# Patient Record
Sex: Male | Born: 1945 | Race: Black or African American | Hispanic: No | Marital: Married | Smoking: Former smoker
Health system: Southern US, Community
[De-identification: ages and names within clinical notes are randomized; demographics above are authoritative.]

## PROBLEM LIST (undated history)

## (undated) DIAGNOSIS — C61 Malignant neoplasm of prostate: Secondary | ICD-10-CM

## (undated) DIAGNOSIS — I499 Cardiac arrhythmia, unspecified: Secondary | ICD-10-CM

## (undated) DIAGNOSIS — I456 Pre-excitation syndrome: Secondary | ICD-10-CM

## (undated) DIAGNOSIS — E119 Type 2 diabetes mellitus without complications: Secondary | ICD-10-CM

## (undated) DIAGNOSIS — Z7901 Long term (current) use of anticoagulants: Secondary | ICD-10-CM

## (undated) DIAGNOSIS — D171 Benign lipomatous neoplasm of skin and subcutaneous tissue of trunk: Secondary | ICD-10-CM

## (undated) DIAGNOSIS — I209 Angina pectoris, unspecified: Secondary | ICD-10-CM

## (undated) DIAGNOSIS — I10 Essential (primary) hypertension: Secondary | ICD-10-CM

## (undated) DIAGNOSIS — I4891 Unspecified atrial fibrillation: Secondary | ICD-10-CM

## (undated) DIAGNOSIS — Z978 Presence of other specified devices: Secondary | ICD-10-CM

## (undated) HISTORY — DX: Benign lipomatous neoplasm of skin and subcutaneous tissue of trunk: D17.1

## (undated) HISTORY — DX: Long term (current) use of anticoagulants: Z79.01

## (undated) HISTORY — DX: Essential (primary) hypertension: I10

## (undated) HISTORY — DX: Pre-excitation syndrome: I45.6

---
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
## 2018-07-02 DIAGNOSIS — C61 Malignant neoplasm of prostate: Secondary | ICD-10-CM

## 2018-07-02 HISTORY — PX: CATARACT EXTRACTION: SUR2

## 2018-07-02 HISTORY — DX: Malignant neoplasm of prostate: C61

## 2018-07-02 HISTORY — PX: RADIOACTIVE SEED IMPLANT: SHX5150

## 2019-04-01 LAB — PROTIME-INR

## 2019-04-17 ENCOUNTER — Ambulatory Visit: Payer: Self-pay | Admitting: General Surgery

## 2019-05-04 ENCOUNTER — Telehealth: Payer: Self-pay

## 2019-05-04 NOTE — Telephone Encounter (Signed)
NOTES ON FILE  ° SENT REFERRAL TO SCHEDULING °

## 2019-05-04 NOTE — Telephone Encounter (Signed)
ERROR

## 2019-05-13 ENCOUNTER — Other Ambulatory Visit: Payer: Self-pay | Admitting: Urology

## 2019-05-13 DIAGNOSIS — C61 Malignant neoplasm of prostate: Secondary | ICD-10-CM

## 2019-05-27 ENCOUNTER — Encounter (HOSPITAL_COMMUNITY)
Admission: RE | Admit: 2019-05-27 | Discharge: 2019-05-27 | Disposition: A | Discharge status: Home / Self Care | Payer: Medicare Other | Source: Ambulatory Visit | Attending: Urology | Admitting: Urology

## 2019-05-27 ENCOUNTER — Other Ambulatory Visit: Payer: Self-pay

## 2019-05-27 DIAGNOSIS — C61 Malignant neoplasm of prostate: Secondary | ICD-10-CM | POA: Diagnosis present

## 2019-05-27 MED ORDER — TECHNETIUM TC 99M MEDRONATE IV KIT
22.0000 | PACK | Freq: Once | INTRAVENOUS | Status: AC | PRN
  Administered 2019-05-27: 22 via INTRAVENOUS

## 2019-06-02 ENCOUNTER — Other Ambulatory Visit: Payer: Self-pay | Admitting: Urology

## 2019-06-05 ENCOUNTER — Encounter: Payer: Self-pay | Admitting: Internal Medicine

## 2019-06-05 ENCOUNTER — Ambulatory Visit (INDEPENDENT_AMBULATORY_CARE_PROVIDER_SITE_OTHER): Payer: Medicare Other | Admitting: Internal Medicine

## 2019-06-05 ENCOUNTER — Ambulatory Visit (INDEPENDENT_AMBULATORY_CARE_PROVIDER_SITE_OTHER): Payer: Medicare Other | Admitting: Pharmacist

## 2019-06-05 ENCOUNTER — Other Ambulatory Visit: Payer: Self-pay

## 2019-06-05 VITALS — BP 98/60 | HR 101 | Ht 73.0 in | Wt 164.4 lb

## 2019-06-05 DIAGNOSIS — Z79899 Other long term (current) drug therapy: Secondary | ICD-10-CM | POA: Diagnosis not present

## 2019-06-05 DIAGNOSIS — Z01810 Encounter for preprocedural cardiovascular examination: Secondary | ICD-10-CM

## 2019-06-05 DIAGNOSIS — I4891 Unspecified atrial fibrillation: Secondary | ICD-10-CM

## 2019-06-05 DIAGNOSIS — I456 Pre-excitation syndrome: Secondary | ICD-10-CM | POA: Diagnosis not present

## 2019-06-05 DIAGNOSIS — Z7901 Long term (current) use of anticoagulants: Secondary | ICD-10-CM

## 2019-06-05 DIAGNOSIS — I480 Paroxysmal atrial fibrillation: Secondary | ICD-10-CM | POA: Insufficient documentation

## 2019-06-05 DIAGNOSIS — I10 Essential (primary) hypertension: Secondary | ICD-10-CM

## 2019-06-05 LAB — POCT INR: INR: 1.1 — AB (ref 2.0–3.0)

## 2019-06-05 MED ORDER — APIXABAN 5 MG PO TABS: 5.0000 mg | ORAL_TABLET | Freq: Two times a day (BID) | ORAL | 11 refills | Status: DC

## 2019-06-05 MED ORDER — METOPROLOL SUCCINATE ER 25 MG PO TB24: 25.0000 mg | ORAL_TABLET | Freq: Every day | ORAL | 3 refills | Status: DC

## 2019-06-05 NOTE — Patient Instructions (Addendum)
Medication Instructions:  STOP- NIFEdipine START- Metoprolol Succinate 25 mg by mouth daily  STOP-Warfarin START-Eliquis 5mg  twice daily (every 12 hours)  *If you need a refill on your cardiac medications before your next appointment, please call your pharmacy*  Lab Work: BMET next week  Testing/Procedures: None Ordered  Follow-Up: At [facility], you and your health needs are our priority.  As part of our continuing mission to provide you with exceptional heart care, we have created designated Provider Care Teams.  These Care Teams include your primary Cardiologist (physician) and Advanced Practice Providers (APPs -  Physician Assistants and Nurse Practitioners) who all work together to provide you with the care you need, when you need it.  Your next appointment:   1 month(s) on January 8 @ 3pm  The format for your next appointment:   In Person  Provider:   Raliegh Ip Mali Hilty, MD

## 2019-06-07 ENCOUNTER — Encounter: Payer: Self-pay | Admitting: Internal Medicine

## 2019-06-07 NOTE — Progress Notes (Signed)
OFFICE NOTE  Chief Complaint:  Establish cardiologist   Primary Care Physician: Nathan Rasmussen, MD  HPI:  Nathan [name] is a 73 y.o. 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  He was not followed by cardiologist rather was seen by internal medicine in [place].  He recently moved to the area to be with his daughter.  He also has a history of alcohol abuse in the past which sounded fairly heavy.  Recently was diagnosed with prostate cancer and is considering treatment options.  There is also diabetes and hypertension.  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  His medical providers name was Dr. Sandie [name] at [facility] in [place] - phone # [phone].  He had been maintained on warfarin and reported compliance with the medication however his dose was quite low at only 1 mg daily.  His daughter reports that since she has been taking care of him here in [place] which is for several months he has been alcohol free.  Overall Nathan [name] is without complaints.  He denies any chest pain or worsening shortness of breath.  Recently he was having issues with hypotension and his primary care provider discontinued a number of medications including his beta-blocker.  He remains on lisinopril and nifedipine.  He was previously on Toprol-XL 25 mg daily.  PMHx:  Past Medical History:  Diagnosis Date  . Atrial fibrillation ([place])   . Cancer of prostate ([place])   . Chronic anticoagulation   . Diabetes ([place])   . HTN (hypertension)   . Lipoma of chest wall   . WPW (Wolff-Parkinson-White syndrome)    No prior ablation    Past Surgical History:  Procedure Laterality Date  . CATARACT EXTRACTION      FAMHx:  Family History  Problem Relation Age of Onset  . Cancer - Lung Mother     SOCHx:   reports that he quit smoking about 4 months  ago. His smoking use included cigarettes. He has quit using smokeless tobacco. He reports previous alcohol use. No history on file for drug.  ALLERGIES:  No Known Allergies  ROS: Pertinent items noted in HPI and remainder of comprehensive ROS otherwise negative.  HOME MEDS: Current Outpatient Medications on File Prior to Visit  Medication Sig Dispense Refill  . folic acid (FOLVITE) 1 MG tablet Take 1 tablet by mouth daily.    Marland Kitchen lisinopril (ZESTRIL) 20 MG tablet Take 20 mg by mouth daily.    . magnesium oxide (MAG-OX) 400 MG tablet Take 1 tablet by mouth daily.    . Prenatal Vit-Fe Fumarate-FA (PREPLUS) 27-1 MG TABS Take 1 tablet by mouth daily.    . tamsulosin (FLOMAX) 0.4 MG CAPS capsule Take 0.4 mg by mouth at bedtime.    . vitamin B-12 (CYANOCOBALAMIN) 1000 MCG tablet Take 1,000 mcg by mouth daily.     No current facility-administered medications on file prior to visit.     LABS/IMAGING: No results found for this or any previous visit (from the past 48 hour(s)). No results found.  LIPID PANEL: No results found for: CHOL, TRIG, HDL, CHOLHDL, VLDL, LDLCALC, LDLDIRECT   WEIGHTS: Wt Readings from Last 3 Encounters:  06/05/19 164 lb 6.4 oz (74.6 kg)    VITALS: BP 98/60   Pulse (!) 101   Ht 6\' 1"  (1.854 m)  Wt 164 lb 6.4 oz (74.6 kg)   BMI 21.69 kg/m   EXAM: General appearance: alert, no distress and Thin Neck: no carotid bruit, no JVD and thyroid not enlarged, symmetric, no tenderness/mass/nodules Lungs: clear to auscultation bilaterally Heart: Regular tachycardia, no murmur Abdomen: soft, non-tender; bowel sounds normal; no masses,  no organomegaly Extremities: extremities normal, atraumatic, no cyanosis or edema Pulses: 2+ and symmetric Skin: Skin color, texture, turgor normal. No rashes or lesions Neurologic: Grossly normal Psych: Pleasant  EKG: Sinus tachycardia at 101- personally reviewed  ASSESSMENT: 1. Reported history of A. fib with WPW 2.  Anticoagulated on warfarin 3. History of chronic alcohol abuse-now abstinent 4. Chest wall lipoma 5. Prostate cancer 6. Hypertension 7. Type 2 diabetes-diet controlled, A1c 6.0  PLAN: 1.   Mr. Nathan [name] is establishing cardiac care in [place].  He has a reported history of A. fib with WPW however the EKG is not classic for WPW.  I wonder if he had an A. fib with aberrancy or wide-complex in the past.  He did have a wallet card warning of this.  He has been anticoagulated on warfarin however low dose.  INR today was just over 1 therefore he was getting no benefit of the medication.  This may have been related to dose changes that took into account his alcohol use in the past which was discontinued.  He also likely is eating better and has more likely higher vitamin K levels.  Nonetheless, I think it would be safer for him to be on a direct oral anticoagulant and would recommend starting Eliquis today 5 mg twice daily.  We will reassess his kidney function and adjust the medical dose accordingly.  He is being contemplated for chest wall lipoma resection.  I would consider this elective as he has no impairment with it although it is rather large.  From my standpoint it would be okay for him to discontinue the Eliquis 3 days prior to the procedure and restarted afterwards.  Finally he has a diagnosis of prostate cancer.  They are considering possible radical prostatectomy.  The same recommendations apply for that.  We will try to get records from his primary care provider to see if there is any other additional cardiac history.  Blood pressure is low today.  I advised him to discontinue the Procardia to allow a little higher blood pressure and we will restart low-dose Toprol-XL 25 mg daily to help with his sinus tachycardia and in the event that he has any breakthrough or recurrent A. fib or tachyarrhythmias related to WPW.  Thanks again for the kind referral.  Follow-up with me in 2 months.  Pixie Casino, MD, [facility], [place] Director of the Advanced Lipid Disorders &  Cardiovascular Risk Reduction Clinic Diplomate of the American Board of Clinical Lipidology Attending Cardiologist  Direct Dial: [phone]  Fax: [phone]  Website:  [email or web address] 06/07/2019, 3:05 PM

## 2019-06-10 LAB — BASIC METABOLIC PANEL
BUN/Creatinine Ratio: 16 (ref 10–24)
BUN: 16 mg/dL (ref 8–27)
CO2: 27 mmol/L (ref 20–29)
Calcium: 10.2 mg/dL (ref 8.6–10.2)
Chloride: 105 mmol/L (ref 96–106)
Creatinine, Ser: 1.01 mg/dL (ref 0.76–1.27)
GFR calc Af Amer: 84 mL/min/{1.73_m2} (ref >59)
GFR calc non Af Amer: 72 mL/min/{1.73_m2} (ref >59)
Glucose: 85 mg/dL (ref 65–99)
Potassium: 5 mmol/L (ref 3.5–5.2)
Sodium: 147 mmol/L — ABNORMAL HIGH (ref 134–144)

## 2019-06-12 ENCOUNTER — Telehealth: Payer: Self-pay | Admitting: Internal Medicine

## 2019-06-12 NOTE — Telephone Encounter (Signed)
New message  'Pt c/o medication issue:  1. Name of Medication: Eliquis  2. How are you currently taking this medication (dosage and times per day)? As written  3. Are you having a reaction (difficulty breathing--STAT)? no  4. What is your medication issue? Pharmacy calling to verify if patient is  taking Eliquis and Warfarin

## 2019-06-12 NOTE — Telephone Encounter (Signed)
Pt was switched to Eliquis 5 mg bid at MD appt earlier this month.  LMOM at [facility] to discontinue warfarin rx.

## 2019-06-15 ENCOUNTER — Encounter (HOSPITAL_BASED_OUTPATIENT_CLINIC_OR_DEPARTMENT_OTHER): Payer: Self-pay

## 2019-06-15 ENCOUNTER — Ambulatory Visit (HOSPITAL_BASED_OUTPATIENT_CLINIC_OR_DEPARTMENT_OTHER): Admit: 2019-06-15 | Payer: Medicare Other | Admitting: Urology

## 2019-06-15 SURGERY — CYSTOSCOPY
Anesthesia: General

## 2019-07-07 ENCOUNTER — Other Ambulatory Visit: Payer: Self-pay

## 2019-07-07 ENCOUNTER — Ambulatory Visit
Admission: RE | Admit: 2019-07-07 | Discharge: 2019-07-07 | Disposition: A | Discharge status: Home / Self Care | Payer: Medicare Other | Source: Ambulatory Visit | Attending: Radiation Oncology | Admitting: Radiation Oncology

## 2019-07-07 ENCOUNTER — Encounter: Payer: Self-pay | Admitting: Radiation Oncology

## 2019-07-07 VITALS — Ht 73.0 in | Wt 164.0 lb

## 2019-07-07 DIAGNOSIS — C61 Malignant neoplasm of prostate: Secondary | ICD-10-CM

## 2019-07-07 NOTE — Progress Notes (Signed)
Radiation Oncology         [phone] ________________________________  Initial outpatient Consultation - Conducted via Telephone due to current COVID-19 concerns for limiting patient exposure  Name: Nathan [name] MRN: [id number]  Date: 07/07/2019  DOB: 1946-03-18  [id number], Nathan Munroe, MD  Raynelle Bring, MD   REFERRING PHYSICIAN: Raynelle Bring, MD  DIAGNOSIS: 74 y.o. 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    ICD-10-CM   1. Malignant neoplasm of prostate ([place])  C61     HISTORY OF PRESENT ILLNESS: Nathan [name] is a 74 y.o. male with a diagnosis of prostate cancer. He initially developed acute urinary retention and renal failure while living in [place]. This was treated with foley catheter placement, which remains in place now and he subsequently moved to [place] with his daughter. He presented to Dr. Alinda Money in 03/2019 to establish local urologic care and digital rectal examination performed at that time was abnormal with left-sided firmness/nodularity. A PSA performed that same day was elevated at 12.2.  He had urodynamc studies (UDS) in 03/2019 to further evaluate bladder function and was found to have neurogenic bladder, likely secondary to chronic BPH with BOO.  He and Dr. Alinda Money have discussed the potential for SP tube placement but to date, he has elected to continue with the indwelling foley catheter.  The patient proceeded to transrectal ultrasound with 12 biopsies of the prostate on 04/28/2019.  The prostate volume measured 196.8 cc.  Out of 12 core biopsies, 8 were positive, including all 6 left cores.  The maximum Gleason score was 3+4, and this was seen in all 8 positive cores-- in the left base lateral (PNI), left mid lateral (PNI), left apex lateral, left base (PNI), left mid, left apex (small focus, PNI), right base (small focus, PNI), and right base lateral (small focus, PNI).  A CT A/P was performed on 05/27/2019 for  disease staging which showed extreme prostatomegaly, measuring 8.6 cm, with a dominant hyper-enhancing lesion of the left aspect of the median lobe measuring 2.3 cm but no evidence of visceral metastatic disease. A bone scan performed the same day was negative for osseous metastases.  In early December, he developed painless, intermittent gross hematuria which was evaluated with cystoscopy on 06/16/19 with no findings suspicious for bladder tumor.  The hematuria was felt most likely related to the severe BPH with chronic BOO and indwelling foley catheter irritation.  The patient reviewed the biopsy results with his urologist and he has kindly been referred today for discussion of potential radiation treatment options.  PREVIOUS RADIATION THERAPY: No  PAST MEDICAL HISTORY:  Past Medical History:  Diagnosis Date   Atrial fibrillation (H[facility])    Cancer of prostate ([place])    Chronic anticoagulation    Diabetes ([facility])    HTN (hypertension)    Lipoma of chest wall    WPW (Wolff-Parkinson-White syndrome)    No prior ablation      PAST SURGICAL HISTORY: Past Surgical History:  Procedure Laterality Date   CATARACT EXTRACTION      FAMILY HISTORY:  Family History  Problem Relation Age of Onset   Lung cancer Mother    Cervical cancer Sister    Breast cancer Neg Hx    Pancreatic cancer Neg Hx    Colon cancer Neg Hx     SOCIAL HISTORY:  Social History   Socioeconomic History   Marital status: Married    Spouse name: Not on  file   Number of children: 9   Years of education: Not on file   Highest education level: Not on file  Occupational History   Not on file  Tobacco Use   Smoking status: Former Smoker    Packs/day: 0.50    Years: 60.00    Pack years: 30.00    Types: Cigarettes    Quit date: 01/31/2019    Years since quitting: 0.4   Smokeless tobacco: Former Network engineer and Sexual Activity   Alcohol use: Not Currently   Drug use: Never   Sexual  activity: Not Currently  Other Topics Concern   Not on file  Social History Narrative   Not on file   Social Determinants of Health   Financial Resource Strain:    Difficulty of Paying Living Expenses: Not on file  Food Insecurity:    Worried About Charity fundraiser in the Last Year: Not on file   [employer] of Food in the Last Year: Not on file  Transportation Needs:    Lack of Transportation (Medical): Not on file   Lack of Transportation (Non-Medical): Not on file  Physical Activity:    Days of Exercise per Week: Not on file   Minutes of Exercise per Session: Not on file  Stress:    Feeling of Stress : Not on file  Social Connections:    Frequency of Communication with Friends and Family: Not on file   Frequency of Social Gatherings with Friends and Family: Not on file   Attends Religious Services: Not on file   Active Member of Clubs or Organizations: Not on file   Attends Archivist Meetings: Not on file   Marital Status: Not on file  Intimate Partner Violence:    Fear of Current or Ex-Partner: Not on file   Emotionally Abused: Not on file   Physically Abused: Not on file   Sexually Abused: Not on file    ALLERGIES: Patient has no known allergies.  MEDICATIONS:  Current Outpatient Medications  Medication Sig Dispense Refill   apixaban (ELIQUIS) 5 MG TABS tablet Take 1 tablet (5 mg total) by mouth 2 (two) times daily. 60 tablet 11   folic acid (FOLVITE) 1 MG tablet Take 1 tablet by mouth daily.     ketorolac (ACULAR) 0.4 % SOLN Place 1 drop into the right eye 4 (four) times daily.     lisinopril (ZESTRIL) 20 MG tablet Take 20 mg by mouth daily.     magnesium oxide (MAG-OX) 400 MG tablet Take 1 tablet by mouth daily.     metoprolol succinate (TOPROL XL) 25 MG 24 hr tablet Take 1 tablet (25 mg total) by mouth daily. 90 tablet 3   Multiple Vitamins-Minerals (MULTIVITAMIN ADULT PO) Take by mouth.     ofloxacin (OCUFLOX) 0.3 %  ophthalmic solution Place 1 drop into the right eye 4 (four) times daily.     prednisoLONE acetate (PRED FORTE) 1 % ophthalmic suspension Place 1 drop into the right eye 4 (four) times daily.     vitamin B-12 (CYANOCOBALAMIN) 1000 MCG tablet Take 1,000 mcg by mouth daily.     VITAMIN D, CHOLECALCIFEROL, PO Take by mouth.     No current facility-administered medications for this encounter.    REVIEW OF SYSTEMS:  On review of systems, the patient reports that he is doing well overall. He denies any chest pain, shortness of breath, cough, fevers, chills, night sweats, unintended weight changes. He denies any bowel  disturbances, and denies abdominal pain, nausea or vomiting. He denies any new musculoskeletal or joint aches or pains. His IPSS was 0 with foley catheter in place. He reports his urine is clear yellow without sediment or mucous, but he adds that there is occasional blood in the collection bag after he showers. His SHIM was 1, indicating he has severe erectile dysfunction. A complete review of systems is obtained and is otherwise negative.  PHYSICAL EXAM:  Wt Readings from Last 3 Encounters:  07/07/19 164 lb (74.4 kg)  06/05/19 164 lb 6.4 oz (74.6 kg)   Temp Readings from Last 3 Encounters:  No data found for Temp   BP Readings from Last 3 Encounters:  06/05/19 98/60   Pulse Readings from Last 3 Encounters:  06/05/19 (!) 101   Pain Assessment Pain Score: 0-No pain/10  Physical exam not performed in light of telephone encounter.   KPS = 90  100 - Normal; no complaints; no evidence of disease. 90   - Able to carry on normal activity; minor signs or symptoms of disease. 80   - Normal activity with effort; some signs or symptoms of disease. 69   - Cares for self; unable to carry on normal activity or to do active work. 60   - Requires occasional assistance, but is able to care for most of his personal needs. 50   - Requires considerable assistance and frequent medical  care. 55   - Disabled; requires special care and assistance. 11   - Severely disabled; hospital admission is indicated although death not imminent. 61   - Very sick; hospital admission necessary; active supportive treatment necessary. 10   - Moribund; fatal processes progressing rapidly. 0     - Dead  Karnofsky DA, Abelmann Erda, Craver LS and Burchenal [facility] [phone]) The use of the nitrogen mustards in the palliative treatment of carcinoma: with particular reference to bronchogenic carcinoma Cancer 1 634-56  LABORATORY DATA:  No results found for: WBC, HGB, HCT, MCV, PLT Lab Results  Component Value Date   NA 147 (H) 06/09/2019   K 5.0 06/09/2019   CL 105 06/09/2019   CO2 27 06/09/2019   No results found for: ALT, AST, GGT, ALKPHOS, BILITOT   RADIOGRAPHY: No results found.    IMPRESSION/PLAN: This visit was conducted via Telephone to spare the patient unnecessary potential exposure in the healthcare setting during the current COVID-19 pandemic. 1. 74 y.o. gentleman with Stage cT2 adenocarcinoma of the prostate with Gleason Score of 3+4, and PSA of 12.2. We discussed the patient's workup and outlined the nature of prostate cancer in this setting. The patient's T stage, Gleason's score, and PSA put him into the intermediate risk group. Accordingly, he is eligible for a variety of potential treatment options including prostatectomy, brachytherapy or 5.5-8 weeks of external radiation. We discussed the available radiation techniques, and focused on the details and logistics of delivery. The patient is not a candidate for brachytherapy with a prostate volume of 196.8 gm.  Therefore we focused our discussion on the risks, benefits, short and long-term effects associated with daily external beam radiotherapy and compared and contrasted these with prostatectomy. We discussed the role of SpaceOAR gel in reducing the rectal toxicity associated with radiotherapy. He reports that they discussed the role of ADT  in the treatment of intermediate risk prostate cancer with Dr. Alinda Money and have decided to proceed with radiation alone at this point and reserve ADT for use only if the PSA continues to rise  despite treatment.  He and his daughter were encouraged to ask questions that were answered to their stated satisfaction.  At the end of the conversation, the patient is interested in moving forward with 5.5 weeks of external beam therapy without ADT. We will share our discussion with Dr. Alinda Money and move forward with coordinating a follow up for fiducial markers and SpaceOAR gel placement, prior to simulation, to reduce rectal toxicity from radiotherapy. He appears to have a good understanding of his disease and our treatment recommendations which are of curative intent and is comfortable and in agreement with the stated plan.  We will proceed with  treatment planning accordingly, in anticipation of beginning IMRT in the near future.  The patient is currently living with his daughter who is working full time and he does not drive himself.  Therefore, they have requested assistance with transportation to and from daily radiation visits so I will connect them with our transportation coordinator to make the appropriate arrangements.  Given current concerns for patient exposure during the COVID-19 pandemic, this encounter was conducted via telephone. The patient was notified in advance and was offered a MyChart meeting to allow for face to face communication but unfortunately reported that he did not have the appropriate resources/technology to support such a visit and instead preferred to proceed with telephone consult. The patient has given verbal consent for this type of encounter. The time spent during this encounter was 60 minutes. The attendants for this meeting include Tyler Pita MD, Ashlyn Bruning PA-C, [place], patient, Wyn Flam, his wife, and his daughter. During the encounter, Tyler Pita MD, Ashlyn Bruning PA-C, and scribe, Wilburn Mylar were located at [place].  Patient, Benz Chance, his wife, and his daughter were located at home.    Nicholos Johns, PA-C    Tyler Pita, MD  [facility] Direct Dial: [phone]   Fax: [phone] [email or web address]   Skype   [email or web address]  This document serves as a record of services personally performed by Tyler Pita, MD and Freeman Caldron, PA-C. It was created on their behalf by Wilburn Mylar, a trained medical scribe. The creation of this record is based on the scribe's personal observations and the provider's statements to them. This document has been checked and approved by the attending provider.

## 2019-07-07 NOTE — Progress Notes (Signed)
GU Location of Tumor / Histology: prostatic adenocarcinoma  If Prostate Cancer, Gleason Score is (3 + 4) and PSA is (12.20). Prostate volume: 196.8 cc  Lakendric Papalia was residing in [place]. Patient's daughter became concerned about his health and travel to [place] on December 2. She found patient unable to void with a distended abdomen. Ambulance called and patient found to be in kidney failure.  Biopsies of prostate (if applicable) revealed:   Past/Anticipated interventions by urology, if any: prostate biopsy, foley management, referral to Dr. [name]  Past/Anticipated interventions by medical oncology, if any: no  Weight changes, if any: down 40 lb in the past year since wife left  Bowel/Bladder complaints, if any: IPSS 0. SHIM 1. Foley catheter in place. Urine clear yellow without sediment or mucous. Occasional blood in collection bag after showers. Denies pain or bladder spasm. Denies any bowel complaints.   Nausea/Vomiting, if any: no  Pain issues, if any:  no  SAFETY ISSUES:  Prior radiation? no  Pacemaker/ICD? no  Possible current pregnancy? no, male patient  Is the patient on methotrexate? no  Current Complaints / other details:

## 2019-07-07 NOTE — Progress Notes (Signed)
See progress note under physician encounter. 

## 2019-07-10 ENCOUNTER — Ambulatory Visit (INDEPENDENT_AMBULATORY_CARE_PROVIDER_SITE_OTHER): Payer: Medicare Other | Admitting: Internal Medicine

## 2019-07-10 ENCOUNTER — Other Ambulatory Visit: Payer: Self-pay

## 2019-07-10 ENCOUNTER — Encounter: Payer: Self-pay | Admitting: Internal Medicine

## 2019-07-10 VITALS — BP 133/73 | HR 88 | Temp 97.1°F | Ht 73.0 in | Wt 169.0 lb

## 2019-07-10 DIAGNOSIS — I456 Pre-excitation syndrome: Secondary | ICD-10-CM

## 2019-07-10 DIAGNOSIS — Z7901 Long term (current) use of anticoagulants: Secondary | ICD-10-CM

## 2019-07-10 DIAGNOSIS — I10 Essential (primary) hypertension: Secondary | ICD-10-CM

## 2019-07-10 DIAGNOSIS — I4891 Unspecified atrial fibrillation: Secondary | ICD-10-CM | POA: Diagnosis not present

## 2019-07-10 NOTE — Patient Instructions (Signed)
Medication Instructions:  NO CHANGES *If you need a refill on your cardiac medications before your next appointment, please call your pharmacy*  Lab Work: If you have labs (blood work) drawn today and your tests are completely normal, you will receive your results only by: Marland Kitchen MyChart Message (if you have MyChart) OR . A paper copy in the mail If you have any lab test that is abnormal or we need to change your treatment, we will call you to review the results.  Follow-Up: At [facility], you and your health needs are our priority.  As part of our continuing mission to provide you with exceptional heart care, we have created designated Provider Care Teams.  These Care Teams include your primary Cardiologist (physician) and Advanced Practice Providers (APPs -  Physician Assistants and Nurse Practitioners) who all work together to provide you with the care you need, when you need it.  Your next appointment:   6 month(s)  The format for your next appointment:   Either In Person or Virtual  Provider:   You may see No primary care provider on file. or one of the following Advanced Practice Providers on your designated Care Team:    Almyra Deforest, PA-C  Fabian Sharp, PA-C or   Roby Lofts, [place]

## 2019-07-10 NOTE — Progress Notes (Signed)
OFFICE NOTE  Chief Complaint:  Follow-up A. Fib  Primary Care Physician: [name], MD  HPI:  Nathan [name] is a 74 y.o. 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  He was not followed by cardiologist rather was seen by internal medicine in [place].  He recently moved to the area to be with his daughter.  He also has a history of alcohol abuse in the past which sounded fairly heavy.  Recently was diagnosed with prostate cancer and is considering treatment options.  There is also diabetes and hypertension.  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  His medical providers name was Dr. Sandie Ano at [facility] in [place] - phone # [phone].  He had been maintained on warfarin and reported compliance with the medication however his dose was quite low at only 1 mg daily.  His daughter reports that since she has been taking care of him here in [place] which is for several months he has been alcohol free.  Overall Mr. Helmich is without complaints.  He denies any chest pain or worsening shortness of breath.  Recently he was having issues with hypotension and his primary care provider discontinued a number of medications including his beta-blocker.  He remains on lisinopril and nifedipine.  He was previously on Toprol-XL 25 mg daily.  07/10/2019  Mr. Laduke returns today for follow-up of his A. fib.  Overall he is doing well now on Eliquis.  He says he does not feel any different on it.  He had no bleeding issues with it.  I switched him to Toprol-XL which he previously taken to help with any possible breakthrough of WPW or A. fib.  I have also taken off of the Procardia.  Blood pressure is well controlled today 133/73.  He is working with both urology and oncology as far as treatment for diagnosed prostate cancer.  He is likely  getting get radiation seed implants.  He is also going to be deferring his lipoma resection.  If necessary at this point he could stop his Eliquis 2 to 3 days prior to that and restarted afterwards when bleeding risk is low.  PMHx:  Past Medical History:  Diagnosis Date  . Atrial fibrillation ([place])   . Cancer of prostate ([place])   . Chronic anticoagulation   . Diabetes ([place])   . HTN (hypertension)   . Lipoma of chest wall   . WPW (Wolff-Parkinson-White syndrome)    No prior ablation    Past Surgical History:  Procedure Laterality Date  . CATARACT EXTRACTION      FAMHx:  Family History  Problem Relation Age of Onset  . Lung cancer Mother   . Cervical cancer Sister   . Breast cancer Neg Hx   . Pancreatic cancer Neg Hx   . Colon cancer Neg Hx     SOCHx:   reports that he quit smoking about 5 months ago. His smoking use included cigarettes. He has a 30.00 pack-year smoking history. He has quit using smokeless tobacco. He reports previous alcohol use. He reports that he does not use drugs.  ALLERGIES:  No Known Allergies  ROS: Pertinent items noted in HPI and remainder of comprehensive ROS otherwise negative.  HOME MEDS: Current Outpatient Medications on File Prior to Visit  Medication Sig Dispense Refill  .  apixaban (ELIQUIS) 5 MG TABS tablet Take 1 tablet (5 mg total) by mouth 2 (two) times daily. 60 tablet 11  . folic acid (FOLVITE) 1 MG tablet Take 1 tablet by mouth daily.    Marland Kitchen ketorolac (ACULAR) 0.4 % SOLN Place 1 drop into the right eye 4 (four) times daily.    Marland Kitchen lisinopril (ZESTRIL) 20 MG tablet Take 20 mg by mouth daily.    . magnesium oxide (MAG-OX) 400 MG tablet Take 1 tablet by mouth daily.    . metoprolol succinate (TOPROL XL) 25 MG 24 hr tablet Take 1 tablet (25 mg total) by mouth daily. 90 tablet 3  . Multiple Vitamins-Minerals (MULTIVITAMIN ADULT PO) Take by mouth.    Marland Kitchen ofloxacin (OCUFLOX) 0.3 % ophthalmic solution Place 1 drop into the right eye 4 (four)  times daily.    . prednisoLONE acetate (PRED FORTE) 1 % ophthalmic suspension Place 1 drop into the right eye 4 (four) times daily.    . vitamin B-12 (CYANOCOBALAMIN) 1000 MCG tablet Take 1,000 mcg by mouth daily.    Marland Kitchen VITAMIN D, CHOLECALCIFEROL, PO Take by mouth.     No current facility-administered medications on file prior to visit.    LABS/IMAGING: No results found for this or any previous visit (from the past 48 hour(s)). No results found.  LIPID PANEL: No results found for: CHOL, TRIG, HDL, CHOLHDL, VLDL, LDLCALC, LDLDIRECT   WEIGHTS: Wt Readings from Last 3 Encounters:  07/10/19 169 lb (76.7 kg)  07/07/19 164 lb (74.4 kg)  06/05/19 164 lb 6.4 oz (74.6 kg)    VITALS: BP 133/73   Pulse 88   Temp (!) 97.1 F (36.2 C)   Ht 6\' 1"  (1.854 m)   Wt 169 lb (76.7 kg)   SpO2 98%   BMI 22.30 kg/m   EXAM: General appearance: alert, no distress and Thin Neck: no carotid bruit, no JVD and thyroid not enlarged, symmetric, no tenderness/mass/nodules Lungs: clear to auscultation bilaterally Heart: Regular tachycardia, no murmur Abdomen: soft, non-tender; bowel sounds normal; no masses,  no organomegaly Extremities: extremities normal, atraumatic, no cyanosis or edema Pulses: 2+ and symmetric Skin: Skin color, texture, turgor normal. No rashes or lesions Neurologic: Grossly normal Psych: Pleasant  EKG: Deferred  ASSESSMENT: 1. Reported history of A. fib with WPW 2. Anticoagulated on Eliquis 3. History of chronic alcohol abuse-now abstinent 4. Chest wall lipoma 5. Prostate cancer 6. Hypertension 7. Type 2 diabetes-diet controlled, A1c 6.0  PLAN: 1.   Mr. Kaczynski seems to be doing well now on Eliquis.  He has rate control at this point.  Blood pressure is well controlled.  He plans to undergo radiation seed implants for prostate cancer may have a chest wall lipoma removed.  Other than that he seems to be doing well.  He can follow-up with me in 6 months or sooner as  necessary.  Pixie Casino, MD, [facility], [place] Director of the Advanced Lipid Disorders &  Cardiovascular Risk Reduction Clinic Diplomate of the American Board of Clinical Lipidology Attending Cardiologist  Direct Dial: [phone]  Fax: [phone]  Website:  [email or web address] 07/10/2019, 3:03 PM

## 2019-07-13 ENCOUNTER — Encounter: Payer: Self-pay | Admitting: Medical Oncology

## 2019-07-21 ENCOUNTER — Telehealth: Payer: Self-pay | Admitting: *Deleted

## 2019-07-21 ENCOUNTER — Telehealth: Payer: Self-pay | Admitting: Medical Oncology

## 2019-07-21 NOTE — Telephone Encounter (Signed)
Returned patient's wife's phone call, spoke with patient's wife- Theda Belfast

## 2019-07-21 NOTE — Telephone Encounter (Signed)
Spoke with [name], daughter to follow up post consult with Dr. [name] 07/07/19.  He has chosen 5 1/2 weeks of radiation. I asked if he has been scheduled for gold markers/SpaceOar with Dr. Alinda Money and she states, no. She is concerned. I will follow up with Enid Derry and call he back with an update. We discussed CT simulation, appointments and what takes place. She was very appreciative of the call and help with getting appointment. I gave her my contact information and asked her to call me with questions or concerns. She voiced understanding.

## 2019-07-23 ENCOUNTER — Telehealth: Payer: Self-pay | Admitting: *Deleted

## 2019-07-23 NOTE — Telephone Encounter (Signed)
Called patient to inform of fid. markers and space oar placement on 08-20-19 @ [facility] and his sim on 08-25-19 @ 11 am @ Dr. Johny She[facility], lvm for a return call

## 2019-07-23 NOTE — Telephone Encounter (Signed)
xxxxx 

## 2019-07-24 ENCOUNTER — Other Ambulatory Visit: Payer: Self-pay | Admitting: Urology

## 2019-07-24 DIAGNOSIS — C61 Malignant neoplasm of prostate: Secondary | ICD-10-CM

## 2019-08-12 ENCOUNTER — Telehealth: Payer: Self-pay | Admitting: *Deleted

## 2019-08-12 NOTE — Telephone Encounter (Signed)
CALLED PATIENT'S DAUGHTER TOYA NATHANIEL TO INFORM THAT MRI FOR HER DAD HAS BEEN MOVED TO 08-26-19- ARRIVAL TIME- 4:30 PM @ [facility], SPOKE WITH PATIENT'S DAUGHTER  AND SHE IS AWARE OF THE CHANGE AND IS GOOD WITH IT.

## 2019-08-21 ENCOUNTER — Ambulatory Visit (HOSPITAL_COMMUNITY): Payer: PRIVATE HEALTH INSURANCE

## 2019-08-24 ENCOUNTER — Telehealth: Payer: Self-pay | Admitting: *Deleted

## 2019-08-24 NOTE — Telephone Encounter (Signed)
CALLED PATIENT'S DAUGHTER - TOYA Lingo TO INFORM OF SIM AND MRI BEING MOVED TO 09-04-19, SPOKE WITH TOYA [place] AND SHE IS AWARE OF THESE APPTS.

## 2019-08-25 ENCOUNTER — Ambulatory Visit: Payer: Medicare Other | Admitting: Radiation Oncology

## 2019-08-26 ENCOUNTER — Ambulatory Visit (HOSPITAL_COMMUNITY): Payer: PRIVATE HEALTH INSURANCE

## 2019-08-31 ENCOUNTER — Encounter: Payer: Self-pay | Admitting: *Deleted

## 2019-09-03 ENCOUNTER — Telehealth: Payer: Self-pay | Admitting: *Deleted

## 2019-09-03 NOTE — Telephone Encounter (Signed)
CALLED PATIENT TO REMIND OF SIM AND MRI FOR 09-04-19, SPOKE WITH PATIENT'S DAUGHTER- TOYA AND SHE IS AWARE OF THESE APPTS.

## 2019-09-04 ENCOUNTER — Ambulatory Visit
Admission: RE | Admit: 2019-09-04 | Discharge: 2019-09-04 | Disposition: A | Discharge status: Home / Self Care | Payer: Medicare Other | Source: Ambulatory Visit | Attending: Radiation Oncology | Admitting: Radiation Oncology

## 2019-09-04 ENCOUNTER — Other Ambulatory Visit: Payer: Self-pay

## 2019-09-04 ENCOUNTER — Ambulatory Visit (HOSPITAL_COMMUNITY)
Admission: RE | Admit: 2019-09-04 | Discharge: 2019-09-04 | Disposition: A | Discharge status: Home / Self Care | Payer: Medicare Other | Source: Ambulatory Visit | Attending: Urology | Admitting: Urology

## 2019-09-04 ENCOUNTER — Encounter: Payer: Self-pay | Admitting: Medical Oncology

## 2019-09-04 DIAGNOSIS — C61 Malignant neoplasm of prostate: Secondary | ICD-10-CM | POA: Diagnosis present

## 2019-09-04 DIAGNOSIS — Z510 Encounter for antineoplastic radiation therapy: Secondary | ICD-10-CM | POA: Insufficient documentation

## 2019-09-04 NOTE — Progress Notes (Signed)
  Radiation Oncology         [phone]) [phone] ________________________________  Name: Nathan [name] MRN: [id number]  Date: 09/04/2019  DOB: [birth year]  SIMULATION AND TREATMENT PLANNING NOTE    ICD-10-CM   1. Malignant neoplasm of prostate ([place])  C61     DIAGNOSIS:  74 y.o. 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  Identity was confirmed.  All relevant records and images related to the planned course of therapy were reviewed.  The patient freely provided informed written consent to proceed with treatment after reviewing the details related to the planned course of therapy. The consent form was witnessed and verified by the simulation staff.  Then, the patient was set-up in a stable reproducible supine position for radiation therapy.  A vacuum lock pillow device was custom fabricated to position his legs in a reproducible immobilized position.  Then, I performed a urethrogram under sterile conditions to identify the prostatic apex.  CT images were obtained.  Surface markings were placed.  The CT images were loaded into the planning software.  Then the prostate target and avoidance structures including the rectum, bladder, bowel and hips were contoured.  Treatment planning then occurred.  The radiation prescription was entered and confirmed.  A total of one complex treatment devices was fabricated. I have requested : Intensity Modulated Radiotherapy (IMRT) is medically necessary for this case for the following reason:  Rectal sparing.Marland Kitchen  PLAN:  The patient will receive 70 Gy in 28 fractions.  ________________________________  Sheral Apley [name], M.D.

## 2019-09-07 DIAGNOSIS — Z510 Encounter for antineoplastic radiation therapy: Secondary | ICD-10-CM | POA: Diagnosis not present

## 2019-09-16 ENCOUNTER — Encounter: Payer: Self-pay | Admitting: Medical Oncology

## 2019-09-16 ENCOUNTER — Other Ambulatory Visit: Payer: Self-pay

## 2019-09-16 ENCOUNTER — Ambulatory Visit
Admission: RE | Admit: 2019-09-16 | Discharge: 2019-09-16 | Disposition: A | Discharge status: Home / Self Care | Payer: Medicare Other | Source: Ambulatory Visit | Attending: Radiation Oncology | Admitting: Radiation Oncology

## 2019-09-16 DIAGNOSIS — Z510 Encounter for antineoplastic radiation therapy: Secondary | ICD-10-CM | POA: Diagnosis not present

## 2019-09-17 ENCOUNTER — Other Ambulatory Visit: Payer: Self-pay

## 2019-09-17 ENCOUNTER — Ambulatory Visit
Admission: RE | Admit: 2019-09-17 | Discharge: 2019-09-17 | Disposition: A | Discharge status: Home / Self Care | Payer: Medicare Other | Source: Ambulatory Visit | Attending: Radiation Oncology | Admitting: Radiation Oncology

## 2019-09-17 DIAGNOSIS — Z510 Encounter for antineoplastic radiation therapy: Secondary | ICD-10-CM | POA: Diagnosis not present

## 2019-09-18 ENCOUNTER — Other Ambulatory Visit: Payer: Self-pay

## 2019-09-18 ENCOUNTER — Ambulatory Visit
Admission: RE | Admit: 2019-09-18 | Discharge: 2019-09-18 | Disposition: A | Discharge status: Home / Self Care | Payer: Medicare Other | Source: Ambulatory Visit | Attending: Radiation Oncology | Admitting: Radiation Oncology

## 2019-09-18 DIAGNOSIS — Z510 Encounter for antineoplastic radiation therapy: Secondary | ICD-10-CM | POA: Diagnosis not present

## 2019-09-21 ENCOUNTER — Ambulatory Visit
Admission: RE | Admit: 2019-09-21 | Discharge: 2019-09-21 | Disposition: A | Discharge status: Home / Self Care | Payer: Medicare Other | Source: Ambulatory Visit | Attending: Radiation Oncology | Admitting: Radiation Oncology

## 2019-09-21 ENCOUNTER — Other Ambulatory Visit: Payer: Self-pay

## 2019-09-21 DIAGNOSIS — Z510 Encounter for antineoplastic radiation therapy: Secondary | ICD-10-CM | POA: Diagnosis not present

## 2019-09-22 ENCOUNTER — Other Ambulatory Visit: Payer: Self-pay

## 2019-09-22 ENCOUNTER — Ambulatory Visit
Admission: RE | Admit: 2019-09-22 | Discharge: 2019-09-22 | Disposition: A | Discharge status: Home / Self Care | Payer: Medicare Other | Source: Ambulatory Visit | Attending: Radiation Oncology | Admitting: Radiation Oncology

## 2019-09-22 DIAGNOSIS — Z510 Encounter for antineoplastic radiation therapy: Secondary | ICD-10-CM | POA: Diagnosis not present

## 2019-09-23 ENCOUNTER — Other Ambulatory Visit: Payer: Self-pay

## 2019-09-23 ENCOUNTER — Ambulatory Visit
Admission: RE | Admit: 2019-09-23 | Discharge: 2019-09-23 | Disposition: A | Discharge status: Home / Self Care | Payer: Medicare Other | Source: Ambulatory Visit | Attending: Radiation Oncology | Admitting: Radiation Oncology

## 2019-09-23 DIAGNOSIS — Z510 Encounter for antineoplastic radiation therapy: Secondary | ICD-10-CM | POA: Diagnosis not present

## 2019-09-24 ENCOUNTER — Ambulatory Visit
Admission: RE | Admit: 2019-09-24 | Discharge: 2019-09-24 | Disposition: A | Discharge status: Home / Self Care | Payer: Medicare Other | Source: Ambulatory Visit | Attending: Radiation Oncology | Admitting: Radiation Oncology

## 2019-09-24 ENCOUNTER — Other Ambulatory Visit: Payer: Self-pay

## 2019-09-24 DIAGNOSIS — Z510 Encounter for antineoplastic radiation therapy: Secondary | ICD-10-CM | POA: Diagnosis not present

## 2019-09-25 ENCOUNTER — Other Ambulatory Visit: Payer: Self-pay

## 2019-09-25 ENCOUNTER — Ambulatory Visit
Admission: RE | Admit: 2019-09-25 | Discharge: 2019-09-25 | Disposition: A | Discharge status: Home / Self Care | Payer: Medicare Other | Source: Ambulatory Visit | Attending: Radiation Oncology | Admitting: Radiation Oncology

## 2019-09-25 DIAGNOSIS — Z510 Encounter for antineoplastic radiation therapy: Secondary | ICD-10-CM | POA: Diagnosis not present

## 2019-09-28 ENCOUNTER — Ambulatory Visit
Admission: RE | Admit: 2019-09-28 | Discharge: 2019-09-28 | Disposition: A | Discharge status: Home / Self Care | Payer: Medicare Other | Source: Ambulatory Visit | Attending: Radiation Oncology | Admitting: Radiation Oncology

## 2019-09-28 ENCOUNTER — Other Ambulatory Visit: Payer: Self-pay

## 2019-09-28 DIAGNOSIS — Z510 Encounter for antineoplastic radiation therapy: Secondary | ICD-10-CM | POA: Diagnosis not present

## 2019-09-29 ENCOUNTER — Other Ambulatory Visit: Payer: Self-pay

## 2019-09-29 ENCOUNTER — Ambulatory Visit
Admission: RE | Admit: 2019-09-29 | Discharge: 2019-09-29 | Disposition: A | Discharge status: Home / Self Care | Payer: Medicare Other | Source: Ambulatory Visit | Attending: Radiation Oncology | Admitting: Radiation Oncology

## 2019-09-29 DIAGNOSIS — Z510 Encounter for antineoplastic radiation therapy: Secondary | ICD-10-CM | POA: Diagnosis not present

## 2019-09-30 ENCOUNTER — Other Ambulatory Visit: Payer: Self-pay

## 2019-09-30 ENCOUNTER — Ambulatory Visit
Admission: RE | Admit: 2019-09-30 | Discharge: 2019-09-30 | Disposition: A | Discharge status: Home / Self Care | Payer: Medicare Other | Source: Ambulatory Visit | Attending: Radiation Oncology | Admitting: Radiation Oncology

## 2019-09-30 DIAGNOSIS — Z510 Encounter for antineoplastic radiation therapy: Secondary | ICD-10-CM | POA: Diagnosis not present

## 2019-10-01 ENCOUNTER — Ambulatory Visit
Admission: RE | Admit: 2019-10-01 | Discharge: 2019-10-01 | Disposition: A | Discharge status: Home / Self Care | Payer: Medicare Other | Source: Ambulatory Visit | Attending: Radiation Oncology | Admitting: Radiation Oncology

## 2019-10-01 ENCOUNTER — Other Ambulatory Visit: Payer: Self-pay

## 2019-10-01 DIAGNOSIS — Z510 Encounter for antineoplastic radiation therapy: Secondary | ICD-10-CM | POA: Diagnosis present

## 2019-10-01 DIAGNOSIS — C61 Malignant neoplasm of prostate: Secondary | ICD-10-CM | POA: Diagnosis present

## 2019-10-02 ENCOUNTER — Ambulatory Visit
Admission: RE | Admit: 2019-10-02 | Discharge: 2019-10-02 | Disposition: A | Discharge status: Home / Self Care | Payer: Medicare Other | Source: Ambulatory Visit | Attending: Radiation Oncology | Admitting: Radiation Oncology

## 2019-10-02 ENCOUNTER — Other Ambulatory Visit: Payer: Self-pay

## 2019-10-02 DIAGNOSIS — Z510 Encounter for antineoplastic radiation therapy: Secondary | ICD-10-CM | POA: Diagnosis not present

## 2019-10-05 ENCOUNTER — Ambulatory Visit
Admission: RE | Admit: 2019-10-05 | Discharge: 2019-10-05 | Disposition: A | Discharge status: Home / Self Care | Payer: Medicare Other | Source: Ambulatory Visit | Attending: Radiation Oncology | Admitting: Radiation Oncology

## 2019-10-05 ENCOUNTER — Other Ambulatory Visit: Payer: Self-pay

## 2019-10-05 DIAGNOSIS — Z510 Encounter for antineoplastic radiation therapy: Secondary | ICD-10-CM | POA: Diagnosis not present

## 2019-10-06 ENCOUNTER — Ambulatory Visit
Admission: RE | Admit: 2019-10-06 | Discharge: 2019-10-06 | Disposition: A | Discharge status: Home / Self Care | Payer: Medicare Other | Source: Ambulatory Visit | Attending: Radiation Oncology | Admitting: Radiation Oncology

## 2019-10-06 ENCOUNTER — Other Ambulatory Visit: Payer: Self-pay

## 2019-10-06 DIAGNOSIS — Z510 Encounter for antineoplastic radiation therapy: Secondary | ICD-10-CM | POA: Diagnosis not present

## 2019-10-07 ENCOUNTER — Ambulatory Visit
Admission: RE | Admit: 2019-10-07 | Discharge: 2019-10-07 | Disposition: A | Discharge status: Home / Self Care | Payer: Medicare Other | Source: Ambulatory Visit | Attending: Radiation Oncology | Admitting: Radiation Oncology

## 2019-10-07 ENCOUNTER — Telehealth: Payer: Self-pay | Admitting: Radiation Oncology

## 2019-10-07 ENCOUNTER — Other Ambulatory Visit: Payer: Self-pay

## 2019-10-07 DIAGNOSIS — Z510 Encounter for antineoplastic radiation therapy: Secondary | ICD-10-CM | POA: Diagnosis not present

## 2019-10-07 NOTE — Telephone Encounter (Signed)
Phoned patient's daughter, [name]. No answer. Left voicemail message detailing that her father will have a PUT encounter on Friday with Dr. [name] immediately following his radiation. Encouraged her to come with him because the treatment team had expressed concern about his confusion. Requested she phone me back to confirm his baseline. Provided my directed number. Awaiting return call.

## 2019-10-08 ENCOUNTER — Ambulatory Visit
Admission: RE | Admit: 2019-10-08 | Discharge: 2019-10-08 | Disposition: A | Discharge status: Home / Self Care | Payer: Medicare Other | Source: Ambulatory Visit | Attending: Radiation Oncology | Admitting: Radiation Oncology

## 2019-10-08 ENCOUNTER — Other Ambulatory Visit: Payer: Self-pay

## 2019-10-08 DIAGNOSIS — Z510 Encounter for antineoplastic radiation therapy: Secondary | ICD-10-CM | POA: Diagnosis not present

## 2019-10-09 ENCOUNTER — Other Ambulatory Visit: Payer: Self-pay

## 2019-10-09 ENCOUNTER — Ambulatory Visit
Admission: RE | Admit: 2019-10-09 | Discharge: 2019-10-09 | Disposition: A | Discharge status: Home / Self Care | Payer: Medicare Other | Source: Ambulatory Visit | Attending: Radiation Oncology | Admitting: Radiation Oncology

## 2019-10-09 DIAGNOSIS — Z510 Encounter for antineoplastic radiation therapy: Secondary | ICD-10-CM | POA: Diagnosis not present

## 2019-10-12 ENCOUNTER — Other Ambulatory Visit: Payer: Self-pay

## 2019-10-12 ENCOUNTER — Ambulatory Visit
Admission: RE | Admit: 2019-10-12 | Discharge: 2019-10-12 | Disposition: A | Discharge status: Home / Self Care | Payer: Medicare Other | Source: Ambulatory Visit | Attending: Radiation Oncology | Admitting: Radiation Oncology

## 2019-10-12 DIAGNOSIS — Z510 Encounter for antineoplastic radiation therapy: Secondary | ICD-10-CM | POA: Diagnosis not present

## 2019-10-13 ENCOUNTER — Other Ambulatory Visit: Payer: Self-pay

## 2019-10-13 ENCOUNTER — Ambulatory Visit
Admission: RE | Admit: 2019-10-13 | Discharge: 2019-10-13 | Disposition: A | Discharge status: Home / Self Care | Payer: Medicare Other | Source: Ambulatory Visit | Attending: Radiation Oncology | Admitting: Radiation Oncology

## 2019-10-13 DIAGNOSIS — Z510 Encounter for antineoplastic radiation therapy: Secondary | ICD-10-CM | POA: Diagnosis not present

## 2019-10-13 NOTE — Progress Notes (Signed)
While patient was receiving his radiation therapy for the day his daughter, [name], presented to the rad onc nursing clinic requesting to speak with Dr. [name]. Explained that Dr. [name] is in surgery and offered to answer or relay any questions she may have. [name] reports that she is simply looking for an update on her father and questions "if the cancer is shrinking." Explained that by all accounts her father is responding well to radiation therapy. Explained the radiation will continue to work in his system for a few weeks after completion thus a PSA will not be collected until 3 months status post treatment. Explained the PSA at the three month mark is most reflective of how well her father has responded to the radiation therapy. She questions when his foley will come out because he "has had it in for seven months." Explained foley removal will be up to the urologist but for now I would suspect he will have it for the duration of radiation therapy and at least a month thereafter. She reports her father has turned the corner and fighting for his life now. She reports he showers daily, goes to the gym and has abstains from cigarettes and alcohol since August. Provided her my business card and encouraged she call me with needs. Explained Imodium is safe for her father to use to manage diarrhea. Encouraged she phone this RN should her father decide he needs a script for the bladder spasms he is having. [name] confirms all her questions have been answered by this RN and she no longer desire to receive a call from Dr. [name]. She request to be placed on speaker phone for future PUT encounters.

## 2019-10-14 ENCOUNTER — Ambulatory Visit
Admission: RE | Admit: 2019-10-14 | Discharge: 2019-10-14 | Disposition: A | Discharge status: Home / Self Care | Payer: Medicare Other | Source: Ambulatory Visit | Attending: Radiation Oncology | Admitting: Radiation Oncology

## 2019-10-14 ENCOUNTER — Other Ambulatory Visit: Payer: Self-pay

## 2019-10-14 DIAGNOSIS — Z510 Encounter for antineoplastic radiation therapy: Secondary | ICD-10-CM | POA: Diagnosis not present

## 2019-10-15 ENCOUNTER — Other Ambulatory Visit: Payer: Self-pay

## 2019-10-15 ENCOUNTER — Ambulatory Visit
Admission: RE | Admit: 2019-10-15 | Discharge: 2019-10-15 | Disposition: A | Discharge status: Home / Self Care | Payer: Medicare Other | Source: Ambulatory Visit | Attending: Radiation Oncology | Admitting: Radiation Oncology

## 2019-10-15 DIAGNOSIS — Z510 Encounter for antineoplastic radiation therapy: Secondary | ICD-10-CM | POA: Diagnosis not present

## 2019-10-16 ENCOUNTER — Other Ambulatory Visit: Payer: Self-pay

## 2019-10-16 ENCOUNTER — Ambulatory Visit
Admission: RE | Admit: 2019-10-16 | Discharge: 2019-10-16 | Disposition: A | Discharge status: Home / Self Care | Payer: Medicare Other | Source: Ambulatory Visit | Attending: Radiation Oncology | Admitting: Radiation Oncology

## 2019-10-16 DIAGNOSIS — Z510 Encounter for antineoplastic radiation therapy: Secondary | ICD-10-CM | POA: Diagnosis not present

## 2019-10-19 ENCOUNTER — Ambulatory Visit
Admission: RE | Admit: 2019-10-19 | Discharge: 2019-10-19 | Disposition: A | Discharge status: Home / Self Care | Payer: Medicare Other | Source: Ambulatory Visit | Attending: Radiation Oncology | Admitting: Radiation Oncology

## 2019-10-19 ENCOUNTER — Telehealth: Payer: Self-pay | Admitting: Radiation Oncology

## 2019-10-19 ENCOUNTER — Other Ambulatory Visit: Payer: Self-pay

## 2019-10-19 DIAGNOSIS — Z510 Encounter for antineoplastic radiation therapy: Secondary | ICD-10-CM | POA: Diagnosis not present

## 2019-10-19 NOTE — Telephone Encounter (Signed)
Late entry from 10/16/2019 at 1030. Patient reports during PUT encounter today he noticed an increasing amount of blood in his foley collection bag. Patient denies foley trauma. Patient denies feeling light headed or dizzy presently. Vitals are stable. Phoned patient's daughter, [name], with patient present in the room. Requested she monitor her father's foley collection bag for blood. Explained that if she continues to see blood in his foley collection bag she should contact Dr. Lyman Bishop about Eliquis bid. [name] verbalized understanding of all reviewed.

## 2019-10-20 ENCOUNTER — Other Ambulatory Visit: Payer: Self-pay

## 2019-10-20 ENCOUNTER — Ambulatory Visit
Admission: RE | Admit: 2019-10-20 | Discharge: 2019-10-20 | Disposition: A | Discharge status: Home / Self Care | Payer: Medicare Other | Source: Ambulatory Visit | Attending: Radiation Oncology | Admitting: Radiation Oncology

## 2019-10-20 DIAGNOSIS — Z510 Encounter for antineoplastic radiation therapy: Secondary | ICD-10-CM | POA: Diagnosis not present

## 2019-10-21 ENCOUNTER — Ambulatory Visit
Admission: RE | Admit: 2019-10-21 | Discharge: 2019-10-21 | Disposition: A | Discharge status: Home / Self Care | Payer: Medicare Other | Source: Ambulatory Visit | Attending: Radiation Oncology | Admitting: Radiation Oncology

## 2019-10-21 ENCOUNTER — Other Ambulatory Visit: Payer: Self-pay

## 2019-10-21 DIAGNOSIS — Z510 Encounter for antineoplastic radiation therapy: Secondary | ICD-10-CM | POA: Diagnosis not present

## 2019-10-22 ENCOUNTER — Ambulatory Visit
Admission: RE | Admit: 2019-10-22 | Discharge: 2019-10-22 | Disposition: A | Discharge status: Home / Self Care | Payer: Medicare Other | Source: Ambulatory Visit | Attending: Radiation Oncology | Admitting: Radiation Oncology

## 2019-10-22 ENCOUNTER — Other Ambulatory Visit: Payer: Self-pay

## 2019-10-22 DIAGNOSIS — Z510 Encounter for antineoplastic radiation therapy: Secondary | ICD-10-CM | POA: Diagnosis not present

## 2019-10-23 ENCOUNTER — Other Ambulatory Visit: Payer: Self-pay

## 2019-10-23 ENCOUNTER — Encounter: Payer: Self-pay | Admitting: Radiation Oncology

## 2019-10-23 ENCOUNTER — Encounter: Payer: Self-pay | Admitting: Urology

## 2019-10-23 ENCOUNTER — Encounter: Payer: Self-pay | Admitting: Medical Oncology

## 2019-10-23 ENCOUNTER — Ambulatory Visit
Admission: RE | Admit: 2019-10-23 | Discharge: 2019-10-23 | Disposition: A | Discharge status: Home / Self Care | Payer: Medicare Other | Source: Ambulatory Visit | Attending: Radiation Oncology | Admitting: Radiation Oncology

## 2019-10-23 DIAGNOSIS — Z510 Encounter for antineoplastic radiation therapy: Secondary | ICD-10-CM | POA: Diagnosis not present

## 2019-10-23 NOTE — Progress Notes (Signed)
  Radiation Oncology         [phone] ________________________________  Name: Nathan [name] MRN: [id number]  Date: 10/23/2019  DOB: [birth year]  Chart Note:  This patient completes radiation therapy today for his prostate cancer.  He has been experiencing gross hematuria for the past 2 weeks.  He describes it as red urine which turns the toilet bowl water red, rather than just a pink tinge.  I am optimistic that the hematuria is related to acute radiation effects to the prostate and bladder, which will resolve over the next 1-2 weeks.  However, other etiologies may be contributing to this.  I advised the patient to contact me if his symptoms persist or worsen.  ________________________________  Sheral Apley [name], M.D.

## 2019-11-25 ENCOUNTER — Telehealth: Payer: Self-pay

## 2019-11-25 NOTE — Telephone Encounter (Signed)
Appointment reminder for 5/27/21n unable to get patient on the phone to confirm encounter

## 2019-11-26 ENCOUNTER — Encounter: Payer: Self-pay | Admitting: Urology

## 2019-11-26 ENCOUNTER — Telehealth: Payer: Self-pay

## 2019-11-26 ENCOUNTER — Other Ambulatory Visit: Payer: Self-pay

## 2019-11-26 ENCOUNTER — Ambulatory Visit
Admission: RE | Admit: 2019-11-26 | Discharge: 2019-11-26 | Disposition: A | Discharge status: Home / Self Care | Payer: Medicare Other | Source: Ambulatory Visit | Attending: Urology | Admitting: Urology

## 2019-11-26 DIAGNOSIS — C61 Malignant neoplasm of prostate: Secondary | ICD-10-CM

## 2019-11-26 NOTE — Telephone Encounter (Signed)
Unable to get an answer at the home phone number provided. Spoke with daughter yesterday she stated she would have them to answer the phone. Called multiple times without an answer

## 2019-11-26 NOTE — Telephone Encounter (Signed)
Spoke with daughter she will participate in telephone encounter

## 2019-11-26 NOTE — Progress Notes (Signed)
Follow up appointment for prostate patient. Reports some blood in his urine not a whole lot. Denies any issues with bowels. Patient has a foley catheter which is patient and urine is flowing.

## 2019-11-26 NOTE — Progress Notes (Signed)
Radiation Oncology         [phone] ________________________________  Name: Arvid Degroot MRN: [id number]  Date: 11/26/2019  DOB: [birth year]  Post Treatment Note  CC: [name], MD  Raynelle Bring, MD  Diagnosis:   74 y.o. 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  Interval Since Last Radiation:  5 weeks  09/16/19 - 10/23/19:  The prostate was treated to 70 Gy in 28 fractions of 2.5 Gy each.   Narrative:  I spoke with the patient to conduct his routine scheduled 1 month follow up visit via telephone to spare the patient unnecessary potential exposure in the healthcare setting during the current COVID-19 pandemic.  The patient was notified in advance and gave permission to proceed with this visit format.  He continued with indwelling foley catheter throughout his course of treatment with occasional gross hematuria and bladder spasms but tolerated treatment well in general.                              On review of systems, the patient states that he is doing well overall.  His Foley catheter was recently changed at [facility] on 11/10/2019.  A urine culture was performed at that time and did reveal bacteria so he completed a course of doxycycline.  He continues with intermittent gross hematuria but reports that the bladder spasms are much improved and the blood in the urine is only intermittent at this point.  He denies any recent fevers, chills, night sweats or flank pain.  He reports normal bowel movements and denies abdominal pain or suprapubic discomfort.  He reports a healthy appetite and is maintaining his weight.  Overall, he is pleased with his progress to date.  ALLERGIES:  has No Known Allergies.  Meds: Current Outpatient Medications  Medication Sig Dispense Refill  . apixaban (ELIQUIS) 5 MG TABS tablet Take 1 tablet (5 mg total) by mouth 2 (two) times daily. 60 tablet 11  . folic acid (FOLVITE) 1 MG tablet  Take 1 tablet by mouth daily.    Marland Kitchen ketorolac (ACULAR) 0.4 % SOLN Place 1 drop into the right eye 4 (four) times daily.    Marland Kitchen lisinopril (ZESTRIL) 20 MG tablet Take 20 mg by mouth daily.    . magnesium oxide (MAG-OX) 400 MG tablet Take 1 tablet by mouth daily.    . metoprolol succinate (TOPROL XL) 25 MG 24 hr tablet Take 1 tablet (25 mg total) by mouth daily. 90 tablet 3  . Multiple Vitamins-Minerals (MULTIVITAMIN ADULT PO) Take by mouth.    Marland Kitchen ofloxacin (OCUFLOX) 0.3 % ophthalmic solution Place 1 drop into the right eye 4 (four) times daily.    . prednisoLONE acetate (PRED FORTE) 1 % ophthalmic suspension Place 1 drop into the right eye 4 (four) times daily.    . vitamin B-12 (CYANOCOBALAMIN) 1000 MCG tablet Take 1,000 mcg by mouth daily.    Marland Kitchen VITAMIN D, CHOLECALCIFEROL, PO Take by mouth.     No current facility-administered medications for this encounter.    Physical Findings:  vitals were not taken for this visit.   /Unable to assess due to telephone follow-up visit format.  Lab Findings: No results found for: WBC, HGB, HCT, MCV, PLT   Radiographic Findings: No results found.  Impression/Plan: 1. 74 y.o. 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 He will continue to follow up with urology for ongoing PSA determinations and has an appointment scheduled with Dr. Alinda Money on 12/18/2019 for repeat labs and will see Dr. Alinda Money thereafter. He understands what to expect with regards to PSA monitoring going forward. I will look forward to following his response to treatment via correspondence with urology, and would be happy to continue to participate in his care if clinically indicated. I talked to the patient about what to expect in the future, including his risk for erectile dysfunction and rectal bleeding. I encouraged him to call or return to the office if he has any questions regarding his previous radiation or possible radiation side  effects. He was comfortable with this plan and will follow up as needed.   Today, a comprehensive survivorship care plan and treatment summary was reviewed with the patient today detailing his prostate cancer diagnosis, treatment course, potential late/long-term effects of treatment, appropriate follow-up care with recommendations for the future, and patient education resources.  A copy of this summary, along with a letter will be sent to the patient's primary care provider via fax after today's visit.  2. Cancer screening:  Due to Mr. Caranci history and his age, he should receive screening for skin cancers, colon cancer, and lung cancer.  The information and recommendations are listed on the patient's comprehensive care plan/treatment summary and were reviewed in detail with the patient.     3. Health maintenance and wellness promotion: Mr. Dubs was encouraged to consume 5-7 servings of fruits and vegetables per day. He was provided a copy of the "Nutrition Rainbow" handout, as well as the handout "Take Control of Your Health and [place]" from the [place].  He was also encouraged to engage in moderate to vigorous exercise for 30 minutes per day most days of the week. Information was provided regarding the [facility] fitness program, which is designed for cancer survivors to help them become more physically fit after cancer treatments. We discussed that a healthy BMI is 18.5-24.9 and that maintaining a healthy weight reduces risk of cancer recurrences.  He was instructed to limit his alcohol consumption and continue to abstain from tobacco use.  Lastly, he was encouraged to use sunscreen and wear protective clothing when in the sun.     4. Support services/counseling: It is not uncommon for this period of the patient's cancer care trajectory to be one of many emotions and stressors.  Mr. Alamillo was encouraged to take advantage of our many support services programs,  support groups, and/or counseling in coping with his new life as a cancer survivor after completing anti-cancer treatment.  He was offered support today through active listening and expressive supportive counseling.  He was given information regarding our available services and encouraged to contact me with any questions or for help enrolling in any of our support group/programs.       Nicholos Johns, PA-C

## 2020-01-18 ENCOUNTER — Other Ambulatory Visit: Payer: Self-pay

## 2020-01-18 ENCOUNTER — Encounter: Payer: Self-pay | Admitting: Internal Medicine

## 2020-01-18 ENCOUNTER — Ambulatory Visit (INDEPENDENT_AMBULATORY_CARE_PROVIDER_SITE_OTHER): Payer: Medicare Other | Admitting: Internal Medicine

## 2020-01-18 VITALS — BP 140/78 | HR 70 | Temp 97.0°F | Ht 73.0 in | Wt 177.2 lb

## 2020-01-18 DIAGNOSIS — Z01810 Encounter for preprocedural cardiovascular examination: Secondary | ICD-10-CM

## 2020-01-18 DIAGNOSIS — Z7901 Long term (current) use of anticoagulants: Secondary | ICD-10-CM | POA: Diagnosis not present

## 2020-01-18 DIAGNOSIS — I456 Pre-excitation syndrome: Secondary | ICD-10-CM

## 2020-01-18 DIAGNOSIS — I10 Essential (primary) hypertension: Secondary | ICD-10-CM

## 2020-01-18 DIAGNOSIS — I4891 Unspecified atrial fibrillation: Secondary | ICD-10-CM

## 2020-01-18 NOTE — Progress Notes (Signed)
OFFICE NOTE  Chief Complaint:  Follow-up A. Fib  Primary Care Physician: [name], MD  HPI:  Nathan [name] is a 74 y.o. 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  He was not followed by cardiologist rather was seen by internal medicine in [place].  He recently moved to the area to be with his daughter.  He also has a history of alcohol abuse in the past which sounded fairly heavy.  Recently was diagnosed with prostate cancer and is considering treatment options.  There is also diabetes and hypertension.  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  His medical providers name was Dr. Sandie Ano at [facility] in [place] - phone # [phone].  He had been maintained on warfarin and reported compliance with the medication however his dose was quite low at only 1 mg daily.  His daughter reports that since she has been taking care of him here in [place] which is for several months he has been alcohol free.  Overall Nathan [name] is without complaints.  He denies any chest pain or worsening shortness of breath.  Recently he was having issues with hypotension and his primary care provider discontinued a number of medications including his beta-blocker.  He remains on lisinopril and nifedipine.  He was previously on Toprol-XL 25 mg daily.  07/10/2019  [name] returns today for follow-up of his A. fib.  Overall he is doing well now on Eliquis.  He says he does not feel any different on it.  He had no bleeding issues with it.  I switched him to Toprol-XL which he previously taken to help with any possible breakthrough of WPW or A. fib.  I have also taken off of the Procardia.  Blood pressure is well controlled today 133/73.  He is working with both urology and oncology as far as treatment for diagnosed prostate cancer.  He is likely  getting get radiation seed implants.  He is also going to be deferring his lipoma resection.  If necessary at this point he could stop his Eliquis 2 to 3 days prior to that and restarted afterwards when bleeding risk is low.  01/18/2020  [name] is seen today in follow-up.  He successfully underwent radiation seed implants and is noted per his daughter to have marked decline in his PSA from over 12 down to 3.  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  She says that recently he has been having notable hematuria.  This seems to be somewhat intermittent however said that the urologist wanted to talk with me about managing his Eliquis.  In addition he will likely have to stop the Eliquis for short period of time for the chest wall lipoma.  G shows that he is in sinus rhythm today.  PMHx:  Past Medical History:  Diagnosis Date   Atrial fibrillation ([place])    Cancer of prostate ([place])    Chronic anticoagulation    Diabetes ([place])    HTN (hypertension)    Lipoma of chest wall    WPW (Wolff-Parkinson-White syndrome)    No prior ablation    Past Surgical History:  Procedure Laterality Date   CATARACT EXTRACTION      FAMHx:  Family History  Problem Relation Age of Onset   Lung cancer Mother  Cervical cancer Sister    Breast cancer Neg Hx    Pancreatic cancer Neg Hx    Colon cancer Neg Hx     SOCHx:   reports that he quit smoking about a year ago. His smoking use included cigarettes. He has a 30.00 pack-year smoking history. He has quit using smokeless tobacco. He reports previous alcohol use. He reports that he does not use drugs.  ALLERGIES:  No Known Allergies  ROS: Pertinent items noted in HPI and remainder of comprehensive ROS otherwise negative.  HOME MEDS: Current Outpatient Medications on File Prior to Visit  Medication Sig Dispense Refill   apixaban (ELIQUIS) 5 MG TABS tablet Take 1 tablet (5 mg total) by mouth 2 (two) times daily. 60 tablet 11     folic acid (FOLVITE) 1 MG tablet Take 1 tablet by mouth daily.     lisinopril (ZESTRIL) 20 MG tablet Take 20 mg by mouth daily.     magnesium oxide (MAG-OX) 400 MG tablet Take 1 tablet by mouth daily.     metoprolol succinate (TOPROL XL) 25 MG 24 hr tablet Take 1 tablet (25 mg total) by mouth daily. 90 tablet 3   Multiple Vitamins-Minerals (MULTIVITAMIN ADULT PO) Take by mouth.     ofloxacin (OCUFLOX) 0.3 % ophthalmic solution Place 1 drop into the right eye 4 (four) times daily.     prednisoLONE acetate (PRED FORTE) 1 % ophthalmic suspension Place 1 drop into the right eye 4 (four) times daily.     Prenatal Vit-Fe Fumarate-FA (PRENATAL MULTIVITAMIN) TABS tablet Take 1 tablet by mouth daily at 12 noon.     vitamin B-12 (CYANOCOBALAMIN) 1000 MCG tablet Take 1,000 mcg by mouth daily.     VITAMIN D, CHOLECALCIFEROL, PO Take by mouth.     No current facility-administered medications on file prior to visit.    LABS/IMAGING: No results found for this or any previous visit (from the past 48 hour(s)). No results found.  LIPID PANEL: No results found for: CHOL, TRIG, HDL, CHOLHDL, VLDL, LDLCALC, LDLDIRECT   WEIGHTS: Wt Readings from Last 3 Encounters:  01/18/20 177 lb 3.2 oz (80.4 kg)  07/10/19 169 lb (76.7 kg)  07/07/19 164 lb (74.4 kg)    VITALS: BP 140/78    Pulse 70    Temp (!) 97 F (36.1 C)    Ht 6\' 1"  (1.854 m)    Wt 177 lb 3.2 oz (80.4 kg)    SpO2 99%    BMI 23.38 kg/m   EXAM: General appearance: alert, no distress and Thin Neck: no carotid bruit, no JVD and thyroid not enlarged, symmetric, no tenderness/mass/nodules Lungs: clear to auscultation bilaterally Heart: Regular tachycardia, no murmur Abdomen: soft, non-tender; bowel sounds normal; no masses,  no organomegaly Extremities: extremities normal, atraumatic, no cyanosis or edema Pulses: 2+ and symmetric Skin: Skin color, texture, turgor normal. No rashes or lesions Neurologic: Grossly normal Psych:  Pleasant  EKG: Normal sinus rhythm at 70-personally reviewed  ASSESSMENT: 1. Reported history of A. fib with WPW 2. Anticoagulated on Eliquis 3. History of chronic alcohol abuse-now abstinent 4. Chest wall lipoma 5. Prostate cancer-with recent hematuria 6. Hypertension 7. Type 2 diabetes-diet controlled, A1c 6.0  PLAN: 1.   Mr. Raczka has had recent radiation seed therapy for prostate cancer and has had some hematuria.  This could be an ongoing issue and although he is not in A. fib now does carry increased risk for stroke.  We could consider possibly holding his Eliquis for  a week or so to see if the bleeding improves.  This might make sense to do around the time of surgery for his chest wall lipoma.  I will try to coordinate this both with the urologist and the general surgeon, but ultimately I would like to reestablish him back on the Eliquis for risk reduction.  Pixie Casino, MD, [facility], [place] Director of the Advanced Lipid Disorders &  Cardiovascular Risk Reduction Clinic Diplomate of the American Board of Clinical Lipidology Attending Cardiologist  Direct Dial: [phone]   Fax: [phone]  Website:  [email or web address] 01/18/2020, 11:14 AM

## 2020-01-18 NOTE — Patient Instructions (Signed)
Medication Instructions:  Your physician recommends that you continue on your current medications as directed. Please refer to the Current Medication list given to you today.  *If you need a refill on your cardiac medications before your next appointment, please call your pharmacy*   Follow-Up: At [facility], you and your health needs are our priority.  As part of our continuing mission to provide you with exceptional heart care, we have created designated Provider Care Teams.  These Care Teams include your primary Cardiologist (physician) and Advanced Practice Providers (APPs -  Physician Assistants and Nurse Practitioners) who all work together to provide you with the care you need, when you need it.  We recommend signing up for the patient portal called "MyChart".  Sign up information is provided on this After Visit Summary.  MyChart is used to connect with patients for Virtual Visits (Telemedicine).  Patients are able to view lab/test results, encounter notes, upcoming appointments, etc.  Non-urgent messages can be sent to your provider as well.   To learn more about what you can do with MyChart, go to [email or web address].    Your next appointment:   6 month(s)  The format for your next appointment:   In Person  Provider:   You may see Dr. Hilty or one of the following Advanced Practice Providers on your designated Care Team:    Hao Meng, PA-C  Angela Duke, PA-C or   Krista Kroeger, PA-C    Other Instructions   

## 2020-01-20 ENCOUNTER — Telehealth: Payer: Self-pay

## 2020-01-20 NOTE — Telephone Encounter (Signed)
Patient with diagnosis of atrial fibrillation on Eliquis for anticoagulation.    Procedure: chest lipoma removal Date of procedure: TBD  CHADS2-VASc score of  4 (HTN, AGE x 2, DM2)  CrCl 70.8 Platelet count 365  Per office protocol, patient can hold Eliquis for 2 days prior to procedure.    Patient will not need bridging with Lovenox (enoxaparin) around procedure.

## 2020-01-20 NOTE — Telephone Encounter (Signed)
   [facility] Pre-operative Risk Assessment    Request for surgical clearance:  1. What type of surgery is being performed? CHEST LIPOMA REMOVAL   2. When is this surgery scheduled? TBD   3. What type of clearance is required (medical clearance vs. Pharmacy clearance to hold med vs. Both)? BOTH  4. Are there any medications that need to be held prior to surgery and how long?WPYKDXIP-3 DAYS PRIOR LOVENOX BRIDGE??  5. Practice name and name of physician performing surgery? [facility] Assunta Curtis  ATTN:APRIL STATON, CMA  6. What is the office phone number? [phone]   7.   What is the office fax number? [phone]  8.   Anesthesia type (None, local, MAC, general) ? GENERAL

## 2020-01-21 NOTE — Telephone Encounter (Signed)
   Primary Cardiologist: Pixie Casino, MD  Chart reviewed as part of pre-operative protocol coverage. Given past medical history and time since last visit, based on ACC/AHA guidelines, Nathan [name] would be at acceptable risk for the planned procedure without further cardiovascular testing.   OK to hold Eliquis 2 days pre op.  Resume when safe post op.  I will route this recommendation to the requesting party via Epic fax function and remove from pre-op pool.  Please call with questions.  Kerin Ransom, PA-C 01/21/2020, 8:42 AM

## 2020-01-31 DIAGNOSIS — J189 Pneumonia, unspecified organism: Secondary | ICD-10-CM

## 2020-01-31 DIAGNOSIS — I4891 Unspecified atrial fibrillation: Secondary | ICD-10-CM

## 2020-01-31 HISTORY — DX: Pneumonia, unspecified organism: J18.9

## 2020-01-31 HISTORY — DX: Unspecified atrial fibrillation: I48.91

## 2020-02-03 ENCOUNTER — Ambulatory Visit: Payer: Self-pay | Admitting: General Surgery

## 2020-02-05 NOTE — Progress Notes (Signed)
  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     Indication for treatment:  Curative, Definitive Radiotherapy       Radiation treatment dates:   09/16/19 - 10/23/19  Site/dose:   The prostate was treated to 70 Gy in 28 fractions of 2.5 Gy  Beams/energy:   The patient was treated with IMRT using volumetric arc therapy delivering 6 MV X-rays to clockwise and counterclockwise circumferential arcs with a 90 degree collimator offset to avoid dose scalloping.  Image guidance was performed with daily cone beam CT prior to each fraction to align to gold markers in the prostate and assure proper bladder and rectal fill volumes.  Immobilization was achieved with BodyFix custom mold.  Narrative: The patient tolerated radiation treatment relatively well.   He continued with indwelling foley catheter throughout his course of treatment with occasional gross hematuria and bladder spasms but tolerated treatment well in general despite some modest fatigue.  Plan: The patient has completed radiation treatment. He will return to radiation oncology clinic for routine followup in one month. I advised him to call or return sooner if he has any questions or concerns related to his recovery or treatment. ________________________________  Sheral Apley. [name], M.D.

## 2020-02-22 ENCOUNTER — Encounter (HOSPITAL_COMMUNITY): Payer: Self-pay | Admitting: *Deleted

## 2020-02-22 ENCOUNTER — Inpatient Hospital Stay (HOSPITAL_COMMUNITY)
Admission: EM | Admit: 2020-02-22 | Discharge: 2020-02-28 | DRG: 698 | Disposition: A | Discharge status: Home / Self Care | Payer: Medicare Other | Attending: Internal Medicine | Admitting: Internal Medicine

## 2020-02-22 ENCOUNTER — Emergency Department (HOSPITAL_COMMUNITY): Payer: Medicare Other

## 2020-02-22 DIAGNOSIS — E119 Type 2 diabetes mellitus without complications: Secondary | ICD-10-CM | POA: Diagnosis present

## 2020-02-22 DIAGNOSIS — Z79899 Other long term (current) drug therapy: Secondary | ICD-10-CM | POA: Diagnosis not present

## 2020-02-22 DIAGNOSIS — J189 Pneumonia, unspecified organism: Secondary | ICD-10-CM | POA: Diagnosis not present

## 2020-02-22 DIAGNOSIS — I10 Essential (primary) hypertension: Secondary | ICD-10-CM | POA: Diagnosis present

## 2020-02-22 DIAGNOSIS — Z87891 Personal history of nicotine dependence: Secondary | ICD-10-CM

## 2020-02-22 DIAGNOSIS — T83511A Infection and inflammatory reaction due to indwelling urethral catheter, initial encounter: Secondary | ICD-10-CM | POA: Diagnosis not present

## 2020-02-22 DIAGNOSIS — Z8546 Personal history of malignant neoplasm of prostate: Secondary | ICD-10-CM | POA: Diagnosis not present

## 2020-02-22 DIAGNOSIS — E872 Acidosis: Secondary | ICD-10-CM | POA: Diagnosis not present

## 2020-02-22 DIAGNOSIS — I480 Paroxysmal atrial fibrillation: Secondary | ICD-10-CM | POA: Diagnosis present

## 2020-02-22 DIAGNOSIS — Z7901 Long term (current) use of anticoagulants: Secondary | ICD-10-CM

## 2020-02-22 DIAGNOSIS — A419 Sepsis, unspecified organism: Secondary | ICD-10-CM | POA: Diagnosis present

## 2020-02-22 DIAGNOSIS — N179 Acute kidney failure, unspecified: Secondary | ICD-10-CM | POA: Diagnosis present

## 2020-02-22 DIAGNOSIS — Z20822 Contact with and (suspected) exposure to covid-19: Secondary | ICD-10-CM | POA: Diagnosis present

## 2020-02-22 DIAGNOSIS — R069 Unspecified abnormalities of breathing: Secondary | ICD-10-CM | POA: Diagnosis not present

## 2020-02-22 DIAGNOSIS — J690 Pneumonitis due to inhalation of food and vomit: Secondary | ICD-10-CM

## 2020-02-22 DIAGNOSIS — N390 Urinary tract infection, site not specified: Secondary | ICD-10-CM | POA: Diagnosis not present

## 2020-02-22 DIAGNOSIS — R6521 Severe sepsis with septic shock: Secondary | ICD-10-CM | POA: Diagnosis present

## 2020-02-22 DIAGNOSIS — J9601 Acute respiratory failure with hypoxia: Secondary | ICD-10-CM | POA: Diagnosis not present

## 2020-02-22 DIAGNOSIS — N136 Pyonephrosis: Secondary | ICD-10-CM | POA: Diagnosis present

## 2020-02-22 DIAGNOSIS — I482 Chronic atrial fibrillation, unspecified: Secondary | ICD-10-CM | POA: Diagnosis present

## 2020-02-22 DIAGNOSIS — G9341 Metabolic encephalopathy: Secondary | ICD-10-CM

## 2020-02-22 DIAGNOSIS — E876 Hypokalemia: Secondary | ICD-10-CM | POA: Diagnosis present

## 2020-02-22 DIAGNOSIS — K5730 Diverticulosis of large intestine without perforation or abscess without bleeding: Secondary | ICD-10-CM | POA: Diagnosis present

## 2020-02-22 DIAGNOSIS — A4159 Other Gram-negative sepsis: Secondary | ICD-10-CM | POA: Diagnosis present

## 2020-02-22 DIAGNOSIS — Z801 Family history of malignant neoplasm of trachea, bronchus and lung: Secondary | ICD-10-CM | POA: Diagnosis not present

## 2020-02-22 HISTORY — DX: Presence of other specified devices: Z97.8

## 2020-02-22 LAB — PHOSPHORUS: Phosphorus: 2.2 mg/dL — ABNORMAL LOW (ref 2.5–4.6)

## 2020-02-22 LAB — COMPREHENSIVE METABOLIC PANEL
ALT: 14 U/L (ref 0–44)
AST: 44 U/L — ABNORMAL HIGH (ref 15–41)
Albumin: 2.8 g/dL — ABNORMAL LOW (ref 3.5–5.0)
Alkaline Phosphatase: 75 U/L (ref 38–126)
Anion gap: 23 — ABNORMAL HIGH (ref 5–15)
BUN: 61 mg/dL — ABNORMAL HIGH (ref 8–23)
CO2: 16 mmol/L — ABNORMAL LOW (ref 22–32)
Calcium: 9.1 mg/dL (ref 8.9–10.3)
Chloride: 102 mmol/L (ref 98–111)
Creatinine, Ser: 5.67 mg/dL — ABNORMAL HIGH (ref 0.61–1.24)
GFR calc Af Amer: 10 mL/min — ABNORMAL LOW (ref >60)
GFR calc non Af Amer: 9 mL/min — ABNORMAL LOW (ref >60)
Glucose, Bld: 127 mg/dL — ABNORMAL HIGH (ref 70–99)
Potassium: 3.9 mmol/L (ref 3.5–5.1)
Sodium: 141 mmol/L (ref 135–145)
Total Bilirubin: 0.9 mg/dL (ref 0.3–1.2)
Total Protein: 6.2 g/dL — ABNORMAL LOW (ref 6.5–8.1)

## 2020-02-22 LAB — CBC WITH DIFFERENTIAL/PLATELET
Abs Immature Granulocytes: 0.04 10*3/uL (ref 0.00–0.07)
Basophils Absolute: 0 10*3/uL (ref 0.0–0.1)
Basophils Relative: 0 %
Eosinophils Absolute: 0 10*3/uL (ref 0.0–0.5)
Eosinophils Relative: 0 %
HCT: 39.4 % (ref 39.0–52.0)
Hemoglobin: 11.9 g/dL — ABNORMAL LOW (ref 13.0–17.0)
Immature Granulocytes: 0 %
Lymphocytes Relative: 4 %
Lymphs Abs: 0.4 10*3/uL — ABNORMAL LOW (ref 0.7–4.0)
MCH: 27.2 pg (ref 26.0–34.0)
MCHC: 30.2 g/dL (ref 30.0–36.0)
MCV: 90 fL (ref 80.0–100.0)
Monocytes Absolute: 0.4 10*3/uL (ref 0.1–1.0)
Monocytes Relative: 5 %
Neutro Abs: 8.5 10*3/uL — ABNORMAL HIGH (ref 1.7–7.7)
Neutrophils Relative %: 91 %
Platelets: 168 10*3/uL (ref 150–400)
RBC: 4.38 MIL/uL (ref 4.22–5.81)
RDW: 13.5 % (ref 11.5–15.5)
WBC: 9.3 10*3/uL (ref 4.0–10.5)
nRBC: 0 % (ref 0.0–0.2)

## 2020-02-22 LAB — CBC
HCT: 29.7 % — ABNORMAL LOW (ref 39.0–52.0)
Hemoglobin: 9.2 g/dL — ABNORMAL LOW (ref 13.0–17.0)
MCH: 27.5 pg (ref 26.0–34.0)
MCHC: 31 g/dL (ref 30.0–36.0)
MCV: 88.7 fL (ref 80.0–100.0)
Platelets: UNDETERMINED 10*3/uL (ref 150–400)
RBC: 3.35 MIL/uL — ABNORMAL LOW (ref 4.22–5.81)
RDW: 13.7 % (ref 11.5–15.5)
WBC: 12.5 10*3/uL — ABNORMAL HIGH (ref 4.0–10.5)
nRBC: 0 % (ref 0.0–0.2)

## 2020-02-22 LAB — PROTIME-INR
INR: 1.8 — ABNORMAL HIGH (ref 0.8–1.2)
Prothrombin Time: 20.1 seconds — ABNORMAL HIGH (ref 11.4–15.2)

## 2020-02-22 LAB — URINALYSIS, ROUTINE W REFLEX MICROSCOPIC
Bilirubin Urine: NEGATIVE
Glucose, UA: NEGATIVE mg/dL
Ketones, ur: NEGATIVE mg/dL
Nitrite: NEGATIVE
Protein, ur: 100 mg/dL — AB
RBC / HPF: >50 RBC/hpf — ABNORMAL HIGH (ref 0–5)
Specific Gravity, Urine: 1.009 (ref 1.005–1.030)
pH: 8 (ref 5.0–8.0)

## 2020-02-22 LAB — CK: Total CK: 336 U/L (ref 49–397)

## 2020-02-22 LAB — SARS CORONAVIRUS 2 BY RT PCR (HOSPITAL ORDER, PERFORMED IN ~~LOC~~ HOSPITAL LAB): SARS Coronavirus 2: NEGATIVE

## 2020-02-22 LAB — CREATININE, SERUM
Creatinine, Ser: 4.17 mg/dL — ABNORMAL HIGH (ref 0.61–1.24)
GFR calc Af Amer: 15 mL/min — ABNORMAL LOW (ref >60)
GFR calc non Af Amer: 13 mL/min — ABNORMAL LOW (ref >60)

## 2020-02-22 LAB — POC OCCULT BLOOD, ED: Fecal Occult Bld: NEGATIVE

## 2020-02-22 LAB — APTT: aPTT: 38 seconds — ABNORMAL HIGH (ref 24–36)

## 2020-02-22 LAB — TYPE AND SCREEN
ABO/RH(D): B POS
Antibody Screen: NEGATIVE

## 2020-02-22 LAB — ABO/RH: ABO/RH(D): B POS

## 2020-02-22 LAB — LACTIC ACID, PLASMA
Lactic Acid, Venous: >11 mmol/L (ref 0.5–1.9)
Lactic Acid, Venous: 9.9 mmol/L (ref 0.5–1.9)

## 2020-02-22 LAB — MAGNESIUM: Magnesium: 1.2 mg/dL — ABNORMAL LOW (ref 1.7–2.4)

## 2020-02-22 MED ORDER — LACTATED RINGERS IV BOLUS (SEPSIS)
1000.0000 mL | Freq: Once | INTRAVENOUS | Status: AC
  Administered 2020-02-22: 1000 mL via INTRAVENOUS

## 2020-02-22 MED ORDER — SODIUM CHLORIDE 0.9 % IV SOLN
2.0000 g | INTRAVENOUS | Status: DC
  Administered 2020-02-23: 2 g via INTRAVENOUS
  Filled 2020-02-22: qty 2

## 2020-02-22 MED ORDER — LACTATED RINGERS IV BOLUS
1000.0000 mL | Freq: Once | INTRAVENOUS | Status: AC
  Administered 2020-02-22: 1000 mL via INTRAVENOUS

## 2020-02-22 MED ORDER — LACTATED RINGERS IV SOLN: INTRAVENOUS | Status: AC

## 2020-02-22 MED ORDER — VANCOMYCIN HCL 1500 MG/300ML IV SOLN
1500.0000 mg | Freq: Once | INTRAVENOUS | Status: AC
  Administered 2020-02-22: 1500 mg via INTRAVENOUS
  Filled 2020-02-22: qty 300

## 2020-02-22 MED ORDER — ACETAMINOPHEN 650 MG RE SUPP
975.0000 mg | Freq: Once | RECTAL | Status: AC
  Administered 2020-02-22: 975 mg via RECTAL
  Filled 2020-02-22: qty 1

## 2020-02-22 MED ORDER — INSULIN ASPART 100 UNIT/ML ~~LOC~~ SOLN: 0.0000 [IU] | Freq: Every day | SUBCUTANEOUS | Status: DC

## 2020-02-22 MED ORDER — SODIUM CHLORIDE 0.9 % IV BOLUS: 1000.0000 mL | Freq: Once | INTRAVENOUS | Status: DC

## 2020-02-22 MED ORDER — VANCOMYCIN VARIABLE DOSE PER UNSTABLE RENAL FUNCTION (PHARMACIST DOSING): Status: DC

## 2020-02-22 MED ORDER — NOREPINEPHRINE 4 MG/250ML-% IV SOLN
0.0000 ug/min | INTRAVENOUS | Status: DC
  Administered 2020-02-22: 6 ug/min via INTRAVENOUS
  Administered 2020-02-23: 10 ug/min via INTRAVENOUS
  Filled 2020-02-22 (×3): qty 250

## 2020-02-22 MED ORDER — DOCUSATE SODIUM 100 MG PO CAPS
100.0000 mg | ORAL_CAPSULE | Freq: Two times a day (BID) | ORAL | Status: DC
  Administered 2020-02-24 – 2020-02-27 (×7): 100 mg via ORAL
  Filled 2020-02-22 (×8): qty 1

## 2020-02-22 MED ORDER — SODIUM CHLORIDE 0.9 % IV SOLN
2.0000 g | Freq: Once | INTRAVENOUS | Status: AC
  Administered 2020-02-22: 2 g via INTRAVENOUS
  Filled 2020-02-22: qty 2

## 2020-02-22 MED ORDER — VANCOMYCIN HCL IN DEXTROSE 1-5 GM/200ML-% IV SOLN: 1000.0000 mg | Freq: Once | INTRAVENOUS | Status: DC

## 2020-02-22 MED ORDER — HEPARIN SODIUM (PORCINE) 5000 UNIT/ML IJ SOLN
5000.0000 [IU] | Freq: Three times a day (TID) | INTRAMUSCULAR | Status: DC
  Administered 2020-02-23 – 2020-02-24 (×5): 5000 [IU] via SUBCUTANEOUS
  Filled 2020-02-22 (×5): qty 1

## 2020-02-22 MED ORDER — SODIUM CHLORIDE 0.9 % IV SOLN: 2.0000 g | Freq: Once | INTRAVENOUS | Status: DC

## 2020-02-22 MED ORDER — DEXTROSE IN LACTATED RINGERS 5 % IV SOLN: INTRAVENOUS | Status: DC

## 2020-02-22 MED ORDER — SODIUM CHLORIDE 0.9 % IV SOLN
250.0000 mL | INTRAVENOUS | Status: DC | PRN
  Administered 2020-02-27: 250 mL via INTRAVENOUS

## 2020-02-22 MED ORDER — INSULIN ASPART 100 UNIT/ML ~~LOC~~ SOLN
0.0000 [IU] | Freq: Three times a day (TID) | SUBCUTANEOUS | Status: DC
  Administered 2020-02-23 – 2020-02-26 (×3): 2 [IU] via SUBCUTANEOUS

## 2020-02-22 MED ORDER — LACTATED RINGERS IV BOLUS (SEPSIS)
500.0000 mL | Freq: Once | INTRAVENOUS | Status: AC
  Administered 2020-02-22: 500 mL via INTRAVENOUS

## 2020-02-22 MED ORDER — METRONIDAZOLE IN NACL 5-0.79 MG/ML-% IV SOLN
500.0000 mg | Freq: Once | INTRAVENOUS | Status: AC
  Administered 2020-02-22: 500 mg via INTRAVENOUS
  Filled 2020-02-22: qty 100

## 2020-02-22 MED ORDER — SODIUM CHLORIDE 0.9% FLUSH: 3.0000 mL | INTRAVENOUS | Status: DC | PRN

## 2020-02-22 MED ORDER — POLYETHYLENE GLYCOL 3350 17 G PO PACK
17.0000 g | PACK | Freq: Every day | ORAL | Status: DC
  Administered 2020-02-25 – 2020-02-27 (×3): 17 g via ORAL
  Filled 2020-02-22 (×5): qty 1

## 2020-02-22 MED ORDER — SODIUM CHLORIDE 0.9% FLUSH
3.0000 mL | Freq: Two times a day (BID) | INTRAVENOUS | Status: DC
  Administered 2020-02-24 – 2020-02-27 (×5): 3 mL via INTRAVENOUS

## 2020-02-22 NOTE — Progress Notes (Signed)
Pharmacy Antibiotic Note  Nathan [name] is a 74 y.o. male admitted on 02/22/2020 with sepsis.  Pharmacy has been consulted for vancomycin and cefepime dosing.  Plan: Vancomycin 1500 mg IV x 1, then variable dosing due to unstable renal function Cefepime 2g IV every 24h Monitor renal function, Cx and clinical progression to narrow Vancomycin level as needed     No data recorded.  No results for input(s): WBC, CREATININE, LATICACIDVEN, VANCOTROUGH, VANCOPEAK, VANCORANDOM, GENTTROUGH, GENTPEAK, GENTRANDOM, TOBRATROUGH, TOBRAPEAK, TOBRARND, AMIKACINPEAK, AMIKACINTROU, AMIKACIN in the last 168 hours.  CrCl cannot be calculated (Patient's most recent lab result is older than the maximum 21 days allowed.).    No Known Allergies  Antimicrobials this admission: Vanc 8/23>> Cefepime 8/23>>  Dose adjustments this admission: n/a  Microbiology results: 8/23 BCx: sent 8/23 UCx: sent   Bertis Ruddy, PharmD Clinical Pharmacist ED Pharmacist Phone # [phone] 02/22/2020 1:12 PM

## 2020-02-22 NOTE — H&P (Signed)
NAME:  Nathan [name], MRN:  [id number], DOB:  1945/07/14, LOS: 0 ADMISSION DATE:  02/22/2020, CONSULTATION DATE: 02/22/2020 REFERRING MD:  Fredia Sorrow, MD, CHIEF COMPLAINT:  Altered mental status  Brief History   74 year old male with  hypertension, diabetes, A. fib on anticoagulation which is on hold for elective lipoma resection and recent diagnosis of prostate cancer on radiation presented with altered mental status, noted to be in septic shock due to UTI and pneumonia.  History of present illness   74 year old male who was brought in the emergency department with altered mental status.  As per patient's daughter he was diagnosed with prostate cancer recently and has been on radiation therapy, he went to see urologist last week for regular checkup and was noted to have foul-smelling urine, urinary analysis was done, it came back negative, until yesterday he was doing well, in the evening patient started getting weaker, with poor appetite and this morning he started vomiting and diarrhea and was found to be completely confused so daughter called EMS and he was brought in the emergency department.  Of note patient was seen at the urology clinic today again for change of chronic indwelling Foley catheter.  Past Medical History  Diabetes Hypertension A. fib on Eliquis Left anterior chest lipoma Prostate cancer on radiation therapy  Significant Hospital Events   Admitted to PCCM  Consults:  N/A  Procedures:  N/A  Significant Diagnostic Tests:  CT chest/abdomen noncontrast: Left upper lobe and left lower lobe airspace opacities concerning for pneumonia. Moderate bilateral hydronephrosis and hydroureter. Foley catheter is in place with bladder decompressed, but bladder wall appears thickened, possibly related to prostate enlargement and bladder outlet obstruction.  Masslike soft tissue density projects into the bladder, possibly related to the enlarged prostate, but difficult to  exclude bladder wall mass.  Probable 5 mm bladder stone. 3 mm calcification projects near the right UVJ which could be at the UVJ or just into the bladder.  Left colonic diverticulosis. Concern for inflammatory strandingadjacent to the mid descending colon concerning for active diverticulitis.  Bilateral renal low-density lesions, likely cysts.  Micro Data:  8/23 Covid negative 8/23 urine culture >> 8/23 blood culture >>  Antimicrobials:  Vancomycin 8/23 >> Cefepime 8/23 >> Flagyl 8/23  Interim history/subjective:  Initially patient's blood pressure was stable, his lactate came back more than 11, he received 4 L of IV fluid, now his blood pressure dropped to systolic 70W and lactate is still 9.5.  Objective   Blood pressure (!) 84/60, pulse (!) 122, temperature (!) 100.8 F (38.2 C), resp. rate (!) 30, height 6\' 1"  (1.854 m), weight 78 kg, SpO2 98 %.        Intake/Output Summary (Last 24 hours) at 02/22/2020 1826 Last data filed at 02/22/2020 1639 Gross per 24 hour  Intake 2973.33 ml  Output --  Net 2973.33 ml   Filed Weights   02/22/20 1300 02/22/20 1717  Weight: 78 kg 78 kg    Examination:   Physical exam: General: Chronically ill-appearing male, lying on the bed HEAENT: [place]/AT, eyes anicteric. Neuro: Alert, awake and oriented x3.  Strength and sensation is equal bilaterally  Chest: Coarse crackles heard on left side, clear to auscultation on right, no wheezes or rhonchi.  There is soft tissue swelling on left anterior chest Heart: Regular rate and rhythm, no murmurs or gallops Abdomen: Soft, nontender, nondistended, bowel sounds present Skin: No rash, chronic changes GU: Indwelling Foley catheter in place  Resolved Hospital Problem list  N/A  Assessment & Plan:  Septic shock due to complicated UTI (indwelling Foley catheter) and left upper and lower lobe pneumonia possible aspiration Patient has received 4 L of IV fluid but his blood pressure continued to  remain in low 80s His lactate was more than 11 upon arrival, still it is elevated 9.5 Trend lactate UA is consistent with UTI Patient received vancomycin, cefepime and Flagyl in the emergency department Continue vancomycin and cefepime Follow-up culture and adjust antibiotics accordingly Patient may need low-dose vasopressors, will start with peripheral line but if he continued require high-dose pressors will place central line  Acute hypoxic respiratory failure due to possible aspiration pneumonia Patient has been on 4 L oxygen, able to maintain O2 sat around 92% CT chest confirmed left upper and lower lobe pneumonia Continue antibiotics Monitor O2 sat  Possible acute diverticulitis Patient presented with diarrhea and lower abdominal pain CT scan is consistent with possible acute diverticulitis Continue clear liquid diet Continue antibiotics  Acute kidney injury, likely combination of prerenal and obstructive CT scan is suggestive of bilateral hydronephrosis but Foley is in the bladder with compressed urinary bladder Continue IV fluid resuscitation Monitor serum creatinine, intake and output  Chronic A. fib with controlled rate Patient has chronic A. fib, he is on Eliquis at home for stroke prophylaxis, it was kept on hold for possible lipoma resection on anterior chest wall We will hold off anticoagulation considering acute diverticulitis We will hold metoprolol because of low blood pressure  Hypertension Patient is hypotensive at this time, will hold antihypertensive  Diabetes type 2 Continue lispro sliding scale  Best practice:  Diet: Clear liquid Pain/Anxiety/Delirium protocol (if indicated): None VAP protocol (if indicated): N/A DVT prophylaxis: Subcu heparin GI prophylaxis: Famotidine Glucose control: Sliding scale insulin Mobility: Bedrest now Code Status: Full Family Communication: Patient daughter was updated at bedside Disposition: ICU  Labs   CBC: Recent  Labs  Lab 02/22/20 1315  WBC 9.3  NEUTROABS 8.5*  HGB 11.9*  HCT 39.4  MCV 90.0  PLT 517    Basic Metabolic Panel: Recent Labs  Lab 02/22/20 1233 02/22/20 1715  NA 141  --   K 3.9  --   CL 102  --   CO2 16*  --   GLUCOSE 127*  --   BUN 61*  --   CREATININE 5.67*  --   CALCIUM 9.1  --   MG  --  1.2*  PHOS  --  2.2*   GFR: Estimated Creatinine Clearance: 12.2 mL/min (A) (by C-G formula based on SCr of 5.67 mg/dL (H)). Recent Labs  Lab 02/22/20 1233 02/22/20 1315 02/22/20 1555  WBC  --  9.3  --   LATICACIDVEN >11.0*  --  9.9*    Liver Function Tests: Recent Labs  Lab 02/22/20 1233  AST 44*  ALT 14  ALKPHOS 75  BILITOT 0.9  PROT 6.2*  ALBUMIN 2.8*   No results for input(s): LIPASE, AMYLASE in the last 168 hours. No results for input(s): AMMONIA in the last 168 hours.  ABG No results found for: PHART, PCO2ART, PO2ART, HCO3, TCO2, ACIDBASEDEF, O2SAT   Coagulation Profile: Recent Labs  Lab 02/22/20 1233  INR 1.8*    Cardiac Enzymes: Recent Labs  Lab 02/22/20 1715  CKTOTAL 336    HbA1C: No results found for: HGBA1C  CBG: No results for input(s): GLUCAP in the last 168 hours.  Review of Systems:   Review of system is positive for nausea, vomiting, diarrhea, headache and  shortness of breath Rest of the 12 point review of system is negative except above.  Past Medical History  He,  has a past medical history of Atrial fibrillation ([place]), Cancer of prostate ([place]), Chronic anticoagulation, Foley catheter in place, HTN (hypertension), Lipoma of chest wall, and WPW (Wolff-Parkinson-White syndrome).   Surgical History    Past Surgical History:  Procedure Laterality Date  . CATARACT EXTRACTION       Social History   reports that he quit smoking about 12 months ago. His smoking use included cigarettes. He has a 30.00 pack-year smoking history. He has quit using smokeless tobacco. He reports previous alcohol use. He reports that he does not use  drugs.   Family History   His family history includes Cervical cancer in his sister; Lung cancer in his mother. There is no history of Breast cancer, Pancreatic cancer, or Colon cancer.   Allergies No Known Allergies   Home Medications  Prior to Admission medications   Medication Sig Start Date End Date Taking? Authorizing Provider  folic acid (FOLVITE) 1 MG tablet Take 1 tablet by mouth daily. 03/24/19  Yes [provider]  lisinopril (ZESTRIL) 20 MG tablet Take 20 mg by mouth daily.   Yes [provider]  magnesium oxide (MAG-OX) 400 MG tablet Take 400 mg by mouth daily.  03/24/19  Yes [provider]  metoprolol succinate (TOPROL XL) 25 MG 24 hr tablet Take 1 tablet (25 mg total) by mouth daily. 06/05/19  Yes Hilty, Nadean Corwin, MD  Prenatal Vit-Fe Fumarate-FA (PRENATAL MULTIVITAMIN) TABS tablet Take 1 tablet by mouth daily at 12 noon.   Yes [provider]  vitamin B-12 (CYANOCOBALAMIN) 1000 MCG tablet Take 1,000 mcg by mouth daily.   Yes [provider]  Vitamin D, Ergocalciferol, (DRISDOL) 1.25 MG (50000 UNIT) CAPS capsule Take 50,000 Units by mouth every Sunday.    Yes [provider]  apixaban (ELIQUIS) 5 MG TABS tablet Take 1 tablet (5 mg total) by mouth 2 (two) times daily. Patient not taking: Reported on 02/16/2020 06/05/19   Pixie Casino, MD    Total critical care time: 55 minutes  Performed by: [place] care time was exclusive of separately billable procedures and treating other patients.   Critical care was necessary to treat or prevent imminent or life-threatening deterioration.   Critical care was time spent personally by me on the following activities: development of treatment plan with patient and/or surrogate as well as nursing, discussions with consultants, evaluation of patient's response to treatment, examination of patient, obtaining history from patient or surrogate, ordering and performing  treatments and interventions, ordering and review of laboratory studies, ordering and review of radiographic studies, pulse oximetry and re-evaluation of patient's condition.   Jacky Kindle MD Critical care physician [facility]  Pager: [phone] Mobile: [phone]

## 2020-02-22 NOTE — ED Notes (Signed)
Mentation improving. Pt following commands. Daughter at bedside.

## 2020-02-22 NOTE — ED Provider Notes (Addendum)
[facility] EMERGENCY DEPARTMENT Provider Note   CSN: [id number] Arrival date & time: 02/22/20  1216     History Chief Complaint  Patient presents with  . Hypotension  . Fever    Nathan [name] is a 74 y.o. male.  Patient brought in from home by EMS.  Patient was not himself today.  Stated he was not eating very well yesterday.  But none today really went get out of bed.  Patient is followed by cardiology for atrial fibrillation.  Supposed to be on Eliquis.  That has been held recently because he got a large lipoma on his left anterior chest that in early September they were going to removed.  Also has a history of diabetes and hypertension.  And past medical history mentions W PW syndrome.  Also known to have malignant neoplasm of the prostate.  Patient arrived hypotensive tachycardic tachypneic on 5 L of oxygen oxygen sats were 100%.  Patient not diaphoretic.  Patient denied any complaints of pain.  Initial blood pressure systolic was 80.  Heart rate was around 140.  On monitor appeared to be consistent with atrial fib.  Some history of some blood that was may be vomiting or dark in color.  Did not appear to be stool.  Patient was scheduled to have his indwelling Foley catheter changed out today by urology.        Past Medical History:  Diagnosis Date  . Atrial fibrillation ([place])   . Cancer of prostate ([place])   . Chronic anticoagulation   . Foley catheter in place   . HTN (hypertension)   . Lipoma of chest wall   . WPW (Wolff-Parkinson-White syndrome)    No prior ablation    Patient Active Problem List   Diagnosis Date Noted  . Septic shock ([place]) 02/22/2020  . Malignant neoplasm of prostate ([place]) 07/07/2019  . Atrial fibrillation ([place]) 06/05/2019  . Long term (current) use of anticoagulants 06/05/2019    Past Surgical History:  Procedure Laterality Date  . CATARACT EXTRACTION         Family History  Problem Relation Age of Onset  . Lung cancer  Mother   . Cervical cancer Sister   . Breast cancer Neg Hx   . Pancreatic cancer Neg Hx   . Colon cancer Neg Hx     Social History   Tobacco Use  . Smoking status: Former Smoker    Packs/day: 0.50    Years: 60.00    Pack years: 30.00    Types: Cigarettes    Quit date: 01/31/2019    Years since quitting: 1.0  . Smokeless tobacco: Former Network engineer  . Vaping Use: Never used  Substance Use Topics  . Alcohol use: Not Currently  . Drug use: Never    Home Medications Prior to Admission medications   Medication Sig Start Date End Date Taking? Authorizing Provider  folic acid (FOLVITE) 1 MG tablet Take 1 tablet by mouth daily. 03/24/19  Yes [provider]  lisinopril (ZESTRIL) 20 MG tablet Take 20 mg by mouth daily.   Yes [provider]  magnesium oxide (MAG-OX) 400 MG tablet Take 400 mg by mouth daily.  03/24/19  Yes [provider]  metoprolol succinate (TOPROL XL) 25 MG 24 hr tablet Take 1 tablet (25 mg total) by mouth daily. 06/05/19  Yes Hilty, Nadean Corwin, MD  Prenatal Vit-Fe Fumarate-FA (PRENATAL MULTIVITAMIN) TABS tablet Take 1 tablet by mouth daily at 12 noon.  Yes [provider]  vitamin B-12 (CYANOCOBALAMIN) 1000 MCG tablet Take 1,000 mcg by mouth daily.   Yes [provider]  Vitamin D, Ergocalciferol, (DRISDOL) 1.25 MG (50000 UNIT) CAPS capsule Take 50,000 Units by mouth every Sunday.    Yes [provider]  apixaban (ELIQUIS) 5 MG TABS tablet Take 1 tablet (5 mg total) by mouth 2 (two) times daily. Patient not taking: Reported on 02/16/2020 06/05/19   Pixie Casino, MD    Allergies    Patient has no known allergies.  Review of Systems   Review of Systems  Unable to perform ROS: Mental status change    Physical Exam Updated Vital Signs BP 94/66   Pulse 99   Temp 99.3 F (37.4 C)   Resp (!) 22   Ht 1.854 m ('6\' 1"' )   Wt 78 kg   SpO2 100%   BMI 22.69 kg/m   Physical Exam Vitals and nursing note  reviewed.  Constitutional:      Appearance: Normal appearance. He is well-developed.  HENT:     Head: Normocephalic and atraumatic.     Mouth/Throat:     Mouth: Mucous membranes are moist.     Comments: Oropharynx without any blood in it. Eyes:     Extraocular Movements: Extraocular movements intact.     Conjunctiva/sclera: Conjunctivae normal.     Pupils: Pupils are equal, round, and reactive to light.  Cardiovascular:     Rate and Rhythm: Tachycardia present. Rhythm irregular.     Heart sounds: No murmur heard.   Pulmonary:     Effort: Respiratory distress present.     Breath sounds: Normal breath sounds.  Abdominal:     Palpations: Abdomen is soft.     Tenderness: There is no abdominal tenderness.  Genitourinary:    Rectum: Guaiac result negative.     Comments: Foley catheter in place.  Stool dark but heme negative. Musculoskeletal:        General: No swelling.     Cervical back: Normal range of motion and neck supple.  Skin:    General: Skin is warm and dry.  Neurological:     Mental Status: He is alert.     Comments: Patient with some confusion.  But will move all extremities.  Will follow commands.     ED Results / Procedures / Treatments   Labs (all labs ordered are listed, but only abnormal results are displayed) Labs Reviewed  URINE CULTURE - Abnormal; Notable for the following components:      Result Value   Culture >=100,000 COLONIES/mL GRAM NEGATIVE RODS (*)    All other components within normal limits  BLOOD CULTURE ID PANEL (REFLEXED) - BCID2 - Abnormal; Notable for the following components:   Enterobacterales DETECTED (*)    Klebsiella aerogenes DETECTED (*)    Klebsiella pneumoniae DETECTED (*)    All other components within normal limits  LACTIC ACID, PLASMA - Abnormal; Notable for the following components:   Lactic Acid, Venous >11.0 (*)    All other components within normal limits  LACTIC ACID, PLASMA - Abnormal; Notable for the following  components:   Lactic Acid, Venous 9.9 (*)    All other components within normal limits  COMPREHENSIVE METABOLIC PANEL - Abnormal; Notable for the following components:   CO2 16 (*)    Glucose, Bld 127 (*)    BUN 61 (*)    Creatinine, Ser 5.67 (*)    Total Protein 6.2 (*)  Albumin 2.8 (*)    AST 44 (*)    GFR calc non Af Amer 9 (*)    GFR calc Af Amer 10 (*)    Anion gap 23 (*)    All other components within normal limits  PROTIME-INR - Abnormal; Notable for the following components:   Prothrombin Time 20.1 (*)    INR 1.8 (*)    All other components within normal limits  APTT - Abnormal; Notable for the following components:   aPTT 38 (*)    All other components within normal limits  URINALYSIS, ROUTINE W REFLEX MICROSCOPIC - Abnormal; Notable for the following components:   Color, Urine AMBER (*)    APPearance CLOUDY (*)    Hgb urine dipstick LARGE (*)    Protein, ur 100 (*)    Leukocytes,Ua LARGE (*)    RBC / HPF >50 (*)    Bacteria, UA MANY (*)    All other components within normal limits  CBC WITH DIFFERENTIAL/PLATELET - Abnormal; Notable for the following components:   Hemoglobin 11.9 (*)    Neutro Abs 8.5 (*)    Lymphs Abs 0.4 (*)    All other components within normal limits  MAGNESIUM - Abnormal; Notable for the following components:   Magnesium 1.2 (*)    All other components within normal limits  PHOSPHORUS - Abnormal; Notable for the following components:   Phosphorus 2.2 (*)    All other components within normal limits  PROTIME-INR - Abnormal; Notable for the following components:   Prothrombin Time 21.4 (*)    INR 1.9 (*)    All other components within normal limits  CORTISOL-AM, BLOOD - Abnormal; Notable for the following components:   Cortisol - AM 37.4 (*)    All other components within normal limits  CBC - Abnormal; Notable for the following components:   WBC 12.5 (*)    RBC 3.35 (*)    Hemoglobin 9.2 (*)    HCT 29.7 (*)    All other components  within normal limits  CREATININE, SERUM - Abnormal; Notable for the following components:   Creatinine, Ser 4.17 (*)    GFR calc non Af Amer 13 (*)    GFR calc Af Amer 15 (*)    All other components within normal limits  BASIC METABOLIC PANEL - Abnormal; Notable for the following components:   Glucose, Bld 124 (*)    BUN 65 (*)    Creatinine, Ser 3.20 (*)    Calcium 7.8 (*)    GFR calc non Af Amer 18 (*)    GFR calc Af Amer 21 (*)    All other components within normal limits  CBC - Abnormal; Notable for the following components:   WBC 14.2 (*)    RBC 3.75 (*)    Hemoglobin 10.4 (*)    HCT 32.9 (*)    Platelets 101 (*)    All other components within normal limits  LACTIC ACID, PLASMA - Abnormal; Notable for the following components:   Lactic Acid, Venous 2.7 (*)    All other components within normal limits  CBG MONITORING, ED - Abnormal; Notable for the following components:   Glucose-Capillary 129 (*)    All other components within normal limits  CBG MONITORING, ED - Abnormal; Notable for the following components:   Glucose-Capillary 113 (*)    All other components within normal limits  CULTURE, BLOOD (ROUTINE X 2)  CULTURE, BLOOD (ROUTINE X 2)  SARS CORONAVIRUS 2 BY RT  PCR (HOSPITAL ORDER, [facility])  MRSA PCR SCREENING  CK  PROCALCITONIN  GLUCOSE, CAPILLARY  CBC WITH DIFFERENTIAL/PLATELET  HEMOGLOBIN A1C  POC OCCULT BLOOD, ED  TYPE AND SCREEN  ABO/RH    EKG EKG Interpretation  Date/Time:  Monday February 22 2020 12:28:23 EDT Ventricular Rate:  145 PR Interval:    QRS Duration: 57 QT Interval:  290 QTC Calculation: 412 R Axis:   -20 Text Interpretation: Atrial fibrillation with RVR Multiform ventricular premature complexes Borderline left axis deviation Borderline low voltage, extremity leads Abnormal R-wave progression, early transition Borderline abnrm T, anterolateral leads No previous ECGs available Confirmed by Fredia Sorrow  [phone]) on 02/22/2020 12:34:21 PM   Radiology CT ABDOMEN PELVIS WO CONTRAST  Result Date: 02/22/2020 CLINICAL DATA:  Fever, abnormal x-ray.  Possible GI bleed. EXAM: CT CHEST, ABDOMEN AND PELVIS WITHOUT CONTRAST TECHNIQUE: Multidetector CT imaging of the chest, abdomen and pelvis was performed following the standard protocol without IV contrast. COMPARISON:  05/27/2019 FINDINGS: CT CHEST FINDINGS Cardiovascular: Heart is normal size. Calcifications throughout the coronary arteries and aorta. No evidence of aortic aneurysm. Mediastinum/Nodes: No mediastinal, hilar, or axillary adenopathy. Trachea and esophagus are unremarkable. Thyroid unremarkable. Lungs/Pleura: Patchy ground-glass airspace disease in the left upper lobe. Nodule in the left upper lobe on image 68 measures 6 mm. Airspace disease more inferiorly in the lingula and in the left lower lobe concerning for pneumonia. Right base posterior dependent atelectasis. No effusions. Musculoskeletal: Left-sided gynecomastia. No acute bony abnormality. CT ABDOMEN PELVIS FINDINGS Hepatobiliary: No focal hepatic abnormality. Gallbladder unremarkable. Pancreas: No focal abnormality or ductal dilatation. Spleen: No focal abnormality.  Normal size. Adrenals/Urinary Tract: Bilateral hydronephrosis. Urinary bladder wall is thickened with Foley catheter in place. Soft tissue masslike density seen within the inferior aspect of the bladder measuring 5.2 x 3.8 cm, possibly markedly enlarged prostate. 5 mm calcification within the bladder. 3 mm calcification noted along the right posterior bladder wall which could be within the bladder lumen or at the right UVJ. Bilateral low-density lesions within the kidneys, likely cysts although these are difficult to characterize on this noncontrast study. Adrenal glands unremarkable. Stomach/Bowel: Large stool burden in the rectosigmoid colon. Left colonic diverticulosis. Possible inflammation/stranding around the mid descending colon  suggests possibility of diverticulitis. Small bowel is fluid-filled, nondilated. Vascular/Lymphatic: Aortoiliac atherosclerosis. No aneurysm or adenopathy. Reproductive: Markedly enlarged prostate. Other: No free fluid or free air. Musculoskeletal: No acute bony abnormality. IMPRESSION: Left upper lobe and left lower lobe airspace opacities concerning for pneumonia. Coronary artery disease, aortic atherosclerosis. Right base atelectasis. Asymmetric left gynecomastia. Recommend clinical correlation and possible correlation with mammography to exclude breast cancer. Moderate bilateral hydronephrosis and hydroureter. Foley catheter is in place with bladder decompressed, but bladder wall appears thickened, possibly related to prostate enlargement and bladder outlet obstruction. Masslike soft tissue density projects into the bladder, possibly related to the enlarged prostate, but difficult to exclude bladder wall mass. Probable 5 mm bladder stone. 3 mm calcification projects near the right UVJ which could be at the UVJ or just into the bladder. Left colonic diverticulosis. Concern for inflammatory stranding adjacent to the mid descending colon concerning for active diverticulitis. Bilateral renal low-density lesions, likely cysts. Electronically Signed   By: Rolm Baptise M.D.   On: 02/22/2020 18:19   CT Chest Wo Contrast  Result Date: 02/22/2020 CLINICAL DATA:  Fever, abnormal x-ray.  Possible GI bleed. EXAM: CT CHEST, ABDOMEN AND PELVIS WITHOUT CONTRAST TECHNIQUE: Multidetector CT imaging of the chest, abdomen  and pelvis was performed following the standard protocol without IV contrast. COMPARISON:  05/27/2019 FINDINGS: CT CHEST FINDINGS Cardiovascular: Heart is normal size. Calcifications throughout the coronary arteries and aorta. No evidence of aortic aneurysm. Mediastinum/Nodes: No mediastinal, hilar, or axillary adenopathy. Trachea and esophagus are unremarkable. Thyroid unremarkable. Lungs/Pleura: Patchy  ground-glass airspace disease in the left upper lobe. Nodule in the left upper lobe on image 68 measures 6 mm. Airspace disease more inferiorly in the lingula and in the left lower lobe concerning for pneumonia. Right base posterior dependent atelectasis. No effusions. Musculoskeletal: Left-sided gynecomastia. No acute bony abnormality. CT ABDOMEN PELVIS FINDINGS Hepatobiliary: No focal hepatic abnormality. Gallbladder unremarkable. Pancreas: No focal abnormality or ductal dilatation. Spleen: No focal abnormality.  Normal size. Adrenals/Urinary Tract: Bilateral hydronephrosis. Urinary bladder wall is thickened with Foley catheter in place. Soft tissue masslike density seen within the inferior aspect of the bladder measuring 5.2 x 3.8 cm, possibly markedly enlarged prostate. 5 mm calcification within the bladder. 3 mm calcification noted along the right posterior bladder wall which could be within the bladder lumen or at the right UVJ. Bilateral low-density lesions within the kidneys, likely cysts although these are difficult to characterize on this noncontrast study. Adrenal glands unremarkable. Stomach/Bowel: Large stool burden in the rectosigmoid colon. Left colonic diverticulosis. Possible inflammation/stranding around the mid descending colon suggests possibility of diverticulitis. Small bowel is fluid-filled, nondilated. Vascular/Lymphatic: Aortoiliac atherosclerosis. No aneurysm or adenopathy. Reproductive: Markedly enlarged prostate. Other: No free fluid or free air. Musculoskeletal: No acute bony abnormality. IMPRESSION: Left upper lobe and left lower lobe airspace opacities concerning for pneumonia. Coronary artery disease, aortic atherosclerosis. Right base atelectasis. Asymmetric left gynecomastia. Recommend clinical correlation and possible correlation with mammography to exclude breast cancer. Moderate bilateral hydronephrosis and hydroureter. Foley catheter is in place with bladder decompressed, but  bladder wall appears thickened, possibly related to prostate enlargement and bladder outlet obstruction. Masslike soft tissue density projects into the bladder, possibly related to the enlarged prostate, but difficult to exclude bladder wall mass. Probable 5 mm bladder stone. 3 mm calcification projects near the right UVJ which could be at the UVJ or just into the bladder. Left colonic diverticulosis. Concern for inflammatory stranding adjacent to the mid descending colon concerning for active diverticulitis. Bilateral renal low-density lesions, likely cysts. Electronically Signed   By: Rolm Baptise M.D.   On: 02/22/2020 18:19   DG Chest Portable 1 View  Result Date: 02/22/2020 CLINICAL DATA:  Shortness of breath, altered mental status EXAM: PORTABLE CHEST 1 VIEW COMPARISON:  None. FINDINGS: Patchy left lower lobe airspace disease. No pleural effusion or pneumothorax. Stable cardiomediastinal silhouette. No aggressive osseous lesion. IMPRESSION: Left lower lobe pneumonia. Electronically Signed   By: Kathreen Devoid   On: 02/22/2020 13:08    Procedures Procedures (including critical care time). CRITICAL CARE Performed by: Fredia Sorrow Total critical care time: 45 minutes Critical care time was exclusive of separately billable procedures and treating other patients. Critical care was necessary to treat or prevent imminent or life-threatening deterioration. Critical care was time spent personally by me on the following activities: development of treatment plan with patient and/or surrogate as well as nursing, discussions with consultants, evaluation of patient's response to treatment, examination of patient, obtaining history from patient or surrogate, ordering and performing treatments and interventions, ordering and review of laboratory studies, ordering and review of radiographic studies, pulse oximetry and re-evaluation of patient's condition.   Medications Ordered in ED Medications  lactated  ringers infusion ( Intravenous Stopped  02/23/20 0858)  ceFEPIme (MAXIPIME) 2 g in sodium chloride 0.9 % 100 mL IVPB (has no administration in time range)  heparin injection 5,000 Units (5,000 Units Subcutaneous Given 02/23/20 0136)  sodium chloride flush (NS) 0.9 % injection 3 mL (has no administration in time range)  sodium chloride flush (NS) 0.9 % injection 3 mL (has no administration in time range)  0.9 %  sodium chloride infusion (has no administration in time range)  dextrose 5 % in lactated ringers infusion ( Intravenous New Bag/Given 02/22/20 2144)  norepinephrine (LEVOPHED) 34m in 2558mpremix infusion (10 mcg/min Intravenous New Bag/Given 02/23/20 0213)  docusate sodium (COLACE) capsule 100 mg (has no administration in time range)  polyethylene glycol (MIRALAX / GLYCOLAX) packet 17 g (has no administration in time range)  insulin aspart (novoLOG) injection 0-15 Units (0 Units Subcutaneous Not Given 02/23/20 1235)  insulin aspart (novoLOG) injection 0-5 Units (0 Units Subcutaneous Not Given 02/23/20 0138)  lactated ringers bolus 1,000 mL (0 mLs Intravenous Stopped 02/22/20 1315)    And  lactated ringers bolus 1,000 mL (0 mLs Intravenous Stopped 02/22/20 1415)    And  lactated ringers bolus 500 mL (0 mLs Intravenous Stopped 02/22/20 1511)  ceFEPIme (MAXIPIME) 2 g in sodium chloride 0.9 % 100 mL IVPB (0 g Intravenous Stopped 02/22/20 1405)  metroNIDAZOLE (FLAGYL) IVPB 500 mg (0 mg Intravenous Stopped 02/22/20 1444)  vancomycin (VANCOREADY) IVPB 1500 mg/300 mL (0 mg Intravenous Stopped 02/22/20 1639)  acetaminophen (TYLENOL) suppository 975 mg (975 mg Rectal Given 02/22/20 1448)  lactated ringers bolus 1,000 mL (0 mLs Intravenous Stopped 02/22/20 1827)  sodium chloride 0.9 % bolus 500 mL (0 mLs Intravenous Stopped 02/23/20 1113)    ED Course  I have reviewed the triage vital signs and the nursing notes.  Pertinent labs & imaging results that were available during my care of the patient were  reviewed by me and considered in my medical decision making (see chart for details).    MDM Rules/Calculators/A&P                          Discussed with critical care.  They initially stated they were going to come see the patient.  But then after talking to their attending decided they want to wait for repeat lactic acid.  Patient's initial lactic acid was greater than 11.  Which usually the hospitalist will not admit.  Patient's blood pressures with the 30 cc/kg fluid challenge are improving.  His lowest was 80.  Currently is around 93 systolic.  Patient's had a longstanding indwelling Foley catheter.  Labs are consistent with acute kidney injury.  But this may very well be prerenal.  Because he is making a lot of urine.  With the fluids.  Known to have a history of prostate cancer.  Also has a large lipoma to the left anterior chest that was scheduled to be removed in September.  Patient is on Eliquis.  That has been stopped in preparation for that surgery.  No leukocytosis.  Covid testing is negative.  Patient met sepsis criteria.  Received 30 cc/kg fluid challenge.  Still tachycardic but has a history of atrial fib.  Blood pressures are improving with systolics around 93.  Chest x-ray consistent with left lower lobe pneumonia.  Urine also consistent probably with urinary tract infection culture sent.  Patient story of some vomiting of blood or blood at home that was dark.  Rectal is heme-negative.  Evening emergency physician  Dr. Vallery Ridge will contact either hospitalist or critical care team wants the second lactic acid is back.  And also reassess where patient is with his blood pressures.  Final Clinical Impression(s) / ED Diagnoses Final diagnoses:  Sepsis, due to unspecified organism, unspecified whether acute organ dysfunction present [facility])  Community acquired pneumonia of left lower lobe of lung  Respiration abnormal  AKI (acute kidney injury) [facility])    Rx / DC Orders ED Discharge  Orders    None       Fredia Sorrow, MD 02/22/20 1527    Fredia Sorrow, MD 02/23/20 1243

## 2020-02-22 NOTE — ED Triage Notes (Signed)
Per [facility], patient has had altered mental status for the past two days but became nonverbal this morning. Large amount of blood noted at patients bedside. No one could say if it was vomit or rectal bleeding. He also has chronic indwelling catheter.

## 2020-02-22 NOTE — ED Notes (Addendum)
PT to CT.  Mentation greatly improved.  Pt requesting gown, etc.

## 2020-02-22 NOTE — ED Notes (Signed)
Got patient undressed on the monitor did ekg shown to Dr Farris Has patient is resting with nurse at bedside

## 2020-02-22 NOTE — ED Provider Notes (Signed)
Plan is for admission to intensivist service.  Intensivist has been consulted by Dr. Rogene Houston.  They request that patient get repeat lactic acid to determine if he is appropriate for intensivist service.  This is pending. Physical Exam  BP (!) 84/66   Pulse (!) 124   Temp (!) 101.6 F (38.7 C)   Resp (!) 32   Wt 78 kg   SpO2 100%   BMI 22.69 kg/m   Physical Exam Patient mild somnolent and slightly confused but responding to questions without respiratory distress.  Heart regularly irregular, tachycardia.  Lungs grossly clear.  Abdomen soft without guarding.  No significant peripheral edema.  No focal neurologic deficits but generally mild confusion. ED Course/Procedures     Procedures CRITICAL CARE Performed by: Charlesetta Shanks   Total critical care time: 30 minutes  Critical care time was exclusive of separately billable procedures and treating other patients.  Critical care was necessary to treat or prevent imminent or life-threatening deterioration.  Critical care was time spent personally by me on the following activities: development of treatment plan with patient and/or surrogate as well as nursing, discussions with consultants, evaluation of patient's response to treatment, examination of patient, obtaining history from patient or surrogate, ordering and performing treatments and interventions, ordering and review of laboratory studies, ordering and review of radiographic studies, pulse oximetry and re-evaluation of patient's condition. MDM  Lactic acid is still pending.  Patient's blood pressures appear to be softening in the systolic 09N.  Will reevaluate for clinical condition, need for additional fluid bolus and/or pressor support and review with intensivist service for anticipated admission.  Repeat fluid bolus administered.  After discussion with the patient's daughter some indication he has been experiencing some right sided abdominal or lower chest pain.  Without evident  source for sepsis, will proceed with CT scans as well.  Patient will be admitted to intensivist service for persistent hypotension.  With fluid resuscitation, however patient does appear to be improving and mental status is improving.       Charlesetta Shanks, MD 03/03/20 [phone]

## 2020-02-23 DIAGNOSIS — J9601 Acute respiratory failure with hypoxia: Secondary | ICD-10-CM

## 2020-02-23 DIAGNOSIS — E872 Acidosis: Secondary | ICD-10-CM

## 2020-02-23 LAB — CBC
HCT: 32.9 % — ABNORMAL LOW (ref 39.0–52.0)
Hemoglobin: 10.4 g/dL — ABNORMAL LOW (ref 13.0–17.0)
MCH: 27.7 pg (ref 26.0–34.0)
MCHC: 31.6 g/dL (ref 30.0–36.0)
MCV: 87.7 fL (ref 80.0–100.0)
Platelets: 101 10*3/uL — ABNORMAL LOW (ref 150–400)
RBC: 3.75 MIL/uL — ABNORMAL LOW (ref 4.22–5.81)
RDW: 13.8 % (ref 11.5–15.5)
WBC: 14.2 10*3/uL — ABNORMAL HIGH (ref 4.0–10.5)
nRBC: 0 % (ref 0.0–0.2)

## 2020-02-23 LAB — BLOOD CULTURE ID PANEL (REFLEXED) - BCID2
A.calcoaceticus-baumannii: NOT DETECTED
Bacteroides fragilis: NOT DETECTED
CTX-M ESBL: NOT DETECTED
Candida albicans: NOT DETECTED
Candida auris: NOT DETECTED
Candida glabrata: NOT DETECTED
Candida krusei: NOT DETECTED
Candida parapsilosis: NOT DETECTED
Candida tropicalis: NOT DETECTED
Carbapenem resist OXA 48 LIKE: NOT DETECTED
Carbapenem resistance IMP: NOT DETECTED
Carbapenem resistance KPC: NOT DETECTED
Carbapenem resistance NDM: NOT DETECTED
Carbapenem resistance VIM: NOT DETECTED
Cryptococcus neoformans/gattii: NOT DETECTED
Enterobacter cloacae complex: NOT DETECTED
Enterobacterales: DETECTED — AB
Enterococcus Faecium: NOT DETECTED
Enterococcus faecalis: NOT DETECTED
Escherichia coli: NOT DETECTED
Haemophilus influenzae: NOT DETECTED
Klebsiella aerogenes: DETECTED — AB
Klebsiella oxytoca: NOT DETECTED
Klebsiella pneumoniae: DETECTED — AB
Listeria monocytogenes: NOT DETECTED
Neisseria meningitidis: NOT DETECTED
Proteus species: NOT DETECTED
Pseudomonas aeruginosa: NOT DETECTED
Salmonella species: NOT DETECTED
Serratia marcescens: NOT DETECTED
Staphylococcus aureus (BCID): NOT DETECTED
Staphylococcus epidermidis: NOT DETECTED
Staphylococcus lugdunensis: NOT DETECTED
Staphylococcus species: NOT DETECTED
Stenotrophomonas maltophilia: NOT DETECTED
Streptococcus agalactiae: NOT DETECTED
Streptococcus pneumoniae: NOT DETECTED
Streptococcus pyogenes: NOT DETECTED
Streptococcus species: NOT DETECTED

## 2020-02-23 LAB — BASIC METABOLIC PANEL
Anion gap: 11 (ref 5–15)
BUN: 65 mg/dL — ABNORMAL HIGH (ref 8–23)
CO2: 24 mmol/L (ref 22–32)
Calcium: 7.8 mg/dL — ABNORMAL LOW (ref 8.9–10.3)
Chloride: 106 mmol/L (ref 98–111)
Creatinine, Ser: 3.2 mg/dL — ABNORMAL HIGH (ref 0.61–1.24)
GFR calc Af Amer: 21 mL/min — ABNORMAL LOW (ref >60)
GFR calc non Af Amer: 18 mL/min — ABNORMAL LOW (ref >60)
Glucose, Bld: 124 mg/dL — ABNORMAL HIGH (ref 70–99)
Potassium: 4.3 mmol/L (ref 3.5–5.1)
Sodium: 141 mmol/L (ref 135–145)

## 2020-02-23 LAB — GLUCOSE, CAPILLARY
Glucose-Capillary: 99 mg/dL (ref 70–99)
Glucose-Capillary: 137 mg/dL — ABNORMAL HIGH (ref 70–99)
Glucose-Capillary: 123 mg/dL — ABNORMAL HIGH (ref 70–99)

## 2020-02-23 LAB — PROTIME-INR
INR: 1.9 — ABNORMAL HIGH (ref 0.8–1.2)
Prothrombin Time: 21.4 seconds — ABNORMAL HIGH (ref 11.4–15.2)

## 2020-02-23 LAB — LACTIC ACID, PLASMA: Lactic Acid, Venous: 2.7 mmol/L (ref 0.5–1.9)

## 2020-02-23 LAB — CBG MONITORING, ED
Glucose-Capillary: 129 mg/dL — ABNORMAL HIGH (ref 70–99)
Glucose-Capillary: 113 mg/dL — ABNORMAL HIGH (ref 70–99)

## 2020-02-23 LAB — HEMOGLOBIN A1C
Hgb A1c MFr Bld: 5.7 % — ABNORMAL HIGH (ref 4.8–5.6)
Mean Plasma Glucose: 117 mg/dL

## 2020-02-23 LAB — MRSA PCR SCREENING: MRSA by PCR: NEGATIVE

## 2020-02-23 LAB — PROCALCITONIN: Procalcitonin: >150 ng/mL

## 2020-02-23 LAB — CORTISOL-AM, BLOOD: Cortisol - AM: 37.4 ug/dL — ABNORMAL HIGH (ref 6.7–22.6)

## 2020-02-23 MED ORDER — ACETAMINOPHEN 325 MG PO TABS
650.0000 mg | ORAL_TABLET | Freq: Four times a day (QID) | ORAL | Status: DC | PRN
  Administered 2020-02-23 – 2020-02-26 (×5): 650 mg via ORAL
  Filled 2020-02-23 (×5): qty 2

## 2020-02-23 MED ORDER — CHLORHEXIDINE GLUCONATE CLOTH 2 % EX PADS
6.0000 | MEDICATED_PAD | Freq: Every day | CUTANEOUS | Status: DC
  Administered 2020-02-23 – 2020-02-26 (×4): 6 via TOPICAL

## 2020-02-23 MED ORDER — LACTATED RINGERS IV BOLUS: 1000.0000 mL | Freq: Once | INTRAVENOUS | Status: DC

## 2020-02-23 MED ORDER — SODIUM CHLORIDE 0.9 % IV BOLUS
500.0000 mL | Freq: Once | INTRAVENOUS | Status: AC
  Administered 2020-02-23: 500 mL via INTRAVENOUS

## 2020-02-23 NOTE — Plan of Care (Signed)
  Problem: Elimination: Goal: Will not experience complications related to bowel motility Outcome: Progressing Note: Pt had bowel movement today

## 2020-02-23 NOTE — Progress Notes (Signed)
NAME:  Nathan [name], MRN:  [id number], DOB:  1945-10-16, LOS: 1 ADMISSION DATE:  02/22/2020, CONSULTATION DATE: 02/22/2020 REFERRING MD:  Fredia Sorrow, MD, CHIEF COMPLAINT:  Altered mental status  Brief History   74 year old male with  hypertension, diabetes, A. fib on anticoagulation which is on hold for elective lipoma resection and recent diagnosis of prostate cancer on radiation presented with altered mental status, noted to be in septic shock due to UTI and pneumonia.  History of present illness   74 year old male who was brought in the emergency department with altered mental status.  As per patient's daughter he was diagnosed with prostate cancer recently and has been on radiation therapy, he went to see urologist last week for regular checkup and was noted to have foul-smelling urine, urinary analysis was done, it came back negative, until yesterday he was doing well, in the evening patient started getting weaker, with poor appetite and this morning he started vomiting and diarrhea and was found to be completely confused so daughter called EMS and he was brought in the emergency department.  Of note patient was seen at the urology clinic today again for change of chronic indwelling Foley catheter.  Past Medical History  Diabetes Hypertension A. fib on Eliquis Left anterior chest lipoma Prostate cancer on radiation therapy  Significant Hospital Events   Admitted to PCCM  Consults:  N/A  Procedures:  N/A  Significant Diagnostic Tests:  CT chest/abdomen noncontrast: Left upper lobe and left lower lobe airspace opacities concerning for pneumonia. Moderate bilateral hydronephrosis and hydroureter. Foley catheter is in place with bladder decompressed, but bladder wall appears thickened, possibly related to prostate enlargement and bladder outlet obstruction.  Masslike soft tissue density projects into the bladder, possibly related to the enlarged prostate, but difficult to  exclude bladder wall mass.  Probable 5 mm bladder stone. 3 mm calcification projects near the right UVJ which could be at the UVJ or just into the bladder.  Left colonic diverticulosis. Concern for inflammatory strandingadjacent to the mid descending colon concerning for active diverticulitis.  Bilateral renal low-density lesions, likely cysts.  Micro Data:  8/23 Covid negative 8/23 urine culture >> 8/23 blood culture >>  Antimicrobials:  Vancomycin 8/23 >> Cefepime 8/23 >> Flagyl 8/23  Interim history/subjective:  Noted to have Klebsiella in 1 of 2 blood cultures. On broad-spectrum antibiotics. Does not appear in acute distress. Currently on Levophed at 10 mics.  Objective   Blood pressure 94/66, pulse 99, temperature 99.3 F (37.4 C), resp. rate (!) 22, height 6\' 1"  (1.854 m), weight 78 kg, SpO2 100 %.        Intake/Output Summary (Last 24 hours) at 02/23/2020 0818 Last data filed at 02/22/2020 2153 Gross per 24 hour  Intake 3973.33 ml  Output 1200 ml  Net 2773.33 ml   Filed Weights   02/22/20 1300 02/22/20 1717  Weight: 78 kg 78 kg    Examination:   General: Elderly male on nasal cannula no acute distress awake and alert. HEENT: No JVD is appreciated Neuro: Grossly intact no focal defect CV: Heart sounds are regular regular rhythm currently on 10 mcg of Levophed PULM: Diminished in the bases mild rhonchi Currently on 4 L with sats of 96%. Left chest wall with soft masslike over the anterior chest GI: soft, bsx4 active  GU: Foley catheter in place with amber urine Extremities: warm/dry,  edema  Skin: no rashes or lesions    Resolved Hospital Problem list   N/A  Assessment &  Plan:  Septic shock due to complicated UTI (indwelling Foley catheter) and left upper and lower lobe pneumonia possible aspiration  Last lactic acid 11.0 we will recheck Noted to have blood culture 1 of 2+ for Klebsiella therefore continue antimicrobial therapy Continue fluid  resuscitation Wean Levophed as able Place central line if high dose pressors are needed Transfer to intensive care unit as soon as possible.    Acute hypoxic respiratory failure due to possible aspiration pneumonia Patient has been on 4 L oxygen, able to maintain O2 sat around 92% CT chest confirmed left upper and lower lobe pneumonia  Currently on antimicrobial therapy Wean O2 as tolerated  Possible acute diverticulitis Patient presented with diarrhea and lower abdominal pain CT scan is consistent with possible acute diverticulitis  Liquid diet Broad-spectrum antibiotics   Acute kidney injury, likely combination of prerenal and obstructive CT scan is suggestive of bilateral hydronephrosis but Foley is in the bladder with compressed urinary bladder Lab Results  Component Value Date   CREATININE 4.17 (H) 02/22/2020   CREATININE 5.67 (H) 02/22/2020   CREATININE 1.01 06/09/2019   Recent Labs  Lab 02/22/20 1233  K 3.9  CT reveals urinary wall is thickened. Soft tissue masslike density inferior aspect of the bladder. Continued hydronephrosis bilaterally.  Consider urology consult he is followed by Dr. Raynelle Bring. Questionable oncology follow-up he follows [facility]. Continue to follow creatinine note improvement Continue fluid resuscitation  Chronic A. fib with controlled rate Patient has chronic A. fib, he is on Eliquis at home for stroke prophylaxis, it was kept on hold for possible lipoma resection on anterior chest wall  Anticoagulation holding Too ill for any invasive procedures  Hypertension.hypotensive Currently hypotensive on Levophed drip therefore holding antihypertensives  Diabetes type 2 CBG (last 3)  Recent Labs    02/23/20 0117  GLUCAP 129*    Sliding-scale insulin protocol  Best practice:  Diet: Clear liquid Pain/Anxiety/Delirium protocol (if indicated): None VAP protocol (if indicated): N/A DVT prophylaxis: Subcu heparin GI  prophylaxis: Famotidine Glucose control: Sliding scale insulin Mobility: Bedrest now Code Status: Full Family Communication:  8/23 2021 patient updated at bedside Disposition: ICU  Labs   CBC: Recent Labs  Lab 02/22/20 1315 02/22/20 1906  WBC 9.3 12.5*  NEUTROABS 8.5*  --   HGB 11.9* 9.2*  HCT 39.4 29.7*  MCV 90.0 88.7  PLT 168 PLATELET CLUMPS NOTED ON SMEAR, UNABLE TO ESTIMATE    Basic Metabolic Panel: Recent Labs  Lab 02/22/20 1233 02/22/20 1715 02/22/20 1906  NA 141  --   --   K 3.9  --   --   CL 102  --   --   CO2 16*  --   --   GLUCOSE 127*  --   --   BUN 61*  --   --   CREATININE 5.67*  --  4.17*  CALCIUM 9.1  --   --   MG  --  1.2*  --   PHOS  --  2.2*  --    GFR: Estimated Creatinine Clearance: 16.6 mL/min (A) (by C-G formula based on SCr of 4.17 mg/dL (H)). Recent Labs  Lab 02/22/20 1233 02/22/20 1315 02/22/20 1555 02/22/20 1906  WBC  --  9.3  --  12.5*  LATICACIDVEN >11.0*  --  9.9*  --     Liver Function Tests: Recent Labs  Lab 02/22/20 1233  AST 44*  ALT 14  ALKPHOS 75  BILITOT 0.9  PROT 6.2*  ALBUMIN 2.8*  No results for input(s): LIPASE, AMYLASE in the last 168 hours. No results for input(s): AMMONIA in the last 168 hours.  ABG No results found for: PHART, PCO2ART, PO2ART, HCO3, TCO2, ACIDBASEDEF, O2SAT   Coagulation Profile: Recent Labs  Lab 02/22/20 1233  INR 1.8*    Cardiac Enzymes: Recent Labs  Lab 02/22/20 1715  CKTOTAL 336    HbA1C: No results found for: HGBA1C  CBG: Recent Labs  Lab 02/23/20 0117  GLUCAP 129*     App cct 34 min  Richardson Landry Tuvia Woodrick ACNP Acute Care Nurse Practitioner [place] Please consult [place] 02/23/2020, 8:19 AM

## 2020-02-23 NOTE — Progress Notes (Deleted)
a 

## 2020-02-23 NOTE — Progress Notes (Signed)
PHARMACY - PHYSICIAN COMMUNICATION CRITICAL VALUE ALERT - BLOOD CULTURE IDENTIFICATION (BCID)  Nathan [name] is an 74 y.o. male who presented to [facility] on [facility]/22/2020 with a chief complaint of sepsis  Assessment:  Pt with Kleb aerogenes and Kleb pneumo in 4/4 blood culture bottles   Name of physician (or Provider) Contacted: Kamat  Current antibiotics: Vancomycin and Cefepime  Changes to prescribed antibiotics recommended:  Continue Cefepime and Vancomycin. Recommendation to d/c Vanc and [name].  Results for orders placed or performed during the hospital encounter of 02/22/20  Blood Culture ID Panel (Reflexed) (Collected: 02/22/2020 12:33 PM)  Result Value Ref Range   Enterococcus faecalis NOT DETECTED NOT DETECTED   Enterococcus Faecium NOT DETECTED NOT DETECTED   Listeria monocytogenes NOT DETECTED NOT DETECTED   Staphylococcus species NOT DETECTED NOT DETECTED   Staphylococcus aureus (BCID) NOT DETECTED NOT DETECTED   Staphylococcus epidermidis NOT DETECTED NOT DETECTED   Staphylococcus lugdunensis NOT DETECTED NOT DETECTED   Streptococcus species NOT DETECTED NOT DETECTED   Streptococcus agalactiae NOT DETECTED NOT DETECTED   Streptococcus pneumoniae NOT DETECTED NOT DETECTED   Streptococcus pyogenes NOT DETECTED NOT DETECTED   A.calcoaceticus-baumannii NOT DETECTED NOT DETECTED   Bacteroides fragilis NOT DETECTED NOT DETECTED   Enterobacterales DETECTED (A) NOT DETECTED   Enterobacter cloacae complex NOT DETECTED NOT DETECTED   Escherichia coli NOT DETECTED NOT DETECTED   Klebsiella aerogenes DETECTED (A) NOT DETECTED   Klebsiella oxytoca NOT DETECTED NOT DETECTED   Klebsiella pneumoniae DETECTED (A) NOT DETECTED   Proteus species NOT DETECTED NOT DETECTED   Salmonella species NOT DETECTED NOT DETECTED   Serratia marcescens NOT DETECTED NOT DETECTED   Haemophilus influenzae NOT DETECTED NOT DETECTED   Neisseria  meningitidis NOT DETECTED NOT DETECTED   Pseudomonas aeruginosa NOT DETECTED NOT DETECTED   Stenotrophomonas maltophilia NOT DETECTED NOT DETECTED   Candida albicans NOT DETECTED NOT DETECTED   Candida auris NOT DETECTED NOT DETECTED   Candida glabrata NOT DETECTED NOT DETECTED   Candida krusei NOT DETECTED NOT DETECTED   Candida parapsilosis NOT DETECTED NOT DETECTED   Candida tropicalis NOT DETECTED NOT DETECTED   Cryptococcus neoformans/gattii NOT DETECTED NOT DETECTED   CTX-M ESBL NOT DETECTED NOT DETECTED   Carbapenem resistance IMP NOT DETECTED NOT DETECTED   Carbapenem resistance KPC NOT DETECTED NOT DETECTED   Carbapenem resistance NDM NOT DETECTED NOT DETECTED   Carbapenem resist OXA 48 LIKE NOT DETECTED NOT DETECTED   Carbapenem resistance VIM NOT DETECTED NOT DETECTED    Sherlon Handing, PharmD, BCPS Please see amion for complete clinical pharmacist phone list 02/23/2020  6:46 AM

## 2020-02-23 NOTE — ED Notes (Signed)
Pt resting quietly at this time with no complaints voiced 

## 2020-02-24 ENCOUNTER — Encounter (HOSPITAL_COMMUNITY): Admission: RE | Admit: 2020-02-24 | Payer: Medicare Other | Source: Ambulatory Visit

## 2020-02-24 ENCOUNTER — Inpatient Hospital Stay (HOSPITAL_COMMUNITY): Payer: Medicare Other

## 2020-02-24 DIAGNOSIS — J189 Pneumonia, unspecified organism: Secondary | ICD-10-CM

## 2020-02-24 LAB — BASIC METABOLIC PANEL
Anion gap: 12 (ref 5–15)
BUN: 56 mg/dL — ABNORMAL HIGH (ref 8–23)
CO2: 23 mmol/L (ref 22–32)
Calcium: 8.1 mg/dL — ABNORMAL LOW (ref 8.9–10.3)
Chloride: 107 mmol/L (ref 98–111)
Creatinine, Ser: 1.84 mg/dL — ABNORMAL HIGH (ref 0.61–1.24)
GFR calc Af Amer: 40 mL/min — ABNORMAL LOW (ref >60)
GFR calc non Af Amer: 35 mL/min — ABNORMAL LOW (ref >60)
Glucose, Bld: 102 mg/dL — ABNORMAL HIGH (ref 70–99)
Potassium: 4 mmol/L (ref 3.5–5.1)
Sodium: 142 mmol/L (ref 135–145)

## 2020-02-24 LAB — CBC
HCT: 30.2 % — ABNORMAL LOW (ref 39.0–52.0)
Hemoglobin: 9.8 g/dL — ABNORMAL LOW (ref 13.0–17.0)
MCH: 27.8 pg (ref 26.0–34.0)
MCHC: 32.5 g/dL (ref 30.0–36.0)
MCV: 85.6 fL (ref 80.0–100.0)
Platelets: 99 10*3/uL — ABNORMAL LOW (ref 150–400)
RBC: 3.53 MIL/uL — ABNORMAL LOW (ref 4.22–5.81)
RDW: 13.7 % (ref 11.5–15.5)
WBC: 13.4 10*3/uL — ABNORMAL HIGH (ref 4.0–10.5)
nRBC: 0 % (ref 0.0–0.2)

## 2020-02-24 LAB — PHOSPHORUS: Phosphorus: 2.4 mg/dL — ABNORMAL LOW (ref 2.5–4.6)

## 2020-02-24 LAB — GLUCOSE, CAPILLARY
Glucose-Capillary: 99 mg/dL (ref 70–99)
Glucose-Capillary: 99 mg/dL (ref 70–99)
Glucose-Capillary: 115 mg/dL — ABNORMAL HIGH (ref 70–99)
Glucose-Capillary: 121 mg/dL — ABNORMAL HIGH (ref 70–99)
Glucose-Capillary: 136 mg/dL — ABNORMAL HIGH (ref 70–99)

## 2020-02-24 LAB — MAGNESIUM: Magnesium: 2 mg/dL (ref 1.7–2.4)

## 2020-02-24 MED ORDER — SODIUM CHLORIDE 0.9 % IV SOLN
2.0000 g | INTRAVENOUS | Status: DC
  Administered 2020-02-24: 2 g via INTRAVENOUS
  Filled 2020-02-24 (×2): qty 2

## 2020-02-24 MED ORDER — METOPROLOL TARTRATE 12.5 MG HALF TABLET
12.5000 mg | ORAL_TABLET | Freq: Two times a day (BID) | ORAL | Status: DC
  Administered 2020-02-24 – 2020-02-28 (×9): 12.5 mg via ORAL
  Filled 2020-02-24 (×9): qty 1

## 2020-02-24 MED ORDER — APIXABAN 5 MG PO TABS
5.0000 mg | ORAL_TABLET | Freq: Two times a day (BID) | ORAL | Status: DC
  Administered 2020-02-24 – 2020-02-28 (×9): 5 mg via ORAL
  Filled 2020-02-24 (×9): qty 1

## 2020-02-24 MED ORDER — SODIUM CHLORIDE 0.9 % IV SOLN
2.0000 g | INTRAVENOUS | Status: DC
  Administered 2020-02-24: 2 g via INTRAVENOUS
  Filled 2020-02-24: qty 20

## 2020-02-24 NOTE — Progress Notes (Deleted)
Pharmacy Antibiotic Note  Nathan [name] is a 74 y.o. male admitted on 02/22/2020 with pneumonia, bacteremia and UTI.  Pharmacy has been consulted for ceftriaxone dosing in light of culture data. This day 1 of ceftriaxone therapy. This is day 3 of adequate antimicrobial therapy, as patient was previously on cefepime. Pt has elevated WBC of 14.2 and is currently afebrile.    Plan: ceftriaxone 2g IV every 24 hours  Continue to monitor clinical course and CBC  Height: 6\' 1"  (185.4 cm) Weight: 78 kg (172 lb) IBW/kg (Calculated) : 79.9  Temp (24hrs), Avg:99.1 F (37.3 C), Min:93.2 F (34 C), Max:101.5 F (38.6 C)  Recent Labs  Lab 02/22/20 1233 02/22/20 1315 02/22/20 1555 02/22/20 1906 02/23/20 0911 02/24/20 0559  WBC  --  9.3  --  12.5* 14.2*  --   CREATININE 5.67*  --   --  4.17* 3.20* 1.84*  LATICACIDVEN >11.0*  --  9.9*  --  2.7*  --     Estimated Creatinine Clearance: 37.7 mL/min (A) (by C-G formula based on SCr of 1.84 mg/dL (H)).    No Known Allergies  Antimicrobials this admission: Ceftriaxone 8/25>> Vanc 8/23>>8/24 Cefepime 8/23>>8/25 Flagyl 8/23>>8/24  8/23 BCx: kleb pneumo (reincubated) 8/23 Bcx: >100K GNR, kleb pneumo, kleb aero  8/23 UCx: >100K GNR, kleb pneumo, kleb aero 8/24 MRSA: neg  8/23 COVID: negative   Thank you for allowing pharmacy to be a part of this patient's care.  Carolin Guernsey  PGY1 Pharmacy Resident 02/24/2020 9:43 AM

## 2020-02-24 NOTE — Progress Notes (Signed)
Noted pt has been rescheduled with change of location for his surgery 03-03-2020.  Case has been moved from main [facility] to [facility].  Currently pt is inpatient at [facility] ICU dx'd with sepsis with positive blood culture and pneumonia.  Reviewed chart with anesthesia, Konrad Felix PA, stated pt due to his admission dx he will need to be rescheduled for this procedure and have pcp follow up after discharge to determine when he can reschedule.  Called and spoke w/ Abigail Butts, triage nurse at Dr Rich Number office, and informed her of this and stated she will let Dr Kieth Brightly aware.

## 2020-02-24 NOTE — Progress Notes (Signed)
PULMONARY / CRITICAL CARE MEDICINE   NAME:  Nathan [name], MRN:  [id number], DOB:  [birth year], LOS: 2 ADMISSION DATE:  02/22/2020, CONSULTATION DATE:  02/22/20 REFERRING MD:    Fredia Sorrow, MD, CHIEF COMPLAINT:  Altered mental status  BRIEF HISTORY:    74 year old male with  hypertension, diabetes, A. fib on anticoagulation which is on hold for elective lipoma resection and recent diagnosis of prostate cancer on radiation presented with altered mental status, noted to be in septic shock due to UTI and pneumonia. HISTORY OF PRESENT ILLNESS   74 year old male who was brought in the emergency department with altered mental status.  As per patient's daughter he was diagnosed with prostate cancer recently and has been on radiation therapy, he went to see urologist last week for regular checkup and was noted to have foul-smelling urine, urinary analysis was done, it came back negative, until yesterday he was doing well, in the evening patient started getting weaker, with poor appetite and this morning he started vomiting and diarrhea and was found to be completely confused so daughter called EMS and he was brought in the emergency department.  Of note patient was seen at the urology clinic today again for change of chronic indwelling Foley catheter. PAST MEDICAL HISTORY   Diabetes Hypertension A. fib on Eliquis Left anterior chest lipoma Prostate cancer on radiation therapy  SIGNIFICANT EVENTS:  Admitted to PCCM A-fib w/ RVR STUDIES:   CT chest/abdomen noncontrast: Left upper lobe and left lower lobe airspace opacities concerning for pneumonia. Moderate bilateral hydronephrosis and hydroureter. Foley catheter is in place with bladder decompressed, but bladder wall appears thickened, possibly related to prostate enlargement and bladder outlet obstruction.  Masslike soft tissue density projects into the bladder, possibly related to the enlarged prostate, but difficult to exclude bladder wall  mass.  Probable 5 mm bladder stone. 3 mm calcification projects near the right UVJ which could be at the UVJ or just into the bladder.  Left colonic diverticulosis. Concern for inflammatory strandingadjacent to the mid descending colon concerning for active diverticulitis.  Bilateral renal low-density lesions, likely cysts. CULTURES:  8/23 Covid negative 8/23 urine culture >> GNRs >100,00 8/23 blood culture >> Klebsiella pneumoniae 8/25  ANTIBIOTICS:  Vancomycin 8/23 >> 8/24 Cefepime 8/23 >> Flagyl 8/23  LINES/TUBES:  2 PIVs Indwelling Foley CONSULTANTS:  n/a SUBJECTIVE:  No acute events overnight Patients daughter notes good improvement in the patient's mentation  CONSTITUTIONAL: BP 125/86    Pulse 98    Temp 98.2 F (36.8 C) (Oral)    Resp (!) 23    Ht 6\' 1"  (1.854 m)    Wt 78 kg    SpO2 94%    BMI 22.69 kg/m   I/O last 3 completed shifts: In: 4305.1 [P.O.:360; I.V.:3845; IV Piggyback:100.1] Out: 2545 [Urine:2545]      PHYSICAL EXAM: General: NAD, chronically-ill appearing Neuro: A&Ox3 HEENT: no scleral icterus, EOM intact Cardiovascular: Irregular rate, no murmurs appreciated Lungs: CTAB Abdomen: soft, mild suprapubic tenderness GU: Indwelling catheter intact, no erythema or discharge Skin: No rashes, large soft tissue mass at the L anterior chest  RESOLVED PROBLEM LIST  -AKI, post-renal obstructive -Acute hypoxic respiratory failure  ASSESSMENT AND PLAN   Septic shock due to complicated UTI (indwelling Foley catheter) and left upper and lower lobe pneumonia possible aspiration Clinically greatly improved with resolution of AMS. Lactate trended down, vitals are stable, now on RA, and he is off pressors. Blood cultures grew Klebsiella pneumoniae. MRSA screen negative, vanc discontinued. We  will repeat blood cultures and continue cefepime until susceptibilities are available. - Transfer to Med-Surg, no longer needs ICU level care - Continue cefepime  A-fib.  with RVR Patient has chronic a-fib and is on Eliquis at home. This is being held at the time due to an upcoming surgery. We will restart his metoprolol now that he is normotensive but will continue to hold lisinopril. - Restart metoprolol - Defer to surgery for Eliquis restart date - Replete electrolytes as needed  Possible Diverticulitis - Continue cefepime  HTN - Continuing to hold lisinopril as noted above  T2DM - SSI  SUMMARY OF TODAY'S PLAN:  - Transfer to [place] metoprolol Best Practice / Goals of Care / Disposition.   DVT prophylaxis: Subcu heparin GI prophylaxis: Famotidine Diet: Carb modified Glucose control: Sliding scale insulin Mobility: Bedrest now Code Status: Full Family Communication: Patient daughter was updated at bedside Disposition: ICU  LABS  Glucose Recent Labs  Lab 02/23/20 1147 02/23/20 1659 02/23/20 2157 02/24/20 0123 02/24/20 0738 02/24/20 1114  GLUCAP 99 137* 123* 99 99 115*    BMET Recent Labs  Lab 02/22/20 1233 02/22/20 1233 02/22/20 1906 02/23/20 0911 02/24/20 0559  NA 141  --   --  141 142  K 3.9  --   --  4.3 4.0  CL 102  --   --  106 107  CO2 16*  --   --  24 23  BUN 61*  --   --  65* 56*  CREATININE 5.67*   < > 4.17* 3.20* 1.84*  GLUCOSE 127*  --   --  124* 102*   < > = values in this interval not displayed.    Liver Enzymes Recent Labs  Lab 02/22/20 1233  AST 44*  ALT 14  ALKPHOS 75  BILITOT 0.9  ALBUMIN 2.8*    Electrolytes Recent Labs  Lab 02/22/20 1233 02/22/20 1715 02/23/20 0911 02/24/20 0559  CALCIUM 9.1  --  7.8* 8.1*  MG  --  1.2*  --  2.0  PHOS  --  2.2*  --  2.4*    CBC Recent Labs  Lab 02/22/20 1906 02/23/20 0911 02/24/20 0559  WBC 12.5* 14.2* 13.4*  HGB 9.2* 10.4* 9.8*  HCT 29.7* 32.9* 30.2*  PLT PLATELET CLUMPS NOTED ON SMEAR, UNABLE TO ESTIMATE 101* 99*    ABG No results for input(s): PHART, PCO2ART, PO2ART in the last 168 hours.  Coag's Recent Labs  Lab  02/22/20 1233 02/23/20 0911  APTT 38*  --   INR 1.8* 1.9*    Sepsis Markers Recent Labs  Lab 02/22/20 1233 02/22/20 1555 02/23/20 0911  LATICACIDVEN >11.0* 9.9* 2.7*  PROCALCITON  --   --  >150.00    Cardiac Enzymes No results for input(s): TROPONINI, PROBNP in the last 168 hours.  PAST MEDICAL HISTORY :   He  has a past medical history of Atrial fibrillation ([place]), Cancer of prostate ([place]), Chronic anticoagulation, Foley catheter in place, HTN (hypertension), Lipoma of chest wall, and WPW (Wolff-Parkinson-White syndrome).  PAST SURGICAL HISTORY:  He  has a past surgical history that includes Cataract extraction.  No Known Allergies  No current facility-administered medications on file prior to encounter.   Current Outpatient Medications on File Prior to Encounter  Medication Sig   folic acid (FOLVITE) 1 MG tablet Take 1 tablet by mouth daily.   lisinopril (ZESTRIL) 20 MG tablet Take 20 mg by mouth daily.   magnesium oxide (MAG-OX) 400 MG tablet Take  400 mg by mouth daily.    metoprolol succinate (TOPROL XL) 25 MG 24 hr tablet Take 1 tablet (25 mg total) by mouth daily.   Prenatal Vit-Fe Fumarate-FA (PRENATAL MULTIVITAMIN) TABS tablet Take 1 tablet by mouth daily at 12 noon.   vitamin B-12 (CYANOCOBALAMIN) 1000 MCG tablet Take 1,000 mcg by mouth daily.   Vitamin D, Ergocalciferol, (DRISDOL) 1.25 MG (50000 UNIT) CAPS capsule Take 50,000 Units by mouth every Sunday.    apixaban (ELIQUIS) 5 MG TABS tablet Take 1 tablet (5 mg total) by mouth 2 (two) times daily. (Patient not taking: Reported on 02/16/2020)    FAMILY HISTORY:   His family history includes Cervical cancer in his sister; Lung cancer in his mother. There is no history of Breast cancer, Pancreatic cancer, or Colon cancer.  SOCIAL HISTORY:  He  reports that he quit smoking about 12 months ago. His smoking use included cigarettes. He has a 30.00 pack-year smoking history. He has quit using smokeless tobacco.  He reports previous alcohol use. He reports that he does not use drugs.  REVIEW OF SYSTEMS:    Per HPI, ROS otherwise negative  Dortha Kern, MS4

## 2020-02-25 LAB — CBC
HCT: 28.2 % — ABNORMAL LOW (ref 39.0–52.0)
Hemoglobin: 9.3 g/dL — ABNORMAL LOW (ref 13.0–17.0)
MCH: 28 pg (ref 26.0–34.0)
MCHC: 33 g/dL (ref 30.0–36.0)
MCV: 84.9 fL (ref 80.0–100.0)
Platelets: 103 10*3/uL — ABNORMAL LOW (ref 150–400)
RBC: 3.32 MIL/uL — ABNORMAL LOW (ref 4.22–5.81)
RDW: 13.7 % (ref 11.5–15.5)
WBC: 12 10*3/uL — ABNORMAL HIGH (ref 4.0–10.5)
nRBC: 0.2 % (ref 0.0–0.2)

## 2020-02-25 LAB — GLUCOSE, CAPILLARY
Glucose-Capillary: 86 mg/dL (ref 70–99)
Glucose-Capillary: 84 mg/dL (ref 70–99)
Glucose-Capillary: 100 mg/dL — ABNORMAL HIGH (ref 70–99)
Glucose-Capillary: 108 mg/dL — ABNORMAL HIGH (ref 70–99)

## 2020-02-25 LAB — BASIC METABOLIC PANEL
Anion gap: 7 (ref 5–15)
BUN: 33 mg/dL — ABNORMAL HIGH (ref 8–23)
CO2: 26 mmol/L (ref 22–32)
Calcium: 8.1 mg/dL — ABNORMAL LOW (ref 8.9–10.3)
Chloride: 108 mmol/L (ref 98–111)
Creatinine, Ser: 1.28 mg/dL — ABNORMAL HIGH (ref 0.61–1.24)
GFR calc Af Amer: >60 mL/min (ref >60)
GFR calc non Af Amer: 54 mL/min — ABNORMAL LOW (ref >60)
Glucose, Bld: 103 mg/dL — ABNORMAL HIGH (ref 70–99)
Potassium: 3.6 mmol/L (ref 3.5–5.1)
Sodium: 141 mmol/L (ref 135–145)

## 2020-02-25 LAB — URINE CULTURE: Culture: >=100000 — AB

## 2020-02-25 MED ORDER — CEFTRIAXONE SODIUM IN DEXTROSE 40 MG/ML IV SOLN
2.0000 g | INTRAVENOUS | Status: DC
  Filled 2020-02-25: qty 50

## 2020-02-25 MED ORDER — SODIUM CHLORIDE 0.9 % IV SOLN
2.0000 g | Freq: Two times a day (BID) | INTRAVENOUS | Status: DC
  Administered 2020-02-25 – 2020-02-27 (×5): 2 g via INTRAVENOUS
  Filled 2020-02-25 (×7): qty 2

## 2020-02-25 MED ORDER — SODIUM CHLORIDE 0.9 % IV SOLN
2.0000 g | Freq: Two times a day (BID) | INTRAVENOUS | Status: DC
  Filled 2020-02-25 (×2): qty 2

## 2020-02-25 MED ORDER — SULFAMETHOXAZOLE-TRIMETHOPRIM 800-160 MG PO TABS
1.0000 | ORAL_TABLET | Freq: Two times a day (BID) | ORAL | Status: DC
  Administered 2020-02-25: 1 via ORAL
  Filled 2020-02-25: qty 1

## 2020-02-25 NOTE — Progress Notes (Addendum)
PROGRESS NOTE    Nathan [name]  OEU:[id number] DOB: 09/09/1945 DOA: 02/22/2020 PCP: [name], MD     Brief Narrative:  Nathan [name] is a 74 year old male with past medical history significant for A. fib on Eliquis, prostate cancer on radiation therapy, hypertension, diabetes, who was brought to the emergency department with chief complaint of altered mental status.  Per family member, patient had been recently diagnosed with prostate cancer and started on radiation therapy.  He went to see his urologist for regular checkup, noted to have foul-smelling urine, and urinalysis turned out to be negative.  He was doing well, but started to get weaker with poor appetite.  Morning of admission, he started vomiting with diarrhea and was found to be confused.  He was brought to the emergency department via EMS.  In the emergency department, he was found to be in A. fib RVR as well as septic shock.  Blood cultures and urine culture returned with Klebsiella.  He was weaned off pressor and transferred to hospitalist service on 8/26.  New events last 24 hours / Subjective: Feeling well, no new complaints today.  Has not been out of bed at all.  Has remained afebrile  Assessment & Plan:   Active Problems:   Septic shock (HCC)  Septic shock secondary to Klebsiella bacteremia as well as complicated UTI, related to chronic indwelling Foley catheter, present on admission -Off pressors -Awaiting blood culture sensitivity.  De-escalate antibiotics from cefepime to bactrim  -Leukocytosis improving -Repeat blood cultures pending  A. fib with RVR -Eliquis -Continue Lopressor -Rate well controlled  AKI  -Baseline creatinine 1 -Improving, continue to monitor BMP  Essential hypertension -Continue Lopressor  Type 2 diabetes -Sliding scale insulin   DVT prophylaxis:  SCDs Start: 02/22/20 1819 apixaban (ELIQUIS) tablet 5 mg  Code Status: Full code Family Communication: No family at  bedside Disposition Plan:   Status is: Inpatient  Remains inpatient appropriate because:IV treatments appropriate due to intensity of illness or inability to take PO   Dispo: The patient is from: Home              Anticipated d/c is to: To be determined, PT OT consulted              Anticipated d/c date is: 1 day              Patient currently is not medically stable to d/c.  Awaiting blood culture sensitivity for oral antibiotic recommendation prior to discharge home.  PT OT consulted    Consultants:   PCCM admission   Antimicrobials:  Anti-infectives (From admission, onward)   Start     Dose/Rate Route Frequency Ordered Stop   02/25/20 1315  cefTRIAXone (ROCEPHIN) 1 g in sodium chloride 0.9 % 100 mL IVPB        1 g 200 mL/hr over 30 Minutes Intravenous Every 24 hours 02/25/20 1307     02/25/20 1215  ceFEPIme (MAXIPIME) 2 g in sodium chloride 0.9 % 100 mL IVPB  Status:  Discontinued        2 g 200 mL/hr over 30 Minutes Intravenous Every 12 hours 02/25/20 1200 02/25/20 1307   02/24/20 1400  ceFEPIme (MAXIPIME) 2 g in sodium chloride 0.9 % 100 mL IVPB  Status:  Discontinued        2 g 200 mL/hr over 30 Minutes Intravenous Every 24 hours 02/24/20 1006 02/25/20 1200   02/24/20 1030  cefTRIAXone (ROCEPHIN) 2 g in sodium chloride 0.9 %  100 mL IVPB  Status:  Discontinued        2 g 200 mL/hr over 30 Minutes Intravenous Every 24 hours 02/24/20 0943 02/24/20 1006   02/23/20 1400  ceFEPIme (MAXIPIME) 2 g in sodium chloride 0.9 % 100 mL IVPB  Status:  Discontinued        2 g 200 mL/hr over 30 Minutes Intravenous Every 24 hours 02/22/20 1406 02/24/20 0943   02/22/20 1830  ceFEPIme (MAXIPIME) 2 g in sodium chloride 0.9 % 100 mL IVPB  Status:  Discontinued        2 g 200 mL/hr over 30 Minutes Intravenous  Once 02/22/20 1822 02/22/20 1833   02/22/20 1405  vancomycin variable dose per unstable renal function (pharmacist dosing)  Status:  Discontinued         Does not apply See admin  instructions 02/22/20 1405 02/23/20 0745   02/22/20 1315  vancomycin (VANCOREADY) IVPB 1500 mg/300 mL        1,500 mg 150 mL/hr over 120 Minutes Intravenous  Once 02/22/20 1306 02/22/20 1639   02/22/20 1245  ceFEPIme (MAXIPIME) 2 g in sodium chloride 0.9 % 100 mL IVPB        2 g 200 mL/hr over 30 Minutes Intravenous  Once 02/22/20 1230 02/22/20 1405   02/22/20 1245  metroNIDAZOLE (FLAGYL) IVPB 500 mg        500 mg 100 mL/hr over 60 Minutes Intravenous  Once 02/22/20 1230 02/22/20 1444   02/22/20 1245  vancomycin (VANCOCIN) IVPB 1000 mg/200 mL premix  Status:  Discontinued        1,000 mg 200 mL/hr over 60 Minutes Intravenous  Once 02/22/20 1230 02/22/20 1306        Objective: Vitals:   02/24/20 2239 02/25/20 0237 02/25/20 0452 02/25/20 0812  BP: (!) 128/93 105/72 107/67 114/71  Pulse: 91 86 87 91  Resp: 18 18 16 18   Temp: 99.3 F (37.4 C) 98 F (36.7 C) 98.7 F (37.1 C) 99.5 F (37.5 C)  TempSrc: Oral Oral Oral Oral  SpO2: 96% 95% 96% 93%  Weight:      Height:        Intake/Output Summary (Last 24 hours) at 02/25/2020 1307 Last data filed at 02/24/2020 2300 Gross per 24 hour  Intake 419.87 ml  Output 1050 ml  Net -630.13 ml   Filed Weights   02/22/20 1300 02/22/20 1717  Weight: 78 kg 78 kg    Examination:  General exam: Appears calm and comfortable  Respiratory system: Clear to auscultation. Respiratory effort normal. No respiratory distress. No conversational dyspnea.  Cardiovascular system: S1 & S2 heard. No murmurs. No pedal edema. Gastrointestinal system: Abdomen is nondistended, soft and nontender. Normal bowel sounds heard. Central nervous system: Alert and oriented. No focal neurological deficits. Speech clear.  Extremities: Symmetric in appearance  Skin: No rashes, lesions or ulcers on exposed skin  Psychiatry: Judgement and insight appear normal. Mood & affect appropriate.   Data Reviewed: I have personally reviewed following labs and imaging  studies  CBC: Recent Labs  Lab 02/22/20 1315 02/22/20 1906 02/23/20 0911 02/24/20 0559 02/25/20 0757  WBC 9.3 12.5* 14.2* 13.4* 12.0*  NEUTROABS 8.5*  --   --   --   --   HGB 11.9* 9.2* 10.4* 9.8* 9.3*  HCT 39.4 29.7* 32.9* 30.2* 28.2*  MCV 90.0 88.7 87.7 85.6 84.9  PLT 168 PLATELET CLUMPS NOTED ON SMEAR, UNABLE TO ESTIMATE 101* 99* 962*   Basic Metabolic Panel: Recent Labs  Lab 02/22/20 1233 02/22/20 1715 02/22/20 1906 02/23/20 0911 02/24/20 0559 02/25/20 0757  NA 141  --   --  141 142 141  K 3.9  --   --  4.3 4.0 3.6  CL 102  --   --  106 107 108  CO2 16*  --   --  24 23 26   GLUCOSE 127*  --   --  124* 102* 103*  BUN 61*  --   --  65* 56* 33*  CREATININE 5.67*  --  4.17* 3.20* 1.84* 1.28*  CALCIUM 9.1  --   --  7.8* 8.1* 8.1*  MG  --  1.2*  --   --  2.0  --   PHOS  --  2.2*  --   --  2.4*  --    GFR: Estimated Creatinine Clearance: 54.2 mL/min (A) (by C-G formula based on SCr of 1.28 mg/dL (H)). Liver Function Tests: Recent Labs  Lab 02/22/20 1233  AST 44*  ALT 14  ALKPHOS 75  BILITOT 0.9  PROT 6.2*  ALBUMIN 2.8*   No results for input(s): LIPASE, AMYLASE in the last 168 hours. No results for input(s): AMMONIA in the last 168 hours. Coagulation Profile: Recent Labs  Lab 02/22/20 1233 02/23/20 0911  INR 1.8* 1.9*   Cardiac Enzymes: Recent Labs  Lab 02/22/20 1715  CKTOTAL 336   BNP (last 3 results) No results for input(s): PROBNP in the last 8760 hours. HbA1C: Recent Labs    02/22/20 1906  HGBA1C 5.7*   CBG: Recent Labs  Lab 02/24/20 1114 02/24/20 1653 02/24/20 1931 02/25/20 0828 02/25/20 1124  GLUCAP 115* 121* 136* 86 84   Lipid Profile: No results for input(s): CHOL, HDL, LDLCALC, TRIG, CHOLHDL, LDLDIRECT in the last 72 hours. Thyroid Function Tests: No results for input(s): TSH, T4TOTAL, FREET4, T3FREE, THYROIDAB in the last 72 hours. Anemia Panel: No results for input(s): VITAMINB12, FOLATE, FERRITIN, TIBC, IRON, RETICCTPCT  in the last 72 hours. Sepsis Labs: Recent Labs  Lab 02/22/20 1233 02/22/20 1555 02/23/20 0911  PROCALCITON  --   --  >150.00  LATICACIDVEN >11.0* 9.9* 2.7*    Recent Results (from the past 240 hour(s))  Blood Culture (routine x 2)     Status: Abnormal (Preliminary result)   Collection Time: 02/22/20 12:33 PM   Specimen: BLOOD RIGHT FOREARM  Result Value Ref Range Status   Specimen Description BLOOD RIGHT FOREARM  Final   Special Requests   Final    BOTTLES DRAWN AEROBIC AND ANAEROBIC Blood Culture results may not be optimal due to an inadequate volume of blood received in culture bottles   Culture  Setup Time   Final    GRAM NEGATIVE RODS IN BOTH AEROBIC AND ANAEROBIC BOTTLES Organism ID to follow CRITICAL RESULT CALLED TO, READ BACK BY AND VERIFIED WITH: PHRMD C AMEND @0620  02/23/20 BY S GEZAHEGN    Culture (A)  Final    KLEBSIELLA PNEUMONIAE CULTURE REINCUBATED FOR BETTER GROWTH Performed at [facility], [place] [address], [place], [place] [address]    Report Status PENDING  Incomplete  Urine culture     Status: Abnormal   Collection Time: 02/22/20 12:33 PM   Specimen: In/Out Cath Urine  Result Value Ref Range Status   Specimen Description IN/OUT CATH URINE  Final   Special Requests   Final    NONE Performed at [facility], [place] [address] [place], [place] [address]    Culture (A)  Final    >=  100,000 COLONIES/mL KLEBSIELLA PNEUMONIAE 70,000 COLONIES/mL ENTEROBACTER AEROGENES    Report Status 02/25/2020 FINAL  Final   Organism ID, Bacteria KLEBSIELLA PNEUMONIAE (A)  Final   Organism ID, Bacteria ENTEROBACTER AEROGENES (A)  Final      Susceptibility   Enterobacter aerogenes - MIC*    CEFAZOLIN >=64 RESISTANT Resistant     CEFTRIAXONE <=0.25 SENSITIVE Sensitive     CIPROFLOXACIN <=0.25 SENSITIVE Sensitive     GENTAMICIN <=1 SENSITIVE Sensitive     IMIPENEM 2 SENSITIVE Sensitive     NITROFURANTOIN 64 INTERMEDIATE Intermediate     TRIMETH/SULFA <=20  SENSITIVE Sensitive     PIP/TAZO <=4 SENSITIVE Sensitive     * 70,000 COLONIES/mL ENTEROBACTER AEROGENES   Klebsiella pneumoniae - MIC*    AMPICILLIN RESISTANT Resistant     CEFAZOLIN <=4 SENSITIVE Sensitive     CEFTRIAXONE <=0.25 SENSITIVE Sensitive     CIPROFLOXACIN <=0.25 SENSITIVE Sensitive     GENTAMICIN <=1 SENSITIVE Sensitive     IMIPENEM <=0.25 SENSITIVE Sensitive     NITROFURANTOIN <=16 SENSITIVE Sensitive     TRIMETH/SULFA <=20 SENSITIVE Sensitive     AMPICILLIN/SULBACTAM 4 SENSITIVE Sensitive     PIP/TAZO <=4 SENSITIVE Sensitive     * >=100,000 COLONIES/mL KLEBSIELLA PNEUMONIAE  Blood Culture ID Panel (Reflexed)     Status: Abnormal   Collection Time: 02/22/20 12:33 PM  Result Value Ref Range Status   Enterococcus faecalis NOT DETECTED NOT DETECTED Final   Enterococcus Faecium NOT DETECTED NOT DETECTED Final   Listeria monocytogenes NOT DETECTED NOT DETECTED Final   Staphylococcus species NOT DETECTED NOT DETECTED Final   Staphylococcus aureus (BCID) NOT DETECTED NOT DETECTED Final   Staphylococcus epidermidis NOT DETECTED NOT DETECTED Final   Staphylococcus lugdunensis NOT DETECTED NOT DETECTED Final   Streptococcus species NOT DETECTED NOT DETECTED Final   Streptococcus agalactiae NOT DETECTED NOT DETECTED Final   Streptococcus pneumoniae NOT DETECTED NOT DETECTED Final   Streptococcus pyogenes NOT DETECTED NOT DETECTED Final   A.calcoaceticus-baumannii NOT DETECTED NOT DETECTED Final   Bacteroides fragilis NOT DETECTED NOT DETECTED Final   Enterobacterales DETECTED (A) NOT DETECTED Final    Comment: CRITICAL RESULT CALLED TO, READ BACK BY AND VERIFIED WITH: PHRMD C AMEND @0620  02/23/20 BY S GEZAHEGN    Enterobacter cloacae complex NOT DETECTED NOT DETECTED Final   Escherichia coli NOT DETECTED NOT DETECTED Final   Klebsiella aerogenes DETECTED (A) NOT DETECTED Final    Comment: CRITICAL RESULT CALLED TO, READ BACK BY AND VERIFIED WITH: PHRMD C AMEND @0620  02/23/20  BY S GEZAHEGN    Klebsiella oxytoca NOT DETECTED NOT DETECTED Final   Klebsiella pneumoniae DETECTED (A) NOT DETECTED Final    Comment: CRITICAL RESULT CALLED TO, READ BACK BY AND VERIFIED WITH: PHRMD C AMEND @0620  02/23/20 BY S GEZAHEGN    Proteus species NOT DETECTED NOT DETECTED Final   Salmonella species NOT DETECTED NOT DETECTED Final   Serratia marcescens NOT DETECTED NOT DETECTED Final   Haemophilus influenzae NOT DETECTED NOT DETECTED Final   Neisseria meningitidis NOT DETECTED NOT DETECTED Final   Pseudomonas aeruginosa NOT DETECTED NOT DETECTED Final   Stenotrophomonas maltophilia NOT DETECTED NOT DETECTED Final   Candida albicans NOT DETECTED NOT DETECTED Final   Candida auris NOT DETECTED NOT DETECTED Final   Candida glabrata NOT DETECTED NOT DETECTED Final   Candida krusei NOT DETECTED NOT DETECTED Final   Candida parapsilosis NOT DETECTED NOT DETECTED Final   Candida tropicalis NOT DETECTED NOT DETECTED Final  Cryptococcus neoformans/gattii NOT DETECTED NOT DETECTED Final   CTX-M ESBL NOT DETECTED NOT DETECTED Final   Carbapenem resistance IMP NOT DETECTED NOT DETECTED Final   Carbapenem resistance KPC NOT DETECTED NOT DETECTED Final   Carbapenem resistance NDM NOT DETECTED NOT DETECTED Final   Carbapenem resist OXA 48 LIKE NOT DETECTED NOT DETECTED Final   Carbapenem resistance VIM NOT DETECTED NOT DETECTED Final    Comment: Performed at [facility], [place] [address], [place], [place] [address]  SARS Coronavirus 2 by RT PCR (hospital order, performed in [facility] hospital lab) Nasopharyngeal Nasopharyngeal Swab     Status: None   Collection Time: 02/22/20 12:35 PM   Specimen: Nasopharyngeal Swab  Result Value Ref Range Status   SARS Coronavirus 2 NEGATIVE NEGATIVE Final    Comment: (NOTE) SARS-CoV-2 target nucleic acids are NOT DETECTED.  The SARS-CoV-2 RNA is generally detectable in upper and lower respiratory specimens during the acute phase of infection.  The lowest concentration of SARS-CoV-2 viral copies this assay can detect is 250 copies / mL. A negative result does not preclude SARS-CoV-2 infection and should not be used as the sole basis for treatment or other patient management decisions.  A negative result may occur with improper specimen collection / handling, submission of specimen other than nasopharyngeal swab, presence of viral mutation(s) within the areas targeted by this assay, and inadequate number of viral copies (<250 copies / mL). A negative result must be combined with clinical observations, patient history, and epidemiological information.  Fact Sheet for Patients:   StrictlyIdeas.no  Fact Sheet for Healthcare Providers: BankingDealers.co.za  This test is not yet approved or  cleared by the [place] FDA and has been authorized for detection and/or diagnosis of SARS-CoV-2 by FDA under an Emergency Use Authorization (EUA).  This EUA will remain in effect (meaning this test can be used) for the duration of the COVID-19 declaration under Section 564(b)(1) of the Act, 21 U.S.C. section 360bbb-3(b)(1), unless the authorization is terminated or revoked sooner.  Performed at [facility], [place] [address] [place], [place] [address]   Blood Culture (routine x 2)     Status: None (Preliminary result)   Collection Time: 02/22/20  1:16 PM   Specimen: BLOOD  Result Value Ref Range Status   Specimen Description BLOOD SITE NOT SPECIFIED  Final   Special Requests   Final    BOTTLES DRAWN AEROBIC AND ANAEROBIC Blood Culture adequate volume   Culture  Setup Time   Final    GRAM NEGATIVE RODS IN BOTH AEROBIC AND ANAEROBIC BOTTLES CRITICAL VALUE NOTED.  VALUE IS CONSISTENT WITH PREVIOUSLY REPORTED AND CALLED VALUE.    Culture   Final    CULTURE REINCUBATED FOR BETTER GROWTH KLEBSIELLA PNEUMONIAE SUSCEPTIBILITIES TO FOLLOW Performed at [facility], [place]  [address], [place], [place] [address]    Report Status PENDING  Incomplete  MRSA PCR Screening     Status: None   Collection Time: 02/23/20 11:40 AM   Specimen: Nasal Mucosa; Nasopharyngeal  Result Value Ref Range Status   MRSA by PCR NEGATIVE NEGATIVE Final    Comment:        The GeneXpert MRSA Assay (FDA approved for NASAL specimens only), is one component of a comprehensive MRSA colonization surveillance program. It is not intended to diagnose MRSA infection nor to guide or monitor treatment for MRSA infections. Performed at [facility], [place] [address] [place], [place] [address]   Culture,  blood (Routine X 2) w Reflex to ID Panel     Status: None (Preliminary result)   Collection Time: 02/24/20 11:27 AM   Specimen: BLOOD LEFT ARM  Result Value Ref Range Status   Specimen Description BLOOD LEFT ARM  Final   Special Requests   Final    BOTTLES DRAWN AEROBIC AND ANAEROBIC Blood Culture adequate volume   Culture   Final    NO GROWTH < 24 HOURS Performed at [facility], [address] [place], [place] [address]    Report Status PENDING  Incomplete  Culture, blood (Routine X 2) w Reflex to ID Panel     Status: None (Preliminary result)   Collection Time: 02/24/20 11:29 AM   Specimen: BLOOD RIGHT HAND  Result Value Ref Range Status   Specimen Description BLOOD RIGHT HAND  Final   Special Requests   Final    BOTTLES DRAWN AEROBIC ONLY Blood Culture results may not be optimal due to an inadequate volume of blood received in culture bottles   Culture   Final    NO GROWTH < 24 HOURS Performed at [facility], [place] [address], [place],  [address]    Report Status PENDING  Incomplete      Radiology Studies: DG Chest Port 1 View  Result Date: 02/24/2020 CLINICAL DATA:  Respiratory difficulty EXAM: PORTABLE CHEST 1 VIEW COMPARISON:  February 22, 2020 chest radiograph and chest CT FINDINGS: There is patchy airspace opacity at several sites in the left lung, most  notably in the left lower lobe, similar to recent study. Right lung is clear by radiography. Heart size and pulmonary vascularity are normal. No adenopathy. No bone lesions. IMPRESSION: Persistent patchy airspace opacity in the left, is noted within the left lower lobe region. No new opacity evident. Right lung clear. Stable cardiac silhouette. Electronically Signed   By: Lowella Grip III M.D.   On: 02/24/2020 08:03      Scheduled Meds: . apixaban  5 mg Oral BID  . Chlorhexidine Gluconate Cloth  6 each Topical Daily  . docusate sodium  100 mg Oral BID  . insulin aspart  0-15 Units Subcutaneous TID WC  . insulin aspart  0-5 Units Subcutaneous QHS  . metoprolol tartrate  12.5 mg Oral BID  . polyethylene glycol  17 g Oral Daily  . sodium chloride flush  3 mL Intravenous Q12H   Continuous Infusions: . sodium chloride    . cefTRIAXone (ROCEPHIN)  IV       LOS: 3 days      Time spent: 35 minutes   Dessa Phi, DO [facility] 02/25/2020, 1:07 PM   Available via Epic secure chat 7am-7pm After these hours, please refer to coverage provider listed on [email or web address]

## 2020-02-25 NOTE — Progress Notes (Signed)
Pharmacy Antibiotic Note  Nathan [name] is a 74 y.o. male admitted on 02/22/2020 with pneumonia, bacteremia and UTI.  Pharmacy has been consulted for cefepime dosing. This is day 4 of adequate antimicrobial therapy. Pt had elevated WBC of 14.2, down to 12 and is currently afebrile. CCM would like to keep broad coverage -8/25. SCr is trending down to 1.28 today (BL ~1)   Plan: Adjust cefepime to 2 g IV every 12 hours Monitor clinical progress, cultures/sensitivities, renal function, abx plan   Height: 6\' 1"  (185.4 cm) Weight: 78 kg (172 lb) IBW/kg (Calculated) : 79.9  Temp (24hrs), Avg:98.8 F (37.1 C), Min:98 F (36.7 C), Max:99.5 F (37.5 C)  Recent Labs  Lab 02/22/20 1233 02/22/20 1315 02/22/20 1555 02/22/20 1906 02/23/20 0911 02/24/20 0559 02/25/20 0757  WBC  --  9.3  --  12.5* 14.2* 13.4* 12.0*  CREATININE 5.67*  --   --  4.17* 3.20* 1.84* 1.28*  LATICACIDVEN >11.0*  --  9.9*  --  2.7*  --   --     Estimated Creatinine Clearance: 54.2 mL/min (A) (by C-G formula based on SCr of 1.28 mg/dL (H)).    No Known Allergies  Antimicrobials this admission: Ceftriaxone 8/25 x 1 Vanc 8/23>>8/24 Cefepime 8/23>>8/25, 8/26>> Flagyl 8/23>>8/24  8/23 BCx: kleb pneumo (reincubated) 8/23 Bcx: >100K GNR, kleb pneumo, kleb aero  8/23 UCx: >100K GNR, kleb pneumo, kleb aero 8/24 MRSA: neg  8/23 COVID: negative    Thank you for allowing [place] to participate in this patients care.   Jens Som, PharmD Please see amion for complete clinical pharmacist phone list. 02/25/2020 12:02 PM

## 2020-02-25 NOTE — Evaluation (Addendum)
I agree with the following treatment note after review of the documentation. This session was performed under the supervision of a licensed clinician.   Lou Miner, DPT  Acute Rehabilitation Services  Pager: [phone]  Physical Therapy Evaluation Patient Details Name: Nathan [name] MRN: [id number] DOB: [birth year] Today's Date: 02/25/2020   History of Present Illness  pt is a 74 yo male who presents to the ED with vomitting, diarrhea, poor appetite, AMS, and general weakness. Found to have sepsis likely from UTI and L upper and lower lobe PNA. Pt has a PMH of HTN, T2DM, a-fib, and prostate cancer. Pt has an indwelling foley catheter   Clinical Impression  Pt was evaluated and assessed for above diagnosis and impairments below. Pt required supervision for bed mobility and transfers. Pt refusing any further mobility. Pt was educated on importance of PT and the need to be seen to ensure safety with functional tasks. Pt lives at home with wife and daughter who are able to assist 24/7. Pt would continue to benefit from acute therapy in order to ensure safety with ambulation and stair navigation. Will continue to follow acutely.     Follow Up Recommendations No PT follow up (pt refusing HHPT )    Equipment Recommendations  None recommended by PT    Recommendations for Other Services       Precautions / Restrictions Precautions Precautions: Fall Restrictions Weight Bearing Restrictions: No      Mobility  Bed Mobility Overal bed mobility: Needs Assistance Bed Mobility: Supine to Sit;Sit to Supine     Supine to sit: Supervision Sit to supine: Supervision   General bed mobility comments: pt required supervision for safety with bed mobility  Transfers Overall transfer level: Needs assistance Equipment used: None Transfers: Sit to/from Stand Sit to Stand: Supervision         General transfer comment: pt required supervision for safety with sit<>stand transfer. Pt needed  increased time with power up to standing. Pt refusing further mobility.   Ambulation/Gait             General Gait Details: pt refusing to walk  Stairs            Wheelchair Mobility    Modified Rankin (Stroke Patients Only)       Balance Overall balance assessment: Needs assistance Sitting-balance support: Feet supported;No upper extremity supported Sitting balance-Leahy Scale: Good     Standing balance support: No upper extremity supported Standing balance-Leahy Scale: Fair Standing balance comment: Pt able to stand up without any external support. Pt was noted to have a very forward flexed posture                Pertinent Vitals/Pain Pain Assessment: No/denies pain    Home Living Family/patient expects to be discharged to:: Private residence Living Arrangements: Children;Spouse/significant other Available Help at Discharge: Family;Available 24 hours/day Type of Home: House Home Access: Stairs to enter Entrance Stairs-Rails: Right Entrance Stairs-Number of Steps: 3 Home Layout: Two level Home Equipment: Cane - single point;Shower seat;Grab bars - tub/shower Additional Comments: pt lives with his wife at his daughter's home. wife is Psychologist, prison and probation services     Prior Function Level of Independence: Independent         Comments: pt states he is retired and doesn't do anything he doesn't want to do. 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 pt was notably agitated upon entry. Pt stated that he wants to do what he wants and was unwilling to do any walking      General Comments General comments (skin integrity, edema, etc.): pt was educated on the need to be seen by PT in order to ensure safety with functional tasks. Also refusing to do general HEP while in bed    Exercises     Assessment/Plan    PT Assessment Patient needs continued PT services  PT Problem List Decreased strength;Decreased range of motion;Decreased balance;Decreased mobility;Decreased coordination;Decreased knowledge of use of DME       PT Treatment Interventions Gait training;Stair training;Functional mobility training;Therapeutic activities;Therapeutic exercise;Balance training    PT Goals (Current goals can be found in the Care Plan section)  Acute Rehab PT Goals Patient Stated Goal: get me home already PT Goal Formulation: With patient Time For Goal Achievement: 03/10/20 Potential to Achieve Goals: Fair    Frequency Min 3X/week   Barriers to discharge        Co-evaluation               AM-PAC PT "6 Clicks" Mobility  Outcome Measure Help needed turning from your back to your side while in a flat bed without using bedrails?: None Help needed moving from lying on your back to sitting on the side of a flat bed without using bedrails?: None Help needed moving to and from a bed to a chair (including a wheelchair)?: None Help needed standing up from a chair using your arms (e.g., wheelchair or bedside chair)?: None Help needed to walk in hospital room?: A Little Help needed climbing 3-5 steps with a railing? : A Little 6 Click Score: 22    End of Session Equipment Utilized During Treatment: Gait belt Activity Tolerance: Treatment limited secondary to agitation Patient left: in bed;with call bell/phone within reach;with bed alarm set Nurse Communication: Mobility  status PT Visit Diagnosis: Muscle weakness (generalized) (M62.81)    Time: 2458-0998 PT Time Calculation (min) (ACUTE ONLY): 18 min   Charges:   PT Evaluation $PT Eval Moderate Complexity: 1 Mod         Gloriann Loan, SPT  [place]  Office: [phone]  02/25/2020, 3:18 PM

## 2020-02-25 NOTE — Progress Notes (Signed)
RN called and gave report to RN on 6N. Pt's belongings were gathered, foley emptied and pt was tx in wheelchair. RN waited until [facility] RN was in room and pt was comfortable in the bed, in low position with call bell in reach before leaving the room. Daughter [name] was called and notified about pt's transfer.

## 2020-02-26 LAB — CULTURE, BLOOD (ROUTINE X 2): Special Requests: ADEQUATE

## 2020-02-26 LAB — CBC
HCT: 27.5 % — ABNORMAL LOW (ref 39.0–52.0)
Hemoglobin: 8.7 g/dL — ABNORMAL LOW (ref 13.0–17.0)
MCH: 26.6 pg (ref 26.0–34.0)
MCHC: 31.6 g/dL (ref 30.0–36.0)
MCV: 84.1 fL (ref 80.0–100.0)
Platelets: 117 10*3/uL — ABNORMAL LOW (ref 150–400)
RBC: 3.27 MIL/uL — ABNORMAL LOW (ref 4.22–5.81)
RDW: 13.8 % (ref 11.5–15.5)
WBC: 10.7 10*3/uL — ABNORMAL HIGH (ref 4.0–10.5)
nRBC: 0 % (ref 0.0–0.2)

## 2020-02-26 LAB — BASIC METABOLIC PANEL
Anion gap: 10 (ref 5–15)
BUN: 27 mg/dL — ABNORMAL HIGH (ref 8–23)
CO2: 25 mmol/L (ref 22–32)
Calcium: 8.1 mg/dL — ABNORMAL LOW (ref 8.9–10.3)
Chloride: 107 mmol/L (ref 98–111)
Creatinine, Ser: 1.27 mg/dL — ABNORMAL HIGH (ref 0.61–1.24)
GFR calc Af Amer: >60 mL/min (ref >60)
GFR calc non Af Amer: 55 mL/min — ABNORMAL LOW (ref >60)
Glucose, Bld: 102 mg/dL — ABNORMAL HIGH (ref 70–99)
Potassium: 3.4 mmol/L — ABNORMAL LOW (ref 3.5–5.1)
Sodium: 142 mmol/L (ref 135–145)

## 2020-02-26 LAB — GLUCOSE, CAPILLARY
Glucose-Capillary: 102 mg/dL — ABNORMAL HIGH (ref 70–99)
Glucose-Capillary: 132 mg/dL — ABNORMAL HIGH (ref 70–99)
Glucose-Capillary: 100 mg/dL — ABNORMAL HIGH (ref 70–99)
Glucose-Capillary: 99 mg/dL (ref 70–99)

## 2020-02-26 MED ORDER — POTASSIUM CHLORIDE CRYS ER 20 MEQ PO TBCR
40.0000 meq | EXTENDED_RELEASE_TABLET | Freq: Once | ORAL | Status: AC
  Administered 2020-02-26: 40 meq via ORAL
  Filled 2020-02-26: qty 2

## 2020-02-26 NOTE — Progress Notes (Signed)
PROGRESS NOTE    Nathan [name]  QVZ:[id number] DOB: 02-Sep-1945 DOA: 02/22/2020 PCP: [name], MD     Brief Narrative:  Nathan [name] is a 74 year old male with past medical history significant for A. fib on Eliquis, prostate cancer on radiation therapy, hypertension, diabetes, who was brought to the emergency department with chief complaint of altered mental status.  Per family member, patient had been recently diagnosed with prostate cancer and started on radiation therapy.  He went to see his urologist for regular checkup, noted to have foul-smelling urine, and urinalysis turned out to be negative.  He was doing well, but started to get weaker with poor appetite.  Morning of admission, he started vomiting with diarrhea and was found to be confused.  He was brought to the emergency department via EMS.  In the emergency department, he was found to be in A. fib RVR as well as septic shock.  Blood cultures and urine culture returned with Klebsiella.  He was weaned off pressor and transferred to hospitalist service on 8/26.  New events last 24 hours / Subjective: No complaints, unaware of fevers yesterday.  Wants to go home  Assessment & Plan:   Active Problems:   Septic shock (HCC)  Septic shock secondary to Klebsiella bacteremia as well as complicated UTI, related to chronic indwelling Foley catheter, present on admission -Off pressors -Leukocytosis improving  -Repeat blood cultures 8/25 negative to date  -Transition from cefepime to Bactrim yesterday, and patient subsequently spiked fever T-max 100.6.  He is now back on cefepime.  A. fib with RVR -Eliquis -Continue Lopressor -Rate well controlled  AKI  -Baseline creatinine 1 -Improving, continue to monitor BMP  Essential hypertension -Continue Lopressor  Type 2 diabetes -Sliding scale insulin  Hypokalemia -Replaced, trend   DVT prophylaxis:  SCDs Start: 02/22/20 1819 apixaban (ELIQUIS) tablet 5 mg  Code Status:  Full code Family Communication: No family at bedside Disposition Plan:   Status is: Inpatient  Remains inpatient appropriate because:IV treatments appropriate due to intensity of illness or inability to take PO   Dispo: The patient is from: Home              Anticipated d/c is to: Home              Anticipated d/c date is: 1 day              Patient currently is not medically stable to d/c.  Plan for discharge home 8/28 as long as he remains afebrile 24 hours.   Consultants:   PCCM admission   Antimicrobials:  Anti-infectives (From admission, onward)   Start     Dose/Rate Route Frequency Ordered Stop   02/25/20 1600  ceFEPIme (MAXIPIME) 2 g in sodium chloride 0.9 % 100 mL IVPB        2 g 200 mL/hr over 30 Minutes Intravenous Every 12 hours 02/25/20 1539     02/25/20 1400  cefTRIAXone (ROCEPHIN) 2 g in dextrose 5 % 50 mL IVPB - Premix  Status:  Discontinued        2 g 100 mL/hr over 30 Minutes Intravenous Every 24 hours 02/25/20 1307 02/25/20 1330   02/25/20 1345  sulfamethoxazole-trimethoprim (BACTRIM DS) 800-160 MG per tablet 1 tablet  Status:  Discontinued        1 tablet Oral Every 12 hours 02/25/20 1330 02/25/20 1513   02/25/20 1215  ceFEPIme (MAXIPIME) 2 g in sodium chloride 0.9 % 100 mL IVPB  Status:  Discontinued  2 g 200 mL/hr over 30 Minutes Intravenous Every 12 hours 02/25/20 1200 02/25/20 1307   02/24/20 1400  ceFEPIme (MAXIPIME) 2 g in sodium chloride 0.9 % 100 mL IVPB  Status:  Discontinued        2 g 200 mL/hr over 30 Minutes Intravenous Every 24 hours 02/24/20 1006 02/25/20 1200   02/24/20 1030  cefTRIAXone (ROCEPHIN) 2 g in sodium chloride 0.9 % 100 mL IVPB  Status:  Discontinued        2 g 200 mL/hr over 30 Minutes Intravenous Every 24 hours 02/24/20 0943 02/24/20 1006   02/23/20 1400  ceFEPIme (MAXIPIME) 2 g in sodium chloride 0.9 % 100 mL IVPB  Status:  Discontinued        2 g 200 mL/hr over 30 Minutes Intravenous Every 24 hours 02/22/20 1406  02/24/20 0943   02/22/20 1830  ceFEPIme (MAXIPIME) 2 g in sodium chloride 0.9 % 100 mL IVPB  Status:  Discontinued        2 g 200 mL/hr over 30 Minutes Intravenous  Once 02/22/20 1822 02/22/20 1833   02/22/20 1405  vancomycin variable dose per unstable renal function (pharmacist dosing)  Status:  Discontinued         Does not apply See admin instructions 02/22/20 1405 02/23/20 0745   02/22/20 1315  vancomycin (VANCOREADY) IVPB 1500 mg/300 mL        1,500 mg 150 mL/hr over 120 Minutes Intravenous  Once 02/22/20 1306 02/22/20 1639   02/22/20 1245  ceFEPIme (MAXIPIME) 2 g in sodium chloride 0.9 % 100 mL IVPB        2 g 200 mL/hr over 30 Minutes Intravenous  Once 02/22/20 1230 02/22/20 1405   02/22/20 1245  metroNIDAZOLE (FLAGYL) IVPB 500 mg        500 mg 100 mL/hr over 60 Minutes Intravenous  Once 02/22/20 1230 02/22/20 1444   02/22/20 1245  vancomycin (VANCOCIN) IVPB 1000 mg/200 mL premix  Status:  Discontinued        1,000 mg 200 mL/hr over 60 Minutes Intravenous  Once 02/22/20 1230 02/22/20 1306       Objective: Vitals:   02/25/20 1357 02/25/20 1509 02/25/20 2105 02/26/20 0545  BP: 120/80  112/79 131/76  Pulse: 88  94 90  Resp: 18  17 18   Temp: (!) 100.4 F (38 C) (!) 100.6 F (38.1 C) 99.9 F (37.7 C) 100.1 F (37.8 C)  TempSrc: Oral Oral Oral Oral  SpO2: 93%  95% 95%  Weight:      Height:        Intake/Output Summary (Last 24 hours) at 02/26/2020 1247 Last data filed at 02/26/2020 0400 Gross per 24 hour  Intake 0 ml  Output 650 ml  Net -650 ml   Filed Weights   02/22/20 1300 02/22/20 1717  Weight: 78 kg 78 kg    Examination: General exam: Appears calm and comfortable  Respiratory system: Clear to auscultation. Respiratory effort normal. Cardiovascular system: S1 & S2 heard, RRR. No pedal edema. Gastrointestinal system: Abdomen is nondistended, soft and nontender. Normal bowel sounds heard. Central nervous system: Alert and oriented. Non focal exam. Speech clear   Extremities: Symmetric in appearance bilaterally  Skin: No rashes, lesions or ulcers on exposed skin  Psychiatry: Judgement and insight appear stable. Mood & affect appropriate.   Data Reviewed: I have personally reviewed following labs and imaging studies  CBC: Recent Labs  Lab 02/22/20 1315 02/22/20 1315 02/22/20 1906 02/23/20 0911 02/24/20 0559 02/25/20  0757 02/26/20 0044  WBC 9.3   < > 12.5* 14.2* 13.4* 12.0* 10.7*  NEUTROABS 8.5*  --   --   --   --   --   --   HGB 11.9*   < > 9.2* 10.4* 9.8* 9.3* 8.7*  HCT 39.4   < > 29.7* 32.9* 30.2* 28.2* 27.5*  MCV 90.0   < > 88.7 87.7 85.6 84.9 84.1  PLT 168   < > PLATELET CLUMPS NOTED ON SMEAR, UNABLE TO ESTIMATE 101* 99* 103* 117*   < > = values in this interval not displayed.   Basic Metabolic Panel: Recent Labs  Lab 02/22/20 1233 02/22/20 1233 02/22/20 1715 02/22/20 1906 02/23/20 0911 02/24/20 0559 02/25/20 0757 02/26/20 0044  NA 141  --   --   --  141 142 141 142  K 3.9  --   --   --  4.3 4.0 3.6 3.4*  CL 102  --   --   --  106 107 108 107  CO2 16*  --   --   --  24 23 26 25   GLUCOSE 127*  --   --   --  124* 102* 103* 102*  BUN 61*  --   --   --  65* 56* 33* 27*  CREATININE 5.67*   < >  --  4.17* 3.20* 1.84* 1.28* 1.27*  CALCIUM 9.1  --   --   --  7.8* 8.1* 8.1* 8.1*  MG  --   --  1.2*  --   --  2.0  --   --   PHOS  --   --  2.2*  --   --  2.4*  --   --    < > = values in this interval not displayed.   GFR: Estimated Creatinine Clearance: 54.6 mL/min (A) (by C-G formula based on SCr of 1.27 mg/dL (H)). Liver Function Tests: Recent Labs  Lab 02/22/20 1233  AST 44*  ALT 14  ALKPHOS 75  BILITOT 0.9  PROT 6.2*  ALBUMIN 2.8*   No results for input(s): LIPASE, AMYLASE in the last 168 hours. No results for input(s): AMMONIA in the last 168 hours. Coagulation Profile: Recent Labs  Lab 02/22/20 1233 02/23/20 0911  INR 1.8* 1.9*   Cardiac Enzymes: Recent Labs  Lab 02/22/20 1715  CKTOTAL 336   BNP (last  3 results) No results for input(s): PROBNP in the last 8760 hours. HbA1C: No results for input(s): HGBA1C in the last 72 hours. CBG: Recent Labs  Lab 02/25/20 1124 02/25/20 1646 02/25/20 2105 02/26/20 0807 02/26/20 1211  GLUCAP 84 100* 108* 102* 132*   Lipid Profile: No results for input(s): CHOL, HDL, LDLCALC, TRIG, CHOLHDL, LDLDIRECT in the last 72 hours. Thyroid Function Tests: No results for input(s): TSH, T4TOTAL, FREET4, T3FREE, THYROIDAB in the last 72 hours. Anemia Panel: No results for input(s): VITAMINB12, FOLATE, FERRITIN, TIBC, IRON, RETICCTPCT in the last 72 hours. Sepsis Labs: Recent Labs  Lab 02/22/20 1233 02/22/20 1555 02/23/20 0911  PROCALCITON  --   --  >150.00  LATICACIDVEN >11.0* 9.9* 2.7*    Recent Results (from the past 240 hour(s))  Blood Culture (routine x 2)     Status: Abnormal   Collection Time: 02/22/20 12:33 PM   Specimen: BLOOD RIGHT FOREARM  Result Value Ref Range Status   Specimen Description BLOOD RIGHT FOREARM  Final   Special Requests   Final    BOTTLES DRAWN AEROBIC AND ANAEROBIC Blood  Culture results may not be optimal due to an inadequate volume of blood received in culture bottles   Culture  Setup Time   Final    GRAM NEGATIVE RODS IN BOTH AEROBIC AND ANAEROBIC BOTTLES Organism ID to follow CRITICAL RESULT CALLED TO, READ BACK BY AND VERIFIED WITH: PHRMD C AMEND @0620  02/23/20 BY S GEZAHEGN Performed at [facility], [address] [place], [place] [address]    Culture KLEBSIELLA PNEUMONIAE ENTEROBACTER AEROGENES  (A)  Final   Report Status 02/26/2020 FINAL  Final   Organism ID, Bacteria KLEBSIELLA PNEUMONIAE  Final   Organism ID, Bacteria ENTEROBACTER AEROGENES  Final      Susceptibility   Enterobacter aerogenes - MIC*    CEFAZOLIN >=64 RESISTANT Resistant     CEFEPIME <=0.12 SENSITIVE Sensitive     CEFTAZIDIME <=1 SENSITIVE Sensitive     CEFTRIAXONE <=0.25 SENSITIVE Sensitive     CIPROFLOXACIN <=0.25 SENSITIVE  Sensitive     GENTAMICIN <=1 SENSITIVE Sensitive     IMIPENEM 1 SENSITIVE Sensitive     TRIMETH/SULFA <=20 SENSITIVE Sensitive     PIP/TAZO <=4 SENSITIVE Sensitive     * ENTEROBACTER AEROGENES   Klebsiella pneumoniae - MIC*    AMPICILLIN RESISTANT Resistant     CEFAZOLIN <=4 SENSITIVE Sensitive     CEFEPIME <=0.12 SENSITIVE Sensitive     CEFTAZIDIME <=1 SENSITIVE Sensitive     CEFTRIAXONE <=0.25 SENSITIVE Sensitive     CIPROFLOXACIN <=0.25 SENSITIVE Sensitive     GENTAMICIN <=1 SENSITIVE Sensitive     IMIPENEM <=0.25 SENSITIVE Sensitive     TRIMETH/SULFA <=20 SENSITIVE Sensitive     AMPICILLIN/SULBACTAM 4 SENSITIVE Sensitive     PIP/TAZO <=4 SENSITIVE Sensitive     * KLEBSIELLA PNEUMONIAE  Urine culture     Status: Abnormal   Collection Time: 02/22/20 12:33 PM   Specimen: In/Out Cath Urine  Result Value Ref Range Status   Specimen Description IN/OUT CATH URINE  Final   Special Requests   Final    NONE Performed at [facility], [place] [address] [place], [place] [address]    Culture (A)  Final    >=100,000 COLONIES/mL KLEBSIELLA PNEUMONIAE 70,000 COLONIES/mL ENTEROBACTER AEROGENES    Report Status 02/25/2020 FINAL  Final   Organism ID, Bacteria KLEBSIELLA PNEUMONIAE (A)  Final   Organism ID, Bacteria ENTEROBACTER AEROGENES (A)  Final      Susceptibility   Enterobacter aerogenes - MIC*    CEFAZOLIN >=64 RESISTANT Resistant     CEFEPIME <=0.12 SENSITIVE Sensitive     CEFTRIAXONE <=0.25 SENSITIVE Sensitive     CIPROFLOXACIN <=0.25 SENSITIVE Sensitive     GENTAMICIN <=1 SENSITIVE Sensitive     IMIPENEM 2 SENSITIVE Sensitive     NITROFURANTOIN 64 INTERMEDIATE Intermediate     TRIMETH/SULFA <=20 SENSITIVE Sensitive     PIP/TAZO <=4 SENSITIVE Sensitive     * 70,000 COLONIES/mL ENTEROBACTER AEROGENES   Klebsiella pneumoniae - MIC*    AMPICILLIN RESISTANT Resistant     CEFAZOLIN <=4 SENSITIVE Sensitive     CEFTRIAXONE <=0.25 SENSITIVE Sensitive     CIPROFLOXACIN <=0.25  SENSITIVE Sensitive     GENTAMICIN <=1 SENSITIVE Sensitive     IMIPENEM <=0.25 SENSITIVE Sensitive     NITROFURANTOIN <=16 SENSITIVE Sensitive     TRIMETH/SULFA <=20 SENSITIVE Sensitive     AMPICILLIN/SULBACTAM 4 SENSITIVE Sensitive     PIP/TAZO <=4 SENSITIVE Sensitive     * >=100,000 COLONIES/mL KLEBSIELLA PNEUMONIAE  Blood Culture ID Panel (  Reflexed)     Status: Abnormal   Collection Time: 02/22/20 12:33 PM  Result Value Ref Range Status   Enterococcus faecalis NOT DETECTED NOT DETECTED Final   Enterococcus Faecium NOT DETECTED NOT DETECTED Final   Listeria monocytogenes NOT DETECTED NOT DETECTED Final   Staphylococcus species NOT DETECTED NOT DETECTED Final   Staphylococcus aureus (BCID) NOT DETECTED NOT DETECTED Final   Staphylococcus epidermidis NOT DETECTED NOT DETECTED Final   Staphylococcus lugdunensis NOT DETECTED NOT DETECTED Final   Streptococcus species NOT DETECTED NOT DETECTED Final   Streptococcus agalactiae NOT DETECTED NOT DETECTED Final   Streptococcus pneumoniae NOT DETECTED NOT DETECTED Final   Streptococcus pyogenes NOT DETECTED NOT DETECTED Final   A.calcoaceticus-baumannii NOT DETECTED NOT DETECTED Final   Bacteroides fragilis NOT DETECTED NOT DETECTED Final   Enterobacterales DETECTED (A) NOT DETECTED Final    Comment: CRITICAL RESULT CALLED TO, READ BACK BY AND VERIFIED WITH: PHRMD C AMEND @0620  02/23/20 BY S GEZAHEGN    Enterobacter cloacae complex NOT DETECTED NOT DETECTED Final   Escherichia coli NOT DETECTED NOT DETECTED Final   Klebsiella aerogenes DETECTED (A) NOT DETECTED Final    Comment: CRITICAL RESULT CALLED TO, READ BACK BY AND VERIFIED WITH: PHRMD C AMEND @0620  02/23/20 BY S GEZAHEGN    Klebsiella oxytoca NOT DETECTED NOT DETECTED Final   Klebsiella pneumoniae DETECTED (A) NOT DETECTED Final    Comment: CRITICAL RESULT CALLED TO, READ BACK BY AND VERIFIED WITH: PHRMD C AMEND @0620  02/23/20 BY S GEZAHEGN    Proteus species NOT DETECTED NOT  DETECTED Final   Salmonella species NOT DETECTED NOT DETECTED Final   Serratia marcescens NOT DETECTED NOT DETECTED Final   Haemophilus influenzae NOT DETECTED NOT DETECTED Final   Neisseria meningitidis NOT DETECTED NOT DETECTED Final   Pseudomonas aeruginosa NOT DETECTED NOT DETECTED Final   Stenotrophomonas maltophilia NOT DETECTED NOT DETECTED Final   Candida albicans NOT DETECTED NOT DETECTED Final   Candida auris NOT DETECTED NOT DETECTED Final   Candida glabrata NOT DETECTED NOT DETECTED Final   Candida krusei NOT DETECTED NOT DETECTED Final   Candida parapsilosis NOT DETECTED NOT DETECTED Final   Candida tropicalis NOT DETECTED NOT DETECTED Final   Cryptococcus neoformans/gattii NOT DETECTED NOT DETECTED Final   CTX-M ESBL NOT DETECTED NOT DETECTED Final   Carbapenem resistance IMP NOT DETECTED NOT DETECTED Final   Carbapenem resistance KPC NOT DETECTED NOT DETECTED Final   Carbapenem resistance NDM NOT DETECTED NOT DETECTED Final   Carbapenem resist OXA 48 LIKE NOT DETECTED NOT DETECTED Final   Carbapenem resistance VIM NOT DETECTED NOT DETECTED Final    Comment: Performed at [facility], [address] [place],  [address]  SARS Coronavirus 2 by RT PCR (hospital order, performed in [facility] hospital lab) Nasopharyngeal Nasopharyngeal Swab     Status: None   Collection Time: 02/22/20 12:35 PM   Specimen: Nasopharyngeal Swab  Result Value Ref Range Status   SARS Coronavirus 2 NEGATIVE NEGATIVE Final    Comment: (NOTE) SARS-CoV-2 target nucleic acids are NOT DETECTED.  The SARS-CoV-2 RNA is generally detectable in upper and lower respiratory specimens during the acute phase of infection. The lowest concentration of SARS-CoV-2 viral copies this assay can detect is 250 copies / mL. A negative result does not preclude SARS-CoV-2 infection and should not be used as the sole basis for treatment or other patient management decisions.  A negative result may occur  with improper specimen collection / handling, submission  of specimen other than nasopharyngeal swab, presence of viral mutation(s) within the areas targeted by this assay, and inadequate number of viral copies (<250 copies / mL). A negative result must be combined with clinical observations, patient history, and epidemiological information.  Fact Sheet for Patients:   StrictlyIdeas.no  Fact Sheet for Healthcare Providers: BankingDealers.co.za  This test is not yet approved or  cleared by the [place] FDA and has been authorized for detection and/or diagnosis of SARS-CoV-2 by FDA under an Emergency Use Authorization (EUA).  This EUA will remain in effect (meaning this test can be used) for the duration of the COVID-19 declaration under Section 564(b)(1) of the Act, 21 U.S.C. section 360bbb-3(b)(1), unless the authorization is terminated or revoked sooner.  Performed at [facility], [place] [address], [place], [place] [address]   Blood Culture (routine x 2)     Status: Abnormal   Collection Time: 02/22/20  1:16 PM   Specimen: BLOOD  Result Value Ref Range Status   Specimen Description BLOOD SITE NOT SPECIFIED  Final   Special Requests   Final    BOTTLES DRAWN AEROBIC AND ANAEROBIC Blood Culture adequate volume   Culture  Setup Time   Final    GRAM NEGATIVE RODS IN BOTH AEROBIC AND ANAEROBIC BOTTLES CRITICAL VALUE NOTED.  VALUE IS CONSISTENT WITH PREVIOUSLY REPORTED AND CALLED VALUE.    Culture (A)  Final    KLEBSIELLA PNEUMONIAE SUSCEPTIBILITIES PERFORMED ON PREVIOUS CULTURE WITHIN THE LAST 5 DAYS. Performed at [facility], [place] [address], [place], [place] [address]    Report Status 02/26/2020 FINAL  Final  MRSA PCR Screening     Status: None   Collection Time: 02/23/20 11:40 AM   Specimen: Nasal Mucosa; Nasopharyngeal  Result Value Ref Range Status   MRSA by PCR NEGATIVE NEGATIVE Final    Comment:        The  GeneXpert MRSA Assay (FDA approved for NASAL specimens only), is one component of a comprehensive MRSA colonization surveillance program. It is not intended to diagnose MRSA infection nor to guide or monitor treatment for MRSA infections. Performed at [facility], [place] [address] [place], [place] [address]   Culture, blood (Routine X 2) w Reflex to ID Panel     Status: None (Preliminary result)   Collection Time: 02/24/20 11:27 AM   Specimen: BLOOD LEFT ARM  Result Value Ref Range Status   Specimen Description BLOOD LEFT ARM  Final   Special Requests   Final    BOTTLES DRAWN AEROBIC AND ANAEROBIC Blood Culture adequate volume   Culture   Final    NO GROWTH 2 DAYS Performed at [facility], [place] [address], [place], [place] [address]    Report Status PENDING  Incomplete  Culture, blood (Routine X 2) w Reflex to ID Panel     Status: None (Preliminary result)   Collection Time: 02/24/20 11:29 AM   Specimen: BLOOD RIGHT HAND  Result Value Ref Range Status   Specimen Description BLOOD RIGHT HAND  Final   Special Requests   Final    BOTTLES DRAWN AEROBIC ONLY Blood Culture results may not be optimal due to an inadequate volume of blood received in culture bottles   Culture   Final    NO GROWTH 2 DAYS Performed at [facility], [place] [address], [place], [place] [address]    Report Status PENDING  Incomplete      Radiology Studies: No  results found.    Scheduled Meds:  apixaban  5 mg Oral BID   Chlorhexidine Gluconate Cloth  6 each Topical Daily   docusate sodium  100 mg Oral BID   insulin aspart  0-15 Units Subcutaneous TID WC   insulin aspart  0-5 Units Subcutaneous QHS   metoprolol tartrate  12.5 mg Oral BID   polyethylene glycol  17 g Oral Daily   sodium chloride flush  3 mL Intravenous Q12H   Continuous Infusions:  sodium chloride     ceFEPime (MAXIPIME) IV 2 g (02/26/20 1036)     LOS: 4 days      Time spent: 25 minutes    Dessa Phi, DO [facility] 02/26/2020, 12:47 PM   Available via Epic secure chat 7am-7pm After these hours, please refer to coverage provider listed on [email or web address]

## 2020-02-26 NOTE — Discharge Instructions (Signed)

## 2020-02-26 NOTE — Evaluation (Signed)
Occupational Therapy Evaluation Patient Details Name: Emmanual [name] MRN: [id number] DOB: 10/10/1945 Today's Date: 02/26/2020    History of Present Illness pt is a 74 yo male who presents to the ED with vomitting, diarrhea, poor appetite, AMS, and general weakness. Found to have sepsis likely from UTI and L upper and lower lobe PNA. Pt has a PMH of HTN, T2DM, a-fib, and prostate cancer. Pt has an indwelling foley catheter    Clinical Impression   Pt admitted with the above. Pt currently with functional limitations due to the deficits listed below (see OT Problem List).  Pt will benefit from skilled OT to increase their safety and independence with ADL and functional mobility for ADL to facilitate discharge to venue listed below.      Follow Up Recommendations  Home health OT;Supervision - Intermittent    Equipment Recommendations  None recommended by OT    Recommendations for Other Services       Precautions / Restrictions Precautions Precautions: Fall      Mobility Bed Mobility Overal bed mobility: Needs Assistance Bed Mobility: Supine to Sit;Sit to Supine     Supine to sit: Supervision Sit to supine: Supervision   General bed mobility comments: pt required supervision for safety with bed mobility  Transfers Overall transfer level: Needs assistance Equipment used: None Transfers: Sit to/from Stand;Stand Pivot Transfers Sit to Stand: Min assist Stand pivot transfers: Min assist       General transfer comment: bed to chair and back to bed    Balance Overall balance assessment: Needs assistance Sitting-balance support: Feet supported;No upper extremity supported Sitting balance-Leahy Scale: Good     Standing balance support: No upper extremity supported Standing balance-Leahy Scale: Fair                             ADL either performed or assessed with clinical judgement   ADL Overall ADL's : Needs assistance/impaired Eating/Feeding: Set  up;Sitting   Grooming: Set up;Sitting   Upper Body Bathing: Set up;Sitting   Lower Body Bathing: Moderate assistance;Sit to/from stand;Cueing for safety;Cueing for sequencing   Upper Body Dressing : Set up;Sitting   Lower Body Dressing: Moderate assistance;Sit to/from stand;Cueing for safety;Cueing for sequencing   Toilet Transfer: Minimal assistance;Cueing for safety;Cueing for sequencing;RW   Toileting- Clothing Manipulation and Hygiene: Minimal assistance;Sit to/from stand;Cueing for safety;Cueing for sequencing         General ADL Comments: pt stated OT was pushy in a joking manner.  Pt pleasant and participative- on his own terms.     Vision Patient Visual Report: No change from baseline              Pertinent Vitals/Pain Pain Assessment: No/denies pain     Hand Dominance     Extremity/Trunk Assessment Upper Extremity Assessment Upper Extremity Assessment: Generalized weakness       Cervical / Trunk Assessment Cervical / Trunk Assessment: Normal   Communication Communication Communication: No difficulties   Cognition Arousal/Alertness: Awake/alert Behavior During Therapy: Agitated Overall Cognitive Status: No family/caregiver present to determine baseline cognitive functioning                                 General Comments: Pt was A&O x 4. pt was notably agitated upon entry. Pt stated that he wants to do what he wants and was unwilling to do any walking   General Comments  Home Living Family/patient expects to be discharged to:: Private residence Living Arrangements: Children;Spouse/significant other Available Help at Discharge: Family;Available 24 hours/day Type of Home: House Home Access: Stairs to enter [employer] of Steps: 3 Entrance Stairs-Rails: Right Home Layout: Two level Alternate Level Stairs-Number of Steps: 14 Alternate Level Stairs-Rails: Right Bathroom Shower/Tub: Animal nutritionist: Standard Bathroom Accessibility: Yes   Home Equipment: Cane - single point;Shower seat;Grab bars - tub/shower   Additional Comments: pt lives with his wife at his daughter's home. wife is Psychologist, prison and probation services       Prior Functioning/Environment Level of Independence: Independent        Comments: pt states he is retired and doesn't do anything he doesn't want to do. states he walks 1 mile and works out at [employer] to strengthen his muscles        OT Problem List: Decreased strength;Impaired balance (sitting and/or standing);Decreased activity tolerance      OT Treatment/Interventions: Self-care/ADL training;Patient/family education;DME and/or AE instruction    OT Goals(Current goals can be found in the care plan section) Acute Rehab OT Goals Patient Stated Goal: get me home already OT Goal Formulation: With patient Time For Goal Achievement: 03/11/20 Potential to Achieve Goals: Good ADL Goals Pt Will Perform Lower Body Dressing: with supervision;sit to/from stand Pt Will Transfer to Toilet: with supervision;ambulating Pt Will Perform Toileting - Clothing Manipulation and hygiene: with supervision;sit to/from stand  OT Frequency: Min 2X/week   Barriers to D/C:               AM-PAC OT "6 Clicks" Daily Activity     Outcome Measure Help from another person eating meals?: None Help from another person taking care of personal grooming?: A Little Help from another person toileting, which includes using toliet, bedpan, or urinal?: A Little Help from another person bathing (including washing, rinsing, drying)?: A Little Help from another person to put on and taking off regular upper body clothing?: A Little Help from another person to put on and taking off regular lower body clothing?: A Little 6 Click Score: 19   End of Session Equipment Utilized During Treatment: Rolling walker Nurse Communication: Mobility status  Activity Tolerance: Patient tolerated  treatment well Patient left: in bed  OT Visit Diagnosis: Unsteadiness on feet (R26.81);Other abnormalities of gait and mobility (R26.89);Muscle weakness (generalized) (M62.81)                Time: 1350-1410 OT Time Calculation (min): 20 min Charges:  OT General Charges $OT Visit: 1 Visit OT Evaluation $OT Eval Moderate Complexity: 1 Mod  Kari Baars, [place] [phone] Office- [phone], Edwena Felty D 02/26/2020, 4:46 PM

## 2020-02-27 LAB — BASIC METABOLIC PANEL
Anion gap: 9 (ref 5–15)
BUN: 17 mg/dL (ref 8–23)
CO2: 25 mmol/L (ref 22–32)
Calcium: 8.3 mg/dL — ABNORMAL LOW (ref 8.9–10.3)
Chloride: 107 mmol/L (ref 98–111)
Creatinine, Ser: 1.16 mg/dL (ref 0.61–1.24)
GFR calc Af Amer: >60 mL/min (ref >60)
GFR calc non Af Amer: >60 mL/min (ref >60)
Glucose, Bld: 106 mg/dL — ABNORMAL HIGH (ref 70–99)
Potassium: 3.8 mmol/L (ref 3.5–5.1)
Sodium: 141 mmol/L (ref 135–145)

## 2020-02-27 LAB — CBC
HCT: 28 % — ABNORMAL LOW (ref 39.0–52.0)
Hemoglobin: 8.8 g/dL — ABNORMAL LOW (ref 13.0–17.0)
MCH: 26.7 pg (ref 26.0–34.0)
MCHC: 31.4 g/dL (ref 30.0–36.0)
MCV: 85.1 fL (ref 80.0–100.0)
Platelets: 146 10*3/uL — ABNORMAL LOW (ref 150–400)
RBC: 3.29 MIL/uL — ABNORMAL LOW (ref 4.22–5.81)
RDW: 13.8 % (ref 11.5–15.5)
WBC: 11.3 10*3/uL — ABNORMAL HIGH (ref 4.0–10.5)
nRBC: 0 % (ref 0.0–0.2)

## 2020-02-27 LAB — GLUCOSE, CAPILLARY
Glucose-Capillary: 91 mg/dL (ref 70–99)
Glucose-Capillary: 110 mg/dL — ABNORMAL HIGH (ref 70–99)
Glucose-Capillary: 88 mg/dL (ref 70–99)
Glucose-Capillary: 138 mg/dL — ABNORMAL HIGH (ref 70–99)

## 2020-02-27 LAB — MAGNESIUM: Magnesium: 1.5 mg/dL — ABNORMAL LOW (ref 1.7–2.4)

## 2020-02-27 MED ORDER — MAGNESIUM SULFATE 2 GM/50ML IV SOLN
2.0000 g | Freq: Once | INTRAVENOUS | Status: AC
  Administered 2020-02-27: 2 g via INTRAVENOUS
  Filled 2020-02-27: qty 50

## 2020-02-27 MED ORDER — CHLORHEXIDINE GLUCONATE CLOTH 2 % EX PADS
6.0000 | MEDICATED_PAD | Freq: Every day | CUTANEOUS | Status: DC
  Administered 2020-02-27: 6 via TOPICAL

## 2020-02-27 MED ORDER — PANTOPRAZOLE SODIUM 40 MG PO TBEC
40.0000 mg | DELAYED_RELEASE_TABLET | Freq: Every day | ORAL | Status: DC
  Administered 2020-02-27 – 2020-02-28 (×2): 40 mg via ORAL
  Filled 2020-02-27 (×2): qty 1

## 2020-02-27 NOTE — Progress Notes (Signed)
PROGRESS NOTE    Nathan [name]  EGB:[id number] DOB: Dec 16, 1945 DOA: 02/22/2020 PCP: [name], MD     Brief Narrative:  Nathan [name] is a 74 year old male with past medical history significant for A. fib on Eliquis, prostate cancer on radiation therapy, hypertension, diabetes, who was brought to the emergency department with chief complaint of altered mental status.  Per family member, patient had been recently diagnosed with prostate cancer and started on radiation therapy.  He went to see his urologist for regular checkup, noted to have foul-smelling urine, and urinalysis turned out to be negative.  He was doing well, but started to get weaker with poor appetite.  Morning of admission, he started vomiting with diarrhea and was found to be confused.  He was brought to the emergency department via EMS.  In the emergency department, he was found to be in A. fib RVR as well as septic shock.  Blood cultures and urine culture returned with Klebsiella.  He was weaned off pressor and transferred to hospitalist service on 8/26.  New events last 24 hours / Subjective: Has no new complaints, has not felt febrile T-max 100.7 around 3:00 yesterday afternoon  Assessment & Plan:   Active Problems:   Septic shock (HCC)  Septic shock secondary to Klebsiella bacteremia as well as complicated UTI, related to chronic indwelling Foley catheter, present on admission -Off pressors -Leukocytosis has remained relatively stable -Repeat blood cultures 8/25 negative to date  -Continue IV cefepime until afebrile 24 hours  A. fib with RVR -Eliquis -Continue Lopressor -Rate well controlled  AKI  -Baseline creatinine 1 -Resolved  Essential hypertension -Continue Lopressor  Type 2 diabetes -Sliding scale insulin  Hypomagnesemia -Replaced, trend   DVT prophylaxis:  SCDs Start: 02/22/20 1819 apixaban (ELIQUIS) tablet 5 mg  Code Status: Full code Family Communication: No family at bedside,  discussed with daughter over the phone 8/28  Disposition Plan:   Status is: Inpatient  Remains inpatient appropriate because:IV treatments appropriate due to intensity of illness or inability to take PO   Dispo: The patient is from: Home              Anticipated d/c is to: Home              Anticipated d/c date is: 1 day              Patient currently is not medically stable to d/c.  Plan for discharge home 8/29 as long as he remains afebrile 24 hours.   Consultants:   PCCM admission   Antimicrobials:  Anti-infectives (From admission, onward)   Start     Dose/Rate Route Frequency Ordered Stop   02/25/20 1600  ceFEPIme (MAXIPIME) 2 g in sodium chloride 0.9 % 100 mL IVPB        2 g 200 mL/hr over 30 Minutes Intravenous Every 12 hours 02/25/20 1539     02/25/20 1400  cefTRIAXone (ROCEPHIN) 2 g in dextrose 5 % 50 mL IVPB - Premix  Status:  Discontinued        2 g 100 mL/hr over 30 Minutes Intravenous Every 24 hours 02/25/20 1307 02/25/20 1330   02/25/20 1345  sulfamethoxazole-trimethoprim (BACTRIM DS) 800-160 MG per tablet 1 tablet  Status:  Discontinued        1 tablet Oral Every 12 hours 02/25/20 1330 02/25/20 1513   02/25/20 1215  ceFEPIme (MAXIPIME) 2 g in sodium chloride 0.9 % 100 mL IVPB  Status:  Discontinued  2 g 200 mL/hr over 30 Minutes Intravenous Every 12 hours 02/25/20 1200 02/25/20 1307   02/24/20 1400  ceFEPIme (MAXIPIME) 2 g in sodium chloride 0.9 % 100 mL IVPB  Status:  Discontinued        2 g 200 mL/hr over 30 Minutes Intravenous Every 24 hours 02/24/20 1006 02/25/20 1200   02/24/20 1030  cefTRIAXone (ROCEPHIN) 2 g in sodium chloride 0.9 % 100 mL IVPB  Status:  Discontinued        2 g 200 mL/hr over 30 Minutes Intravenous Every 24 hours 02/24/20 0943 02/24/20 1006   02/23/20 1400  ceFEPIme (MAXIPIME) 2 g in sodium chloride 0.9 % 100 mL IVPB  Status:  Discontinued        2 g 200 mL/hr over 30 Minutes Intravenous Every 24 hours 02/22/20 1406 02/24/20 0943    02/22/20 1830  ceFEPIme (MAXIPIME) 2 g in sodium chloride 0.9 % 100 mL IVPB  Status:  Discontinued        2 g 200 mL/hr over 30 Minutes Intravenous  Once 02/22/20 1822 02/22/20 1833   02/22/20 1405  vancomycin variable dose per unstable renal function (pharmacist dosing)  Status:  Discontinued         Does not apply See admin instructions 02/22/20 1405 02/23/20 0745   02/22/20 1315  vancomycin (VANCOREADY) IVPB 1500 mg/300 mL        1,500 mg 150 mL/hr over 120 Minutes Intravenous  Once 02/22/20 1306 02/22/20 1639   02/22/20 1245  ceFEPIme (MAXIPIME) 2 g in sodium chloride 0.9 % 100 mL IVPB        2 g 200 mL/hr over 30 Minutes Intravenous  Once 02/22/20 1230 02/22/20 1405   02/22/20 1245  metroNIDAZOLE (FLAGYL) IVPB 500 mg        500 mg 100 mL/hr over 60 Minutes Intravenous  Once 02/22/20 1230 02/22/20 1444   02/22/20 1245  vancomycin (VANCOCIN) IVPB 1000 mg/200 mL premix  Status:  Discontinued        1,000 mg 200 mL/hr over 60 Minutes Intravenous  Once 02/22/20 1230 02/22/20 1306       Objective: Vitals:   02/26/20 0545 02/26/20 1507 02/26/20 2035 02/27/20 0413  BP: 131/76 135/82 132/80 131/81  Pulse: 90 87 88 89  Resp: 18 18 18 18   Temp: 100.1 F (37.8 C) (!) 100.7 F (38.2 C) 100.2 F (37.9 C) 100.1 F (37.8 C)  TempSrc: Oral Oral Oral Oral  SpO2: 95% 97% 97% 98%  Weight:      Height:        Intake/Output Summary (Last 24 hours) at 02/27/2020 1001 Last data filed at 02/27/2020 0646 Gross per 24 hour  Intake --  Output 1200 ml  Net -1200 ml   Filed Weights   02/22/20 1300 02/22/20 1717  Weight: 78 kg 78 kg    Examination: General exam: Appears calm and comfortable  Respiratory system: Clear to auscultation. Respiratory effort normal. Cardiovascular system: S1 & S2 heard, RRR. No pedal edema. Gastrointestinal system: Abdomen is nondistended, soft and nontender. Normal bowel sounds heard. Central nervous system: Alert and oriented. Non focal exam. Speech clear   Extremities: Symmetric in appearance bilaterally  Skin: No rashes, lesions or ulcers on exposed skin.  Large chest wall lipoma noted on the left Psychiatry: Judgement and insight appear stable. Mood & affect appropriate.   Data Reviewed: I have personally reviewed following labs and imaging studies  CBC: Recent Labs  Lab 02/22/20 1315 02/22/20 1906 02/23/20  5631 02/24/20 0559 02/25/20 0757 02/26/20 0044 02/27/20 0305  WBC 9.3   < > 14.2* 13.4* 12.0* 10.7* 11.3*  NEUTROABS 8.5*  --   --   --   --   --   --   HGB 11.9*   < > 10.4* 9.8* 9.3* 8.7* 8.8*  HCT 39.4   < > 32.9* 30.2* 28.2* 27.5* 28.0*  MCV 90.0   < > 87.7 85.6 84.9 84.1 85.1  PLT 168   < > 101* 99* 103* 117* 146*   < > = values in this interval not displayed.   Basic Metabolic Panel: Recent Labs  Lab 02/22/20 1715 02/22/20 1906 02/23/20 0911 02/24/20 0559 02/25/20 0757 02/26/20 0044 02/27/20 0305  NA  --   --  141 142 141 142 141  K  --   --  4.3 4.0 3.6 3.4* 3.8  CL  --   --  106 107 108 107 107  CO2  --   --  24 23 26 25 25   GLUCOSE  --   --  124* 102* 103* 102* 106*  BUN  --   --  65* 56* 33* 27* 17  CREATININE  --    < > 3.20* 1.84* 1.28* 1.27* 1.16  CALCIUM  --   --  7.8* 8.1* 8.1* 8.1* 8.3*  MG 1.2*  --   --  2.0  --   --  1.5*  PHOS 2.2*  --   --  2.4*  --   --   --    < > = values in this interval not displayed.   GFR: Estimated Creatinine Clearance: 59.8 mL/min (by C-G formula based on SCr of 1.16 mg/dL). Liver Function Tests: Recent Labs  Lab 02/22/20 1233  AST 44*  ALT 14  ALKPHOS 75  BILITOT 0.9  PROT 6.2*  ALBUMIN 2.8*   No results for input(s): LIPASE, AMYLASE in the last 168 hours. No results for input(s): AMMONIA in the last 168 hours. Coagulation Profile: Recent Labs  Lab 02/22/20 1233 02/23/20 0911  INR 1.8* 1.9*   Cardiac Enzymes: Recent Labs  Lab 02/22/20 1715  CKTOTAL 336   BNP (last 3 results) No results for input(s): PROBNP in the last 8760 hours. HbA1C: No  results for input(s): HGBA1C in the last 72 hours. CBG: Recent Labs  Lab 02/26/20 0807 02/26/20 1211 02/26/20 1724 02/26/20 2024 02/27/20 0751  GLUCAP 102* 132* 100* 99 91   Lipid Profile: No results for input(s): CHOL, HDL, LDLCALC, TRIG, CHOLHDL, LDLDIRECT in the last 72 hours. Thyroid Function Tests: No results for input(s): TSH, T4TOTAL, FREET4, T3FREE, THYROIDAB in the last 72 hours. Anemia Panel: No results for input(s): VITAMINB12, FOLATE, FERRITIN, TIBC, IRON, RETICCTPCT in the last 72 hours. Sepsis Labs: Recent Labs  Lab 02/22/20 1233 02/22/20 1555 02/23/20 0911  PROCALCITON  --   --  >150.00  LATICACIDVEN >11.0* 9.9* 2.7*    Recent Results (from the past 240 hour(s))  Blood Culture (routine x 2)     Status: Abnormal   Collection Time: 02/22/20 12:33 PM   Specimen: BLOOD RIGHT FOREARM  Result Value Ref Range Status   Specimen Description BLOOD RIGHT FOREARM  Final   Special Requests   Final    BOTTLES DRAWN AEROBIC AND ANAEROBIC Blood Culture results may not be optimal due to an inadequate volume of blood received in culture bottles   Culture  Setup Time   Final    GRAM NEGATIVE RODS IN BOTH AEROBIC  AND ANAEROBIC BOTTLES Organism ID to follow CRITICAL RESULT CALLED TO, READ BACK BY AND VERIFIED WITH: PHRMD C AMEND @0620  02/23/20 BY S GEZAHEGN Performed at [facility], [address], [place], [place] [address]    Culture KLEBSIELLA PNEUMONIAE ENTEROBACTER AEROGENES  (A)  Final   Report Status 02/26/2020 FINAL  Final   Organism ID, Bacteria KLEBSIELLA PNEUMONIAE  Final   Organism ID, Bacteria ENTEROBACTER AEROGENES  Final      Susceptibility   Enterobacter aerogenes - MIC*    CEFAZOLIN >=64 RESISTANT Resistant     CEFEPIME <=0.12 SENSITIVE Sensitive     CEFTAZIDIME <=1 SENSITIVE Sensitive     CEFTRIAXONE <=0.25 SENSITIVE Sensitive     CIPROFLOXACIN <=0.25 SENSITIVE Sensitive     GENTAMICIN <=1 SENSITIVE Sensitive     IMIPENEM 1 SENSITIVE Sensitive      TRIMETH/SULFA <=20 SENSITIVE Sensitive     PIP/TAZO <=4 SENSITIVE Sensitive     * ENTEROBACTER AEROGENES   Klebsiella pneumoniae - MIC*    AMPICILLIN RESISTANT Resistant     CEFAZOLIN <=4 SENSITIVE Sensitive     CEFEPIME <=0.12 SENSITIVE Sensitive     CEFTAZIDIME <=1 SENSITIVE Sensitive     CEFTRIAXONE <=0.25 SENSITIVE Sensitive     CIPROFLOXACIN <=0.25 SENSITIVE Sensitive     GENTAMICIN <=1 SENSITIVE Sensitive     IMIPENEM <=0.25 SENSITIVE Sensitive     TRIMETH/SULFA <=20 SENSITIVE Sensitive     AMPICILLIN/SULBACTAM 4 SENSITIVE Sensitive     PIP/TAZO <=4 SENSITIVE Sensitive     * KLEBSIELLA PNEUMONIAE  Urine culture     Status: Abnormal   Collection Time: 02/22/20 12:33 PM   Specimen: In/Out Cath Urine  Result Value Ref Range Status   Specimen Description IN/OUT CATH URINE  Final   Special Requests   Final    NONE Performed at [facility], [place] [address] [place], [place] [address]    Culture (A)  Final    >=100,000 COLONIES/mL KLEBSIELLA PNEUMONIAE 70,000 COLONIES/mL ENTEROBACTER AEROGENES    Report Status 02/25/2020 FINAL  Final   Organism ID, Bacteria KLEBSIELLA PNEUMONIAE (A)  Final   Organism ID, Bacteria ENTEROBACTER AEROGENES (A)  Final      Susceptibility   Enterobacter aerogenes - MIC*    CEFAZOLIN >=64 RESISTANT Resistant     CEFEPIME <=0.12 SENSITIVE Sensitive     CEFTRIAXONE <=0.25 SENSITIVE Sensitive     CIPROFLOXACIN <=0.25 SENSITIVE Sensitive     GENTAMICIN <=1 SENSITIVE Sensitive     IMIPENEM 2 SENSITIVE Sensitive     NITROFURANTOIN 64 INTERMEDIATE Intermediate     TRIMETH/SULFA <=20 SENSITIVE Sensitive     PIP/TAZO <=4 SENSITIVE Sensitive     * 70,000 COLONIES/mL ENTEROBACTER AEROGENES   Klebsiella pneumoniae - MIC*    AMPICILLIN RESISTANT Resistant     CEFAZOLIN <=4 SENSITIVE Sensitive     CEFTRIAXONE <=0.25 SENSITIVE Sensitive     CIPROFLOXACIN <=0.25 SENSITIVE Sensitive     GENTAMICIN <=1 SENSITIVE Sensitive     IMIPENEM <=0.25  SENSITIVE Sensitive     NITROFURANTOIN <=16 SENSITIVE Sensitive     TRIMETH/SULFA <=20 SENSITIVE Sensitive     AMPICILLIN/SULBACTAM 4 SENSITIVE Sensitive     PIP/TAZO <=4 SENSITIVE Sensitive     * >=100,000 COLONIES/mL KLEBSIELLA PNEUMONIAE  Blood Culture ID Panel (Reflexed)     Status: Abnormal   Collection Time: 02/22/20 12:33 PM  Result Value Ref Range Status   Enterococcus faecalis NOT DETECTED NOT DETECTED Final   Enterococcus Faecium NOT DETECTED  NOT DETECTED Final   Listeria monocytogenes NOT DETECTED NOT DETECTED Final   Staphylococcus species NOT DETECTED NOT DETECTED Final   Staphylococcus aureus (BCID) NOT DETECTED NOT DETECTED Final   Staphylococcus epidermidis NOT DETECTED NOT DETECTED Final   Staphylococcus lugdunensis NOT DETECTED NOT DETECTED Final   Streptococcus species NOT DETECTED NOT DETECTED Final   Streptococcus agalactiae NOT DETECTED NOT DETECTED Final   Streptococcus pneumoniae NOT DETECTED NOT DETECTED Final   Streptococcus pyogenes NOT DETECTED NOT DETECTED Final   A.calcoaceticus-baumannii NOT DETECTED NOT DETECTED Final   Bacteroides fragilis NOT DETECTED NOT DETECTED Final   Enterobacterales DETECTED (A) NOT DETECTED Final    Comment: CRITICAL RESULT CALLED TO, READ BACK BY AND VERIFIED WITH: PHRMD C AMEND @0620  02/23/20 BY S GEZAHEGN    Enterobacter cloacae complex NOT DETECTED NOT DETECTED Final   Escherichia coli NOT DETECTED NOT DETECTED Final   Klebsiella aerogenes DETECTED (A) NOT DETECTED Final    Comment: CRITICAL RESULT CALLED TO, READ BACK BY AND VERIFIED WITH: PHRMD C AMEND @0620  02/23/20 BY S GEZAHEGN    Klebsiella oxytoca NOT DETECTED NOT DETECTED Final   Klebsiella pneumoniae DETECTED (A) NOT DETECTED Final    Comment: CRITICAL RESULT CALLED TO, READ BACK BY AND VERIFIED WITH: PHRMD C AMEND @0620  02/23/20 BY S GEZAHEGN    Proteus species NOT DETECTED NOT DETECTED Final   Salmonella species NOT DETECTED NOT DETECTED Final   Serratia  marcescens NOT DETECTED NOT DETECTED Final   Haemophilus influenzae NOT DETECTED NOT DETECTED Final   Neisseria meningitidis NOT DETECTED NOT DETECTED Final   Pseudomonas aeruginosa NOT DETECTED NOT DETECTED Final   Stenotrophomonas maltophilia NOT DETECTED NOT DETECTED Final   Candida albicans NOT DETECTED NOT DETECTED Final   Candida auris NOT DETECTED NOT DETECTED Final   Candida glabrata NOT DETECTED NOT DETECTED Final   Candida krusei NOT DETECTED NOT DETECTED Final   Candida parapsilosis NOT DETECTED NOT DETECTED Final   Candida tropicalis NOT DETECTED NOT DETECTED Final   Cryptococcus neoformans/gattii NOT DETECTED NOT DETECTED Final   CTX-M ESBL NOT DETECTED NOT DETECTED Final   Carbapenem resistance IMP NOT DETECTED NOT DETECTED Final   Carbapenem resistance KPC NOT DETECTED NOT DETECTED Final   Carbapenem resistance NDM NOT DETECTED NOT DETECTED Final   Carbapenem resist OXA 48 LIKE NOT DETECTED NOT DETECTED Final   Carbapenem resistance VIM NOT DETECTED NOT DETECTED Final    Comment: Performed at [facility], [address], [place], [place] [address]  SARS Coronavirus 2 by RT PCR (hospital order, performed in [facility] hospital lab) Nasopharyngeal Nasopharyngeal Swab     Status: None   Collection Time: 02/22/20 12:35 PM   Specimen: Nasopharyngeal Swab  Result Value Ref Range Status   SARS Coronavirus 2 NEGATIVE NEGATIVE Final    Comment: (NOTE) SARS-CoV-2 target nucleic acids are NOT DETECTED.  The SARS-CoV-2 RNA is generally detectable in upper and lower respiratory specimens during the acute phase of infection. The lowest concentration of SARS-CoV-2 viral copies this assay can detect is 250 copies / mL. A negative result does not preclude SARS-CoV-2 infection and should not be used as the sole basis for treatment or other patient management decisions.  A negative result may occur with improper specimen collection / handling, submission of specimen other than  nasopharyngeal swab, presence of viral mutation(s) within the areas targeted by this assay, and inadequate number of viral copies (<250 copies / mL). A negative result must be combined with clinical  observations, patient history, and epidemiological information.  Fact Sheet for Patients:   StrictlyIdeas.no  Fact Sheet for Healthcare Providers: BankingDealers.co.za  This test is not yet approved or  cleared by the [place] FDA and has been authorized for detection and/or diagnosis of SARS-CoV-2 by FDA under an Emergency Use Authorization (EUA).  This EUA will remain in effect (meaning this test can be used) for the duration of the COVID-19 declaration under Section 564(b)(1) of the Act, 21 U.S.C. section 360bbb-3(b)(1), unless the authorization is terminated or revoked sooner.  Performed at [facility], [place] [address], [place], [place] [address]   Blood Culture (routine x 2)     Status: Abnormal   Collection Time: 02/22/20  1:16 PM   Specimen: BLOOD  Result Value Ref Range Status   Specimen Description BLOOD SITE NOT SPECIFIED  Final   Special Requests   Final    BOTTLES DRAWN AEROBIC AND ANAEROBIC Blood Culture adequate volume   Culture  Setup Time   Final    GRAM NEGATIVE RODS IN BOTH AEROBIC AND ANAEROBIC BOTTLES CRITICAL VALUE NOTED.  VALUE IS CONSISTENT WITH PREVIOUSLY REPORTED AND CALLED VALUE.    Culture (A)  Final    KLEBSIELLA PNEUMONIAE SUSCEPTIBILITIES PERFORMED ON PREVIOUS CULTURE WITHIN THE LAST 5 DAYS. Performed at [facility], [place] [address] [place], [place] [address]    Report Status 02/26/2020 FINAL  Final  MRSA PCR Screening     Status: None   Collection Time: 02/23/20 11:40 AM   Specimen: Nasal Mucosa; Nasopharyngeal  Result Value Ref Range Status   MRSA by PCR NEGATIVE NEGATIVE Final    Comment:        The GeneXpert MRSA Assay (FDA approved for NASAL specimens only), is one component  of a comprehensive MRSA colonization surveillance program. It is not intended to diagnose MRSA infection nor to guide or monitor treatment for MRSA infections. Performed at [facility], [place] [address], [place], [place] [address]   Culture, blood (Routine X 2) w Reflex to ID Panel     Status: None (Preliminary result)   Collection Time: 02/24/20 11:27 AM   Specimen: BLOOD LEFT ARM  Result Value Ref Range Status   Specimen Description BLOOD LEFT ARM  Final   Special Requests   Final    BOTTLES DRAWN AEROBIC AND ANAEROBIC Blood Culture adequate volume   Culture   Final    NO GROWTH 2 DAYS Performed at [facility], [place] [address], [place], [place] [address]    Report Status PENDING  Incomplete  Culture, blood (Routine X 2) w Reflex to ID Panel     Status: None (Preliminary result)   Collection Time: 02/24/20 11:29 AM   Specimen: BLOOD RIGHT HAND  Result Value Ref Range Status   Specimen Description BLOOD RIGHT HAND  Final   Special Requests   Final    BOTTLES DRAWN AEROBIC ONLY Blood Culture results may not be optimal due to an inadequate volume of blood received in culture bottles   Culture   Final    NO GROWTH 2 DAYS Performed at [facility], [place] [address] [place], [place] [address]    Report Status PENDING  Incomplete      Radiology Studies: No results found.    Scheduled Meds: . apixaban  5 mg Oral BID  . docusate sodium  100 mg Oral BID  . insulin aspart  0-15 Units Subcutaneous TID WC  .  insulin aspart  0-5 Units Subcutaneous QHS  . metoprolol tartrate  12.5 mg Oral BID  . polyethylene glycol  17 g Oral Daily  . sodium chloride flush  3 mL Intravenous Q12H   Continuous Infusions: . sodium chloride 250 mL (02/27/20 0857)  . ceFEPime (MAXIPIME) IV 2 g (02/27/20 0859)     LOS: 5 days      Time spent: 25 minutes   Dessa Phi, DO [facility] 02/27/2020, 10:01 AM   Available via Epic secure chat 7am-7pm After these hours,  please refer to coverage provider listed on [email or web address]

## 2020-02-28 LAB — BASIC METABOLIC PANEL
Anion gap: 6 (ref 5–15)
BUN: 16 mg/dL (ref 8–23)
CO2: 25 mmol/L (ref 22–32)
Calcium: 8.1 mg/dL — ABNORMAL LOW (ref 8.9–10.3)
Chloride: 106 mmol/L (ref 98–111)
Creatinine, Ser: 1.05 mg/dL (ref 0.61–1.24)
GFR calc Af Amer: >60 mL/min (ref >60)
GFR calc non Af Amer: >60 mL/min (ref >60)
Glucose, Bld: 127 mg/dL — ABNORMAL HIGH (ref 70–99)
Potassium: 3.7 mmol/L (ref 3.5–5.1)
Sodium: 137 mmol/L (ref 135–145)

## 2020-02-28 LAB — CBC
HCT: 27.3 % — ABNORMAL LOW (ref 39.0–52.0)
Hemoglobin: 8.6 g/dL — ABNORMAL LOW (ref 13.0–17.0)
MCH: 26.8 pg (ref 26.0–34.0)
MCHC: 31.5 g/dL (ref 30.0–36.0)
MCV: 85 fL (ref 80.0–100.0)
Platelets: 205 10*3/uL (ref 150–400)
RBC: 3.21 MIL/uL — ABNORMAL LOW (ref 4.22–5.81)
RDW: 14.1 % (ref 11.5–15.5)
WBC: 11.8 10*3/uL — ABNORMAL HIGH (ref 4.0–10.5)
nRBC: 0.2 % (ref 0.0–0.2)

## 2020-02-28 LAB — GLUCOSE, CAPILLARY: Glucose-Capillary: 120 mg/dL — ABNORMAL HIGH (ref 70–99)

## 2020-02-28 LAB — MAGNESIUM: Magnesium: 1.7 mg/dL (ref 1.7–2.4)

## 2020-02-28 MED ORDER — SULFAMETHOXAZOLE-TRIMETHOPRIM 800-160 MG PO TABS: 1.0000 | ORAL_TABLET | Freq: Two times a day (BID) | ORAL | 0 refills | Status: AC

## 2020-02-28 NOTE — Progress Notes (Signed)
Nathan [name] to be D/C'd  per MD order. Discussed with the patient and all questions fully answered.  VSS, Skin clean, dry and intact without evidence of skin break down, no evidence of skin tears noted.  IV catheter discontinued intact. Site without signs and symptoms of complications. Dressing and pressure applied.  An After Visit Summary was printed and given to the patient. Patient received prescription.  D/c education completed with patient/family including follow up instructions, medication list, d/c activities limitations if indicated, with other d/c instructions as indicated by MD - patient able to verbalize understanding, all questions fully answered.   Patient instructed to return to ED, call 911, or call MD for any changes in condition.   Patient to be escorted via [place], and D/C home via private auto.

## 2020-02-28 NOTE — Discharge Summary (Signed)
Physician Discharge Summary  Nathan [name] KZS:[id number] DOB: 04-13-1946 DOA: 02/22/2020  PCP: [name], MD  Admit date: 02/22/2020 Discharge date: 02/28/2020  Admitted From: Home Disposition:  Home   Recommendations for Outpatient Follow-up:  1. Follow up with PCP in 1 week  Discharge Condition: Stable CODE STATUS: Full  Diet recommendation:  Diet Orders (From admission, onward)    Start     Ordered   02/23/20 1510  Diet Carb Modified Fluid consistency: Thin; Room service appropriate? Yes  Diet effective now       Question Answer Comment  Diet-HS Snack? Nothing   Calorie Level Medium 1600-2000   Fluid consistency: Thin   Room service appropriate? Yes      02/23/20 1509         Brief/Interim Summary: Nathan [name] is a 74 year old male with past medical history significant for A. fib on Eliquis, prostate cancer on radiation therapy, hypertension, diabetes, who was brought to the emergency department with chief complaint of altered mental status.  Per family member, patient had been recently diagnosed with prostate cancer and started on radiation therapy.  He went to see his urologist for regular checkup, noted to have foul-smelling urine, and urinalysis turned out to be negative.  He was doing well, but started to get weaker with poor appetite.  Morning of admission, he started vomiting with diarrhea and was found to be confused.  He was brought to the emergency department via EMS.  In the emergency department, he was found to be in A. fib RVR as well as septic shock.  Blood cultures and urine culture returned with Klebsiella.  He was weaned off pressor and transferred to hospitalist service on 8/26. He continued to IV cefepime due to continued fevers. On day of discharge, he had been afebrile for 24 hours and had no new complaints. He felt ready to discharge home.   Discharge Diagnoses:  Active Problems:   Septic shock (HCC)   Septic shock secondary to Klebsiella  bacteremia as well as complicated UTI, related to chronic indwelling Foley catheter, present on admission -Off pressors -Leukocytosis has remained relatively stable -Repeat blood cultures 8/25 negative to date  -Treated with cefepime IV starting 8/23 --> Bactrim on discharge to cover total 14 day course ending 9/5   A. fib with RVR -Eliquis -Continue toprol  -Rate well controlled now   AKI  -Baseline creatinine 1 -Resolved -Resume lisinopril   Essential hypertension -Continue toprol   Type 2 diabetes -Sliding scale insulin   Discharge Instructions  Discharge Instructions    Call MD for:  difficulty breathing, headache or visual disturbances   Complete by: As directed    Call MD for:  extreme fatigue   Complete by: As directed    Call MD for:  persistant dizziness or light-headedness   Complete by: As directed    Call MD for:  persistant nausea and vomiting   Complete by: As directed    Call MD for:  severe uncontrolled pain   Complete by: As directed    Call MD for:  temperature >100.4   Complete by: As directed    Discharge instructions   Complete by: As directed    You were cared for by a hospitalist during your hospital stay. If you have any questions about your discharge medications or the care you received while you were in the hospital after you are discharged, you can call the unit and ask to speak with the hospitalist on call if the  hospitalist that took care of you is not available. Once you are discharged, your primary care physician will handle any further medical issues. Please note that NO REFILLS for any discharge medications will be authorized once you are discharged, as it is imperative that you return to your primary care physician (or establish a relationship with a primary care physician if you do not have one) for your aftercare needs so that they can reassess your need for medications and monitor your lab values.   Increase activity slowly   Complete  by: As directed      Allergies as of 02/28/2020   No Known Allergies     Medication List    TAKE these medications   apixaban 5 MG Tabs tablet Commonly known as: Eliquis Take 1 tablet (5 mg total) by mouth 2 (two) times daily.   folic acid 1 MG tablet Commonly known as: FOLVITE Take 1 tablet by mouth daily.   lisinopril 20 MG tablet Commonly known as: ZESTRIL Take 20 mg by mouth daily.   magnesium oxide 400 MG tablet Commonly known as: MAG-OX Take 400 mg by mouth daily.   metoprolol succinate 25 MG 24 hr tablet Commonly known as: Toprol XL Take 1 tablet (25 mg total) by mouth daily.   prenatal multivitamin Tabs tablet Take 1 tablet by mouth daily at 12 noon.   sulfamethoxazole-trimethoprim 800-160 MG tablet Commonly known as: BACTRIM DS Take 1 tablet by mouth 2 (two) times daily for 7 days.   vitamin B-12 1000 MCG tablet Commonly known as: CYANOCOBALAMIN Take 1,000 mcg by mouth daily.   Vitamin D (Ergocalciferol) 1.25 MG (50000 UNIT) Caps capsule Commonly known as: DRISDOL Take 50,000 Units by mouth every Sunday.       Follow-up Information    [name], MD. Schedule an appointment as soon as possible for a visit in 1 week(s).   Specialty: Family Medicine Contact information: [place] [address] [phone]              No Known Allergies  Consultations:  PCCM admission    Procedures/Studies: CT ABDOMEN PELVIS WO CONTRAST  Result Date: 02/22/2020 CLINICAL DATA:  Fever, abnormal x-ray.  Possible GI bleed. EXAM: CT CHEST, ABDOMEN AND PELVIS WITHOUT CONTRAST TECHNIQUE: Multidetector CT imaging of the chest, abdomen and pelvis was performed following the standard protocol without IV contrast. COMPARISON:  05/27/2019 FINDINGS: CT CHEST FINDINGS Cardiovascular: Heart is normal size. Calcifications throughout the coronary arteries and aorta. No evidence of aortic aneurysm. Mediastinum/Nodes: No mediastinal, hilar,  or axillary adenopathy. Trachea and esophagus are unremarkable. Thyroid unremarkable. Lungs/Pleura: Patchy ground-glass airspace disease in the left upper lobe. Nodule in the left upper lobe on image 68 measures 6 mm. Airspace disease more inferiorly in the lingula and in the left lower lobe concerning for pneumonia. Right base posterior dependent atelectasis. No effusions. Musculoskeletal: Left-sided gynecomastia. No acute bony abnormality. CT ABDOMEN PELVIS FINDINGS Hepatobiliary: No focal hepatic abnormality. Gallbladder unremarkable. Pancreas: No focal abnormality or ductal dilatation. Spleen: No focal abnormality.  Normal size. Adrenals/Urinary Tract: Bilateral hydronephrosis. Urinary bladder wall is thickened with Foley catheter in place. Soft tissue masslike density seen within the inferior aspect of the bladder measuring 5.2 x 3.8 cm, possibly markedly enlarged prostate. 5 mm calcification within the bladder. 3 mm calcification noted along the right posterior bladder wall which could be within the bladder lumen or at the right UVJ. Bilateral low-density lesions within the kidneys, likely cysts although these  are difficult to characterize on this noncontrast study. Adrenal glands unremarkable. Stomach/Bowel: Large stool burden in the rectosigmoid colon. Left colonic diverticulosis. Possible inflammation/stranding around the mid descending colon suggests possibility of diverticulitis. Small bowel is fluid-filled, nondilated. Vascular/Lymphatic: Aortoiliac atherosclerosis. No aneurysm or adenopathy. Reproductive: Markedly enlarged prostate. Other: No free fluid or free air. Musculoskeletal: No acute bony abnormality. IMPRESSION: Left upper lobe and left lower lobe airspace opacities concerning for pneumonia. Coronary artery disease, aortic atherosclerosis. Right base atelectasis. Asymmetric left gynecomastia. Recommend clinical correlation and possible correlation with mammography to exclude breast cancer.  Moderate bilateral hydronephrosis and hydroureter. Foley catheter is in place with bladder decompressed, but bladder wall appears thickened, possibly related to prostate enlargement and bladder outlet obstruction. Masslike soft tissue density projects into the bladder, possibly related to the enlarged prostate, but difficult to exclude bladder wall mass. Probable 5 mm bladder stone. 3 mm calcification projects near the right UVJ which could be at the UVJ or just into the bladder. Left colonic diverticulosis. Concern for inflammatory stranding adjacent to the mid descending colon concerning for active diverticulitis. Bilateral renal low-density lesions, likely cysts. Electronically Signed   By: Rolm Baptise M.D.   On: 02/22/2020 18:19   CT Chest Wo Contrast  Result Date: 02/22/2020 CLINICAL DATA:  Fever, abnormal x-ray.  Possible GI bleed. EXAM: CT CHEST, ABDOMEN AND PELVIS WITHOUT CONTRAST TECHNIQUE: Multidetector CT imaging of the chest, abdomen and pelvis was performed following the standard protocol without IV contrast. COMPARISON:  05/27/2019 FINDINGS: CT CHEST FINDINGS Cardiovascular: Heart is normal size. Calcifications throughout the coronary arteries and aorta. No evidence of aortic aneurysm. Mediastinum/Nodes: No mediastinal, hilar, or axillary adenopathy. Trachea and esophagus are unremarkable. Thyroid unremarkable. Lungs/Pleura: Patchy ground-glass airspace disease in the left upper lobe. Nodule in the left upper lobe on image 68 measures 6 mm. Airspace disease more inferiorly in the lingula and in the left lower lobe concerning for pneumonia. Right base posterior dependent atelectasis. No effusions. Musculoskeletal: Left-sided gynecomastia. No acute bony abnormality. CT ABDOMEN PELVIS FINDINGS Hepatobiliary: No focal hepatic abnormality. Gallbladder unremarkable. Pancreas: No focal abnormality or ductal dilatation. Spleen: No focal abnormality.  Normal size. Adrenals/Urinary Tract: Bilateral  hydronephrosis. Urinary bladder wall is thickened with Foley catheter in place. Soft tissue masslike density seen within the inferior aspect of the bladder measuring 5.2 x 3.8 cm, possibly markedly enlarged prostate. 5 mm calcification within the bladder. 3 mm calcification noted along the right posterior bladder wall which could be within the bladder lumen or at the right UVJ. Bilateral low-density lesions within the kidneys, likely cysts although these are difficult to characterize on this noncontrast study. Adrenal glands unremarkable. Stomach/Bowel: Large stool burden in the rectosigmoid colon. Left colonic diverticulosis. Possible inflammation/stranding around the mid descending colon suggests possibility of diverticulitis. Small bowel is fluid-filled, nondilated. Vascular/Lymphatic: Aortoiliac atherosclerosis. No aneurysm or adenopathy. Reproductive: Markedly enlarged prostate. Other: No free fluid or free air. Musculoskeletal: No acute bony abnormality. IMPRESSION: Left upper lobe and left lower lobe airspace opacities concerning for pneumonia. Coronary artery disease, aortic atherosclerosis. Right base atelectasis. Asymmetric left gynecomastia. Recommend clinical correlation and possible correlation with mammography to exclude breast cancer. Moderate bilateral hydronephrosis and hydroureter. Foley catheter is in place with bladder decompressed, but bladder wall appears thickened, possibly related to prostate enlargement and bladder outlet obstruction. Masslike soft tissue density projects into the bladder, possibly related to the enlarged prostate, but difficult to exclude bladder wall mass. Probable 5 mm bladder stone. 3 mm calcification projects near the right  UVJ which could be at the UVJ or just into the bladder. Left colonic diverticulosis. Concern for inflammatory stranding adjacent to the mid descending colon concerning for active diverticulitis. Bilateral renal low-density lesions, likely cysts.  Electronically Signed   By: Rolm Baptise M.D.   On: 02/22/2020 18:19   DG Chest Port 1 View  Result Date: 02/24/2020 CLINICAL DATA:  Respiratory difficulty EXAM: PORTABLE CHEST 1 VIEW COMPARISON:  February 22, 2020 chest radiograph and chest CT FINDINGS: There is patchy airspace opacity at several sites in the left lung, most notably in the left lower lobe, similar to recent study. Right lung is clear by radiography. Heart size and pulmonary vascularity are normal. No adenopathy. No bone lesions. IMPRESSION: Persistent patchy airspace opacity in the left, is noted within the left lower lobe region. No new opacity evident. Right lung clear. Stable cardiac silhouette. Electronically Signed   By: Lowella Grip III M.D.   On: 02/24/2020 08:03   DG Chest Portable 1 View  Result Date: 02/22/2020 CLINICAL DATA:  Shortness of breath, altered mental status EXAM: PORTABLE CHEST 1 VIEW COMPARISON:  None. FINDINGS: Patchy left lower lobe airspace disease. No pleural effusion or pneumothorax. Stable cardiomediastinal silhouette. No aggressive osseous lesion. IMPRESSION: Left lower lobe pneumonia. Electronically Signed   By: Kathreen Devoid   On: 02/22/2020 13:08       Discharge Exam: Vitals:   02/27/20 2058 02/28/20 0514  BP: 120/71 127/73  Pulse: 95 84  Resp:    Temp: 99.9 F (37.7 C) 98.9 F (37.2 C)  SpO2: 97% 96%    General: Pt is alert, awake, not in acute distress Cardiovascular: S1/S2 +, no edema Respiratory: CTA bilaterally, no wheezing, no rhonchi, no respiratory distress, no conversational dyspnea  Abdominal: Soft, NT, ND, bowel sounds + Extremities: no edema, no cyanosis Psych: Normal mood and affect, stable judgement and insight     The results of significant diagnostics from this hospitalization (including imaging, microbiology, ancillary and laboratory) are listed below for reference.     Microbiology: Recent Results (from the past 240 hour(s))  Blood Culture (routine x 2)      Status: Abnormal   Collection Time: 02/22/20 12:33 PM   Specimen: BLOOD RIGHT FOREARM  Result Value Ref Range Status   Specimen Description BLOOD RIGHT FOREARM  Final   Special Requests   Final    BOTTLES DRAWN AEROBIC AND ANAEROBIC Blood Culture results may not be optimal due to an inadequate volume of blood received in culture bottles   Culture  Setup Time   Final    GRAM NEGATIVE RODS IN BOTH AEROBIC AND ANAEROBIC BOTTLES Organism ID to follow CRITICAL RESULT CALLED TO, READ BACK BY AND VERIFIED WITH: PHRMD C AMEND @0620  02/23/20 BY S GEZAHEGN Performed at [facility], [place] [address], [place], [place] [address]    Culture KLEBSIELLA PNEUMONIAE ENTEROBACTER AEROGENES  (A)  Final   Report Status 02/26/2020 FINAL  Final   Organism ID, Bacteria KLEBSIELLA PNEUMONIAE  Final   Organism ID, Bacteria ENTEROBACTER AEROGENES  Final      Susceptibility   Enterobacter aerogenes - MIC*    CEFAZOLIN >=64 RESISTANT Resistant     CEFEPIME <=0.12 SENSITIVE Sensitive     CEFTAZIDIME <=1 SENSITIVE Sensitive     CEFTRIAXONE <=0.25 SENSITIVE Sensitive     CIPROFLOXACIN <=0.25 SENSITIVE Sensitive     GENTAMICIN <=1 SENSITIVE Sensitive     IMIPENEM 1 SENSITIVE Sensitive     TRIMETH/SULFA <=20 SENSITIVE Sensitive  PIP/TAZO <=4 SENSITIVE Sensitive     * ENTEROBACTER AEROGENES   Klebsiella pneumoniae - MIC*    AMPICILLIN RESISTANT Resistant     CEFAZOLIN <=4 SENSITIVE Sensitive     CEFEPIME <=0.12 SENSITIVE Sensitive     CEFTAZIDIME <=1 SENSITIVE Sensitive     CEFTRIAXONE <=0.25 SENSITIVE Sensitive     CIPROFLOXACIN <=0.25 SENSITIVE Sensitive     GENTAMICIN <=1 SENSITIVE Sensitive     IMIPENEM <=0.25 SENSITIVE Sensitive     TRIMETH/SULFA <=20 SENSITIVE Sensitive     AMPICILLIN/SULBACTAM 4 SENSITIVE Sensitive     PIP/TAZO <=4 SENSITIVE Sensitive     * KLEBSIELLA PNEUMONIAE  Urine culture     Status: Abnormal   Collection Time: 02/22/20 12:33 PM   Specimen: In/Out Cath Urine  Result  Value Ref Range Status   Specimen Description IN/OUT CATH URINE  Final   Special Requests   Final    NONE Performed at [facility], [place] [address], [place], [place] [address]    Culture (A)  Final    >=100,000 COLONIES/mL KLEBSIELLA PNEUMONIAE 70,000 COLONIES/mL ENTEROBACTER AEROGENES    Report Status 02/25/2020 FINAL  Final   Organism ID, Bacteria KLEBSIELLA PNEUMONIAE (A)  Final   Organism ID, Bacteria ENTEROBACTER AEROGENES (A)  Final      Susceptibility   Enterobacter aerogenes - MIC*    CEFAZOLIN >=64 RESISTANT Resistant     CEFEPIME <=0.12 SENSITIVE Sensitive     CEFTRIAXONE <=0.25 SENSITIVE Sensitive     CIPROFLOXACIN <=0.25 SENSITIVE Sensitive     GENTAMICIN <=1 SENSITIVE Sensitive     IMIPENEM 2 SENSITIVE Sensitive     NITROFURANTOIN 64 INTERMEDIATE Intermediate     TRIMETH/SULFA <=20 SENSITIVE Sensitive     PIP/TAZO <=4 SENSITIVE Sensitive     * 70,000 COLONIES/mL ENTEROBACTER AEROGENES   Klebsiella pneumoniae - MIC*    AMPICILLIN RESISTANT Resistant     CEFAZOLIN <=4 SENSITIVE Sensitive     CEFTRIAXONE <=0.25 SENSITIVE Sensitive     CIPROFLOXACIN <=0.25 SENSITIVE Sensitive     GENTAMICIN <=1 SENSITIVE Sensitive     IMIPENEM <=0.25 SENSITIVE Sensitive     NITROFURANTOIN <=16 SENSITIVE Sensitive     TRIMETH/SULFA <=20 SENSITIVE Sensitive     AMPICILLIN/SULBACTAM 4 SENSITIVE Sensitive     PIP/TAZO <=4 SENSITIVE Sensitive     * >=100,000 COLONIES/mL KLEBSIELLA PNEUMONIAE  Blood Culture ID Panel (Reflexed)     Status: Abnormal   Collection Time: 02/22/20 12:33 PM  Result Value Ref Range Status   Enterococcus faecalis NOT DETECTED NOT DETECTED Final   Enterococcus Faecium NOT DETECTED NOT DETECTED Final   Listeria monocytogenes NOT DETECTED NOT DETECTED Final   Staphylococcus species NOT DETECTED NOT DETECTED Final   Staphylococcus aureus (BCID) NOT DETECTED NOT DETECTED Final   Staphylococcus epidermidis NOT DETECTED NOT DETECTED Final   Staphylococcus  lugdunensis NOT DETECTED NOT DETECTED Final   Streptococcus species NOT DETECTED NOT DETECTED Final   Streptococcus agalactiae NOT DETECTED NOT DETECTED Final   Streptococcus pneumoniae NOT DETECTED NOT DETECTED Final   Streptococcus pyogenes NOT DETECTED NOT DETECTED Final   A.calcoaceticus-baumannii NOT DETECTED NOT DETECTED Final   Bacteroides fragilis NOT DETECTED NOT DETECTED Final   Enterobacterales DETECTED (A) NOT DETECTED Final    Comment: CRITICAL RESULT CALLED TO, READ BACK BY AND VERIFIED WITH: PHRMD C AMEND @0620  02/23/20 BY S GEZAHEGN    Enterobacter cloacae complex NOT DETECTED NOT DETECTED Final   Escherichia coli NOT DETECTED NOT DETECTED Final   Klebsiella  aerogenes DETECTED (A) NOT DETECTED Final    Comment: CRITICAL RESULT CALLED TO, READ BACK BY AND VERIFIED WITH: PHRMD C AMEND @0620  02/23/20 BY S GEZAHEGN    Klebsiella oxytoca NOT DETECTED NOT DETECTED Final   Klebsiella pneumoniae DETECTED (A) NOT DETECTED Final    Comment: CRITICAL RESULT CALLED TO, READ BACK BY AND VERIFIED WITH: PHRMD C AMEND @0620  02/23/20 BY S GEZAHEGN    Proteus species NOT DETECTED NOT DETECTED Final   Salmonella species NOT DETECTED NOT DETECTED Final   Serratia marcescens NOT DETECTED NOT DETECTED Final   Haemophilus influenzae NOT DETECTED NOT DETECTED Final   Neisseria meningitidis NOT DETECTED NOT DETECTED Final   Pseudomonas aeruginosa NOT DETECTED NOT DETECTED Final   Stenotrophomonas maltophilia NOT DETECTED NOT DETECTED Final   Candida albicans NOT DETECTED NOT DETECTED Final   Candida auris NOT DETECTED NOT DETECTED Final   Candida glabrata NOT DETECTED NOT DETECTED Final   Candida krusei NOT DETECTED NOT DETECTED Final   Candida parapsilosis NOT DETECTED NOT DETECTED Final   Candida tropicalis NOT DETECTED NOT DETECTED Final   Cryptococcus neoformans/gattii NOT DETECTED NOT DETECTED Final   CTX-M ESBL NOT DETECTED NOT DETECTED Final   Carbapenem resistance IMP NOT DETECTED  NOT DETECTED Final   Carbapenem resistance KPC NOT DETECTED NOT DETECTED Final   Carbapenem resistance NDM NOT DETECTED NOT DETECTED Final   Carbapenem resist OXA 48 LIKE NOT DETECTED NOT DETECTED Final   Carbapenem resistance VIM NOT DETECTED NOT DETECTED Final    Comment: Performed at [facility], [address], [place], [place] [address]  SARS Coronavirus 2 by RT PCR (hospital order, performed in [facility] hospital lab) Nasopharyngeal Nasopharyngeal Swab     Status: None   Collection Time: 02/22/20 12:35 PM   Specimen: Nasopharyngeal Swab  Result Value Ref Range Status   SARS Coronavirus 2 NEGATIVE NEGATIVE Final    Comment: (NOTE) SARS-CoV-2 target nucleic acids are NOT DETECTED.  The SARS-CoV-2 RNA is generally detectable in upper and lower respiratory specimens during the acute phase of infection. The lowest concentration of SARS-CoV-2 viral copies this assay can detect is 250 copies / mL. A negative result does not preclude SARS-CoV-2 infection and should not be used as the sole basis for treatment or other patient management decisions.  A negative result may occur with improper specimen collection / handling, submission of specimen other than nasopharyngeal swab, presence of viral mutation(s) within the areas targeted by this assay, and inadequate number of viral copies (<250 copies / mL). A negative result must be combined with clinical observations, patient history, and epidemiological information.  Fact Sheet for Patients:   StrictlyIdeas.no  Fact Sheet for Healthcare Providers: BankingDealers.co.za  This test is not yet approved or  cleared by the [place] FDA and has been authorized for detection and/or diagnosis of SARS-CoV-2 by FDA under an Emergency Use Authorization (EUA).  This EUA will remain in effect (meaning this test can be used) for the duration of the COVID-19 declaration under Section 564(b)(1)  of the Act, 21 U.S.C. section 360bbb-3(b)(1), unless the authorization is terminated or revoked sooner.  Performed at [facility], [place] [address] [place], [place] [address]   Blood Culture (routine x 2)     Status: Abnormal   Collection Time: 02/22/20  1:16 PM   Specimen: BLOOD  Result Value Ref Range Status   Specimen Description BLOOD SITE NOT SPECIFIED  Final   Special Requests   Final  BOTTLES DRAWN AEROBIC AND ANAEROBIC Blood Culture adequate volume   Culture  Setup Time   Final    GRAM NEGATIVE RODS IN BOTH AEROBIC AND ANAEROBIC BOTTLES CRITICAL VALUE NOTED.  VALUE IS CONSISTENT WITH PREVIOUSLY REPORTED AND CALLED VALUE.    Culture (A)  Final    KLEBSIELLA PNEUMONIAE SUSCEPTIBILITIES PERFORMED ON PREVIOUS CULTURE WITHIN THE LAST 5 DAYS. Performed at [facility], [place] [address] [place], [place] [address]    Report Status 02/26/2020 FINAL  Final  MRSA PCR Screening     Status: None   Collection Time: 02/23/20 11:40 AM   Specimen: Nasal Mucosa; Nasopharyngeal  Result Value Ref Range Status   MRSA by PCR NEGATIVE NEGATIVE Final    Comment:        The GeneXpert MRSA Assay (FDA approved for NASAL specimens only), is one component of a comprehensive MRSA colonization surveillance program. It is not intended to diagnose MRSA infection nor to guide or monitor treatment for MRSA infections. Performed at [facility], [place] [address], [place], [place] [address]   Culture, blood (Routine X 2) w Reflex to ID Panel     Status: None (Preliminary result)   Collection Time: 02/24/20 11:27 AM   Specimen: BLOOD LEFT ARM  Result Value Ref Range Status   Specimen Description BLOOD LEFT ARM  Final   Special Requests   Final    BOTTLES DRAWN AEROBIC AND ANAEROBIC Blood Culture adequate volume   Culture   Final    NO GROWTH 3 DAYS Performed at [facility], [place] [address] [place], [place] [address]    Report Status PENDING  Incomplete  Culture, blood  (Routine X 2) w Reflex to ID Panel     Status: None (Preliminary result)   Collection Time: 02/24/20 11:29 AM   Specimen: BLOOD RIGHT HAND  Result Value Ref Range Status   Specimen Description BLOOD RIGHT HAND  Final   Special Requests   Final    BOTTLES DRAWN AEROBIC ONLY Blood Culture results may not be optimal due to an inadequate volume of blood received in culture bottles   Culture   Final    NO GROWTH 3 DAYS Performed at [facility], [place] [address], [place], [place] [address]    Report Status PENDING  Incomplete     Labs: BNP (last 3 results) No results for input(s): BNP in the last 8760 hours. Basic Metabolic Panel: Recent Labs  Lab 02/22/20 1715 02/22/20 1906 02/24/20 0559 02/25/20 0757 02/26/20 0044 02/27/20 0305 02/28/20 0238  NA  --    < > 142 141 142 141 137  K  --    < > 4.0 3.6 3.4* 3.8 3.7  CL  --    < > 107 108 107 107 106  CO2  --    < > 23 26 25 25 25   GLUCOSE  --    < > 102* 103* 102* 106* 127*  BUN  --    < > 56* 33* 27* 17 16  CREATININE  --    < > 1.84* 1.28* 1.27* 1.16 1.05  CALCIUM  --    < > 8.1* 8.1* 8.1* 8.3* 8.1*  MG 1.2*  --  2.0  --   --  1.5* 1.7  PHOS 2.2*  --  2.4*  --   --   --   --    < > = values in this interval not displayed.   Liver  Function Tests: Recent Labs  Lab 02/22/20 1233  AST 44*  ALT 14  ALKPHOS 75  BILITOT 0.9  PROT 6.2*  ALBUMIN 2.8*   No results for input(s): LIPASE, AMYLASE in the last 168 hours. No results for input(s): AMMONIA in the last 168 hours. CBC: Recent Labs  Lab 02/22/20 1315 02/22/20 1906 02/24/20 0559 02/25/20 0757 02/26/20 0044 02/27/20 0305 02/28/20 0238  WBC 9.3   < > 13.4* 12.0* 10.7* 11.3* 11.8*  NEUTROABS 8.5*  --   --   --   --   --   --   HGB 11.9*   < > 9.8* 9.3* 8.7* 8.8* 8.6*  HCT 39.4   < > 30.2* 28.2* 27.5* 28.0* 27.3*  MCV 90.0   < > 85.6 84.9 84.1 85.1 85.0  PLT 168   < > 99* 103* 117* 146* 205   < > = values in this interval not displayed.   Cardiac  Enzymes: Recent Labs  Lab 02/22/20 1715  CKTOTAL 336   BNP: Invalid input(s): POCBNP CBG: Recent Labs  Lab 02/27/20 0751 02/27/20 1149 02/27/20 1731 02/27/20 2059 02/28/20 0750  GLUCAP 91 110* 88 138* 120*   D-Dimer No results for input(s): DDIMER in the last 72 hours. Hgb A1c No results for input(s): HGBA1C in the last 72 hours. Lipid Profile No results for input(s): CHOL, HDL, LDLCALC, TRIG, CHOLHDL, LDLDIRECT in the last 72 hours. Thyroid function studies No results for input(s): TSH, T4TOTAL, T3FREE, THYROIDAB in the last 72 hours.  Invalid input(s): FREET3 Anemia work up No results for input(s): VITAMINB12, FOLATE, FERRITIN, TIBC, IRON, RETICCTPCT in the last 72 hours. Urinalysis    Component Value Date/Time   COLORURINE AMBER (A) 02/22/2020 1233   APPEARANCEUR CLOUDY (A) 02/22/2020 1233   LABSPEC 1.009 02/22/2020 1233   PHURINE 8.0 02/22/2020 1233   GLUCOSEU NEGATIVE 02/22/2020 1233   HGBUR LARGE (A) 02/22/2020 1233   BILIRUBINUR NEGATIVE 02/22/2020 1233   KETONESUR NEGATIVE 02/22/2020 1233   PROTEINUR 100 (A) 02/22/2020 1233   NITRITE NEGATIVE 02/22/2020 1233   LEUKOCYTESUR LARGE (A) 02/22/2020 1233   Sepsis Labs Invalid input(s): PROCALCITONIN,  WBC,  LACTICIDVEN Microbiology Recent Results (from the past 240 hour(s))  Blood Culture (routine x 2)     Status: Abnormal   Collection Time: 02/22/20 12:33 PM   Specimen: BLOOD RIGHT FOREARM  Result Value Ref Range Status   Specimen Description BLOOD RIGHT FOREARM  Final   Special Requests   Final    BOTTLES DRAWN AEROBIC AND ANAEROBIC Blood Culture results may not be optimal due to an inadequate volume of blood received in culture bottles   Culture  Setup Time   Final    GRAM NEGATIVE RODS IN BOTH AEROBIC AND ANAEROBIC BOTTLES Organism ID to follow CRITICAL RESULT CALLED TO, READ BACK BY AND VERIFIED WITH: PHRMD C AMEND @0620  02/23/20 BY S GEZAHEGN Performed at [facility], [place] [address]  [place], [place] [address]    Culture KLEBSIELLA PNEUMONIAE ENTEROBACTER AEROGENES  (A)  Final   Report Status 02/26/2020 FINAL  Final   Organism ID, Bacteria KLEBSIELLA PNEUMONIAE  Final   Organism ID, Bacteria ENTEROBACTER AEROGENES  Final      Susceptibility   Enterobacter aerogenes - MIC*    CEFAZOLIN >=64 RESISTANT Resistant     CEFEPIME <=0.12 SENSITIVE Sensitive     CEFTAZIDIME <=1 SENSITIVE Sensitive     CEFTRIAXONE <=0.25 SENSITIVE Sensitive     CIPROFLOXACIN <=0.25 SENSITIVE Sensitive  GENTAMICIN <=1 SENSITIVE Sensitive     IMIPENEM 1 SENSITIVE Sensitive     TRIMETH/SULFA <=20 SENSITIVE Sensitive     PIP/TAZO <=4 SENSITIVE Sensitive     * ENTEROBACTER AEROGENES   Klebsiella pneumoniae - MIC*    AMPICILLIN RESISTANT Resistant     CEFAZOLIN <=4 SENSITIVE Sensitive     CEFEPIME <=0.12 SENSITIVE Sensitive     CEFTAZIDIME <=1 SENSITIVE Sensitive     CEFTRIAXONE <=0.25 SENSITIVE Sensitive     CIPROFLOXACIN <=0.25 SENSITIVE Sensitive     GENTAMICIN <=1 SENSITIVE Sensitive     IMIPENEM <=0.25 SENSITIVE Sensitive     TRIMETH/SULFA <=20 SENSITIVE Sensitive     AMPICILLIN/SULBACTAM 4 SENSITIVE Sensitive     PIP/TAZO <=4 SENSITIVE Sensitive     * KLEBSIELLA PNEUMONIAE  Urine culture     Status: Abnormal   Collection Time: 02/22/20 12:33 PM   Specimen: In/Out Cath Urine  Result Value Ref Range Status   Specimen Description IN/OUT CATH URINE  Final   Special Requests   Final    NONE Performed at [facility], [place] [address] [place], [place] [address]    Culture (A)  Final    >=100,000 COLONIES/mL KLEBSIELLA PNEUMONIAE 70,000 COLONIES/mL ENTEROBACTER AEROGENES    Report Status 02/25/2020 FINAL  Final   Organism ID, Bacteria KLEBSIELLA PNEUMONIAE (A)  Final   Organism ID, Bacteria ENTEROBACTER AEROGENES (A)  Final      Susceptibility   Enterobacter aerogenes - MIC*    CEFAZOLIN >=64 RESISTANT Resistant     CEFEPIME <=0.12 SENSITIVE Sensitive     CEFTRIAXONE <=0.25  SENSITIVE Sensitive     CIPROFLOXACIN <=0.25 SENSITIVE Sensitive     GENTAMICIN <=1 SENSITIVE Sensitive     IMIPENEM 2 SENSITIVE Sensitive     NITROFURANTOIN 64 INTERMEDIATE Intermediate     TRIMETH/SULFA <=20 SENSITIVE Sensitive     PIP/TAZO <=4 SENSITIVE Sensitive     * 70,000 COLONIES/mL ENTEROBACTER AEROGENES   Klebsiella pneumoniae - MIC*    AMPICILLIN RESISTANT Resistant     CEFAZOLIN <=4 SENSITIVE Sensitive     CEFTRIAXONE <=0.25 SENSITIVE Sensitive     CIPROFLOXACIN <=0.25 SENSITIVE Sensitive     GENTAMICIN <=1 SENSITIVE Sensitive     IMIPENEM <=0.25 SENSITIVE Sensitive     NITROFURANTOIN <=16 SENSITIVE Sensitive     TRIMETH/SULFA <=20 SENSITIVE Sensitive     AMPICILLIN/SULBACTAM 4 SENSITIVE Sensitive     PIP/TAZO <=4 SENSITIVE Sensitive     * >=100,000 COLONIES/mL KLEBSIELLA PNEUMONIAE  Blood Culture ID Panel (Reflexed)     Status: Abnormal   Collection Time: 02/22/20 12:33 PM  Result Value Ref Range Status   Enterococcus faecalis NOT DETECTED NOT DETECTED Final   Enterococcus Faecium NOT DETECTED NOT DETECTED Final   Listeria monocytogenes NOT DETECTED NOT DETECTED Final   Staphylococcus species NOT DETECTED NOT DETECTED Final   Staphylococcus aureus (BCID) NOT DETECTED NOT DETECTED Final   Staphylococcus epidermidis NOT DETECTED NOT DETECTED Final   Staphylococcus lugdunensis NOT DETECTED NOT DETECTED Final   Streptococcus species NOT DETECTED NOT DETECTED Final   Streptococcus agalactiae NOT DETECTED NOT DETECTED Final   Streptococcus pneumoniae NOT DETECTED NOT DETECTED Final   Streptococcus pyogenes NOT DETECTED NOT DETECTED Final   A.calcoaceticus-baumannii NOT DETECTED NOT DETECTED Final   Bacteroides fragilis NOT DETECTED NOT DETECTED Final   Enterobacterales DETECTED (A) NOT DETECTED Final    Comment: CRITICAL RESULT CALLED TO, READ BACK BY AND VERIFIED WITH: PHRMD C AMEND @0620  02/23/20 BY S GEZAHEGN  Enterobacter cloacae complex NOT DETECTED NOT DETECTED  Final   Escherichia coli NOT DETECTED NOT DETECTED Final   Klebsiella aerogenes DETECTED (A) NOT DETECTED Final    Comment: CRITICAL RESULT CALLED TO, READ BACK BY AND VERIFIED WITH: PHRMD C AMEND @0620  02/23/20 BY S GEZAHEGN    Klebsiella oxytoca NOT DETECTED NOT DETECTED Final   Klebsiella pneumoniae DETECTED (A) NOT DETECTED Final    Comment: CRITICAL RESULT CALLED TO, READ BACK BY AND VERIFIED WITH: PHRMD C AMEND @0620  02/23/20 BY S GEZAHEGN    Proteus species NOT DETECTED NOT DETECTED Final   Salmonella species NOT DETECTED NOT DETECTED Final   Serratia marcescens NOT DETECTED NOT DETECTED Final   Haemophilus influenzae NOT DETECTED NOT DETECTED Final   Neisseria meningitidis NOT DETECTED NOT DETECTED Final   Pseudomonas aeruginosa NOT DETECTED NOT DETECTED Final   Stenotrophomonas maltophilia NOT DETECTED NOT DETECTED Final   Candida albicans NOT DETECTED NOT DETECTED Final   Candida auris NOT DETECTED NOT DETECTED Final   Candida glabrata NOT DETECTED NOT DETECTED Final   Candida krusei NOT DETECTED NOT DETECTED Final   Candida parapsilosis NOT DETECTED NOT DETECTED Final   Candida tropicalis NOT DETECTED NOT DETECTED Final   Cryptococcus neoformans/gattii NOT DETECTED NOT DETECTED Final   CTX-M ESBL NOT DETECTED NOT DETECTED Final   Carbapenem resistance IMP NOT DETECTED NOT DETECTED Final   Carbapenem resistance KPC NOT DETECTED NOT DETECTED Final   Carbapenem resistance NDM NOT DETECTED NOT DETECTED Final   Carbapenem resist OXA 48 LIKE NOT DETECTED NOT DETECTED Final   Carbapenem resistance VIM NOT DETECTED NOT DETECTED Final    Comment: Performed at [facility], [address], [place], [place] [address]  SARS Coronavirus 2 by RT PCR (hospital order, performed in [facility] hospital lab) Nasopharyngeal Nasopharyngeal Swab     Status: None   Collection Time: 02/22/20 12:35 PM   Specimen: Nasopharyngeal Swab  Result Value Ref Range Status   SARS Coronavirus 2  NEGATIVE NEGATIVE Final    Comment: (NOTE) SARS-CoV-2 target nucleic acids are NOT DETECTED.  The SARS-CoV-2 RNA is generally detectable in upper and lower respiratory specimens during the acute phase of infection. The lowest concentration of SARS-CoV-2 viral copies this assay can detect is 250 copies / mL. A negative result does not preclude SARS-CoV-2 infection and should not be used as the sole basis for treatment or other patient management decisions.  A negative result may occur with improper specimen collection / handling, submission of specimen other than nasopharyngeal swab, presence of viral mutation(s) within the areas targeted by this assay, and inadequate number of viral copies (<250 copies / mL). A negative result must be combined with clinical observations, patient history, and epidemiological information.  Fact Sheet for Patients:   StrictlyIdeas.no  Fact Sheet for Healthcare Providers: BankingDealers.co.za  This test is not yet approved or  cleared by the [place] FDA and has been authorized for detection and/or diagnosis of SARS-CoV-2 by FDA under an Emergency Use Authorization (EUA).  This EUA will remain in effect (meaning this test can be used) for the duration of the COVID-19 declaration under Section 564(b)(1) of the Act, 21 U.S.C. section 360bbb-3(b)(1), unless the authorization is terminated or revoked sooner.  Performed at [facility], [place] [address], [place], [place] [address]   Blood Culture (routine x 2)     Status: Abnormal   Collection Time: 02/22/20  1:16 PM   Specimen: BLOOD  Result Value Ref  Range Status   Specimen Description BLOOD SITE NOT SPECIFIED  Final   Special Requests   Final    BOTTLES DRAWN AEROBIC AND ANAEROBIC Blood Culture adequate volume   Culture  Setup Time   Final    GRAM NEGATIVE RODS IN BOTH AEROBIC AND ANAEROBIC BOTTLES CRITICAL VALUE NOTED.  VALUE IS CONSISTENT  WITH PREVIOUSLY REPORTED AND CALLED VALUE.    Culture (A)  Final    KLEBSIELLA PNEUMONIAE SUSCEPTIBILITIES PERFORMED ON PREVIOUS CULTURE WITHIN THE LAST 5 DAYS. Performed at [facility], [place] [address] [place], [place] [address]    Report Status 02/26/2020 FINAL  Final  MRSA PCR Screening     Status: None   Collection Time: 02/23/20 11:40 AM   Specimen: Nasal Mucosa; Nasopharyngeal  Result Value Ref Range Status   MRSA by PCR NEGATIVE NEGATIVE Final    Comment:        The GeneXpert MRSA Assay (FDA approved for NASAL specimens only), is one component of a comprehensive MRSA colonization surveillance program. It is not intended to diagnose MRSA infection nor to guide or monitor treatment for MRSA infections. Performed at [facility], [place] [address] [place], [place] [address]   Culture, blood (Routine X 2) w Reflex to ID Panel     Status: None (Preliminary result)   Collection Time: 02/24/20 11:27 AM   Specimen: BLOOD LEFT ARM  Result Value Ref Range Status   Specimen Description BLOOD LEFT ARM  Final   Special Requests   Final    BOTTLES DRAWN AEROBIC AND ANAEROBIC Blood Culture adequate volume   Culture   Final    NO GROWTH 3 DAYS Performed at [facility], [place] [address], [place], [place] [address]    Report Status PENDING  Incomplete  Culture, blood (Routine X 2) w Reflex to ID Panel     Status: None (Preliminary result)   Collection Time: 02/24/20 11:29 AM   Specimen: BLOOD RIGHT HAND  Result Value Ref Range Status   Specimen Description BLOOD RIGHT HAND  Final   Special Requests   Final    BOTTLES DRAWN AEROBIC ONLY Blood Culture results may not be optimal due to an inadequate volume of blood received in culture bottles   Culture   Final    NO GROWTH 3 DAYS Performed at [facility], [place] [address], [place], [place] [address]    Report Status PENDING  Incomplete     Patient was seen and examined on the day of discharge and was found  to be in stable condition. Time coordinating discharge: 25 minutes including assessment and coordination of care, as well as examination of the patient.   SIGNED:  Dessa Phi, DO [facility] 02/28/2020, 8:33 AM

## 2020-02-29 ENCOUNTER — Other Ambulatory Visit (HOSPITAL_COMMUNITY): Payer: Medicare Other

## 2020-02-29 LAB — CULTURE, BLOOD (ROUTINE X 2)
Culture: NO GROWTH
Culture: NO GROWTH
Special Requests: ADEQUATE

## 2020-03-03 ENCOUNTER — Ambulatory Visit (HOSPITAL_BASED_OUTPATIENT_CLINIC_OR_DEPARTMENT_OTHER): Admit: 2020-03-03 | Payer: Medicare Other | Admitting: General Surgery

## 2020-03-03 ENCOUNTER — Encounter (HOSPITAL_BASED_OUTPATIENT_CLINIC_OR_DEPARTMENT_OTHER): Payer: Self-pay

## 2020-03-03 SURGERY — EXCISION LIPOMA
Anesthesia: General | Laterality: Left

## 2020-04-13 ENCOUNTER — Encounter: Payer: Self-pay | Admitting: Internal Medicine

## 2020-04-13 ENCOUNTER — Ambulatory Visit (INDEPENDENT_AMBULATORY_CARE_PROVIDER_SITE_OTHER): Payer: Medicare Other | Admitting: Internal Medicine

## 2020-04-13 ENCOUNTER — Other Ambulatory Visit: Payer: Self-pay

## 2020-04-13 VITALS — BP 120/74 | HR 82 | Temp 96.6°F | Ht 73.0 in | Wt 170.0 lb

## 2020-04-13 DIAGNOSIS — I480 Paroxysmal atrial fibrillation: Secondary | ICD-10-CM | POA: Diagnosis not present

## 2020-04-13 DIAGNOSIS — R310 Gross hematuria: Secondary | ICD-10-CM

## 2020-04-13 DIAGNOSIS — I10 Essential (primary) hypertension: Secondary | ICD-10-CM | POA: Diagnosis not present

## 2020-04-13 NOTE — Progress Notes (Signed)
OFFICE NOTE  Chief Complaint:  Hospital follow-up  Primary Care Physician: [name], MD  HPI:  Nathan [name] is a 74 y.o. 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  He was not followed by cardiologist rather was seen by internal medicine in [place].  He recently moved to the area to be with his daughter.  He also has a history of alcohol abuse in the past which sounded fairly heavy.  Recently was diagnosed with prostate cancer and is considering treatment options.  There is also diabetes and hypertension.  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  His medical providers name was Dr. Sandie Ano at [facility] in [place] - phone # [phone].  He had been maintained on warfarin and reported compliance with the medication however his dose was quite low at only 1 mg daily.  His daughter reports that since she has been taking care of him here in [place] which is for several months he has been alcohol free.  Overall Nathan [name] is without complaints.  He denies any chest pain or worsening shortness of breath.  Recently he was having issues with hypotension and his primary care provider discontinued a number of medications including his beta-blocker.  He remains on lisinopril and nifedipine.  He was previously on Toprol-XL 25 mg daily.  07/10/2019  Nathan [name] returns today for follow-up of his A. fib.  Overall he is doing well now on Eliquis.  He says he does not feel any different on it.  He had no bleeding issues with it.  I switched him to Toprol-XL which he previously taken to help with any possible breakthrough of WPW or A. fib.  I have also taken off of the Procardia.  Blood pressure is well controlled today 133/73.  He is working with both urology and oncology as far as treatment for diagnosed prostate cancer.  He is likely  getting get radiation seed implants.  He is also going to be deferring his lipoma resection.  If necessary at this point he could stop his Eliquis 2 to 3 days prior to that and restarted afterwards when bleeding risk is low.  01/18/2020  Nathan [name] is seen today in follow-up.  He successfully underwent radiation seed implants and is noted per his daughter to have marked decline in his PSA from over 12 down to 3.  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  She says that recently he has been having notable hematuria.  This seems to be somewhat intermittent however said that the urologist wanted to talk with me about managing his Eliquis.  In addition he will likely have to stop the Eliquis for short period of time for the chest wall lipoma.  G shows that he is in sinus rhythm today.  04/13/2020  Nathan [name] is seen today in follow-up.  He was recently hospitalized for a urosepsis picture.  He underwent therapy with radiation seed implants.  During this hospitalization he was in atrial fibrillation with rate control.  Repeat EKG today shows he is back in sinus rhythm.  His daughter noted recently that he had had some recurrent hematuria and he has a leg bag in place today which shows of bloody urine.  Based on this I would advise holding his Eliquis and following up with Dr.  Borden, his urologist.  PMHx:  Past Medical History:  Diagnosis Date  . Atrial fibrillation ([place])   . Cancer of prostate ([place])   . Chronic anticoagulation   . Foley catheter in place   . HTN (hypertension)   . Lipoma of chest wall   . WPW (Wolff-Parkinson-White syndrome)    No prior ablation    Past Surgical History:  Procedure Laterality Date  . CATARACT EXTRACTION      FAMHx:  Family History  Problem Relation Age of Onset  . Lung cancer Mother   . Cervical cancer Sister   . Breast cancer Neg Hx   . Pancreatic cancer Neg Hx   . Colon cancer Neg Hx     SOCHx:   reports that he quit smoking about 14  months ago. His smoking use included cigarettes. He has a 30.00 pack-year smoking history. He has quit using smokeless tobacco. He reports previous alcohol use. He reports that he does not use drugs.  ALLERGIES:  No Known Allergies  ROS: Pertinent items noted in HPI and remainder of comprehensive ROS otherwise negative.  HOME MEDS: Current Outpatient Medications on File Prior to Visit  Medication Sig Dispense Refill  . apixaban (ELIQUIS) 5 MG TABS tablet Take 1 tablet (5 mg total) by mouth 2 (two) times daily. 60 tablet 11  . folic acid (FOLVITE) 1 MG tablet Take 1 tablet by mouth daily.    Marland Kitchen lisinopril (ZESTRIL) 20 MG tablet Take 20 mg by mouth daily.    . magnesium oxide (MAG-OX) 400 MG tablet Take 400 mg by mouth daily.     . metoprolol succinate (TOPROL XL) 25 MG 24 hr tablet Take 1 tablet (25 mg total) by mouth daily. 90 tablet 3  . Prenatal Vit-Fe Fumarate-FA (PRENATAL MULTIVITAMIN) TABS tablet Take 1 tablet by mouth daily at 12 noon.    . vitamin B-12 (CYANOCOBALAMIN) 1000 MCG tablet Take 1,000 mcg by mouth daily.    . Vitamin D, Ergocalciferol, (DRISDOL) 1.25 MG (50000 UNIT) CAPS capsule Take 50,000 Units by mouth every Sunday.      No current facility-administered medications on file prior to visit.    LABS/IMAGING: No results found for this or any previous visit (from the past 48 hour(s)). No results found.  LIPID PANEL: No results found for: CHOL, TRIG, HDL, CHOLHDL, VLDL, LDLCALC, LDLDIRECT   WEIGHTS: Wt Readings from Last 3 Encounters:  04/13/20 170 lb (77.1 kg)  02/22/20 172 lb (78 kg)  01/18/20 177 lb 3.2 oz (80.4 kg)    VITALS: BP 120/74 (BP Location: Left Arm, Patient Position: Sitting, Cuff Size: Normal)   Pulse 87   Temp (!) 96.6 F (35.9 C)   Ht 6\' 1"  (1.854 m)   Wt 170 lb (77.1 kg)   BMI 22.43 kg/m   EXAM: General appearance: alert, no distress and Thin Neck: no carotid bruit, no JVD and thyroid not enlarged, symmetric, no  tenderness/mass/nodules Lungs: clear to auscultation bilaterally Heart: regular rate and rhythm Abdomen: soft, non-tender; bowel sounds normal; no masses,  no organomegaly Extremities: extremities normal, atraumatic, no cyanosis or edema and Chest wall lipoma, leg Foley bag in place with notable gross hematuria Pulses: 2+ and symmetric Skin: Skin color, texture, turgor normal. No rashes or lesions Neurologic: Grossly normal Psych: Pleasant  EKG: Normal sinus rhythm at 82-personally reviewed  ASSESSMENT: 1. Reported history of A. fib with WPW - recent PAF associated with urosepsis 2. Hematuria 3. Anticoagulated on Eliquis 4. History of chronic alcohol abuse-now  abstinent 5. Chest wall lipoma 6. Prostate cancer-with recent hematuria 7. Hypertension 8. Type 2 diabetes-diet controlled, A1c 6.0  PLAN: 1.   Nathan [name] was noted to have gross hematuria in his leg bag today.  He recently had a urosepsis picture and I suspect the Eliquis may be contributing to this.  I advised him to hold it for a week and contact Dr. Alinda Money his urologist to see if he needs to possibly have cystoscopy.  Hopefully he will be able to restart that.  He was noted to be in A. fib during his hospitalization however is back in sinus today which is good.  Ultimately he should be back on anticoagulation if tolerated for stroke risk reduction.  Follow-up with me in 6 months or sooner as necessary.  Pixie Casino, MD, [facility], [place] Director of the Advanced Lipid Disorders &  Cardiovascular Risk Reduction Clinic Diplomate of the American Board of Clinical Lipidology Attending Cardiologist  Direct Dial: [phone]  Fax: [phone]  Website:  [email or web address] Markella Dao 04/13/2020, 3:59 PM

## 2020-04-13 NOTE — Patient Instructions (Signed)
Medication Instructions:  HOLD Eliquis for 1 week and then resume at previous dose  *If you need a refill on your cardiac medications before your next appointment, please call your pharmacy*   Follow-Up: At [facility], you and your health needs are our priority.  As part of our continuing mission to provide you with exceptional heart care, we have created designated Provider Care Teams.  These Care Teams include your primary Cardiologist (physician) and Advanced Practice Providers (APPs -  Physician Assistants and Nurse Practitioners) who all work together to provide you with the care you need, when you need it.  We recommend signing up for the patient portal called "MyChart".  Sign up information is provided on this After Visit Summary.  MyChart is used to connect with patients for Virtual Visits (Telemedicine).  Patients are able to view lab/test results, encounter notes, upcoming appointments, etc.  Non-urgent messages can be sent to your provider as well.   To learn more about what you can do with MyChart, go to [email or web address].    Your next appointment:   6 month(s)  The format for your next appointment:   In Person  Provider:   You may see Pixie Casino, MD or one of the following Advanced Practice Providers on your designated Care Team:    Almyra Deforest, PA-C  Fabian Sharp, PA-C or   Roby Lofts, [place]    Other Instructions  Please contact your UROLOGIST

## 2020-05-04 LAB — COLOGUARD

## 2020-05-10 ENCOUNTER — Telehealth: Payer: Self-pay

## 2020-05-10 NOTE — Progress Notes (Signed)
Spoke with kelly at [facility] and made aware patient needs updated cardaic clearance due to sepsisi since last cardiac clearance note of 01-20-2020.

## 2020-05-10 NOTE — Telephone Encounter (Signed)
   [facility] Pre-operative Risk Assessment    Request for surgical clearance:  1. What type of surgery is being performed? EXCISION OF CHEST WALL LIPOMA    2. When is this surgery scheduled? 05-16-2020  3. What type of clearance is required (medical clearance vs. Pharmacy clearance to hold med vs. Both)? BOTH  4. Are there any medications that need to be held prior to surgery and how long?[id number] DAYS PRIOR LOVENOX BRIDGE??  5. Practice name and name of physician performing surgery? [facility] Assunta Curtis  ATTN:KELLY  6. What is the office phone number? [phone]   7.   What is the office fax number? [phone]  8.   Anesthesia type (None, local, MAC, general) ? GENERAL

## 2020-05-11 ENCOUNTER — Ambulatory Visit: Payer: Self-pay | Admitting: General Surgery

## 2020-05-11 ENCOUNTER — Encounter (HOSPITAL_BASED_OUTPATIENT_CLINIC_OR_DEPARTMENT_OTHER): Payer: Self-pay | Admitting: General Surgery

## 2020-05-11 ENCOUNTER — Other Ambulatory Visit: Payer: Self-pay

## 2020-05-11 NOTE — Telephone Encounter (Signed)
   Primary Cardiologist: Pixie Casino, MD  Chart reviewed as part of pre-operative protocol coverage.   Left a voicemail for patient to call back for ongoing preop assessment.  Abigail Butts, PA-C 05/11/2020, 12:22 PM

## 2020-05-11 NOTE — Progress Notes (Signed)
Spoke w/ via phone for pre-op interview---pt daughter toya Lab needs dos----  I stat 8              Lab results------lov dr Debara Pickett 04-13-2020 epic, ekg 04-13-2020 epic, chest xray 02-24-2020 epic COVID test ------05-12-2020 1000 am Arrive at -------1100 am 05-16-2020 NPO after MN NO Solid Food.  Clear liquids from MN until---100 am then npo Medications to take morning of surgery -----metoprolol succinate, finasteride Diabetic medication -----n/a Patient Special Instructions -----pt getting foley changed 05-16-2020 at [facility] urology per daughter Pre-Op special Istructions -----none Patient verbalized understanding of instructions that were given at this phone interview. Patient denies shortness of breath, chest pain, fever, cough at this phone interview.  Pt daughter toya aware to stop eliquis x 2 days before surgery per kristin alvastad rph note epic 05-11-2020

## 2020-05-11 NOTE — Telephone Encounter (Signed)
Patient with diagnosis of atrial fibrillation on Eliquis for anticoagulation.    Procedure: chest wall lipoma excision Date of procedure:05/16/20    [name]2DS2-VASc Score = 3  This indicates a 3.2% annual risk of stroke. The patient's score is based upon: CHF History: 0 HTN History: 1 Diabetes History: 0 Stroke History: 0 Vascular Disease History: 0 Age Score: 2 Gender Score: 0   CrCl 65 Platelet count 205  Per office protocol, patient can hold Eliquis for 2 days prior to procedure.

## 2020-05-12 ENCOUNTER — Other Ambulatory Visit (HOSPITAL_COMMUNITY)
Admission: RE | Admit: 2020-05-12 | Discharge: 2020-05-12 | Disposition: A | Discharge status: Home / Self Care | Payer: Medicare Other | Source: Ambulatory Visit | Attending: General Surgery | Admitting: General Surgery

## 2020-05-12 DIAGNOSIS — Z01812 Encounter for preprocedural laboratory examination: Secondary | ICD-10-CM | POA: Insufficient documentation

## 2020-05-12 DIAGNOSIS — Z20822 Contact with and (suspected) exposure to covid-19: Secondary | ICD-10-CM | POA: Insufficient documentation

## 2020-05-12 LAB — SARS CORONAVIRUS 2 (TAT 6-24 HRS): SARS Coronavirus 2: NEGATIVE

## 2020-05-13 NOTE — Telephone Encounter (Signed)
   Primary Cardiologist: Pixie Casino, MD  Chart reviewed as part of pre-operative protocol coverage. Given past medical history and time since last visit, based on ACC/AHA guidelines, Nathan [name] would be at acceptable risk for the planned procedure without further cardiovascular testing.   Patient was advised that if he develops new symptoms prior to surgery to contact our office to arrange a follow-up appointment.  He verbalized understanding.  Patient with diagnosis of atrial fibrillation on Eliquis for anticoagulation.    Procedure: chest wall lipoma excision Date of procedure:05/16/20    [name]2DS2-VASc Score = 3  This indicates a 3.2% annual risk of stroke. The patient's score is based upon: CHF History: 0 HTN History: 1 Diabetes History: 0 Stroke History: 0 Vascular Disease History: 0 Age Score: 2 Gender Score: 0   CrCl 65 Platelet count 205  Per office protocol, patient can hold Eliquis for 2 days prior to procedure.     I will route this recommendation to the requesting party via Epic fax function and remove from pre-op pool.  Please call with questions.  Nathan [name]. Nathan Ellsworth NP-C    05/13/2020, 9:19 AM [facility] [address] Office [phone] Fax [phone]

## 2020-05-13 NOTE — Telephone Encounter (Signed)
Patient returning Nathan [name] phone call, advised patient that someone would be calling him shortly. Patient's best contact number is [phone].

## 2020-05-13 NOTE — Progress Notes (Signed)
Pt called cardiology office back today so that could speak him for clearance.  Coletta Memos NP , telephone clearance note 05-14-2011 .  Pt was given clearance to surgery and to stop eliquis 2 days prior to surgery.  Called and spoke w/ pt daughter, [name], via phone and she verbalized understanding of  Instructions to stop eliquis 2 days prior to surgery on 05-16-2020.

## 2020-05-16 ENCOUNTER — Ambulatory Visit (HOSPITAL_BASED_OUTPATIENT_CLINIC_OR_DEPARTMENT_OTHER): Payer: Medicare Other | Admitting: Anesthesiology

## 2020-05-16 ENCOUNTER — Encounter (HOSPITAL_BASED_OUTPATIENT_CLINIC_OR_DEPARTMENT_OTHER): Payer: Self-pay | Admitting: General Surgery

## 2020-05-16 ENCOUNTER — Ambulatory Visit (HOSPITAL_BASED_OUTPATIENT_CLINIC_OR_DEPARTMENT_OTHER)
Admission: RE | Admit: 2020-05-16 | Discharge: 2020-05-16 | Disposition: A | Discharge status: Home / Self Care | Payer: Medicare Other | Attending: General Surgery | Admitting: General Surgery

## 2020-05-16 ENCOUNTER — Encounter (HOSPITAL_BASED_OUTPATIENT_CLINIC_OR_DEPARTMENT_OTHER)
Admission: RE | Disposition: A | Discharge status: Home / Self Care | Payer: Self-pay | Source: Home / Self Care | Attending: General Surgery

## 2020-05-16 ENCOUNTER — Other Ambulatory Visit: Payer: Self-pay

## 2020-05-16 DIAGNOSIS — Z801 Family history of malignant neoplasm of trachea, bronchus and lung: Secondary | ICD-10-CM | POA: Diagnosis not present

## 2020-05-16 DIAGNOSIS — Z87891 Personal history of nicotine dependence: Secondary | ICD-10-CM | POA: Diagnosis not present

## 2020-05-16 DIAGNOSIS — D171 Benign lipomatous neoplasm of skin and subcutaneous tissue of trunk: Secondary | ICD-10-CM | POA: Insufficient documentation

## 2020-05-16 DIAGNOSIS — Z8049 Family history of malignant neoplasm of other genital organs: Secondary | ICD-10-CM | POA: Diagnosis not present

## 2020-05-16 DIAGNOSIS — Z7901 Long term (current) use of anticoagulants: Secondary | ICD-10-CM | POA: Diagnosis not present

## 2020-05-16 DIAGNOSIS — I10 Essential (primary) hypertension: Secondary | ICD-10-CM | POA: Diagnosis not present

## 2020-05-16 DIAGNOSIS — Z79899 Other long term (current) drug therapy: Secondary | ICD-10-CM | POA: Diagnosis not present

## 2020-05-16 DIAGNOSIS — Z8546 Personal history of malignant neoplasm of prostate: Secondary | ICD-10-CM | POA: Insufficient documentation

## 2020-05-16 DIAGNOSIS — I456 Pre-excitation syndrome: Secondary | ICD-10-CM | POA: Insufficient documentation

## 2020-05-16 HISTORY — PX: LIPOMA EXCISION: SHX5283

## 2020-05-16 LAB — POCT I-STAT, CHEM 8
BUN: 10 mg/dL (ref 8–23)
Calcium, Ion: 1.28 mmol/L (ref 1.15–1.40)
Chloride: 104 mmol/L (ref 98–111)
Creatinine, Ser: 1.1 mg/dL (ref 0.61–1.24)
Glucose, Bld: 86 mg/dL (ref 70–99)
HCT: 37 % — ABNORMAL LOW (ref 39.0–52.0)
Hemoglobin: 12.6 g/dL — ABNORMAL LOW (ref 13.0–17.0)
Potassium: 3.8 mmol/L (ref 3.5–5.1)
Sodium: 145 mmol/L (ref 135–145)
TCO2: 26 mmol/L (ref 22–32)

## 2020-05-16 SURGERY — EXCISION LIPOMA
Anesthesia: General | Site: Chest | Laterality: Left

## 2020-05-16 MED ORDER — FENTANYL CITRATE (PF) 100 MCG/2ML IJ SOLN
INTRAMUSCULAR | Status: DC | PRN
  Administered 2020-05-16: 25 ug via INTRAVENOUS
  Administered 2020-05-16: 50 ug via INTRAVENOUS
  Administered 2020-05-16: 25 ug via INTRAVENOUS

## 2020-05-16 MED ORDER — HYDROCODONE-ACETAMINOPHEN 5-325 MG PO TABS: 1.0000 | ORAL_TABLET | Freq: Four times a day (QID) | ORAL | 0 refills | Status: DC | PRN

## 2020-05-16 MED ORDER — CEFAZOLIN SODIUM-DEXTROSE 2-4 GM/100ML-% IV SOLN
INTRAVENOUS | Status: AC
  Filled 2020-05-16: qty 100

## 2020-05-16 MED ORDER — LACTATED RINGERS IV SOLN: INTRAVENOUS | Status: DC

## 2020-05-16 MED ORDER — ONDANSETRON HCL 4 MG/2ML IJ SOLN
INTRAMUSCULAR | Status: AC
  Filled 2020-05-16: qty 2

## 2020-05-16 MED ORDER — FENTANYL CITRATE (PF) 100 MCG/2ML IJ SOLN: 25.0000 ug | INTRAMUSCULAR | Status: DC | PRN

## 2020-05-16 MED ORDER — PROPOFOL 10 MG/ML IV BOLUS
INTRAVENOUS | Status: DC | PRN
  Administered 2020-05-16: 30 mg via INTRAVENOUS
  Administered 2020-05-16: 170 mg via INTRAVENOUS

## 2020-05-16 MED ORDER — ACETAMINOPHEN 500 MG PO TABS
1000.0000 mg | ORAL_TABLET | ORAL | Status: AC
  Administered 2020-05-16: 1000 mg via ORAL

## 2020-05-16 MED ORDER — FENTANYL CITRATE (PF) 100 MCG/2ML IJ SOLN
INTRAMUSCULAR | Status: AC
  Filled 2020-05-16: qty 2

## 2020-05-16 MED ORDER — CHLORHEXIDINE GLUCONATE CLOTH 2 % EX PADS: 6.0000 | MEDICATED_PAD | Freq: Once | CUTANEOUS | Status: DC

## 2020-05-16 MED ORDER — ACETAMINOPHEN 500 MG PO TABS
ORAL_TABLET | ORAL | Status: AC
  Filled 2020-05-16: qty 2

## 2020-05-16 MED ORDER — CEFAZOLIN SODIUM-DEXTROSE 2-4 GM/100ML-% IV SOLN
2.0000 g | INTRAVENOUS | Status: AC
  Administered 2020-05-16: 2 g via INTRAVENOUS

## 2020-05-16 MED ORDER — BUPIVACAINE-EPINEPHRINE 0.5% -1:200000 IJ SOLN
INTRAMUSCULAR | Status: DC | PRN
  Administered 2020-05-16: 26 mL

## 2020-05-16 MED ORDER — ONDANSETRON HCL 4 MG/2ML IJ SOLN
INTRAMUSCULAR | Status: DC | PRN
  Administered 2020-05-16: 4 mg via INTRAVENOUS

## 2020-05-16 MED ORDER — ACETAMINOPHEN 325 MG PO TABS: 325.0000 mg | ORAL_TABLET | ORAL | Status: DC | PRN

## 2020-05-16 MED ORDER — ACETAMINOPHEN 160 MG/5ML PO SOLN: 325.0000 mg | ORAL | Status: DC | PRN

## 2020-05-16 MED ORDER — OXYCODONE HCL 5 MG PO TABS: 5.0000 mg | ORAL_TABLET | Freq: Once | ORAL | Status: DC | PRN

## 2020-05-16 MED ORDER — DEXAMETHASONE SODIUM PHOSPHATE 10 MG/ML IJ SOLN
INTRAMUSCULAR | Status: AC
  Filled 2020-05-16: qty 1

## 2020-05-16 MED ORDER — DEXAMETHASONE SODIUM PHOSPHATE 10 MG/ML IJ SOLN
INTRAMUSCULAR | Status: DC | PRN
  Administered 2020-05-16: 5 mg via INTRAVENOUS

## 2020-05-16 MED ORDER — ONDANSETRON HCL 4 MG/2ML IJ SOLN: 4.0000 mg | Freq: Once | INTRAMUSCULAR | Status: DC | PRN

## 2020-05-16 MED ORDER — LIDOCAINE 2% (20 MG/ML) 5 ML SYRINGE
INTRAMUSCULAR | Status: AC
  Filled 2020-05-16: qty 5

## 2020-05-16 MED ORDER — MEPERIDINE HCL 25 MG/ML IJ SOLN: 6.2500 mg | INTRAMUSCULAR | Status: DC | PRN

## 2020-05-16 MED ORDER — OXYCODONE HCL 5 MG/5ML PO SOLN: 5.0000 mg | Freq: Once | ORAL | Status: DC | PRN

## 2020-05-16 MED ORDER — LIDOCAINE 2% (20 MG/ML) 5 ML SYRINGE
INTRAMUSCULAR | Status: DC | PRN
  Administered 2020-05-16: 60 mg via INTRAVENOUS

## 2020-05-16 SURGICAL SUPPLY — 53 items
APL PRP STRL LF DISP 70% ISPRP (MISCELLANEOUS) ×1
APL SKNCLS STERI-STRIP NONHPOA (GAUZE/BANDAGES/DRESSINGS) ×1
BENZOIN TINCTURE PRP APPL 2/3 (GAUZE/BANDAGES/DRESSINGS) ×2 IMPLANT
BLADE CLIPPER SENSICLIP SURGIC (BLADE) IMPLANT
BLADE HEX COATED 2.75 (ELECTRODE) ×2 IMPLANT
BLADE SURG 15 STRL LF DISP TIS (BLADE) ×1 IMPLANT
BLADE SURG 15 STRL SS (BLADE) ×2
BNDG GAUZE ELAST 4 BULKY (GAUZE/BANDAGES/DRESSINGS) IMPLANT
CHLORAPREP W/TINT 26 (MISCELLANEOUS) ×2 IMPLANT
COVER BACK TABLE 60X90IN (DRAPES) ×2 IMPLANT
COVER MAYO STAND STRL (DRAPES) ×2 IMPLANT
COVER WAND RF STERILE (DRAPES) ×2 IMPLANT
DECANTER SPIKE VIAL GLASS SM (MISCELLANEOUS) IMPLANT
DRAIN PENROSE 0.25X18 (DRAIN) IMPLANT
DRAIN PENROSE 0.5X18 (DRAIN) IMPLANT
DRAPE LAPAROTOMY 100X72 PEDS (DRAPES) ×2 IMPLANT
DRAPE UTILITY XL STRL (DRAPES) ×2 IMPLANT
DRSG PAD ABDOMINAL 8X10 ST (GAUZE/BANDAGES/DRESSINGS) ×2 IMPLANT
DRSG TEGADERM 4X4.75 (GAUZE/BANDAGES/DRESSINGS) ×10 IMPLANT
ELECT COATED BLADE 2.86 ST (ELECTRODE) IMPLANT
ELECT REM PT RETURN 9FT ADLT (ELECTROSURGICAL) ×2
ELECTRODE REM PT RTRN 9FT ADLT (ELECTROSURGICAL) ×1 IMPLANT
GAUZE SPONGE 4X4 12PLY STRL (GAUZE/BANDAGES/DRESSINGS) IMPLANT
GLOVE BIOGEL PI IND STRL 7.0 (GLOVE) ×1 IMPLANT
GLOVE BIOGEL PI INDICATOR 7.0 (GLOVE) ×1
GLOVE SURG SS PI 7.0 STRL IVOR (GLOVE) ×2 IMPLANT
GOWN STRL REUS W/TWL LRG LVL3 (GOWN DISPOSABLE) ×2 IMPLANT
KIT TURNOVER CYSTO (KITS) ×2 IMPLANT
NEEDLE HYPO 25X1 1.5 SAFETY (NEEDLE) ×2 IMPLANT
NS IRRIG 500ML POUR BTL (IV SOLUTION) IMPLANT
PACK BASIN DAY SURGERY FS (CUSTOM PROCEDURE TRAY) ×2 IMPLANT
PENCIL SMOKE EVACUATOR (MISCELLANEOUS) ×2 IMPLANT
SPONGE LAP 4X18 RFD (DISPOSABLE) ×2 IMPLANT
STRIP CLOSURE SKIN 1/2X4 (GAUZE/BANDAGES/DRESSINGS) ×2 IMPLANT
SUCTION FRAZIER HANDLE 10FR (MISCELLANEOUS)
SUCTION TUBE FRAZIER 10FR DISP (MISCELLANEOUS) IMPLANT
SUT ETHILON 3 0 FSL (SUTURE) ×2 IMPLANT
SUT MNCRL AB 4-0 PS2 18 (SUTURE) ×2 IMPLANT
SUT SILK 3 0 TIES 17X18 (SUTURE)
SUT SILK 3-0 18XBRD TIE BLK (SUTURE) IMPLANT
SUT VIC AB 2-0 SH 27 (SUTURE)
SUT VIC AB 2-0 SH 27XBRD (SUTURE) IMPLANT
SUT VIC AB 3-0 SH 27 (SUTURE)
SUT VIC AB 3-0 SH 27X BRD (SUTURE) IMPLANT
SUT VIC AB 3-0 SH 8-18 (SUTURE) IMPLANT
SUT VICRYL 3-0 CR8 SH (SUTURE) ×4 IMPLANT
SWAB COLLECTION DEVICE MRSA (MISCELLANEOUS) IMPLANT
SWAB CULTURE ESWAB REG 1ML (MISCELLANEOUS) IMPLANT
SYR BULB IRRIG 60ML STRL (SYRINGE) IMPLANT
SYR CONTROL 10ML LL (SYRINGE) ×2 IMPLANT
TOWEL OR 17X26 10 PK STRL BLUE (TOWEL DISPOSABLE) ×2 IMPLANT
TUBE CONNECTING 12X1/4 (SUCTIONS) ×2 IMPLANT
YANKAUER SUCT BULB TIP NO VENT (SUCTIONS) IMPLANT

## 2020-05-16 NOTE — Anesthesia Procedure Notes (Signed)
Procedure Name: LMA Insertion Date/Time: 05/16/2020 10:54 AM Performed by: Lollie Sails, CRNA Pre-anesthesia Checklist: Patient identified, Emergency Drugs available, Suction available, Patient being monitored and Timeout performed Patient Re-evaluated:Patient Re-evaluated prior to induction Oxygen Delivery Method: Circle system utilized Preoxygenation: Pre-oxygenation with 100% oxygen Induction Type: IV induction Ventilation: Mask ventilation without difficulty LMA: LMA inserted LMA Size: 5.0 Number of attempts: 1 Placement Confirmation: positive ETCO2 and breath sounds checked- equal and bilateral Tube secured with: Tape Dental Injury: Teeth and Oropharynx as per pre-operative assessment

## 2020-05-16 NOTE — Discharge Instructions (Signed)
Call your surgeon if you experience:  ° °1.  Fever over 101.0. °2.  Inability to urinate. °3.  Nausea and/or vomiting. °4.  Extreme swelling or bruising at the surgical site. °5.  Continued bleeding from the incision. °6.  Increased pain, redness or drainage from the incision. °7.  Problems related to your pain medication. °8.  Any problems and/or concerns °Post Anesthesia Home Care Instructions ° °Activity: °Get plenty of rest for the remainder of the day. A responsible individual must stay with you for 24 hours following the procedure.  °For the next 24 hours, DO NOT: °-Drive a car °-Operate machinery °-Drink alcoholic beverages °-Take any medication unless instructed by your physician °-Make any legal decisions or sign important papers. ° °Meals: °Start with liquid foods such as gelatin or soup. Progress to regular foods as tolerated. Avoid greasy, spicy, heavy foods. If nausea and/or vomiting occur, drink only clear liquids until the nausea and/or vomiting subsides. Call your physician if vomiting continues. ° °Special Instructions/Symptoms: °Your throat may feel dry or sore from the anesthesia or the breathing tube placed in your throat during surgery. If this causes discomfort, gargle with warm salt water. The discomfort should disappear within 24 hours. ° °If you had a scopolamine patch placed behind your ear for the management of post- operative nausea and/or vomiting: ° °1. The medication in the patch is effective for 72 hours, after which it should be removed.  Wrap patch in a tissue and discard in the trash. Wash hands thoroughly with soap and water. °2. You may remove the patch earlier than 72 hours if you experience unpleasant side effects which may include dry mouth, dizziness or visual disturbances. °3. Avoid touching the patch. Wash your hands with soap and water after contact with the patch. °  ° °

## 2020-05-16 NOTE — Op Note (Signed)
Preoperative diagnosis: chest wall mass  Postoperative diagnosis: same   Procedure: excision of left chest wall mass that was 14 x 19 cm and involved fascia of pectoralis muscle  Surgeon: Gurney Maxin, M.D.  Asst: noen  Anesthesia: general  Indications for procedure: Nathan [name] is a 74 y.o. year old male with symptoms of enlarging mass of left chest wall.  Description of procedure: The patient was brought into the operative suite. Anesthesia was administered with General LMA anesthesia. WHO checklist was applied. The patient was then placed in supine position. The area was prepped and draped in the usual sterile fashion.  Next, marcaine was infused over the area. A transverse incision was made through the skin. Cautery was used to dissect through the subcutaneous tissue. Blunt dissection and cautery was used to dissect the mass free of surrounding attachments. The mass was well encapsulated and the capsule was entered in one area laterally during dissection. The mass did not involve the areolar complex or any area underlying that area. The superior medial aspect capsule involved the fascia of the pectoralis and the pectoralis muscle fibers were bluntly dissected off the capsule. The mass was removed in its entirety. The wound was irrigated. Hemostasis was applied with cautery. Additional local was applied to the muscle bed and subcutaneous tissue. Multiple interrupted 3-0 vicryls were used to abut the skin to the pectoralis fascia to avoid seroma/hematoma formation. The incision was closed with 3-0 vicryl in interrupted fashion and the skin was closed with 4-0 monocryl in running subcuticular stitch. Steristrips and dressing were put in place. The patient awoke from anesthesia and brought to pacu in stable condition. All counts were correct. The patient tolerated the procedure well.   Findings: 19 x 14 cm fatty mass  Specimen: lipoma of chest wall, short marks anterior, long marks  superior, double stitch marks lateral  Implant: none   Blood loss: 10 ml  Local anesthesia: 30 ml marcaine   Complications: none      Gurney Maxin, M.D. General, Bariatric, & Minimally Invasive Surgery [facility]

## 2020-05-16 NOTE — Transfer of Care (Signed)
Immediate Anesthesia Transfer of Care Note  Patient: Nathan [name]  Procedure(s) Performed: EXCISION LIPOMA LEFT CHEST WALL (Left Chest)  Patient Location: PACU  Anesthesia Type:General  Level of Consciousness: awake, drowsy and responds to stimulation  Airway & Oxygen Therapy: Patient Spontanous Breathing and Patient connected to nasal cannula oxygen  Post-op Assessment: Report given to RN and Post -op Vital signs reviewed and stable  Post vital signs: Reviewed and stable  Last Vitals:  Vitals Value Taken Time  BP    Temp    Pulse 81 05/16/20 1202  Resp 19 05/16/20 1202  SpO2 97 % 05/16/20 1202  Vitals shown include unvalidated device data.  Last Pain:  Vitals:   05/16/20 1025  TempSrc: Oral  PainSc: 0-No pain      Patients Stated Pain Goal: 5 (37/50/51 0712)  Complications: No complications documented.

## 2020-05-16 NOTE — H&P (Signed)
Nathan [name] is an 74 y.o. male.   Chief Complaint: left chest mass HPI: 74 yo male with enlarging mass over his left upper chest wall. He denies pain but it can be uncomfortable and he does not like the appearance.  Past Medical History:  Diagnosis Date  . Atrial fibrillation ([place]) 01/2020   resolved now   . Cancer of prostate ([place]) 2020  . Chronic anticoagulation   . Foley catheter in place    will be changed 05-16-20  . HTN (hypertension)   . Lipoma of chest wall   . Pneumonia 01/2020   and uti in hospital for 1 week  . WPW (Wolff-Parkinson-White syndrome)    No prior ablation    Past Surgical History:  Procedure Laterality Date  . CATARACT EXTRACTION Bilateral 2020  . RADIOACTIVE SEED IMPLANT  2020    Family History  Problem Relation Age of Onset  . Lung cancer Mother   . Cervical cancer Sister   . Breast cancer Neg Hx   . Pancreatic cancer Neg Hx   . Colon cancer Neg Hx    Social History:  reports that he quit smoking about 15 months ago. His smoking use included cigarettes. He has a 30.00 pack-year smoking history. He has quit using smokeless tobacco. He reports previous alcohol use. He reports that he does not use drugs.  Allergies: No Known Allergies  Medications Prior to Admission  Medication Sig Dispense Refill  . apixaban (ELIQUIS) 5 MG TABS tablet Take 1 tablet (5 mg total) by mouth 2 (two) times daily. 60 tablet 11  . finasteride (PROSCAR) 5 MG tablet Take 5 mg by mouth daily.    . folic acid (FOLVITE) 1 MG tablet Take 1 tablet by mouth daily.    Marland Kitchen lisinopril (ZESTRIL) 20 MG tablet Take 20 mg by mouth daily.    . magnesium oxide (MAG-OX) 400 MG tablet Take 400 mg by mouth daily.     . metoprolol succinate (TOPROL XL) 25 MG 24 hr tablet Take 1 tablet (25 mg total) by mouth daily. 90 tablet 3  . Prenatal Vit-Fe Fumarate-FA (PRENATAL MULTIVITAMIN) TABS tablet Take 1 tablet by mouth daily at 12 noon.    . vitamin B-12 (CYANOCOBALAMIN) 1000 MCG tablet Take  1,000 mcg by mouth daily.    . Vitamin D, Ergocalciferol, (DRISDOL) 1.25 MG (50000 UNIT) CAPS capsule Take 50,000 Units by mouth every Sunday.       Results for orders placed or performed during the hospital encounter of 05/16/20 (from the past 48 hour(s))  I-STAT, chem 8     Status: Abnormal   Collection Time: 05/16/20 10:23 AM  Result Value Ref Range   Sodium 145 135 - 145 mmol/L   Potassium 3.8 3.5 - 5.1 mmol/L   Chloride 104 98 - 111 mmol/L   BUN 10 8 - 23 mg/dL   Creatinine, Ser 1.10 0.61 - 1.24 mg/dL   Glucose, Bld 86 70 - 99 mg/dL    Comment: Glucose reference range applies only to samples taken after fasting for at least 8 hours.   Calcium, Ion 1.28 1.15 - 1.40 mmol/L   TCO2 26 22 - 32 mmol/L   Hemoglobin 12.6 (L) 13.0 - 17.0 g/dL   HCT 37.0 (L) 39 - 52 %   No results found.  Review of Systems  Constitutional: Negative for chills and fever.  HENT: Negative for hearing loss.   Respiratory: Negative for cough.   Cardiovascular: Negative for chest pain and palpitations.  Gastrointestinal: Negative for abdominal pain, nausea and vomiting.  Genitourinary: Negative for dysuria and urgency.  Musculoskeletal: Negative for myalgias and neck pain.  Skin: Negative for rash.  Neurological: Negative for dizziness and headaches.  Hematological: Does not bruise/bleed easily.  Psychiatric/Behavioral: Negative for suicidal ideas.    Blood pressure (!) 161/80, pulse 69, temperature 97.9 F (36.6 C), temperature source Oral, resp. rate 15, height 6\' 1"  (1.854 m), weight 80.2 kg, SpO2 100 %. Physical Exam Vitals reviewed.  Constitutional:      Appearance: He is well-developed.  HENT:     Head: Normocephalic and atraumatic.  Eyes:     Conjunctiva/sclera: Conjunctivae normal.     Pupils: Pupils are equal, round, and reactive to light.  Cardiovascular:     Rate and Rhythm: Normal rate and regular rhythm.  Pulmonary:     Effort: Pulmonary effort is normal.     Breath sounds: Normal  breath sounds.  Abdominal:     General: Bowel sounds are normal. There is no distension.     Palpations: Abdomen is soft.     Tenderness: There is no abdominal tenderness.  Musculoskeletal:        General: Normal range of motion.     Cervical back: Normal range of motion and neck supple.  Skin:    General: Skin is warm and dry.     Comments: Large left chest wall mobile mass, no punctate opening, 15 x 20 cm in size  Neurological:     Mental Status: He is alert and oriented to person, place, and time.  Psychiatric:        Behavior: Behavior normal.      Assessment/Plan 74 yo male with large chest lipoma -excision of large chest wall mass -planned outpatient procedure  Mickeal Skinner, MD 05/16/2020, 10:35 AM

## 2020-05-16 NOTE — Anesthesia Preprocedure Evaluation (Addendum)
Anesthesia Evaluation  Patient identified by MRN, date of birth, ID band Patient awake    Reviewed: Allergy & Precautions, H&P , NPO status , Patient's Chart, lab work & pertinent test results, reviewed documented beta blocker date and time   Airway Mallampati: I  TM Distance: >3 FB Neck ROM: full    Dental no notable dental hx. (+) Edentulous Upper, Poor Dentition, Chipped, Missing,    Pulmonary neg pulmonary ROS, former smoker,    Pulmonary exam normal breath sounds clear to auscultation       Cardiovascular Exercise Tolerance: Good hypertension, Pt. on home beta blockers and Pt. on medications negative cardio ROS  + dysrhythmias Atrial Fibrillation  Rhythm:regular Rate:Normal     Neuro/Psych negative neurological ROS  negative psych ROS   GI/Hepatic negative GI ROS, Neg liver ROS,   Endo/Other  negative endocrine ROS  Renal/GU negative Renal ROS  negative genitourinary   Musculoskeletal   Abdominal   Peds  Hematology negative hematology ROS (+)   Anesthesia Other Findings   Reproductive/Obstetrics negative OB ROS                            Anesthesia Physical Anesthesia Plan  ASA: III  Anesthesia Plan: General   Post-op Pain Management:    Induction: Intravenous  PONV Risk Score and Plan: 2 and Ondansetron, Dexamethasone and Treatment may vary due to age or medical condition  Airway Management Planned: LMA  Additional Equipment:   Intra-op Plan:   Post-operative Plan:   Informed Consent: I have reviewed the patients History and Physical, chart, labs and discussed the procedure including the risks, benefits and alternatives for the proposed anesthesia with the patient or authorized representative who has indicated his/her understanding and acceptance.     Dental Advisory Given  Plan Discussed with: CRNA and Anesthesiologist  Anesthesia Plan Comments: ( )         Anesthesia Quick Evaluation

## 2020-05-17 ENCOUNTER — Encounter (HOSPITAL_BASED_OUTPATIENT_CLINIC_OR_DEPARTMENT_OTHER): Payer: Self-pay | Admitting: General Surgery

## 2020-05-17 NOTE — Anesthesia Postprocedure Evaluation (Signed)
Anesthesia Post Note  Patient: Nathan [name]  Procedure(s) Performed: EXCISION LIPOMA LEFT CHEST WALL (Left Chest)     Patient location during evaluation: PACU Anesthesia Type: General Level of consciousness: awake and alert Pain management: pain level controlled Vital Signs Assessment: post-procedure vital signs reviewed and stable Respiratory status: spontaneous breathing, nonlabored ventilation, respiratory function stable and patient connected to nasal cannula oxygen Cardiovascular status: blood pressure returned to baseline and stable Postop Assessment: no apparent nausea or vomiting Anesthetic complications: no   No complications documented.  Last Vitals:  Vitals:   05/16/20 1230 05/16/20 1324  BP: (!) 159/90 (!) 169/66  Pulse: 65 (!) 55  Resp: 12 16  Temp:  36.5 C  SpO2: 98% 99%    Last Pain:  Vitals:   05/17/20 0954  TempSrc:   PainSc: 0-No pain                 Gurnie Duris

## 2020-05-20 LAB — SURGICAL PATHOLOGY

## 2020-05-22 LAB — COLOGUARD: COLOGUARD: NEGATIVE

## 2020-06-14 ENCOUNTER — Other Ambulatory Visit: Payer: Self-pay | Admitting: Internal Medicine

## 2020-06-14 NOTE — Telephone Encounter (Signed)
Prescription refill request for Eliquis received. Indication: atrial fibrillation Last office visit: 04/2020  hilty Scr: 1.10  05/2020 Age: 74 Weight:80.2 kg  Prescription refilled

## 2020-07-07 ENCOUNTER — Encounter (HOSPITAL_COMMUNITY): Payer: Self-pay

## 2020-07-07 ENCOUNTER — Other Ambulatory Visit: Payer: Self-pay

## 2020-07-07 ENCOUNTER — Emergency Department (HOSPITAL_COMMUNITY)
Admission: EM | Admit: 2020-07-07 | Discharge: 2020-07-07 | Disposition: A | Discharge status: Home / Self Care | Payer: Medicare Other | Attending: Emergency Medicine | Admitting: Emergency Medicine

## 2020-07-07 DIAGNOSIS — Z87891 Personal history of nicotine dependence: Secondary | ICD-10-CM | POA: Insufficient documentation

## 2020-07-07 DIAGNOSIS — N390 Urinary tract infection, site not specified: Secondary | ICD-10-CM | POA: Insufficient documentation

## 2020-07-07 DIAGNOSIS — T839XXA Unspecified complication of genitourinary prosthetic device, implant and graft, initial encounter: Secondary | ICD-10-CM

## 2020-07-07 DIAGNOSIS — Z79899 Other long term (current) drug therapy: Secondary | ICD-10-CM | POA: Insufficient documentation

## 2020-07-07 DIAGNOSIS — Z8546 Personal history of malignant neoplasm of prostate: Secondary | ICD-10-CM | POA: Insufficient documentation

## 2020-07-07 DIAGNOSIS — I10 Essential (primary) hypertension: Secondary | ICD-10-CM | POA: Diagnosis not present

## 2020-07-07 DIAGNOSIS — Z7901 Long term (current) use of anticoagulants: Secondary | ICD-10-CM | POA: Insufficient documentation

## 2020-07-07 DIAGNOSIS — T83098A Other mechanical complication of other indwelling urethral catheter, initial encounter: Secondary | ICD-10-CM | POA: Diagnosis present

## 2020-07-07 LAB — BASIC METABOLIC PANEL
Anion gap: 12 (ref 5–15)
BUN: 15 mg/dL (ref 8–23)
CO2: 28 mmol/L (ref 22–32)
Calcium: 9.7 mg/dL (ref 8.9–10.3)
Chloride: 102 mmol/L (ref 98–111)
Creatinine, Ser: 1.07 mg/dL (ref 0.61–1.24)
GFR, Estimated: >60 mL/min (ref >60)
Glucose, Bld: 130 mg/dL — ABNORMAL HIGH (ref 70–99)
Potassium: 3.8 mmol/L (ref 3.5–5.1)
Sodium: 142 mmol/L (ref 135–145)

## 2020-07-07 LAB — URINALYSIS, ROUTINE W REFLEX MICROSCOPIC
Bilirubin Urine: NEGATIVE
Glucose, UA: NEGATIVE mg/dL
Ketones, ur: NEGATIVE mg/dL
Nitrite: NEGATIVE
Protein, ur: 100 mg/dL — AB
RBC / HPF: >50 RBC/hpf — ABNORMAL HIGH (ref 0–5)
Specific Gravity, Urine: 1.01 (ref 1.005–1.030)
WBC, UA: >50 WBC/hpf — ABNORMAL HIGH (ref 0–5)
pH: 7 (ref 5.0–8.0)

## 2020-07-07 LAB — CBC WITH DIFFERENTIAL/PLATELET
Abs Immature Granulocytes: 0.03 10*3/uL (ref 0.00–0.07)
Basophils Absolute: 0 10*3/uL (ref 0.0–0.1)
Basophils Relative: 0 %
Eosinophils Absolute: 0 10*3/uL (ref 0.0–0.5)
Eosinophils Relative: 0 %
HCT: 42.3 % (ref 39.0–52.0)
Hemoglobin: 13.5 g/dL (ref 13.0–17.0)
Immature Granulocytes: 0 %
Lymphocytes Relative: 8 %
Lymphs Abs: 0.8 10*3/uL (ref 0.7–4.0)
MCH: 27.6 pg (ref 26.0–34.0)
MCHC: 31.9 g/dL (ref 30.0–36.0)
MCV: 86.3 fL (ref 80.0–100.0)
Monocytes Absolute: 0.7 10*3/uL (ref 0.1–1.0)
Monocytes Relative: 7 %
Neutro Abs: 8.4 10*3/uL — ABNORMAL HIGH (ref 1.7–7.7)
Neutrophils Relative %: 85 %
Platelets: 312 10*3/uL (ref 150–400)
RBC: 4.9 MIL/uL (ref 4.22–5.81)
RDW: 13.2 % (ref 11.5–15.5)
WBC: 9.9 10*3/uL (ref 4.0–10.5)
nRBC: 0 % (ref 0.0–0.2)

## 2020-07-07 MED ORDER — CEPHALEXIN 500 MG PO CAPS: 500.0000 mg | ORAL_CAPSULE | Freq: Four times a day (QID) | ORAL | 0 refills | Status: AC

## 2020-07-07 MED ORDER — TRANEXAMIC ACID FOR EPISTAXIS
500.0000 mg | Freq: Once | TOPICAL | Status: AC
  Administered 2020-07-07: 500 mg via TOPICAL
  Filled 2020-07-07: qty 10

## 2020-07-07 MED ORDER — SODIUM CHLORIDE 0.9 % IV SOLN
1.0000 g | Freq: Once | INTRAVENOUS | Status: AC
  Administered 2020-07-07: 1 g via INTRAVENOUS
  Filled 2020-07-07: qty 10

## 2020-07-07 NOTE — ED Triage Notes (Signed)
Pt reports attempting to change foley catheter at home. Re-insertion was unsuccessful. Suprapubic pain. Pt urinated prior to triage but sts he still feels a lot of pressure.

## 2020-07-07 NOTE — ED Provider Notes (Signed)
[facility]-EMERGENCY DEPT Provider Note   CSN: [id number] Arrival date & time: 07/07/20  1930     History Chief Complaint  Patient presents with  . Urinary Retention    Nathan [name] is a 75 y.o. male.   Patient reports he ED after waking this morning with abdominal cramps in the suprapubic region.  He reports that his disorder was concerned that he had an obstruction try to exchange his urinary catheter and was not able to replace it successfully.  He reports that he has been having cramping in the suprapubic region all day.  Upon my entrance into the room, patient had one episode of emesis.  He denies any fever, chills, difficulty with breathing, hematuria or hematochezia/melena.  Patient stable, and she began to have bleeding from his urethra. Patient is undergoing treatment for prostate cancer. Patient is unsure of his urinary catheter sizing says that his daughter does any chills.  He states that his prior catheter was in about 10 days prior to today.        Past Medical History:  Diagnosis Date  . Atrial fibrillation (HCC) 01/2020   resolved now   . Cancer of prostate (HCC) 2020  . Chronic anticoagulation   . Foley catheter in place    will be changed 05-16-20  . HTN (hypertension)   . Lipoma of chest wall   . Pneumonia 01/2020   and uti in hospital for 1 week  . WPW (Wolff-Parkinson-White syndrome)    No prior ablation    Patient Active Problem List   Diagnosis Date Noted  . Septic shock (HCC) 02/22/2020  . Malignant neoplasm of prostate (HCC) 07/07/2019  . Atrial fibrillation (HCC) 06/05/2019  . Long term (current) use of anticoagulants 06/05/2019    Past Surgical History:  Procedure Laterality Date  . CATARACT EXTRACTION Bilateral 2020  . LIPOMA EXCISION Left 05/16/2020   Procedure: EXCISION LIPOMA LEFT CHEST WALL;  Surgeon: Kinsinger, De Blanch, MD;  Location: [facility];  Service: General;  Laterality: Left;  .  RADIOACTIVE SEED IMPLANT  2020       Family History  Problem Relation Age of Onset  . Lung cancer Mother   . Cervical cancer Sister   . Breast cancer Neg Hx   . Pancreatic cancer Neg Hx   . Colon cancer Neg Hx     Social History   Tobacco Use  . Smoking status: Former Smoker    Packs/day: 0.50    Years: 60.00    Pack years: 30.00    Types: Cigarettes    Quit date: 01/31/2019    Years since quitting: 1.4  . Smokeless tobacco: Former Clinical biochemist  . Vaping Use: Never used  Substance Use Topics  . Alcohol use: Not Currently  . Drug use: Never    Home Medications Prior to Admission medications   Medication Sig Start Date End Date Taking? Authorizing Provider  cephALEXin (KEFLEX) 500 MG capsule Take 1 capsule (500 mg total) by mouth 4 (four) times daily for 6 days. 07/07/20 07/13/20 Yes Simmons-Robinson, Jaquia Benedicto, MD  ELIQUIS 5 MG TABS tablet Take 1 tablet by mouth twice daily Patient taking differently: Take 5 mg by mouth 2 (two) times daily. 06/14/20  Yes Hilty, Lisette Abu, MD  finasteride (PROSCAR) 5 MG tablet Take 5 mg by mouth daily.   Yes [provider]  folic acid (FOLVITE) 1 MG tablet Take 1 tablet by mouth daily. 03/24/19  Yes [provider]  HYDROcodone-acetaminophen (NORCO/VICODIN) 5-325 MG tablet Take 1 tablet by mouth every 6 (six) hours as needed for moderate pain. 05/16/20  Yes Kinsinger, Arta Bruce, MD  lisinopril (ZESTRIL) 20 MG tablet Take 20 mg by mouth daily.   Yes [provider]  magnesium oxide (MAG-OX) 400 MG tablet Take 400 mg by mouth daily.  03/24/19  Yes [provider]  metoprolol succinate (TOPROL-XL) 25 MG 24 hr tablet Take 1 tablet by mouth once daily Patient taking differently: Take 25 mg by mouth daily. 06/14/20  Yes Hilty, Nadean Corwin, MD  oxybutynin (DITROPAN-XL) 10 MG 24 hr tablet Take 10 mg by mouth daily. 07/07/20  Yes [provider]  Prenatal Vit-Fe Fumarate-FA (PRENATAL MULTIVITAMIN) TABS tablet  Take 1 tablet by mouth daily at 12 noon.   Yes [provider]  vitamin B-12 (CYANOCOBALAMIN) 1000 MCG tablet Take 1,000 mcg by mouth daily.   Yes [provider]  Vitamin D, Ergocalciferol, (DRISDOL) 1.25 MG (50000 UNIT) CAPS capsule Take 50,000 Units by mouth every Sunday.    Yes [provider]    Allergies    Patient has no known allergies.  Review of Systems   Review of Systems  Physical Exam Updated Vital Signs BP (!) 162/93   Pulse 84   Temp 97.9 F (36.6 C) (Oral)   Resp 18   SpO2 95%   Physical Exam Constitutional:      General: He is not in acute distress.    Appearance: Normal appearance. He is not toxic-appearing.  Cardiovascular:     Rate and Rhythm: Normal rate and regular rhythm.     Pulses: Normal pulses.  Pulmonary:     Effort: Pulmonary effort is normal.     Breath sounds: No wheezing.  Abdominal:     General: Bowel sounds are normal.     Tenderness: There is abdominal tenderness.     Comments: Suprapubic tenderness to palpation   Genitourinary:    Comments: Frank bleeding from urethra  Neurological:     Mental Status: He is alert.     ED Results / Procedures / Treatments   Labs (all labs ordered are listed, but only abnormal results are displayed) Labs Reviewed  CBC WITH DIFFERENTIAL/PLATELET - Abnormal; Notable for the following components:      Result Value   Neutro Abs 8.4 (*)    All other components within normal limits  BASIC METABOLIC PANEL - Abnormal; Notable for the following components:   Glucose, Bld 130 (*)    All other components within normal limits  URINALYSIS, ROUTINE W REFLEX MICROSCOPIC - Abnormal; Notable for the following components:   APPearance CLOUDY (*)    Hgb urine dipstick LARGE (*)    Protein, ur 100 (*)    Leukocytes,Ua LARGE (*)    RBC / HPF >50 (*)    WBC, UA >50 (*)    Bacteria, UA MANY (*)    All other components within normal limits  URINE CULTURE    EKG None  Radiology No  results found.  Procedures BLADDER CATHETERIZATION  Date/Time: 07/07/2020 9:01 PM Performed by: Drenda Freeze, MD Authorized by: Drenda Freeze, MD   Consent:    Consent obtained:  Verbal   Consent given by:  Patient   Risks, benefits, and alternatives were discussed: yes     Risks discussed:  False passage, pain, infection and urethral injury   Alternatives discussed:  No treatment Universal protocol:    Procedure explained and questions answered to  patient or proxy's satisfaction: yes     Required blood products, implants, devices, and special equipment available: yes     Patient identity confirmed:  Verbally with patient Pre-procedure details:    Procedure purpose:  Therapeutic   Preparation: Patient was prepped and draped in usual sterile fashion   Anesthesia:    Anesthesia method:  None Procedure details:    Provider performed due to:  Complicated insertion   Catheter insertion:  Temporary indwelling   Catheter type:  Foley   Catheter size:  16 Fr   Bladder irrigation: no     Number of attempts:  2   Urine characteristics:  Blood-tinged Post-procedure details:    Procedure completion:  Tolerated well, no immediate complications   (including critical care time)  Medications Ordered in ED Medications  cefTRIAXone (ROCEPHIN) 1 g in sodium chloride 0.9 % 100 mL IVPB (has no administration in time range)  tranexamic acid (CYKLOKAPRON) 1000 MG/10ML topical solution 500 mg (500 mg Topical Given 07/07/20 2153)    ED Course  I have reviewed the triage vital signs and the nursing notes.  Pertinent labs & imaging results that were available during my care of the patient were reviewed by me and considered in my medical decision making (see chart for details).  Clinical Course as of 07/07/20 2216  Thu Jul 07, 2020  2111 Patient's foley was replaced with 16 fr catheter. Patient tolerated procedure well and had initial blood tinged urine that transitioned to pale yellow.  Urine sample was collected for analysis. BMP and CBC collected as well.  [MS]    Clinical Course User Index [MS] Eulis Foster, MD   MDM Rules/Calculators/A&P                          Patient presented after unsuccessful insertion of foley catheter at home. Patient began to pass frank urethral blood thought to be due to prostatic/urethral trauma. Patient was agreeable to replacement catheter here in ED. Patient tolerated procedure well as outlined in detail above. U/A, BMP (WNL, glucose mildly elevated 130) and CBC (WNL) collected and was notable for UTI. Urine culture collected. Patient was treated with one dose of Ceftriaxone followed by 6 additional days of Keflex. Patient reported improved suprapubic discomfort following placement of catheter with residual oozing so topical TXA was applied around the urinary catheter. Patient and his daughter were instructed to hold Eliquis for 2-3 days until bleeding completely stopped around catheter. Return precautions were discussed and patient was determined to be safe for discharge home.  Final Clinical Impression(s) / ED Diagnoses Final diagnoses:  Problem with Foley catheter, initial encounter [facility])  Lower urinary tract infectious disease    Rx / DC Orders ED Discharge Orders         Ordered    cephALEXin (KEFLEX) 500 MG capsule  4 times daily        07/07/20 2212           Eulis Foster, MD 07/07/20 2216    Drenda Freeze, MD 07/07/20 2259

## 2020-07-07 NOTE — Discharge Instructions (Addendum)
Your foley catheter was replaced today.   Your urine showed that you have a urinary tract infection so you were treated with IV antibiotics. You will continue oral antibiotics at home for a total of 7 days.   Please be sure to follow up with your primary care and urologist regarding your changed catheter and UTI.   Please return to care if you begin to have a fever of 100.4 or greater, have chills, worsening abdominal pain, increased bleeding in or around your catheter or no urinary output for 6 hours.   Remember to hold your Eliquis for 2-3 days or until the oozing around the catheter stops.

## 2020-07-07 NOTE — ED Notes (Signed)
Bladder scan performed on pt at this time.  Pt retaining 360 cc of urine at this time.  Pt does report urgency.  Pt given urinal to attempt to void himself.  Blood noted to pt's pull up which he says is a result of his daughter removing his foley today.

## 2020-07-10 LAB — URINE CULTURE: Culture: >=100000 — AB

## 2020-07-11 ENCOUNTER — Telehealth: Payer: Self-pay | Admitting: Emergency Medicine

## 2020-07-11 NOTE — Telephone Encounter (Signed)
Post ED Visit - Positive Culture Follow-up  Culture report reviewed by antimicrobial stewardship pharmacist: [place] Team []  Elenor Quinones, Pharm.D. []  Heide [name], Pharm.D., BCPS AQ-ID []  Parks Neptune, Pharm.D., BCPS []  Alycia Rossetti, Pharm.D., BCPS []  [place], [place].D., BCPS, AAHIVP []  Legrand Como, Pharm.D., BCPS, AAHIVP []  Salome Arnt, PharmD, BCPS []  Johnnette Gourd, PharmD, BCPS []  Hughes Better, PharmD, BCPS []  Leeroy [name], PharmD []  Laqueta Linden, PharmD, BCPS []  Albertina Parr, PharmD  [place] Team []  Leodis Sias, PharmD []  Lindell Spar, PharmD []  Royetta Asal, PharmD []  Graylin Shiver, Rph []  Rema Fendt) Glennon Mac, PharmD []  Arlyn Dunning, PharmD []  Netta Cedars, PharmD []  Dia Sitter, PharmD []  Leone Haven, PharmD []  Gretta Arab, PharmD []  Theodis Shove, PharmD []  Peggyann Juba, PharmD []  Reuel Boom, PharmD Jimmy Footman PharmD  Positive urine culture Treated with cephalexin, organism sensitive to the same and no further patient follow-up is required at this time.  Hazle Nordmann 07/11/2020, 2:19 PM

## 2020-10-01 ENCOUNTER — Telehealth: Payer: Self-pay | Admitting: Urology

## 2020-10-01 NOTE — Telephone Encounter (Signed)
Returned call from patient's wife. No answer. Left voicemail with callback instructions.

## 2020-11-03 ENCOUNTER — Other Ambulatory Visit: Payer: Self-pay

## 2020-11-03 ENCOUNTER — Encounter (HOSPITAL_COMMUNITY): Payer: Self-pay

## 2020-11-03 ENCOUNTER — Emergency Department (HOSPITAL_COMMUNITY)
Admission: EM | Admit: 2020-11-03 | Discharge: 2020-11-03 | Disposition: A | Discharge status: Home / Self Care | Payer: Medicare Other | Attending: Emergency Medicine | Admitting: Emergency Medicine

## 2020-11-03 DIAGNOSIS — Z7901 Long term (current) use of anticoagulants: Secondary | ICD-10-CM | POA: Insufficient documentation

## 2020-11-03 DIAGNOSIS — I4891 Unspecified atrial fibrillation: Secondary | ICD-10-CM | POA: Insufficient documentation

## 2020-11-03 DIAGNOSIS — Z87891 Personal history of nicotine dependence: Secondary | ICD-10-CM | POA: Insufficient documentation

## 2020-11-03 DIAGNOSIS — R339 Retention of urine, unspecified: Secondary | ICD-10-CM | POA: Diagnosis present

## 2020-11-03 DIAGNOSIS — I10 Essential (primary) hypertension: Secondary | ICD-10-CM | POA: Diagnosis not present

## 2020-11-03 DIAGNOSIS — N390 Urinary tract infection, site not specified: Secondary | ICD-10-CM

## 2020-11-03 DIAGNOSIS — Z79899 Other long term (current) drug therapy: Secondary | ICD-10-CM | POA: Insufficient documentation

## 2020-11-03 DIAGNOSIS — Z8546 Personal history of malignant neoplasm of prostate: Secondary | ICD-10-CM | POA: Insufficient documentation

## 2020-11-03 DIAGNOSIS — R319 Hematuria, unspecified: Secondary | ICD-10-CM

## 2020-11-03 LAB — URINALYSIS, ROUTINE W REFLEX MICROSCOPIC
Bacteria, UA: NONE SEEN
RBC / HPF: >50 RBC/hpf — ABNORMAL HIGH (ref 0–5)
WBC, UA: >50 WBC/hpf — ABNORMAL HIGH (ref 0–5)

## 2020-11-03 LAB — CBC
HCT: 43.5 % (ref 39.0–52.0)
Hemoglobin: 14.2 g/dL (ref 13.0–17.0)
MCH: 28.5 pg (ref 26.0–34.0)
MCHC: 32.6 g/dL (ref 30.0–36.0)
MCV: 87.2 fL (ref 80.0–100.0)
Platelets: 209 10*3/uL (ref 150–400)
RBC: 4.99 MIL/uL (ref 4.22–5.81)
RDW: 13.9 % (ref 11.5–15.5)
WBC: 8.8 10*3/uL (ref 4.0–10.5)
nRBC: 0 % (ref 0.0–0.2)

## 2020-11-03 LAB — COMPREHENSIVE METABOLIC PANEL
ALT: 15 U/L (ref 0–44)
AST: 24 U/L (ref 15–41)
Albumin: 4.1 g/dL (ref 3.5–5.0)
Alkaline Phosphatase: 81 U/L (ref 38–126)
Anion gap: 11 (ref 5–15)
BUN: 23 mg/dL (ref 8–23)
CO2: 24 mmol/L (ref 22–32)
Calcium: 9.8 mg/dL (ref 8.9–10.3)
Chloride: 105 mmol/L (ref 98–111)
Creatinine, Ser: 1.7 mg/dL — ABNORMAL HIGH (ref 0.61–1.24)
GFR, Estimated: 41 mL/min — ABNORMAL LOW (ref >60)
Glucose, Bld: 150 mg/dL — ABNORMAL HIGH (ref 70–99)
Potassium: 4.4 mmol/L (ref 3.5–5.1)
Sodium: 140 mmol/L (ref 135–145)
Total Bilirubin: 0.6 mg/dL (ref 0.3–1.2)
Total Protein: 7.7 g/dL (ref 6.5–8.1)

## 2020-11-03 LAB — LIPASE, BLOOD: Lipase: 29 U/L (ref 11–51)

## 2020-11-03 MED ORDER — CEPHALEXIN 500 MG PO CAPS: 500.0000 mg | ORAL_CAPSULE | Freq: Four times a day (QID) | ORAL | 0 refills | Status: DC

## 2020-11-03 MED ORDER — CEPHALEXIN 500 MG PO CAPS
500.0000 mg | ORAL_CAPSULE | Freq: Once | ORAL | Status: AC
  Administered 2020-11-03: 500 mg via ORAL
  Filled 2020-11-03: qty 1

## 2020-11-03 NOTE — ED Notes (Signed)
Bladder scanner performed x2. 473ml and 567ml.

## 2020-11-03 NOTE — ED Notes (Addendum)
862ml drained form new foley.

## 2020-11-03 NOTE — ED Provider Notes (Signed)
Emergency Medicine Provider Triage Evaluation Note  Nathan [name] , a 75 y.o. male  was evaluated in triage.  Pt complains of blocked catheter.  Review of Systems  Positive: Decreased urine output in Foley bag Negative: No vomiting, fever, chills or weakness  Physical Exam  BP (!) 158/108 (BP Location: Left Arm)   Pulse 81   Temp 98.4 F (36.9 C) (Oral)   Resp 16   Ht 6\' 1"  (1.854 m)   Wt 80.7 kg   SpO2 94%   BMI 23.48 kg/m  Gen:   Awake, no distress sitting fairly comfortably Resp:  Normal effort, no respiratory distress MSK:   Moves extremities without difficulty, no rash or deformity Other:  Abdomen distended, tender suprapubic region  Medical Decision Making  Medically screening exam initiated at 8:11 AM.  Appropriate orders placed.  Torien Ramroop was informed that the remainder of the evaluation will be completed by another provider, this initial triage assessment does not replace that evaluation, and the importance of remaining in the ED until their evaluation is complete.  Bladder scan, replace Foley if needed   Daleen Bo, MD 11/03/20 [phone]

## 2020-11-03 NOTE — ED Provider Notes (Signed)
[facility] DEPT Provider Note   CSN: [id number] Arrival date & time: 11/03/20  4132     History Chief Complaint  Patient presents with  . Emesis  . Abdominal Pain    Nathan [name] is a 75 y.o. male.  HPI Presents for evaluation of Foley catheter not draining since last night.  He has previously had this happen.  He has a chronic indwelling Foley for the last 2 years because of complications post prostate cancer.  He has not had fever, vomiting, dizziness or weakness.  Most of the history is gained by his daughter by telephone.  There are no other known modifying factors.    Past Medical History:  Diagnosis Date  . Atrial fibrillation ([place]) 01/2020   resolved now   . Cancer of prostate ([place]) 2020  . Chronic anticoagulation   . Foley catheter in place    will be changed 05-16-20  . HTN (hypertension)   . Lipoma of chest wall   . Pneumonia 01/2020   and uti in hospital for 1 week  . WPW (Wolff-Parkinson-White syndrome)    No prior ablation    Patient Active Problem List   Diagnosis Date Noted  . Septic shock ([place]) 02/22/2020  . Malignant neoplasm of prostate ([place]) 07/07/2019  . Atrial fibrillation ([place]) 06/05/2019  . Long term (current) use of anticoagulants 06/05/2019    Past Surgical History:  Procedure Laterality Date  . CATARACT EXTRACTION Bilateral 2020  . LIPOMA EXCISION Left 05/16/2020   Procedure: EXCISION LIPOMA LEFT CHEST WALL;  Surgeon: Kinsinger, Arta Bruce, MD;  Location: [facility];  Service: General;  Laterality: Left;  . RADIOACTIVE SEED IMPLANT  2020       Family History  Problem Relation Age of Onset  . Lung cancer Mother   . Cervical cancer Sister   . Breast cancer Neg Hx   . Pancreatic cancer Neg Hx   . Colon cancer Neg Hx     Social History   Tobacco Use  . Smoking status: Former Smoker    Packs/day: 0.50    Years: 60.00    Pack years: 30.00    Types: Cigarettes    Quit date:  01/31/2019    Years since quitting: 1.7  . Smokeless tobacco: Former Network engineer  . Vaping Use: Never used  Substance Use Topics  . Alcohol use: Not Currently  . Drug use: Never    Home Medications Prior to Admission medications   Medication Sig Start Date End Date Taking? Authorizing Provider  cephALEXin (KEFLEX) 500 MG capsule Take 1 capsule (500 mg total) by mouth 4 (four) times daily. 11/03/20  Yes Daleen Bo, MD  ELIQUIS 5 MG TABS tablet Take 1 tablet by mouth twice daily Patient taking differently: Take 5 mg by mouth 2 (two) times daily. 06/14/20  Yes Hilty, Nadean Corwin, MD  finasteride (PROSCAR) 5 MG tablet Take 5 mg by mouth daily.   Yes [provider]  folic acid (FOLVITE) 1 MG tablet Take 1 mg by mouth daily. 03/24/19  Yes [provider]  lisinopril (ZESTRIL) 40 MG tablet Take 40 mg by mouth daily. 09/24/20  Yes [provider]  magnesium oxide (MAG-OX) 400 MG tablet Take 400 mg by mouth daily.  03/24/19  Yes [provider]  metoprolol succinate (TOPROL-XL) 50 MG 24 hr tablet Take 50 mg by mouth daily. 08/16/20  Yes [provider]  oxybutynin (DITROPAN-XL) 10 MG 24 hr tablet Take 10  mg by mouth daily. 07/07/20  Yes [provider]  Prenatal Vit-Fe Fumarate-FA (PRENATAL MULTIVITAMIN) TABS tablet Take 1 tablet by mouth daily at 12 noon.   Yes [provider]  vitamin B-12 (CYANOCOBALAMIN) 1000 MCG tablet Take 1,000 mcg by mouth daily.   Yes [provider]  Vitamin D, Ergocalciferol, (DRISDOL) 1.25 MG (50000 UNIT) CAPS capsule Take 50,000 Units by mouth every Sunday.    Yes [provider]  metoprolol succinate (TOPROL-XL) 25 MG 24 hr tablet Take 1 tablet by mouth once daily Patient not taking: Reported on 11/03/2020 06/14/20   Pixie Casino, MD    Allergies    Patient has no known allergies.  Review of Systems   Review of Systems  All other systems reviewed and are negative.   Physical  Exam Updated Vital Signs BP 140/86 (BP Location: Right Arm)   Pulse 77   Temp 98.4 F (36.9 C) (Oral)   Resp 18   Ht 6\' 1"  (1.854 m)   Wt 80.7 kg   SpO2 94%   BMI 23.48 kg/m   Physical Exam Vitals and nursing note reviewed.  Constitutional:      General: He is not in acute distress.    Appearance: He is well-developed. He is not ill-appearing, toxic-appearing or diaphoretic.  HENT:     Head: Normocephalic and atraumatic.     Right Ear: External ear normal.     Left Ear: External ear normal.  Eyes:     Conjunctiva/sclera: Conjunctivae normal.     Pupils: Pupils are equal, round, and reactive to light.  Neck:     Trachea: Phonation normal.  Cardiovascular:     Rate and Rhythm: Normal rate and regular rhythm.     Heart sounds: Normal heart sounds.  Pulmonary:     Effort: Pulmonary effort is normal.     Breath sounds: Normal breath sounds.  Abdominal:     General: There is no distension.     Palpations: Abdomen is soft. There is mass (suprapubic).     Tenderness: There is abdominal tenderness (Suprapubic, mild).  Musculoskeletal:        General: Normal range of motion.     Cervical back: Normal range of motion and neck supple.  Skin:    General: Skin is warm and dry.  Neurological:     Mental Status: He is alert and oriented to person, place, and time.     Cranial Nerves: No cranial nerve deficit.     Sensory: No sensory deficit.     Motor: No abnormal muscle tone.     Coordination: Coordination normal.  Psychiatric:        Mood and Affect: Mood normal.        Behavior: Behavior normal.        Thought Content: Thought content normal.        Judgment: Judgment normal.     ED Results / Procedures / Treatments   Labs (all labs ordered are listed, but only abnormal results are displayed) Labs Reviewed  COMPREHENSIVE METABOLIC PANEL - Abnormal; Notable for the following components:      Result Value   Glucose, Bld 150 (*)    Creatinine, Ser 1.70 (*)    GFR,  Estimated 41 (*)    All other components within normal limits  URINALYSIS, ROUTINE W REFLEX MICROSCOPIC - Abnormal; Notable for the following components:   Color, Urine RED (*)    APPearance CLOUDY (*)    Glucose, UA   (*)  Value: TEST NOT REPORTED DUE TO COLOR INTERFERENCE OF URINE PIGMENT   Hgb urine dipstick   (*)    Value: TEST NOT REPORTED DUE TO COLOR INTERFERENCE OF URINE PIGMENT   Bilirubin Urine   (*)    Value: TEST NOT REPORTED DUE TO COLOR INTERFERENCE OF URINE PIGMENT   Ketones, ur   (*)    Value: TEST NOT REPORTED DUE TO COLOR INTERFERENCE OF URINE PIGMENT   Protein, ur   (*)    Value: TEST NOT REPORTED DUE TO COLOR INTERFERENCE OF URINE PIGMENT   Nitrite   (*)    Value: TEST NOT REPORTED DUE TO COLOR INTERFERENCE OF URINE PIGMENT   Leukocytes,Ua   (*)    Value: TEST NOT REPORTED DUE TO COLOR INTERFERENCE OF URINE PIGMENT   RBC / HPF >50 (*)    WBC, UA >50 (*)    All other components within normal limits  URINE CULTURE  LIPASE, BLOOD  CBC    EKG None  Radiology No results found.  Procedures Procedures   Medications Ordered in ED Medications  cephALEXin (KEFLEX) capsule 500 mg (has no administration in time range)    ED Course  I have reviewed the triage vital signs and the nursing notes.  Pertinent labs & imaging results that were available during my care of the patient were reviewed by me and considered in my medical decision making (see chart for details).    MDM Rules/Calculators/A&P                           Patient Vitals for the past 24 hrs:  BP Temp Temp src Pulse Resp SpO2 Height Weight  11/03/20 1500 140/86 -- Oral 77 18 94 % -- --  11/03/20 1430 (!) 143/85 -- -- 77 18 93 % -- --  11/03/20 1415 (!) 149/90 -- -- 80 -- 94 % -- --  11/03/20 1400 (!) 143/93 -- -- 84 18 94 % -- --  11/03/20 1345 (!) 151/90 -- -- 81 18 94 % -- --  11/03/20 1330 (!) 140/92 -- -- 78 -- 93 % -- --  11/03/20 1315 (!) 143/95 -- -- 77 18 94 % -- --  11/03/20  1300 (!) 144/90 -- -- 79 -- 93 % -- --  11/03/20 1245 (!) 156/91 -- -- 90 18 98 % -- --  11/03/20 1230 (!) 152/90 -- -- 80 -- 95 % -- --  11/03/20 1215 (!) 155/91 -- -- 84 -- 95 % -- --  11/03/20 1200 (!) 151/98 -- -- 77 18 93 % -- --  11/03/20 1145 (!) 193/123 -- -- 83 -- 97 % -- --  11/03/20 1130 (!) 157/90 -- -- 81 18 93 % -- --  11/03/20 1115 (!) 152/92 -- -- 80 -- 92 % -- --  11/03/20 1100 (!) 152/93 -- -- 77 16 94 % -- --  11/03/20 1045 (!) 159/95 -- -- 80 -- 93 % -- --  11/03/20 1030 (!) 168/91 -- -- 83 -- 97 % -- --  11/03/20 1015 (!) 194/110 -- -- 79 -- 95 % -- --  11/03/20 1000 (!) 176/99 -- -- 75 -- 95 % -- --  11/03/20 0945 (!) 177/105 -- -- 75 16 93 % -- --  11/03/20 0930 (!) 164/91 -- -- 77 -- 94 % -- --  11/03/20 0759 -- -- -- -- -- -- 6\' 1"  (1.854 m) 80.7 kg  11/03/20 0758 (!) 158/108  98.4 F (36.9 C) Oral 81 16 94 % -- --    1:51 PM Reevaluation with update and discussion. After initial assessment and treatment, an updated evaluation reveals he is more comfortable at this time.  Foley catheter is draining, bloody urine with clots.  Urinalysis, is obscured by blood.  Will send urine culture Will irrigate Foley, and then try to recollect urinalysis. Daleen Bo   3:30 PM-Case discussed with patient's daughter, Nathan [name], she will come here to get him.  Medical Decision Making:  This patient is presenting for evaluation of decreased urinary output, which does require a range of treatment options, and is a complaint that involves a moderate risk of morbidity and mortality. The differential diagnoses include UTI, catheter malfunction, acute renal failure. I decided to review old records, and in summary [place] patient with chronic Foley catheter presenting with decreased urine output.  I did not require additional historical information from anyone.  Clinical Laboratory Tests Ordered, included CBC, Metabolic panel, Urinalysis and Lipase. Review indicates normal except glucose  high, creatinine high, GFR low, marked blood, when catheter was changed, with red and white cells, urine culture sent.  I ordered and Reviewed the bladder scan medicine Test.  Greater than 400 cc urine in the urinary bladder  Critical Interventions-clinical evaluation, laboratory testing, evaluation for bladder outlet obstruction, Place Foley catheter with irrigation  After These Interventions, the Patient was reevaluated and was found with abnormal urinalysis, following changing Foley catheter, which appeared to be blocked by blood.  Suspect urinary tract infection, culture sent.  Antibiotics started.  Patient comfortable after Foley catheter change  CRITICAL CARE-no Performed by: Daleen Bo   Nursing Notes Reviewed/ Care Coordinated Applicable Imaging Reviewed Interpretation of Laboratory Data incorporated into ED treatment  The patient appears reasonably screened and/or stabilized for discharge and I doubt any other medical condition or other [facility] requiring further screening, evaluation, or treatment in the ED at this time prior to discharge.  Plan: Home Medications-continue usual; Home Treatments-gradually advance diet and activity; return here if the recommended treatment, does not improve the symptoms; Recommended follow up-urology follow-up 1 week and as needed     Final Clinical Impression(s) / ED Diagnoses Final diagnoses:  Urinary retention  Urinary tract infection with hematuria, site unspecified    Rx / DC Orders ED Discharge Orders         Ordered    cephALEXin (KEFLEX) 500 MG capsule  4 times daily        11/03/20 1530           Daleen Bo, MD 11/03/20 1531

## 2020-11-03 NOTE — ED Triage Notes (Signed)
Patient c/o lower abdominal pain and states he vomited "dark brown" emesis x 2 this AM  Patient also reports no urine in his leg bag this AM.

## 2020-11-03 NOTE — Discharge Instructions (Signed)
Encouraged him to eat a regular diet and drink plenty of fluids.  Start the antibiotic for UTI, this evening.  Call his urologist for follow-up appointment next week.

## 2020-11-05 LAB — URINE CULTURE: Culture: >=100000 — AB

## 2020-11-06 ENCOUNTER — Telehealth: Payer: Self-pay | Admitting: Emergency Medicine

## 2020-11-06 NOTE — Telephone Encounter (Signed)
Post ED Visit - Positive Culture Follow-up: Unsuccessful Patient Follow-up  Culture assessed and recommendations reviewed by:  []  Elenor Quinones, Pharm.D. []  Heide [name], Pharm.D., BCPS AQ-ID []  Parks Neptune, Pharm.D., BCPS []  Alycia Rossetti, Pharm.D., BCPS []  [place], [place].D., BCPS, AAHIVP []  Legrand Como, Pharm.D., BCPS, AAHIVP [x]  Graylin Shiver, PharmD []  Vincenza Hews, PharmD, BCPS  Positive urine culture  []  Patient discharged without antimicrobial prescription and treatment is now indicated [x]  Organism is resistant to prescribed ED discharge antimicrobial []  Patient with positive blood cultures   Unable to contact patient @ phone numbers on file, letter will be sent to address on file.  Plan:  D/c Keflex Start: Ampicillin 500 mg every six hours for seven days - Wyn Quaker PA  Nathan [name] 11/06/2020, 2:03 PM

## 2020-11-06 NOTE — Progress Notes (Signed)
ED Antimicrobial Stewardship Positive Culture Follow Up   Nathan [name] is an 75 y.o. male who presented to [facility] on 11/03/2020 with a chief complaint of  Chief Complaint  Patient presents with  . Emesis  . Abdominal Pain    Recent Results (from the past 720 hour(s))  Urine culture     Status: Abnormal   Collection Time: 11/03/20 10:38 AM   Specimen: Urine, Catheterized  Result Value Ref Range Status   Specimen Description   Final    URINE, CATHETERIZED Performed at [facility] [address], [place], [place] [address]    Special Requests   Final    NONE Performed at [facility], [place] [address] [place], [place] [address]    Culture >=100,000 COLONIES/mL ENTEROCOCCUS FAECALIS (A)  Final   Report Status 11/05/2020 FINAL  Final   Organism ID, Bacteria ENTEROCOCCUS FAECALIS (A)  Final      Susceptibility   Enterococcus faecalis - MIC*    AMPICILLIN <=2 SENSITIVE Sensitive     NITROFURANTOIN <=16 SENSITIVE Sensitive     VANCOMYCIN 1 SENSITIVE Sensitive     * >=100,000 COLONIES/mL ENTEROCOCCUS FAECALIS    Foley not draining, exchanged foley, clots noted, appeared infectious, given Keflex 500mg  x1 in ED  [x]  Treated with Keflex, organism resistant to prescribed antimicrobial []  Patient discharged originally without antimicrobial agent and treatment is now indicated  New antibiotic prescription: Ampicillin 500mg  po q6 hr x 7 days, #28 capsules  ED Provider: Wyn Quaker, PA-C   Minda Ditto PharmD 11/06/2020, 9:26 AM Clinical Pharmacist [phone]

## 2020-11-11 ENCOUNTER — Telehealth: Payer: Self-pay | Admitting: Emergency Medicine

## 2020-11-11 NOTE — Telephone Encounter (Signed)
Post ED Visit - Positive Culture Follow-up: Successful Patient Follow-Up  Culture assessed and recommendations reviewed by:  []  Elenor Quinones, Pharm.D. []  Heide [name], Pharm.D., BCPS AQ-ID []  Parks Neptune, Pharm.D., BCPS []  Alycia Rossetti, Pharm.D., BCPS []  [place], Pharm.D., BCPS, AAHIVP []  Legrand Como, Pharm.D., BCPS, AAHIVP []  Salome Arnt, PharmD, BCPS []  Johnnette Gourd, PharmD, BCPS []  Hughes Better, PharmD, BCPS [x]  Graylin Shiver, PharmD  Positive urine culture  []  Patient discharged without antimicrobial prescription and treatment is now indicated [x]  Organism is resistant to prescribed ED discharge antimicrobial []  Patient with positive blood cultures  Changes discussed with ED provider: Wyn Quaker, PA* New antibiotic prescription: Ampicillin 500 mg every six hours for seven days Called to [place] ([place])  Contacted patient, date 11/11/20, time [place] 11/11/2020, 2:31 PM

## 2021-01-07 ENCOUNTER — Other Ambulatory Visit: Payer: Self-pay | Admitting: Internal Medicine

## 2021-01-09 NOTE — Telephone Encounter (Signed)
59m, 77.1kg, scr 1.7 11/03/20, lovw/hilty 04/13/20

## 2021-04-18 ENCOUNTER — Other Ambulatory Visit: Payer: Self-pay

## 2021-04-18 ENCOUNTER — Emergency Department (HOSPITAL_COMMUNITY)
Admission: EM | Admit: 2021-04-18 | Discharge: 2021-04-18 | Disposition: A | Discharge status: Home / Self Care | Payer: Medicare Other | Attending: Emergency Medicine | Admitting: Emergency Medicine

## 2021-04-18 ENCOUNTER — Encounter (HOSPITAL_COMMUNITY): Payer: Self-pay

## 2021-04-18 DIAGNOSIS — Z87891 Personal history of nicotine dependence: Secondary | ICD-10-CM | POA: Diagnosis not present

## 2021-04-18 DIAGNOSIS — T83091A Other mechanical complication of indwelling urethral catheter, initial encounter: Secondary | ICD-10-CM | POA: Diagnosis present

## 2021-04-18 DIAGNOSIS — I10 Essential (primary) hypertension: Secondary | ICD-10-CM | POA: Insufficient documentation

## 2021-04-18 DIAGNOSIS — Y846 Urinary catheterization as the cause of abnormal reaction of the patient, or of later complication, without mention of misadventure at the time of the procedure: Secondary | ICD-10-CM | POA: Diagnosis not present

## 2021-04-18 DIAGNOSIS — Z79899 Other long term (current) drug therapy: Secondary | ICD-10-CM | POA: Diagnosis not present

## 2021-04-18 DIAGNOSIS — I4891 Unspecified atrial fibrillation: Secondary | ICD-10-CM | POA: Insufficient documentation

## 2021-04-18 DIAGNOSIS — Z7901 Long term (current) use of anticoagulants: Secondary | ICD-10-CM | POA: Diagnosis not present

## 2021-04-18 DIAGNOSIS — Z8546 Personal history of malignant neoplasm of prostate: Secondary | ICD-10-CM | POA: Diagnosis not present

## 2021-04-18 DIAGNOSIS — R310 Gross hematuria: Secondary | ICD-10-CM

## 2021-04-18 NOTE — ED Triage Notes (Signed)
Patients urinary catheter is clogged. Patient last drained it around 8pm. Patient has had his catheter for a month now.

## 2021-04-18 NOTE — ED Provider Notes (Signed)
[facility] DEPT Provider Note: Georgena Spurling, MD, FACEP  CSN: [id number] MRN: [id number] ARRIVAL: 04/18/21 at 0137 ROOM: WTR1/WLPT1   CHIEF COMPLAINT  Foley Catheter Clogged   HISTORY OF PRESENT ILLNESS  04/18/21 1:58 AM Nathan [name] is a 75 y.o. male who has had an indwelling Foley catheter for about 2 years due to complications of prostate cancer.  He states he was most recently changed about a month ago.  It was last draining urine about 8 PM yesterday evening.  He now believes it is clogged and he cannot empty his bladder.  He is having pain in his bladder which she rates as a 10 out of 10, aching in nature.  It is worse with palpation of his suprapubic region.     Past Medical History:  Diagnosis Date   Atrial fibrillation ([place]) 01/2020   resolved now    Cancer of prostate ([place]) 2020   Chronic anticoagulation    Foley catheter in place    will be changed 05-16-20   HTN (hypertension)    Lipoma of chest wall    Pneumonia 01/2020   and uti in hospital for 1 week   WPW (Wolff-Parkinson-White syndrome)    No prior ablation    Past Surgical History:  Procedure Laterality Date   CATARACT EXTRACTION Bilateral 2020   LIPOMA EXCISION Left 05/16/2020   Procedure: EXCISION LIPOMA LEFT CHEST WALL;  Surgeon: Kinsinger, Arta Bruce, MD;  Location: [facility];  Service: General;  Laterality: Left;   RADIOACTIVE SEED IMPLANT  2020    Family History  Problem Relation Age of Onset   Lung cancer Mother    Cervical cancer Sister    Breast cancer Neg Hx    Pancreatic cancer Neg Hx    Colon cancer Neg Hx     Social History   Tobacco Use   Smoking status: Former    Packs/day: 0.50    Years: 60.00    Pack years: 30.00    Types: Cigarettes    Quit date: 01/31/2019    Years since quitting: 2.2   Smokeless tobacco: Former  Scientific laboratory technician Use: Never used  Substance Use Topics   Alcohol use: Not Currently   Drug use: Never    Prior to Admission  medications   Medication Sig Start Date End Date Taking? Authorizing Provider  apixaban (ELIQUIS) 5 MG TABS tablet Take 1 tablet by mouth twice daily 01/09/21   Hilty, Nadean Corwin, MD  cephALEXin (KEFLEX) 500 MG capsule Take 1 capsule (500 mg total) by mouth 4 (four) times daily. 11/03/20   Daleen Bo, MD  finasteride (PROSCAR) 5 MG tablet Take 5 mg by mouth daily.    [provider]  folic acid (FOLVITE) 1 MG tablet Take 1 mg by mouth daily. 03/24/19   [provider]  lisinopril (ZESTRIL) 40 MG tablet Take 40 mg by mouth daily. 09/24/20   [provider]  magnesium oxide (MAG-OX) 400 MG tablet Take 400 mg by mouth daily.  03/24/19   [provider]  metoprolol succinate (TOPROL-XL) 25 MG 24 hr tablet Take 1 tablet by mouth once daily Patient not taking: Reported on 11/03/2020 06/14/20   Pixie Casino, MD  metoprolol succinate (TOPROL-XL) 50 MG 24 hr tablet Take 50 mg by mouth daily. 08/16/20   [provider]  oxybutynin (DITROPAN-XL) 10 MG 24 hr tablet Take 10 mg by mouth daily. 07/07/20   [provider]  Prenatal Vit-Fe Fumarate-FA (  PRENATAL MULTIVITAMIN) TABS tablet Take 1 tablet by mouth daily at 12 noon.    [provider]  vitamin B-12 (CYANOCOBALAMIN) 1000 MCG tablet Take 1,000 mcg by mouth daily.    [provider]  Vitamin D, Ergocalciferol, (DRISDOL) 1.25 MG (50000 UNIT) CAPS capsule Take 50,000 Units by mouth every Sunday.     [provider]    Allergies Patient has no known allergies.   REVIEW OF SYSTEMS  Negative except as noted here or in the History of Present Illness.   PHYSICAL EXAMINATION  Initial Vital Signs Blood pressure (!) 190/117, pulse 69, temperature 97.9 F (36.6 C), temperature source Oral, resp. rate 20, height 6\' 1"  (1.854 m), weight 84.8 kg, SpO2 97 %.  Examination General: Well-developed, well-nourished male in no acute distress; appearance consistent with age of record HENT:  normocephalic; atraumatic Eyes: Arcus senilis bilaterally Neck: supple Heart: regular rate and rhythm Lungs: clear to auscultation bilaterally Abdomen: soft; tender, distended bladder; bowel sounds present GU: Tanner V male, uncircumcised; Foley catheter without drainage Extremities: No deformity; full range of motion; pulses normal Neurologic: Awake, alert and oriented; motor function intact in all extremities and symmetric; no facial droop Skin: Warm and dry Psychiatric: Flat affect   RESULTS  Summary of this visit's results, reviewed and interpreted by myself:   EKG Interpretation  Date/Time:    Ventricular Rate:    PR Interval:    QRS Duration:   QT Interval:    QTC Calculation:   R Axis:     Text Interpretation:         Laboratory Studies: No results found for this or any previous visit (from the past 24 hour(s)). Imaging Studies: No results found.  ED COURSE and MDM  Nursing notes, initial and subsequent vitals signs, including pulse oximetry, reviewed and interpreted by myself.  Vitals:   04/18/21 0147 04/18/21 0150 04/18/21 0153  BP:   (!) 190/117  Pulse:  69   Resp:  20   Temp:  97.9 F (36.6 C)   TempSrc:  Oral   SpO2:  97%   Weight: 84.8 kg    Height: 6\' 1"  (1.854 m)     Medications - No data to display  2:29 AM Nursing staff unable to irrigate Foley catheter.  Foley catheter removed and replaced with a new 75 French catheter.  Grossly bloody urine obtained.  Once bladder completely empties will irrigate bladder to remove any remaining clots.  Nursing staff reports the 30 mL balloon on the original Foley was only inflated to about 10 mL.   3:14 AM Bladder irrigated and urine is somewhat more clear but still grossly bloody.  Patient is voiding without difficulty and is without discomfort at this time.  He has history of his Foley catheter clogging due to gross hematuria in the past, seen in the ED for the same in May of this year.  PROCEDURES   Procedures   ED DIAGNOSES     ICD-10-CM   1. Obstructed Foley catheter, initial encounter ([place])  T83.091A     2. Gross hematuria  R31.0          Nazaiah Navarrete, Jenny Reichmann, MD 04/18/21 [phone]

## 2021-04-20 LAB — URINE CULTURE: Culture: >=100000 — AB

## 2021-04-21 ENCOUNTER — Telehealth: Payer: Self-pay

## 2021-04-21 NOTE — Telephone Encounter (Signed)
Post ED Visit - Positive Culture Follow-up  Culture report reviewed by antimicrobial stewardship pharmacist: [facility] Team []  Elenor Quinones, Pharm.D. []  Heide [name], Pharm.D., BCPS AQ-ID []  Parks Neptune, Pharm.D., BCPS []  Alycia Rossetti, Pharm.D., BCPS []  [place], Pharm.D., BCPS, AAHIVP []  Legrand Como, Pharm.D., BCPS, AAHIVP []  Salome Arnt, PharmD, BCPS []  Johnnette Gourd, PharmD, BCPS []  Hughes Better, PharmD, BCPS []  Leeroy [name], PharmD []  Laqueta Linden, PharmD, BCPS []  Albertina Parr, PharmD  [facility] Team []  Leodis Sias, PharmD []  Lindell Spar, PharmD []  Royetta Asal, PharmD []  Graylin Shiver, Rph []  Rema Fendt) Glennon Mac, PharmD []  Arlyn Dunning, PharmD []  Netta Cedars, PharmD []  Dia Sitter, PharmD []  Leone Haven, PharmD [x]  Gretta Arab, PharmD []  Theodis Shove, PharmD []  Peggyann Juba, PharmD []  Reuel Boom, PharmD   Positive urine culture No treatment needed, no further patient follow-up is required at this time.  Glennon Hamilton 04/21/2021, 1:05 PM

## 2021-04-21 NOTE — Progress Notes (Signed)
ED Antimicrobial Stewardship Positive Culture Follow Up   Nathan [name] is an 75 y.o. male who presented to [facility] on 04/18/2021 with a chief complaint of  Chief Complaint  Patient presents with   Foley Catheter Clogged    Recent Results (from the past 720 hour(s))  Urine Culture     Status: Abnormal   Collection Time: 04/18/21  3:51 AM   Specimen: Urine, Catheterized  Result Value Ref Range Status   Specimen Description   Final    URINE, CATHETERIZED Performed at [facility], [place] [address] [place], [place] [address]    Special Requests   Final    NONE Performed at [facility], [place] [address], [place], [place] [address]    Culture >=100,000 COLONIES/mL ENTEROCOCCUS FAECALIS (A)  Final   Report Status 04/20/2021 FINAL  Final   Organism ID, Bacteria ENTEROCOCCUS FAECALIS (A)  Final      Susceptibility   Enterococcus faecalis - MIC*    AMPICILLIN <=2 SENSITIVE Sensitive     NITROFURANTOIN <=16 SENSITIVE Sensitive     VANCOMYCIN 2 SENSITIVE Sensitive     * >=100,000 COLONIES/mL ENTEROCOCCUS FAECALIS   37 YOM w/ chronic foley x2 yrs d/t prostate CA presenting with clogged foley. Last changed ~1 mo ago. CC of 10/10 bladder pain upon arrival but afebrile and no other systemic sx. RN unable to irrigate; foley removed and replaced. Grossly bloody urine w/ hx of hematuria and PTA apixaban. Bladder irrigated and still bloody, but no discomfort and voiding w/o difficulty. No UA or blood work. F/U w/ Dr. Alinda Money, Urology.  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  Previous Cxt:  11/03/20 - E.faecalis  07/07/20 - K.pneumoniae  02/22/20 - K.pneumoniae, E.aerogenes   Plan: No tx necessary  ED Provider: Milton Ferguson, MD  Kristopher Oppenheim, Student-PharmD

## 2021-05-01 ENCOUNTER — Emergency Department (HOSPITAL_COMMUNITY)
Admission: EM | Admit: 2021-05-01 | Discharge: 2021-05-02 | Disposition: A | Discharge status: Home / Self Care | Payer: Medicare Other | Attending: Emergency Medicine | Admitting: Emergency Medicine

## 2021-05-01 ENCOUNTER — Other Ambulatory Visit: Payer: Self-pay

## 2021-05-01 ENCOUNTER — Encounter (HOSPITAL_COMMUNITY): Payer: Self-pay

## 2021-05-01 DIAGNOSIS — Z79899 Other long term (current) drug therapy: Secondary | ICD-10-CM | POA: Insufficient documentation

## 2021-05-01 DIAGNOSIS — C61 Malignant neoplasm of prostate: Secondary | ICD-10-CM | POA: Diagnosis not present

## 2021-05-01 DIAGNOSIS — Y846 Urinary catheterization as the cause of abnormal reaction of the patient, or of later complication, without mention of misadventure at the time of the procedure: Secondary | ICD-10-CM | POA: Insufficient documentation

## 2021-05-01 DIAGNOSIS — I10 Essential (primary) hypertension: Secondary | ICD-10-CM | POA: Insufficient documentation

## 2021-05-01 DIAGNOSIS — Z87891 Personal history of nicotine dependence: Secondary | ICD-10-CM | POA: Insufficient documentation

## 2021-05-01 DIAGNOSIS — T83091A Other mechanical complication of indwelling urethral catheter, initial encounter: Secondary | ICD-10-CM | POA: Insufficient documentation

## 2021-05-01 DIAGNOSIS — Z7901 Long term (current) use of anticoagulants: Secondary | ICD-10-CM | POA: Diagnosis not present

## 2021-05-01 NOTE — ED Triage Notes (Signed)
Pt states that his catheter is clogged and he is having urinary retention since today.

## 2021-05-02 DIAGNOSIS — T83091A Other mechanical complication of indwelling urethral catheter, initial encounter: Secondary | ICD-10-CM | POA: Diagnosis not present

## 2021-05-02 LAB — URINALYSIS, ROUTINE W REFLEX MICROSCOPIC
Bacteria, UA: NONE SEEN
Bilirubin Urine: NEGATIVE
Glucose, UA: NEGATIVE mg/dL
Ketones, ur: NEGATIVE mg/dL
Nitrite: NEGATIVE
Protein, ur: 100 mg/dL — AB
RBC / HPF: >50 RBC/hpf — ABNORMAL HIGH (ref 0–5)
Specific Gravity, Urine: 1.012 (ref 1.005–1.030)
pH: 9 — ABNORMAL HIGH (ref 5.0–8.0)

## 2021-05-02 LAB — URINE CULTURE

## 2021-05-02 NOTE — ED Notes (Signed)
Pt had 21F foley which was not draining (last drained around 8p).  Pt has pink, cloudy drainage in his leg bag.  Attempt to flush foley without success.  Foley removed and placed new 21F foley with NT at bedside per protocol, immediate return of clear cool aid red urine.  Immediate return of 1000cc urine.

## 2021-05-02 NOTE — ED Notes (Signed)
Please note that pt had cloudy pinkish urine in leg bag on arrival, could not flush existing foley, removed existing foley and placed new 63F with 30cc balloon, urine returned which was red.  Pt has continued red urine and has blood clots in tubing and bag when I emptied the urine before he left. Leg bag applied and advised him to see urology in am.

## 2021-05-02 NOTE — ED Provider Notes (Signed)
[facility] DEPT Provider Note   CSN: [id number] Arrival date & time: 05/01/21  2237     History Chief Complaint  Patient presents with   Urinary Retention    Nathan [name] is a 75 y.o. male.  The history is provided by the patient and medical records.   75 y.o. F with hx of AFIB, prostate cancer w/chronic indwelling foley, HTN, presenting to the ED with obstructed foley catheter.  States it has been clogged for several hours today, no urine draining.  Starting to have pressure/pain in lower abdomen.  No fever/chills.  States otherwise feeling at baseline.  Past Medical History:  Diagnosis Date   Atrial fibrillation ([place] [facility] [place]) 01/2020   resolved now    Cancer of prostate ([facility]) 2020   Chronic anticoagulation    Foley catheter in place    will be changed 05-16-20   HTN (hypertension)    Lipoma of chest wall    Pneumonia 01/2020   and uti in hospital for 1 week   WPW (Wolff-Parkinson-White syndrome)    No prior ablation    Patient Active Problem List   Diagnosis Date Noted   Septic shock ([place]) 02/22/2020   Malignant neoplasm of prostate ([place]) 07/07/2019   Atrial fibrillation ([place]) 06/05/2019   Long term (current) use of anticoagulants 06/05/2019    Past Surgical History:  Procedure Laterality Date   CATARACT EXTRACTION Bilateral 2020   LIPOMA EXCISION Left 05/16/2020   Procedure: EXCISION LIPOMA LEFT CHEST WALL;  Surgeon: Kinsinger, Arta Bruce, MD;  Location: [facility];  Service: General;  Laterality: Left;   RADIOACTIVE SEED IMPLANT  2020       Family History  Problem Relation Age of Onset   Lung cancer Mother    Cervical cancer Sister    Breast cancer Neg Hx    Pancreatic cancer Neg Hx    Colon cancer Neg Hx     Social History   Tobacco Use   Smoking status: Former    Packs/day: 0.50    Years: 60.00    Pack years: 30.00    Types: Cigarettes    Quit date: 01/31/2019    Years since quitting: 2.2    Smokeless tobacco: Former  Scientific laboratory technician Use: Never used  Substance Use Topics   Alcohol use: Not Currently   Drug use: Never    Home Medications Prior to Admission medications   Medication Sig Start Date End Date Taking? Authorizing Provider  apixaban (ELIQUIS) 5 MG TABS tablet Take 1 tablet by mouth twice daily 01/09/21   Hilty, Nadean Corwin, MD  finasteride (PROSCAR) 5 MG tablet Take 5 mg by mouth daily.    [provider]  folic acid (FOLVITE) 1 MG tablet Take 1 mg by mouth daily. 03/24/19   [provider]  lisinopril (ZESTRIL) 40 MG tablet Take 40 mg by mouth daily. 09/24/20   [provider]  magnesium oxide (MAG-OX) 400 MG tablet Take 400 mg by mouth daily.  03/24/19   [provider]  metoprolol succinate (TOPROL-XL) 50 MG 24 hr tablet Take 50 mg by mouth daily. 08/16/20   [provider]  oxybutynin (DITROPAN-XL) 10 MG 24 hr tablet Take 10 mg by mouth daily. 07/07/20   [provider]  Prenatal Vit-Fe Fumarate-FA (PRENATAL MULTIVITAMIN) TABS tablet Take 1 tablet by mouth daily at 12 noon.    [provider]  vitamin B-12 (CYANOCOBALAMIN) 1000 MCG tablet Take 1,000 mcg by mouth daily.  [provider]  Vitamin D, Ergocalciferol, (DRISDOL) 1.25 MG (50000 UNIT) CAPS capsule Take 50,000 Units by mouth every Sunday.     [provider]    Allergies    Patient has no known allergies.  Review of Systems   Review of Systems  Genitourinary:        Clogged foley  All other systems reviewed and are negative.  Physical Exam Updated Vital Signs BP (!) 148/86   Pulse 66   Temp 99.3 F (37.4 C) (Oral)   Resp 16   SpO2 95%   Physical Exam Vitals and nursing note reviewed.  Constitutional:      Appearance: He is well-developed.  HENT:     Head: Normocephalic and atraumatic.  Eyes:     Conjunctiva/sclera: Conjunctivae normal.     Pupils: Pupils are equal, round, and reactive to light.   Cardiovascular:     Rate and Rhythm: Normal rate and regular rhythm.     Heart sounds: Normal heart sounds.  Pulmonary:     Effort: Pulmonary effort is normal. No respiratory distress.     Breath sounds: Normal breath sounds.  Abdominal:     General: Bowel sounds are normal.     Palpations: Abdomen is soft.  Genitourinary:    Comments: Foley exchanged-- freely draining clear/somewhat bloody urine Musculoskeletal:        General: Normal range of motion.     Cervical back: Normal range of motion.  Skin:    General: Skin is warm and dry.  Neurological:     Mental Status: He is alert and oriented to person, place, and time.    ED Results / Procedures / Treatments   Labs (all labs ordered are listed, but only abnormal results are displayed) Labs Reviewed  URINALYSIS, ROUTINE W REFLEX MICROSCOPIC - Abnormal; Notable for the following components:      Result Value   Color, Urine AMBER (*)    APPearance TURBID (*)    pH 9.0 (*)    Hgb urine dipstick MODERATE (*)    Protein, ur 100 (*)    Leukocytes,Ua MODERATE (*)    RBC / HPF >50 (*)    All other components within normal limits  URINE CULTURE    EKG None  Radiology No results found.  Procedures Procedures   Medications Ordered in ED Medications - No data to display  ED Course  I have reviewed the triage vital signs and the nursing notes.  Pertinent labs & imaging results that were available during my care of the patient were reviewed by me and considered in my medical decision making (see chart for details).    MDM Rules/Calculators/A&P                           75  year old male here with obstructed Foley catheter.  Chronically in place secondary to complications related to prostate cancer.  RN attempted to irrigate foley without success so this was exchanged and now freely draining.  Some hematuria noted, he is on eliquis.  So far about 750cc output, states he is feeling better.  UA with moderate leuks but no  nitrites, no bacteria seen.  Will send for culture.  Stable to discharge to follow-up with urology.  Can return here for new concerns.  Final Clinical Impression(s) / ED Diagnoses Final diagnoses:  Obstruction of Foley catheter, initial encounter [facility])    Rx / DC Orders ED Discharge Orders  None        Larene Pickett, PA-C 05/02/21 0211    Ezequiel Essex, MD 05/02/21 1031

## 2021-05-02 NOTE — ED Notes (Signed)
Pt is resting more comfortably, foley is still draining and urine is now red but clear. Pt states that he is feeling better

## 2021-05-02 NOTE — Discharge Instructions (Signed)
Your urine was sent for culture, you will be notified if this needs treatment. Follow-up with your urologist. Return here for new concerns-- foley not draining again, fever, etc.

## 2021-05-15 ENCOUNTER — Other Ambulatory Visit: Payer: Self-pay

## 2021-05-15 ENCOUNTER — Encounter (HOSPITAL_COMMUNITY): Payer: Self-pay

## 2021-05-15 ENCOUNTER — Emergency Department (HOSPITAL_COMMUNITY)
Admission: EM | Admit: 2021-05-15 | Discharge: 2021-05-15 | Disposition: A | Discharge status: Home / Self Care | Payer: Medicare Other | Attending: Emergency Medicine | Admitting: Emergency Medicine

## 2021-05-15 DIAGNOSIS — R1030 Lower abdominal pain, unspecified: Secondary | ICD-10-CM | POA: Diagnosis not present

## 2021-05-15 DIAGNOSIS — T83098A Other mechanical complication of other indwelling urethral catheter, initial encounter: Secondary | ICD-10-CM | POA: Insufficient documentation

## 2021-05-15 DIAGNOSIS — Z87891 Personal history of nicotine dependence: Secondary | ICD-10-CM | POA: Insufficient documentation

## 2021-05-15 DIAGNOSIS — Z7901 Long term (current) use of anticoagulants: Secondary | ICD-10-CM | POA: Insufficient documentation

## 2021-05-15 DIAGNOSIS — Z79899 Other long term (current) drug therapy: Secondary | ICD-10-CM | POA: Insufficient documentation

## 2021-05-15 DIAGNOSIS — I4891 Unspecified atrial fibrillation: Secondary | ICD-10-CM | POA: Diagnosis not present

## 2021-05-15 DIAGNOSIS — Z8546 Personal history of malignant neoplasm of prostate: Secondary | ICD-10-CM | POA: Insufficient documentation

## 2021-05-15 DIAGNOSIS — T83091A Other mechanical complication of indwelling urethral catheter, initial encounter: Secondary | ICD-10-CM

## 2021-05-15 DIAGNOSIS — R339 Retention of urine, unspecified: Secondary | ICD-10-CM | POA: Insufficient documentation

## 2021-05-15 DIAGNOSIS — I10 Essential (primary) hypertension: Secondary | ICD-10-CM | POA: Insufficient documentation

## 2021-05-15 LAB — URINALYSIS, MICROSCOPIC (REFLEX)
RBC / HPF: >50 RBC/hpf (ref 0–5)
Squamous Epithelial / HPF: NONE SEEN (ref 0–5)

## 2021-05-15 LAB — URINALYSIS, ROUTINE W REFLEX MICROSCOPIC

## 2021-05-15 NOTE — ED Provider Notes (Signed)
[facility] DEPT Provider Note   CSN: [id number] Arrival date & time: 05/15/21  1648     History Chief Complaint  Patient presents with   Urinary Retention    Cath blockage    Nathan [name] is a 75 y.o. male.  HPI     75yo male with history of atrial fibrillation, prostate cancern, htn, WPW, indwelling foley in place with 2 ED visits in the last month for obsrtuction presents with concern for obstruction of the foley.   Has suprapubic abdominal apin5/10.  Had BM and pain started around 2PM.  Stopped draining urine from the catheter.  Daughter attempted to irrigated catheter but was unable to.  No n,v, diarrhea, constipation, fevers, flank pain.  Past Medical History:  Diagnosis Date   Atrial fibrillation ([place]) 01/2020   resolved now    Cancer of prostate ([place]) 2020   Chronic anticoagulation    Foley catheter in place    will be changed 05-16-20   HTN (hypertension)    Lipoma of chest wall    Pneumonia 01/2020   and uti in hospital for 1 week   WPW (Wolff-Parkinson-White syndrome)    No prior ablation    Patient Active Problem List   Diagnosis Date Noted   Septic shock ([place]) 02/22/2020   Malignant neoplasm of prostate ([place]) 07/07/2019   Atrial fibrillation ([place]) 06/05/2019   Long term (current) use of anticoagulants 06/05/2019    Past Surgical History:  Procedure Laterality Date   CATARACT EXTRACTION Bilateral 2020   LIPOMA EXCISION Left 05/16/2020   Procedure: EXCISION LIPOMA LEFT CHEST WALL;  Surgeon: Kinsinger, Arta Bruce, MD;  Location: [facility];  Service: General;  Laterality: Left;   RADIOACTIVE SEED IMPLANT  2020       Family History  Problem Relation Age of Onset   Lung cancer Mother    Cervical cancer Sister    Breast cancer Neg Hx    Pancreatic cancer Neg Hx    Colon cancer Neg Hx     Social History   Tobacco Use   Smoking status: Former    Packs/day: 0.50    Years: 60.00    Pack  years: 30.00    Types: Cigarettes    Quit date: 01/31/2019    Years since quitting: 2.2   Smokeless tobacco: Former  Scientific laboratory technician Use: Never used  Substance Use Topics   Alcohol use: Not Currently   Drug use: Never    Home Medications Prior to Admission medications   Medication Sig Start Date End Date Taking? Authorizing Provider  apixaban (ELIQUIS) 5 MG TABS tablet Take 1 tablet by mouth twice daily 01/09/21   Hilty, Nadean Corwin, MD  finasteride (PROSCAR) 5 MG tablet Take 5 mg by mouth daily.    [provider]  folic acid (FOLVITE) 1 MG tablet Take 1 mg by mouth daily. 03/24/19   [provider]  lisinopril (ZESTRIL) 40 MG tablet Take 40 mg by mouth daily. 09/24/20   [provider]  magnesium oxide (MAG-OX) 400 MG tablet Take 400 mg by mouth daily.  03/24/19   [provider]  metoprolol succinate (TOPROL-XL) 50 MG 24 hr tablet Take 50 mg by mouth daily. 08/16/20   [provider]  oxybutynin (DITROPAN-XL) 10 MG 24 hr tablet Take 10 mg by mouth daily. 07/07/20   [provider]  Prenatal Vit-Fe Fumarate-FA (PRENATAL MULTIVITAMIN) TABS tablet Take 1 tablet by mouth daily at 12 noon.  [provider]  vitamin B-12 (CYANOCOBALAMIN) 1000 MCG tablet Take 1,000 mcg by mouth daily.    [provider]  Vitamin D, Ergocalciferol, (DRISDOL) 1.25 MG (50000 UNIT) CAPS capsule Take 50,000 Units by mouth every Sunday.     [provider]    Allergies    Patient has no known allergies.  Review of Systems   Review of Systems  Constitutional:  Negative for fever.  Respiratory:  Negative for shortness of breath.   Cardiovascular:  Negative for chest pain.  Gastrointestinal:  Positive for abdominal pain. Negative for constipation, diarrhea, nausea and vomiting.  Genitourinary:  Positive for difficulty urinating. Negative for flank pain.  Musculoskeletal:  Negative for back pain.  Skin:  Negative for rash.    Physical Exam Updated Vital Signs BP (!) 179/85 (BP Location: Right Arm)   Pulse 80   Temp 97.9 F (36.6 C) (Oral)   Resp 16   SpO2 96%   Physical Exam Vitals and nursing note reviewed.  Constitutional:      General: He is not in acute distress.    Appearance: Normal appearance. He is not ill-appearing, toxic-appearing or diaphoretic.  HENT:     Head: Normocephalic.  Eyes:     Conjunctiva/sclera: Conjunctivae normal.  Cardiovascular:     Rate and Rhythm: Normal rate and regular rhythm.     Pulses: Normal pulses.  Pulmonary:     Effort: Pulmonary effort is normal. No respiratory distress.  Abdominal:     Tenderness: There is abdominal tenderness (improved after catheter placement).  Musculoskeletal:        General: No deformity or signs of injury.     Cervical back: No rigidity.  Skin:    General: Skin is warm and dry.     Coloration: Skin is not jaundiced or pale.  Neurological:     General: No focal deficit present.     Mental Status: He is alert and oriented to person, place, and time.    ED Results / Procedures / Treatments   Labs (all labs ordered are listed, but only abnormal results are displayed) Labs Reviewed  URINALYSIS, ROUTINE W REFLEX MICROSCOPIC - Abnormal; Notable for the following components:      Result Value   Color, Urine RED (*)    APPearance TURBID (*)    Glucose, UA   (*)    Value: TEST NOT REPORTED DUE TO COLOR INTERFERENCE OF URINE PIGMENT   Hgb urine dipstick   (*)    Value: TEST NOT REPORTED DUE TO COLOR INTERFERENCE OF URINE PIGMENT   Bilirubin Urine   (*)    Value: TEST NOT REPORTED DUE TO COLOR INTERFERENCE OF URINE PIGMENT   Ketones, ur   (*)    Value: TEST NOT REPORTED DUE TO COLOR INTERFERENCE OF URINE PIGMENT   Protein, ur   (*)    Value: TEST NOT REPORTED DUE TO COLOR INTERFERENCE OF URINE PIGMENT   Nitrite   (*)    Value: TEST NOT REPORTED DUE TO COLOR INTERFERENCE OF URINE PIGMENT   Leukocytes,Ua   (*)    Value: TEST NOT  REPORTED DUE TO COLOR INTERFERENCE OF URINE PIGMENT   All other components within normal limits  URINALYSIS, MICROSCOPIC (REFLEX) - Abnormal; Notable for the following components:   Bacteria, UA FEW (*)    All other components within normal limits  URINE CULTURE    EKG None  Radiology No results found.  Procedures Procedures   Medications Ordered in ED  Medications - No data to display  ED Course  I have reviewed the triage vital signs and the nursing notes.  Pertinent labs & imaging results that were available during my care of the patient were reviewed by me and considered in my medical decision making (see chart for details).    MDM Rules/Calculators/A&P                            75yo male with history of atrial fibrillation, prostate cancern, htn, WPW, indwelling foley in place with 2 ED visits in the last month for obsrtuction presents with concern for obstruction of the foley. 3rd visit since middle of October for same. Irrigation had been attempted each time, attempted with daughter.   Foley catheter replaced.  Feels resolution of symptoms following placement.  Do not feel labs will acutely change management at this time.  UA with blood, not clearly infected, will send for culture.  Discussed with Urology, will follow up with Dr. Alinda Money. Patient discharged in stable condition with understanding of reasons to return.  Final Clinical Impression(s) / ED Diagnoses Final diagnoses:  Urinary retention  Obstruction of Foley catheter, initial encounter [facility])    Rx / DC Orders ED Discharge Orders     None        Gareth Morgan, MD 05/16/21 1224

## 2021-05-15 NOTE — Discharge Instructions (Signed)
We did send your urine for culture. At this time it appears to have blood but does not classically appear to be infected, will send for culture and call if it is positive.

## 2021-05-15 NOTE — ED Provider Notes (Signed)
Emergency Medicine Provider Triage Evaluation Note  Starr Engel , a 75 y.o. male  was evaluated in triage.  Pt complains of urinary retention. Foley cath not draining. Last empty 12pm.   Review of Systems  Positive: Urinary retention Negative: Abdominal pain, fever, chills  Physical Exam  BP (!) 153/103 (BP Location: Left Arm)   Pulse 76   Temp 97.9 F (36.6 C) (Oral)   Resp 18   SpO2 98%  Gen:   Awake, no distress   Resp:  Normal effort  MSK:   Moves extremities without difficulty  Other:    Medical Decision Making  Medically screening exam initiated at 5:16 PM.  Appropriate orders placed.  Daryn Hicks was informed that the remainder of the evaluation will be completed by another provider, this initial triage assessment does not replace that evaluation, and the importance of remaining in the ED until their evaluation is complete.     Evlyn Courier, PA-C 05/15/21 1717    Milton Ferguson, MD 05/19/21 [phone]

## 2021-05-15 NOTE — ED Triage Notes (Signed)
Pt brought in by daughter for catheter blockage and pain that started about 2pm after having a bowel movement. Daughter reports she irrigated the cath with no success. Daughter reports frequent catheter blockages in the past few weeks.   5/10 pain   A/ox4 Ambulatory in triage

## 2021-05-18 LAB — URINE CULTURE: Culture: >=100000 — AB

## 2021-05-19 ENCOUNTER — Telehealth: Payer: Self-pay | Admitting: Emergency Medicine

## 2021-05-19 NOTE — Telephone Encounter (Signed)
Post ED Visit - Positive Culture Follow-up  Culture report reviewed by antimicrobial stewardship pharmacist: [facility] Team [x]  Elenor Quinones, Pharm.D. []  Heide [name], Pharm.D., BCPS AQ-ID []  Parks Neptune, Pharm.D., BCPS []  Alycia Rossetti, Pharm.D., BCPS []  [place], Pharm.D., BCPS, AAHIVP []  Legrand Como, Pharm.D., BCPS, AAHIVP []  Salome Arnt, PharmD, BCPS []  Johnnette Gourd, PharmD, BCPS []  Hughes Better, PharmD, BCPS []  Leeroy [name], PharmD []  Laqueta Linden, PharmD, BCPS []  Albertina Parr, PharmD  [facility] Team []  Leodis Sias, PharmD []  Lindell Spar, PharmD []  Royetta Asal, PharmD []  Graylin Shiver, Rph []  Rema Fendt) Glennon Mac, PharmD []  Arlyn Dunning, PharmD []  Netta Cedars, PharmD []  Dia Sitter, PharmD []  Leone Haven, PharmD []  Gretta Arab, PharmD []  Theodis Shove, PharmD []  Peggyann Juba, PharmD []  Reuel Boom, PharmD   Positive urine culture No further patient follow-up is required at this time.  Sandi Raveling Laniece Hornbaker 05/19/2021, 9:58 AM

## 2021-05-19 NOTE — Progress Notes (Signed)
ED Antimicrobial Stewardship Positive Culture Follow Up   Nathan [name] is an 75 y.o. male who presented to [facility] on 05/15/2021 with a chief complaint of  Chief Complaint  Patient presents with   Urinary Retention    Cath blockage    Recent Results (from the past 720 hour(s))  Urine Culture     Status: Abnormal   Collection Time: 05/02/21 12:44 AM   Specimen: Urine, Clean Catch  Result Value Ref Range Status   Specimen Description   Final    URINE, CLEAN CATCH Performed at [facility], [place] [address] [place], [place] [address]    Special Requests   Final    NONE Performed at [facility], [place] [address] [place], [place] [address]    Culture MULTIPLE SPECIES PRESENT, SUGGEST RECOLLECTION (A)  Final   Report Status 05/02/2021 FINAL  Final  Urine Culture     Status: Abnormal   Collection Time: 05/15/21  7:20 PM   Specimen: Urine, Catheterized  Result Value Ref Range Status   Specimen Description   Final    URINE, CATHETERIZED Performed at [facility] [address], [place], [place] [address]    Special Requests   Final    NONE Performed at [facility], [place] [address] [place], [place] [address]    Culture >=100,000 COLONIES/mL ENTEROCOCCUS FAECALIS (A)  Final   Report Status 05/18/2021 FINAL  Final   Organism ID, Bacteria ENTEROCOCCUS FAECALIS (A)  Final      Susceptibility   Enterococcus faecalis - MIC*    AMPICILLIN <=2 SENSITIVE Sensitive     NITROFURANTOIN <=16 SENSITIVE Sensitive     VANCOMYCIN 2 SENSITIVE Sensitive     * >=100,000 COLONIES/mL ENTEROCOCCUS FAECALIS   Presented with catheter blockage. Symptoms of suprapubic pain and tenderness were relieved after new catheter placement. No other issues at this time.  Plan: No treatment necessary. Plan to follow up with Dr. Alinda Money.  ED Provider: Quentin Mulling PA-C  Elenor Quinones, PharmD, BCPS, BCIDP Clinical  Pharmacist 05/19/2021 8:22 AM

## 2021-05-27 ENCOUNTER — Emergency Department (HOSPITAL_COMMUNITY)
Admission: EM | Admit: 2021-05-27 | Discharge: 2021-05-27 | Disposition: A | Discharge status: Home / Self Care | Payer: Medicare Other | Attending: Emergency Medicine | Admitting: Emergency Medicine

## 2021-05-27 ENCOUNTER — Other Ambulatory Visit: Payer: Self-pay

## 2021-05-27 DIAGNOSIS — Z466 Encounter for fitting and adjustment of urinary device: Secondary | ICD-10-CM | POA: Diagnosis present

## 2021-05-27 DIAGNOSIS — I4891 Unspecified atrial fibrillation: Secondary | ICD-10-CM | POA: Insufficient documentation

## 2021-05-27 DIAGNOSIS — Z7901 Long term (current) use of anticoagulants: Secondary | ICD-10-CM | POA: Insufficient documentation

## 2021-05-27 DIAGNOSIS — I10 Essential (primary) hypertension: Secondary | ICD-10-CM | POA: Insufficient documentation

## 2021-05-27 DIAGNOSIS — Z87891 Personal history of nicotine dependence: Secondary | ICD-10-CM | POA: Diagnosis not present

## 2021-05-27 DIAGNOSIS — Z79899 Other long term (current) drug therapy: Secondary | ICD-10-CM | POA: Insufficient documentation

## 2021-05-27 DIAGNOSIS — Z8546 Personal history of malignant neoplasm of prostate: Secondary | ICD-10-CM | POA: Insufficient documentation

## 2021-05-27 LAB — URINALYSIS, ROUTINE W REFLEX MICROSCOPIC
Bilirubin Urine: NEGATIVE
Glucose, UA: NEGATIVE mg/dL
Ketones, ur: 20 mg/dL — AB
Nitrite: NEGATIVE
Protein, ur: 100 mg/dL — AB
RBC / HPF: >50 RBC/hpf — ABNORMAL HIGH (ref 0–5)
Specific Gravity, Urine: 1.009 (ref 1.005–1.030)
WBC, UA: >50 WBC/hpf — ABNORMAL HIGH (ref 0–5)
pH: 7 (ref 5.0–8.0)

## 2021-05-27 MED ORDER — STERILE WATER FOR INJECTION IJ SOLN
INTRAMUSCULAR | Status: AC
  Administered 2021-05-27: 20 mL
  Filled 2021-05-27: qty 20

## 2021-05-27 MED ORDER — CEPHALEXIN 500 MG PO CAPS: 500.0000 mg | ORAL_CAPSULE | Freq: Four times a day (QID) | ORAL | 0 refills | Status: AC

## 2021-05-27 MED ORDER — CEPHALEXIN 500 MG PO CAPS: 500.0000 mg | ORAL_CAPSULE | Freq: Four times a day (QID) | ORAL | 0 refills | Status: DC

## 2021-05-27 NOTE — Discharge Instructions (Addendum)
Nathan [name] was seen in the ER today for his catheter issue.  His Foley was placed in the ER with significant relief of his retained urine.  Please follow-up with urology as scheduled for Monday 11/28.  Additionally given the discharge from his urine, which is new for him, he has been started on an antibiotic to treat a potential infection.  Please discuss a visit with urology at his appointment.  Return to the ER with any fevers, chills, nausea or vomiting that does not stop, or any other new severe symptom.

## 2021-05-27 NOTE — ED Triage Notes (Signed)
Patient reports catheter is blocked, has had catheter two years, pain 9/10

## 2021-05-27 NOTE — ED Provider Notes (Signed)
[facility] DEPT Provider Note   CSN: [id number] Arrival date & time: 05/27/21  1232     History Chief Complaint  Patient presents with   urinary catheter blockage    Nathan [name] is a 75 y.o. male who presents with family at the bedside with concern for blockage of his Foley catheter since 10 AM today.  States that he has a chronic Foley for prostate cancer. At baseline has a significant blood in his urine  History of blockages in the past from blood clots. Following with urology on Monday.  Feels that his abdomen is distended, painful, he is very uncomfortable.  No fevers or chills at home.  I have personally reviewed this patient's medical records. He has history of prostate cancer following with urology. Hx of afib, WPW, anticoagulated on eliquis.   HPI     Past Medical History:  Diagnosis Date   Atrial fibrillation ([place]) 01/2020   resolved now    Cancer of prostate ([place]) 2020   Chronic anticoagulation    Foley catheter in place    will be changed 05-16-20   HTN (hypertension)    Lipoma of chest wall    Pneumonia 01/2020   and uti in hospital for 1 week   WPW (Wolff-Parkinson-White syndrome)    No prior ablation    Patient Active Problem List   Diagnosis Date Noted   Septic shock ([place]) 02/22/2020   Malignant neoplasm of prostate ([place]) 07/07/2019   Atrial fibrillation ([place]) 06/05/2019   Long term (current) use of anticoagulants 06/05/2019    Past Surgical History:  Procedure Laterality Date   CATARACT EXTRACTION Bilateral 2020   LIPOMA EXCISION Left 05/16/2020   Procedure: EXCISION LIPOMA LEFT CHEST WALL;  Surgeon: Kinsinger, Arta Bruce, MD;  Location: [facility];  Service: General;  Laterality: Left;   RADIOACTIVE SEED IMPLANT  2020       Family History  Problem Relation Age of Onset   Lung cancer Mother    Cervical cancer Sister    Breast cancer Neg Hx    Pancreatic cancer Neg Hx    Colon cancer Neg Hx      Social History   Tobacco Use   Smoking status: Former    Packs/day: 0.50    Years: 60.00    Pack years: 30.00    Types: Cigarettes    Quit date: 01/31/2019    Years since quitting: 2.3   Smokeless tobacco: Former  Scientific laboratory technician Use: Never used  Substance Use Topics   Alcohol use: Not Currently   Drug use: Never    Home Medications Prior to Admission medications   Medication Sig Start Date End Date Taking? Authorizing Provider  cephALEXin (KEFLEX) 500 MG capsule Take 1 capsule (500 mg total) by mouth 4 (four) times daily for 7 days. 05/27/21 06/03/21 Yes Kadia Abaya, Gypsy Balsam, PA-C  apixaban (ELIQUIS) 5 MG TABS tablet Take 1 tablet by mouth twice daily 01/09/21   Hilty, Nadean Corwin, MD  finasteride (PROSCAR) 5 MG tablet Take 5 mg by mouth daily.    [provider]  folic acid (FOLVITE) 1 MG tablet Take 1 mg by mouth daily. 03/24/19   [provider]  lisinopril (ZESTRIL) 40 MG tablet Take 40 mg by mouth daily. 09/24/20   [provider]  magnesium oxide (MAG-OX) 400 MG tablet Take 400 mg by mouth daily.  03/24/19   [provider]  metoprolol succinate (TOPROL-XL) 50 MG 24 hr  tablet Take 50 mg by mouth daily. 08/16/20   [provider]  oxybutynin (DITROPAN-XL) 10 MG 24 hr tablet Take 10 mg by mouth daily. 07/07/20   [provider]  Prenatal Vit-Fe Fumarate-FA (PRENATAL MULTIVITAMIN) TABS tablet Take 1 tablet by mouth daily at 12 noon.    [provider]  vitamin B-12 (CYANOCOBALAMIN) 1000 MCG tablet Take 1,000 mcg by mouth daily.    [provider]  Vitamin D, Ergocalciferol, (DRISDOL) 1.25 MG (50000 UNIT) CAPS capsule Take 50,000 Units by mouth every Sunday.     [provider]    Allergies    Patient has no known allergies.  Review of Systems   Review of Systems  Constitutional: Negative.   HENT: Negative.    Respiratory: Negative.  Negative for shortness of breath.   Genitourinary:   Positive for decreased urine volume.       Foley obstruction  Neurological: Negative.  Negative for weakness.   Physical Exam Updated Vital Signs BP (!) 174/117 (BP Location: Left Arm)   Pulse 90   Temp 97.8 F (36.6 C) (Oral)   Ht 6\' 1"  (1.854 m)   Wt 79.8 kg   SpO2 95%   BMI 23.22 kg/m   Physical Exam Vitals and nursing note reviewed. Exam conducted with a chaperone present.  Constitutional:      Appearance: He is not ill-appearing or toxic-appearing.  HENT:     Head: Normocephalic and atraumatic.     Nose: Nose normal. No congestion.     Mouth/Throat:     Mouth: Mucous membranes are moist.     Pharynx: No oropharyngeal exudate or posterior oropharyngeal erythema.  Eyes:     General:        Right eye: No discharge.        Left eye: No discharge.     Extraocular Movements: Extraocular movements intact.     Conjunctiva/sclera: Conjunctivae normal.     Pupils: Pupils are equal, round, and reactive to light.  Cardiovascular:     Rate and Rhythm: Normal rate and regular rhythm.     Pulses: Normal pulses.     Heart sounds: Normal heart sounds. No murmur heard. Pulmonary:     Effort: Pulmonary effort is normal. No respiratory distress.     Breath sounds: Normal breath sounds. No wheezing or rales.  Abdominal:     General: Bowel sounds are normal. There is no distension.     Palpations: Abdomen is soft.     Tenderness: There is no abdominal tenderness. There is no right CVA tenderness, left CVA tenderness, guarding or rebound.  Genitourinary:    Penis: Circumcised. Discharge present.      Testes: Normal.     Comments: Foley catheter present in the uterine.  Yellowish discharge coming from around the Foley from according to patient this is new for him.  Once Foley was replaced, significant output of urine in the Foley catheter bag greater than 500 cc, additionally patient with blood in the Foley catheter bag, per daughter at the bedside this is his  baseline. Musculoskeletal:        General: No deformity.     Cervical back: Neck supple.     Right lower leg: No edema.     Left lower leg: No edema.  Lymphadenopathy:     Cervical: No cervical adenopathy.  Skin:    General: Skin is warm and dry.     Capillary Refill: Capillary refill takes less than 2 seconds.  Neurological:     General: No focal deficit present.     Mental Status: He is alert and oriented to person, place, and time. Mental status is at baseline.  Psychiatric:        Mood and Affect: Mood normal.    ED Results / Procedures / Treatments   Labs (all labs ordered are listed, but only abnormal results are displayed) Labs Reviewed  URINALYSIS, ROUTINE W REFLEX MICROSCOPIC - Abnormal; Notable for the following components:      Result Value   APPearance CLOUDY (*)    Hgb urine dipstick MODERATE (*)    Ketones, ur 20 (*)    Protein, ur 100 (*)    Leukocytes,Ua LARGE (*)    RBC / HPF >50 (*)    WBC, UA >50 (*)    Bacteria, UA RARE (*)    All other components within normal limits  URINE CULTURE    EKG None  Radiology No results found.  Procedures Procedures   Medications Ordered in ED Medications  sterile water (preservative free) injection (20 mLs  Given 05/27/21 1414)    ED Course  I have reviewed the triage vital signs and the nursing notes.  Pertinent labs & imaging results that were available during my care of the patient were reviewed by me and considered in my medical decision making (see chart for details).    MDM Rules/Calculators/A&P                         75 year old male with prostate cancer who presents with obstruction of foley catheter.   Hypertensive on intake, VS otherwise normal. Cardiopulmonary exam is normal, abdominal exam is significant for suprapubic TTP, distension. Afebrile.  Milky yellow to white discharge coming from the urethra, according to patient this is new.  Catheter was replaced with significant improvement in  patient's abdominal pain, no longer tender to palpation on exam.  New catheter continues to drain urine, hematuria per daughter at baseline.  UA with ketones, protein, rare bacteria, however given new discharge will prescribe cephalexin 4 times daily for 1 week in the outpatient setting.  Recommend urologic follow-up as previously scheduled for 05/29/2021.  No further work-up or to the ER at this time as patient remains well-appearing, improved hypertension after release of urine, afebrile, and no other systemic symptoms.  Venkat voiced understanding of his medical evaluation and treatment plan.  Issues or questions was answered to his expresses fashion.  Return precautions were given.  Patient is well-appearing, stable, and appropriate for discharge at this time.  This chart was dictated using voice recognition software, Dragon. Despite the best efforts of this provider to proofread and correct errors, errors may still occur which can change documentation meaning.   Final Clinical Impression(s) / ED Diagnoses Final diagnoses:  Encounter for Foley catheter replacement    Rx / DC Orders ED Discharge Orders          Ordered    cephALEXin (KEFLEX) 500 MG capsule  4 times daily,   Status:  Discontinued        05/27/21 1413    cephALEXin (KEFLEX) 500 MG capsule  4 times daily        05/27/21 1427             Eisha Chatterjee, Gypsy Balsam, PA-C 05/27/21 1710    Jeanell Sparrow, DO 05/28/21 1614

## 2021-05-29 ENCOUNTER — Other Ambulatory Visit: Payer: Self-pay | Admitting: Urology

## 2021-05-29 LAB — URINE CULTURE: Culture: >=100000 — AB

## 2021-05-30 ENCOUNTER — Encounter (HOSPITAL_COMMUNITY): Payer: Self-pay

## 2021-05-30 ENCOUNTER — Telehealth: Payer: Self-pay | Admitting: Emergency Medicine

## 2021-05-30 ENCOUNTER — Encounter (HOSPITAL_COMMUNITY)
Admission: RE | Admit: 2021-05-30 | Discharge: 2021-05-30 | Disposition: A | Discharge status: Home / Self Care | Payer: Medicare Other | Source: Ambulatory Visit | Attending: Urology | Admitting: Urology

## 2021-05-30 ENCOUNTER — Other Ambulatory Visit: Payer: Self-pay

## 2021-05-30 VITALS — BP 151/85 | HR 72 | Temp 99.2°F | Resp 16 | Ht 73.0 in | Wt 183.0 lb

## 2021-05-30 DIAGNOSIS — I10 Essential (primary) hypertension: Secondary | ICD-10-CM | POA: Diagnosis not present

## 2021-05-30 DIAGNOSIS — Z01818 Encounter for other preprocedural examination: Secondary | ICD-10-CM | POA: Diagnosis present

## 2021-05-30 DIAGNOSIS — E139 Other specified diabetes mellitus without complications: Secondary | ICD-10-CM | POA: Diagnosis not present

## 2021-05-30 HISTORY — DX: Type 2 diabetes mellitus without complications: E11.9

## 2021-05-30 HISTORY — DX: Cardiac arrhythmia, unspecified: I49.9

## 2021-05-30 HISTORY — DX: Angina pectoris, unspecified: I20.9

## 2021-05-30 LAB — CBC
HCT: 40.9 % (ref 39.0–52.0)
Hemoglobin: 13 g/dL (ref 13.0–17.0)
MCH: 29 pg (ref 26.0–34.0)
MCHC: 31.8 g/dL (ref 30.0–36.0)
MCV: 91.3 fL (ref 80.0–100.0)
Platelets: 194 10*3/uL (ref 150–400)
RBC: 4.48 MIL/uL (ref 4.22–5.81)
RDW: 13 % (ref 11.5–15.5)
WBC: 3.6 10*3/uL — ABNORMAL LOW (ref 4.0–10.5)
nRBC: 0 % (ref 0.0–0.2)

## 2021-05-30 LAB — BASIC METABOLIC PANEL
Anion gap: 6 (ref 5–15)
BUN: 11 mg/dL (ref 8–23)
CO2: 29 mmol/L (ref 22–32)
Calcium: 9.1 mg/dL (ref 8.9–10.3)
Chloride: 105 mmol/L (ref 98–111)
Creatinine, Ser: 1.07 mg/dL (ref 0.61–1.24)
GFR, Estimated: >60 mL/min (ref >60)
Glucose, Bld: 92 mg/dL (ref 70–99)
Potassium: 3.9 mmol/L (ref 3.5–5.1)
Sodium: 140 mmol/L (ref 135–145)

## 2021-05-30 LAB — GLUCOSE, CAPILLARY: Glucose-Capillary: 100 mg/dL — ABNORMAL HIGH (ref 70–99)

## 2021-05-30 NOTE — Progress Notes (Addendum)
PCP - Dr. Horald Pollen   lov 05-09-21 requested [place]  04-13-20 LOV epic  PPM/ICD -  Device Orders -  Rep Notified -   Chest x-ray -  EKG -  Stress Test -  ECHO -  Cardiac Cath -   Sleep Study -  CPAP -   Fasting Blood Sugar -  Checks Blood Sugar _____ times a day  Blood Thinner Instructions:Elequis last dose 05-03-21 Aspirin Instructions:  ERAS Protcol - PRE-SURGERY Ensure or G2-   COVID TEST- N/A COVID vaccine -no  Activity-- Able to walk around home without SOB Anesthesia review: Afib , WPW  Patient denies shortness of breath, fever, cough and chest pain at PAT appointment   All instructions explained to the patient, with a verbal understanding of the material. Patient agrees to go over the instructions while at home for a better understanding. Patient also instructed to self quarantine after being tested for COVID-19. The opportunity to ask questions was provided.

## 2021-05-30 NOTE — Telephone Encounter (Signed)
Post ED Visit - Positive Culture Follow-up: Successful Patient Follow-Up  Culture assessed and recommendations reviewed by:  []  Elenor Quinones, Pharm.D. [x]  Heide [name], Pharm.D., BCPS AQ-ID []  Parks Neptune, Pharm.D., BCPS []  Alycia Rossetti, Pharm.D., BCPS []  [place], Pharm.D., BCPS, AAHIVP []  Legrand Como, Pharm.D., BCPS, AAHIVP []  Salome Arnt, PharmD, BCPS []  Johnnette Gourd, PharmD, BCPS []  Hughes Better, PharmD, BCPS []  Leeroy [name], PharmD  Positive urine culture  []  Patient discharged without antimicrobial prescription and treatment is now indicated [x]  Organism is resistant to prescribed ED discharge antimicrobial []  Patient with positive blood cultures  Changes discussed with ED provider: Dr Marye Round New antibiotic prescription stop cephalexin d/t resistant, symptom check, if + symptoms start amoxicillin 500mg  po bid x 5 days Patient already saw urologist who stopped cephalexin and started amoxicillin, no further treatment needed  Contacted daughter date 05/30/2021, time 1112   Hazle Nordmann 05/30/2021, 11:10 AM

## 2021-05-30 NOTE — Patient Instructions (Addendum)
DUE TO COVID-19 ONLY ONE VISITOR IS ALLOWED TO COME WITH YOU AND STAY IN THE WAITING ROOM ONLY DURING PRE OP AND PROCEDURE DAY OF SURGERY.   Up to two visitors ages 16+ are allowed at one time in a patient's room.  The visitors may rotate out with other people throughout the day.  Additionally, up to two children between the ages of 81 and 35 are allowed and do not count toward the number of allowed visitors.  Children within this age range must be accompanied by an adult visitor.  One adult visitor may remain with the patient overnight and must be in the room by 8 PM.           Your procedure is scheduled on: 06-05-21   Report to [facility] Main  Entrance   Report to admitting at       1100  AM     Call this number if you have problems the morning of surgery [phone]   Remember: Do not eat food :After Midnight.  You may have clear liquids until 1000 am then nothing by mouth     CLEAR LIQUID DIET                                                                    water Black Coffee and tea, regular and decaf No Creamer                            Plain Jell-O any favor except red or purple                                  Fruit ices (not with fruit pulp)                                      Iced Popsicles                                     Carbonated beverages, regular and diet                                    Cranberry, grape and apple juices Sports drinks like Gatorade Lightly seasoned clear broth or consume(fat free) Sugar, honey syrup   _____________________________________________________________________     BRUSH YOUR TEETH MORNING OF SURGERY AND RINSE YOUR MOUTH OUT, NO CHEWING GUM CANDY OR MINTS.     Take these medicines the morning of surgery with A SIP OF WATER: metoprolol, finasteride, amoxicillin, oxybutin                               You may not have any metal on your body including hair pins and              piercings  Do not wear jewelry,  lotions, powders,perfumes,  deodorant                         Men may shave face and neck.   Do not bring valuables to the hospital. [place].  Contacts, dentures or bridgework may not be worn into surgery.       Patients discharged the day of surgery will not be allowed to drive home. IF YOU ARE HAVING SURGERY AND GOING HOME THE SAME DAY, YOU MUST HAVE AN ADULT TO DRIVE YOU HOME AND BE WITH YOU FOR 24 HOURS. YOU MAY GO HOME BY TAXI OR UBER OR ORTHERWISE, BUT AN ADULT MUST ACCOMPANY YOU HOME AND STAY WITH YOU FOR 24 HOURS.  Name and phone number of your driver:  Special Instructions: N/A              Please read over the following fact sheets you were given: _____________________________________________________________________             [facility] - Preparing for Surgery Before surgery, you can play an important role.  Because skin is not sterile, your skin needs to be as free of germs as possible.  You can reduce the number of germs on your skin by washing with CHG (chlorahexidine gluconate) soap before surgery.  CHG is an antiseptic cleaner which kills germs and bonds with the skin to continue killing germs even after washing. Please DO NOT use if you have an allergy to CHG or antibacterial soaps.  If your skin becomes reddened/irritated stop using the CHG and inform your nurse when you arrive at Short Stay. Do not shave (including legs and underarms) for at least 48 hours prior to the first CHG shower.  You may shave your face/neck. Please follow these instructions carefully:  1.  Shower with CHG Soap the night before surgery and the  morning of Surgery.  2.  If you choose to wash your hair, wash your hair first as usual with your  normal  shampoo.  3.  After you shampoo, rinse your hair and body thoroughly to remove the  shampoo.                           4.  Use CHG as you would any other liquid soap.  You can apply chg  directly  to the skin and wash                       Gently with a scrungie or clean washcloth.  5.  Apply the CHG Soap to your body ONLY FROM THE NECK DOWN.   Do not use on face/ open                           Wound or open sores. Avoid contact with eyes, ears mouth and genitals (private parts).                       Wash face,  Genitals (private parts) with your normal soap.             6.  Wash thoroughly, paying special attention to the area where your surgery  will be performed.  7.  Thoroughly rinse your body with warm water from the neck down.  8.  DO NOT shower/wash with your normal soap after using and rinsing off  the CHG Soap.                9.  Pat yourself dry with a clean towel.            10.  Wear clean pajamas.            11.  Place clean sheets on your bed the night of your first shower and do not  sleep with pets. Day of Surgery : Do not apply any lotions/deodorants the morning of surgery.  Please wear clean clothes to the hospital/surgery center.  FAILURE TO FOLLOW THESE INSTRUCTIONS MAY RESULT IN THE CANCELLATION OF YOUR SURGERY PATIENT SIGNATURE_________________________________  NURSE SIGNATURE__________________________________  ________________________________________________________________________

## 2021-06-01 LAB — HEMOGLOBIN A1C
Hgb A1c MFr Bld: 5.9 % — ABNORMAL HIGH (ref 4.8–5.6)
Mean Plasma Glucose: 123 mg/dL

## 2021-06-02 NOTE — H&P (Signed)
Office Visit Report     05/29/2021   --------------------------------------------------------------------------------   Nathan [name]  MRN: [id number]  DOB: Jun 27, 1946, 75 year old Male  SSN:    PRIMARY CARE:  Horald Pollen, MD  REFERRING:  Raynelle Bring, Eduardo Osier  PROVIDER:  Raynelle Bring, M.D.  LOCATION:  [facility] [phone]     --------------------------------------------------------------------------------   CC/HPI: 1. Prostate cancer  2. Urinary retention  3. Hematuria   Mr. Nathan [name] returns today after multiple trips to the emergency room over the last couple weeks due to intermittent hematuria and clot retention. He has previously undergone an evaluation and it is felt that this has been related to his very large prostate, history of prostate radiation, and anticoagulation with Eliquis. At this time, he is interested in changing management from a chronic urethral catheter to a chronic suprapubic tube. He denies any recent chest pain or shortness of breath. His culture from the emergency department was positive for Enterococcus. He has been on Keflex for the last couple of days. He denies any fever.     ALLERGIES: NKDA    MEDICATIONS: Diazepam 10 mg tablet 1 tablet PO 30-60 minutes prior to procedure  Finasteride 5 mg tablet 1 tablet PO Daily  Lisinopril 20 mg tablet  Oxybutynin Chloride Er 10 mg tablet, extended release 24 hr 1 tablet PO Daily  Tamsulosin Hcl 0.4 mg capsule  Eliquis  Nifedipine Er 60 mg tablet, extended release  Vitamin B12  Vitamin D3     GU PSH: Complex cystometrogram, w/ void pressure and urethral pressure profile studies, any technique - 2020 Complex Uroflow - 2020 Cystoscopy - 06/16/2019 Emg surf Electrd - 2020 Inject For cystogram - 2020 Intrabd voidng Press - 2020 Locm 300-399Mg /Ml Iodine,1Ml - 2020 PLACE RT DEVICE/MARKER, PROS - 2021 Prostate Needle Biopsy - 2020 Transperineal Plmt Biodegradable Matrl 1/Mlt Njx -  2021     NON-GU PSH: Surgical Pathology, Gross And Microscopic Examination For Prostate Needle - 2020     GU PMH: Gross hematuria (Stable) - 05/02/2021, - 02/08/2021, - 07/22/2020, - 04/22/2020, - 04/14/2020, - 2020 Urinary Retention - 05/02/2021, - 03/07/2021, - 02/08/2021, - 01/17/2021, - 10/18/2020, - 09/12/2020, - 08/16/2020, - 08/04/2020, - 07/22/2020, - 06/22/2020, - 04/22/2020, - 04/14/2020, - 03/24/2020, - 2020 BPH w/LUTS - 04/04/2021, - 12/13/2020, - 11/15/2020, - 05/16/2020, - 04/22/2020, - 2020 Prostate Cancer - 03/07/2021, - 02/08/2021, - 07/22/2020, - 04/22/2020, - 2020 BPH w/o LUTS - 02/08/2021, - 07/22/2020 Elevated PSA - 2020 Prostate nodule w/o LUTS - 2020      PMH Notes:   1) Urinary retention/BPH: He developed acute renal failure due to urinary retention with over 2 L in his bladder in [place] in 2020. His daughter moved him to [facility] to live with her and get him medical care as he had not been receiving routine preventive care. His Cr stabilized at 1.5 after catheter placement. We discussed options for management including suprapubic tube placement but he elected to continue with urethral catheter management.   Oct 2020: Urodynamics - Lack of sensation, no detrusor contraction, Volume of > 1 L   2) Prostate cancer: He was noted to have a firm left lobe of the prostate and his PSA was elevated at 12.2 prompting a TRUS biopsy on 04/28/19. This confirmed Gleason 3+4=7 adenocarcinoma with 8 out of 12 biopsy cores positive for cancer including all 6 cores on the left side. He is s/p treatment with IMRT completed in May 2021.  3) Hematuria: He is status post a complete urologic evaluation in January 2021 including CT imaging and cystoscopy. At that time, he had some nonobstructing renal calculi. He had no evidence of bladder tumors. His bleeding appeared to be related to his very large prostate.   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 He also has hypertension, diabetes, depression, and a history of alcohol abuse in the past.    NON-GU PMH: Other mechanical complication of other urinary catheter, subsequent encounter - 08/04/2020 Atrial Fibrillation Depression Diabetes Type 2 Hypertension    FAMILY HISTORY: 3 daughters - Daughter 6 sons - Son   SOCIAL HISTORY: Marital Status: Married Preferred Language: English; Ethnicity: Not Hispanic Or Latino; Race: Black or African American Current Smoking Status: Patient does not smoke anymore.   Tobacco Use Assessment Completed: Used Tobacco in last 30 days? Does not drink anymore.  Does not drink caffeine.    REVIEW OF SYSTEMS:    GU Review Male:   Patient denies frequent urination, hard to postpone urination, burning/ pain with urination, get up at night to urinate, leakage of urine, stream starts and stops, trouble starting your streams, and have to strain to urinate .  Gastrointestinal (Lower):   Patient denies diarrhea and constipation.  Gastrointestinal (Upper):   Patient denies nausea and vomiting.  Constitutional:   Patient denies fever, night sweats, weight loss, and fatigue.  Skin:   Patient denies skin rash/ lesion and itching.  Eyes:   Patient denies blurred vision and double vision.  Ears/ Nose/ Throat:   Patient denies sore throat and sinus problems.  Hematologic/Lymphatic:   Patient denies easy bruising and swollen glands.  Cardiovascular:   Patient denies leg swelling and chest pains.  Respiratory:   Patient denies cough and shortness of breath.  Endocrine:   Patient denies excessive thirst.  Musculoskeletal:   Patient denies back pain and joint pain.  Neurological:   Patient denies headaches and dizziness.  Psychologic:   Patient denies depression and anxiety.   VITAL SIGNS:      05/29/2021 03:22 PM  Weight 175 lb / 79.38 kg  Height 73 in / 185.42 cm  BP 150/92 mmHg  Pulse 79 /min  Temperature 97.3 F / 36.2 C  BMI 23.1 kg/m   GU  PHYSICAL EXAMINATION:      Notes: He has an indwelling urethral catheter with grossly clear urine.   MULTI-SYSTEM PHYSICAL EXAMINATION:    Constitutional: Well-nourished. No physical deformities. Normally developed. Good grooming.  Respiratory: No labored breathing, no use of accessory muscles. Clear bilaterally.  Cardiovascular: Normal temperature, normal extremity pulses, no swelling, no varicosities. Regular rate and rhythm.  Gastrointestinal: No mass, no tenderness, no rigidity, non obese abdomen.      Complexity of Data:  Records Review:   Previous Hospital Records, Previous Patient Records   01/31/21 07/15/20 12/18/19 04/15/19  PSA  Total PSA 0.24 ng/mL 0.70 ng/mL 3.36 ng/mL 12.20 ng/mL    PROCEDURES: None   ASSESSMENT:      ICD-10 Details  1 GU:   Urinary Retention - R33.8   2   Prostate Cancer - C61   3   Gross hematuria - R31.0    PLAN:            Medications New Meds: Amoxicillin 500 mg tablet 1 tablet PO TID   #21  0 Refill(s)  Orders Labs BMP          Schedule Return Visit/Planned Activity: Keep Scheduled Appointment  Return Visit/Planned Activity: Other See Visit Notes             Note: Okay to cancel nurse visit for tomorrow. Will call to schedule surgery.          Document Letter(s):  Created for Patient: Clinical Summary         Notes:   1. Prostate cancer: He will keep his scheduled appointment in March for ongoing PSA surveillance.   2. Urinary retention/recurrent hematuria: Considering his recurrent episodes of clot retention and difficulty with urethral catheter management, we have discussed the option of proceeding with suprapubic tube placement under cystoscopic guidance which may be less irritating to his prostate and may reduce the risk of recurrent clot obstruction potentially. In addition, if we did see any obvious bleeding sites, we would consider fulguration at that time. He will be treated with amoxicillin over the next week  considering his recent urine culture that was positive for Enterococcus. He would like to proceed expediently and I will see if I can get him on next week. He will stop his Eliquis at least 48 hours preoperatively. We have reviewed the potential risks, complications, and expected recovery process. He and his daughter give informed consent to proceed.   CC: Dr. Horald Pollen        Next Appointment:      Next Appointment: 09/06/2021 09:45 AM    Appointment Type: Laboratory Appointment    Location: [facility] [phone]    Provider: Lab LAB    Reason for Visit: 8mo psa      * Signed by Raynelle Bring, M.D. on 05/29/21 at 6:51 PM (ES*

## 2021-06-05 ENCOUNTER — Ambulatory Visit (HOSPITAL_COMMUNITY)
Admission: RE | Admit: 2021-06-05 | Discharge: 2021-06-05 | Disposition: A | Discharge status: Home / Self Care | Payer: Medicare Other | Source: Ambulatory Visit | Attending: Urology | Admitting: Urology

## 2021-06-05 ENCOUNTER — Ambulatory Visit (HOSPITAL_COMMUNITY): Payer: Medicare Other | Admitting: Physician Assistant

## 2021-06-05 ENCOUNTER — Encounter (HOSPITAL_COMMUNITY)
Admission: RE | Disposition: A | Discharge status: Home / Self Care | Payer: Self-pay | Source: Ambulatory Visit | Attending: Urology

## 2021-06-05 ENCOUNTER — Encounter (HOSPITAL_COMMUNITY): Payer: Self-pay | Admitting: Urology

## 2021-06-05 ENCOUNTER — Other Ambulatory Visit: Payer: Self-pay

## 2021-06-05 ENCOUNTER — Ambulatory Visit (HOSPITAL_COMMUNITY): Payer: Medicare Other | Admitting: Certified Registered Nurse Anesthetist

## 2021-06-05 DIAGNOSIS — E119 Type 2 diabetes mellitus without complications: Secondary | ICD-10-CM | POA: Insufficient documentation

## 2021-06-05 DIAGNOSIS — Z79899 Other long term (current) drug therapy: Secondary | ICD-10-CM | POA: Diagnosis not present

## 2021-06-05 DIAGNOSIS — I4891 Unspecified atrial fibrillation: Secondary | ICD-10-CM | POA: Diagnosis not present

## 2021-06-05 DIAGNOSIS — R310 Gross hematuria: Secondary | ICD-10-CM | POA: Diagnosis not present

## 2021-06-05 DIAGNOSIS — Z8546 Personal history of malignant neoplasm of prostate: Secondary | ICD-10-CM | POA: Diagnosis not present

## 2021-06-05 DIAGNOSIS — Z7901 Long term (current) use of anticoagulants: Secondary | ICD-10-CM | POA: Diagnosis not present

## 2021-06-05 DIAGNOSIS — Z87891 Personal history of nicotine dependence: Secondary | ICD-10-CM | POA: Insufficient documentation

## 2021-06-05 DIAGNOSIS — I10 Essential (primary) hypertension: Secondary | ICD-10-CM | POA: Diagnosis not present

## 2021-06-05 DIAGNOSIS — R338 Other retention of urine: Secondary | ICD-10-CM | POA: Diagnosis present

## 2021-06-05 DIAGNOSIS — Z923 Personal history of irradiation: Secondary | ICD-10-CM | POA: Diagnosis not present

## 2021-06-05 DIAGNOSIS — E139 Other specified diabetes mellitus without complications: Secondary | ICD-10-CM

## 2021-06-05 HISTORY — PX: CYSTOSCOPY: SHX5120

## 2021-06-05 LAB — GLUCOSE, CAPILLARY
Glucose-Capillary: 101 mg/dL — ABNORMAL HIGH (ref 70–99)
Glucose-Capillary: 123 mg/dL — ABNORMAL HIGH (ref 70–99)

## 2021-06-05 SURGERY — CYSTOSCOPY
Anesthesia: General | Site: Abdomen

## 2021-06-05 MED ORDER — VANCOMYCIN HCL IN DEXTROSE 1-5 GM/200ML-% IV SOLN
1000.0000 mg | Freq: Once | INTRAVENOUS | Status: AC
  Administered 2021-06-05: 1000 mg via INTRAVENOUS
  Filled 2021-06-05: qty 200

## 2021-06-05 MED ORDER — ORAL CARE MOUTH RINSE: 15.0000 mL | Freq: Once | OROMUCOSAL | Status: AC

## 2021-06-05 MED ORDER — ACETAMINOPHEN 10 MG/ML IV SOLN: 1000.0000 mg | Freq: Once | INTRAVENOUS | Status: DC | PRN

## 2021-06-05 MED ORDER — PROPOFOL 10 MG/ML IV BOLUS
INTRAVENOUS | Status: DC | PRN
  Administered 2021-06-05: 200 mg via INTRAVENOUS
  Administered 2021-06-05: 30 mg via INTRAVENOUS

## 2021-06-05 MED ORDER — PROPOFOL 10 MG/ML IV BOLUS
INTRAVENOUS | Status: AC
  Filled 2021-06-05: qty 20

## 2021-06-05 MED ORDER — CHLORHEXIDINE GLUCONATE 0.12 % MT SOLN
15.0000 mL | Freq: Once | OROMUCOSAL | Status: AC
  Administered 2021-06-05: 15 mL via OROMUCOSAL

## 2021-06-05 MED ORDER — DEXAMETHASONE SODIUM PHOSPHATE 4 MG/ML IJ SOLN
INTRAMUSCULAR | Status: DC | PRN
  Administered 2021-06-05: 8 mg via INTRAVENOUS

## 2021-06-05 MED ORDER — FENTANYL CITRATE (PF) 100 MCG/2ML IJ SOLN
INTRAMUSCULAR | Status: DC | PRN
  Administered 2021-06-05 (×3): 25 ug via INTRAVENOUS

## 2021-06-05 MED ORDER — HYDRALAZINE HCL 20 MG/ML IJ SOLN
INTRAMUSCULAR | Status: AC
  Filled 2021-06-05: qty 1

## 2021-06-05 MED ORDER — FENTANYL CITRATE PF 50 MCG/ML IJ SOSY: 25.0000 ug | PREFILLED_SYRINGE | INTRAMUSCULAR | Status: DC | PRN

## 2021-06-05 MED ORDER — DEXAMETHASONE SODIUM PHOSPHATE 10 MG/ML IJ SOLN
INTRAMUSCULAR | Status: AC
  Filled 2021-06-05: qty 1

## 2021-06-05 MED ORDER — BUPIVACAINE-EPINEPHRINE (PF) 0.25% -1:200000 IJ SOLN
INTRAMUSCULAR | Status: AC
  Filled 2021-06-05: qty 30

## 2021-06-05 MED ORDER — ONDANSETRON HCL 4 MG/2ML IJ SOLN
INTRAMUSCULAR | Status: DC | PRN
  Administered 2021-06-05: 4 mg via INTRAVENOUS

## 2021-06-05 MED ORDER — HYDRALAZINE HCL 20 MG/ML IJ SOLN
5.0000 mg | Freq: Once | INTRAMUSCULAR | Status: AC
  Administered 2021-06-05: 5 mg via INTRAVENOUS

## 2021-06-05 MED ORDER — LIDOCAINE HCL (PF) 2 % IJ SOLN
INTRAMUSCULAR | Status: AC
  Filled 2021-06-05: qty 5

## 2021-06-05 MED ORDER — FENTANYL CITRATE (PF) 100 MCG/2ML IJ SOLN
INTRAMUSCULAR | Status: AC
  Filled 2021-06-05: qty 2

## 2021-06-05 MED ORDER — LACTATED RINGERS IV SOLN: INTRAVENOUS | Status: DC

## 2021-06-05 MED ORDER — ONDANSETRON HCL 4 MG/2ML IJ SOLN
INTRAMUSCULAR | Status: AC
  Filled 2021-06-05: qty 2

## 2021-06-05 SURGICAL SUPPLY — 17 items
BAG URINE DRAIN 2000ML AR STRL (UROLOGICAL SUPPLIES) ×2 IMPLANT
BAG URO CATCHER STRL LF (MISCELLANEOUS) ×2 IMPLANT
CATH FOLEY 2WAY SLVR  5CC 22FR (CATHETERS) ×1
CATH FOLEY 2WAY SLVR 5CC 22FR (CATHETERS) ×1 IMPLANT
CATH URETL OPEN END 6FR 70 (CATHETERS) IMPLANT
CLOTH BEACON ORANGE TIMEOUT ST (SAFETY) ×2 IMPLANT
GLOVE SURG ENC TEXT LTX SZ7.5 (GLOVE) ×2 IMPLANT
GOWN STRL REUS W/TWL LRG LVL3 (GOWN DISPOSABLE) ×4 IMPLANT
GUIDEWIRE STR DUAL SENSOR (WIRE) ×2 IMPLANT
GUIDEWIRE ZIPWRE .038 STRAIGHT (WIRE) IMPLANT
KIT TURNOVER KIT A (KITS) IMPLANT
MANIFOLD NEPTUNE II (INSTRUMENTS) ×2 IMPLANT
PACK CYSTO (CUSTOM PROCEDURE TRAY) ×2 IMPLANT
SPONGE DRAIN TRACH 4X4 STRL 2S (GAUZE/BANDAGES/DRESSINGS) ×2 IMPLANT
TAPE CLOTH SURG 4X10 WHT LF (GAUZE/BANDAGES/DRESSINGS) ×2 IMPLANT
TUBING CONNECTING 10 (TUBING) ×2 IMPLANT
TUBING UROLOGY SET (TUBING) IMPLANT

## 2021-06-05 NOTE — Op Note (Signed)
Preoperative diagnosis: Urinary retention  Postoperative diagnosis: Urinary retention  Procedures: 1.  Cystoscopy 2.  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  This has been managed with an indwelling Foley catheter.  Due to recurrent hematuria and discomfort, he has elected to proceed with a suprapubic tube for ongoing chronic management.  The potential risks, complications, and expected recovery process associated with this procedure were discussed in detail.  Informed consent was obtained.  Description of procedure: Patient was taken the operating room and a general anesthetic was administered.  He was given preoperative antibiotics, placed in the dorsolithotomy position, and prepped and draped in the usual sterile fashion.  Next, a preoperative timeout was performed.  Cystourethroscopy was then performed after his indwelling Foley catheter had been removed prior to prepping.  This revealed evidence of prostatic hypertrophy as previously noted due to combination of BPH and history of prostate cancer.  There was very mild erythema within the bladder consistent with edema related to his long-term indwelling catheter.  A Lowsley retractor was then placed via the urethra and was used to tent the dome of the bladder up to the lower abdominal wall.  A small 1 cm horizontal incision was then made in the skin down through the fascia allowing the Lowsley to be brought up out of the incision.  A 0 silk suture was placed through the eyelet of a 22 French catheter.  The silk suture was then grasped by the Lowsley and retracted down into the urethra.  Under cystoscopic guidance, the catheter was appropriately positioned and 30 cc of sterile water was used to inflate the balloon.  2-0 silk sutures were used to secure  the suprapubic tube in place and a sterile dressing was applied.  The patient's bladder was emptied.  He tolerated the procedure well without complications.  He was able to be awakened and transferred to the recovery unit in satisfactory condition.

## 2021-06-05 NOTE — Discharge Instructions (Addendum)
You may see some blood in the urine and may have some burning with urination for 48-72 hours. You also may notice that you have to urinate more frequently or urgently after your procedure which is normal.  You should call should you develop an inability urinate, fever > 101, persistent nausea and vomiting that prevents you from eating or drinking to stay hydrated.    

## 2021-06-05 NOTE — Anesthesia Preprocedure Evaluation (Signed)
Anesthesia Evaluation  Patient identified by MRN, date of birth, ID band Patient awake    Reviewed: Allergy & Precautions, NPO status , Patient's Chart, lab work & pertinent test results  Airway Mallampati: II  TM Distance: >3 FB Neck ROM: Full    Dental  (+) Edentulous Upper, Poor Dentition, Missing   Pulmonary neg pulmonary ROS, former smoker,    Pulmonary exam normal        Cardiovascular hypertension, Pt. on medications and Pt. on home beta blockers + dysrhythmias Atrial Fibrillation  Rhythm:Irregular Rate:Normal     Neuro/Psych negative neurological ROS  negative psych ROS   GI/Hepatic negative GI ROS, Neg liver ROS,   Endo/Other  diabetes  Renal/GU negative Renal ROS  negative genitourinary   Musculoskeletal negative musculoskeletal ROS (+)   Abdominal Normal abdominal exam  (+)   Peds  Hematology negative hematology ROS (+)   Anesthesia Other Findings   Reproductive/Obstetrics                             Anesthesia Physical Anesthesia Plan  ASA: 3  Anesthesia Plan: General   Post-op Pain Management:    Induction: Intravenous  PONV Risk Score and Plan: 2 and Ondansetron, Dexamethasone and Treatment may vary due to age or medical condition  Airway Management Planned: Mask and LMA  Additional Equipment: None  Intra-op Plan:   Post-operative Plan: Extubation in OR  Informed Consent: I have reviewed the patients History and Physical, chart, labs and discussed the procedure including the risks, benefits and alternatives for the proposed anesthesia with the patient or authorized representative who has indicated his/her understanding and acceptance.     Dental advisory given  Plan Discussed with: CRNA  Anesthesia Plan Comments: (Lab Results      Component                Value               Date                      WBC                      3.6 (L)             05/30/2021                 HGB                      13.0                05/30/2021                HCT                      40.9                05/30/2021                MCV                      91.3                05/30/2021                PLT  194                 05/30/2021          )        Anesthesia Quick Evaluation

## 2021-06-05 NOTE — Anesthesia Procedure Notes (Signed)
Procedure Name: LMA Insertion Date/Time: 06/05/2021 1:50 PM Performed by: Deliah Boston, CRNA Pre-anesthesia Checklist: Patient identified, Emergency Drugs available, Suction available and Patient being monitored Patient Re-evaluated:Patient Re-evaluated prior to induction Oxygen Delivery Method: Circle system utilized Preoxygenation: Pre-oxygenation with 100% oxygen Induction Type: IV induction Ventilation: Mask ventilation without difficulty LMA: LMA with gastric port inserted LMA Size: 5.0 Number of attempts: 1 Placement Confirmation: positive ETCO2 and breath sounds checked- equal and bilateral Tube secured with: Tape Dental Injury: Teeth and Oropharynx as per pre-operative assessment

## 2021-06-05 NOTE — Anesthesia Postprocedure Evaluation (Signed)
Anesthesia Post Note  Patient: Duong Haydel  Procedure(s) Performed: CYSTOSCOPY WITH SUPRA PUBIC TUBE PLACEMENT (Abdomen)     Patient location during evaluation: PACU Anesthesia Type: General Level of consciousness: awake and alert Pain management: pain level controlled Vital Signs Assessment: post-procedure vital signs reviewed and stable Respiratory status: spontaneous breathing, nonlabored ventilation, respiratory function stable and patient connected to nasal cannula oxygen Cardiovascular status: blood pressure returned to baseline and stable Postop Assessment: no apparent nausea or vomiting Anesthetic complications: no   No notable events documented.  Last Vitals:  Vitals:   06/05/21 1523 06/05/21 1530  BP: (!) 167/89 (!) 160/86  Pulse: 67 66  Resp: 17   Temp: 36.5 C   SpO2: 100% 100%    Last Pain:  Vitals:   06/05/21 1523  TempSrc:   PainSc: 0-No pain                 Belenda Cruise P Wynter Isaacs

## 2021-06-05 NOTE — Interval H&P Note (Signed)
History and Physical Interval Note:  06/05/2021 11:01 AM  Nathan [name]  has presented today for surgery, with the diagnosis of URINARY RETENTION, HEMATURIA.  The various methods of treatment have been discussed with the patient and family. After consideration of risks, benefits and other options for treatment, the patient has consented to  Procedure(s): [place] (N/A) as a surgical intervention.  The patient's history has been reviewed, patient examined, no change in status, stable for surgery.  I have reviewed the patient's chart and labs.  Questions were answered to the patient's satisfaction.     [employer]

## 2021-06-05 NOTE — Transfer of Care (Signed)
Immediate Anesthesia Transfer of Care Note  Patient: Maxey Ransom  Procedure(s) Performed: Procedure(s): CYSTOSCOPY WITH SUPRA PUBIC TUBE PLACEMENT (N/A)  Patient Location: PACU  Anesthesia Type:General  Level of Consciousness: Patient easily awoken, sedated, comfortable, cooperative, following commands, responds to stimulation.   Airway & Oxygen Therapy: Patient spontaneously breathing, ventilating well, oxygen via simple oxygen mask.  Post-op Assessment: Report given to PACU RN, vital signs reviewed and stable, moving all extremities.   Post vital signs: Reviewed and stable.  Complications: No apparent anesthesia complications Last Vitals:  Vitals Value Taken Time  BP 178/97 06/05/21 1420  Temp    Pulse 62 06/05/21 1422  Resp 15 06/05/21 1422  SpO2 100 % 06/05/21 1422  Vitals shown include unvalidated device data.  Last Pain:  Vitals:   06/05/21 1132  TempSrc:   PainSc: 0-No pain         Complications: No notable events documented.

## 2021-06-06 ENCOUNTER — Encounter (HOSPITAL_COMMUNITY): Payer: Self-pay | Admitting: Urology

## 2021-06-23 ENCOUNTER — Telehealth: Payer: Self-pay | Admitting: Internal Medicine

## 2021-06-23 NOTE — Telephone Encounter (Signed)
Spoke with pt wife, aware okay to hold eliquis for 2 days. Aware he is at higher risk of stroke if he stops the eliquis, patient wife voiced understanding.

## 2021-06-23 NOTE — Telephone Encounter (Signed)
blood in his foley bag for the past 3 weeks.. pt would like to know if pt should come off of eliquis to see if this will clear it up... please advise

## 2021-08-07 ENCOUNTER — Telehealth: Payer: Self-pay | Admitting: Internal Medicine

## 2021-08-07 NOTE — Telephone Encounter (Signed)
Spoke with daughter who reports the suprapubic catheter has "maroon blood" in it. Since the procedure in December, there has been bloody urine. The first 2 weeks, pt followed up with urologist. Daughter took pt off eliquis for 3 days, which made the blood "lighter". When she started the eleiquis again, the bloody urine returned. There has been only "one long clot' in the urine.. Pt had lab work recently and the PCP told daughter the hgb was under 12 and wanted to star pt on iron. Please advise.

## 2021-08-07 NOTE — Telephone Encounter (Signed)
Pt c/o medication issue:  1. Name of Medication:  apixaban (ELIQUIS) 5 MG TABS  2. How are you currently taking this medication (dosage and times per day)? 1 tablet by mouth twice daily   3. Are you having a reaction (difficulty breathing--STAT)? No   4. What is your medication issue? Patient's daughter is calling stating that [name] has been having bleeding ever since his procedure on 12/05. She states he had a super pubic tube done and has had blood in his cath bag ever since, his urine has not been yellow once. She was advised to call and discuss stopping his Eliquis due to this. Please advise.

## 2021-08-07 NOTE — Telephone Encounter (Signed)
LMTCB

## 2021-08-08 NOTE — Telephone Encounter (Signed)
Anticoagulation concern also routed to pharmacy team for advice

## 2021-08-10 NOTE — Telephone Encounter (Signed)
Patient's daughter calling back. She states she was supposed to get a call back Tuesday, but has not heard anything about stopping the eliquis.

## 2021-08-10 NOTE — Telephone Encounter (Signed)
Verbal from [facility] that it is OK to hold eliquis for 1-2 days   Advised daughter of this but that MD will also need to review

## 2021-08-11 NOTE — Telephone Encounter (Signed)
Spoke with daughter to inform her not to give patient any more eliquis. Daughter stated she stopped giving it last night. Recommended she continue to monitor the urine and let us know when it clears of blood. She voiced understanding of this conversation.

## 2021-09-08 ENCOUNTER — Other Ambulatory Visit: Payer: Self-pay | Admitting: Internal Medicine

## 2021-09-25 NOTE — Telephone Encounter (Addendum)
Daughter reports the suprapubic catheter puts out pink-tinged urine. According to daughter, the catheter is not taped. The site is free from swelling, redness, pus. Recommended that she secure the tubing. She has used Tegaderm in the past and will use it again to secure the tubing. Recommended that she contact urologist about the pin-tinged urine. She wants to know if patient should be started on eliquis again. ?

## 2021-09-25 NOTE — Telephone Encounter (Signed)
Daughter of the patient called to follow up from previous call. She is not sure when to start the patient back on his eliquis. The Daughter reports that the patient's urine is occasionally light pink but it is not the dark burgundy color it was before  he stopped the medication. She said he goes a couple days without having any blood then one day it will have just traces of blood. She is not sure what to do. ? ? ?

## 2021-09-27 NOTE — Telephone Encounter (Signed)
According to the records, he has had recurrent urinary tract bleeding on Eliquis over the past several years (2021, 2022) - now has a suprapubic catheter. I don't think he is a good candidate for long-term anticoagulation on Eliquis. I would not restart Eliquis at this time. We may want to consider a referral for Watchman if the are interested. ? ?Dr. Debara Pickett ?

## 2021-09-28 NOTE — Telephone Encounter (Signed)
Spoke with Daughter [name] and informed her not to give her father eliquis. She would like to discuss anticoagulation therapy/Watchman with Dr. Debara Pickett. ?

## 2021-10-01 NOTE — Telephone Encounter (Signed)
Will need office or virtual visit to discuss Watchman if interested. ? ?Dr. Lemmie Evens ?

## 2021-10-02 NOTE — Telephone Encounter (Signed)
Daughter [name] scheduled appointment for 5/15 to discuss Watchman for patient. ?

## 2021-11-13 ENCOUNTER — Encounter: Payer: Self-pay | Admitting: Internal Medicine

## 2021-11-13 ENCOUNTER — Ambulatory Visit (INDEPENDENT_AMBULATORY_CARE_PROVIDER_SITE_OTHER): Payer: Medicare Other | Admitting: Internal Medicine

## 2021-11-13 VITALS — BP 188/90 | HR 70 | Ht 73.0 in | Wt 195.2 lb

## 2021-11-13 DIAGNOSIS — R310 Gross hematuria: Secondary | ICD-10-CM | POA: Diagnosis not present

## 2021-11-13 DIAGNOSIS — I10 Essential (primary) hypertension: Secondary | ICD-10-CM

## 2021-11-13 DIAGNOSIS — Z7901 Long term (current) use of anticoagulants: Secondary | ICD-10-CM | POA: Diagnosis not present

## 2021-11-13 DIAGNOSIS — I480 Paroxysmal atrial fibrillation: Secondary | ICD-10-CM | POA: Diagnosis not present

## 2021-11-13 NOTE — Patient Instructions (Signed)
Medication Instructions:  ?Your physician recommends that you continue on your current medications as directed. Please refer to the Current Medication list given to you today. ? ?*If you need a refill on your cardiac medications before your next appointment, please call your pharmacy* ? ? ?Follow-Up: ?At [facility], you and your health needs are our priority.  As part of our continuing mission to provide you with exceptional heart care, we have created designated Provider Care Teams.  These Care Teams include your primary Cardiologist (physician) and Advanced Practice Providers (APPs -  Physician Assistants and Nurse Practitioners) who all work together to provide you with the care you need, when you need it. ? ?We recommend signing up for the patient portal called "MyChart".  Sign up information is provided on this After Visit Summary.  MyChart is used to connect with patients for Virtual Visits (Telemedicine).  Patients are able to view lab/test results, encounter notes, upcoming appointments, etc.  Non-urgent messages can be sent to your provider as well.   ?To learn more about what you can do with MyChart, go to [email or web address].   ? ?Your next appointment:   ?6 month(s) ? ?The format for your next appointment:   ?In Person ? ?Provider:   ?Pixie Casino, MD { ? ? ?Important Information About Sugar ? ? ? ? ? ? ?

## 2021-11-13 NOTE — Progress Notes (Signed)
? ? ?OFFICE NOTE ? ?Chief Complaint:  ?Follow-up A. Fib, urinary tract bleeding ? ?Primary Care Physician: ?[name], MD ? ?HPI:  ?Nathan [name] is a 76 y.o. 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  He was not followed by cardiologist rather was seen by internal medicine in [place].  He recently moved to the area to be with his daughter.  He also has a history of alcohol abuse in the past which sounded fairly heavy.  Recently was diagnosed with prostate cancer and is considering treatment options.  There is also diabetes and hypertension.  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  His medical providers name was Dr. Sandie Ano at [facility] in [place] - phone # [phone].  He had been maintained on warfarin and reported compliance with the medication however his dose was quite low at only 1 mg daily.  His daughter reports that since she has been taking care of him here in [place] which is for several months he has been alcohol free.  Overall Nathan [name] is without complaints.  He denies any chest pain or worsening shortness of breath.  Recently he was having issues with hypotension and his primary care provider discontinued a number of medications including his beta-blocker.  He remains on lisinopril and nifedipine.  He was previously on Toprol-XL 25 mg daily. ? ?07/10/2019 ? ?Nathan [name] returns today for follow-up of his A. fib.  Overall he is doing well now on Eliquis.  He says he does not feel any different on it.  He had no bleeding issues with it.  I switched him to Toprol-XL which he previously taken to help with any possible breakthrough of WPW or A. fib.  I have also taken off of the Procardia.  Blood pressure is well controlled today 133/73.  He is working with both urology and oncology as far as treatment for diagnosed prostate  cancer.  He is likely getting get radiation seed implants.  He is also going to be deferring his lipoma resection.  If necessary at this point he could stop his Eliquis 2 to 3 days prior to that and restarted afterwards when bleeding risk is low. ? ?01/18/2020 ? ?Nathan [name] is seen today in follow-up.  He successfully underwent radiation seed implants and is noted per his daughter to have marked decline in his PSA from over 12 down to 3.  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  She says that recently he has been having notable hematuria.  This seems to be somewhat intermittent however said that the urologist wanted to talk with me about managing his Eliquis.  In addition he will likely have to stop the Eliquis for short period of time for the chest wall lipoma.  G shows that he is in sinus rhythm today. ? ?11/13/2021 ? ?Nathan [name] returns today for follow-up of atrial fibrillation.  Fortunately I do not see any recent recurrence of his atrial fibrillation, however he does have a history of this in the past.  It may have been associated with heavier alcohol use but has been abstinent for a while.  He does have prostate cancer and underwent a suprapubic catheter placement.  Since then he has had issues with ongoing urinary tract bleeding which was worse on Eliquis.  He has now been  off the Eliquis for several months however he is at increased risk of stroke with an estimated [name]2DS2-VASc score of 4.  Today he is here to discuss possible Watchman procedure. ? ?PMHx:  ?Past Medical History:  ?Diagnosis Date  ? Anginal pain ([place])   ? Atrial fibrillation ([place]) 01/2020  ? resolved now   ? Cancer of prostate ([place]) 2020  ? Chronic anticoagulation   ? Diabetes mellitus without complication ([place])   ? type 2 diet controlled  ? Dysrhythmia   ? a fib  ? Foley catheter in place   ? will be changed 05-16-20  ? HTN (hypertension)   ? Lipoma of chest wall   ? Pneumonia 01/2020  ? and uti in hospital for 1 week  ? WPW  (Wolff-Parkinson-White syndrome)   ? No prior ablation  ? ? ?Past Surgical History:  ?Procedure Laterality Date  ? CATARACT EXTRACTION Bilateral 2020  ? CYSTOSCOPY N/A 06/05/2021  ? Procedure: CYSTOSCOPY WITH SUPRA PUBIC TUBE PLACEMENT;  Surgeon: Raynelle Bring, MD;  Location: [facility];  Service: Urology;  Laterality: N/A;  ? LIPOMA EXCISION Left 05/16/2020  ? Procedure: EXCISION LIPOMA LEFT CHEST WALL;  Surgeon: Kinsinger, Arta Bruce, MD;  Location: [facility];  Service: General;  Laterality: Left;  ? RADIOACTIVE SEED IMPLANT  2020  ? ? ?FAMHx:  ?Family History  ?Problem Relation Age of Onset  ? Lung cancer Mother   ? Cervical cancer Sister   ? Breast cancer Neg Hx   ? Pancreatic cancer Neg Hx   ? Colon cancer Neg Hx   ? ? ?SOCHx:  ? reports that he quit smoking about 2 years ago. His smoking use included cigarettes. He has a 30.00 pack-year smoking history. He has never used smokeless tobacco. He reports that he does not currently use alcohol. He reports that he does not use drugs. ? ?ALLERGIES:  ?No Known Allergies ? ?ROS: ?Pertinent items noted in HPI and remainder of comprehensive ROS otherwise negative. ? ?HOME MEDS: ?Current Outpatient Medications on File Prior to Visit  ?Medication Sig Dispense Refill  ? folic acid (FOLVITE) 1 MG tablet Take 1 mg by mouth daily.    ? gabapentin (NEURONTIN) 300 MG capsule Take 300 mg by mouth 3 (three) times daily.    ? lisinopril (ZESTRIL) 40 MG tablet Take 40 mg by mouth daily.    ? magnesium oxide (MAG-OX) 400 MG tablet Take 400 mg by mouth daily.     ? metoprolol succinate (TOPROL-XL) 50 MG 24 hr tablet Take 50 mg by mouth daily.    ? oxybutynin (DITROPAN-XL) 10 MG 24 hr tablet Take 10 mg by mouth daily.    ? Prenatal Vit-Fe Fumarate-FA (PRENATAL MULTIVITAMIN) TABS tablet Take 1 tablet by mouth daily at 12 noon.    ? finasteride (PROSCAR) 5 MG tablet Take 5 mg by mouth daily. (Patient not taking: Reported on 11/13/2021)    ? ?No current facility-administered  medications on file prior to visit.  ? ? ?LABS/IMAGING: ?No results found for this or any previous visit (from the past 48 hour(s)). ?No results found. ? ?LIPID PANEL: ?No results found for: CHOL, TRIG, HDL, CHOLHDL, VLDL, LDLCALC, LDLDIRECT ?  ?WEIGHTS: ?Wt Readings from Last 3 Encounters:  ?11/13/21 195 lb 3.2 oz (88.5 kg)  ?06/05/21 183 lb (83 kg)  ?05/30/21 183 lb (83 kg)  ? ? ?VITALS: ?BP (!) 188/90   Pulse 70   Ht '6\' 1"'$  (1.854 m)   Wt 195 lb 3.2 oz (  88.5 kg)   SpO2 98%   BMI 25.75 kg/m?  ? ?EXAM: ?General appearance: alert, no distress and Thin ?Neck: no carotid bruit, no JVD and thyroid not enlarged, symmetric, no tenderness/mass/nodules ?Lungs: clear to auscultation bilaterally ?Heart: Regular tachycardia, no murmur ?Abdomen: soft, non-tender; bowel sounds normal; no masses,  no organomegaly ?Extremities: extremities normal, atraumatic, no cyanosis or edema ?Pulses: 2+ and symmetric ?Skin: Skin color, texture, turgor normal. No rashes or lesions ?Neurologic: Grossly normal ?Psych: Pleasant ? ?EKG: ?Normal sinus rhythm at 70-personally reviewed ? ?ASSESSMENT: ?Reported history of A. fib with WPW ?Anticoagulated on Eliquis -currently on hold due to hematuria ?History of chronic alcohol abuse-now abstinent ?Chest wall lipoma ?Prostate cancer-with recent hematuria ?Hypertension ?Type 2 diabetes-diet controlled, A1c 6.0 ? ?PLAN: ?1.   Nathan [name] has been having issues with recurrent hematuria which is present even off Eliquis but certainly worsened with that.  He has been off of the medicine now for several months.  His [name]2DS2-VASc score is 4 and therefore increased risk of stroke.  We discussed the possibility of Watchman procedure. ? ?I have seen Nathan [name] is a 76 y.o. male in the office today who is being considered for a Watchman left atrial appendage closure device.  He has a history of paroxysmal atrial fibrillation.  This patients [name]2DS2-VASc Score and unadjusted Ischemic Stroke Rate (% per  year) is equal to 4.8 % stroke rate/year from a score of 4 which necessitates long term oral anticoagulation to prevent stroke.  Unfortunately, He is not felt to be a long term anticoagulation candidate secondary to h

## 2021-12-09 ENCOUNTER — Emergency Department (HOSPITAL_COMMUNITY): Payer: Medicare Other

## 2021-12-09 ENCOUNTER — Encounter (HOSPITAL_COMMUNITY): Payer: Self-pay

## 2021-12-09 ENCOUNTER — Other Ambulatory Visit: Payer: Self-pay

## 2021-12-09 ENCOUNTER — Inpatient Hospital Stay (HOSPITAL_COMMUNITY)
Admission: EM | Admit: 2021-12-09 | Discharge: 2021-12-11 | DRG: 300 | Disposition: A | Discharge status: Home Health | Payer: Medicare Other | Attending: Family Medicine | Admitting: Family Medicine

## 2021-12-09 DIAGNOSIS — D62 Acute posthemorrhagic anemia: Secondary | ICD-10-CM | POA: Diagnosis present

## 2021-12-09 DIAGNOSIS — R319 Hematuria, unspecified: Secondary | ICD-10-CM | POA: Diagnosis present

## 2021-12-09 DIAGNOSIS — I82442 Acute embolism and thrombosis of left tibial vein: Secondary | ICD-10-CM | POA: Diagnosis present

## 2021-12-09 DIAGNOSIS — D649 Anemia, unspecified: Secondary | ICD-10-CM

## 2021-12-09 DIAGNOSIS — Z8546 Personal history of malignant neoplasm of prostate: Secondary | ICD-10-CM

## 2021-12-09 DIAGNOSIS — M7989 Other specified soft tissue disorders: Secondary | ICD-10-CM | POA: Diagnosis not present

## 2021-12-09 DIAGNOSIS — I4891 Unspecified atrial fibrillation: Secondary | ICD-10-CM

## 2021-12-09 DIAGNOSIS — Z9359 Other cystostomy status: Secondary | ICD-10-CM

## 2021-12-09 DIAGNOSIS — I82412 Acute embolism and thrombosis of left femoral vein: Principal | ICD-10-CM | POA: Diagnosis present

## 2021-12-09 DIAGNOSIS — D68318 Other hemorrhagic disorder due to intrinsic circulating anticoagulants, antibodies, or inhibitors: Secondary | ICD-10-CM | POA: Diagnosis not present

## 2021-12-09 DIAGNOSIS — I10 Essential (primary) hypertension: Secondary | ICD-10-CM

## 2021-12-09 DIAGNOSIS — Z801 Family history of malignant neoplasm of trachea, bronchus and lung: Secondary | ICD-10-CM | POA: Diagnosis not present

## 2021-12-09 DIAGNOSIS — I82409 Acute embolism and thrombosis of unspecified deep veins of unspecified lower extremity: Secondary | ICD-10-CM | POA: Diagnosis present

## 2021-12-09 DIAGNOSIS — Z79899 Other long term (current) drug therapy: Secondary | ICD-10-CM | POA: Diagnosis not present

## 2021-12-09 DIAGNOSIS — Z8049 Family history of malignant neoplasm of other genital organs: Secondary | ICD-10-CM

## 2021-12-09 DIAGNOSIS — N390 Urinary tract infection, site not specified: Secondary | ICD-10-CM

## 2021-12-09 DIAGNOSIS — I82402 Acute embolism and thrombosis of unspecified deep veins of left lower extremity: Secondary | ICD-10-CM

## 2021-12-09 DIAGNOSIS — I82432 Acute embolism and thrombosis of left popliteal vein: Secondary | ICD-10-CM | POA: Diagnosis present

## 2021-12-09 DIAGNOSIS — R338 Other retention of urine: Secondary | ICD-10-CM | POA: Diagnosis present

## 2021-12-09 DIAGNOSIS — Z87891 Personal history of nicotine dependence: Secondary | ICD-10-CM | POA: Diagnosis not present

## 2021-12-09 DIAGNOSIS — I82452 Acute embolism and thrombosis of left peroneal vein: Secondary | ICD-10-CM | POA: Diagnosis present

## 2021-12-09 DIAGNOSIS — C61 Malignant neoplasm of prostate: Secondary | ICD-10-CM | POA: Diagnosis present

## 2021-12-09 DIAGNOSIS — R339 Retention of urine, unspecified: Secondary | ICD-10-CM

## 2021-12-09 DIAGNOSIS — R310 Gross hematuria: Secondary | ICD-10-CM | POA: Diagnosis not present

## 2021-12-09 DIAGNOSIS — E119 Type 2 diabetes mellitus without complications: Secondary | ICD-10-CM

## 2021-12-09 DIAGNOSIS — I824Z2 Acute embolism and thrombosis of unspecified deep veins of left distal lower extremity: Secondary | ICD-10-CM | POA: Diagnosis present

## 2021-12-09 DIAGNOSIS — I480 Paroxysmal atrial fibrillation: Secondary | ICD-10-CM | POA: Diagnosis present

## 2021-12-09 LAB — COMPREHENSIVE METABOLIC PANEL
ALT: 9 U/L (ref 0–44)
AST: 18 U/L (ref 15–41)
Albumin: 3.5 g/dL (ref 3.5–5.0)
Alkaline Phosphatase: 67 U/L (ref 38–126)
Anion gap: 11 (ref 5–15)
BUN: 13 mg/dL (ref 8–23)
CO2: 24 mmol/L (ref 22–32)
Calcium: 9 mg/dL (ref 8.9–10.3)
Chloride: 104 mmol/L (ref 98–111)
Creatinine, Ser: 1.16 mg/dL (ref 0.61–1.24)
GFR, Estimated: >60 mL/min (ref >60)
Glucose, Bld: 109 mg/dL — ABNORMAL HIGH (ref 70–99)
Potassium: 3.9 mmol/L (ref 3.5–5.1)
Sodium: 139 mmol/L (ref 135–145)
Total Bilirubin: 0.7 mg/dL (ref 0.3–1.2)
Total Protein: 7.1 g/dL (ref 6.5–8.1)

## 2021-12-09 LAB — CBC WITH DIFFERENTIAL/PLATELET
Abs Immature Granulocytes: 0.02 10*3/uL (ref 0.00–0.07)
Basophils Absolute: 0 10*3/uL (ref 0.0–0.1)
Basophils Relative: 0 %
Eosinophils Absolute: 0 10*3/uL (ref 0.0–0.5)
Eosinophils Relative: 0 %
HCT: 31.1 % — ABNORMAL LOW (ref 39.0–52.0)
Hemoglobin: 9.6 g/dL — ABNORMAL LOW (ref 13.0–17.0)
Immature Granulocytes: 0 %
Lymphocytes Relative: 9 %
Lymphs Abs: 0.8 10*3/uL (ref 0.7–4.0)
MCH: 27.3 pg (ref 26.0–34.0)
MCHC: 30.9 g/dL (ref 30.0–36.0)
MCV: 88.4 fL (ref 80.0–100.0)
Monocytes Absolute: 1.5 10*3/uL — ABNORMAL HIGH (ref 0.1–1.0)
Monocytes Relative: 17 %
Neutro Abs: 6.3 10*3/uL (ref 1.7–7.7)
Neutrophils Relative %: 74 %
Platelets: 223 10*3/uL (ref 150–400)
RBC: 3.52 MIL/uL — ABNORMAL LOW (ref 4.22–5.81)
RDW: 14.6 % (ref 11.5–15.5)
WBC: 8.7 10*3/uL (ref 4.0–10.5)
nRBC: 0 % (ref 0.0–0.2)

## 2021-12-09 LAB — TSH: TSH: 0.682 u[IU]/mL (ref 0.350–4.500)

## 2021-12-09 LAB — URINALYSIS, ROUTINE W REFLEX MICROSCOPIC
Bilirubin Urine: NEGATIVE
Glucose, UA: NEGATIVE mg/dL
Ketones, ur: 5 mg/dL — AB
Nitrite: NEGATIVE
Protein, ur: >=300 mg/dL — AB
RBC / HPF: >50 RBC/hpf — ABNORMAL HIGH (ref 0–5)
Specific Gravity, Urine: 1.019 (ref 1.005–1.030)
WBC, UA: >50 WBC/hpf — ABNORMAL HIGH (ref 0–5)
pH: 5 (ref 5.0–8.0)

## 2021-12-09 LAB — IRON AND TIBC
Iron: 19 ug/dL — ABNORMAL LOW (ref 45–182)
Saturation Ratios: 6 % — ABNORMAL LOW (ref 17.9–39.5)
TIBC: 304 ug/dL (ref 250–450)
UIBC: 285 ug/dL

## 2021-12-09 LAB — BRAIN NATRIURETIC PEPTIDE: B Natriuretic Peptide: 92.8 pg/mL (ref 0.0–100.0)

## 2021-12-09 LAB — HEMOGLOBIN AND HEMATOCRIT, BLOOD
HCT: 32 % — ABNORMAL LOW (ref 39.0–52.0)
Hemoglobin: 10.2 g/dL — ABNORMAL LOW (ref 13.0–17.0)

## 2021-12-09 LAB — HEMOGLOBIN A1C
Hgb A1c MFr Bld: 5.1 % (ref 4.8–5.6)
Mean Plasma Glucose: 99.67 mg/dL

## 2021-12-09 LAB — LACTIC ACID, PLASMA
Lactic Acid, Venous: 1.3 mmol/L (ref 0.5–1.9)
Lactic Acid, Venous: 1.5 mmol/L (ref 0.5–1.9)

## 2021-12-09 MED ORDER — ACETAMINOPHEN 650 MG RE SUPP: 650.0000 mg | Freq: Four times a day (QID) | RECTAL | Status: DC | PRN

## 2021-12-09 MED ORDER — ONDANSETRON HCL 4 MG/2ML IJ SOLN: 4.0000 mg | Freq: Four times a day (QID) | INTRAMUSCULAR | Status: DC | PRN

## 2021-12-09 MED ORDER — SODIUM CHLORIDE 0.9 % IV SOLN
2.0000 g | INTRAVENOUS | Status: DC
  Administered 2021-12-10 – 2021-12-11 (×2): 2 g via INTRAVENOUS
  Filled 2021-12-09 (×2): qty 20

## 2021-12-09 MED ORDER — SODIUM CHLORIDE 0.9 % IV SOLN
1.0000 g | Freq: Once | INTRAVENOUS | Status: AC
  Administered 2021-12-09: 1 g via INTRAVENOUS
  Filled 2021-12-09: qty 10

## 2021-12-09 MED ORDER — ONDANSETRON HCL 4 MG PO TABS: 4.0000 mg | ORAL_TABLET | Freq: Four times a day (QID) | ORAL | Status: DC | PRN

## 2021-12-09 MED ORDER — FINASTERIDE 5 MG PO TABS
5.0000 mg | ORAL_TABLET | Freq: Every day | ORAL | Status: DC
  Administered 2021-12-09 – 2021-12-11 (×3): 5 mg via ORAL
  Filled 2021-12-09 (×3): qty 1

## 2021-12-09 MED ORDER — OXYCODONE HCL 5 MG PO TABS
5.0000 mg | ORAL_TABLET | ORAL | Status: DC | PRN
  Administered 2021-12-09 – 2021-12-11 (×4): 5 mg via ORAL
  Filled 2021-12-09 (×4): qty 1

## 2021-12-09 MED ORDER — SODIUM CHLORIDE 0.9 % IV SOLN: Freq: Once | INTRAVENOUS | Status: AC

## 2021-12-09 MED ORDER — METOPROLOL SUCCINATE ER 100 MG PO TB24
100.0000 mg | ORAL_TABLET | Freq: Every day | ORAL | Status: DC
  Administered 2021-12-09 – 2021-12-11 (×3): 100 mg via ORAL
  Filled 2021-12-09 (×3): qty 1

## 2021-12-09 MED ORDER — VITAMIN B-12 1000 MCG PO TABS
1000.0000 ug | ORAL_TABLET | Freq: Every day | ORAL | Status: DC
Start: 2021-12-09 — End: 2021-12-11
  Administered 2021-12-09 – 2021-12-11 (×3): 1000 ug via ORAL
  Filled 2021-12-09 (×3): qty 1

## 2021-12-09 MED ORDER — GABAPENTIN 300 MG PO CAPS
300.0000 mg | ORAL_CAPSULE | Freq: Three times a day (TID) | ORAL | Status: DC
  Administered 2021-12-09 – 2021-12-11 (×7): 300 mg via ORAL
  Filled 2021-12-09 (×7): qty 1

## 2021-12-09 MED ORDER — FOLIC ACID 1 MG PO TABS
1.0000 mg | ORAL_TABLET | Freq: Every day | ORAL | Status: DC
  Administered 2021-12-09 – 2021-12-11 (×3): 1 mg via ORAL
  Filled 2021-12-09 (×3): qty 1

## 2021-12-09 MED ORDER — LISINOPRIL 20 MG PO TABS
40.0000 mg | ORAL_TABLET | Freq: Every day | ORAL | Status: DC
  Administered 2021-12-09 – 2021-12-11 (×3): 40 mg via ORAL
  Filled 2021-12-09 (×3): qty 2

## 2021-12-09 MED ORDER — MAGNESIUM OXIDE 400 MG PO TABS
400.0000 mg | ORAL_TABLET | Freq: Every day | ORAL | Status: DC
Start: 2021-12-09 — End: 2021-12-09
  Filled 2021-12-09: qty 1

## 2021-12-09 MED ORDER — SODIUM CHLORIDE 0.9 % IV SOLN: 2.0000 g | INTRAVENOUS | Status: DC

## 2021-12-09 MED ORDER — MAGNESIUM OXIDE -MG SUPPLEMENT 400 (240 MG) MG PO TABS
400.0000 mg | ORAL_TABLET | Freq: Every day | ORAL | Status: DC
Start: 2021-12-10 — End: 2021-12-11
  Administered 2021-12-10: 400 mg via ORAL
  Filled 2021-12-09: qty 1

## 2021-12-09 MED ORDER — MORPHINE SULFATE (PF) 2 MG/ML IV SOLN
2.0000 mg | INTRAVENOUS | Status: DC | PRN
  Administered 2021-12-09: 2 mg via INTRAVENOUS
  Filled 2021-12-09: qty 1

## 2021-12-09 MED ORDER — ACETAMINOPHEN 325 MG PO TABS
650.0000 mg | ORAL_TABLET | Freq: Four times a day (QID) | ORAL | Status: DC | PRN
  Administered 2021-12-09 – 2021-12-10 (×2): 650 mg via ORAL
  Filled 2021-12-09 (×2): qty 2

## 2021-12-09 MED ORDER — IOHEXOL 300 MG/ML  SOLN
100.0000 mL | Freq: Once | INTRAMUSCULAR | Status: AC | PRN
  Administered 2021-12-09: 100 mL via INTRAVENOUS

## 2021-12-09 NOTE — ED Triage Notes (Addendum)
Pt BIB EMS from home. Pt has a suprapubic catheter in place (came out yesterday, and pts daughter replaced it). Chronic left leg pain, PCP concerned for DVT. Left leg swelling started yesterday. EMS reports cloudy urine in bag.  146/84 100.3 temp 95% RA 110 HR

## 2021-12-09 NOTE — ED Provider Notes (Signed)
[place] DEPT Provider Note   CSN: [id number] Arrival date & time: 12/09/21  1019     History  Chief Complaint  Patient presents with   Fever   Left Leg Swelling    Nathan [name] is a 76 y.o. male.  The history is provided by the patient, medical records and a relative. No language interpreter was used.  Fever Max temp prior to arrival:  100.3 with EMS Temp source:  Oral Severity:  Mild Timing:  Unable to specify Chronicity:  New Relieved by:  Nothing Worsened by:  Nothing Associated symptoms: no chest pain, no chills, no confusion, no congestion, no cough, no diarrhea, no dysuria, no headaches, no nausea, no rash, no rhinorrhea and no vomiting        Home Medications Prior to Admission medications   Medication Sig Start Date End Date Taking? Authorizing Provider  finasteride (PROSCAR) 5 MG tablet Take 5 mg by mouth daily. Patient not taking: Reported on 11/13/2021    [provider]  folic acid (FOLVITE) 1 MG tablet Take 1 mg by mouth daily. 03/24/19   [provider]  gabapentin (NEURONTIN) 300 MG capsule Take 300 mg by mouth 3 (three) times daily. 10/27/21   [provider]  lisinopril (ZESTRIL) 40 MG tablet Take 40 mg by mouth daily. 09/24/20   [provider]  magnesium oxide (MAG-OX) 400 MG tablet Take 400 mg by mouth daily.  03/24/19   [provider]  metoprolol succinate (TOPROL-XL) 50 MG 24 hr tablet Take 50 mg by mouth daily. 08/16/20   [provider]  oxybutynin (DITROPAN-XL) 10 MG 24 hr tablet Take 10 mg by mouth daily. 07/07/20   [provider]  Prenatal Vit-Fe Fumarate-FA (PRENATAL MULTIVITAMIN) TABS tablet Take 1 tablet by mouth daily at 12 noon.    [provider]      Allergies    Patient has no known allergies.    Review of Systems   Review of Systems  Constitutional:  Positive for fever. Negative for chills, diaphoresis and fatigue.  HENT:   Negative for congestion and rhinorrhea.   Respiratory:  Negative for cough, chest tightness, shortness of breath and wheezing.   Cardiovascular:  Positive for leg swelling. Negative for chest pain and palpitations.  Gastrointestinal:  Negative for constipation, diarrhea, nausea and vomiting.  Genitourinary:  Negative for dysuria, flank pain and frequency.       Urine darker  Musculoskeletal:  Positive for back pain (chronic). Negative for neck pain and neck stiffness.  Skin:  Negative for rash and wound.  Neurological:  Negative for light-headedness and headaches.  Psychiatric/Behavioral:  Negative for agitation and confusion.   All other systems reviewed and are negative.   Physical Exam Updated Vital Signs BP (!) 151/93   Pulse 92   Temp 98.3 F (36.8 C) (Oral)   Resp 16   SpO2 100%  Physical Exam Vitals and nursing note reviewed.  Constitutional:      General: He is not in acute distress.    Appearance: He is well-developed. He is not ill-appearing, toxic-appearing or diaphoretic.  HENT:     Head: Normocephalic and atraumatic.     Nose: Nose normal.     Mouth/Throat:     Mouth: Mucous membranes are moist.  Eyes:     Extraocular Movements: Extraocular movements intact.     Conjunctiva/sclera: Conjunctivae normal.     Pupils: Pupils are equal, round, and reactive to light.  Cardiovascular:  Rate and Rhythm: Regular rhythm. Tachycardia present.     Heart sounds: No murmur heard. Pulmonary:     Effort: Pulmonary effort is normal. No respiratory distress.     Breath sounds: Normal breath sounds. No wheezing, rhonchi or rales.  Chest:     Chest wall: No tenderness.  Abdominal:     General: Abdomen is flat.     Palpations: Abdomen is soft.     Tenderness: There is abdominal tenderness (mild suprapubic tenderness). There is no right CVA tenderness, left CVA tenderness, guarding or rebound.  Musculoskeletal:        General: Tenderness (low back tenderness) present. No  swelling.     Cervical back: Neck supple. No tenderness.     Right lower leg: No edema.     Left lower leg: No edema.  Skin:    General: Skin is warm and dry.     Capillary Refill: Capillary refill takes less than 2 seconds.     Findings: No erythema or rash.  Neurological:     Mental Status: He is alert.     Sensory: No sensory deficit.     Motor: No weakness.  Psychiatric:        Mood and Affect: Mood normal.     ED Results / Procedures / Treatments   Labs (all labs ordered are listed, but only abnormal results are displayed) Labs Reviewed  CBC WITH DIFFERENTIAL/PLATELET - Abnormal; Notable for the following components:      Result Value   RBC 3.52 (*)    Hemoglobin 9.6 (*)    HCT 31.1 (*)    Monocytes Absolute 1.5 (*)    All other components within normal limits  COMPREHENSIVE METABOLIC PANEL - Abnormal; Notable for the following components:   Glucose, Bld 109 (*)    All other components within normal limits  URINALYSIS, ROUTINE W REFLEX MICROSCOPIC - Abnormal; Notable for the following components:   Color, Urine BROWN (*)    APPearance CLOUDY (*)    Hgb urine dipstick LARGE (*)    Ketones, ur 5 (*)    Protein, ur >=300 (*)    Leukocytes,Ua MODERATE (*)    RBC / HPF >50 (*)    WBC, UA >50 (*)    Bacteria, UA FEW (*)    All other components within normal limits  URINE CULTURE  CULTURE, BLOOD (ROUTINE X 2)  CULTURE, BLOOD (ROUTINE X 2)  TSH  BRAIN NATRIURETIC PEPTIDE  LACTIC ACID, PLASMA  LACTIC ACID, PLASMA    EKG EKG Interpretation  Date/Time:  Saturday December 09 2021 10:48:21 EDT Ventricular Rate:  94 PR Interval:  104 QRS Duration: 81 QT Interval:  364 QTC Calculation: 456 R Axis:   97 Text Interpretation: Sinus rhythm Sinus pause Short PR interval Right axis deviation Abnormal R-wave progression, early transition similar to prior with more pauses. No STEMI Confirmed by Antony Blackbird [phone]) on 12/09/2021 10:49:40 AM  Radiology VAS [place] LOWER EXTREMITY  VENOUS (DVT) (ONLY [facility] & [facility])  Result Date: 12/09/2021  Lower Venous DVT Study Patient Name:  Nathan [name]  Date of Exam:   12/09/2021 Medical Rec #: [id number]       Accession #:    [id number] Date of Birth: Jul 26, 1945        Patient Gender: M Patient Age:   53 years Exam Location:  [facility] Procedure:      VAS [place] LOWER EXTREMITY VENOUS (DVT) Referring Phys: Marda Stalker --------------------------------------------------------------------------------  Indications: Swelling.  Risk  Factors: Prostate CA Afib - has been off anticoagulants due to hematuria. Comparison Study: No previous exams Performing Technologist: Jody Hill RVT, RDMS  Examination Guidelines: A complete evaluation includes B-mode imaging, spectral Doppler, color Doppler, and power Doppler as needed of all accessible portions of each vessel. Bilateral testing is considered an integral part of a complete examination. Limited examinations for reoccurring indications may be performed as noted. The reflux portion of the exam is performed with the patient in reverse Trendelenburg.  +-----+---------------+---------+-----------+----------+--------------+ RIGHTCompressibilityPhasicitySpontaneityPropertiesThrombus Aging +-----+---------------+---------+-----------+----------+--------------+ CFV  Full           Yes      Yes                                 +-----+---------------+---------+-----------+----------+--------------+   +---------+---------------+---------+-----------+----------+--------------+ LEFT     CompressibilityPhasicitySpontaneityPropertiesThrombus Aging +---------+---------------+---------+-----------+----------+--------------+ CFV      Full           Yes      Yes                                 +---------+---------------+---------+-----------+----------+--------------+ SFJ      Full                                                         +---------+---------------+---------+-----------+----------+--------------+ FV Prox  Full           Yes      Yes                                 +---------+---------------+---------+-----------+----------+--------------+ FV Mid   None           No       No                   Acute          +---------+---------------+---------+-----------+----------+--------------+ FV DistalNone           No       No                   Acute          +---------+---------------+---------+-----------+----------+--------------+ PFV      Full                                                        +---------+---------------+---------+-----------+----------+--------------+ POP      None           No       No                   Acute          +---------+---------------+---------+-----------+----------+--------------+ PTV      None           No       No                   Acute          +---------+---------------+---------+-----------+----------+--------------+ PERO  None           No       No                   Acute          +---------+---------------+---------+-----------+----------+--------------+ Gastroc  None           No       No                   Acute          +---------+---------------+---------+-----------+----------+--------------+ SSV      None           No       No                   Acute          +---------+---------------+---------+-----------+----------+--------------+    Summary: RIGHT: - No evidence of common femoral vein obstruction.  LEFT: - Findings consistent with acute deep vein thrombosis involving the left femoral vein, left popliteal vein, left posterior tibial veins, left peroneal veins, and left gastrocnemius veins. - Findings consistent with acute superficial vein thrombosis involving the left small saphenous vein. - No cystic structure found in the popliteal fossa.  *See table(s) above for measurements and observations.    Preliminary    DG Chest  Portable 1 View  Result Date: 12/09/2021 CLINICAL DATA:  Pt BIB EMS from home. Pt has a suprapubic catheter in place (came out yesterday, and pts daughter replaced it). Chronic left leg pain, PCP concerned for DVT. Left leg swelling started yesterday. EMS reports cloudy urine in bag. Pt hx of diabetes. EXAM: PORTABLE CHEST 1 VIEW COMPARISON:  02/24/2020 and older studies. FINDINGS: Cardiac silhouette is normal in size and configuration. No mediastinal or hilar masses. Lungs hyperexpanded, but clear. No pleural effusion or pneumothorax. Skeletal structures are grossly intact. IMPRESSION: No active disease. Electronically Signed   By: Lajean Manes M.D.   On: 12/09/2021 12:04    Procedures Procedures    CRITICAL CARE Performed by: Gwenyth Allegra Gunnard Dorrance Total critical care time: 35 minutes Critical care time was exclusive of separately billable procedures and treating other patients. Critical care was necessary to treat or prevent imminent or life-threatening deterioration. Critical care was time spent personally by me on the following activities: development of treatment plan with patient and/or surrogate as well as nursing, discussions with consultants, evaluation of patient's response to treatment, examination of patient, obtaining history from patient or surrogate, ordering and performing treatments and interventions, ordering and review of laboratory studies, ordering and review of radiographic studies, pulse oximetry and re-evaluation of patient's condition.   Medications Ordered in ED Medications  cefTRIAXone (ROCEPHIN) 1 g in sodium chloride 0.9 % 100 mL IVPB (has no administration in time range)    ED Course/ Medical Decision Making/ A&P                           Medical Decision Making Amount and/or Complexity of Data Reviewed Labs: ordered. Radiology: ordered.  Risk Decision regarding hospitalization.    Nathan [name] is a 76 y.o. male with a past medical history  significant for atrial fibrillation not on anticoagulation, previous WPW, prostate cancer, suprapubic catheter dependence, hypertension, diabetes, and chronic abdominal pain who presents with new left leg swelling for the last 2 days, darkened urine, and fever.  According to patient, since yesterday he has had swelling and  discomfort of his left leg.  He has never had a history of blood clots and denies any trauma to the leg.  He reports it is sore and the whole left leg is swollen.  He reports she has chronic pain across his abdomen and that does not seem any worse however he does report that his catheter was backed up and fell out yesterday and was replaced by family.  He reports that since replacement the urine has been cloudy and darker.  He denies any nausea, vomiting, congestion, chest pain, shortness of breath, cough, constipation, or diarrhea.  He denies any fevers or chills however he was found to be near febrile with EMS with a temperature of 100.3 orally.  On arrival here his temperature is afebrile but we will get a rectal temperature to confirm.  He reports his abdominal pain is similar to prior and he is denying headache, neck pain, back pain, or flank pain being new.  On exam, abdomen is slightly tender to palpation inferiorly.  She does have a supra catheter in place that is draining urine.  Normal bowel sounds.  Chest nontender and lungs clear.  No murmur.  Flanks nontender.  Some low back tenderness which she reports is unchanged from baseline.  No CVA tenderness specifically.  Patient has intact sensation, strength, and pulses in the legs but his left leg is markedly swollen compared to the right.  This is new.  No rash or erythema seen.  Clinically I am concerned about new DVT given his cancer history and this report that he is been off of blood thinners for the last few months due to urinary bleeding.  We will get a DVT ultrasound to evaluate, however, if ultrasound is negative, I will be  more concerned that there is a large mass or something blocking his venous flow coming from his abdomen instead.  If ultrasound does not show DVT, dissipate CT scan of the abdomen pelvis to look for a venous obstruction more proximally.  We will also get labs including urinalysis given the urine changes, check his temp and rule out infection given the near fever, and will get chest x-ray with his tachycardia.  With his lack of any chest pain or shortness of breath we will hold on any CT PE scan.  Anticipate reassessment after work-up.  12:29 PM Work-up continues to return.  Patient's hemoglobin is lower than prior and he does have a large amount of hemoglobin in his urine.  His urine does show leukocytes and bacteria so given the cloudiness it could be recurrent UTI.  More concerning is his DVT ultrasound is positive for DVT.  With the patient's significant hematuria problems on blood thinners in the past, his decreased hemoglobin, and the blood in the urine currently I am concerned about starting on blood thinners.  We will likely need to discuss with medicine team about this.  His TSH is normal.  Lactic acid nonelevated.  Do not feel he is septic at this time.  No leukocytosis.  Metabolic panel reassuring.  Chest x-ray does not show pneumonia or other acute abnormality.  Anticipate reassessment after labs are completed to discuss disposition.    Patient's hemoglobin is decreased from prior and he does have blood in his urine.  Given the fact that he had to stop his Eliquis due to hematuria I am concerned that restarting at this time.  We will consult vascular surgery discuss if he will be a candidate for an IVC filter and  discuss further management as he does have DVT going into his femoral vein.  1:30 PM Just poke with Dr. Gwenlyn Saran with vascular surgery who thinks that if the patient is unable to get anticoagulation which it sounds, he would be a decent candidate for IVC filter placement.  He request  patient be admitted to [facility] and he would likely be able to get IVC filter tomorrow.  We will discuss this with patient and will likely admit.  1:55 PM Spoke to admitting team who will admit.  They requested a CT on pelvis with contrast to get a better look at his bladder given previous abnormalities that are concerning for bladder mass as this could be the source of hematuria.  They agreed with antibiotics which we will order for possible UTI.  They will admit for further management and likely IVC filter tomorrow with Dr. Donzetta Matters.          Final Clinical Impression(s) / ED Diagnoses Final diagnoses:  None    Rx / DC Orders ED Discharge Orders     None         Travontae Freiberger, Gwenyth Allegra, MD 12/09/21 1356

## 2021-12-09 NOTE — Consult Note (Signed)
ED Consult    Reason for Consult:  dvt with contraindication to anticoagulation Referring Physician:  Dr. Sherry Ruffing MRN #:  [id number]  History of Present Illness: This is a 76 y.o. male with a history of atrial fibrillation we have discussed watchman in the past he is currently not anticoagulated due to hematuria.  He also has a history of prostate cancer.  He presents now with a 1 day history of left lower extremity swelling which is mostly below the knee and has been quite uncomfortable.  He can still feel his foot and move his foot and ankle and leg just with discomfort.  On arrival he was noted to have blood in his catheter bag and CBC confirms decreased H&H from previous evaluation.  I discussed with the patient and his daughter on the phone who provided much of the history.  Along with left lower extremity swelling he is also complained of back abdominal pain as well as fevers prior to arrival.  Past Medical History:  Diagnosis Date   Anginal pain ([place])    Atrial fibrillation ([place]) 01/2020   resolved now    Cancer of prostate ([place]) 2020   Chronic anticoagulation    Diabetes mellitus without complication ([place])    type 2 diet controlled   Dysrhythmia    a fib   Foley catheter in place    will be changed 05-16-20   HTN (hypertension)    Lipoma of chest wall    Pneumonia 01/2020   and uti in hospital for 1 week   WPW (Wolff-Parkinson-White syndrome)    No prior ablation    Past Surgical History:  Procedure Laterality Date   CATARACT EXTRACTION Bilateral 2020   CYSTOSCOPY N/A 06/05/2021   Procedure: CYSTOSCOPY WITH SUPRA PUBIC TUBE PLACEMENT;  Surgeon: Raynelle Bring, MD;  Location: [facility];  Service: Urology;  Laterality: N/A;   LIPOMA EXCISION Left 05/16/2020   Procedure: EXCISION LIPOMA LEFT CHEST WALL;  Surgeon: Kinsinger, Arta Bruce, MD;  Location: [facility];  Service: General;  Laterality: Left;   RADIOACTIVE SEED IMPLANT  2020    No Known  Allergies  Prior to Admission medications   Medication Sig Start Date End Date Taking? Authorizing Provider  Ergocalciferol (VITAMIN D2 PO) Take 50,000 Units by mouth daily.   Yes [provider]  finasteride (PROSCAR) 5 MG tablet Take 5 mg by mouth daily.   Yes [provider]  folic acid (FOLVITE) 1 MG tablet Take 1 mg by mouth daily. 03/24/19  Yes [provider]  gabapentin (NEURONTIN) 300 MG capsule Take 300 mg by mouth 3 (three) times daily. 10/27/21  Yes [provider]  lisinopril (ZESTRIL) 40 MG tablet Take 40 mg by mouth daily. 09/24/20  Yes [provider]  magnesium oxide (MAG-OX) 400 MG tablet Take 400 mg by mouth daily.  03/24/19  Yes [provider]  metoprolol succinate (TOPROL-XL) 100 MG 24 hr tablet Take 100 mg by mouth daily. 11/14/21  Yes [provider]  Prenatal Vit-Fe Fumarate-FA (PRENATAL MULTIVITAMIN) TABS tablet Take 1 tablet by mouth daily at 12 noon.   Yes [provider]  vitamin B-12 (CYANOCOBALAMIN) 1000 MCG tablet Take 1,000 mcg by mouth daily.   Yes [provider]    Social History   Socioeconomic History   Marital status: Married    Spouse name: Not on file   Number of children: 9   Years of education: Not on file   Highest education level: Not  on file  Occupational History   Not on file  Tobacco Use   Smoking status: Former    Packs/day: 0.50    Years: 60.00    Total pack years: 30.00    Types: Cigarettes    Quit date: 01/31/2019    Years since quitting: 2.8   Smokeless tobacco: Never  Vaping Use   Vaping Use: Never used  Substance and Sexual Activity   Alcohol use: Not Currently    Comment: no alochol since 2020   Drug use: Never   Sexual activity: Not Currently  Other Topics Concern   Not on file  Social History Narrative   Not on file   Social Determinants of Health   Financial Resource Strain: Not on file  Food Insecurity: Not on file  Transportation  Needs: Not on file  Physical Activity: Not on file  Stress: Not on file  Social Connections: Not on file  Intimate Partner Violence: Not on file     Family History  Problem Relation Age of Onset   Lung cancer Mother    Cervical cancer Sister    Breast cancer Neg Hx    Pancreatic cancer Neg Hx    Colon cancer Neg Hx     Review of Systems  Constitutional:  Positive for fever and malaise/fatigue.  HENT: Negative.    Eyes: Negative.   Respiratory: Negative.    Cardiovascular:  Positive for leg swelling.  Gastrointestinal:  Positive for abdominal pain.       No appetite  Musculoskeletal:  Positive for back pain.  Skin: Negative.   Neurological: Negative.   Endo/Heme/Allergies: Negative.   Psychiatric/Behavioral: Negative.       Physical Examination  Vitals:   12/09/21 1345 12/09/21 1400  BP:  (!) 158/92  Pulse: 91 90  Resp: (!) 23 18  Temp:    SpO2: 98% 98%   There is no height or weight on file to calculate BMI.  Physical Exam HENT:     Head: Normocephalic.     Nose: Nose normal.  Cardiovascular:     Rate and Rhythm: Normal rate.     Pulses: Normal pulses.  Musculoskeletal:     Right lower leg: No edema.     Left lower leg: Edema present.  Skin:    General: Skin is warm.     Capillary Refill: Capillary refill takes less than 2 seconds.  Neurological:     Mental Status: He is alert.  Psychiatric:        Mood and Affect: Mood normal.        Behavior: Behavior normal.        Thought Content: Thought content normal.        Judgment: Judgment normal.      CBC    Component Value Date/Time   WBC 8.7 12/09/2021 1105   RBC 3.52 (L) 12/09/2021 1105   HGB 9.6 (L) 12/09/2021 1105   HCT 31.1 (L) 12/09/2021 1105   PLT 223 12/09/2021 1105   MCV 88.4 12/09/2021 1105   MCH 27.3 12/09/2021 1105   MCHC 30.9 12/09/2021 1105   RDW 14.6 12/09/2021 1105   LYMPHSABS 0.8 12/09/2021 1105   MONOABS 1.5 (H) 12/09/2021 1105   EOSABS 0.0 12/09/2021 1105   BASOSABS  0.0 12/09/2021 1105    BMET    Component Value Date/Time   NA 139 12/09/2021 1105   NA 147 (H) 06/09/2019 1447   K 3.9 12/09/2021 1105   CL 104 12/09/2021 1105  CO2 24 12/09/2021 1105   GLUCOSE 109 (H) 12/09/2021 1105   BUN 13 12/09/2021 1105   BUN 16 06/09/2019 1447   CREATININE 1.16 12/09/2021 1105   CALCIUM 9.0 12/09/2021 1105   GFRNONAA >60 12/09/2021 1105   GFRAA >60 02/28/2020 0238    COAGS: Lab Results  Component Value Date   INR 1.9 (H) 02/23/2020   INR 1.8 (H) 02/22/2020   INR 1.1 (A) 06/05/2019     Non-Invasive Vascular Imaging:   RIGHTCompressibilityPhasicitySpontaneityPropertiesThrombus Aging  +-----+---------------+---------+-----------+----------+--------------+  CFV  Full           Yes      Yes                                  +-----+---------------+---------+-----------+----------+--------------+           +---------+---------------+---------+-----------+----------+--------------+   LEFT     CompressibilityPhasicitySpontaneityPropertiesThrombus  Aging  +---------+---------------+---------+-----------+----------+--------------+   CFV      Full           Yes      Yes                                    +---------+---------------+---------+-----------+----------+--------------+   SFJ      Full                                                           +---------+---------------+---------+-----------+----------+--------------+   FV Prox  Full           Yes      Yes                                    +---------+---------------+---------+-----------+----------+--------------+   FV Mid   None           No       No                   Acute            +---------+---------------+---------+-----------+----------+--------------+   FV DistalNone           No       No                   Acute            +---------+---------------+---------+-----------+----------+--------------+   PFV      Full                                                            +---------+---------------+---------+-----------+----------+--------------+   POP      None           No       No                   Acute            +---------+---------------+---------+-----------+----------+--------------+   PTV      None  No       No                   Acute            +---------+---------------+---------+-----------+----------+--------------+   PERO     None           No       No                   Acute            +---------+---------------+---------+-----------+----------+--------------+   Gastroc  None           No       No                   Acute            +---------+---------------+---------+-----------+----------+--------------+   SSV      None           No       No                   Acute            +---------+---------------+---------+-----------+----------+--------------+       Summary:  RIGHT:  - No evidence of common femoral vein obstruction.     LEFT:  - Findings consistent with acute deep vein thrombosis involving the left  femoral vein, left popliteal vein, left posterior tibial veins, left  peroneal veins, and left gastrocnemius veins.  - Findings consistent with acute superficial vein thrombosis involving the  left small saphenous vein.  - No cystic structure found in the popliteal fossa.   ASSESSMENT/PLAN: This is a 76 y.o. male with extensive left lower extremity DVT and contraindication anticoagulation given ongoing hematuria.  Per medicine he will get CT abdomen and pelvis to evaluate for recurrent cancer which is certainly reasonable given the large size of this DVT and concern for obstructive nature.  I discussed with the patient and the daughter proceeding with IVC filter to prevent pulmonary embolus and they demonstrate good understanding.  I discussed with him that this would not be treated his leg swelling and in the future could  actually make his swelling worse.  Would likely plan for this filter to remain indefinitely.  Ellissa Ayo C. Donzetta Matters, MD [facility] Office: [phone] Pager: [phone]

## 2021-12-09 NOTE — Anesthesia Preprocedure Evaluation (Addendum)
Anesthesia Evaluation  Patient identified by MRN, date of birth, ID band Patient awake    Reviewed: Allergy & Precautions, NPO status , Patient's Chart, lab work & pertinent test results  History of Anesthesia Complications Negative for: history of anesthetic complications  Airway Mallampati: II  TM Distance: >3 FB Neck ROM: Full    Dental  (+) Edentulous Upper, Poor Dentition, Missing, Dental Advisory Given   Pulmonary former smoker,    breath sounds clear to auscultation       Cardiovascular hypertension, Pt. on medications (-) angina+ DVT  + dysrhythmias (WPW) Atrial Fibrillation  Rhythm:Irregular Rate:Normal     Neuro/Psych negative neurological ROS     GI/Hepatic negative GI ROS, Neg liver ROS,   Endo/Other  diabetes (glu 88)  Renal/GU negative Renal ROS   Prostate cancer    Musculoskeletal   Abdominal   Peds  Hematology  (+) Blood dyscrasia (Hb 8.3), anemia ,   Anesthesia Other Findings   Reproductive/Obstetrics                            Anesthesia Physical Anesthesia Plan  ASA: 3  Anesthesia Plan: General   Post-op Pain Management: Tylenol PO (pre-op)*   Induction: Intravenous  PONV Risk Score and Plan: 2 and Ondansetron and Dexamethasone  Airway Management Planned: Oral ETT  Additional Equipment: None  Intra-op Plan:   Post-operative Plan: Extubation in OR  Informed Consent: I have reviewed the patients History and Physical, chart, labs and discussed the procedure including the risks, benefits and alternatives for the proposed anesthesia with the patient or authorized representative who has indicated his/her understanding and acceptance.     Dental advisory given  Plan Discussed with: CRNA and Surgeon  Anesthesia Plan Comments:        Anesthesia Quick Evaluation

## 2021-12-09 NOTE — H&P (Signed)
History and Physical    Patient: Nathan [name] GDJ:[id number] DOB: 1946/05/08 DOA: 12/09/2021 DOS: the patient was seen and examined on 12/09/2021 PCP: [name], MD  Patient coming from: Home  Chief Complaint:  Chief Complaint  Patient presents with   Fever   Left Leg Swelling   HPI: Nathan [name] is a 76 y.o. male with medical history significant of afib, urinary retention s/p SPC, prostate ca, HTN, DM2. Presenting with LLE swelling and pain. He reports that he's had LLE  pain and swelling since yesterday. He hasn't noticed any drainage. It has been warm to touch. He's not had any N/V/D. He also reports intermittent hematuria over the last several days. His daughter says she visited her father last night and he informed her that his suprapubic catheter had fallen out. He was not aware how. So she replaced the cath. She then noticed that his leg was swollen. She felt his foot and he was hot. She took his temp and it was normal. When she checked on him this morning, he was feeling worse and his temp was up to ~100. She called his PCP who recommended that he go to the ED. He denies any other aggravating or alleviating factors.   Review of Systems: As mentioned in the history of present illness. All other systems reviewed and are negative. Past Medical History:  Diagnosis Date   Anginal pain ([place])    Atrial fibrillation ([place]) 01/2020   resolved now    Cancer of prostate ([place]) 2020   Chronic anticoagulation    Diabetes mellitus without complication ([place])    type 2 diet controlled   Dysrhythmia    a fib   Foley catheter in place    will be changed 05-16-20   HTN (hypertension)    Lipoma of chest wall    Pneumonia 01/2020   and uti in hospital for 1 week   WPW (Wolff-Parkinson-White syndrome)    No prior ablation   Past Surgical History:  Procedure Laterality Date   CATARACT EXTRACTION Bilateral 2020   CYSTOSCOPY N/A 06/05/2021   Procedure: CYSTOSCOPY WITH SUPRA PUBIC TUBE  PLACEMENT;  Surgeon: Raynelle Bring, MD;  Location: [facility];  Service: Urology;  Laterality: N/A;   LIPOMA EXCISION Left 05/16/2020   Procedure: EXCISION LIPOMA LEFT CHEST WALL;  Surgeon: Kinsinger, Arta Bruce, MD;  Location: [facility];  Service: General;  Laterality: Left;   RADIOACTIVE SEED IMPLANT  2020   Social History:  reports that he quit smoking about 2 years ago. His smoking use included cigarettes. He has a 30.00 pack-year smoking history. He has never used smokeless tobacco. He reports that he does not currently use alcohol. He reports that he does not use drugs.  No Known Allergies  Family History  Problem Relation Age of Onset   Lung cancer Mother    Cervical cancer Sister    Breast cancer Neg Hx    Pancreatic cancer Neg Hx    Colon cancer Neg Hx     Prior to Admission medications   Medication Sig Start Date End Date Taking? Authorizing Provider  Ergocalciferol (VITAMIN D2 PO) Take 50,000 Units by mouth daily.   Yes [provider]  finasteride (PROSCAR) 5 MG tablet Take 5 mg by mouth daily.   Yes [provider]  folic acid (FOLVITE) 1 MG tablet Take 1 mg by mouth daily. 03/24/19  Yes [provider]  gabapentin (NEURONTIN) 300 MG capsule Take 300 mg by mouth 3 (  three) times daily. 10/27/21  Yes [provider]  lisinopril (ZESTRIL) 40 MG tablet Take 40 mg by mouth daily. 09/24/20  Yes [provider]  magnesium oxide (MAG-OX) 400 MG tablet Take 400 mg by mouth daily.  03/24/19  Yes [provider]  metoprolol succinate (TOPROL-XL) 100 MG 24 hr tablet Take 100 mg by mouth daily. 11/14/21  Yes [provider]  Prenatal Vit-Fe Fumarate-FA (PRENATAL MULTIVITAMIN) TABS tablet Take 1 tablet by mouth daily at 12 noon.   Yes [provider]  vitamin B-12 (CYANOCOBALAMIN) 1000 MCG tablet Take 1,000 mcg by mouth daily.   Yes [provider]    Physical Exam: Vitals:   12/09/21 1200  12/09/21 1230 12/09/21 1300 12/09/21 1330  BP: (!) 171/92 (!) 159/102 (!) 160/115 (!) 151/93  Pulse: 91 92 97 92  Resp: (!) 22 (!) '23 16 16  '$ Temp:      TempSrc:      SpO2: 97% 99% 98% 100%   General: 76 y.o. male resting in bed in NAD Eyes: PERRL, normal sclera ENMT: Nares patent w/o discharge, orophaynx clear, dentition normal, ears w/o discharge/lesions/ulcers Neck: Supple, trachea midline Cardiovascular: irregular, +S1, S2, no m/g/r, equal pulses throughout Respiratory: CTABL, no w/r/r, normal WOB GI: BS+, NDNT, no masses noted, no organomegaly noted MSK: No c/c; LLE edema Neuro: A&O x 3, no focal deficits Psyc: Appropriate interaction and affect, calm/cooperative   Data Reviewed:  Na+  139 K+  3.9 BUN  13 Scr  1.16 WBC  8.7 Hgb  9.6  Venous doppler RIGHT:  - No evidence of common femoral vein obstruction.  LEFT:  - Findings consistent with acute deep vein thrombosis involving the left  femoral vein, left popliteal vein, left posterior tibial veins, left  peroneal veins, and left gastrocnemius veins.  - Findings consistent with acute superficial vein thrombosis involving the  left small saphenous vein.  - No cystic structure found in the popliteal fossa.   CT ab/pelvis w/ contrast 1. Bladder decompressed with a suprapubic Foley catheter, limiting its assessment. Possible bladder mass versus enlarged prostate bulging into the bladder base. 2. Mild dilation of the left ureter, without a ureteral stone. No hydronephrosis. 3. Low-attenuation renal masses consistent with cysts. 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 4. Aortic atherosclerosis.  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 Hx of prostate CA s/p seeding/radiation     - d/t repeated significant hematuria; his anticoagulation for a fib was d/c'd in Feb; not a candidate to continue     - hematuria noted on UA and he has had intermittent hematuria over the last couple of days     - CT ab/pelvis as above, essentially the same as previous; talked with urology: unchanged from previous scans and he's had cystoscopy since his last scan and there was no bladder CA, that was the median lobe of the prostate; appreciate their assistance     - continue home regimen  UTI     - follow Ucx     - continue fluids, abx  A fib     - continue home regimen  HTN     - continue home regimen  DM2     - diet controlled     - check A1c     - DM diet, daily glucose check  Normocytic anemia     - from GU loss?     - CT ab/pelvis pending     - trend Hgb, transfuse for Hgb <7  Advance Care Planning:   Code Status: FULL  Consults: Vascular Surgery  Family Communication: w/ daughter by phone  Severity of Illness: The appropriate patient status for this patient is INPATIENT. Inpatient status is judged to be reasonable and necessary in order to provide the required intensity of service to ensure the patient's safety. The patient's presenting symptoms, physical exam findings, and initial radiographic and laboratory data in the context of their chronic comorbidities is felt to place them at high risk for further clinical deterioration. Furthermore, it is not anticipated that the patient will be medically stable for discharge from the hospital within 2 midnights of admission.   * I certify that at the point of admission it is my clinical judgment that the patient will require inpatient hospital care spanning beyond 2 midnights from the point of admission due to high intensity of service, high risk for further deterioration and high frequency of  surveillance required.*  Author: Jonnie Finner, DO 12/09/2021 1:53 PM  For on call review [email or web address].

## 2021-12-09 NOTE — Progress Notes (Signed)
LLE venous duplex has been completed.  Preliminary findings given to Dr. Sherry Ruffing.   Results can be found under chart review under CV PROC. 12/09/2021 12:53 PM Dejai Schubach RVT, RDMS

## 2021-12-10 ENCOUNTER — Inpatient Hospital Stay (HOSPITAL_COMMUNITY): Payer: Medicare Other | Admitting: Anesthesiology

## 2021-12-10 ENCOUNTER — Other Ambulatory Visit: Payer: Self-pay

## 2021-12-10 ENCOUNTER — Inpatient Hospital Stay (HOSPITAL_COMMUNITY): Payer: Medicare Other

## 2021-12-10 ENCOUNTER — Encounter (HOSPITAL_COMMUNITY): Payer: Self-pay | Admitting: Internal Medicine

## 2021-12-10 ENCOUNTER — Encounter (HOSPITAL_COMMUNITY)
Admission: EM | Disposition: A | Discharge status: Home Health | Payer: Self-pay | Source: Home / Self Care | Attending: Internal Medicine

## 2021-12-10 DIAGNOSIS — Z87891 Personal history of nicotine dependence: Secondary | ICD-10-CM

## 2021-12-10 DIAGNOSIS — I82402 Acute embolism and thrombosis of unspecified deep veins of left lower extremity: Secondary | ICD-10-CM | POA: Diagnosis not present

## 2021-12-10 DIAGNOSIS — E119 Type 2 diabetes mellitus without complications: Secondary | ICD-10-CM

## 2021-12-10 DIAGNOSIS — I10 Essential (primary) hypertension: Secondary | ICD-10-CM | POA: Diagnosis not present

## 2021-12-10 DIAGNOSIS — I4891 Unspecified atrial fibrillation: Secondary | ICD-10-CM | POA: Diagnosis not present

## 2021-12-10 DIAGNOSIS — D68318 Other hemorrhagic disorder due to intrinsic circulating anticoagulants, antibodies, or inhibitors: Secondary | ICD-10-CM | POA: Diagnosis not present

## 2021-12-10 HISTORY — PX: VENA CAVA FILTER PLACEMENT: SHX1085

## 2021-12-10 LAB — COMPREHENSIVE METABOLIC PANEL
ALT: 10 U/L (ref 0–44)
AST: 16 U/L (ref 15–41)
Albumin: 2.7 g/dL — ABNORMAL LOW (ref 3.5–5.0)
Alkaline Phosphatase: 53 U/L (ref 38–126)
Anion gap: 7 (ref 5–15)
BUN: 10 mg/dL (ref 8–23)
CO2: 24 mmol/L (ref 22–32)
Calcium: 7.9 mg/dL — ABNORMAL LOW (ref 8.9–10.3)
Chloride: 104 mmol/L (ref 98–111)
Creatinine, Ser: 1.19 mg/dL (ref 0.61–1.24)
GFR, Estimated: >60 mL/min (ref >60)
Glucose, Bld: 95 mg/dL (ref 70–99)
Potassium: 3.8 mmol/L (ref 3.5–5.1)
Sodium: 135 mmol/L (ref 135–145)
Total Bilirubin: 0.8 mg/dL (ref 0.3–1.2)
Total Protein: 5.8 g/dL — ABNORMAL LOW (ref 6.5–8.1)

## 2021-12-10 LAB — CBC
HCT: 27.4 % — ABNORMAL LOW (ref 39.0–52.0)
Hemoglobin: 8.3 g/dL — ABNORMAL LOW (ref 13.0–17.0)
MCH: 26.8 pg (ref 26.0–34.0)
MCHC: 30.3 g/dL (ref 30.0–36.0)
MCV: 88.4 fL (ref 80.0–100.0)
Platelets: 217 10*3/uL (ref 150–400)
RBC: 3.1 MIL/uL — ABNORMAL LOW (ref 4.22–5.81)
RDW: 14.5 % (ref 11.5–15.5)
WBC: 7.6 10*3/uL (ref 4.0–10.5)
nRBC: 0 % (ref 0.0–0.2)

## 2021-12-10 LAB — SURGICAL PCR SCREEN
MRSA, PCR: NEGATIVE
Staphylococcus aureus: NEGATIVE

## 2021-12-10 LAB — GLUCOSE, CAPILLARY
Glucose-Capillary: 88 mg/dL (ref 70–99)
Glucose-Capillary: 79 mg/dL (ref 70–99)
Glucose-Capillary: 165 mg/dL — ABNORMAL HIGH (ref 70–99)
Glucose-Capillary: 147 mg/dL — ABNORMAL HIGH (ref 70–99)
Glucose-Capillary: 196 mg/dL — ABNORMAL HIGH (ref 70–99)

## 2021-12-10 LAB — URINE CULTURE

## 2021-12-10 SURGERY — INSERTION, FILTER, VENA CAVA
Anesthesia: General | Site: Groin | Laterality: Right

## 2021-12-10 MED ORDER — PROPOFOL 10 MG/ML IV BOLUS
INTRAVENOUS | Status: AC
  Filled 2021-12-10: qty 20

## 2021-12-10 MED ORDER — FENTANYL CITRATE (PF) 250 MCG/5ML IJ SOLN
INTRAMUSCULAR | Status: DC | PRN
Start: 2021-12-10 — End: 2021-12-10
  Administered 2021-12-10: 50 ug via INTRAVENOUS

## 2021-12-10 MED ORDER — PHENYLEPHRINE 80 MCG/ML (10ML) SYRINGE FOR IV PUSH (FOR BLOOD PRESSURE SUPPORT)
PREFILLED_SYRINGE | INTRAVENOUS | Status: DC | PRN
  Administered 2021-12-10: 80 ug via INTRAVENOUS

## 2021-12-10 MED ORDER — ONDANSETRON HCL 4 MG/2ML IJ SOLN
INTRAMUSCULAR | Status: DC | PRN
  Administered 2021-12-10: 4 mg via INTRAVENOUS

## 2021-12-10 MED ORDER — ROCURONIUM BROMIDE 10 MG/ML (PF) SYRINGE
PREFILLED_SYRINGE | INTRAVENOUS | Status: AC
  Filled 2021-12-10: qty 10

## 2021-12-10 MED ORDER — FENTANYL CITRATE (PF) 100 MCG/2ML IJ SOLN: 25.0000 ug | INTRAMUSCULAR | Status: DC | PRN

## 2021-12-10 MED ORDER — 0.9 % SODIUM CHLORIDE (POUR BTL) OPTIME
TOPICAL | Status: DC | PRN
  Administered 2021-12-10: 1000 mL

## 2021-12-10 MED ORDER — LACTATED RINGERS IV SOLN: INTRAVENOUS | Status: DC

## 2021-12-10 MED ORDER — PROPOFOL 10 MG/ML IV BOLUS
INTRAVENOUS | Status: DC | PRN
  Administered 2021-12-10: 150 mg via INTRAVENOUS

## 2021-12-10 MED ORDER — DEXAMETHASONE SODIUM PHOSPHATE 10 MG/ML IJ SOLN
INTRAMUSCULAR | Status: AC
  Filled 2021-12-10: qty 1

## 2021-12-10 MED ORDER — ROCURONIUM BROMIDE 10 MG/ML (PF) SYRINGE
PREFILLED_SYRINGE | INTRAVENOUS | Status: DC | PRN
  Administered 2021-12-10: 60 mg via INTRAVENOUS

## 2021-12-10 MED ORDER — ORAL CARE MOUTH RINSE: 15.0000 mL | Freq: Once | OROMUCOSAL | Status: AC

## 2021-12-10 MED ORDER — DEXAMETHASONE SODIUM PHOSPHATE 10 MG/ML IJ SOLN
INTRAMUSCULAR | Status: DC | PRN
  Administered 2021-12-10: 5 mg via INTRAVENOUS

## 2021-12-10 MED ORDER — LIDOCAINE 2% (20 MG/ML) 5 ML SYRINGE
INTRAMUSCULAR | Status: DC | PRN
  Administered 2021-12-10: 30 mg via INTRAVENOUS

## 2021-12-10 MED ORDER — FENTANYL CITRATE (PF) 250 MCG/5ML IJ SOLN
INTRAMUSCULAR | Status: AC
  Filled 2021-12-10: qty 5

## 2021-12-10 MED ORDER — CHLORHEXIDINE GLUCONATE 0.12 % MT SOLN: 15.0000 mL | Freq: Once | OROMUCOSAL | Status: AC

## 2021-12-10 MED ORDER — SUGAMMADEX SODIUM 200 MG/2ML IV SOLN
INTRAVENOUS | Status: DC | PRN
  Administered 2021-12-10: 400 mg via INTRAVENOUS

## 2021-12-10 MED ORDER — LIDOCAINE 2% (20 MG/ML) 5 ML SYRINGE
INTRAMUSCULAR | Status: AC
Start: 2021-12-10 — End: ?
  Filled 2021-12-10: qty 5

## 2021-12-10 MED ORDER — HEPARIN 6000 UNIT IRRIGATION SOLUTION
Status: DC | PRN
  Administered 2021-12-10: 1

## 2021-12-10 MED ORDER — SODIUM CHLORIDE (PF) 0.9 % IJ SOLN
INTRAVENOUS | Status: DC | PRN
  Administered 2021-12-10: 15 mL via INTRAMUSCULAR

## 2021-12-10 MED ORDER — CHLORHEXIDINE GLUCONATE 0.12 % MT SOLN
OROMUCOSAL | Status: AC
  Administered 2021-12-10: 15 mL via OROMUCOSAL
  Filled 2021-12-10: qty 15

## 2021-12-10 MED ORDER — ONDANSETRON HCL 4 MG/2ML IJ SOLN
INTRAMUSCULAR | Status: AC
  Filled 2021-12-10: qty 2

## 2021-12-10 MED ORDER — LIDOCAINE HCL (PF) 1 % IJ SOLN
INTRAMUSCULAR | Status: AC
  Filled 2021-12-10: qty 30

## 2021-12-10 MED ORDER — PHENYLEPHRINE 80 MCG/ML (10ML) SYRINGE FOR IV PUSH (FOR BLOOD PRESSURE SUPPORT)
PREFILLED_SYRINGE | INTRAVENOUS | Status: AC
  Filled 2021-12-10: qty 10

## 2021-12-10 MED ORDER — HEPARIN 6000 UNIT IRRIGATION SOLUTION
Status: AC
  Filled 2021-12-10: qty 500

## 2021-12-10 SURGICAL SUPPLY — 38 items
BAG COUNTER SPONGE SURGICOUNT (BAG) ×2 IMPLANT
BAG DECANTER FOR FLEXI CONT (MISCELLANEOUS) ×2 IMPLANT
BLADE SURG 11 STRL SS (BLADE) ×2 IMPLANT
CLIP LIGATING EXTRA MED SLVR (CLIP) ×2 IMPLANT
CLIP LIGATING EXTRA SM BLUE (MISCELLANEOUS) ×2 IMPLANT
COVER BACK TABLE 60X90IN (DRAPES) ×2 IMPLANT
COVER PROBE W GEL 5X96 (DRAPES) ×2 IMPLANT
COVER SURGICAL LIGHT HANDLE (MISCELLANEOUS) ×1 IMPLANT
DERMABOND ADVANCED (GAUZE/BANDAGES/DRESSINGS) ×1
DERMABOND ADVANCED .7 DNX12 (GAUZE/BANDAGES/DRESSINGS) ×1 IMPLANT
DRAPE C-ARM 42X72 X-RAY (DRAPES) ×2 IMPLANT
DRAPE LAPAROTOMY T 102X78X121 (DRAPES) ×2 IMPLANT
FILTER VC CELECT-FEMORAL (Filter) ×1 IMPLANT
GAUZE 4X4 16PLY ~~LOC~~+RFID DBL (SPONGE) ×2 IMPLANT
GAUZE SPONGE 4X4 12PLY STRL (GAUZE/BANDAGES/DRESSINGS) ×2 IMPLANT
GLOVE BIO SURGEON STRL SZ7.5 (GLOVE) ×2 IMPLANT
GLOVE BIOGEL PI IND STRL 8 (GLOVE) ×1 IMPLANT
GLOVE BIOGEL PI INDICATOR 8 (GLOVE) ×1
GOWN STRL REUS W/ TWL LRG LVL3 (GOWN DISPOSABLE) ×2 IMPLANT
GOWN STRL REUS W/TWL LRG LVL3 (GOWN DISPOSABLE) ×2
KIT BASIN OR (CUSTOM PROCEDURE TRAY) ×2 IMPLANT
KIT TURNOVER KIT B (KITS) ×2 IMPLANT
NDL HYPO 25GX1X1/2 BEV (NEEDLE) ×1 IMPLANT
NDL PERC 18GX7CM (NEEDLE) ×1 IMPLANT
NEEDLE HYPO 25GX1X1/2 BEV (NEEDLE) ×2 IMPLANT
NEEDLE PERC 18GX7CM (NEEDLE) ×2 IMPLANT
NS IRRIG 1000ML POUR BTL (IV SOLUTION) ×2 IMPLANT
PACK SURGICAL SETUP 50X90 (CUSTOM PROCEDURE TRAY) ×2 IMPLANT
PAD ARMBOARD 7.5X6 YLW CONV (MISCELLANEOUS) ×4 IMPLANT
SPIKE FLUID TRANSFER (MISCELLANEOUS) ×2 IMPLANT
SUT VICRYL 4-0 PS2 18IN ABS (SUTURE) ×2 IMPLANT
SYR 30ML LL (SYRINGE) ×2 IMPLANT
SYR 5ML LL (SYRINGE) ×2 IMPLANT
SYR CONTROL 10ML LL (SYRINGE) ×2 IMPLANT
TAPE CLOTH SURG 4X10 WHT LF (GAUZE/BANDAGES/DRESSINGS) ×1 IMPLANT
TOWEL GREEN STERILE (TOWEL DISPOSABLE) ×2 IMPLANT
WATER STERILE IRR 1000ML POUR (IV SOLUTION) ×2 IMPLANT
WIRE J 3MM .035X145CM (WIRE) ×2 IMPLANT

## 2021-12-10 NOTE — Anesthesia Postprocedure Evaluation (Signed)
Anesthesia Post Note  Patient: Nathan [name]  Procedure(s) Performed: INSERTION VENA-CAVA FILTER Right Femoral (Right: Groin)     Patient location during evaluation: PACU Anesthesia Type: General Level of consciousness: awake and alert, patient cooperative and oriented Pain management: pain level controlled Vital Signs Assessment: post-procedure vital signs reviewed and stable Respiratory status: spontaneous breathing, nonlabored ventilation and respiratory function stable Cardiovascular status: blood pressure returned to baseline and stable Postop Assessment: no apparent nausea or vomiting Anesthetic complications: no   No notable events documented.  Last Vitals:  Vitals:   12/10/21 0842 12/10/21 0901  BP: 127/84 139/80  Pulse: 77 65  Resp: (!) 21 13  Temp: 36.4 C 36.8 C  SpO2: 98% 95%    Last Pain:  Vitals:   12/10/21 0901  TempSrc: Oral  PainSc:                  Quinlyn Tep,E. Glendon [place]wo[place]

## 2021-12-10 NOTE — Progress Notes (Signed)
  Progress Note    12/10/2021 7:21 AM Day of Surgery  Subjective:  left leg pain overnight  Vitals:   12/09/21 2358 12/10/21 0435  BP: 119/80 (!) 137/101  Pulse:    Resp: 18 16  Temp: 99.3 F (37.4 C) 98.7 F (37.1 C)  SpO2: 100% 100%    Physical Exam: Aaox3 LLE with stable edema  CBC    Component Value Date/Time   WBC 7.6 12/10/2021 0234   RBC 3.10 (L) 12/10/2021 0234   HGB 8.3 (L) 12/10/2021 0234   HCT 27.4 (L) 12/10/2021 0234   PLT 217 12/10/2021 0234   MCV 88.4 12/10/2021 0234   MCH 26.8 12/10/2021 0234   MCHC 30.3 12/10/2021 0234   RDW 14.5 12/10/2021 0234   LYMPHSABS 0.8 12/09/2021 1105   MONOABS 1.5 (H) 12/09/2021 1105   EOSABS 0.0 12/09/2021 1105   BASOSABS 0.0 12/09/2021 1105    BMET    Component Value Date/Time   NA 135 12/10/2021 0234   NA 147 (H) 06/09/2019 1447   K 3.8 12/10/2021 0234   CL 104 12/10/2021 0234   CO2 24 12/10/2021 0234   GLUCOSE 95 12/10/2021 0234   BUN 10 12/10/2021 0234   BUN 16 06/09/2019 1447   CREATININE 1.19 12/10/2021 0234   CALCIUM 7.9 (L) 12/10/2021 0234   GFRNONAA >60 12/10/2021 0234   GFRAA >60 02/28/2020 0238    INR    Component Value Date/Time   INR 1.9 (H) 02/23/2020 0911     Intake/Output Summary (Last 24 hours) at 12/10/2021 0721 Last data filed at 12/10/2021 0441 Gross per 24 hour  Intake 618.02 ml  Output 500 ml  Net 118.02 ml     Assessment:  76 y.o. male is here with extensive LLE DVT.  Plan: OR today for IVC filter placement. This will need to remain indefinitely at least until patient can be anticoagulated.   Mykela Mewborn C. Donzetta Matters, MD [facility] [place] Office: [phone] Pager: [phone]  12/10/2021 7:21 AM

## 2021-12-10 NOTE — Progress Notes (Signed)
PROGRESS NOTE    Nathan [name]  OEV:[id number] DOB: 21-Jul-1945 DOA: 12/09/2021 PCP: [name], MD   Brief Narrative:    Nathan [name] is a 76 y.o. male with medical history significant of afib, urinary retention s/p SPC, prostate ca, HTN, DM2. Presenting with LLE swelling and pain. His daughter says she visited her father last night and he informed her that his suprapubic catheter had fallen out. He was not aware how. So she replaced the cath.  He was admitted with left lower extremity DVT and anticoagulation could not be started given history of significant genitourinary bleeds with ongoing hematuria at that time.  Urine analysis suspicious for UTI and he has been started on Rocephin with cultures pending.  He has undergone IVC filter placement with vascular surgery 6/11 and tolerated the procedure well.  Hemoglobin levels currently downtrending and will need monitoring, however hematuria appears to have resolved.  Assessment & Plan:   Principal Problem:   Acute DVT (deep venous thrombosis) (HCC) Active Problems:   Atrial fibrillation (HCC)   Malignant neoplasm of prostate (HCC)   Hematuria   Urinary retention   UTI (urinary tract infection)   HTN (hypertension)   DM2 (diabetes mellitus, type 2) (HCC)   Normocytic anemia  Assessment and Plan:   LLE DVT     - admit to inpt, tele @ [facility]     - unable to start anticoagulation right now d/t history of significant GU bleeds and current hematuria     -Now status post IVC filter placement 6/11 which will likely need to remain indefinitely   Hematuria-resolved 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 Hx of prostate CA s/p seeding/radiation     - d/t repeated significant hematuria; his anticoagulation for a fib was d/c'd in Feb; not a candidate to continue     - hematuria noted on UA and he has had intermittent hematuria over the last couple of days     - CT ab/pelvis as above, essentially the same as previous;  talked with urology: unchanged from previous scans and he's had cystoscopy since his last scan and there was no bladder CA, that was the median lobe of the prostate; appreciate their assistance, can follow-up outpatient as hematuria has resolved     - continue home regimen   UTI     - follow Ucx     - continue fluids, abx with Rocephin   A fib-controlled     - continue home regimen   HTN-controlled     - continue home regimen   DM2-controlled     - diet controlled     - A1c 5.1%     - DM diet, daily glucose check   Acute blood loss anemia     - from GU loss?     - CT ab/pelvis with no acute findings as noted above     - trend Hgb, transfuse for Hgb <7     -Repeat CBC ordered for a.m.   DVT prophylaxis: SCDs Code Status: Full Family Communication: Discussed with daughter on phone 6/11 Disposition Plan:  Status is: Inpatient Remains inpatient appropriate because: Need for close monitoring and IV medications.   Consultants:  Vascular surgery Case discussed with urology on admission  Procedures:  IVC filter placement 6/11  Antimicrobials:  Anti-infectives (From admission, onward)    Start     Dose/Rate Route Frequency Ordered Stop   12/10/21 1800  cefTRIAXone (ROCEPHIN) 2 g in sodium chloride 0.9 % 100 mL  IVPB  Status:  Discontinued        2 g 200 mL/hr over 30 Minutes Intravenous Every 24 hours 12/09/21 1710 12/09/21 1716   12/10/21 1400  cefTRIAXone (ROCEPHIN) 2 g in sodium chloride 0.9 % 100 mL IVPB        2 g 200 mL/hr over 30 Minutes Intravenous Every 24 hours 12/09/21 1716     12/09/21 1400  cefTRIAXone (ROCEPHIN) 1 g in sodium chloride 0.9 % 100 mL IVPB        1 g 200 mL/hr over 30 Minutes Intravenous  Once 12/09/21 1356 12/09/21 1459       Subjective: Patient seen and evaluated today with no new acute complaints or concerns. No acute concerns or events noted overnight.  No further hematuria noted from suprapubic catheter this morning.  He has just returned  from IVC filter placement with no concerns noted.  Objective: Vitals:   12/10/21 0730 12/10/21 0827 12/10/21 0842 12/10/21 0901  BP: (!) 148/58 (!) 148/84 127/84 139/80  Pulse: 80 82 77 65  Resp:  12 (!) 21 13  Temp: 98.3 F (36.8 C) (!) 97.5 F (36.4 C) 97.6 F (36.4 C) 98.3 F (36.8 C)  TempSrc:    Oral  SpO2: 96% 94% 98% 95%    Intake/Output Summary (Last 24 hours) at 12/10/2021 1116 Last data filed at 12/10/2021 0904 Gross per 24 hour  Intake 1018.02 ml  Output 500 ml  Net 518.02 ml   There were no vitals filed for this visit.  Examination:  General exam: Appears calm and comfortable  Respiratory system: Clear to auscultation. Respiratory effort normal. Cardiovascular system: S1 & S2 heard, RRR.  Gastrointestinal system: Abdomen is soft Central nervous system: Alert and awake Extremities: Left lower extremity edema Skin: No significant lesions noted Psychiatry: Flat affect. Suprapubic catheter with urine output clear, yellow    Data Reviewed: I have personally reviewed following labs and imaging studies  CBC: Recent Labs  Lab 12/09/21 1105 12/09/21 1836 12/10/21 0234  WBC 8.7  --  7.6  NEUTROABS 6.3  --   --   HGB 9.6* 10.2* 8.3*  HCT 31.1* 32.0* 27.4*  MCV 88.4  --  88.4  PLT 223  --  989   Basic Metabolic Panel: Recent Labs  Lab 12/09/21 1105 12/10/21 0234  NA 139 135  K 3.9 3.8  CL 104 104  CO2 24 24  GLUCOSE 109* 95  BUN 13 10  CREATININE 1.16 1.19  CALCIUM 9.0 7.9*   GFR: CrCl cannot be calculated (Unknown ideal weight.). Liver Function Tests: Recent Labs  Lab 12/09/21 1105 12/10/21 0234  AST 18 16  ALT 9 10  ALKPHOS 67 53  BILITOT 0.7 0.8  PROT 7.1 5.8*  ALBUMIN 3.5 2.7*   No results for input(s): "LIPASE", "AMYLASE" in the last 168 hours. No results for input(s): "AMMONIA" in the last 168 hours. Coagulation Profile: No results for input(s): "INR", "PROTIME" in the last 168 hours. Cardiac Enzymes: No results for input(s):  "CKTOTAL", "CKMB", "CKMBINDEX", "TROPONINI" in the last 168 hours. BNP (last 3 results) No results for input(s): "PROBNP" in the last 8760 hours. HbA1C: Recent Labs    12/09/21 1836  HGBA1C 5.1   CBG: Recent Labs  Lab 12/10/21 0626 12/10/21 0829  GLUCAP 88 79   Lipid Profile: No results for input(s): "CHOL", "HDL", "LDLCALC", "TRIG", "CHOLHDL", "LDLDIRECT" in the last 72 hours. Thyroid Function Tests: Recent Labs    12/09/21 1105  TSH 0.682  Anemia Panel: Recent Labs    12/09/21 1836  TIBC 304  IRON 19*   Sepsis Labs: Recent Labs  Lab 12/09/21 1105 12/09/21 1836  LATICACIDVEN 1.3 1.5    No results found for this or any previous visit (from the past 240 hour(s)).       Radiology Studies: DG Abd 1 View  Result Date: 12/10/2021 CLINICAL DATA:  IVC filter insertion. EXAM: ABDOMEN - 1 VIEW COMPARISON:  CT abdomen and pelvis 12/09/2021 FLUOROSCOPY: Fluoroscopy Time: 37 seconds Radiation Exposure Index: 8.3 mGy FINDINGS: Intraoperative fluoroscopic images are submitted during IVC filter insertion. The final image demonstrates the superior aspect of the filter to be at the mid to lower L2 vertebral body level. IMPRESSION: Intraoperative images during IVC filter placement. Electronically Signed   By: Logan Bores M.D.   On: 12/10/2021 11:10   VAS [place] LOWER EXTREMITY VENOUS (DVT) (ONLY [facility] & [facility])  Result Date: 12/10/2021  Lower Venous DVT Study Patient Name:  KENDELL SAGRAVES  Date of Exam:   12/09/2021 Medical Rec #: [id number]       Accession #:    [id number] Date of Birth: 09-30-1945        Patient Gender: M Patient Age:   70 years Exam Location:  [facility] Procedure:      VAS [place] LOWER EXTREMITY VENOUS (DVT) Referring Phys: Marda Stalker --------------------------------------------------------------------------------  Indications: Swelling.  Risk Factors: Prostate CA Afib - has been off anticoagulants due to hematuria. Comparison Study: No previous exams  Performing Technologist: Jody Hill RVT, RDMS  Examination Guidelines: A complete evaluation includes B-mode imaging, spectral Doppler, color Doppler, and power Doppler as needed of all accessible portions of each vessel. Bilateral testing is considered an integral part of a complete examination. Limited examinations for reoccurring indications may be performed as noted. The reflux portion of the exam is performed with the patient in reverse Trendelenburg.  +-----+---------------+---------+-----------+----------+--------------+ RIGHTCompressibilityPhasicitySpontaneityPropertiesThrombus Aging +-----+---------------+---------+-----------+----------+--------------+ CFV  Full           Yes      Yes                                 +-----+---------------+---------+-----------+----------+--------------+   +---------+---------------+---------+-----------+----------+--------------+ LEFT     CompressibilityPhasicitySpontaneityPropertiesThrombus Aging +---------+---------------+---------+-----------+----------+--------------+ CFV      Full           Yes      Yes                                 +---------+---------------+---------+-----------+----------+--------------+ SFJ      Full                                                        +---------+---------------+---------+-----------+----------+--------------+ FV Prox  Full           Yes      Yes                                 +---------+---------------+---------+-----------+----------+--------------+ FV Mid   None           No       No  Acute          +---------+---------------+---------+-----------+----------+--------------+ FV DistalNone           No       No                   Acute          +---------+---------------+---------+-----------+----------+--------------+ PFV      Full                                                         +---------+---------------+---------+-----------+----------+--------------+ POP      None           No       No                   Acute          +---------+---------------+---------+-----------+----------+--------------+ PTV      None           No       No                   Acute          +---------+---------------+---------+-----------+----------+--------------+ PERO     None           No       No                   Acute          +---------+---------------+---------+-----------+----------+--------------+ Gastroc  None           No       No                   Acute          +---------+---------------+---------+-----------+----------+--------------+ SSV      None           No       No                   Acute          +---------+---------------+---------+-----------+----------+--------------+    Summary: RIGHT: - No evidence of common femoral vein obstruction.  LEFT: - Findings consistent with acute deep vein thrombosis involving the left femoral vein, left popliteal vein, left posterior tibial veins, left peroneal veins, and left gastrocnemius veins. - Findings consistent with acute superficial vein thrombosis involving the left small saphenous vein. - No cystic structure found in the popliteal fossa.  *See table(s) above for measurements and observations. Electronically signed by Servando Snare MD on 12/10/2021 at 10:08:37 AM.    Final    CT ABDOMEN PELVIS W CONTRAST  Result Date: 12/09/2021 CLINICAL DATA:  Macroscopic hematuria on urinalysis. Possible bladder mass. EXAM: CT ABDOMEN AND PELVIS WITH CONTRAST TECHNIQUE: Multidetector CT imaging of the abdomen and pelvis was performed using the standard protocol following bolus administration of intravenous contrast. RADIATION DOSE REDUCTION: This exam was performed according to the departmental dose-optimization program which includes automated exposure control, adjustment of the mA and/or kV according to patient size and/or use of  iterative reconstruction technique. CONTRAST:  184m OMNIPAQUE IOHEXOL 300 MG/ML  SOLN COMPARISON:  02/22/2020. FINDINGS: Lower chest: Clear lung bases. Hepatobiliary: No focal liver abnormality is seen. No gallstones, gallbladder wall thickening, or biliary dilatation. Pancreas: Unremarkable. No pancreatic ductal  dilatation or surrounding inflammatory changes. Spleen: Normal in size without focal abnormality. Adrenals/Urinary Tract: Small left adrenal nodule, 1.2 cm, unchanged. Normal right adrenal gland. Kidneys normal in size, orientation and position with symmetric enhancement and excretion. Several bilateral low-attenuation renal masses, largest on the right, posterior midpole, 3 cm, and largest on the left, posteromedial mid to upper pole, 2.6 cm. Masses are consistent with cysts. No intrarenal stones. No hydronephrosis. Mild dilation of the left ureter. No ureteral stone. Right ureter normal in course and in caliber. Suprapubic Foley catheter lies within the bladder. Surgical vascular clips lie along the right aspect of the decompressed bladder. Possible ill-defined mass along the posterior aspect of the bladder, approximately 5.3 cm transversely. Stomach/Bowel: Normal stomach. Small bowel and colon are normal in caliber. No wall thickening. No inflammation. Vascular/Lymphatic: Aortic atherosclerosis. No aneurysm. No enlarged lymph nodes. Reproductive: Enlarged prostate, upper margin poorly defined. Prostate bulging into the bladder base may account for the apparent bladder mass. Prostate measures 5.3 cm transversely, containing fiducial markers, unchanged from the prior CT. Other: No abdominal wall hernia or abnormality. No abdominopelvic ascites. Musculoskeletal: No acute fracture.  No bone lesion. IMPRESSION: 1. Bladder decompressed with a suprapubic Foley catheter, limiting its assessment. Possible bladder mass versus enlarged prostate bulging into the bladder base. 2. Mild dilation of the left ureter,  without a ureteral stone. No hydronephrosis. 3. Low-attenuation renal masses consistent with cysts. 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 4. Aortic atherosclerosis. Electronically Signed   By: Lajean Manes M.D.   On: 12/09/2021 15:23   DG Chest Portable 1 View  Result Date: 12/09/2021 CLINICAL DATA:  Pt BIB EMS from home. Pt has a suprapubic catheter in place (came out yesterday, and pts daughter replaced it). Chronic left leg pain, PCP concerned for DVT. Left leg swelling started yesterday. EMS reports cloudy urine in bag. Pt hx of diabetes. EXAM: PORTABLE CHEST 1 VIEW COMPARISON:  02/24/2020 and older studies. FINDINGS: Cardiac silhouette is normal in size and configuration. No mediastinal or hilar masses. Lungs hyperexpanded, but clear. No pleural effusion or pneumothorax. Skeletal structures are grossly intact. IMPRESSION: No active disease. Electronically Signed   By: Lajean Manes M.D.   On: 12/09/2021 12:04        Scheduled Meds:  finasteride  5 mg Oral Daily   folic acid  1 mg Oral Daily   gabapentin  300 mg Oral TID   lisinopril  40 mg Oral Daily   magnesium oxide  400 mg Oral Daily   metoprolol succinate  100 mg Oral Daily   vitamin B-12  1,000 mcg Oral Daily   Continuous Infusions:  cefTRIAXone (ROCEPHIN)  IV       LOS: 1 day    Time spent: 35 minutes    Honey Zakarian Darleen Crocker, DO [facility]  If 7PM-7AM, please contact night-coverage [email or web address] 12/10/2021, 11:16 AM

## 2021-12-10 NOTE — Transfer of Care (Signed)
Immediate Anesthesia Transfer of Care Note  Patient: [name]  Procedure(s) Performed: INSERTION VENA-CAVA FILTER Right Femoral (Right: Groin)  Patient Location: PACU  Anesthesia Type:General  Level of Consciousness: awake, alert  and oriented  Airway & Oxygen Therapy: Patient Spontanous Breathing  Post-op Assessment: Report given to RN and Post -op Vital signs reviewed and stable  Post vital signs: Reviewed and stable  Last Vitals:  Vitals Value Taken Time  BP 148/84 12/10/21 0827  Temp    Pulse 77 12/10/21 0830  Resp 14 12/10/21 0830  SpO2 96 % 12/10/21 0830  Vitals shown include unvalidated device data.  Last Pain:  Vitals:   12/10/21 0540  TempSrc:   PainSc: 1          Complications: No notable events documented.

## 2021-12-10 NOTE — Anesthesia Procedure Notes (Signed)
Procedure Name: Intubation Date/Time: 12/10/2021 7:50 AM  Performed by: Eligha Bridegroom, CRNAPre-anesthesia Checklist: Patient identified, Emergency Drugs available, Suction available, Patient being monitored and Timeout performed Oxygen Delivery Method: Circle system utilized Preoxygenation: Pre-oxygenation with 100% oxygen Induction Type: IV induction Laryngoscope Size: Mac and 4 Grade View: Grade I Tube type: Oral Tube size: 8.0 mm Number of attempts: 1 Airway Equipment and Method: Stylet Placement Confirmation: ETT inserted through vocal cords under direct vision, positive ETCO2 and breath sounds checked- equal and bilateral Secured at: 22 cm Tube secured with: Tape Dental Injury: Teeth and Oropharynx as per pre-operative assessment

## 2021-12-10 NOTE — Op Note (Signed)
    Patient name: [name] MRN: [id number] DOB: Nov 18, 1945 Sex: male  12/10/2021 Pre-operative Diagnosis: Extensive left lower extremity DVT with contraindication anticoagulation Post-operative diagnosis:  Same Surgeon:  Erlene Quan C. Donzetta Matters, MD Procedure Performed: 1.  Ultrasound-guided cannulation right common femoral vein 2.  IVC venogram 3.  Placement of Cook Celect IVC filter infrarenal position  Indications: 76 year old male with history of hematuria and normocytic anemia with contraindication anticoagulation now with extensive left lower extremity DVT indicated for IVC filter placement.  Findings: Renal veins were at the level of L2 confirmed with IVC venogram.  Filter was placed at the mid to lower third of L2.   Procedure:  The patient was identified in the holding area and taken to the operating room where is placed supine on upper table and general anesthesia was induced.  He was sterilely prepped and draped in the right groin in the usual fashion, antibiotics were minister timeout was called.  Ultrasound was used to identify the right common femoral vein which was patent and compressible and this was cannulated with an 18-gauge needle and a Bentson wire was placed centrally.  An introducer sheath was placed to the level of L2 venogram was performed with fluoroscopic guidance.  We then placed the filter to the low level of L2 just below the renal veins marked on the screen and deployed this.  It deployed well with the hook in the center of the IVC.  The introducer sheath was then removed and a suture bolster was placed in the right groin and this was hemostatic.  He was then awakened from anesthesia having tolerated procedure without any complication.  All counts were correct at completion.  Contrast: 10 cc  EBL: minimal  Johnnye Sandford C. Donzetta Matters, MD [facility] Office: [phone] Pager: [phone]

## 2021-12-10 NOTE — Plan of Care (Signed)

## 2021-12-11 ENCOUNTER — Encounter (HOSPITAL_COMMUNITY): Payer: Self-pay | Admitting: Vascular Surgery

## 2021-12-11 DIAGNOSIS — I82402 Acute embolism and thrombosis of unspecified deep veins of left lower extremity: Secondary | ICD-10-CM | POA: Diagnosis not present

## 2021-12-11 DIAGNOSIS — I10 Essential (primary) hypertension: Secondary | ICD-10-CM

## 2021-12-11 DIAGNOSIS — R310 Gross hematuria: Secondary | ICD-10-CM | POA: Diagnosis not present

## 2021-12-11 LAB — BASIC METABOLIC PANEL
Anion gap: 8 (ref 5–15)
BUN: 24 mg/dL — ABNORMAL HIGH (ref 8–23)
CO2: 24 mmol/L (ref 22–32)
Calcium: 9 mg/dL (ref 8.9–10.3)
Chloride: 106 mmol/L (ref 98–111)
Creatinine, Ser: 1.14 mg/dL (ref 0.61–1.24)
GFR, Estimated: >60 mL/min (ref >60)
Glucose, Bld: 142 mg/dL — ABNORMAL HIGH (ref 70–99)
Potassium: 5.3 mmol/L — ABNORMAL HIGH (ref 3.5–5.1)
Sodium: 138 mmol/L (ref 135–145)

## 2021-12-11 LAB — CBC
HCT: 28.2 % — ABNORMAL LOW (ref 39.0–52.0)
Hemoglobin: 8.8 g/dL — ABNORMAL LOW (ref 13.0–17.0)
MCH: 27.2 pg (ref 26.0–34.0)
MCHC: 31.2 g/dL (ref 30.0–36.0)
MCV: 87 fL (ref 80.0–100.0)
Platelets: 251 10*3/uL (ref 150–400)
RBC: 3.24 MIL/uL — ABNORMAL LOW (ref 4.22–5.81)
RDW: 14 % (ref 11.5–15.5)
WBC: 8.7 10*3/uL (ref 4.0–10.5)
nRBC: 0 % (ref 0.0–0.2)

## 2021-12-11 LAB — GLUCOSE, CAPILLARY: Glucose-Capillary: 133 mg/dL — ABNORMAL HIGH (ref 70–99)

## 2021-12-11 LAB — POTASSIUM: Potassium: 4.3 mmol/L (ref 3.5–5.1)

## 2021-12-11 LAB — MAGNESIUM: Magnesium: 2.5 mg/dL — ABNORMAL HIGH (ref 1.7–2.4)

## 2021-12-11 MED ORDER — OXYCODONE HCL 5 MG PO TABS: 5.0000 mg | ORAL_TABLET | Freq: Four times a day (QID) | ORAL | 0 refills | Status: AC | PRN

## 2021-12-11 NOTE — Evaluation (Addendum)
Physical Therapy Evaluation Patient Details Name: Nathan [name] MRN: [id number] DOB: Aug 09, 1945 Today's Date: 12/11/2021  History of Present Illness  Pt is a 76 y/o male admitted secondary to LLE pain on 6/10. 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 PMH includes prostate cancer, DM, HTN, a fib, and s/p suprapubic catheter.  Clinical Impression  Pt admitted secondary to problem above with deficits below. Pt requiring min guard to supervision assist with mobility tasks this session. Mild unsteadiness noted and required use of RW. Pt reports daughter and wife available to assist at home. Will continue to follow acutely.        Recommendations for follow up therapy are one component of a multi-disciplinary discharge planning process, led by the attending physician.  Recommendations may be updated based on patient status, additional functional criteria and insurance authorization.  Follow Up Recommendations Home health PT    Assistance Recommended at Discharge Intermittent Supervision/Assistance  Patient can return home with the following  Assist for transportation;Assistance with cooking/housework    Equipment Recommendations Rollator  Recommendations for Other Services       Functional Status Assessment Patient has had a recent decline in their functional status and demonstrates the ability to make significant improvements in function in a reasonable and predictable amount of time.     Precautions / Restrictions Precautions Precautions: Fall Precaution Comments: suprapubic catheter Restrictions Weight Bearing Restrictions: No      Mobility  Bed Mobility Overal bed mobility: Modified Independent                  Transfers Overall transfer level: Needs assistance Equipment used: Rolling walker (2 wheels) Transfers: Sit to/from Stand Sit to Stand: Supervision           General transfer comment: Supervision for safety. Cues for hand  placement    Ambulation/Gait Ambulation/Gait assistance: Min guard, Supervision Gait Distance (Feet): 150 Feet Assistive device: Rolling walker (2 wheels) Gait Pattern/deviations: Step-through pattern, Decreased stride length Gait velocity: Decreased     General Gait Details: Mild unsteadiness noted throughout and requiring UE support on RW for balance. Mild LOB noted in hallway, but able to correct with min guard.  Stairs Stairs: Yes Stairs assistance: Min guard Stair Management: One rail Right, Alternating pattern, Step to pattern, Forwards Number of Stairs: 10 General stair comments: Used alternating pattern for ascending and step to pattern for descending. Cues for LE sequencing. Min guard for safety.  Wheelchair Mobility    Modified Rankin (Stroke Patients Only)       Balance Overall balance assessment: Mild deficits observed, not formally tested                                           Pertinent Vitals/Pain Pain Assessment Pain Assessment: Faces Faces Pain Scale: Hurts little more Pain Location: LLE Pain Descriptors / Indicators: Grimacing, Guarding Pain Intervention(s): Monitored during session, Limited activity within patient's tolerance, Repositioned    Home Living Family/patient expects to be discharged to:: Private residence Living Arrangements: Children;Spouse/significant other (daughter) Available Help at Discharge: Family;Available 24 hours/day Type of Home: House Home Access: Stairs to enter Entrance Stairs-Rails: Right Entrance Stairs-Number of Steps: 2 Alternate Level Stairs-Number of Steps: 14 Home Layout: Two level;Bed/bath upstairs Home Equipment: Cane - single point;Grab bars - tub/shower;Grab bars - toilet;Shower seat      Prior Function Prior  Level of Function : Independent/Modified Independent               ADLs Comments: family assists with IADLS     Hand Dominance        Extremity/Trunk Assessment    Upper Extremity Assessment Upper Extremity Assessment: Defer to OT evaluation    Lower Extremity Assessment Lower Extremity Assessment: Generalized weakness;LLE deficits/detail LLE Deficits / Details: LLE swelling, but pt reports it has improved.    Cervical / Trunk Assessment Cervical / Trunk Assessment: Normal  Communication   Communication: No difficulties  Cognition Arousal/Alertness: Awake/alert Behavior During Therapy: WFL for tasks assessed/performed Overall Cognitive Status: Within Functional Limits for tasks assessed                                          General Comments      Exercises     Assessment/Plan    PT Assessment Patient needs continued PT services  PT Problem List Decreased strength;Decreased balance;Decreased mobility;Decreased knowledge of precautions;Pain       PT Treatment Interventions DME instruction;Gait training;Stair training;Functional mobility training;Therapeutic activities;Therapeutic exercise;Balance training;Patient/family education    PT Goals (Current goals can be found in the Care Plan section)  Acute Rehab PT Goals Patient Stated Goal: to go home PT Goal Formulation: With patient Time For Goal Achievement: 12/25/21 Potential to Achieve Goals: Good    Frequency Min 3X/week     Co-evaluation               AM-PAC PT "6 Clicks" Mobility  Outcome Measure Help needed turning from your back to your side while in a flat bed without using bedrails?: None Help needed moving from lying on your back to sitting on the side of a flat bed without using bedrails?: None Help needed moving to and from a bed to a chair (including a wheelchair)?: A Little Help needed standing up from a chair using your arms (e.g., wheelchair or bedside chair)?: A Little Help needed to walk in hospital room?: A Little Help needed climbing 3-5 steps with a railing? : A Little 6 Click Score: 20    End of Session Equipment Utilized  During Treatment: Gait belt Activity Tolerance: Patient tolerated treatment well Patient left: with call bell/phone within reach;in chair;with chair alarm set Nurse Communication: Mobility status PT Visit Diagnosis: Unsteadiness on feet (R26.81)    Time: 2458-0998 PT Time Calculation (min) (ACUTE ONLY): 10 min   Charges:   PT Evaluation $PT Eval Low Complexity: 1 Low          Lou Miner, DPT  Acute Rehabilitation Services  Office: [phone]   Rudean Hitt 12/11/2021, 11:08 AM

## 2021-12-11 NOTE — TOC Transition Note (Signed)
Transition of Care (TOC) - CM/SW Discharge Note [name] RN, BSN Transitions of Care Unit 4E- RN Case Manager See Treatment Team for direct phone #    Patient Details  Name: Nathan [name] MRN: [id number] Date of Birth: 07/21/1945  Transition of Care [facility]) CM/SW Contact:  [name], RN Phone Number: 12/11/2021, 2:37 PM   Clinical Narrative:    Pt stable for transition home today, received msg from bedside nursing that daughter requesting rollator for home.  CM went to bedside to speak with patient, daughter no longer at bedside. Per pt he lives with daughter- moved here from [place]. Pt reports that they spoke about rollator which he is agreeable to. Also discussed recommendations for HHPT- which pt states this writer should speak with his daughter about. Permission given to call daughter [name].   Address, phone # confirmed with pt in epic.   Call made to [facility], with msg left for return call [phone]- return call received and discussed HH and DME- per conversation daughter states pt is already active with [facility] for suprapubic cath needs- and would like to remain with them for [facility] needs. Will ask MD to add RN to HHPT orders for resumption of nursing needs in the home.  Daughter agreeable to use in house provider for DME needs. Daughter will provide transportation home later when she gets off work.   Call made to [facility] for DME need- rollator to be delivered to room prior to discharge.  Call made to [place] with [facility]-  confirmed [facility] active- they can resume services and add HHPT. MD to modify order.    Final next level of care: [place] Barriers to Discharge: No Barriers Identified   Patient Goals and CMS Choice Patient states their goals for this hospitalization and ongoing recovery are:: return home CMS Medicare.gov Compare Post Acute Care list provided to:: Patient Choice offered to / list presented to : Patient, Adult  Children  Discharge Placement               Home w/ [facility]        Discharge Plan and Services   Discharge Planning Services: CM Consult Post Acute Care Choice: Durable Medical Equipment, Home Health          DME Arranged: Walker rolling with seat DME Agency: [facility] Date DME Agency Contacted: 12/11/21 Time DME Agency Contacted: 1300 Representative spoke with at DME Agency: Jodell Cipro HH Arranged: RN, PT [place] Agency: [place] Date [place]: 12/11/21 Time [place]: 1330 Representative spoke with at [place]: [place] ([place]) Interventions     Readmission Risk Interventions    12/11/2021    2:36 PM  Readmission Risk Prevention Plan  Post Dischage Appt Complete  Medication Screening Complete  Transportation Screening Complete

## 2021-12-11 NOTE — Discharge Summary (Signed)
Physician Discharge Summary   Patient: Nathan [name] MRN: [id number] DOB: July 22, 1945  Admit date:     12/09/2021  Discharge date: 12/11/21  Discharge Physician: Nathan Poche, MD   PCP: Nathan Rasmussen, MD   Recommendations at discharge:  Outpatient follow-up with PCP and Urology Initiate anticoagulation as soon as able  Discharge Diagnoses: Principal Problem:   Acute DVT (deep venous thrombosis) (HCC) Active Problems:   Atrial fibrillation (HCC)   Malignant neoplasm of prostate (HCC)   Hematuria   Urinary retention   UTI (urinary tract infection)   HTN (hypertension)   DM2 (diabetes mellitus, type 2) (HCC)   Normocytic anemia  Resolved Problems:   * No resolved hospital problems. [facility] Course: No notes on file  Assessment and Plan:  LLE DVT In setting of history of prostate cancer. Complicated by chronic hematuria. Vascular surgery was consulted and performed successful IVC filter placement on 6/11. PT/OT recommending a 4-wheel walker with seat and home health PT/OT. Patient discharged with recommendations to follow-up with PCP and urologist.  Hematuria Recurrent issue. Patient follows with urology. Discussed case with urology on day of discharge. Recommendation to follow-up with outpatient urologist. Hemoglobin is stable.  Abnormal urinalysis Initial concern for UTI. Multiple species on urine culture. Patient empirically treated with Ceftriaxone which was discontinued on discharge.  Atrial fibrillation, unspecified Not on anticoagulation. Continue metoprolol. Stable.  Primary hypertension Continue lisinopril and metoprolol  Diabetes mellitus, type 2 Hemoglobin A1C of 5.1%. Diet modification only.  Acute blood loss anemia Most recent hemoglobin of 13 from 11/22. Hemoglobin of 9.6 on admission which has remained stable. Secondary to hematuria. No blood transfusion required.   Consultants: Vascular surgery Procedures performed: IVC filter placement   Disposition: Home health Diet recommendation: Cardiac diet. Carb modified  DISCHARGE MEDICATION: Allergies as of 12/11/2021   No Known Allergies      Medication List     TAKE these medications    finasteride 5 MG tablet Commonly known as: PROSCAR Take 5 mg by mouth daily.   folic acid 1 MG tablet Commonly known as: FOLVITE Take 1 mg by mouth daily.   gabapentin 300 MG capsule Commonly known as: NEURONTIN Take 300 mg by mouth 3 (three) times daily.   lisinopril 40 MG tablet Commonly known as: ZESTRIL Take 40 mg by mouth daily.   magnesium oxide 400 MG tablet Commonly known as: MAG-OX Take 400 mg by mouth daily.   metoprolol succinate 100 MG 24 hr tablet Commonly known as: TOPROL-XL Take 100 mg by mouth daily.   oxyCODONE 5 MG immediate release tablet Commonly known as: Oxy IR/ROXICODONE Take 1 tablet (5 mg total) by mouth every 6 (six) hours as needed for up to 5 days for severe pain.   prenatal multivitamin Tabs tablet Take 1 tablet by mouth daily at 12 noon.   vitamin B-12 1000 MCG tablet Commonly known as: CYANOCOBALAMIN Take 1,000 mcg by mouth daily.   VITAMIN D2 PO Take 50,000 Units by mouth daily.               Durable Medical Equipment  (From admission, onward)           Start     Ordered   12/11/21 1254  For home use only DME 4 wheeled rolling walker with seat  Once       Question:  Patient needs a walker to treat with the following condition  Answer:  Acute deep vein thrombosis (DVT) of left lower extremity, unspecified  vein [facility])   12/11/21 1253            Follow-up Information     Nathan Rasmussen, MD. Schedule an appointment as soon as possible for a visit in 4 week(s).   Specialty: Family Medicine Why: For hospital follow-up Contact information: [place] [address] [phone]         Nathan Bring, MD. Schedule an appointment as soon as possible for a visit in 1 month(s).    Specialty: Urology Contact information: [place] [address] [phone]         Nathan Sandy, MD. Schedule an appointment as soon as possible for a visit.   Specialties: Vascular Surgery, Cardiology Why: As needed Contact information: [place] [address] [phone]                Discharge Exam: BP 107/76 (BP Location: Left Arm)   Pulse 76   Temp 98.3 F (36.8 C) (Oral)   Resp 20   SpO2 96%   General exam: Appears calm and comfortable Respiratory system: Clear to auscultation. Respiratory effort normal. Cardiovascular system: S1 & S2 heard Gastrointestinal system: Abdomen is non-distended Central nervous system: Alert and oriented. No focal neurological deficits. Musculoskeletal: LLE 1+ pitting edema. No calf tenderness Skin: No cyanosis. No rashes Psychiatry: Judgement and insight appear normal. Mood & affect appropriate.   Condition at discharge: stable  The results of significant diagnostics from this hospitalization (including imaging, microbiology, ancillary and laboratory) are listed below for reference.   Imaging Studies: DG Abd 1 View  Result Date: 12/10/2021 CLINICAL DATA:  DVT. EXAM: ABDOMEN - 1 VIEW COMPARISON:  Abdomen pelvis CT 12/09/2021 FINDINGS: Nonspecific bowel gas pattern. IVC filter visualized in situ. Suprapubic catheter noted with contrast material in the bladder lumen. IMPRESSION: Nonspecific bowel gas pattern. Electronically Signed   By: Misty Stanley M.D.   On: 12/10/2021 11:33   DG Abd 1 View  Result Date: 12/10/2021 CLINICAL DATA:  IVC filter insertion. EXAM: ABDOMEN - 1 VIEW COMPARISON:  CT abdomen and pelvis 12/09/2021 FLUOROSCOPY: Fluoroscopy Time: 37 seconds Radiation Exposure Index: 8.3 mGy FINDINGS: Intraoperative fluoroscopic images are submitted during IVC filter insertion. The final image demonstrates the superior aspect of the filter to be at the mid to lower L2 vertebral body  level. IMPRESSION: Intraoperative images during IVC filter placement. Electronically Signed   By: Logan Bores M.D.   On: 12/10/2021 11:10   VAS [place] LOWER EXTREMITY VENOUS (DVT) (ONLY [facility] & [facility])  Result Date: 12/10/2021  Lower Venous DVT Study Patient Name:  Nathan [name]  Date of Exam:   12/09/2021 Medical Rec #: [id number]       Accession #:    [id number] Date of Birth: 1946-01-15        Patient Gender: M Patient Age:   69 years Exam Location:  [facility] Procedure:      VAS [place] LOWER EXTREMITY VENOUS (DVT) Referring Phys: Marda Stalker --------------------------------------------------------------------------------  Indications: Swelling.  Risk Factors: Prostate CA Afib - has been off anticoagulants due to hematuria. Comparison Study: No previous exams Performing Technologist: Jody Hill RVT, RDMS  Examination Guidelines: A complete evaluation includes B-mode imaging, spectral Doppler, color Doppler, and power Doppler as needed of all accessible portions of each vessel. Bilateral testing is considered an integral part of a complete examination. Limited examinations for reoccurring indications may be performed as noted. The reflux portion of the exam is performed  with the patient in reverse Trendelenburg.  +-----+---------------+---------+-----------+----------+--------------+ RIGHTCompressibilityPhasicitySpontaneityPropertiesThrombus Aging +-----+---------------+---------+-----------+----------+--------------+ CFV  Full           Yes      Yes                                 +-----+---------------+---------+-----------+----------+--------------+   +---------+---------------+---------+-----------+----------+--------------+ LEFT     CompressibilityPhasicitySpontaneityPropertiesThrombus Aging +---------+---------------+---------+-----------+----------+--------------+ CFV      Full           Yes      Yes                                  +---------+---------------+---------+-----------+----------+--------------+ SFJ      Full                                                        +---------+---------------+---------+-----------+----------+--------------+ FV Prox  Full           Yes      Yes                                 +---------+---------------+---------+-----------+----------+--------------+ FV Mid   None           No       No                   Acute          +---------+---------------+---------+-----------+----------+--------------+ FV DistalNone           No       No                   Acute          +---------+---------------+---------+-----------+----------+--------------+ PFV      Full                                                        +---------+---------------+---------+-----------+----------+--------------+ POP      None           No       No                   Acute          +---------+---------------+---------+-----------+----------+--------------+ PTV      None           No       No                   Acute          +---------+---------------+---------+-----------+----------+--------------+ PERO     None           No       No                   Acute          +---------+---------------+---------+-----------+----------+--------------+ Gastroc  None           No       No  Acute          +---------+---------------+---------+-----------+----------+--------------+ SSV      None           No       No                   Acute          +---------+---------------+---------+-----------+----------+--------------+    Summary: RIGHT: - No evidence of common femoral vein obstruction.  LEFT: - Findings consistent with acute deep vein thrombosis involving the left femoral vein, left popliteal vein, left posterior tibial veins, left peroneal veins, and left gastrocnemius veins. - Findings consistent with acute superficial vein thrombosis involving the left small  saphenous vein. - No cystic structure found in the popliteal fossa.  *See table(s) above for measurements and observations. Electronically signed by Servando Snare MD on 12/10/2021 at 10:08:37 AM.    Final    CT ABDOMEN PELVIS W CONTRAST  Result Date: 12/09/2021 CLINICAL DATA:  Macroscopic hematuria on urinalysis. Possible bladder mass. EXAM: CT ABDOMEN AND PELVIS WITH CONTRAST TECHNIQUE: Multidetector CT imaging of the abdomen and pelvis was performed using the standard protocol following bolus administration of intravenous contrast. RADIATION DOSE REDUCTION: This exam was performed according to the departmental dose-optimization program which includes automated exposure control, adjustment of the mA and/or kV according to patient size and/or use of iterative reconstruction technique. CONTRAST:  124m OMNIPAQUE IOHEXOL 300 MG/ML  SOLN COMPARISON:  02/22/2020. FINDINGS: Lower chest: Clear lung bases. Hepatobiliary: No focal liver abnormality is seen. No gallstones, gallbladder wall thickening, or biliary dilatation. Pancreas: Unremarkable. No pancreatic ductal dilatation or surrounding inflammatory changes. Spleen: Normal in size without focal abnormality. Adrenals/Urinary Tract: Small left adrenal nodule, 1.2 cm, unchanged. Normal right adrenal gland. Kidneys normal in size, orientation and position with symmetric enhancement and excretion. Several bilateral low-attenuation renal masses, largest on the right, posterior midpole, 3 cm, and largest on the left, posteromedial mid to upper pole, 2.6 cm. Masses are consistent with cysts. No intrarenal stones. No hydronephrosis. Mild dilation of the left ureter. No ureteral stone. Right ureter normal in course and in caliber. Suprapubic Foley catheter lies within the bladder. Surgical vascular clips lie along the right aspect of the decompressed bladder. Possible ill-defined mass along the posterior aspect of the bladder, approximately 5.3 cm transversely. Stomach/Bowel:  Normal stomach. Small bowel and colon are normal in caliber. No wall thickening. No inflammation. Vascular/Lymphatic: Aortic atherosclerosis. No aneurysm. No enlarged lymph nodes. Reproductive: Enlarged prostate, upper margin poorly defined. Prostate bulging into the bladder base may account for the apparent bladder mass. Prostate measures 5.3 cm transversely, containing fiducial markers, unchanged from the prior CT. Other: No abdominal wall hernia or abnormality. No abdominopelvic ascites. Musculoskeletal: No acute fracture.  No bone lesion. IMPRESSION: 1. Bladder decompressed with a suprapubic Foley catheter, limiting its assessment. Possible bladder mass versus enlarged prostate bulging into the bladder base. 2. Mild dilation of the left ureter, without a ureteral stone. No hydronephrosis. 3. Low-attenuation renal masses consistent with cysts. 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 4. Aortic atherosclerosis. Electronically Signed   By: DLajean ManesM.D.   On: 12/09/2021 15:23   DG Chest Portable 1 View  Result Date: 12/09/2021 CLINICAL DATA:  Pt BIB EMS from home. Pt has a suprapubic catheter in place (came out yesterday, and pts daughter replaced it). Chronic left leg pain, PCP concerned for DVT. Left leg swelling started yesterday. EMS reports cloudy urine in bag.  Pt hx of diabetes. EXAM: PORTABLE CHEST 1 VIEW COMPARISON:  02/24/2020 and older studies. FINDINGS: Cardiac silhouette is normal in size and configuration. No mediastinal or hilar masses. Lungs hyperexpanded, but clear. No pleural effusion or pneumothorax. Skeletal structures are grossly intact. IMPRESSION: No active disease. Electronically Signed   By: Lajean Manes M.D.   On: 12/09/2021 12:04    Microbiology: Results for orders placed or performed during the hospital encounter of 12/09/21  Urine Culture     Status: Abnormal   Collection Time: 12/09/21 11:00 AM   Specimen: Urine, Clean Catch  Result  Value Ref Range Status   Specimen Description   Final    URINE, CLEAN CATCH Performed at [facility], [place] [address], [place], [place] [address]    Special Requests   Final    NONE Performed at [facility], [place] [address] [place], [place] [address]    Culture MULTIPLE SPECIES PRESENT, SUGGEST RECOLLECTION (A)  Final   Report Status 12/10/2021 FINAL  Final  Blood culture (routine x 2)     Status: None (Preliminary result)   Collection Time: 12/09/21 11:00 AM   Specimen: BLOOD  Result Value Ref Range Status   Specimen Description   Final    BLOOD BLOOD RIGHT WRIST Performed at [place] [address], [place], [place] [address]    Special Requests   Final    BOTTLES DRAWN AEROBIC AND ANAEROBIC Blood Culture results may not be optimal due to an inadequate volume of blood received in culture bottles Performed at [place] [address] [place], [place] [address]    Culture   Final    NO GROWTH 2 DAYS Performed at [facility] [place] [address] [place], [place] [address]    Report Status PENDING  Incomplete  Blood culture (routine x 2)     Status: None (Preliminary result)   Collection Time: 12/09/21 11:02 AM   Specimen: BLOOD  Result Value Ref Range Status   Specimen Description   Final    BLOOD BLOOD LEFT FOREARM Performed at [place] [address], [place], [place] [address]    Special Requests   Final    BOTTLES DRAWN AEROBIC ONLY Blood Culture results may not be optimal due to an inadequate volume of blood received in culture bottles Performed at [facility] [address] [place], [place] [address]    Culture   Final    NO GROWTH 2 DAYS Performed at [facility] [address], [place], [place] [address]    Report Status PENDING  Incomplete  Surgical pcr screen     Status: None   Collection Time: 12/10/21  9:12 AM    Specimen: Nasal Mucosa; Nasal Swab  Result Value Ref Range Status   MRSA, PCR NEGATIVE NEGATIVE Final   Staphylococcus aureus NEGATIVE NEGATIVE Final    Comment: (NOTE) The Xpert SA Assay (FDA approved for NASAL specimens in patients 89 years of age and older), is one component of a comprehensive surveillance program. It is not intended to diagnose infection nor to guide or monitor treatment. Performed at [facility], [place] [address], [place], [place] [address]     Labs: CBC: Recent Labs  Lab 12/09/21 1105 12/09/21 1836 12/10/21 0234 12/11/21 0616  WBC 8.7  --  7.6 8.7  NEUTROABS 6.3  --   --   --   HGB 9.6* 10.2*  8.3* 8.8*  HCT 31.1* 32.0* 27.4* 28.2*  MCV 88.4  --  88.4 87.0  PLT 223  --  217 242   Basic Metabolic Panel: Recent Labs  Lab 12/09/21 1105 12/10/21 0234 12/11/21 0616 12/11/21 0935  NA 139 135 138  --   K 3.9 3.8 5.3* 4.3  CL 104 104 106  --   CO2 '24 24 24  '$ --   GLUCOSE 109* 95 142*  --   BUN 13 10 24*  --   CREATININE 1.16 1.19 1.14  --   CALCIUM 9.0 7.9* 9.0  --   MG  --   --  2.5*  --    Liver Function Tests: Recent Labs  Lab 12/09/21 1105 12/10/21 0234  AST 18 16  ALT 9 10  ALKPHOS 67 53  BILITOT 0.7 0.8  PROT 7.1 5.8*  ALBUMIN 3.5 2.7*   CBG: Recent Labs  Lab 12/10/21 0829 12/10/21 1234 12/10/21 1556 12/10/21 2113 12/11/21 0627  GLUCAP 79 165* 147* 196* 133*    Discharge time spent: 35 minutes.  Signed: Cordelia Poche, MD [facility] 12/11/2021

## 2021-12-11 NOTE — Progress Notes (Signed)
Pt refused compression socks this time. Pt stated that he would put the socks later when he going home.   Lavenia Atlas, RN

## 2021-12-11 NOTE — Progress Notes (Addendum)
  Progress Note    12/11/2021 7:29 AM 1 Day Post-Op  Subjective:  Patient is curious as to everything that is going on with him. He reports he tried to walk yesterday night, however his left calf was still "shooting pain" and kept him from walking. He says the sharp pain in his left lower leg comes and goes.  Vitals:   12/11/21 0025 12/11/21 0417  BP: 125/77 132/78  Pulse: 65 63  Resp: 18 16  Temp: 98.1 F (36.7 C) 98 F (36.7 C)  SpO2: 96%     Physical Exam: Cardiac:  regular Lungs:  nonlabored Incisions:  R groin needle access site covered with gauze, no swelling or hematoma Extremities:  LLE no longer edematous  Suprapubic catheter in place with red urine output  CBC    Component Value Date/Time   WBC 8.7 12/11/2021 0616   RBC 3.24 (L) 12/11/2021 0616   HGB 8.8 (L) 12/11/2021 0616   HCT 28.2 (L) 12/11/2021 0616   PLT 251 12/11/2021 0616   MCV 87.0 12/11/2021 0616   MCH 27.2 12/11/2021 0616   MCHC 31.2 12/11/2021 0616   RDW 14.0 12/11/2021 0616   LYMPHSABS 0.8 12/09/2021 1105   MONOABS 1.5 (H) 12/09/2021 1105   EOSABS 0.0 12/09/2021 1105   BASOSABS 0.0 12/09/2021 1105    BMET    Component Value Date/Time   NA 138 12/11/2021 0616   NA 147 (H) 06/09/2019 1447   K 5.3 (H) 12/11/2021 0616   CL 106 12/11/2021 0616   CO2 24 12/11/2021 0616   GLUCOSE 142 (H) 12/11/2021 0616   BUN 24 (H) 12/11/2021 0616   BUN 16 06/09/2019 1447   CREATININE 1.14 12/11/2021 0616   CALCIUM 9.0 12/11/2021 0616   GFRNONAA >60 12/11/2021 0616   GFRAA >60 02/28/2020 0238    INR    Component Value Date/Time   INR 1.9 (H) 02/23/2020 0911     Intake/Output Summary (Last 24 hours) at 12/11/2021 0729 Last data filed at 12/10/2021 1538 Gross per 24 hour  Intake 730.16 ml  Output 300 ml  Net 430.16 ml     Assessment/Plan:  76 y.o. male is 1 day post-op, s/p: infrarenal IVC filter placement for LLE DVT  -R groin needle access site is c/d/l without swelling or hematoma.  Covered with gauze bandage -Compression stockings have been ordered for DVT prophylaxis -LLE edema resolved, still occasionally painful -Patient still not on anticoagulation d/t hematuria and recurrent significant genitourinary bleeds. Being managed by hospitalist team. -Suprapubic catheter with hematuria output    Vicente Serene, PA-C [facility] [phone] 12/11/2021 7:29 AM  I have independently interviewed and examined patient and agree with PA assessment and plan above.  His left lower extremity thankfully does look somewhat improved.  Would recommend anticoagulation as soon as cleared from GU standpoint.  Filter will remain indefinitely and he can follow-up with me on an as-needed basis.  Kaia Depaolis C. Donzetta Matters, MD [facility] [place] Office: [phone] Pager: [phone]

## 2021-12-11 NOTE — Progress Notes (Signed)
D/c tele and IV. Went over AVS with pt and daughter and all questions were addressed.   Lavenia Atlas, RN

## 2021-12-11 NOTE — Evaluation (Signed)
Occupational Therapy Evaluation Patient Details Name: Nathan [name] MRN: [id number] DOB: Oct 13, 1945 Today's Date: 12/11/2021   History of Present Illness Pt is a 76 y/o male admitted secondary to LLE pain on 6/10. 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 PMH includes prostate cancer, DM, HTN, a fib, and s/p suprapubic catheter.   Clinical Impression   PTA patient independent with ADLs, mobility but having assist from family for IADLs.  Admitted for above and presents with problem list below, including L LE pain and edema, decreased activity tolerance and impaired balance.  Using RW, pt able to complete mobility and transfers with supervision, ADLs with supervision.  He will benefit from further OT services acutely to optimize return to independence with ADLs, but anticipate no further needs after dc home.    Recommendations for follow up therapy are one component of a multi-disciplinary discharge planning process, led by the attending physician.  Recommendations may be updated based on patient status, additional functional criteria and insurance authorization.   Follow Up Recommendations  No OT follow up    Assistance Recommended at Discharge Set up Supervision/Assistance  Patient can return home with the following A little help with walking and/or transfers;A little help with bathing/dressing/bathroom;Assist for transportation;Assistance with cooking/housework    Functional Status Assessment  Patient has had a recent decline in their functional status and demonstrates the ability to make significant improvements in function in a reasonable and predictable amount of time.  Equipment Recommendations  None recommended by OT    Recommendations for Other Services       Precautions / Restrictions Precautions Precautions: Fall Precaution Comments: suprapubic catheter Restrictions Weight Bearing Restrictions: No      Mobility Bed Mobility Overal bed mobility:  Modified Independent                  Transfers Overall transfer level: Needs assistance Equipment used: Rolling walker (2 wheels) Transfers: Sit to/from Stand Sit to Stand: Supervision           General transfer comment: Supervision for safety. Cues for hand placement      Balance Overall balance assessment: Mild deficits observed, not formally tested                                         ADL either performed or assessed with clinical judgement   ADL Overall ADL's : Needs assistance/impaired     Grooming: Supervision/safety;Standing           Upper Body Dressing : Set up;Sitting   Lower Body Dressing: Supervision/safety;Sit to/from stand   Toilet Transfer: Supervision/safety;Ambulation;Rolling walker (2 wheels)   Toileting- Clothing Manipulation and Hygiene: Supervision/safety;Sit to/from stand       Functional mobility during ADLs: Supervision/safety;Rolling walker (2 wheels)       Vision   Vision Assessment?: No apparent visual deficits     Perception     Praxis      Pertinent Vitals/Pain Pain Assessment Pain Assessment: Faces Faces Pain Scale: Hurts little more Pain Location: LLE Pain Descriptors / Indicators: Grimacing, Guarding Pain Intervention(s): Monitored during session, Limited activity within patient's tolerance, Repositioned     Hand Dominance Right   Extremity/Trunk Assessment Upper Extremity Assessment Upper Extremity Assessment: Overall WFL for tasks assessed   Lower Extremity Assessment Lower Extremity Assessment: Defer to PT evaluation LLE Deficits / Details: LLE swelling, but pt  reports it has improved.   Cervical / Trunk Assessment Cervical / Trunk Assessment: Normal   Communication Communication Communication: No difficulties   Cognition Arousal/Alertness: Awake/alert Behavior During Therapy: WFL for tasks assessed/performed Overall Cognitive Status: Within Functional Limits for tasks  assessed                                       General Comments       Exercises     Shoulder Instructions      Home Living Family/patient expects to be discharged to:: Private residence Living Arrangements: Children;Spouse/significant other (daughter) Available Help at Discharge: Family;Available 24 hours/day Type of Home: House Home Access: Stairs to enter [employer] of Steps: 2 Entrance Stairs-Rails: Right Home Layout: Two level;Bed/bath upstairs Alternate Level Stairs-Number of Steps: 14 Alternate Level Stairs-Rails: Right Bathroom Shower/Tub: Teacher, early years/pre: Standard     Home Equipment: Cane - single point;Grab bars - tub/shower;Grab bars - toilet;Shower seat          Prior Functioning/Environment Prior Level of Function : Independent/Modified Independent               ADLs Comments: family assists with IADLS        OT Problem List: Decreased activity tolerance;Impaired balance (sitting and/or standing);Increased edema;Decreased knowledge of use of DME or AE      OT Treatment/Interventions: Self-care/ADL training;Therapeutic exercise;DME and/or AE instruction;Therapeutic activities;Patient/family education;Balance training    OT Goals(Current goals can be found in the care plan section) Acute Rehab OT Goals Patient Stated Goal: home OT Goal Formulation: With patient Time For Goal Achievement: 12/25/21 Potential to Achieve Goals: Good  OT Frequency: Min 2X/week    Co-evaluation              AM-PAC OT "6 Clicks" Daily Activity     Outcome Measure Help from another person eating meals?: None Help from another person taking care of personal grooming?: A Little Help from another person toileting, which includes using toliet, bedpan, or urinal?: A Little Help from another person bathing (including washing, rinsing, drying)?: A Little Help from another person to put on and taking off regular upper body  clothing?: A Little Help from another person to put on and taking off regular lower body clothing?: A Little 6 Click Score: 19   End of Session Equipment Utilized During Treatment: Rolling walker (2 wheels);Gait belt Nurse Communication: Mobility status  Activity Tolerance: Patient tolerated treatment well Patient left: in chair;with call bell/phone within reach;with chair alarm set  OT Visit Diagnosis: Other abnormalities of gait and mobility (R26.89);Muscle weakness (generalized) (M62.81)                Time: 7035-0093 OT Time Calculation (min): 9 min Charges:  OT General Charges $OT Visit: 1 Visit OT Evaluation $OT Eval Moderate Complexity: 1 Mod  Jolaine Artist, OT Acute Rehabilitation Services Office [phone]   Delight Stare 12/11/2021, 11:30 AM

## 2021-12-11 NOTE — Discharge Instructions (Addendum)
Nathan [name],  You were in the hospital with a blood clot in your leg. Usually this is managed with blood thinners, but because of your bloody urine (hematuria), you had an IVC filter placed. Please follow-up with your PCP and urologist. Also, follow-up with the vascular surgeon (as needed).

## 2021-12-14 LAB — CULTURE, BLOOD (ROUTINE X 2)
Culture: NO GROWTH
Culture: NO GROWTH

## 2022-01-03 ENCOUNTER — Encounter: Payer: Medicare Other | Admitting: Vascular Surgery

## 2022-01-11 ENCOUNTER — Inpatient Hospital Stay (HOSPITAL_COMMUNITY)
Admission: EM | Admit: 2022-01-11 | Discharge: 2022-01-28 | DRG: 713 | Disposition: A | Discharge status: Home Health | Payer: Medicare Other | Attending: Internal Medicine | Admitting: Internal Medicine

## 2022-01-11 ENCOUNTER — Other Ambulatory Visit: Payer: Self-pay

## 2022-01-11 ENCOUNTER — Encounter (HOSPITAL_COMMUNITY): Payer: Self-pay | Admitting: Emergency Medicine

## 2022-01-11 DIAGNOSIS — Z9359 Other cystostomy status: Secondary | ICD-10-CM

## 2022-01-11 DIAGNOSIS — Z87891 Personal history of nicotine dependence: Secondary | ICD-10-CM

## 2022-01-11 DIAGNOSIS — I480 Paroxysmal atrial fibrillation: Secondary | ICD-10-CM | POA: Diagnosis present

## 2022-01-11 DIAGNOSIS — Z79899 Other long term (current) drug therapy: Secondary | ICD-10-CM

## 2022-01-11 DIAGNOSIS — Z9841 Cataract extraction status, right eye: Secondary | ICD-10-CM

## 2022-01-11 DIAGNOSIS — D649 Anemia, unspecified: Principal | ICD-10-CM

## 2022-01-11 DIAGNOSIS — E875 Hyperkalemia: Secondary | ICD-10-CM | POA: Diagnosis present

## 2022-01-11 DIAGNOSIS — I82443 Acute embolism and thrombosis of tibial vein, bilateral: Secondary | ICD-10-CM | POA: Diagnosis present

## 2022-01-11 DIAGNOSIS — I82433 Acute embolism and thrombosis of popliteal vein, bilateral: Secondary | ICD-10-CM | POA: Diagnosis present

## 2022-01-11 DIAGNOSIS — I82463 Acute embolism and thrombosis of calf muscular vein, bilateral: Secondary | ICD-10-CM | POA: Diagnosis present

## 2022-01-11 DIAGNOSIS — I456 Pre-excitation syndrome: Secondary | ICD-10-CM | POA: Diagnosis present

## 2022-01-11 DIAGNOSIS — E119 Type 2 diabetes mellitus without complications: Secondary | ICD-10-CM | POA: Diagnosis not present

## 2022-01-11 DIAGNOSIS — R338 Other retention of urine: Secondary | ICD-10-CM | POA: Diagnosis present

## 2022-01-11 DIAGNOSIS — Z87440 Personal history of urinary (tract) infections: Secondary | ICD-10-CM

## 2022-01-11 DIAGNOSIS — I4891 Unspecified atrial fibrillation: Secondary | ICD-10-CM | POA: Diagnosis not present

## 2022-01-11 DIAGNOSIS — Z86718 Personal history of other venous thrombosis and embolism: Secondary | ICD-10-CM

## 2022-01-11 DIAGNOSIS — I10 Essential (primary) hypertension: Secondary | ICD-10-CM

## 2022-01-11 DIAGNOSIS — Z8701 Personal history of pneumonia (recurrent): Secondary | ICD-10-CM

## 2022-01-11 DIAGNOSIS — Y842 Radiological procedure and radiotherapy as the cause of abnormal reaction of the patient, or of later complication, without mention of misadventure at the time of the procedure: Secondary | ICD-10-CM | POA: Diagnosis present

## 2022-01-11 DIAGNOSIS — R000 Tachycardia, unspecified: Secondary | ICD-10-CM | POA: Diagnosis present

## 2022-01-11 DIAGNOSIS — Z9842 Cataract extraction status, left eye: Secondary | ICD-10-CM

## 2022-01-11 DIAGNOSIS — C61 Malignant neoplasm of prostate: Principal | ICD-10-CM

## 2022-01-11 DIAGNOSIS — R310 Gross hematuria: Secondary | ICD-10-CM | POA: Diagnosis not present

## 2022-01-11 DIAGNOSIS — Z95828 Presence of other vascular implants and grafts: Secondary | ICD-10-CM

## 2022-01-11 DIAGNOSIS — N210 Calculus in bladder: Secondary | ICD-10-CM | POA: Diagnosis present

## 2022-01-11 DIAGNOSIS — I482 Chronic atrial fibrillation, unspecified: Secondary | ICD-10-CM | POA: Diagnosis present

## 2022-01-11 DIAGNOSIS — N401 Enlarged prostate with lower urinary tract symptoms: Secondary | ICD-10-CM | POA: Diagnosis present

## 2022-01-11 DIAGNOSIS — D62 Acute posthemorrhagic anemia: Secondary | ICD-10-CM | POA: Diagnosis present

## 2022-01-11 DIAGNOSIS — I82413 Acute embolism and thrombosis of femoral vein, bilateral: Secondary | ICD-10-CM | POA: Diagnosis present

## 2022-01-11 DIAGNOSIS — N179 Acute kidney failure, unspecified: Secondary | ICD-10-CM | POA: Diagnosis present

## 2022-01-11 DIAGNOSIS — R319 Hematuria, unspecified: Secondary | ICD-10-CM | POA: Diagnosis present

## 2022-01-11 DIAGNOSIS — Z7901 Long term (current) use of anticoagulants: Secondary | ICD-10-CM

## 2022-01-11 DIAGNOSIS — R509 Fever, unspecified: Secondary | ICD-10-CM | POA: Diagnosis not present

## 2022-01-11 LAB — CBC WITH DIFFERENTIAL/PLATELET
Abs Immature Granulocytes: 0.02 10*3/uL (ref 0.00–0.07)
Basophils Absolute: 0 10*3/uL (ref 0.0–0.1)
Basophils Relative: 0 %
Eosinophils Absolute: 0.2 10*3/uL (ref 0.0–0.5)
Eosinophils Relative: 3 %
HCT: 17.1 % — ABNORMAL LOW (ref 39.0–52.0)
Hemoglobin: 4.9 g/dL — CL (ref 13.0–17.0)
Immature Granulocytes: 0 %
Lymphocytes Relative: 16 %
Lymphs Abs: 0.8 10*3/uL (ref 0.7–4.0)
MCH: 25.9 pg — ABNORMAL LOW (ref 26.0–34.0)
MCHC: 28.7 g/dL — ABNORMAL LOW (ref 30.0–36.0)
MCV: 90.5 fL (ref 80.0–100.0)
Monocytes Absolute: 0.7 10*3/uL (ref 0.1–1.0)
Monocytes Relative: 14 %
Neutro Abs: 3.5 10*3/uL (ref 1.7–7.7)
Neutrophils Relative %: 67 %
Platelets: 215 10*3/uL (ref 150–400)
RBC: 1.89 MIL/uL — ABNORMAL LOW (ref 4.22–5.81)
RDW: 17.3 % — ABNORMAL HIGH (ref 11.5–15.5)
WBC: 5.3 10*3/uL (ref 4.0–10.5)
nRBC: 0.8 % — ABNORMAL HIGH (ref 0.0–0.2)

## 2022-01-11 LAB — URINALYSIS, ROUTINE W REFLEX MICROSCOPIC: RBC / HPF: >50 RBC/hpf — ABNORMAL HIGH (ref 0–5)

## 2022-01-11 LAB — BASIC METABOLIC PANEL
Anion gap: 4 — ABNORMAL LOW (ref 5–15)
BUN: 12 mg/dL (ref 8–23)
CO2: 27 mmol/L (ref 22–32)
Calcium: 8.6 mg/dL — ABNORMAL LOW (ref 8.9–10.3)
Chloride: 111 mmol/L (ref 98–111)
Creatinine, Ser: 1.04 mg/dL (ref 0.61–1.24)
GFR, Estimated: >60 mL/min (ref >60)
Glucose, Bld: 125 mg/dL — ABNORMAL HIGH (ref 70–99)
Potassium: 4.2 mmol/L (ref 3.5–5.1)
Sodium: 142 mmol/L (ref 135–145)

## 2022-01-11 LAB — ABO/RH: ABO/RH(D): B POS

## 2022-01-11 LAB — PREPARE RBC (CROSSMATCH)

## 2022-01-11 MED ORDER — SODIUM CHLORIDE 0.9 % IV SOLN
10.0000 mL/h | Freq: Once | INTRAVENOUS | Status: AC
  Administered 2022-01-11: 10 mL/h via INTRAVENOUS

## 2022-01-11 MED ORDER — SODIUM CHLORIDE 0.9 % IV BOLUS
1000.0000 mL | Freq: Once | INTRAVENOUS | Status: AC
  Administered 2022-01-11: 1000 mL via INTRAVENOUS

## 2022-01-11 NOTE — ED Provider Notes (Signed)
[facility] DEPT Provider Note   CSN: [id number] Arrival date & time: 01/11/22  1901     History  Chief Complaint  Patient presents with   Abnormal Lab    Nathan [name] is a 76 y.o. male.  Pt is a 76 yo male with a pmhx significant for htn, afib (not on thinners due to hematuria), bph, dm, prostate cancer, DVT s/p IVC filter placement and suprapubic catheter with chronic hematuria.  Pt has had increased weakness and fatigue for the past few days.  Pt has had more bleeding from his catheter for the last month.  Pt's daughter took pt to his pcp who did a cbc.  Hgb 6, so he was told to come to the ED.  Pt denies any pain.  No fevers.       Home Medications Prior to Admission medications   Medication Sig Start Date End Date Taking? Authorizing Provider  Ergocalciferol (VITAMIN D2 PO) Take 50,000 Units by mouth daily.    [provider]  finasteride (PROSCAR) 5 MG tablet Take 5 mg by mouth daily.    [provider]  folic acid (FOLVITE) 1 MG tablet Take 1 mg by mouth daily. 03/24/19   [provider]  gabapentin (NEURONTIN) 300 MG capsule Take 300 mg by mouth 3 (three) times daily. 10/27/21   [provider]  lisinopril (ZESTRIL) 40 MG tablet Take 40 mg by mouth daily. 09/24/20   [provider]  magnesium oxide (MAG-OX) 400 MG tablet Take 400 mg by mouth daily.  03/24/19   [provider]  metoprolol succinate (TOPROL-XL) 100 MG 24 hr tablet Take 100 mg by mouth daily. 11/14/21   [provider]  Prenatal Vit-Fe Fumarate-FA (PRENATAL MULTIVITAMIN) TABS tablet Take 1 tablet by mouth daily at 12 noon.    [provider]  vitamin B-12 (CYANOCOBALAMIN) 1000 MCG tablet Take 1,000 mcg by mouth daily.    [provider]      Allergies    Patient has no known allergies.    Review of Systems   Review of Systems  Genitourinary:  Positive for hematuria.  All other systems reviewed  and are negative.   Physical Exam Updated Vital Signs BP 114/75   Pulse 84   Temp 98.8 F (37.1 C) (Oral)   Resp 13   Ht '6\' 1"'$  (1.854 m)   Wt 88.5 kg   SpO2 100%   BMI 25.73 kg/m  Physical Exam Vitals and nursing note reviewed.  Constitutional:      Appearance: Normal appearance.  HENT:     Head: Normocephalic and atraumatic.     Right Ear: External ear normal.     Left Ear: External ear normal.     Nose: Nose normal.     Mouth/Throat:     Mouth: Mucous membranes are moist.     Pharynx: Oropharynx is clear.  Eyes:     Extraocular Movements: Extraocular movements intact.     Pupils: Pupils are equal, round, and reactive to light.     Comments: Conjunctiva pale  Cardiovascular:     Rate and Rhythm: Regular rhythm. Tachycardia present.     Pulses: Normal pulses.     Heart sounds: Normal heart sounds.  Pulmonary:     Effort: Pulmonary effort is normal.     Breath sounds: Normal breath sounds.  Abdominal:     General: Abdomen is flat. Bowel sounds are normal.     Palpations: Abdomen is soft.  Comments: Suprapubic catheter with gross hematuria  Musculoskeletal:        General: Normal range of motion.     Cervical back: Normal range of motion and neck supple.     Left lower leg: Edema present.  Skin:    General: Skin is warm.     Capillary Refill: Capillary refill takes less than 2 seconds.  Neurological:     General: No focal deficit present.     Mental Status: He is alert and oriented to person, place, and time.  Psychiatric:        Mood and Affect: Mood normal.        Behavior: Behavior normal.     ED Results / Procedures / Treatments   Labs (all labs ordered are listed, but only abnormal results are displayed) Labs Reviewed  CBC WITH DIFFERENTIAL/PLATELET - Abnormal; Notable for the following components:      Result Value   RBC 1.89 (*)    Hemoglobin 4.9 (*)    HCT 17.1 (*)    MCH 25.9 (*)    MCHC 28.7 (*)    RDW 17.3 (*)    nRBC 0.8 (*)    All  other components within normal limits  BASIC METABOLIC PANEL - Abnormal; Notable for the following components:   Glucose, Bld 125 (*)    Calcium 8.6 (*)    Anion gap 4 (*)    All other components within normal limits  URINALYSIS, ROUTINE W REFLEX MICROSCOPIC - Abnormal; Notable for the following components:   Color, Urine RED (*)    APPearance TURBID (*)    Glucose, UA   (*)    Value: TEST NOT REPORTED DUE TO COLOR INTERFERENCE OF URINE PIGMENT   Hgb urine dipstick   (*)    Value: TEST NOT REPORTED DUE TO COLOR INTERFERENCE OF URINE PIGMENT   Bilirubin Urine   (*)    Value: TEST NOT REPORTED DUE TO COLOR INTERFERENCE OF URINE PIGMENT   Ketones, ur   (*)    Value: TEST NOT REPORTED DUE TO COLOR INTERFERENCE OF URINE PIGMENT   Protein, ur   (*)    Value: TEST NOT REPORTED DUE TO COLOR INTERFERENCE OF URINE PIGMENT   Nitrite   (*)    Value: TEST NOT REPORTED DUE TO COLOR INTERFERENCE OF URINE PIGMENT   Leukocytes,Ua   (*)    Value: TEST NOT REPORTED DUE TO COLOR INTERFERENCE OF URINE PIGMENT   RBC / HPF >50 (*)    Bacteria, UA RARE (*)    All other components within normal limits  URINE CULTURE  TYPE AND SCREEN  PREPARE RBC (CROSSMATCH)    EKG None  Radiology No results found.  Procedures Procedures    Medications Ordered in ED Medications  0.9 %  sodium chloride infusion (has no administration in time range)  sodium chloride 0.9 % bolus 1,000 mL (1,000 mLs Intravenous New Bag/Given 01/11/22 1958)    ED Course/ Medical Decision Making/ A&P                           Medical Decision Making Amount and/or Complexity of Data Reviewed Labs: ordered.  Risk Prescription drug management. Decision regarding hospitalization.   This patient presents to the ED for concern of weakness, this involves an extensive number of treatment options, and is a complaint that carries with it a high risk of complications and morbidity.  The differential diagnosis includes anemia,  electrolyte abn, infection   Co morbidities that complicate the patient evaluation  htn, afib (not on thinners due to hematuria), bph, dm, prostate cancer, DVT s/p IVC filter placement and suprapubic catheter with chronic hematuria   Additional history obtained:  Additional history obtained from epic chart review External records from outside source obtained and reviewed including daughter   Lab Tests:  I Ordered, and personally interpreted labs.  The pertinent results include:  bmp nl, ua grossly bloody, hemoglobin 4.9   Cardiac Monitoring:  The patient was maintained on a cardiac monitor.  I personally viewed and interpreted the cardiac monitored which showed an underlying rhythm of: nsr   Medicines ordered and prescription drug management:  I ordered medication including blood  for symptomatic anemia  Reevaluation of the patient after these medicines showed that the patient improved I have reviewed the patients home medicines and have made adjustments as needed    Consultations Obtained:  I requested consultation with the urologist Hinton Rao),  and discussed lab and imaging findings as well as pertinent plan - they recommend: will see pt in consult in the morning.  They recommend treating for UTI now and sending urine for culture. Pt d/w Dr. Flossie Buffy (triad) for admission   Problem List / ED Course:  Gross hematuria with symptomatic anemia:  hgb down to 4.9 today.  2 units prbcs to be given to pt.  Pt given iv rocephin in ED.  Urine sent for cx.   Reevaluation:  After the interventions noted above, I reevaluated the patient and found that they have :improved   Social Determinants of Health:  Lives at home   Dispostion:  After consideration of the diagnostic results and the patients response to treatment, I feel that the patent would benefit from admission.   CRITICAL CARE Performed by: Isla Pence   Total critical care time: 30 minutes  Critical care  time was exclusive of separately billable procedures and treating other patients.  Critical care was necessary to treat or prevent imminent or life-threatening deterioration.  Critical care was time spent personally by me on the following activities: development of treatment plan with patient and/or surrogate as well as nursing, discussions with consultants, evaluation of patient's response to treatment, examination of patient, obtaining history from patient or surrogate, ordering and performing treatments and interventions, ordering and review of laboratory studies, ordering and review of radiographic studies, pulse oximetry and re-evaluation of patient's condition.          Final Clinical Impression(s) / ED Diagnoses Final diagnoses:  Symptomatic anemia  Gross hematuria  Suprapubic catheter [facility])    Rx / DC Orders ED Discharge Orders     None         Isla Pence, MD 01/11/22 2121

## 2022-01-11 NOTE — ED Provider Triage Note (Signed)
Emergency Medicine Provider Triage Evaluation Note  Niyam Bisping , a 76 y.o. male  was evaluated in triage.  Pt complains of abnormal lab. Chronic bleeding from suprapubic cath x 1 month. Increased weakness over last few days. PCP said hgb went from 11>>6. No bloody stool. No thinners. Did have recent IVC filter placed for LE DVT. Hx of prostate CA, not undergoing treatment.  Review of Systems  Positive: Abnormal lab, hematuria Negative:   Physical Exam  There were no vitals taken for this visit. Gen:   Awake, no distress   Resp:  Normal effort  MSK:   Moves extremities without difficulty  Other:    Medical Decision Making  Medically screening exam initiated at 7:06 PM.  Appropriate orders placed.  Ronen Bromwell was informed that the remainder of the evaluation will be completed by another provider, this initial triage assessment does not replace that evaluation, and the importance of remaining in the ED until their evaluation is complete.    Abnormal lab, hematuria   Amanie Mcculley A, PA-C 01/11/22 1909

## 2022-01-11 NOTE — ED Triage Notes (Signed)
Patient brought in by daughter c/o blood coming out of his suprapubic catheter since it being replaced last month. Daughter states patient has continually become weaker. Hemoglobin checked by PCP showing 6. Daughter adds catheter was changed today by home nurse.

## 2022-01-12 DIAGNOSIS — Y842 Radiological procedure and radiotherapy as the cause of abnormal reaction of the patient, or of later complication, without mention of misadventure at the time of the procedure: Secondary | ICD-10-CM | POA: Diagnosis present

## 2022-01-12 DIAGNOSIS — I82433 Acute embolism and thrombosis of popliteal vein, bilateral: Secondary | ICD-10-CM | POA: Diagnosis present

## 2022-01-12 DIAGNOSIS — Z86718 Personal history of other venous thrombosis and embolism: Secondary | ICD-10-CM | POA: Diagnosis not present

## 2022-01-12 DIAGNOSIS — R310 Gross hematuria: Secondary | ICD-10-CM | POA: Diagnosis present

## 2022-01-12 DIAGNOSIS — Z8701 Personal history of pneumonia (recurrent): Secondary | ICD-10-CM | POA: Diagnosis not present

## 2022-01-12 DIAGNOSIS — Z9841 Cataract extraction status, right eye: Secondary | ICD-10-CM | POA: Diagnosis not present

## 2022-01-12 DIAGNOSIS — Z9359 Other cystostomy status: Secondary | ICD-10-CM | POA: Diagnosis present

## 2022-01-12 DIAGNOSIS — D649 Anemia, unspecified: Secondary | ICD-10-CM | POA: Diagnosis not present

## 2022-01-12 DIAGNOSIS — I4891 Unspecified atrial fibrillation: Secondary | ICD-10-CM | POA: Diagnosis not present

## 2022-01-12 DIAGNOSIS — I456 Pre-excitation syndrome: Secondary | ICD-10-CM | POA: Diagnosis present

## 2022-01-12 DIAGNOSIS — Z95828 Presence of other vascular implants and grafts: Secondary | ICD-10-CM | POA: Diagnosis not present

## 2022-01-12 DIAGNOSIS — D62 Acute posthemorrhagic anemia: Secondary | ICD-10-CM | POA: Diagnosis present

## 2022-01-12 DIAGNOSIS — R509 Fever, unspecified: Secondary | ICD-10-CM | POA: Diagnosis not present

## 2022-01-12 DIAGNOSIS — N401 Enlarged prostate with lower urinary tract symptoms: Secondary | ICD-10-CM | POA: Diagnosis present

## 2022-01-12 DIAGNOSIS — E119 Type 2 diabetes mellitus without complications: Secondary | ICD-10-CM | POA: Diagnosis present

## 2022-01-12 DIAGNOSIS — N210 Calculus in bladder: Secondary | ICD-10-CM | POA: Diagnosis present

## 2022-01-12 DIAGNOSIS — I10 Essential (primary) hypertension: Secondary | ICD-10-CM | POA: Diagnosis present

## 2022-01-12 DIAGNOSIS — I482 Chronic atrial fibrillation, unspecified: Secondary | ICD-10-CM | POA: Diagnosis present

## 2022-01-12 DIAGNOSIS — I82443 Acute embolism and thrombosis of tibial vein, bilateral: Secondary | ICD-10-CM | POA: Diagnosis present

## 2022-01-12 DIAGNOSIS — I82409 Acute embolism and thrombosis of unspecified deep veins of unspecified lower extremity: Secondary | ICD-10-CM | POA: Diagnosis not present

## 2022-01-12 DIAGNOSIS — I82463 Acute embolism and thrombosis of calf muscular vein, bilateral: Secondary | ICD-10-CM | POA: Diagnosis present

## 2022-01-12 DIAGNOSIS — Z87891 Personal history of nicotine dependence: Secondary | ICD-10-CM | POA: Diagnosis not present

## 2022-01-12 DIAGNOSIS — I480 Paroxysmal atrial fibrillation: Secondary | ICD-10-CM | POA: Diagnosis present

## 2022-01-12 DIAGNOSIS — N179 Acute kidney failure, unspecified: Secondary | ICD-10-CM | POA: Diagnosis present

## 2022-01-12 DIAGNOSIS — R338 Other retention of urine: Secondary | ICD-10-CM | POA: Diagnosis present

## 2022-01-12 DIAGNOSIS — Z9842 Cataract extraction status, left eye: Secondary | ICD-10-CM | POA: Diagnosis not present

## 2022-01-12 DIAGNOSIS — R319 Hematuria, unspecified: Secondary | ICD-10-CM | POA: Diagnosis not present

## 2022-01-12 DIAGNOSIS — I82413 Acute embolism and thrombosis of femoral vein, bilateral: Secondary | ICD-10-CM | POA: Diagnosis present

## 2022-01-12 DIAGNOSIS — C61 Malignant neoplasm of prostate: Secondary | ICD-10-CM | POA: Diagnosis present

## 2022-01-12 DIAGNOSIS — E875 Hyperkalemia: Secondary | ICD-10-CM | POA: Diagnosis present

## 2022-01-12 DIAGNOSIS — R000 Tachycardia, unspecified: Secondary | ICD-10-CM | POA: Diagnosis present

## 2022-01-12 LAB — HEMOGLOBIN AND HEMATOCRIT, BLOOD
HCT: 24.6 % — ABNORMAL LOW (ref 39.0–52.0)
Hemoglobin: 7.5 g/dL — ABNORMAL LOW (ref 13.0–17.0)

## 2022-01-12 LAB — CBC
HCT: 21.2 % — ABNORMAL LOW (ref 39.0–52.0)
HCT: 25.3 % — ABNORMAL LOW (ref 39.0–52.0)
Hemoglobin: 6.3 g/dL — CL (ref 13.0–17.0)
Hemoglobin: 7.6 g/dL — ABNORMAL LOW (ref 13.0–17.0)
MCH: 24.9 pg — ABNORMAL LOW (ref 26.0–34.0)
MCH: 24.5 pg — ABNORMAL LOW (ref 26.0–34.0)
MCHC: 29.7 g/dL — ABNORMAL LOW (ref 30.0–36.0)
MCHC: 30 g/dL (ref 30.0–36.0)
MCV: 83.8 fL (ref 80.0–100.0)
MCV: 81.6 fL (ref 80.0–100.0)
Platelets: 207 10*3/uL (ref 150–400)
Platelets: 217 10*3/uL (ref 150–400)
RBC: 2.53 MIL/uL — ABNORMAL LOW (ref 4.22–5.81)
RBC: 3.1 MIL/uL — ABNORMAL LOW (ref 4.22–5.81)
RDW: 20.4 % — ABNORMAL HIGH (ref 11.5–15.5)
RDW: 21.7 % — ABNORMAL HIGH (ref 11.5–15.5)
WBC: 6.1 10*3/uL (ref 4.0–10.5)
WBC: 9.4 10*3/uL (ref 4.0–10.5)
nRBC: 0.3 % — ABNORMAL HIGH (ref 0.0–0.2)
nRBC: 0.7 % — ABNORMAL HIGH (ref 0.0–0.2)

## 2022-01-12 LAB — GLUCOSE, CAPILLARY
Glucose-Capillary: 96 mg/dL (ref 70–99)
Glucose-Capillary: 97 mg/dL (ref 70–99)
Glucose-Capillary: 109 mg/dL — ABNORMAL HIGH (ref 70–99)
Glucose-Capillary: 116 mg/dL — ABNORMAL HIGH (ref 70–99)

## 2022-01-12 LAB — PREPARE RBC (CROSSMATCH)

## 2022-01-12 MED ORDER — LISINOPRIL 20 MG PO TABS
40.0000 mg | ORAL_TABLET | Freq: Every day | ORAL | Status: DC
  Administered 2022-01-12 – 2022-01-22 (×10): 40 mg via ORAL
  Filled 2022-01-12 (×4): qty 2
  Filled 2022-01-12 (×2): qty 4
  Filled 2022-01-12 (×2): qty 2
  Filled 2022-01-12 (×2): qty 4

## 2022-01-12 MED ORDER — METOPROLOL SUCCINATE ER 100 MG PO TB24
100.0000 mg | ORAL_TABLET | Freq: Every day | ORAL | Status: DC
  Administered 2022-01-12 – 2022-01-28 (×16): 100 mg via ORAL
  Filled 2022-01-12 (×4): qty 1
  Filled 2022-01-12: qty 4
  Filled 2022-01-12: qty 1
  Filled 2022-01-12: qty 2
  Filled 2022-01-12: qty 4
  Filled 2022-01-12: qty 2
  Filled 2022-01-12: qty 4
  Filled 2022-01-12 (×2): qty 1
  Filled 2022-01-12: qty 4
  Filled 2022-01-12: qty 2
  Filled 2022-01-12 (×3): qty 1

## 2022-01-12 MED ORDER — INSULIN ASPART 100 UNIT/ML IJ SOLN
4.0000 [IU] | Freq: Three times a day (TID) | INTRAMUSCULAR | Status: DC
  Administered 2022-01-18 – 2022-01-28 (×20): 4 [IU] via SUBCUTANEOUS

## 2022-01-12 MED ORDER — OXYCODONE HCL 5 MG PO TABS
5.0000 mg | ORAL_TABLET | Freq: Four times a day (QID) | ORAL | Status: DC | PRN
  Administered 2022-01-12 – 2022-01-21 (×7): 5 mg via ORAL
  Filled 2022-01-12 (×7): qty 1

## 2022-01-12 MED ORDER — SODIUM CHLORIDE 0.9% IV SOLUTION: Freq: Once | INTRAVENOUS | Status: AC

## 2022-01-12 MED ORDER — INSULIN ASPART 100 UNIT/ML IJ SOLN: 0.0000 [IU] | INTRAMUSCULAR | Status: DC

## 2022-01-12 MED ORDER — CHLORHEXIDINE GLUCONATE CLOTH 2 % EX PADS
6.0000 | MEDICATED_PAD | Freq: Every day | CUTANEOUS | Status: DC
Start: 2022-01-12 — End: 2022-01-28
  Administered 2022-01-13 – 2022-01-28 (×15): 6 via TOPICAL

## 2022-01-12 MED ORDER — GABAPENTIN 300 MG PO CAPS
300.0000 mg | ORAL_CAPSULE | Freq: Three times a day (TID) | ORAL | Status: DC
  Administered 2022-01-12 – 2022-01-23 (×34): 300 mg via ORAL
  Filled 2022-01-12 (×34): qty 1

## 2022-01-12 MED ORDER — INSULIN ASPART 100 UNIT/ML IJ SOLN
0.0000 [IU] | Freq: Three times a day (TID) | INTRAMUSCULAR | Status: DC
  Administered 2022-01-16 – 2022-01-23 (×7): 2 [IU] via SUBCUTANEOUS
  Administered 2022-01-25: 3 [IU] via SUBCUTANEOUS
  Administered 2022-01-26 – 2022-01-27 (×2): 2 [IU] via SUBCUTANEOUS
  Administered 2022-01-28: 3 [IU] via SUBCUTANEOUS
  Administered 2022-01-28: 2 [IU] via SUBCUTANEOUS

## 2022-01-12 MED ORDER — SODIUM CHLORIDE 0.9 % IV SOLN
1.0000 g | INTRAVENOUS | Status: DC
  Administered 2022-01-12 (×2): 1 g via INTRAVENOUS
  Filled 2022-01-12 (×2): qty 10

## 2022-01-12 MED ORDER — VITAMIN B-12 1000 MCG PO TABS
1000.0000 ug | ORAL_TABLET | Freq: Every day | ORAL | Status: DC
  Administered 2022-01-12 – 2022-01-28 (×16): 1000 ug via ORAL
  Filled 2022-01-12 (×17): qty 1

## 2022-01-12 MED ORDER — FINASTERIDE 5 MG PO TABS
5.0000 mg | ORAL_TABLET | Freq: Every day | ORAL | Status: DC
  Administered 2022-01-12 – 2022-01-28 (×16): 5 mg via ORAL
  Filled 2022-01-12 (×16): qty 1

## 2022-01-12 MED ORDER — MAGNESIUM OXIDE -MG SUPPLEMENT 400 (240 MG) MG PO TABS
400.0000 mg | ORAL_TABLET | Freq: Every day | ORAL | Status: DC
  Administered 2022-01-12 – 2022-01-28 (×16): 400 mg via ORAL
  Filled 2022-01-12 (×16): qty 1

## 2022-01-12 MED ORDER — FOLIC ACID 1 MG PO TABS
1.0000 mg | ORAL_TABLET | Freq: Every day | ORAL | Status: DC
  Administered 2022-01-12 – 2022-01-28 (×16): 1 mg via ORAL
  Filled 2022-01-12 (×16): qty 1

## 2022-01-12 MED ORDER — INSULIN ASPART 100 UNIT/ML IJ SOLN
0.0000 [IU] | Freq: Every day | INTRAMUSCULAR | Status: DC
  Administered 2022-01-19 – 2022-01-20 (×2): 2 [IU] via SUBCUTANEOUS

## 2022-01-12 NOTE — Progress Notes (Addendum)
Patient ID: Nathan [name], male   DOB: [birth year], 76 y.o.   MRN: [id number] Request received to eval pt for possible prostate artery embolization. Case d/w Dr. Kathlene Cote. He recommends CT angio A/P prior to completing assessment for PAE. Dr. Junious Silk notified. Will follow.

## 2022-01-12 NOTE — Consult Note (Signed)
Urology Consult Note   Requesting Attending Physician:  Charlynne Cousins, MD Service Providing Consult: Urology  Consulting Attending: Dr. Junious Silk   Reason for Consult:  hematuria  HPI: Nathan [name] is seen in consultation for reasons noted above at the request of Aileen Fass, [name], MD for evaluation of hematuria in setting of SP tube.  This is a 76 y.o. male with history of prostate cancer s/p brachytherapy, DVT with IVC filter, afib, T2 diabetes who comes in with fatigue and worsening hematuria from SP tube, anemic to 4.9 now s/p 2 units (awaiting recheck). Catheter was flushed at bedside with small clot burden returned. Draining clear pink urine once completed. Urinalysis with significant blood which obscures interpretation/result.    Past Medical History: Past Medical History:  Diagnosis Date   Anginal pain ([place])    Atrial fibrillation ([place]) 01/2020   resolved now    Cancer of prostate ([place]) 2020   Chronic anticoagulation    Diabetes mellitus without complication ([place])    type 2 diet controlled   Dysrhythmia    a fib   Foley catheter in place    will be changed 05-16-20   HTN (hypertension)    Lipoma of chest wall    Pneumonia 01/2020   and uti in hospital for 1 week   WPW (Wolff-Parkinson-White syndrome)    No prior ablation    Past Surgical History:  Past Surgical History:  Procedure Laterality Date   CATARACT EXTRACTION Bilateral 2020   CYSTOSCOPY N/A 06/05/2021   Procedure: CYSTOSCOPY WITH SUPRA PUBIC TUBE PLACEMENT;  Surgeon: Raynelle Bring, MD;  Location: [facility];  Service: Urology;  Laterality: N/A;   LIPOMA EXCISION Left 05/16/2020   Procedure: EXCISION LIPOMA LEFT CHEST WALL;  Surgeon: Kinsinger, Arta Bruce, MD;  Location: [facility];  Service: General;  Laterality: Left;   RADIOACTIVE SEED IMPLANT  2020   VENA CAVA FILTER PLACEMENT Right 12/10/2021   Procedure: INSERTION VENA-CAVA FILTER Right Femoral;  Surgeon: Waynetta Sandy, MD;  Location: [facility];  Service: Vascular;  Laterality: Right;    Medication: Current Facility-Administered Medications  Medication Dose Route Frequency Provider Last Rate Last Admin   cefTRIAXone (ROCEPHIN) 1 g in sodium chloride 0.9 % 100 mL IVPB  1 g Intravenous Q24H Tu, Ching T, DO   Stopped at 01/12/22 0206   finasteride (PROSCAR) tablet 5 mg  5 mg Oral Daily Tu, Ching T, DO       folic acid (FOLVITE) tablet 1 mg  1 mg Oral Daily Tu, Ching T, DO       gabapentin (NEURONTIN) capsule 300 mg  300 mg Oral TID Tu, Ching T, DO       magnesium oxide (MAG-OX) tablet 400 mg  400 mg Oral Daily Tu, Ching T, DO       metoprolol succinate (TOPROL-XL) 24 hr tablet 100 mg  100 mg Oral Daily Charlynne Cousins, MD       oxyCODONE (Oxy IR/ROXICODONE) immediate release tablet 5 mg  5 mg Oral Q6H PRN Tu, Ching T, DO       vitamin B-12 (CYANOCOBALAMIN) tablet 1,000 mcg  1,000 mcg Oral Daily Tu, Ching T, DO        Allergies: No Known Allergies  Social History: Social History   Tobacco Use   Smoking status: Former    Packs/day: 0.50    Years: 60.00    Total pack years: 30.00    Types: Cigarettes    Quit date:  01/31/2019    Years since quitting: 2.9   Smokeless tobacco: Never  Vaping Use   Vaping Use: Never used  Substance Use Topics   Alcohol use: Not Currently    Comment: no alochol since 2020   Drug use: Never    Family History Family History  Problem Relation Age of Onset   Lung cancer Mother    Cervical cancer Sister    Breast cancer Neg Hx    Pancreatic cancer Neg Hx    Colon cancer Neg Hx     Review of Systems 10 systems were reviewed and are negative except as noted specifically in the HPI.  Objective   Vital signs in last 24 hours: BP (!) 153/77   Pulse 71   Temp 98.4 F (36.9 C) (Oral)   Resp 12   Ht '6\' 1"'$  (1.854 m)   Wt 88.5 kg   SpO2 100%   BMI 25.73 kg/m   Physical Exam General: NAD, A&O, resting, appropriate HEENT: [place]/AT, EOMI,  MMM Pulmonary: Normal work of breathing Cardiovascular: HDS, adequate peripheral perfusion Abdomen: Soft, NTTP, nondistended. GU: SP tube in place draining clear pink urine, CVA tenderness Extremities: warm and well perfused Neuro: Appropriate, no focal neurological deficits  Most Recent Labs: Lab Results  Component Value Date   WBC 5.3 01/11/2022   HGB 4.9 (LL) 01/11/2022   HCT 17.1 (L) 01/11/2022   PLT 215 01/11/2022    Lab Results  Component Value Date   NA 142 01/11/2022   K 4.2 01/11/2022   CL 111 01/11/2022   CO2 27 01/11/2022   BUN 12 01/11/2022   CREATININE 1.04 01/11/2022   CALCIUM 8.6 (L) 01/11/2022   MG 2.5 (H) 12/11/2021   PHOS 2.4 (L) 02/24/2020    Lab Results  Component Value Date   INR 1.9 (H) 02/23/2020   APTT 38 (H) 02/22/2020     Urine Culture: '@[id number]'$ (laburin,org,[id number],[id number])@   IMAGING: No results found.  ------  Assessment:  76 y.o. male with hematuria s/p brachytherapy and urinary retention managed with SP tube. Expect that this is a slow decline rather than acute bleed given the appearance of urine. Flushed at bedside with return of a few small clots which may be contributing. On finasteride to help with prostatic bleeding.   Recommendations: - Would recommend following urine culture and treating per complicated UTI if positive, agree with continuing ceftriaxone for now - trend H&H - will need urology outpatient follow-up for discussion of long term options.  Potentially good candidate for hyperbaric O2   Thank you for this consult. Please contact the urology consult pager with any further questions/concerns.

## 2022-01-12 NOTE — Assessment & Plan Note (Addendum)
Off Eliquis since February.  Deemed not a good candidate due to chronic hematuria.  Had discussion with cardiology regarding Watchman device back in May. Heart rate is within normal limits.

## 2022-01-12 NOTE — Assessment & Plan Note (Signed)
LLE in the Setting of prostate cancer.  Complicated by chronic hematuria.  Underwent IVC filter placement on 12/10/2021.

## 2022-01-12 NOTE — ED Notes (Addendum)
ED TO INPATIENT HANDOFF REPORT  ED Nurse Name and Phone #: Elpidio Eric [phone]  S Name/Age/Gender Nathan [name] 76 y.o. male Room/Bed: WA14/WA14  Code Status   Code Status: Full Code  Home/SNF/Other Home Patient oriented to: self, place, time, and situation Is this baseline? Yes   Triage Complete: Triage complete  Chief Complaint Symptomatic anemia [D64.9]  Triage Note Patient brought in by daughter c/o blood coming out of his suprapubic catheter since it being replaced last month. Daughter states patient has continually become weaker. Hemoglobin checked by PCP showing 6. Daughter adds catheter was changed today by home nurse.    Allergies No Known Allergies  Level of Care/Admitting Diagnosis ED Disposition     ED Disposition  Admit   Condition  --   Comment  Hospital Area: [facility] [[id number]]  Level of Care: Stepdown [14]  Admit to SDU based on following criteria: Severe physiological/psychological symptoms:  Any diagnosis requiring assessment & intervention at least every 4 hours on an ongoing basis to obtain desired patient outcomes including stability and rehabilitation  May place patient in observation at [facility] or [facility] if equivalent level of care is available:: No  Covid Evaluation: Asymptomatic - no recent exposure (last 10 days) testing not required  Diagnosis: Symptomatic anemia [7494496]  Admitting Physician: Orene Desanctis [[id number]]  Attending Physician: Orene Desanctis [[id number]]          B Medical/Surgery History Past Medical History:  Diagnosis Date   Anginal pain ([place])    Atrial fibrillation ([place]) 01/2020   resolved now    Cancer of prostate ([place]) 2020   Chronic anticoagulation    Diabetes mellitus without complication ([place])    type 2 diet controlled   Dysrhythmia    a fib   Foley catheter in place    will be changed 05-16-20   HTN (hypertension)    Lipoma of chest wall    Pneumonia 01/2020   and uti in  hospital for 1 week   WPW (Wolff-Parkinson-White syndrome)    No prior ablation   Past Surgical History:  Procedure Laterality Date   CATARACT EXTRACTION Bilateral 2020   CYSTOSCOPY N/A 06/05/2021   Procedure: CYSTOSCOPY WITH SUPRA PUBIC TUBE PLACEMENT;  Surgeon: Raynelle Bring, MD;  Location[facility];  Service: Urology;  Laterality: N/A;   LIPOMA EXCISION Left 05/16/2020   Procedure: EXCISION LIPOMA LEFT CHEST WALL;  Surgeon: Kinsinger, Arta Bruce, MD;  Location: [facility];  Service: General;  Laterality: Left;   RADIOACTIVE SEED IMPLANT  2020   VENA CAVA FILTER PLACEMENT Right 12/10/2021   Procedure: INSERTION VENA-CAVA FILTER Right Femoral;  Surgeon: Waynetta Sandy, MD;  Location: [facility];  Service: Vascular;  Laterality: Right;     A IV Location/Drains/Wounds Patient Lines/Drains/Airways Status     Active Line/Drains/Airways     Name Placement date Placement time Site Days   Peripheral IV 18 G Left Antecubital --  --  Antecubital  --   Peripheral IV 01/11/22 20 G Anterior;Right Forearm 01/11/22  1955  Forearm  1   Incision (Closed) 12/10/21 Groin Right 12/10/21  0813  -- 33            Intake/Output Last 24 hours  Intake/Output Summary (Last 24 hours) at 01/12/2022 0118 Last data filed at 01/11/2022 2327 Gross per 24 hour  Intake 278 ml  Output --  Net 278 ml    Labs/Imaging Results for orders placed or performed during the  hospital encounter of 01/11/22 (from the past 48 hour(s))  Urinalysis, Routine w reflex microscopic Urine, Catheterized     Status: Abnormal   Collection Time: 01/11/22  7:05 PM  Result Value Ref Range   Color, Urine RED (A) YELLOW   APPearance TURBID (A) CLEAR   Specific Gravity, Urine  1.005 - 1.030    TEST NOT REPORTED DUE TO COLOR INTERFERENCE OF URINE PIGMENT   pH  5.0 - 8.0    TEST NOT REPORTED DUE TO COLOR INTERFERENCE OF URINE PIGMENT   Glucose, UA (A) NEGATIVE mg/dL    TEST NOT REPORTED DUE TO COLOR  INTERFERENCE OF URINE PIGMENT   Hgb urine dipstick (A) NEGATIVE    TEST NOT REPORTED DUE TO COLOR INTERFERENCE OF URINE PIGMENT   Bilirubin Urine (A) NEGATIVE    TEST NOT REPORTED DUE TO COLOR INTERFERENCE OF URINE PIGMENT   Ketones, ur (A) NEGATIVE mg/dL    TEST NOT REPORTED DUE TO COLOR INTERFERENCE OF URINE PIGMENT   Protein, ur (A) NEGATIVE mg/dL    TEST NOT REPORTED DUE TO COLOR INTERFERENCE OF URINE PIGMENT   Nitrite (A) NEGATIVE    TEST NOT REPORTED DUE TO COLOR INTERFERENCE OF URINE PIGMENT   Leukocytes,Ua (A) NEGATIVE    TEST NOT REPORTED DUE TO COLOR INTERFERENCE OF URINE PIGMENT   RBC / HPF >50 (H) 0 - 5 RBC/hpf   WBC, UA 21-50 0 - 5 WBC/hpf   Bacteria, UA RARE (A) NONE SEEN    Comment: Performed at [facility], [place] [address], [place], [place] [address]  Type and screen [place]     Status: None (Preliminary result)   Collection Time: 01/11/22  7:40 PM  Result Value Ref Range   ABO/RH(D) B POS    Antibody Screen NEG    Sample Expiration 01/14/2022,2359    Unit Number [id number]    Blood Component Type RED CELLS,LR    Unit division 00    Status of Unit ALLOCATED    Transfusion Status OK TO TRANSFUSE    Crossmatch Result Compatible    Unit Number [id number]    Blood Component Type RBC LR PHER2    Unit division 00    Status of Unit ISSUED    Transfusion Status OK TO TRANSFUSE    Crossmatch Result      Compatible Performed at [facility], [place] [address], [place], [place] [address]   CBC with Differential     Status: Abnormal   Collection Time: 01/11/22  8:00 PM  Result Value Ref Range   WBC 5.3 4.0 - 10.5 K/uL   RBC 1.89 (L) 4.22 - 5.81 MIL/uL   Hemoglobin 4.9 (LL) 13.0 - 17.0 g/dL    Comment: REPEATED TO VERIFY CRITICAL RESULT CALLED TO, READ BACK BY AND VERIFIED WITH: J.PLASTER, RN AT 2057 ON 07.13.23 BY N.THOMPSON    HCT 17.1 (L) 39.0 - 52.0 %   MCV 90.5 80.0 - 100.0 fL   MCH 25.9 (L) 26.0  - 34.0 pg   MCHC 28.7 (L) 30.0 - 36.0 g/dL   RDW 17.3 (H) 11.5 - 15.5 %   Platelets 215 150 - 400 K/uL   nRBC 0.8 (H) 0.0 - 0.2 %   Neutrophils Relative % 67 %   Neutro Abs 3.5 1.7 - 7.7 K/uL   Lymphocytes Relative 16 %   Lymphs Abs 0.8 0.7 - 4.0 K/uL   Monocytes Relative 14 %   Monocytes Absolute 0.7 0.1 -  1.0 K/uL   Eosinophils Relative 3 %   Eosinophils Absolute 0.2 0.0 - 0.5 K/uL   Basophils Relative 0 %   Basophils Absolute 0.0 0.0 - 0.1 K/uL   Immature Granulocytes 0 %   Abs Immature Granulocytes 0.02 0.00 - 0.07 K/uL    Comment: Performed at [facility], [place] [address], [place], [place] [address]  Basic metabolic panel     Status: Abnormal   Collection Time: 01/11/22  8:00 PM  Result Value Ref Range   Sodium 142 135 - 145 mmol/L   Potassium 4.2 3.5 - 5.1 mmol/L   Chloride 111 98 - 111 mmol/L   CO2 27 22 - 32 mmol/L   Glucose, Bld 125 (H) 70 - 99 mg/dL    Comment: Glucose reference range applies only to samples taken after fasting for at least 8 hours.   BUN 12 8 - 23 mg/dL   Creatinine, Ser 1.04 0.61 - 1.24 mg/dL   Calcium 8.6 (L) 8.9 - 10.3 mg/dL   GFR, Estimated >60 >60 mL/min    Comment: (NOTE) Calculated using the CKD-EPI Creatinine Equation (2021)    Anion gap 4 (L) 5 - 15    Comment: Performed at [facility], [place] [address] [place], [place] [address]  ABO/Rh     Status: None   Collection Time: 01/11/22  8:00 PM  Result Value Ref Range   ABO/RH(D)      B POS Performed at [facility], [place] [address] [place], [place] [address]   Prepare RBC (crossmatch)     Status: None   Collection Time: 01/11/22  8:55 PM  Result Value Ref Range   Order Confirmation      ORDER PROCESSED BY BLOOD BANK Performed at [facility], [place] [address], [place], [place] [address]    No results found.  Pending Labs Unresulted Labs (From admission, onward)     Start     Ordered   01/12/22 0500  CBC   Tomorrow morning,   R        01/12/22 0049   01/11/22 1905  Urine Culture  Once,   URGENT       Question:  Indication  Answer:  Acute gross hematuria   01/11/22 1906            Vitals/Pain Today's Vitals   01/11/22 2130 01/11/22 2145 01/11/22 2327 01/11/22 2345  BP: 126/77  131/71 122/74  Pulse: 83 88 85 82  Resp: '17 17 19 18  '$ Temp:   98.1 F (36.7 C) 98.4 F (36.9 C)  TempSrc:   Oral Oral  SpO2: 100% 100% 99% 100%  Weight:      Height:      PainSc:        Isolation Precautions No active isolations  Medications Medications  cefTRIAXone (ROCEPHIN) 1 g in sodium chloride 0.9 % 100 mL IVPB (has no administration in time range)  magnesium oxide (MAG-OX) tablet 400 mg (has no administration in time range)  finasteride (PROSCAR) tablet 5 mg (has no administration in time range)  folic acid (FOLVITE) tablet 1 mg (has no administration in time range)  vitamin B-12 (CYANOCOBALAMIN) tablet 1,000 mcg (has no administration in time range)  gabapentin (NEURONTIN) capsule 300 mg (has no administration in time range)  oxycodone (OXY-IR) immediate release capsule 5 mg (has no administration in time range)  sodium chloride 0.9 % bolus 1,000 mL (1,000 mLs Intravenous New Bag/Given 01/11/22 1958)  0.9 %  sodium chloride infusion (10 mL/hr Intravenous New Bag/Given 01/11/22 2130)    Mobility wheelchair Moderate fall risk   Focused Assessments    R Recommendations: See Admitting Provider Note  Report given to:   Additional Notes:

## 2022-01-12 NOTE — Assessment & Plan Note (Addendum)
Secondary to worsening of chronic hematuria -In the setting of prostate cancer s/p seeding/radiation  -chronic urinary retention s/p suprapubic catheter -Hemoglobin on presentation of 4.9. Follow post H&H.  -The patient's hemoglobin is stable at 7.6.   Transfusion threshold of hemoglobin of less than 7. -Continue to monitor hemoglobin.

## 2022-01-12 NOTE — Progress Notes (Signed)
Nutrition Brief Note  Patient identified on the Malnutrition Screening Tool (MST) Report; score of 2.0  Wt Readings from Last 15 Encounters:  01/11/22 88.5 kg  11/13/21 88.5 kg  06/05/21 83 kg  05/30/21 83 kg  05/27/21 79.8 kg  04/18/21 84.8 kg  11/03/20 80.7 kg  05/16/20 80.2 kg  04/13/20 77.1 kg  02/22/20 78 kg  01/18/20 80.4 kg  07/10/19 76.7 kg  07/07/19 74.4 kg  06/05/19 74.6 kg    Body mass index is 25.73 kg/m. Patient meets criteria for overweight status based on current BMI.   Current diet order is Carb Modified. Diet advanced from NPO today at 2993.  Patient laying in bed with his daughter at bedside. Patient has dentures but does not have them here in the hospital. He reports that his dentures fit well. He denies any changes in appetite or PO intakes PTA. He reports that he gained weight from the 160 lb range to the 190 lb range and weight is now trending back down.   Patient and daughter deny any nutrition-related needs at this time. Ordered lunch per patient's preferences: spaghetti with meat sauce, a bottle of water, and apple juice.  Labs and medications reviewed.   No nutrition interventions warranted at this time. If nutrition issues arise, please consult RD.      Jarome Matin, MS, RD, LDN, [place] Registered Dietitian II Inpatient Clinical Nutrition RD pager # and on-call/weekend pager # available in [facility]

## 2022-01-12 NOTE — Assessment & Plan Note (Signed)
Diet controlled.  

## 2022-01-12 NOTE — Progress Notes (Signed)
[facility] PROGRESS NOTE    Progress Note  Nathan [name]  TIW:[id number] DOB: 01-03-1946 DOA: 01/11/2022 PCP: [name], MD     Brief Narrative:   Nathan [name] is an 76 y.o. male past medical history significant for prostate cancer status post radioactive seed implant with suprapubic catheter and chronic hematuria, chronic atrial fibrillation, diabetes mellitus type 2 recent history of DVT status post IVC filter who comes in for gross hematuria patient is a limited historian and most of the history was obtained from the daughter, went to his PCP his hemoglobin was 6 was sent to the ED in the ED, it was 5.   Assessment/Plan:   Symptomatic anemia secondary to gross hematuria: Getting 2 units of packed red blood cells CBC posttransfusion. Patient has a suprapubic catheter. Urology was notified and they recommended to start IV antibiotics and keep n.p.o. for possible cystoscopy. Urine cultures were sent.  He was started on IV Rocephin. Continue Proscar.  History of DVT: In setting of prostate cancer underwent IVC filter on 12/10/2021.  Diabetes mellitus type 2: Diet control continue check sliding scale.  Essential hypertension: Resume metoprolol hold ACE inhibitor.  Atrial fibrillation ([place]) Rate control resume his metoprolol.   DVT prophylaxis: scd Family Communication:daughter Status is: Observation The patient remains OBS appropriate and will d/c before 2 midnights.  Gross hematuria.    Code Status:     Code Status Orders  (From admission, onward)           Start     Ordered   01/12/22 0049  Full code  Continuous        01/12/22 0049           Code Status History     Date Active Date Inactive Code Status Order ID Comments User Context   12/09/2021 1710 12/11/2021 2253 Full Code [id number]  Jonnie Finner, DO Inpatient   02/22/2020 1822 02/28/2020 1641 Full Code [id number]  Jacky Kindle, MD ED      Advance Directive Documentation     Flowsheet Row Most Recent Value  Type of Advance Directive Healthcare Power of Attorney  Pre-existing out of facility DNR order (yellow form or pink MOST form) --  "MOST" Form in Place? --         IV Access:   Peripheral IV   Procedures and diagnostic studies:   No results found.   Medical Consultants:   None.   Subjective:    [name] no complaints.  Objective:    Vitals:   01/12/22 0355 01/12/22 0400 01/12/22 0450 01/12/22 0500  BP:  (!) 153/85 (!) 161/84 (!) 153/77  Pulse:  75  71  Resp:  18  12  Temp: 98.1 F (36.7 C)  98.4 F (36.9 C)   TempSrc: Oral  Oral   SpO2:  99%  100%  Weight:      Height:       SpO2: 100 %   Intake/Output Summary (Last 24 hours) at 01/12/2022 0653 Last data filed at 01/12/2022 0500 Gross per 24 hour  Intake 990.17 ml  Output 150 ml  Net 840.17 ml   Filed Weights   01/11/22 1907  Weight: 88.5 kg    Exam: General exam: In no acute distress. Respiratory system: Good air movement and clear to auscultation. Cardiovascular system: S1 & S2 heard, RRR. No JVD. Gastrointestinal system: Abdomen is nondistended, soft and nontender.  Extremities: No pedal edema. Skin: No rashes, lesions or ulcers Psychiatry: Judgement and insight  appear normal. Mood & affect appropriate.    Data Reviewed:    Labs: Basic Metabolic Panel: Recent Labs  Lab 01/11/22 2000  NA 142  K 4.2  CL 111  CO2 27  GLUCOSE 125*  BUN 12  CREATININE 1.04  CALCIUM 8.6*   GFR Estimated Creatinine Clearance: 66.2 mL/min (by C-G formula based on SCr of 1.04 mg/dL). Liver Function Tests: No results for input(s): "AST", "ALT", "ALKPHOS", "BILITOT", "PROT", "ALBUMIN" in the last 168 hours. No results for input(s): "LIPASE", "AMYLASE" in the last 168 hours. No results for input(s): "AMMONIA" in the last 168 hours. Coagulation profile No results for input(s): "INR", "PROTIME" in the last 168 hours. COVID-19 Labs  No results for input(s):  "DDIMER", "FERRITIN", "LDH", "CRP" in the last 72 hours.  Lab Results  Component Value Date   SARSCOV2NAA NEGATIVE 05/12/2020   [place] NEGATIVE 02/22/2020    CBC: Recent Labs  Lab 01/11/22 2000  WBC 5.3  NEUTROABS 3.5  HGB 4.9*  HCT 17.1*  MCV 90.5  PLT 215   Cardiac Enzymes: No results for input(s): "CKTOTAL", "CKMB", "CKMBINDEX", "TROPONINI" in the last 168 hours. BNP (last 3 results) No results for input(s): "PROBNP" in the last 8760 hours. CBG: No results for input(s): "GLUCAP" in the last 168 hours. D-Dimer: No results for input(s): "DDIMER" in the last 72 hours. Hgb A1c: No results for input(s): "HGBA1C" in the last 72 hours. Lipid Profile: No results for input(s): "CHOL", "HDL", "LDLCALC", "TRIG", "CHOLHDL", "LDLDIRECT" in the last 72 hours. Thyroid function studies: No results for input(s): "TSH", "T4TOTAL", "T3FREE", "THYROIDAB" in the last 72 hours.  Invalid input(s): "FREET3" Anemia work up: No results for input(s): "VITAMINB12", "FOLATE", "FERRITIN", "TIBC", "IRON", "RETICCTPCT" in the last 72 hours. Sepsis Labs: Recent Labs  Lab 01/11/22 2000  WBC 5.3   Microbiology No results found for this or any previous visit (from the past 240 hour(s)).   Medications:    finasteride  5 mg Oral Daily   folic acid  1 mg Oral Daily   gabapentin  300 mg Oral TID   magnesium oxide  400 mg Oral Daily   vitamin B-12  1,000 mcg Oral Daily   Continuous Infusions:  cefTRIAXone (ROCEPHIN)  IV Stopped (01/12/22 0206)      LOS: 0 days   Charlynne Cousins  [facility]  01/12/2022, 6:53 AM

## 2022-01-12 NOTE — Assessment & Plan Note (Addendum)
The patient is normotensive on lisionpril and metoprolol. Monitor.

## 2022-01-12 NOTE — H&P (Addendum)
History and Physical    Patient: Nathan [name] IDP:[id number] DOB: January 31, 1946 DOA: 01/11/2022 DOS: the patient was seen and examined on 01/12/2022 PCP: [name], MD  Patient coming from: Home  Chief Complaint:  Chief Complaint  Patient presents with   Abnormal Lab   HPI: Nathan [name] is a 76 y.o. male with medical history significant of prostate cancer s/p radioactive seed implant with suprapubic catheter with chronic hematuria, atrial fibrillation, WPW, T2DM, HTN, recent DVT s/p IVC filter who presents with worsening gross hematuria.  Patient is a limited historian.  Reportedly he has had increased weakness and fatigue for the past few days.  Daughter has also noticed more increased hematuria from his catheter.  She took him to PCP this week with hemoglobin at 6 and was advised to present to the ED.  Repeat hemoglobin in the ED of 4.9 with a prior of 8.8 back in June.  He was ordered 2 unit PRBC and started on IV Rocephin in the ED.  Urine sent for culture.  EDP discussed with on-call urology resident and they recommend keeping patient n.p.o. with possible cystoscopy in the morning. Review of Systems: unable to review all systems due to the inability of the patient to answer questions. Past Medical History:  Diagnosis Date   Anginal pain ([place])    Atrial fibrillation ([place]) 01/2020   resolved now    Cancer of prostate ([place]) 2020   Chronic anticoagulation    Diabetes mellitus without complication ([place])    type 2 diet controlled   Dysrhythmia    a fib   Foley catheter in place    will be changed 05-16-20   HTN (hypertension)    Lipoma of chest wall    Pneumonia 01/2020   and uti in hospital for 1 week   WPW (Wolff-Parkinson-White syndrome)    No prior ablation   Past Surgical History:  Procedure Laterality Date   CATARACT EXTRACTION Bilateral 2020   CYSTOSCOPY N/A 06/05/2021   Procedure: CYSTOSCOPY WITH SUPRA PUBIC TUBE PLACEMENT;  Surgeon: Raynelle Bring, MD;   Location: [facility];  Service: Urology;  Laterality: N/A;   LIPOMA EXCISION Left 05/16/2020   Procedure: EXCISION LIPOMA LEFT CHEST WALL;  Surgeon: Kinsinger, Arta Bruce, MD;  Location: [facility];  Service: General;  Laterality: Left;   RADIOACTIVE SEED IMPLANT  2020   VENA CAVA FILTER PLACEMENT Right 12/10/2021   Procedure: INSERTION VENA-CAVA FILTER Right Femoral;  Surgeon: Waynetta Sandy, MD;  Location: [facility];  Service: Vascular;  Laterality: Right;   Social History:  reports that he quit smoking about 2 years ago. His smoking use included cigarettes. He has a 30.00 pack-year smoking history. He has never used smokeless tobacco. He reports that he does not currently use alcohol. He reports that he does not use drugs.  No Known Allergies  Family History  Problem Relation Age of Onset   Lung cancer Mother    Cervical cancer Sister    Breast cancer Neg Hx    Pancreatic cancer Neg Hx    Colon cancer Neg Hx     Prior to Admission medications   Medication Sig Start Date End Date Taking? Authorizing Provider  Ergocalciferol (VITAMIN D2 PO) Take 50,000 Units by mouth daily.   Yes [provider]  finasteride (PROSCAR) 5 MG tablet Take 5 mg by mouth daily.   Yes [provider]  folic acid (FOLVITE) 1 MG tablet Take 1 mg by mouth daily. 03/24/19  Yes  [provider]  gabapentin (NEURONTIN) 300 MG capsule Take 300 mg by mouth 3 (three) times daily. 10/27/21  Yes [provider]  lisinopril (ZESTRIL) 40 MG tablet Take 40 mg by mouth daily. 09/24/20  Yes [provider]  magnesium oxide (MAG-OX) 400 MG tablet Take 400 mg by mouth daily.  03/24/19  Yes [provider]  metoprolol succinate (TOPROL-XL) 100 MG 24 hr tablet Take 100 mg by mouth daily. 11/14/21  Yes [provider]  oxycodone (OXY-IR) 5 MG capsule Take 5 mg by mouth every 6 (six) hours as needed for pain.   Yes [provider]  Prenatal Vit-Fe  Fumarate-FA (PRENATAL MULTIVITAMIN) TABS tablet Take 1 tablet by mouth daily at 12 noon.   Yes [provider]  vitamin B-12 (CYANOCOBALAMIN) 1000 MCG tablet Take 1,000 mcg by mouth daily.   Yes [provider]    Physical Exam: Vitals:   01/11/22 2130 01/11/22 2145 01/11/22 2327 01/11/22 2345  BP: 126/77  131/71 122/74  Pulse: 83 88 85 82  Resp: '17 17 19 18  '$ Temp:   98.1 F (36.7 C) 98.4 F (36.9 C)  TempSrc:   Oral Oral  SpO2: 100% 100% 99% 100%  Weight:      Height:       Constitutional: NAD, calm, comfortable, elderly gentleman lying flat in bed asleep Eyes: lids and conjunctivae normal ENMT: Mucous membranes are moist. Neck: normal, supple Respiratory: clear to auscultation bilaterally, no wheezing, no crackles. Normal respiratory effort.  Cardiovascular: Regular rate and rhythm, no murmurs / rubs / gallops.  +4 pitting edema of left lower extremity.  2+ pedal pulses. No carotid bruits.  Abdomen: Soft, nondistended and nontender.  Bowel sounds positive.  Suprapubic catheter in place with gross hematuria but no clots. Musculoskeletal: no clubbing / cyanosis. No joint deformity upper and lower extremities.  Skin: no rashes, lesions, ulcers.  Neurologic: CN 2-12 grossly intact.  Psychiatric: Alert and oriented to self, place and time but not current events.. Normal mood. Data Reviewed:  See HPI  Assessment and Plan: * Symptomatic anemia Secondary to worsening of chronic hematuria -In the setting of prostate cancer s/p seeding/radiation  -chronic urinary retention s/p suprapubic catheter -Hemoglobin on presentation of 4.9.  Will transfuse 2 unit PRBC.  Follow post H&H.  Transfusion threshold of hemoglobin of less than 7 -EDP discussed with overnight urology resident and advise starting IV antibiotics for presumed UTI and keeping n.p.o. for possible cystoscopy in the morning.  Urine culture is pending.    History of DVT (deep vein thrombosis) LLE in the  Setting of prostate cancer.  Complicated by chronic hematuria.  Underwent IVC filter placement on 12/10/2021.  DM2 (diabetes mellitus, type 2) ([place]) Diet-controlled  HTN (hypertension) Holding antihypertensives for now with profound anemia  Atrial fibrillation ([place]) Off Eliquis since February.  Deemed not a good candidate due to chronic hematuria.  Had discussion with cardiology regarding Watchman device back in May.      Advance Care Planning:   Code Status: Full Code -presumed but will need to follow-up in the morning with daughter  Consults: Urology  Family Communication: No family at bedside  Severity of Illness: The appropriate patient status for this patient is OBSERVATION. Observation status is judged to be reasonable and necessary in order to provide the required intensity of service to ensure the patient's safety. The patient's presenting symptoms, physical exam findings, and initial radiographic and laboratory data in the context of their medical condition is  felt to place them at decreased risk for further clinical deterioration. Furthermore, it is anticipated that the patient will be medically stable for discharge from the hospital within 2 midnights of admission.   Author: Orene Desanctis, DO 01/12/2022 1:12 AM  For on call review [email or web address].

## 2022-01-13 ENCOUNTER — Inpatient Hospital Stay (HOSPITAL_COMMUNITY): Payer: Medicare Other

## 2022-01-13 DIAGNOSIS — D649 Anemia, unspecified: Secondary | ICD-10-CM | POA: Diagnosis not present

## 2022-01-13 DIAGNOSIS — I4891 Unspecified atrial fibrillation: Secondary | ICD-10-CM | POA: Diagnosis not present

## 2022-01-13 DIAGNOSIS — E119 Type 2 diabetes mellitus without complications: Secondary | ICD-10-CM | POA: Diagnosis not present

## 2022-01-13 DIAGNOSIS — R310 Gross hematuria: Secondary | ICD-10-CM | POA: Diagnosis not present

## 2022-01-13 LAB — BASIC METABOLIC PANEL
Anion gap: 5 (ref 5–15)
BUN: 10 mg/dL (ref 8–23)
CO2: 25 mmol/L (ref 22–32)
Calcium: 8.3 mg/dL — ABNORMAL LOW (ref 8.9–10.3)
Chloride: 110 mmol/L (ref 98–111)
Creatinine, Ser: 0.97 mg/dL (ref 0.61–1.24)
GFR, Estimated: >60 mL/min (ref >60)
Glucose, Bld: 96 mg/dL (ref 70–99)
Potassium: 4.1 mmol/L (ref 3.5–5.1)
Sodium: 140 mmol/L (ref 135–145)

## 2022-01-13 LAB — GLUCOSE, CAPILLARY
Glucose-Capillary: 87 mg/dL (ref 70–99)
Glucose-Capillary: 84 mg/dL (ref 70–99)
Glucose-Capillary: 126 mg/dL — ABNORMAL HIGH (ref 70–99)
Glucose-Capillary: 113 mg/dL — ABNORMAL HIGH (ref 70–99)

## 2022-01-13 LAB — URINE CULTURE: Culture: <10000 — AB

## 2022-01-13 LAB — CBC
HCT: 24.1 % — ABNORMAL LOW (ref 39.0–52.0)
Hemoglobin: 7.4 g/dL — ABNORMAL LOW (ref 13.0–17.0)
MCH: 24.9 pg — ABNORMAL LOW (ref 26.0–34.0)
MCHC: 30.7 g/dL (ref 30.0–36.0)
MCV: 81.1 fL (ref 80.0–100.0)
Platelets: 199 10*3/uL (ref 150–400)
RBC: 2.97 MIL/uL — ABNORMAL LOW (ref 4.22–5.81)
RDW: 21.2 % — ABNORMAL HIGH (ref 11.5–15.5)
WBC: 8.7 10*3/uL (ref 4.0–10.5)
nRBC: 0.7 % — ABNORMAL HIGH (ref 0.0–0.2)

## 2022-01-13 LAB — HEMOGLOBIN AND HEMATOCRIT, BLOOD
HCT: 27.2 % — ABNORMAL LOW (ref 39.0–52.0)
Hemoglobin: 8.3 g/dL — ABNORMAL LOW (ref 13.0–17.0)

## 2022-01-13 LAB — PREPARE RBC (CROSSMATCH)

## 2022-01-13 MED ORDER — IOHEXOL 350 MG/ML SOLN
100.0000 mL | Freq: Once | INTRAVENOUS | Status: AC | PRN
Start: 2022-01-13 — End: 2022-01-13
  Administered 2022-01-13: 100 mL via INTRAVENOUS

## 2022-01-13 MED ORDER — DEGARELIX ACETATE(240 MG DOSE) 120 MG/VIAL ~~LOC~~ SOLR
240.0000 mg | Freq: Once | SUBCUTANEOUS | Status: AC
Start: 2022-01-13 — End: 2022-01-13
  Administered 2022-01-13: 240 mg via SUBCUTANEOUS
  Filled 2022-01-13: qty 6

## 2022-01-13 MED ORDER — SODIUM CHLORIDE 0.9% IV SOLUTION: Freq: Once | INTRAVENOUS | Status: AC

## 2022-01-13 MED ORDER — SODIUM CHLORIDE (PF) 0.9 % IJ SOLN
INTRAMUSCULAR | Status: AC
  Filled 2022-01-13: qty 50

## 2022-01-13 NOTE — Consult Note (Signed)
Chief Complaint: Patient was seen in consultation today for prostate artery embolization  Referring Physician(s): Festus Aloe, MD  Supervising Physician: Michaelle Birks  Patient Status: [facility] - In-pt  History of Present Illness: Nathan [name] is a 76 y.o. male with a past medical history significant for DM, HTN, WPW, a.fib (not on anticoagulation), DVT s/p IVC filter placement, suprapubic catheter dependent since 2020 and prostate cancer s/p brachytherapy who presented to [facility] ED on 01/11/22 with complaints of hematuria from is SP tube. He was found to have hgb of 4.9 and required blood transfusion. 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 He was admitted and urology was consulted, they have recommended possible prostate artery embolization for recurrent hematuria. IR has been consulted for same.   Patient seen in the ICU, he denies any complaints right now - he states that his daughter Nathan [name] is the person who signs consent for him, he does not want to talk about the details of the procedure but states he is agreeable as long as Nathan [name] is. I discussed the procedure indications, risks, benefits with Nathan [name] via phone today - she is agreeable for her father to undergo the procedure and will sign consent form when she visits this weekend. She is hopeful he will able to be released by Friday because they have a family reunion to attend.   Past Medical History:  Diagnosis Date   Anginal pain ([place])    Atrial fibrillation ([place]) 01/2020   resolved now    Cancer of prostate ([place]) 2020   Chronic anticoagulation    Diabetes mellitus without complication ([place])    type 2 diet controlled   Dysrhythmia    a fib   Foley catheter in place    will be changed 05-16-20   HTN (hypertension)    Lipoma of chest wall    Pneumonia 01/2020   and uti in hospital for 1 week   WPW (Wolff-Parkinson-White syndrome)    No prior ablation     Past Surgical History:  Procedure Laterality Date   CATARACT EXTRACTION Bilateral 2020   CYSTOSCOPY N/A 06/05/2021   Procedure: CYSTOSCOPY WITH SUPRA PUBIC TUBE PLACEMENT;  Surgeon: Raynelle Bring, MD;  Location: [facility];  Service: Urology;  Laterality: N/A;   LIPOMA EXCISION Left 05/16/2020   Procedure: EXCISION LIPOMA LEFT CHEST WALL;  Surgeon: Kinsinger, Arta Bruce, MD;  Location: [place];  Service: General;  Laterality: Left;   RADIOACTIVE SEED IMPLANT  2020   VENA CAVA FILTER PLACEMENT Right 12/10/2021   Procedure: INSERTION VENA-CAVA FILTER Right Femoral;  Surgeon: Waynetta Sandy, MD;  Location: [place];  Service: Vascular;  Laterality: Right;    Allergies: Patient has no known allergies.  Medications: Prior to Admission medications   Medication Sig Start Date End Date Taking? Authorizing Provider  Ergocalciferol (VITAMIN D2 PO) Take 50,000 Units by mouth daily.   Yes [provider]  finasteride (PROSCAR) 5 MG tablet Take 5 mg by mouth daily.   Yes [provider]  folic acid (FOLVITE) 1 MG tablet Take 1 mg by mouth daily. 03/24/19  Yes [provider]  gabapentin (NEURONTIN) 300 MG capsule Take 300 mg by mouth 3 (three) times daily. 10/27/21  Yes [provider]  lisinopril (ZESTRIL) 40 MG tablet Take 40 mg by mouth daily. 09/24/20  Yes [provider]  magnesium oxide (MAG-OX) 400 MG tablet Take 400 mg by mouth daily.  03/24/19  Yes  [provider]  metoprolol succinate (TOPROL-XL) 100 MG 24 hr tablet Take 100 mg by mouth daily. 11/14/21  Yes [provider]  oxycodone (OXY-IR) 5 MG capsule Take 5 mg by mouth every 6 (six) hours as needed for pain.   Yes [provider]  Prenatal Vit-Fe Fumarate-FA (PRENATAL MULTIVITAMIN) TABS tablet Take 1 tablet by mouth daily at 12 noon.   Yes [provider]  vitamin B-12 (CYANOCOBALAMIN) 1000 MCG tablet Take 1,000 mcg by mouth daily.    Yes [provider]     Family History  Problem Relation Age of Onset   Lung cancer Mother    Cervical cancer Sister    Breast cancer Neg Hx    Pancreatic cancer Neg Hx    Colon cancer Neg Hx     Social History   Socioeconomic History   Marital status: Married    Spouse name: Not on file   Number of children: 9   Years of education: Not on file   Highest education level: Not on file  Occupational History   Not on file  Tobacco Use   Smoking status: Former    Packs/day: 0.50    Years: 60.00    Total pack years: 30.00    Types: Cigarettes    Quit date: 01/31/2019    Years since quitting: 2.9   Smokeless tobacco: Never  Vaping Use   Vaping Use: Never used  Substance and Sexual Activity   Alcohol use: Not Currently    Comment: no alochol since 2020   Drug use: Never   Sexual activity: Not Currently  Other Topics Concern   Not on file  Social History Narrative   Not on file   Social Determinants of Health   Financial Resource Strain: Not on file  Food Insecurity: Not on file  Transportation Needs: Not on file  Physical Activity: Not on file  Stress: Not on file  Social Connections: Not on file     Review of Systems: A 12 point ROS discussed and pertinent positives are indicated in the HPI above.  All other systems are negative.  Review of Systems  Constitutional:  Negative for chills and fever.  Respiratory:  Negative for cough and shortness of breath.   Cardiovascular:  Negative for chest pain.  Gastrointestinal:  Negative for abdominal pain, nausea and vomiting.  Genitourinary:  Positive for hematuria.  Musculoskeletal:  Negative for back pain.  Neurological:  Negative for dizziness and headaches.    Vital Signs: BP (!) 177/82   Pulse 71   Temp 98.4 F (36.9 C) (Oral)   Resp 17   Ht '6\' 1"'$  (1.854 m)   Wt 195 lb (88.5 kg)   SpO2 100%   BMI 25.73 kg/m   Physical Exam Vitals and nursing note reviewed.  Constitutional:      General: He  is not in acute distress. HENT:     Head: Normocephalic.     Mouth/Throat:     Mouth: Mucous membranes are moist.     Pharynx: Oropharynx is clear. No oropharyngeal exudate or posterior oropharyngeal erythema.  Cardiovascular:     Rate and Rhythm: Normal rate and regular rhythm.  Pulmonary:     Effort: Pulmonary effort is normal.     Breath sounds: Normal breath sounds.  Abdominal:     Palpations: Abdomen is soft.     Comments: (+) SP catheter draining dark red urine  Skin:    General: Skin is warm and dry.  Neurological:     Mental Status: He is alert. Mental status is at baseline.  Psychiatric:        Mood and Affect: Mood normal.        Behavior: Behavior normal.        Thought Content: Thought content normal.        Judgment: Judgment normal.      MD Evaluation Airway: WNL Heart: WNL Abdomen: WNL Chest/ Lungs: WNL Other Pertinent Findings: SP catheter, hematuria ASA  Classification: 3 Mallampati/Airway Score: Two   Imaging: No results found.  Labs:  CBC: Recent Labs    01/11/22 2000 01/12/22 0626 01/12/22 1254 01/12/22 2222 01/13/22 0737  WBC 5.3 6.1  --  9.4 8.7  HGB 4.9* 6.3* 7.5* 7.6* 7.4*  HCT 17.1* 21.2* 24.6* 25.3* 24.1*  PLT 215 207  --  217 199    COAGS: No results for input(s): "INR", "APTT" in the last 8760 hours.  BMP: Recent Labs    12/10/21 0234 12/11/21 0616 12/11/21 0935 01/11/22 2000 01/13/22 0737  NA 135 138  --  142 140  K 3.8 5.3* 4.3 4.2 4.1  CL 104 106  --  111 110  CO2 24 24  --  27 25  GLUCOSE 95 142*  --  125* 96  BUN 10 24*  --  12 10  CALCIUM 7.9* 9.0  --  8.6* 8.3*  CREATININE 1.19 1.14  --  1.04 0.97  GFRNONAA >60 >60  --  >60 >60    LIVER FUNCTION TESTS: Recent Labs    12/09/21 1105 12/10/21 0234  BILITOT 0.7 0.8  AST 18 16  ALT 9 10  ALKPHOS 67 53  PROT 7.1 5.8*  ALBUMIN 3.5 2.7*    TUMOR MARKERS: No results for input(s): "AFPTM", "CEA", "CA199", "CHROMGRNA" in the last 8760  hours.  Assessment and Plan:  76 y/o M with history of prostate cancer s/p brachytherapy with recurrent gross hematuria secondary to radiation admitted with hematuria and significant drop in hgb (4.9 on admission 7/13). IR has been consulted by urology for a prostate artery embolization due to recurrent bleeding. Patient history and imaging reviewed by Dr. Maryelizabeth Kaufmann who approves patient for procedure.  Patient tentatively planned for procedure 01/17/22 pending insurance approval. Patient to be NPO midnight of 7/19, CBC/INR/BMP AM of 7/19, no current anticoagulation. IR will follow up with team regarding procedure timing, please call with questions or concerns.  Risks and benefits of abdominal/pelvic arteriogram with prostate artery embolization were discussed with the patient's daughter Nathan [name] via phone including, but not limited to bleeding, infection, vascular injury, contrast induced renal failure, reperfusion hemorrhage, or even death.  This interventional procedure involves the use of X-rays and because of the nature of the planned procedure, it is possible that we will have prolonged use of X-ray fluoroscopy. Potential radiation risks to you include (but are not limited to) the following: - A slightly elevated risk for cancer  several years later in life. This risk is typically less than 0.5% percent. This risk is low in comparison to the normal incidence of human cancer, which is 33% for women and 50% for men according to the [employer]. - Radiation induced injury can include skin redness, resembling a rash, tissue breakdown / ulcers and hair loss (which can be temporary or permanent).  The likelihood of either of these occurring depends on the difficulty of the procedure and whether you are sensitive to radiation due to previous procedures, disease, or  genetic conditions.  IF your procedure requires a prolonged use of radiation, you will be notified and given written  instructions for further action.  It is your responsibility to monitor the irradiated area for the 2 weeks following the procedure and to notify your physician if you are concerned that you have suffered a radiation induced injury.    All of the patient's daughter's questions were answered, patient and his daughter are agreeable to proceed.  Order for RN to obtain consent from patient's daughter when she comes to visit.  Thank you for this interesting consult.  I greatly enjoyed meeting Nathan [name] and look forward to participating in their care.  A copy of this report was sent to the requesting provider on this date.  Electronically Signed: Joaquim Nam, PA-C 01/13/2022, 12:29 PM   I spent a total of 6 Miinutes in face to face in clinical consultation, greater than 50% of which was counseling/coordinating care for prostate artery embolization.

## 2022-01-13 NOTE — Progress Notes (Addendum)
[facility] PROGRESS NOTE    Progress Note  Nathan [name]  ZOX:[id number] DOB: 11/28/1945 DOA: 01/11/2022 PCP: [name], MD     Brief Narrative:   Nathan [name] is an 76 y.o. male past medical history significant for prostate cancer status post radioactive seed implant with suprapubic catheter and chronic hematuria, chronic atrial fibrillation, diabetes mellitus type 2 recent history of DVT status post IVC filter who comes in for gross hematuria patient is a limited historian and most of the history was obtained from the daughter, went to his PCP his hemoglobin was 6 was sent to the ED in the ED, it was 5.   Assessment/Plan:   Symptomatic anemia secondary to gross hematuria possibly due to complicated UTI in the setting of suprapubic catheter Status post 2 units packed red blood cells his hemoglobin this morning 7.6. Urology reevaluated the patient, commended IR to evaluate for PEA. Cultures have remained negative till date, continue IV Rocephin. Continue Proscar. Continues to have gross hematuria further management per urology. His hemoglobin 7.6 today we will go ahead and give him a unit of packed red blood cells, he relates he feels weak and tired  History of DVT: In setting of prostate cancer underwent IVC filter on 12/10/2021.  Diabetes mellitus type 2: Diet control continue check sliding scale.  Essential hypertension: Continue metoprolol blood pressure is stable. Continue to hold ACE inhibitor.  Atrial fibrillation ([place]) Rate control resume his metoprolol.   DVT prophylaxis: scd Family Communication:daughter Status is: Observation The patient remains OBS appropriate and will d/c before 2 midnights.  Gross hematuria.    Code Status:     Code Status Orders  (From admission, onward)           Start     Ordered   01/12/22 0049  Full code  Continuous        01/12/22 0049           Code Status History     Date Active Date Inactive Code  Status Order ID Comments User Context   12/09/2021 1710 12/11/2021 2253 Full Code [id number]  Jonnie Finner, DO Inpatient   02/22/2020 1822 02/28/2020 1641 Full Code [id number]  Jacky Kindle, MD ED      Advance Directive Documentation    Flowsheet Row Most Recent Value  Type of Advance Directive Healthcare Power of Attorney  Pre-existing out of facility DNR order (yellow form or pink MOST form) --  "MOST" Form in Place? --         IV Access:   Peripheral IV   Procedures and diagnostic studies:   No results found.   Medical Consultants:   None.   Subjective:    Nathan [name] has no new complaints tolerating his diet.  Objective:    Vitals:   01/13/22 0000 01/13/22 0200 01/13/22 0400 01/13/22 0600  BP: (!) 143/86 (!) 152/84 124/69 125/64  Pulse: 77 65 70 60  Resp: (!) 22 (!) '23 16 13  '$ Temp: 98.3 F (36.8 C)  100 F (37.8 C)   TempSrc: Oral  Axillary   SpO2: 98% 100% 98% 97%  Weight:      Height:       SpO2: 97 %   Intake/Output Summary (Last 24 hours) at 01/13/2022 0721 Last data filed at 01/13/2022 0600 Gross per 24 hour  Intake 962.1 ml  Output 2550 ml  Net -1587.9 ml    Filed Weights   01/11/22 1907  Weight: 88.5 kg  Exam: General exam: In no acute distress. Respiratory system: Good air movement and clear to auscultation. Cardiovascular system: S1 & S2 heard, RRR. No JVD. Gastrointestinal system: Abdomen is nondistended, soft and nontender.  Extremities: No pedal edema. Skin: No rashes, lesions or ulcers Psychiatry: Judgement and insight appear normal. Mood & affect appropriate.  Data Reviewed:    Labs: Basic Metabolic Panel: Recent Labs  Lab 01/11/22 2000  NA 142  K 4.2  CL 111  CO2 27  GLUCOSE 125*  BUN 12  CREATININE 1.04  CALCIUM 8.6*    GFR Estimated Creatinine Clearance: 66.2 mL/min (by C-G formula based on SCr of 1.04 mg/dL). Liver Function Tests: No results for input(s): "AST", "ALT", "ALKPHOS", "BILITOT",  "PROT", "ALBUMIN" in the last 168 hours. No results for input(s): "LIPASE", "AMYLASE" in the last 168 hours. No results for input(s): "AMMONIA" in the last 168 hours. Coagulation profile No results for input(s): "INR", "PROTIME" in the last 168 hours. COVID-19 Labs  No results for input(s): "DDIMER", "FERRITIN", "LDH", "CRP" in the last 72 hours.  Lab Results  Component Value Date   SARSCOV2NAA NEGATIVE 05/12/2020   [place] NEGATIVE 02/22/2020    CBC: Recent Labs  Lab 01/11/22 2000 01/12/22 0626 01/12/22 1254 01/12/22 2222  WBC 5.3 6.1  --  9.4  NEUTROABS 3.5  --   --   --   HGB 4.9* 6.3* 7.5* 7.6*  HCT 17.1* 21.2* 24.6* 25.3*  MCV 90.5 83.8  --  81.6  PLT 215 207  --  217    Cardiac Enzymes: No results for input(s): "CKTOTAL", "CKMB", "CKMBINDEX", "TROPONINI" in the last 168 hours. BNP (last 3 results) No results for input(s): "PROBNP" in the last 8760 hours. CBG: Recent Labs  Lab 01/12/22 0822 01/12/22 1217 01/12/22 1556 01/12/22 2146  GLUCAP 96 97 109* 116*   D-Dimer: No results for input(s): "DDIMER" in the last 72 hours. Hgb A1c: No results for input(s): "HGBA1C" in the last 72 hours. Lipid Profile: No results for input(s): "CHOL", "HDL", "LDLCALC", "TRIG", "CHOLHDL", "LDLDIRECT" in the last 72 hours. Thyroid function studies: No results for input(s): "TSH", "T4TOTAL", "T3FREE", "THYROIDAB" in the last 72 hours.  Invalid input(s): "FREET3" Anemia work up: No results for input(s): "VITAMINB12", "FOLATE", "FERRITIN", "TIBC", "IRON", "RETICCTPCT" in the last 72 hours. Sepsis Labs: Recent Labs  Lab 01/11/22 2000 01/12/22 0626 01/12/22 2222  WBC 5.3 6.1 9.4    Microbiology Recent Results (from the past 240 hour(s))  Urine Culture     Status: None (Preliminary result)   Collection Time: 01/11/22  7:05 PM   Specimen: Urine, Suprapubic  Result Value Ref Range Status   Specimen Description   Final    URINE, SUPRAPUBIC Performed at [facility] [address] [place], [place] [address]    Special Requests   Final    NONE Performed at [facility], [place] [address] [place], [place] [address]    Culture   Final    CULTURE REINCUBATED FOR BETTER GROWTH Performed at [facility], [place] [address], [place], [place] [address]    Report Status PENDING  Incomplete     Medications:    Chlorhexidine Gluconate Cloth  6 each Topical Daily   finasteride  5 mg Oral Daily   folic acid  1 mg Oral Daily   gabapentin  300 mg Oral TID   insulin aspart  0-15 Units Subcutaneous TID WC   insulin aspart  0-5 Units Subcutaneous QHS  insulin aspart  4 Units Subcutaneous TID WC   lisinopril  40 mg Oral Daily   magnesium oxide  400 mg Oral Daily   metoprolol succinate  100 mg Oral Daily   vitamin B-12  1,000 mcg Oral Daily   Continuous Infusions:  cefTRIAXone (ROCEPHIN)  IV Stopped (01/12/22 2229)      LOS: 1 day   Charlynne Cousins  [facility]  01/13/2022, 7:21 AM

## 2022-01-13 NOTE — Progress Notes (Signed)
   Subjective/Chief Complaint:   1 - Urinary retention / Massive Prostate / Poorly Contractile Bladder:- SPT dependant since 2020. UDS acontractile. >200gm prostate with large median.   2 - Moderate Risk Prostate Cancer - s/p external beam radiation 2021 for Grade 2 disease. PSA 0.14 2023  3 - Recurrent Gross Hematuria - long h/o prostate / bladder source bleeding after radiation for prostate cancer. On finasteride at baseline. Bled down to Hgb 4's 12/2021. IN midst of eval for prostate artery embolication. Firmagon given 7/15 as temporozing to help decrease prostate vacularity. ON coumadin at baselie for Afib, Does have IVC filter. CT this admission w/o large clots or upper tract lesions.   Today "Nathan [name]" is stable. Hgb stable 7's. Cr 1.04. He is still having active hematuria.    Objective: Vital signs in last 24 hours: Temp:  [98.2 F (36.8 C)-100 F (37.8 C)] 100 F (37.8 C) (07/15 0400) Pulse Rate:  [60-82] 60 (07/15 0600) Resp:  [12-23] 13 (07/15 0600) BP: (124-179)/(64-111) 125/64 (07/15 0600) SpO2:  [97 %-100 %] 97 % (07/15 0600) Last BM Date :  (PTA)  Intake/Output from previous day: 07/14 0701 - 07/15 0700 In: 962.1 [P.O.:240; Blood:332; IV Piggyback:90.1] Out: 2550 [Urine:2550] Intake/Output this shift: No intake/output data recorded.  NAD in ICU, very pleasant, RN hanging pRBC Non-labored breathing on [place] O2 SNTND SPT in place (small bore) with grossly bloody urine. No bladder distension, no large clots Some blood per penis as well No c/c/e   Lab Results:  Recent Labs    01/12/22 0626 01/12/22 1254 01/12/22 2222  WBC 6.1  --  9.4  HGB 6.3* 7.5* 7.6*  HCT 21.2* 24.6* 25.3*  PLT 207  --  217   BMET Recent Labs    01/11/22 2000  NA 142  K 4.2  CL 111  CO2 27  GLUCOSE 125*  BUN 12  CREATININE 1.04  CALCIUM 8.6*   PT/INR No results for input(s): "LABPROT", "INR" in the last 72 hours. ABG No results for input(s): "PHART", "HCO3" in the last 72  hours.  Invalid input(s): "PCO2", "PO2"  Studies/Results: No results found.  Anti-infectives: Anti-infectives (From admission, onward)    Start     Dose/Rate Route Frequency Ordered Stop   01/12/22 0100  cefTRIAXone (ROCEPHIN) 1 g in sodium chloride 0.9 % 100 mL IVPB        1 g 200 mL/hr over 30 Minutes Intravenous Every 24 hours 01/12/22 0050         Assessment/Plan:  1 - Urinary retention / Massive Prostate / Poorly Contractile Bladder:- continue SPT.  2 - Moderate Risk Prostate Cancer - excellent biochemical control  3 - Recurrent Gross Hematuria - unfortunate sequella of radiation in massive prostate. Firmagon 240 ordered today which will help decrase vascularity in medium term. Agree with path to PAE. Will order CTA pelvis for IR planning of prostate embolication. His GFR appears acceptable for contrast load. Will also consdier placemetn of large urethral catheter on traction and two catheter irrigation if sig clots develop or refracotry Hgb drop.   Greatly appreciate hospitalist team comanagmetn.    Alexis Frock 01/13/2022

## 2022-01-14 DIAGNOSIS — R310 Gross hematuria: Secondary | ICD-10-CM | POA: Diagnosis not present

## 2022-01-14 DIAGNOSIS — E119 Type 2 diabetes mellitus without complications: Secondary | ICD-10-CM | POA: Diagnosis not present

## 2022-01-14 DIAGNOSIS — I4891 Unspecified atrial fibrillation: Secondary | ICD-10-CM | POA: Diagnosis not present

## 2022-01-14 DIAGNOSIS — D649 Anemia, unspecified: Secondary | ICD-10-CM | POA: Diagnosis not present

## 2022-01-14 LAB — GLUCOSE, CAPILLARY
Glucose-Capillary: 86 mg/dL (ref 70–99)
Glucose-Capillary: 101 mg/dL — ABNORMAL HIGH (ref 70–99)
Glucose-Capillary: 108 mg/dL — ABNORMAL HIGH (ref 70–99)
Glucose-Capillary: 127 mg/dL — ABNORMAL HIGH (ref 70–99)

## 2022-01-14 LAB — CBC
HCT: 24.2 % — ABNORMAL LOW (ref 39.0–52.0)
HCT: 30.3 % — ABNORMAL LOW (ref 39.0–52.0)
Hemoglobin: 7.5 g/dL — ABNORMAL LOW (ref 13.0–17.0)
Hemoglobin: 9.2 g/dL — ABNORMAL LOW (ref 13.0–17.0)
MCH: 25 pg — ABNORMAL LOW (ref 26.0–34.0)
MCH: 25.2 pg — ABNORMAL LOW (ref 26.0–34.0)
MCHC: 31 g/dL (ref 30.0–36.0)
MCHC: 30.4 g/dL (ref 30.0–36.0)
MCV: 80.7 fL (ref 80.0–100.0)
MCV: 83 fL (ref 80.0–100.0)
Platelets: 185 10*3/uL (ref 150–400)
Platelets: 215 10*3/uL (ref 150–400)
RBC: 3 MIL/uL — ABNORMAL LOW (ref 4.22–5.81)
RBC: 3.65 MIL/uL — ABNORMAL LOW (ref 4.22–5.81)
RDW: 19.2 % — ABNORMAL HIGH (ref 11.5–15.5)
RDW: 18.8 % — ABNORMAL HIGH (ref 11.5–15.5)
WBC: 12.1 10*3/uL — ABNORMAL HIGH (ref 4.0–10.5)
WBC: 12.4 10*3/uL — ABNORMAL HIGH (ref 4.0–10.5)
nRBC: 0.7 % — ABNORMAL HIGH (ref 0.0–0.2)
nRBC: 0.4 % — ABNORMAL HIGH (ref 0.0–0.2)

## 2022-01-14 LAB — PREPARE RBC (CROSSMATCH)

## 2022-01-14 MED ORDER — SODIUM CHLORIDE 0.9% IV SOLUTION: Freq: Once | INTRAVENOUS | Status: AC

## 2022-01-14 NOTE — Progress Notes (Signed)
[facility] PROGRESS NOTE    Progress Note  Nathan [name]  [name]:[id number] DOB: 23-Jan-1946 DOA: 01/11/2022 PCP: [name], MD     Brief Narrative:   Nathan [name] is an 76 y.o. male past medical history significant for prostate cancer status post radioactive seed implant with suprapubic catheter and chronic hematuria, chronic atrial fibrillation, diabetes mellitus type 2 recent history of DVT status post IVC filter who comes in for gross hematuria patient is a limited historian and most of the history was obtained from the daughter, went to his PCP his hemoglobin was 6 was sent to the ED in the ED, it was 5.   Assessment/Plan:   Symptomatic anemia secondary to gross hematuria possibly due to complicated UTI in the setting of suprapubic catheter Urology reevaluated the patient. Patient is tentatively plan for 01/17/2022 pending insurance approval for PEA. Urine culture grew less than 10,000 colonies continue IV Rocephin. Continue Proscar. Continues to have gross hematuria further management per urology. Status post 4 units of packed red blood cells when this morning is 7.5. We will give him an additional unit tonight.  History of DVT: In setting of prostate cancer underwent IVC filter on 12/10/2021. Not on anticoagulation.  Diabetes mellitus type 2: Diet control continue check sliding scale.  Essential hypertension: Continue metoprolol blood pressure is stable. Continue to hold ACE inhibitor.  Atrial fibrillation ([place]) Rate control resume his metoprolol. Not a candidate for anticoagulation due to gross hematuria required multiple units of packed red blood cells will need to be evaluated as an outpatient.   DVT prophylaxis: scd Family Communication:daughter Status is: Observation The patient remains OBS appropriate and will d/c before 2 midnights.  Gross hematuria.    Code Status:     Code Status Orders  (From admission, onward)           Start      Ordered   01/12/22 0049  Full code  Continuous        01/12/22 0049           Code Status History     Date Active Date Inactive Code Status Order ID Comments User Context   12/09/2021 1710 12/11/2021 2253 Full Code [id number]  Jonnie Finner, DO Inpatient   02/22/2020 1822 02/28/2020 1641 Full Code [id number]  Jacky Kindle, MD ED      Advance Directive Documentation    Flowsheet Row Most Recent Value  Type of Advance Directive Healthcare Power of Attorney  Pre-existing out of facility DNR order (yellow form or pink MOST form) --  "MOST" Form in Place? --         IV Access:   Peripheral IV   Procedures and diagnostic studies:   CT Angio Abd/Pel w/ and/or w/o  Result Date: 01/14/2022 CLINICAL DATA:  Chronic kidney disease, prostate embolization mapping evaluation. EXAM: CTA ABDOMEN AND PELVIS WITHOUT AND WITH CONTRAST TECHNIQUE: Multidetector CT imaging of the abdomen and pelvis was performed using the standard protocol during bolus administration of intravenous contrast. Multiplanar reconstructed images and MIPs were obtained and reviewed to evaluate the vascular anatomy. RADIATION DOSE REDUCTION: This exam was performed according to the departmental dose-optimization program which includes automated exposure control, adjustment of the mA and/or kV according to patient size and/or use of iterative reconstruction technique. CONTRAST:  133m OMNIPAQUE IOHEXOL 350 MG/ML SOLN COMPARISON:  Prior CT scan of the abdomen and pelvis 05/27/2019 FINDINGS: VASCULAR Aorta: Normal caliber aorta without aneurysm, dissection, vasculitis or significant stenosis. Scattered atherosclerotic vascular calcifications.  Celiac: Patent without evidence of aneurysm, dissection, vasculitis or significant stenosis. SMA: Patent without evidence of aneurysm, dissection, vasculitis or significant stenosis. Renals: 2 renal arteries are present bilaterally. On the right, the main renal artery demonstrates focal  fibrofatty atherosclerotic plaque resulting in at least mild stenosis at the origin. There is a smaller accessory artery providing supply to the upper pole. On the left, the 2 renal arteries are code dominant. Mild atherosclerotic plaque at the origins without significant appearing stenosis. No dissection, aneurysm or evidence of fibromuscular dysplasia. IMA: Patent without evidence of aneurysm, dissection, vasculitis or significant stenosis. Inflow: Atherosclerotic plaque at the origin of the right internal iliac artery without significant stenosis. However, there is more significant atherosclerotic vascular disease at the bifurcation into the anterior and posterior divisions with long segment irregular stenosis of the superior gluteal artery, and bulky combined calcified and fibrofatty atherosclerotic plaque resulting in significant stenosis at the origin of the anterior division. The right prostatic artery appears to arise proximally from right obturator artery. Common and external iliac arteries are relatively spared from disease. On the left, mild atherosclerotic plaque throughout the common and external iliac artery without significant stenosis. The origin into the internal iliac artery is patent. Similar to the contralateral side, mixed calcified and fibrofatty atherosclerotic plaque results in significant stenosis at the origin of the anterior division. The left prostatic artery appears to arise from the gluteal pudendal trunk. Of note, it is not well opacified for an intermediate length segment. The left obturator artery is replaced to the inferior epigastric artery (corona mortis). Proximal Outflow: Bilateral common femoral and visualized portions of the superficial and profunda femoral arteries are patent without evidence of aneurysm, dissection, vasculitis or significant stenosis. Veins: No focal venous abnormality.  IVC filter in place. Review of the MIP images confirms the above findings. NON-VASCULAR  Lower chest: No acute abnormality. Atherosclerotic plaque present in the coronary arteries. Dependent atelectasis in both lower lobes. No suspicious mass or pulmonary nodule. Hepatobiliary: No focal liver abnormality is seen. No gallstones, gallbladder wall thickening, or biliary dilatation. Pancreas: Unremarkable. No pancreatic ductal dilatation or surrounding inflammatory changes. Spleen: Normal in size without focal abnormality. Adrenals/Urinary Tract: 1.3 cm left adrenal nodule appears grossly unchanged. Multiple circumscribed low-attenuation renal lesions remain unchanged in size and appearance consistent with simple cysts. There is a small exophytic enhancing lesion arising from the anterior aspect of the upper pole of the right kidney which measures 1 x 1 cm. 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 Moderate left hydroureteronephrosis. Mild right hydroureteronephrosis. Prostatomegaly. Additionally, high attenuation material is seen layering along the posterior wall of the bladder and likely partially obstructing the left ureteral orifice. A 1 cm bladder stone is also present dependently. Suprapubic catheter is in place. Brachytherapy seeds visualized within the prostate gland base. Stomach/Bowel: Stomach is within normal limits. Appendix appears normal. No evidence of bowel wall thickening, distention, or inflammatory changes. Colonic diverticular disease without CT evidence of active inflammation. Lymphatic: No suspicious lymphadenopathy. Reproductive: Marked prostatomegaly. Brachytherapy seeds visualized in the base of the prostate gland. Other: No abdominal wall hernia or abnormality. No abdominopelvic ascites. Musculoskeletal: No acute or significant osseous findings. Mild bilateral hip joint osteoarthritis. IMPRESSION: VASCULAR 1. Scattered atherosclerotic plaque affecting the origins and proximal aspects of the  anterior divisions of the iliac arteries bilaterally. 2. On the right, the prostatic artery appears to arise proximally from the obturator artery.  3. On the left, the prostatic artery appears to arise from the gluteal pudendal trunk. However, underlying atherosclerotic disease limits visualization in there may be significant stenosis of the gluteal prostatic trunk and proximal prostatic artery. 4. 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 5. 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 6.  Aortic Atherosclerosis (ICD10-I70.0). NON-VASCULAR 1. Small 1 cm enhancing lesion exophytic from the anterior upper pole of the right kidney demonstrates no significant interval change compared prior imaging from November of 2020. 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 Recommend continued attention on follow-up imaging. 2. Left greater than right bilateral hydroureteronephrosis likely due to obstruction at the ureteral orifices from either hematoma or bladder mass along the posterior bladder wall. 3. Irregular lobular high attenuation soft tissue layering posteriorly along the bladder wall may represent hematoma or a lobulated bladder wall mass. 4. 1 cm bladder stone. 5. 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 6. Colonic diverticular disease without CT evidence of active inflammation. 7. Additional ancillary findings as above. These results will be called to the ordering clinician or representative by the Radiologist Assistant, and communication documented in the PACS or [employer]. Signed, Criselda Peaches, MD, [facility] Radiology Electronically Signed   By: Jacqulynn Cadet M.D.   On: 01/14/2022 07:19     Medical Consultants:   None.   Subjective:    Nathan [name] tolerating his diet no complaints.  Objective:    Vitals:   01/14/22 0400 01/14/22 0500  01/14/22 0600 01/14/22 0700  BP:      Pulse: 79 75 73 76  Resp: 19 (!) 23 (!) 21 (!) 22  Temp: 99.9 F (37.7 C)     TempSrc: Axillary     SpO2: 95% 96% 97% 95%  Weight:      Height:       SpO2: 95 %   Intake/Output Summary (Last 24 hours) at 01/14/2022 0752 Last data filed at 01/14/2022 0700 Gross per 24 hour  Intake 290 ml  Output 1300 ml  Net -1010 ml    Filed Weights   01/11/22 1907  Weight: 88.5 kg    Exam: General exam: In no acute distress. Respiratory system: Good air movement and clear to auscultation. Cardiovascular system: S1 & S2 heard, RRR. No JVD. Gastrointestinal system: Abdomen is nondistended, soft and nontender.  Extremities: No pedal edema. Skin: No rashes, lesions or ulcers Psychiatry: Judgement and insight appear normal. Mood & affect appropriate.  Data Reviewed:    Labs: Basic Metabolic Panel: Recent Labs  Lab 01/11/22 2000 01/13/22 0737  NA 142 140  K 4.2 4.1  CL 111 110  CO2 27 25  GLUCOSE 125* 96  BUN 12 10  CREATININE 1.04 0.97  CALCIUM 8.6* 8.3*    GFR Estimated Creatinine Clearance: 70.9 mL/min (by C-G formula based on SCr of 0.97 mg/dL). Liver Function Tests: No results for input(s): "AST", "ALT", "ALKPHOS", "BILITOT", "PROT", "ALBUMIN" in the last 168 hours. No results for input(s): "LIPASE", "AMYLASE" in the last 168 hours. No results for input(s): "AMMONIA" in the last 168 hours. Coagulation profile No results for input(s): "INR", "PROTIME" in the last 168 hours. COVID-19 Labs  No results for input(s): "DDIMER", "FERRITIN", "LDH", "CRP" in the last 72 hours.  Lab Results  Component Value Date   SARSCOV2NAA NEGATIVE 05/12/2020   [place] NEGATIVE 02/22/2020    CBC: Recent Labs  Lab 01/11/22  2000 01/12/22 0626 01/12/22 1254 01/12/22 2222 01/13/22 0737 01/13/22 1313 01/14/22 0248  WBC 5.3 6.1  --  9.4 8.7  --  12.1*  NEUTROABS 3.5  --   --   --   --   --   --   HGB 4.9* 6.3* 7.5* 7.6* 7.4* 8.3* 7.5*   HCT 17.1* 21.2* 24.6* 25.3* 24.1* 27.2* 24.2*  MCV 90.5 83.8  --  81.6 81.1  --  80.7  PLT 215 207  --  217 199  --  185    Cardiac Enzymes: No results for input(s): "CKTOTAL", "CKMB", "CKMBINDEX", "TROPONINI" in the last 168 hours. BNP (last 3 results) No results for input(s): "PROBNP" in the last 8760 hours. CBG: Recent Labs  Lab 01/13/22 0831 01/13/22 1211 01/13/22 1528 01/13/22 2129 01/14/22 0745  GLUCAP 87 84 126* 113* 86    D-Dimer: No results for input(s): "DDIMER" in the last 72 hours. Hgb A1c: No results for input(s): "HGBA1C" in the last 72 hours. Lipid Profile: No results for input(s): "CHOL", "HDL", "LDLCALC", "TRIG", "CHOLHDL", "LDLDIRECT" in the last 72 hours. Thyroid function studies: No results for input(s): "TSH", "T4TOTAL", "T3FREE", "THYROIDAB" in the last 72 hours.  Invalid input(s): "FREET3" Anemia work up: No results for input(s): "VITAMINB12", "FOLATE", "FERRITIN", "TIBC", "IRON", "RETICCTPCT" in the last 72 hours. Sepsis Labs: Recent Labs  Lab 01/12/22 0626 01/12/22 2222 01/13/22 0737 01/14/22 0248  WBC 6.1 9.4 8.7 12.1*    Microbiology Recent Results (from the past 240 hour(s))  Urine Culture     Status: Abnormal   Collection Time: 01/11/22  7:05 PM   Specimen: Urine, Suprapubic  Result Value Ref Range Status   Specimen Description   Final    URINE, SUPRAPUBIC Performed at [facility] [address] [place], [place] [address]    Special Requests   Final    NONE Performed at [facility] [address], [place], [place] [address]    Culture (A)  Final    <10,000 COLONIES/mL INSIGNIFICANT GROWTH Performed at [facility] [address] [place], [place] [address]    Report Status 01/13/2022 FINAL  Final     Medications:    Chlorhexidine Gluconate Cloth  6 each Topical Daily   finasteride  5 mg Oral Daily   folic acid  1 mg Oral Daily   gabapentin  300 mg Oral TID   insulin  aspart  0-15 Units Subcutaneous TID WC   insulin aspart  0-5 Units Subcutaneous QHS   insulin aspart  4 Units Subcutaneous TID WC   lisinopril  40 mg Oral Daily   magnesium oxide  400 mg Oral Daily   metoprolol succinate  100 mg Oral Daily   vitamin B-12  1,000 mcg Oral Daily   Continuous Infusions:      LOS: 2 days   Charlynne Cousins  [facility]  01/14/2022, 7:52 AM

## 2022-01-14 NOTE — Plan of Care (Signed)
  Problem: Coping: Goal: Level of anxiety will decrease Outcome: Progressing   Problem: Pain Managment: Goal: General experience of comfort will improve Outcome: Progressing   Problem: Nutritional: Goal: Maintenance of adequate nutrition will improve Outcome: Progressing   Problem: Skin Integrity: Goal: Risk for impaired skin integrity will decrease Outcome: Progressing

## 2022-01-14 NOTE — Progress Notes (Signed)
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 UDS acontractile. >200gm prostate with large median.   2 - Moderate Risk Prostate Cancer - s/p external beam radiation 2021 for Grade 2 disease. PSA 0.14 2023  3 - Recurrent Gross Hematuria - long h/o prostate / bladder source bleeding after radiation for prostate cancer. On finasteride at baseline. Bled down to Hgb 4's 12/2021. IN midst of eval for prostate artery embolication. Firmagon given 7/15 as temporozing to help decrease prostate vacularity. ON coumadin at baselie for Afib, Does have IVC filter. CT this admission w/o large clots or upper tract lesions includin 7/15.   Today "Nathan [name]" is stable. Transfused yesterday but hgb stable c/w some ongoing blood loss. CTA yesterday w/o large bladder clots (some small clot noted), just very large trilobar hypertrophy.   Objective: Vital signs in last 24 hours: Temp:  [98 F (36.7 C)-99.9 F (37.7 C)] 98.8 F (37.1 C) (07/16 0800) Pulse Rate:  [71-85] 76 (07/16 0700) Resp:  [16-25] 22 (07/16 0700) BP: (118-177)/(70-82) 118/70 (07/16 0856) SpO2:  [95 %-100 %] 95 % (07/16 0700) Last BM Date :  (PTA)  Intake/Output from previous day: 07/15 0701 - 07/16 0700 In: 290 [Blood:290] Out: 1300 [Urine:1300] Intake/Output this shift: No intake/output data recorded.   NAD in ICU, very pleasant. Non-labored breathing on [place] O2 SNTND SPT in place (small bore) with grossly bloody urine. No bladder distension, no large clots Some blood per penis as well No c/c/e  BEDSIDE BLADDER IRRIGATION: Using aseptic technique bladder irrigated in 60cc aliquots x 516m with sterile water. Approx 100cc old clot removed, urine then light pink after. Catheter draining well before and after irrigation.   Lab Results:  Recent Labs    01/13/22 0737 01/13/22 1313 01/14/22 0248  WBC 8.7  --  12.1*  HGB 7.4* 8.3* 7.5*  HCT 24.1*  27.2* 24.2*  PLT 199  --  185   BMET Recent Labs    01/11/22 2000 01/13/22 0737  NA 142 140  K 4.2 4.1  CL 111 110  CO2 27 25  GLUCOSE 125* 96  BUN 12 10  CREATININE 1.04 0.97  CALCIUM 8.6* 8.3*   PT/INR No results for input(s): "LABPROT", "INR" in the last 72 hours. ABG No results for input(s): "PHART", "HCO3" in the last 72 hours.  Invalid input(s): "PCO2", "PO2"  Studies/Results: CT Angio Abd/Pel w/ and/or w/o  Result Date: 01/14/2022 CLINICAL DATA:  Chronic kidney disease, prostate embolization mapping evaluation. EXAM: CTA ABDOMEN AND PELVIS WITHOUT AND WITH CONTRAST TECHNIQUE: Multidetector CT imaging of the abdomen and pelvis was performed using the standard protocol during bolus administration of intravenous contrast. Multiplanar reconstructed images and MIPs were obtained and reviewed to evaluate the vascular anatomy. RADIATION DOSE REDUCTION: This exam was performed according to the departmental dose-optimization program which includes automated exposure control, adjustment of the mA and/or kV according to patient size and/or use of iterative reconstruction technique. CONTRAST:  1032mOMNIPAQUE IOHEXOL 350 MG/ML SOLN COMPARISON:  Prior CT scan of the abdomen and pelvis 05/27/2019 FINDINGS: VASCULAR Aorta: Normal caliber aorta without aneurysm, dissection, vasculitis or significant stenosis. Scattered atherosclerotic vascular calcifications. Celiac: Patent without evidence of aneurysm, dissection, vasculitis or significant stenosis. SMA: Patent without evidence of aneurysm, dissection, vasculitis or significant stenosis. Renals: 2 renal arteries are present bilaterally. On the right, the main renal artery demonstrates focal fibrofatty atherosclerotic plaque resulting in at least mild stenosis at the origin. There  is a smaller accessory artery providing supply to the upper pole. On the left, the 2 renal arteries are code dominant. Mild atherosclerotic plaque at the origins without  significant appearing stenosis. No dissection, aneurysm or evidence of fibromuscular dysplasia. IMA: Patent without evidence of aneurysm, dissection, vasculitis or significant stenosis. Inflow: Atherosclerotic plaque at the origin of the right internal iliac artery without significant stenosis. However, there is more significant atherosclerotic vascular disease at the bifurcation into the anterior and posterior divisions with long segment irregular stenosis of the superior gluteal artery, and bulky combined calcified and fibrofatty atherosclerotic plaque resulting in significant stenosis at the origin of the anterior division. The right prostatic artery appears to arise proximally from right obturator artery. Common and external iliac arteries are relatively spared from disease. On the left, mild atherosclerotic plaque throughout the common and external iliac artery without significant stenosis. The origin into the internal iliac artery is patent. Similar to the contralateral side, mixed calcified and fibrofatty atherosclerotic plaque results in significant stenosis at the origin of the anterior division. The left prostatic artery appears to arise from the gluteal pudendal trunk. Of note, it is not well opacified for an intermediate length segment. The left obturator artery is replaced to the inferior epigastric artery (corona mortis). Proximal Outflow: Bilateral common femoral and visualized portions of the superficial and profunda femoral arteries are patent without evidence of aneurysm, dissection, vasculitis or significant stenosis. Veins: No focal venous abnormality.  IVC filter in place. Review of the MIP images confirms the above findings. NON-VASCULAR Lower chest: No acute abnormality. Atherosclerotic plaque present in the coronary arteries. Dependent atelectasis in both lower lobes. No suspicious mass or pulmonary nodule. Hepatobiliary: No focal liver abnormality is seen. No gallstones, gallbladder wall  thickening, or biliary dilatation. Pancreas: Unremarkable. No pancreatic ductal dilatation or surrounding inflammatory changes. Spleen: Normal in size without focal abnormality. Adrenals/Urinary Tract: 1.3 cm left adrenal nodule appears grossly unchanged. Multiple circumscribed low-attenuation renal lesions remain unchanged in size and appearance consistent with simple cysts. There is a small exophytic enhancing lesion arising from the anterior aspect of the upper pole of the right kidney which measures 1 x 1 cm. 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 Moderate left hydroureteronephrosis. Mild right hydroureteronephrosis. Prostatomegaly. Additionally, high attenuation material is seen layering along the posterior wall of the bladder and likely partially obstructing the left ureteral orifice. A 1 cm bladder stone is also present dependently. Suprapubic catheter is in place. Brachytherapy seeds visualized within the prostate gland base. Stomach/Bowel: Stomach is within normal limits. Appendix appears normal. No evidence of bowel wall thickening, distention, or inflammatory changes. Colonic diverticular disease without CT evidence of active inflammation. Lymphatic: No suspicious lymphadenopathy. Reproductive: Marked prostatomegaly. Brachytherapy seeds visualized in the base of the prostate gland. Other: No abdominal wall hernia or abnormality. No abdominopelvic ascites. Musculoskeletal: No acute or significant osseous findings. Mild bilateral hip joint osteoarthritis. IMPRESSION: VASCULAR 1. Scattered atherosclerotic plaque affecting the origins and proximal aspects of the anterior divisions of the iliac arteries bilaterally. 2. On the right, the prostatic artery appears to arise proximally from the obturator artery. 3. On the left, the prostatic artery appears to arise from the gluteal pudendal trunk. However, underlying  atherosclerotic disease limits visualization in there may be significant stenosis of the gluteal prostatic trunk and proximal prostatic artery. 4. 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 5. Mild  stenosis of the origin of the right main renal artery. 6.  Aortic Atherosclerosis (ICD10-I70.0). NON-VASCULAR 1. Small 1 cm enhancing lesion exophytic from the anterior upper pole of the right kidney demonstrates no significant interval change compared prior imaging from November of 2020. 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 Recommend continued attention on follow-up imaging. 2. Left greater than right bilateral hydroureteronephrosis likely due to obstruction at the ureteral orifices from either hematoma or bladder mass along the posterior bladder wall. 3. Irregular lobular high attenuation soft tissue layering posteriorly along the bladder wall may represent hematoma or a lobulated bladder wall mass. 4. 1 cm bladder stone. 5. 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 6. Colonic diverticular disease without CT evidence of active inflammation. 7. Additional ancillary findings as above. These results will be called to the ordering clinician or representative by the Radiologist Assistant, and communication documented in the PACS or [employer]. Signed, Criselda Peaches, MD, [facility] Electronically Signed   By: Jacqulynn Cadet M.D.   On: 01/14/2022 07:19    Anti-infectives: Anti-infectives (From admission, onward)    Start     Dose/Rate Route Frequency Ordered Stop   01/12/22 0100  cefTRIAXone (ROCEPHIN) 1 g in sodium chloride 0.9 % 100 mL IVPB  Status:  Discontinued        1 g 200 mL/hr over 30 Minutes Intravenous Every 24 hours 01/12/22 0050 01/13/22 1329       Assessment/Plan:  1 - Urinary retention / Massive Prostate / Poorly Contractile Bladder:- continue  SPT.  2 - Moderate Risk Prostate Cancer - excellent biochemical control  3 - Recurrent Gross Hematuria - unfortunate sequella of radiation in massive prostate. Firmagon 240 given and will hopefully show some clinical benefit over next 72 hours. Agree with path to PAE.  Will also consdier placemetn of large urethral catheter on traction and two catheter irrigation if sig clots develop or refracotry Hgb drop.   Greatly appreciate hospitalist and IR team comanagmetn.    Alexis Frock 01/14/2022

## 2022-01-15 DIAGNOSIS — D649 Anemia, unspecified: Secondary | ICD-10-CM | POA: Diagnosis not present

## 2022-01-15 DIAGNOSIS — E119 Type 2 diabetes mellitus without complications: Secondary | ICD-10-CM | POA: Diagnosis not present

## 2022-01-15 DIAGNOSIS — R310 Gross hematuria: Secondary | ICD-10-CM | POA: Diagnosis not present

## 2022-01-15 DIAGNOSIS — I4891 Unspecified atrial fibrillation: Secondary | ICD-10-CM | POA: Diagnosis not present

## 2022-01-15 LAB — GLUCOSE, CAPILLARY
Glucose-Capillary: 92 mg/dL (ref 70–99)
Glucose-Capillary: 113 mg/dL — ABNORMAL HIGH (ref 70–99)
Glucose-Capillary: 116 mg/dL — ABNORMAL HIGH (ref 70–99)
Glucose-Capillary: 104 mg/dL — ABNORMAL HIGH (ref 70–99)

## 2022-01-15 LAB — TYPE AND SCREEN
ABO/RH(D): B POS
Antibody Screen: NEGATIVE
Unit division: 0
Unit division: 0
Unit division: 0
Unit division: 0
Unit division: 0

## 2022-01-15 LAB — BPAM RBC
Blood Product Expiration Date: 202307272359
Blood Product Expiration Date: 202307272359
Blood Product Expiration Date: 202307292359
Blood Product Expiration Date: 202307292359
Blood Product Expiration Date: 202308072359
ISSUE DATE / TIME: 202307132314
ISSUE DATE / TIME: 202307140206
ISSUE DATE / TIME: 202307140821
ISSUE DATE / TIME: 202307150927
ISSUE DATE / TIME: 202307160954
Unit Type and Rh: 7300
Unit Type and Rh: 7300
Unit Type and Rh: 7300
Unit Type and Rh: 7300
Unit Type and Rh: 7300

## 2022-01-15 NOTE — Progress Notes (Signed)
[facility] PROGRESS NOTE    Progress Note  Lenorris Karger  OZH:[id number] DOB: [birth year] DOA: 01/11/2022 PCP: [name], MD     Brief Narrative:   Nathan [name] is an 76 y.o. male past medical history significant for prostate cancer status post radioactive seed implant with suprapubic catheter and chronic hematuria, chronic atrial fibrillation, diabetes mellitus type 2 recent history of DVT status post IVC filter who comes in for gross hematuria patient is a limited historian and most of the history was obtained from the daughter, went to his PCP his hemoglobin was 6 was sent to the ED in the ED, it was 5.   Assessment/Plan:   Symptomatic anemia secondary to gross hematuria possibly due to complicated UTI in the setting of suprapubic catheter Urology reevaluated the patient. Patient is tentatively plan for 01/17/2022 pending insurance approval for PEA. Continue Proscar. Continues to have gross hematuria further management per urology. Status post 4 units of packed red blood cells when this morning is 9.2.  History of DVT: In setting of prostate cancer underwent IVC filter on 12/10/2021. Not on anticoagulation.  Diabetes mellitus type 2: Diet control continue check sliding scale.  Essential hypertension: Continue metoprolol blood pressure is stable. Continue to hold ACE inhibitor.  Atrial fibrillation ([place]) Rate control resume his metoprolol. Not a candidate for anticoagulation due to gross hematuria required multiple units of packed red blood cells will need to be evaluated as an outpatient.   DVT prophylaxis: scd Family Communication:daughter Status is: Observation The patient remains OBS appropriate and will d/c before 2 midnights.  Gross hematuria.    Code Status:     Code Status Orders  (From admission, onward)           Start     Ordered   01/12/22 0049  Full code  Continuous        01/12/22 0049           Code Status History      Date Active Date Inactive Code Status Order ID Comments User Context   12/09/2021 1710 12/11/2021 2253 Full Code [id number]  Jonnie Finner, DO Inpatient   02/22/2020 1822 02/28/2020 1641 Full Code [id number]  Jacky Kindle, MD ED      Advance Directive Documentation    Flowsheet Row Most Recent Value  Type of Advance Directive Healthcare Power of Attorney  Pre-existing out of facility DNR order (yellow form or pink MOST form) --  "MOST" Form in Place? --         IV Access:   Peripheral IV   Procedures and diagnostic studies:   CT Angio Abd/Pel w/ and/or w/o  Result Date: 01/14/2022 CLINICAL DATA:  Chronic kidney disease, prostate embolization mapping evaluation. EXAM: CTA ABDOMEN AND PELVIS WITHOUT AND WITH CONTRAST TECHNIQUE: Multidetector CT imaging of the abdomen and pelvis was performed using the standard protocol during bolus administration of intravenous contrast. Multiplanar reconstructed images and MIPs were obtained and reviewed to evaluate the vascular anatomy. RADIATION DOSE REDUCTION: This exam was performed according to the departmental dose-optimization program which includes automated exposure control, adjustment of the mA and/or kV according to patient size and/or use of iterative reconstruction technique. CONTRAST:  181m OMNIPAQUE IOHEXOL 350 MG/ML SOLN COMPARISON:  Prior CT scan of the abdomen and pelvis 05/27/2019 FINDINGS: VASCULAR Aorta: Normal caliber aorta without aneurysm, dissection, vasculitis or significant stenosis. Scattered atherosclerotic vascular calcifications. Celiac: Patent without evidence of aneurysm, dissection, vasculitis or significant stenosis. SMA: Patent without evidence of aneurysm, dissection,  vasculitis or significant stenosis. Renals: 2 renal arteries are present bilaterally. On the right, the main renal artery demonstrates focal fibrofatty atherosclerotic plaque resulting in at least mild stenosis at the origin. There is a smaller accessory  artery providing supply to the upper pole. On the left, the 2 renal arteries are code dominant. Mild atherosclerotic plaque at the origins without significant appearing stenosis. No dissection, aneurysm or evidence of fibromuscular dysplasia. IMA: Patent without evidence of aneurysm, dissection, vasculitis or significant stenosis. Inflow: Atherosclerotic plaque at the origin of the right internal iliac artery without significant stenosis. However, there is more significant atherosclerotic vascular disease at the bifurcation into the anterior and posterior divisions with long segment irregular stenosis of the superior gluteal artery, and bulky combined calcified and fibrofatty atherosclerotic plaque resulting in significant stenosis at the origin of the anterior division. The right prostatic artery appears to arise proximally from right obturator artery. Common and external iliac arteries are relatively spared from disease. On the left, mild atherosclerotic plaque throughout the common and external iliac artery without significant stenosis. The origin into the internal iliac artery is patent. Similar to the contralateral side, mixed calcified and fibrofatty atherosclerotic plaque results in significant stenosis at the origin of the anterior division. The left prostatic artery appears to arise from the gluteal pudendal trunk. Of note, it is not well opacified for an intermediate length segment. The left obturator artery is replaced to the inferior epigastric artery (corona mortis). Proximal Outflow: Bilateral common femoral and visualized portions of the superficial and profunda femoral arteries are patent without evidence of aneurysm, dissection, vasculitis or significant stenosis. Veins: No focal venous abnormality.  IVC filter in place. Review of the MIP images confirms the above findings. NON-VASCULAR Lower chest: No acute abnormality. Atherosclerotic plaque present in the coronary arteries. Dependent atelectasis  in both lower lobes. No suspicious mass or pulmonary nodule. Hepatobiliary: No focal liver abnormality is seen. No gallstones, gallbladder wall thickening, or biliary dilatation. Pancreas: Unremarkable. No pancreatic ductal dilatation or surrounding inflammatory changes. Spleen: Normal in size without focal abnormality. Adrenals/Urinary Tract: 1.3 cm left adrenal nodule appears grossly unchanged. Multiple circumscribed low-attenuation renal lesions remain unchanged in size and appearance consistent with simple cysts. There is a small exophytic enhancing lesion arising from the anterior aspect of the upper pole of the right kidney which measures 1 x 1 cm. 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 Moderate left hydroureteronephrosis. Mild right hydroureteronephrosis. Prostatomegaly. Additionally, high attenuation material is seen layering along the posterior wall of the bladder and likely partially obstructing the left ureteral orifice. A 1 cm bladder stone is also present dependently. Suprapubic catheter is in place. Brachytherapy seeds visualized within the prostate gland base. Stomach/Bowel: Stomach is within normal limits. Appendix appears normal. No evidence of bowel wall thickening, distention, or inflammatory changes. Colonic diverticular disease without CT evidence of active inflammation. Lymphatic: No suspicious lymphadenopathy. Reproductive: Marked prostatomegaly. Brachytherapy seeds visualized in the base of the prostate gland. Other: No abdominal wall hernia or abnormality. No abdominopelvic ascites. Musculoskeletal: No acute or significant osseous findings. Mild bilateral hip joint osteoarthritis. IMPRESSION: VASCULAR 1. Scattered atherosclerotic plaque affecting the origins and proximal aspects of the anterior divisions of the iliac arteries bilaterally. 2. On the right, the prostatic artery appears to arise  proximally from the obturator artery. 3. On the left, the prostatic artery appears to arise from the gluteal pudendal trunk. However, underlying atherosclerotic  disease limits visualization in there may be significant stenosis of the gluteal prostatic trunk and proximal prostatic artery. 4. 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 5. 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 6.  Aortic Atherosclerosis (ICD10-I70.0). NON-VASCULAR 1. Small 1 cm enhancing lesion exophytic from the anterior upper pole of the right kidney demonstrates no significant interval change compared prior imaging from November of 2020. 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 Recommend continued attention on follow-up imaging. 2. Left greater than right bilateral hydroureteronephrosis likely due to obstruction at the ureteral orifices from either hematoma or bladder mass along the posterior bladder wall. 3. Irregular lobular high attenuation soft tissue layering posteriorly along the bladder wall may represent hematoma or a lobulated bladder wall mass. 4. 1 cm bladder stone. 5. 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 6. Colonic diverticular disease without CT evidence of active inflammation. 7. Additional ancillary findings as above. These results will be called to the ordering clinician or representative by the Radiologist Assistant, and communication documented in the PACS or [employer]. Signed, Criselda Peaches, MD, [facility] Radiology Electronically Signed   By: Jacqulynn Cadet M.D.   On: 01/14/2022 07:19     Medical Consultants:   None.   Subjective:    [name] sleepy this morning no complaints.  Objective:    Vitals:   01/15/22 0100 01/15/22 0200 01/15/22 0300 01/15/22 0330  BP:      Pulse: 69 67 68   Resp: (!) 21 (!) 22 (!) 28   Temp:    97.6 F (36.4 C)   TempSrc:    Axillary  SpO2: 96% 96% 96%   Weight:      Height:       SpO2: 96 %   Intake/Output Summary (Last 24 hours) at 01/15/2022 0643 Last data filed at 01/14/2022 1800 Gross per 24 hour  Intake 386.33 ml  Output 2100 ml  Net -1713.67 ml    Filed Weights   01/11/22 1907  Weight: 88.5 kg    Exam: General exam: In no acute distress. Respiratory system: Good air movement and clear to auscultation. Cardiovascular system: S1 & S2 heard, RRR. No JVD. Gastrointestinal system: Abdomen is nondistended, soft and nontender.  Extremities: No pedal edema. Skin: No rashes, lesions or ulcers Psychiatry: Judgement and insight appear normal. Mood & affect appropriate.  Data Reviewed:    Labs: Basic Metabolic Panel: Recent Labs  Lab 01/11/22 2000 01/13/22 0737  NA 142 140  K 4.2 4.1  CL 111 110  CO2 27 25  GLUCOSE 125* 96  BUN 12 10  CREATININE 1.04 0.97  CALCIUM 8.6* 8.3*    GFR Estimated Creatinine Clearance: 70.9 mL/min (by C-G formula based on SCr of 0.97 mg/dL). Liver Function Tests: No results for input(s): "AST", "ALT", "ALKPHOS", "BILITOT", "PROT", "ALBUMIN" in the last 168 hours. No results for input(s): "LIPASE", "AMYLASE" in the last 168 hours. No results for input(s): "AMMONIA" in the last 168 hours. Coagulation profile No results for input(s): "INR", "PROTIME" in the last 168 hours. COVID-19 Labs  No results for input(s): "DDIMER", "FERRITIN", "LDH", "CRP" in the last 72 hours.  Lab Results  Component Value Date   SARSCOV2NAA NEGATIVE 05/12/2020   [place] NEGATIVE 02/22/2020    CBC: Recent Labs  Lab 01/11/22 2000 01/12/22 0626 01/12/22 1254 01/12/22 2222 01/13/22 0737 01/13/22 1313 01/14/22 0248 01/14/22 1355  WBC 5.3  6.1  --  9.4 8.7  --  12.1* 12.4*  NEUTROABS 3.5  --   --   --   --   --   --   --   HGB 4.9* 6.3*   < > 7.6* 7.4* 8.3* 7.5* 9.2*  HCT 17.1* 21.2*   < > 25.3* 24.1* 27.2* 24.2* 30.3*  MCV 90.5 83.8  --  81.6 81.1  --   80.7 83.0  PLT 215 207  --  217 199  --  185 215   < > = values in this interval not displayed.    Cardiac Enzymes: No results for input(s): "CKTOTAL", "CKMB", "CKMBINDEX", "TROPONINI" in the last 168 hours. BNP (last 3 results) No results for input(s): "PROBNP" in the last 8760 hours. CBG: Recent Labs  Lab 01/13/22 2129 01/14/22 0745 01/14/22 1155 01/14/22 1634 01/14/22 2206  GLUCAP 113* 86 101* 108* 127*    D-Dimer: No results for input(s): "DDIMER" in the last 72 hours. Hgb A1c: No results for input(s): "HGBA1C" in the last 72 hours. Lipid Profile: No results for input(s): "CHOL", "HDL", "LDLCALC", "TRIG", "CHOLHDL", "LDLDIRECT" in the last 72 hours. Thyroid function studies: No results for input(s): "TSH", "T4TOTAL", "T3FREE", "THYROIDAB" in the last 72 hours.  Invalid input(s): "FREET3" Anemia work up: No results for input(s): "VITAMINB12", "FOLATE", "FERRITIN", "TIBC", "IRON", "RETICCTPCT" in the last 72 hours. Sepsis Labs: Recent Labs  Lab 01/12/22 2222 01/13/22 0737 01/14/22 0248 01/14/22 1355  WBC 9.4 8.7 12.1* 12.4*    Microbiology Recent Results (from the past 240 hour(s))  Urine Culture     Status: Abnormal   Collection Time: 01/11/22  7:05 PM   Specimen: Urine, Suprapubic  Result Value Ref Range Status   Specimen Description   Final    URINE, SUPRAPUBIC Performed at [facility] [address] [place], [place] [address]    Special Requests   Final    NONE Performed at [facility], [place] [address] [place], [place] [address]    Culture (A)  Final    <10,000 COLONIES/mL INSIGNIFICANT GROWTH Performed at [facility] [address], [place], [place] [address]    Report Status 01/13/2022 FINAL  Final     Medications:    Chlorhexidine Gluconate Cloth  6 each Topical Daily   finasteride  5 mg Oral Daily   folic acid  1 mg Oral Daily   gabapentin  300 mg Oral TID   insulin aspart  0-15 Units  Subcutaneous TID WC   insulin aspart  0-5 Units Subcutaneous QHS   insulin aspart  4 Units Subcutaneous TID WC   lisinopril  40 mg Oral Daily   magnesium oxide  400 mg Oral Daily   metoprolol succinate  100 mg Oral Daily   vitamin B-12  1,000 mcg Oral Daily   Continuous Infusions:      LOS: 3 days   Charlynne Cousins  [facility]  01/15/2022, 6:43 AM

## 2022-01-15 NOTE — Progress Notes (Signed)
PT Cancellation Note  Patient Details Name: Nathan [name] MRN: [id number] DOB: [birth year]   Cancelled Treatment:    Reason Eval/Treat Not Completed: Fatigue/lethargy limiting ability to participate Patient had been  up with nursing and back in bed when checked on. Will chack back in AM.  [place] Office [phone] Weekend [phone]   Claretha Cooper 01/15/2022, 4:29 PM

## 2022-01-16 DIAGNOSIS — I4891 Unspecified atrial fibrillation: Secondary | ICD-10-CM | POA: Diagnosis not present

## 2022-01-16 DIAGNOSIS — R310 Gross hematuria: Secondary | ICD-10-CM | POA: Diagnosis not present

## 2022-01-16 DIAGNOSIS — E119 Type 2 diabetes mellitus without complications: Secondary | ICD-10-CM | POA: Diagnosis not present

## 2022-01-16 DIAGNOSIS — D649 Anemia, unspecified: Secondary | ICD-10-CM | POA: Diagnosis not present

## 2022-01-16 LAB — CBC
HCT: 25.5 % — ABNORMAL LOW (ref 39.0–52.0)
Hemoglobin: 7.9 g/dL — ABNORMAL LOW (ref 13.0–17.0)
MCH: 26.1 pg (ref 26.0–34.0)
MCHC: 31 g/dL (ref 30.0–36.0)
MCV: 84.2 fL (ref 80.0–100.0)
Platelets: 202 10*3/uL (ref 150–400)
RBC: 3.03 MIL/uL — ABNORMAL LOW (ref 4.22–5.81)
RDW: 17.9 % — ABNORMAL HIGH (ref 11.5–15.5)
WBC: 7.5 10*3/uL (ref 4.0–10.5)
nRBC: 0 % (ref 0.0–0.2)

## 2022-01-16 LAB — SURGICAL PCR SCREEN
MRSA, PCR: NEGATIVE
Staphylococcus aureus: NEGATIVE

## 2022-01-16 LAB — GLUCOSE, CAPILLARY
Glucose-Capillary: 95 mg/dL (ref 70–99)
Glucose-Capillary: 107 mg/dL — ABNORMAL HIGH (ref 70–99)
Glucose-Capillary: 140 mg/dL — ABNORMAL HIGH (ref 70–99)
Glucose-Capillary: 102 mg/dL — ABNORMAL HIGH (ref 70–99)

## 2022-01-16 NOTE — Progress Notes (Addendum)
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 UDS acontractile. >200gm prostate with large median.   2 - Moderate Risk Prostate Cancer - s/p external beam radiation 2021 for Grade 2 disease. PSA 0.14 2023  3 - Recurrent Gross Hematuria - long h/o prostate / bladder source bleeding after radiation for prostate cancer. On finasteride at baseline. Bled down to Hgb 4's 12/2021. Anticipating PAE later this week, although would want to do cystoscopy prior to ensure no bleeding source from the bladder  Today "Nathan [name]" is stable. No transfusion yesterday but slight hemoglobin drop today 7.9 from 9.1. Catheter flushed at bedside with return of small clots, now draining clear pink.  Objective: Vital signs in last 24 hours: Temp:  [98.4 F (36.9 C)-99.4 F (37.4 C)] 98.4 F (36.9 C) (07/18 0600) Pulse Rate:  [66-81] 81 (07/18 0600) Resp:  [10-21] 16 (07/18 0600) BP: (108-155)/(65-84) 108/72 (07/18 0600) SpO2:  [96 %-100 %] 98 % (07/18 0600) Last BM Date : 01/15/22  Intake/Output from previous day: 07/17 0701 - 07/18 0700 In: -  Out: 2865 [Urine:2865] Intake/Output this shift: No intake/output data recorded.   NAD in ICU, very pleasant. Non-labored breathing on [place] O2 SNTND SPT in place (small bore) with grossly bloody urine. No bladder distension, no large clots Some blood per penis as well No c/c/e  BEDSIDE BLADDER IRRIGATION: Using aseptic technique bladder irrigated in 60cc aliquots x 530m with sterile saline. Approx 50cc old clot removed, urine then light pink after. Catheter draining well before and after irrigation.   Lab Results:  Recent Labs    01/14/22 1355 01/16/22 0457  WBC 12.4* 7.5  HGB 9.2* 7.9*  HCT 30.3* 25.5*  PLT 215 202    BMET Recent Labs    01/13/22 0737  NA 140  K 4.1  CL 110  CO2 25  GLUCOSE 96  BUN 10  CREATININE 0.97  CALCIUM 8.3*    PT/INR No results  for input(s): "LABPROT", "INR" in the last 72 hours. ABG No results for input(s): "PHART", "HCO3" in the last 72 hours.  Invalid input(s): "PCO2", "PO2"  Studies/Results: No results found.  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  2 - Moderate Risk Prostate Cancer - excellent biochemical control  3 - Recurrent Gross Hematuria - unfortunate sequella of radiation in massive prostate. Planning for PAE but will plan for cystoscopy tomorrow prior to evaluate bladder lumen. Please make sure patient is NPO at midnight.  Greatly appreciate hospitalist and IR team comanagmetn.    KFlorentina Addison7/18/2023  Attending attestation: Patient was seen and examined on rounds this afternoon.  I discussed the patient with Dr. NGaren Lahand agree with his assessment and plan.  His urine remains quite red in the tube but thin. Catheter was irrigated earlier and no clots.  I discussed with the patient and called his daughter TCarolee Rotaand went over the nature risk and benefits of cystoscopy with possible TURP possible TURBT possible cystolitholopaxy in the AM. I want to  make as certain as we can there is no other obvious sign of bleeding before we do something like PAE.  We went over again the nature risk benefits and alternatives to PAE and the real risk of pain or necrosis or fistula after PAE given that he had prior radiation.  If it does not appear that the prostate is the primary source, we will change our plan and management. All questions answered. They elect to proceed.

## 2022-01-16 NOTE — Progress Notes (Addendum)
[facility] PROGRESS NOTE    Progress Note  Nathan [name]  VQQ:[id number] DOB: [birth year] DOA: 01/11/2022 PCP: [name], MD     Brief Narrative:   Nathan [name] is an 76 y.o. male past medical history significant for prostate cancer status post radioactive seed implant with suprapubic catheter and chronic hematuria, chronic atrial fibrillation, diabetes mellitus type 2 recent history of DVT status post IVC filter who comes in for gross hematuria patient is a limited historian and most of the history was obtained from the daughter, went to his PCP his hemoglobin was 6 was sent to the ED in the ED, it was 5.   Assessment/Plan:   Acute blood loss anemia/Symptomatic anemia secondary to gross hematuria possibly due to unfortunate sequel of radiation to the prostate Urology reevaluated the patient. Patient is tentatively plan for 01/17/2022 pending insurance approval for PEA. Urology will plan for cystoscopy on 01/17/2022. Continues to have gross hematuria further management per urology. Status post 4 units of packed red blood cells his hemoglobin this morning 7.9. Check a CBC tomorrow morning. Uti has been rule out.  History of DVT: In setting of prostate cancer underwent IVC filter on 12/10/2021. Not on anticoagulation.  Diabetes mellitus type 2: Diet control continue check sliding scale.  Essential hypertension: Continue metoprolol blood pressure is stable. Continue to hold ACE inhibitor.  Atrial fibrillation ([place]) Rate control resume his metoprolol. Not a candidate for anticoagulation due to gross hematuria required multiple units of packed red blood cells will need to be evaluated as an outpatient.   DVT prophylaxis: scd Family Communication:daughter Status is: Observation The patient remains OBS appropriate and will d/c before 2 midnights.  Gross hematuria.    Code Status:     Code Status Orders  (From admission, onward)           Start      Ordered   01/12/22 0049  Full code  Continuous        01/12/22 0049           Code Status History     Date Active Date Inactive Code Status Order ID Comments User Context   12/09/2021 1710 12/11/2021 2253 Full Code [id number]  Jonnie Finner, DO Inpatient   02/22/2020 1822 02/28/2020 1641 Full Code [id number]  Jacky Kindle, MD ED      Advance Directive Documentation    Flowsheet Row Most Recent Value  Type of Advance Directive Healthcare Power of Attorney  Pre-existing out of facility DNR order (yellow form or pink MOST form) --  "MOST" Form in Place? --         IV Access:   Peripheral IV   Procedures and diagnostic studies:   No results found.   Medical Consultants:   None.   Subjective:    Nathan [name] no complaints this morning.  Objective:    Vitals:   01/15/22 2137 01/16/22 0218 01/16/22 0600 01/16/22 0902  BP: 138/82 108/68 108/72 101/68  Pulse: 72 72 81 81  Resp: '18 16 16 18  '$ Temp: 98.6 F (37 C) 98.4 F (36.9 C) 98.4 F (36.9 C) 98.5 F (36.9 C)  TempSrc: Oral Oral Oral Oral  SpO2: 99% 100% 98% 98%  Weight:      Height:       SpO2: 98 %   Intake/Output Summary (Last 24 hours) at 01/16/2022 1013 Last data filed at 01/16/2022 1000 Gross per 24 hour  Intake --  Output 2565 ml  Net -2565 ml  Filed Weights   01/11/22 1907  Weight: 88.5 kg    Exam: General exam: In no acute distress. Respiratory system: Good air movement and clear to auscultation. Cardiovascular system: S1 & S2 heard, RRR. No JVD. Gastrointestinal system: Abdomen is nondistended, soft and nontender.  Extremities: No pedal edema. Skin: No rashes, lesions or ulcers Psychiatry: Judgement and insight appear normal. Mood & affect appropriate.  Data Reviewed:    Labs: Basic Metabolic Panel: Recent Labs  Lab 01/11/22 2000 01/13/22 0737  NA 142 140  K 4.2 4.1  CL 111 110  CO2 27 25  GLUCOSE 125* 96  BUN 12 10  CREATININE 1.04 0.97  CALCIUM 8.6* 8.3*     GFR Estimated Creatinine Clearance: 70.9 mL/min (by C-G formula based on SCr of 0.97 mg/dL). Liver Function Tests: No results for input(s): "AST", "ALT", "ALKPHOS", "BILITOT", "PROT", "ALBUMIN" in the last 168 hours. No results for input(s): "LIPASE", "AMYLASE" in the last 168 hours. No results for input(s): "AMMONIA" in the last 168 hours. Coagulation profile No results for input(s): "INR", "PROTIME" in the last 168 hours. COVID-19 Labs  No results for input(s): "DDIMER", "FERRITIN", "LDH", "CRP" in the last 72 hours.  Lab Results  Component Value Date   SARSCOV2NAA NEGATIVE 05/12/2020   [place] NEGATIVE 02/22/2020    CBC: Recent Labs  Lab 01/11/22 2000 01/12/22 0626 01/12/22 2222 01/13/22 0737 01/13/22 1313 01/14/22 0248 01/14/22 1355 01/16/22 0457  WBC 5.3   < > 9.4 8.7  --  12.1* 12.4* 7.5  NEUTROABS 3.5  --   --   --   --   --   --   --   HGB 4.9*   < > 7.6* 7.4* 8.3* 7.5* 9.2* 7.9*  HCT 17.1*   < > 25.3* 24.1* 27.2* 24.2* 30.3* 25.5*  MCV 90.5   < > 81.6 81.1  --  80.7 83.0 84.2  PLT 215   < > 217 199  --  185 215 202   < > = values in this interval not displayed.    Cardiac Enzymes: No results for input(s): "CKTOTAL", "CKMB", "CKMBINDEX", "TROPONINI" in the last 168 hours. BNP (last 3 results) No results for input(s): "PROBNP" in the last 8760 hours. CBG: Recent Labs  Lab 01/15/22 0746 01/15/22 1126 01/15/22 1532 01/15/22 2105 01/16/22 0733  GLUCAP 92 113* 116* 104* 95    D-Dimer: No results for input(s): "DDIMER" in the last 72 hours. Hgb A1c: No results for input(s): "HGBA1C" in the last 72 hours. Lipid Profile: No results for input(s): "CHOL", "HDL", "LDLCALC", "TRIG", "CHOLHDL", "LDLDIRECT" in the last 72 hours. Thyroid function studies: No results for input(s): "TSH", "T4TOTAL", "T3FREE", "THYROIDAB" in the last 72 hours.  Invalid input(s): "FREET3" Anemia work up: No results for input(s): "VITAMINB12", "FOLATE", "FERRITIN",  "TIBC", "IRON", "RETICCTPCT" in the last 72 hours. Sepsis Labs: Recent Labs  Lab 01/13/22 0737 01/14/22 0248 01/14/22 1355 01/16/22 0457  WBC 8.7 12.1* 12.4* 7.5    Microbiology Recent Results (from the past 240 hour(s))  Urine Culture     Status: Abnormal   Collection Time: 01/11/22  7:05 PM   Specimen: Urine, Suprapubic  Result Value Ref Range Status   Specimen Description   Final    URINE, SUPRAPUBIC Performed at [facility] [address] [place], [place] [address]    Special Requests   Final    NONE Performed at [facility], [place] [address] [place], [place] [address]    Culture (  A)  Final    <10,000 COLONIES/mL INSIGNIFICANT GROWTH Performed at [facility] [address] [place], [place] [address]    Report Status 01/13/2022 FINAL  Final     Medications:    Chlorhexidine Gluconate Cloth  6 each Topical Daily   finasteride  5 mg Oral Daily   folic acid  1 mg Oral Daily   gabapentin  300 mg Oral TID   insulin aspart  0-15 Units Subcutaneous TID WC   insulin aspart  0-5 Units Subcutaneous QHS   insulin aspart  4 Units Subcutaneous TID WC   lisinopril  40 mg Oral Daily   magnesium oxide  400 mg Oral Daily   metoprolol succinate  100 mg Oral Daily   vitamin B-12  1,000 mcg Oral Daily   Continuous Infusions:      LOS: 4 days   Charlynne Cousins  [facility]  01/16/2022, 10:13 AM

## 2022-01-16 NOTE — Progress Notes (Signed)
Mobility Specialist - Progress Note    01/16/22 0902  Therapy Vitals  Temp 98.5 F (36.9 C)  Temp Source Oral  Pulse Rate 81  Resp 18  BP 101/68  Patient Position (if appropriate) Lying  Oxygen Therapy  SpO2 98 %  O2 Device Room Air  Mobility  Activity Ambulated with assistance to bathroom  Level of Assistance Contact guard assist, steadying assist  Assistive Device Front wheel walker  Distance Ambulated (ft) 15 ft  Activity Response Tolerated well  $Mobility charge 1 Mobility   Pt requested to use bathroom upon arrival. Used RW and required verbal cues to steady pt into bathroom, had BM. Once done, pt stated feeling exhausted and requested to return to bed. Pt left in bed with call light at side and all necessities in reach.   Time in: 0919 Time out: [place] Phone: [phone] 01/16/22, 1:11 PM

## 2022-01-16 NOTE — Care Management Important Message (Signed)
Important Message  Patient Details IM Letter placed in Patients room. Name: Nathan [name] MRN: [id number] Date of Birth: Jun 30, 1946   Medicare Important Message Given:  Yes     Kerin Salen 01/16/2022, 1:56 PM

## 2022-01-16 NOTE — Progress Notes (Signed)
Transition of Care [facility]) Screening Note  Patient Details  Name: Nathan [name] Date of Birth: Dec 06, 1945  Transition of Care [facility]) CM/SW Contact:    Sherie Don, LCSW Phone Number: 01/16/2022, 2:15 PM  Transition of Care Department [facility]) has reviewed patient and no TOC needs have been identified at this time. We will continue to monitor patient advancement through interdisciplinary progression rounds. If new patient transition needs arise, please place a TOC consult.

## 2022-01-16 NOTE — Evaluation (Signed)
Physical Therapy Evaluation Patient Details Name: Nathan [name] MRN: [id number] DOB: October 06, 1945 Today's Date: 01/16/2022  History of Present Illness  76 y.o. male past medical history significant for prostate cancer status post radioactive seed implant with suprapubic catheter and chronic hematuria, chronic atrial fibrillation, diabetes mellitus type 2, recent history of DVT status post IVC filter on 12/10/21 who comes in for gross hematuria and admitted for Acute blood loss anemia/Symptomatic anemia secondary to gross hematuria possibly due to unfortunate sequel of radiation to the prostate.  Clinical Impression  Pt admitted with above diagnosis.  Pt currently with functional limitations due to the deficits listed below (see PT Problem List). Pt will benefit from skilled PT to increase their independence and safety with mobility to allow discharge to the venue listed below.  Pt states he would be able to move if "they would just cure me."  Pt reports he has surgery pending tomorrow and surprised when therapist requested he mobilize.  Pt encouraged to mobilize and discussed benefits.  Pt plans to d/c home with his daughter.       Recommendations for follow up therapy are one component of a multi-disciplinary discharge planning process, led by the attending physician.  Recommendations may be updated based on patient status, additional functional criteria and insurance authorization.  Follow Up Recommendations No PT follow up      Assistance Recommended at Discharge PRN  Patient can return home with the following  A little help with walking and/or transfers;Help with stairs or ramp for entrance    Equipment Recommendations None recommended by PT  Recommendations for Other Services       Functional Status Assessment Patient has had a recent decline in their functional status and demonstrates the ability to make significant improvements in function in a reasonable and predictable amount of time.      Precautions / Restrictions Precautions Precautions: None      Mobility  Bed Mobility Overal bed mobility: Needs Assistance Bed Mobility: Supine to Sit, Sit to Supine     Supine to sit: Supervision Sit to supine: Supervision        Transfers Overall transfer level: Needs assistance Equipment used: Rolling walker (2 wheels) Transfers: Sit to/from Stand Sit to Stand: Min guard           General transfer comment: difficulty with initial rise however able to perform with second attempt    Ambulation/Gait Ambulation/Gait assistance: Min guard Gait Distance (Feet): 100 Feet Assistive device: Rolling walker (2 wheels) Gait Pattern/deviations: Step-through pattern, Decreased stride length, Trunk flexed       General Gait Details: verbal cues for posture, RW positioning; pt denies dizziness, pt self limiting distance  Stairs            Wheelchair Mobility    Modified Rankin (Stroke Patients Only)       Balance Overall balance assessment: Needs assistance         Standing balance support: Reliant on assistive device for balance   Standing balance comment: utilized RW for ambulation                             Pertinent Vitals/Pain Pain Assessment Pain Assessment: No/denies pain    Home Living Family/patient expects to be discharged to:: Private residence Living Arrangements: Children (daughter) Available Help at Discharge: Family;Available 24 hours/day Type of Home: House Home Access: Stairs to enter Entrance Stairs-Rails: Right Entrance Stairs-Number of Steps: 2 Alternate Level Stairs-Number  of Steps: 14 Home Layout: Two level;Bed/bath upstairs Home Equipment: Cane - single point;Grab bars - tub/shower;Grab bars - toilet;Shower seat      Prior Function Prior Level of Function : Independent/Modified Independent               ADLs Comments: family assists with IADLS     Hand Dominance        Extremity/Trunk  Assessment        Lower Extremity Assessment Lower Extremity Assessment: Generalized weakness       Communication   Communication: No difficulties  Cognition Arousal/Alertness: Awake/alert Behavior During Therapy: WFL for tasks assessed/performed Overall Cognitive Status: Within Functional Limits for tasks assessed                                          General Comments      Exercises     Assessment/Plan    PT Assessment Patient needs continued PT services  PT Problem List Decreased strength;Decreased activity tolerance;Decreased mobility;Decreased knowledge of use of DME       PT Treatment Interventions DME instruction;Gait training;Therapeutic exercise;Balance training;Functional mobility training;Therapeutic activities;Patient/family education;Stair training    PT Goals (Current goals can be found in the Care Plan section)  Acute Rehab PT Goals PT Goal Formulation: With patient Time For Goal Achievement: 01/30/22 Potential to Achieve Goals: Good    Frequency Min 3X/week     Co-evaluation               AM-PAC PT "6 Clicks" Mobility  Outcome Measure Help needed turning from your back to your side while in a flat bed without using bedrails?: A Little Help needed moving from lying on your back to sitting on the side of a flat bed without using bedrails?: A Little Help needed moving to and from a bed to a chair (including a wheelchair)?: A Little Help needed standing up from a chair using your arms (e.g., wheelchair or bedside chair)?: A Little Help needed to walk in hospital room?: A Little Help needed climbing 3-5 steps with a railing? : A Little 6 Click Score: 18    End of Session Equipment Utilized During Treatment: Gait belt Activity Tolerance: Patient tolerated treatment well Patient left: with call bell/phone within reach;in bed   PT Visit Diagnosis: Difficulty in walking, not elsewhere classified (R26.2)    Time:  2778-2423 PT Time Calculation (min) (ACUTE ONLY): 12 min   Charges:   PT Evaluation $PT Eval Low Complexity: 1 Low        Kati PT, DPT Physical Therapist Acute Rehabilitation Services Preferred contact method: Secure Chat Weekend Pager Only: [phone] Office: [place] 01/16/2022, 1:17 PM

## 2022-01-17 ENCOUNTER — Inpatient Hospital Stay (HOSPITAL_COMMUNITY): Payer: Medicare Other

## 2022-01-17 ENCOUNTER — Encounter (HOSPITAL_COMMUNITY)
Admission: EM | Disposition: A | Discharge status: Home Health | Payer: Self-pay | Source: Home / Self Care | Attending: Internal Medicine

## 2022-01-17 ENCOUNTER — Inpatient Hospital Stay (HOSPITAL_COMMUNITY): Payer: Medicare Other | Admitting: Certified Registered Nurse Anesthetist

## 2022-01-17 ENCOUNTER — Encounter (HOSPITAL_COMMUNITY): Payer: Self-pay | Admitting: Internal Medicine

## 2022-01-17 DIAGNOSIS — N210 Calculus in bladder: Secondary | ICD-10-CM

## 2022-01-17 DIAGNOSIS — I10 Essential (primary) hypertension: Secondary | ICD-10-CM

## 2022-01-17 DIAGNOSIS — D649 Anemia, unspecified: Secondary | ICD-10-CM | POA: Diagnosis not present

## 2022-01-17 DIAGNOSIS — R319 Hematuria, unspecified: Secondary | ICD-10-CM

## 2022-01-17 DIAGNOSIS — Z87891 Personal history of nicotine dependence: Secondary | ICD-10-CM

## 2022-01-17 HISTORY — PX: IR EMBO ART  VEN HEMORR LYMPH EXTRAV  INC GUIDE ROADMAPPING: IMG5450

## 2022-01-17 HISTORY — PX: IR 3D INDEPENDENT WKST: IMG2385

## 2022-01-17 HISTORY — PX: IR ANGIOGRAM PELVIS SELECTIVE OR SUPRASELECTIVE: IMG661

## 2022-01-17 HISTORY — PX: IR US GUIDE VASC ACCESS RIGHT: IMG2390

## 2022-01-17 HISTORY — PX: TRANSURETHRAL RESECTION OF BLADDER TUMOR: SHX2575

## 2022-01-17 HISTORY — PX: IR ANGIOGRAM SELECTIVE EACH ADDITIONAL VESSEL: IMG667

## 2022-01-17 LAB — BASIC METABOLIC PANEL
Anion gap: 7 (ref 5–15)
BUN: 19 mg/dL (ref 8–23)
CO2: 24 mmol/L (ref 22–32)
Calcium: 8.5 mg/dL — ABNORMAL LOW (ref 8.9–10.3)
Chloride: 110 mmol/L (ref 98–111)
Creatinine, Ser: 0.98 mg/dL (ref 0.61–1.24)
GFR, Estimated: >60 mL/min (ref >60)
Glucose, Bld: 104 mg/dL — ABNORMAL HIGH (ref 70–99)
Potassium: 4.4 mmol/L (ref 3.5–5.1)
Sodium: 141 mmol/L (ref 135–145)

## 2022-01-17 LAB — CBC
HCT: 25.4 % — ABNORMAL LOW (ref 39.0–52.0)
Hemoglobin: 7.4 g/dL — ABNORMAL LOW (ref 13.0–17.0)
MCH: 25.6 pg — ABNORMAL LOW (ref 26.0–34.0)
MCHC: 29.1 g/dL — ABNORMAL LOW (ref 30.0–36.0)
MCV: 87.9 fL (ref 80.0–100.0)
Platelets: 209 10*3/uL (ref 150–400)
RBC: 2.89 MIL/uL — ABNORMAL LOW (ref 4.22–5.81)
RDW: 17.7 % — ABNORMAL HIGH (ref 11.5–15.5)
WBC: 6.7 10*3/uL (ref 4.0–10.5)
nRBC: 0 % (ref 0.0–0.2)

## 2022-01-17 LAB — GLUCOSE, CAPILLARY
Glucose-Capillary: 95 mg/dL (ref 70–99)
Glucose-Capillary: 77 mg/dL (ref 70–99)
Glucose-Capillary: 104 mg/dL — ABNORMAL HIGH (ref 70–99)
Glucose-Capillary: 92 mg/dL (ref 70–99)
Glucose-Capillary: 132 mg/dL — ABNORMAL HIGH (ref 70–99)

## 2022-01-17 LAB — PROTIME-INR
INR: 1.2 (ref 0.8–1.2)
Prothrombin Time: 14.9 seconds (ref 11.4–15.2)

## 2022-01-17 LAB — PREPARE RBC (CROSSMATCH)

## 2022-01-17 SURGERY — TURBT (TRANSURETHRAL RESECTION OF BLADDER TUMOR)
Anesthesia: General

## 2022-01-17 MED ORDER — ONDANSETRON HCL 4 MG/2ML IJ SOLN
INTRAMUSCULAR | Status: DC | PRN
  Administered 2022-01-17: 4 mg via INTRAVENOUS

## 2022-01-17 MED ORDER — LIDOCAINE HCL (PF) 1 % IJ SOLN
INTRAMUSCULAR | Status: AC
  Filled 2022-01-17: qty 30

## 2022-01-17 MED ORDER — FENTANYL CITRATE (PF) 100 MCG/2ML IJ SOLN
INTRAMUSCULAR | Status: AC | PRN
  Administered 2022-01-17: 50 ug via INTRAVENOUS

## 2022-01-17 MED ORDER — LACTATED RINGERS IV SOLN: INTRAVENOUS | Status: DC

## 2022-01-17 MED ORDER — MIDAZOLAM HCL 2 MG/2ML IJ SOLN
INTRAMUSCULAR | Status: AC | PRN
  Administered 2022-01-17: .5 mg via INTRAVENOUS

## 2022-01-17 MED ORDER — MIDAZOLAM HCL 5 MG/5ML IJ SOLN
INTRAMUSCULAR | Status: AC | PRN
  Administered 2022-01-17: .5 mg via INTRAVENOUS

## 2022-01-17 MED ORDER — SODIUM CHLORIDE 0.9 % IR SOLN
Status: DC | PRN
  Administered 2022-01-17: 12000 mL

## 2022-01-17 MED ORDER — FENTANYL CITRATE PF 50 MCG/ML IJ SOSY
25.0000 ug | PREFILLED_SYRINGE | INTRAMUSCULAR | Status: DC | PRN
  Administered 2022-01-17: 50 ug via INTRAVENOUS

## 2022-01-17 MED ORDER — SODIUM CHLORIDE 0.9 % IV SOLN
INTRAVENOUS | Status: AC
  Administered 2022-01-17: 2 g via INTRAVENOUS
  Filled 2022-01-17: qty 20

## 2022-01-17 MED ORDER — GELATIN ABSORBABLE 12-7 MM EX MISC
CUTANEOUS | Status: AC
  Administered 2022-01-17: 1 via INTRA_ARTERIAL
  Filled 2022-01-17: qty 1

## 2022-01-17 MED ORDER — FENTANYL CITRATE (PF) 100 MCG/2ML IJ SOLN
INTRAMUSCULAR | Status: AC
  Filled 2022-01-17: qty 2

## 2022-01-17 MED ORDER — LIDOCAINE HCL (PF) 2 % IJ SOLN
INTRAMUSCULAR | Status: AC
  Filled 2022-01-17: qty 5

## 2022-01-17 MED ORDER — MIDAZOLAM HCL 2 MG/2ML IJ SOLN
INTRAMUSCULAR | Status: AC
  Filled 2022-01-17: qty 4

## 2022-01-17 MED ORDER — MIDAZOLAM HCL 2 MG/2ML IJ SOLN
INTRAMUSCULAR | Status: AC
  Filled 2022-01-17: qty 2

## 2022-01-17 MED ORDER — CEFAZOLIN SODIUM-DEXTROSE 2-4 GM/100ML-% IV SOLN
2.0000 g | Freq: Once | INTRAVENOUS | Status: AC
  Administered 2022-01-17: 2 g via INTRAVENOUS
  Filled 2022-01-17: qty 100

## 2022-01-17 MED ORDER — ONDANSETRON HCL 4 MG/2ML IJ SOLN
4.0000 mg | Freq: Four times a day (QID) | INTRAMUSCULAR | Status: DC | PRN
  Administered 2022-01-17 – 2022-01-27 (×2): 4 mg via INTRAVENOUS
  Filled 2022-01-17: qty 2

## 2022-01-17 MED ORDER — PHENYLEPHRINE HCL (PRESSORS) 10 MG/ML IV SOLN
INTRAVENOUS | Status: DC | PRN
  Administered 2022-01-17 (×2): 80 ug via INTRAVENOUS

## 2022-01-17 MED ORDER — IOHEXOL 300 MG/ML  SOLN
100.0000 mL | Freq: Once | INTRAMUSCULAR | Status: AC | PRN
  Administered 2022-01-17: 20 mL via INTRA_ARTERIAL

## 2022-01-17 MED ORDER — FENTANYL CITRATE (PF) 100 MCG/2ML IJ SOLN
INTRAMUSCULAR | Status: AC | PRN
  Administered 2022-01-17: 25 ug via INTRAVENOUS

## 2022-01-17 MED ORDER — IOHEXOL 300 MG/ML  SOLN
100.0000 mL | Freq: Once | INTRAMUSCULAR | Status: AC | PRN
  Administered 2022-01-17: 27 mL via INTRA_ARTERIAL

## 2022-01-17 MED ORDER — SODIUM CHLORIDE 0.9 % IV SOLN: 10.0000 mL/h | Freq: Once | INTRAVENOUS | Status: DC

## 2022-01-17 MED ORDER — IOHEXOL 300 MG/ML  SOLN
100.0000 mL | Freq: Once | INTRAMUSCULAR | Status: AC | PRN
  Administered 2022-01-17: 50 mL via INTRA_ARTERIAL

## 2022-01-17 MED ORDER — FENTANYL CITRATE (PF) 100 MCG/2ML IJ SOLN
INTRAMUSCULAR | Status: AC
  Filled 2022-01-17: qty 4

## 2022-01-17 MED ORDER — PROPOFOL 10 MG/ML IV BOLUS
INTRAVENOUS | Status: AC
  Filled 2022-01-17: qty 20

## 2022-01-17 MED ORDER — IOHEXOL 300 MG/ML  SOLN
100.0000 mL | Freq: Once | INTRAMUSCULAR | Status: AC | PRN
  Administered 2022-01-17: 26 mL via INTRA_ARTERIAL

## 2022-01-17 MED ORDER — ORAL CARE MOUTH RINSE: 15.0000 mL | Freq: Once | OROMUCOSAL | Status: AC

## 2022-01-17 MED ORDER — CHLORHEXIDINE GLUCONATE 0.12 % MT SOLN
15.0000 mL | Freq: Once | OROMUCOSAL | Status: AC
  Administered 2022-01-17: 15 mL via OROMUCOSAL

## 2022-01-17 MED ORDER — FENTANYL CITRATE (PF) 100 MCG/2ML IJ SOLN
INTRAMUSCULAR | Status: AC | PRN
  Administered 2022-01-17 (×2): 25 ug via INTRAVENOUS

## 2022-01-17 MED ORDER — PHENYLEPHRINE HCL-NACL 20-0.9 MG/250ML-% IV SOLN
INTRAVENOUS | Status: DC | PRN
  Administered 2022-01-17: 35 ug/min via INTRAVENOUS

## 2022-01-17 MED ORDER — LIDOCAINE 2% (20 MG/ML) 5 ML SYRINGE
INTRAMUSCULAR | Status: DC | PRN
  Administered 2022-01-17: 60 mg via INTRAVENOUS

## 2022-01-17 MED ORDER — CEFTRIAXONE SODIUM 2 G IJ SOLR: 2.0000 g | Freq: Once | INTRAMUSCULAR | Status: AC

## 2022-01-17 MED ORDER — ONDANSETRON HCL 4 MG/2ML IJ SOLN
INTRAMUSCULAR | Status: AC
  Filled 2022-01-17: qty 2

## 2022-01-17 MED ORDER — MIDAZOLAM HCL 2 MG/2ML IJ SOLN
INTRAMUSCULAR | Status: AC | PRN
  Administered 2022-01-17: 1 mg via INTRAVENOUS

## 2022-01-17 MED ORDER — FENTANYL CITRATE (PF) 100 MCG/2ML IJ SOLN
INTRAMUSCULAR | Status: DC | PRN
  Administered 2022-01-17 (×4): 25 ug via INTRAVENOUS

## 2022-01-17 MED ORDER — MIDAZOLAM HCL 2 MG/2ML IJ SOLN
INTRAMUSCULAR | Status: AC | PRN
  Administered 2022-01-17 (×2): .5 mg via INTRAVENOUS

## 2022-01-17 MED ORDER — FENTANYL CITRATE PF 50 MCG/ML IJ SOSY
PREFILLED_SYRINGE | INTRAMUSCULAR | Status: AC
  Administered 2022-01-17: 50 ug via INTRAVENOUS
  Filled 2022-01-17: qty 2

## 2022-01-17 MED ORDER — PROPOFOL 10 MG/ML IV BOLUS
INTRAVENOUS | Status: DC | PRN
  Administered 2022-01-17: 30 mg via INTRAVENOUS
  Administered 2022-01-17: 20 mg via INTRAVENOUS
  Administered 2022-01-17: 50 mg via INTRAVENOUS
  Administered 2022-01-17: 20 mg via INTRAVENOUS

## 2022-01-17 MED ORDER — ONDANSETRON HCL 4 MG/2ML IJ SOLN: 4.0000 mg | Freq: Once | INTRAMUSCULAR | Status: DC | PRN

## 2022-01-17 SURGICAL SUPPLY — 18 items
BAG URINE DRAIN 2000ML AR STRL (UROLOGICAL SUPPLIES) IMPLANT
BAG URO CATCHER STRL LF (MISCELLANEOUS) ×2 IMPLANT
CATH FOLEY 2WAY SLVR  5CC 16FR (CATHETERS) ×1
CATH FOLEY 2WAY SLVR 30CC 20FR (CATHETERS) ×1 IMPLANT
CATH FOLEY 2WAY SLVR 5CC 16FR (CATHETERS) IMPLANT
DRAPE FOOT SWITCH (DRAPES) ×2 IMPLANT
GLOVE BIOGEL PI IND STRL 6.5 (GLOVE) IMPLANT
GLOVE BIOGEL PI INDICATOR 6.5 (GLOVE) ×1
GLOVE SURG LX 7.5 STRW (GLOVE) ×2
GLOVE SURG LX STRL 7.5 STRW (GLOVE) ×1 IMPLANT
GOWN STRL REUS W/ TWL XL LVL3 (GOWN DISPOSABLE) ×1 IMPLANT
GOWN STRL REUS W/TWL XL LVL3 (GOWN DISPOSABLE) ×3
KIT TURNOVER KIT A (KITS) IMPLANT
LOOP CUT BIPOLAR 24F LRG (ELECTROSURGICAL) ×1 IMPLANT
MANIFOLD NEPTUNE II (INSTRUMENTS) ×2 IMPLANT
PACK CYSTO (CUSTOM PROCEDURE TRAY) ×2 IMPLANT
TUBING CONNECTING 10 (TUBING) ×2 IMPLANT
TUBING UROLOGY SET (TUBING) ×2 IMPLANT

## 2022-01-17 NOTE — Progress Notes (Signed)
I called and spoke with Nathan [name]. As best as I can tell the prostate is the obvious source of bleeding and we were not able to cauterize and stop it. He also received another unit of blood. Agree with plan for PAE.

## 2022-01-17 NOTE — Procedures (Signed)
Vascular and Interventional Radiology Procedure Note  Patient: Nathan [name] DOB: 01/28/1946 Medical Record Number: [id number] Note Date/Time: 01/17/22 5:14 PM   Performing Physician: Michaelle Birks, MD Assistant(s): None  Diagnosis: Prostatomegaly with hematuria.  Procedure(s):  PELVIC ARTERIOGRAPHY, including CONE BEAM CTA of the PELVIS PROSTATE ARTERY EMBOLIZATION  Anesthesia: Conscious Sedation Complications: None Estimated Blood Loss: Minimal Specimens: None  Findings:  - access via the RIGHT femoral artery. - Prostatic arteries arising from the anterior division, gluteo-pudendal trunk (Type II) on L and off the obturator artery on R (Type III) - Active extravasation into urinary bladder on L prostate artery arteriogram, consistent with hematuria - Successful microparticle PAE and Gelfoam slurry, with cross filling of the R prostate - Vasospasm of the shared origin of R obturator artery, unable to embolize via the R prostatic artery - AngioSeal closure at the R groin with distal RLE pulses at the end of the case.  Plan: - Post sheath removal precautions.  - Bedrest with RLE straight x2hrs.  Final report to follow once all images are reviewed and compared with previous studies.  See detailed dictation with images in PACS. The patient tolerated the procedure well without incident or complication and was returned to Floor Bed in stable condition.    Michaelle Birks, MD [facility] Radiology   Pager. [place]

## 2022-01-17 NOTE — Op Note (Addendum)
Preoperative Diagnosis: Hematuria  Postoperative Diagnosis:  Hematuria, bladder stones  Procedure(s) Performed:   - Cystourethroscopy - Clot evacuation - Cystolitholapaxy (15 mm) - Fulguration of prostate   Teaching Surgeon:  Festus Aloe, MD  Resident Surgeon:  Florentina Addison, MD  Assistant(s):  None  Anesthesia:  General  Fluids:  See anesthesia record  Estimated blood loss:  10cc  Specimens: None  Drains:   16Fr SP tube 20Fr foley catheter  Complications:  None  Indications: 76 y.o. patient with a history of prostate cancer s/p external beam. He has refractory bleeding since 2021 but worse over past few weeks even stopping anticoagulation. Risks & benefits of the procedure discussed with the patient and his daughter whom wish to proceed. He is scheduled for PAE and we want to localize the source of bleeding as best as possible and rule out any non-prostatic causes of his hematuria.   Findings:   Very large prostate with large median lobe/intravesical extension (difficult to see UO/trigone), normal anterior urethra No bladder lesions/masses-adequate look at bladder. Isolated patches of cystitis cystica/mucosal irritation c/w SP tube without bleeding (no areas or bleeding or hemorrhagic cystitis).  2-3 small, very soft bladder stones which were crushed with the loop and removed via scope sheath Diffuse oozing from proximal prostate/intravesical component Replacement of SP tube and placement of 20Fr foley catheter, CBI started   Description:  The patient was correctly identified in the preop holding area where written informed consent as well potential risk and complication reviewed. The patient agreed. They were brought back to the operative suite and after adequate anesthesia placed into dorsal lithotomy position. They were prepped and draped in the usual sterile fashion and given appropriate preoperative antibiotics. Timeout was performed to confirm the patient and  procedure.    We inserted a 81F rigid cystoscope per urethra with copious lubrication and normal saline irrigation running. This demonstrated findings as described above.    We proceeded to hand irrigate the clot out through the scope sheath with the syringe - about 100 cc. Once the bladder was more clear, we switched to the continuous flow resectoscope setup. There were some focal areas of prostatic oozing near the bladder neck which were controlled with electrocautery. We got an adequate look at the bladder and no bleeding from the bladder mucosa could be identified. We then used the loop to crush two-three small bladder stones that were very soft and were evacuated through the scope sheath. Reinspection of the bladder was without significant bleeding other than general oozing from the proximal prostate and intravesical component. Despite spot cautery of the prostate we could not stop the bleeding. Therefore, the existing SP tube was replaced with a 16Fr foley. We placed a 20Fr foley catheter per urethra on gentle traction and CBI was started through the SP tube.  The patient was woken up from anesthesia and taken to the recovery unit for routine postoperative care.   Post Op Plan:   1. Proceed with planned PAE today 2. Please keep patient NPO 3. Continue CBI - Please page urology if there is worsening of his urine character or if there is concern for poor drainage  I was present and scrubbed for the entire procedure.

## 2022-01-17 NOTE — Transfer of Care (Signed)
Immediate Anesthesia Transfer of Care Note  Patient: Nathan [name]  Procedure(s) Performed: CYSTOSCOPY WITH CLOT EVACUATION, [place] OF PROSTATE  Patient Location: PACU  Anesthesia Type:General  Level of Consciousness: awake, alert  and oriented  Airway & Oxygen Therapy: Patient Spontanous Breathing and Patient connected to face mask oxygen  Post-op Assessment: Report given to RN and Post -op Vital signs reviewed and stable  Post vital signs: Reviewed and stable  Last Vitals:  Vitals Value Taken Time  BP 130/73 01/17/22 0900  Temp    Pulse 69 01/17/22 0907  Resp    SpO2 100 % 01/17/22 0907  Vitals shown include unvalidated device data.  Last Pain:  Vitals:   01/17/22 0719  TempSrc:   PainSc: 0-No pain      Patients Stated Pain Goal: 3 (90/93/11 2162)  Complications: No notable events documented.

## 2022-01-17 NOTE — Anesthesia Postprocedure Evaluation (Signed)
Anesthesia Post Note  Patient: Nathan [name]  Procedure(s) Performed: CYSTOSCOPY WITH CLOT EVACUATION, [place] PROSTATE     Patient location during evaluation: PACU Anesthesia Type: General Level of consciousness: awake and alert, oriented and patient cooperative Pain management: pain level controlled Vital Signs Assessment: post-procedure vital signs reviewed and stable Respiratory status: spontaneous breathing, nonlabored ventilation and respiratory function stable Cardiovascular status: blood pressure returned to baseline and stable Postop Assessment: no apparent nausea or vomiting Anesthetic complications: no   No notable events documented.  Last Vitals:  Vitals:   01/17/22 0621 01/17/22 0900  BP: 122/78   Pulse: 66   Resp:  16  Temp:    SpO2:      Last Pain:  Vitals:   01/17/22 0919  TempSrc:   PainSc: [place]

## 2022-01-17 NOTE — Anesthesia Preprocedure Evaluation (Addendum)
Anesthesia Evaluation  Patient identified by MRN, date of birth, ID band Patient awake    Reviewed: Allergy & Precautions, NPO status , Patient's Chart, lab work & pertinent test results, reviewed documented beta blocker date and time   Airway Mallampati: III  TM Distance: >3 FB Neck ROM: Full    Dental  (+) Edentulous Upper, Dental Advisory Given, Missing,    Pulmonary former smoker,    Pulmonary exam normal breath sounds clear to auscultation       Cardiovascular hypertension, Pt. on medications and Pt. on home beta blockers Normal cardiovascular exam+ dysrhythmias Atrial Fibrillation  Rhythm:Irregular Rate:Normal     Neuro/Psych negative neurological ROS  negative psych ROS   GI/Hepatic negative GI ROS, Neg liver ROS,   Endo/Other  diabetes, Well Controlled, Type 2, Insulin DependentFS 74 in preop, asymptomatic   Renal/GU negative Renal ROS Bladder dysfunction      Musculoskeletal negative musculoskeletal ROS (+)   Abdominal   Peds  Hematology  (+) Blood dyscrasia, anemia , Gross hematuria 2/2 prostate radiation- initially presented with Hb 5 about 4d ago and was transfused. Has not had prbc since then but Hb has drifted down to 7.4 this AM   Anesthesia Other Findings   Reproductive/Obstetrics negative OB ROS                            Anesthesia Physical Anesthesia Plan  ASA: 3  Anesthesia Plan: General   Post-op Pain Management:    Induction: Intravenous  PONV Risk Score and Plan: 3 and Ondansetron and Treatment may vary due to age or medical condition  Airway Management Planned: LMA  Additional Equipment: None  Intra-op Plan:   Post-operative Plan: Extubation in OR  Informed Consent: I have reviewed the patients History and Physical, chart, labs and discussed the procedure including the risks, benefits and alternatives for the proposed anesthesia with the patient or  authorized representative who has indicated his/her understanding and acceptance.     Dental advisory given and Consent reviewed with POA  Plan Discussed with: CRNA  Anesthesia Plan Comments: (Pt cannot tell me what procedure he is having done today, so consent obtained from daughter via telephone. Hb 7.4, will transude another unit of prbc intraop. Still with frank blood in foley )       Anesthesia Quick Evaluation

## 2022-01-17 NOTE — Progress Notes (Addendum)
  S: Romello is well this AM. No chills, CP or SOB. Urine remains red, dark violet.   O: . Vitals:   01/17/22 0451 01/17/22 0621  BP: 115/71 122/78  Pulse: 63 66  Resp: 18   Temp: 98.5 F (36.9 C)   SpO2: 100%    .CBC    Component Value Date/Time   WBC 6.7 01/17/2022 0527   RBC 2.89 (L) 01/17/2022 0527   HGB 7.4 (L) 01/17/2022 0527   HCT 25.4 (L) 01/17/2022 0527   PLT 209 01/17/2022 0527   MCV 87.9 01/17/2022 0527   MCH 25.6 (L) 01/17/2022 0527   MCHC 29.1 (L) 01/17/2022 0527   RDW 17.7 (H) 01/17/2022 0527   LYMPHSABS 0.8 01/11/2022 2000   MONOABS 0.7 01/11/2022 2000   EOSABS 0.2 01/11/2022 2000   BASOSABS 0.0 01/11/2022 2000   . Lab Results  Component Value Date   CREATININE 0.98 01/17/2022   CREATININE 0.97 01/13/2022   CREATININE 1.04 01/11/2022   NAD CV - RRR Resp - reg effort and depth Abd- soft, NT SP tube - urine red, purple Ext - no calf pain or swelling.   A/P: BPH, gross hematuria, h/o PCa s/p XRT -   I discussed with the patient the nature, potential benefits, risks and alternatives to cystoscopy poss TURP, TURBT, cystolithopaxy, including side effects of the proposed treatment, the likelihood of the patient achieving the goals of the procedure, and any potential problems that might occur during the procedure or recuperation. All questions answered. Patient elects to proceed. Anesthesia did order a unit of blood.

## 2022-01-17 NOTE — Progress Notes (Signed)
PROGRESS NOTE  Nathan [name] LFY:[id number] DOB: 1946/05/15 DOA: 01/11/2022 PCP: [name], MD  Brief History   Nathan [name] is an 76 y.o. male past medical history significant for prostate cancer status post radioactive seed implant with suprapubic catheter and chronic hematuria, chronic atrial fibrillation, diabetes mellitus type 2 recent history of DVT status post IVC filter who comes in for gross hematuria patient is a limited historian and most of the history was obtained from the daughter, went to his PCP his hemoglobin was 6 was sent to the ED in the ED, it was 5.  The patient was transfused with 4 units of PRBC's. Urology was consulted. The patient underwent cystoscopy and fulgration of prostate yesterday am. However, it was clear that Prastatic artery embolization would be necessary. The patient underwent this procedure on 01/17/2022. He tolerated the procedure well and is currently on CBI. The fluid in the receiving bag is red.   Consultants  Urology Interventional Radiology  Procedures  Cystoscopy with fulguration of bleeding prostate. Prostatic artery embolization  Antibiotics   Anti-infectives (From admission, onward)    Start     Dose/Rate Route Frequency Ordered Stop   01/17/22 1415  cefTRIAXone (ROCEPHIN) 2 g in sodium chloride 0.9 % 100 mL IVPB        2 g 200 mL/hr over 30 Minutes Intravenous  Once 01/17/22 1403 01/17/22 1430   01/17/22 0700  ceFAZolin (ANCEF) IVPB 2g/100 mL premix        2 g 200 mL/hr over 30 Minutes Intravenous  Once 01/17/22 0659 01/17/22 0812   01/12/22 0100  cefTRIAXone (ROCEPHIN) 1 g in sodium chloride 0.9 % 100 mL IVPB  Status:  Discontinued        1 g 200 mL/hr over 30 Minutes Intravenous Every 24 hours 01/12/22 0050 01/13/22 1329      Interval History/Subjective  The patient is seen following his procedure. He is complaining of nausea. Otherwise he has tolerated the procedure well.   Objective   Vitals:  Vitals:   01/17/22  1649 01/17/22 1715  BP: (!) 98/58 108/77  Pulse: 85 73  Resp: 18   Temp: 97.6 F (36.4 C)   SpO2: 100% 99%    Exam:  Constitutional:  Appears calm and comfortable Respiratory:  CTA bilaterally, no w/r/r.  Respiratory effort normal. No retractions or accessory muscle use Cardiovascular:  RRR, no m/r/g No LE extremity edema   Normal pedal pulses Abdomen:  Abdomen appears normal; no tenderness or masses No hernias No HSM Musculoskeletal:  Digits/nails BUE: no clubbing, cyanosis, petechiae, infection exam of joints, bones, muscles of at least one of following: head/neck, RUE, LUE, RLE, LLE   strength and tone normal, no atrophy, no abnormal movements No tenderness, masses Normal ROM, no contractures  gait and station Skin:  No rashes, lesions, ulcers palpation of skin: no induration or nodules Neurologic:  CN 2-12 intact Sensation all 4 extremities intact Psychiatric:  Mental status Mood, affect appropriate Orientation to person, place, time  judgment and insight appear intact     I have personally reviewed the following:   Today's Data  Vitals  Lab Data  CBC BMP  Micro Data  Urine culture from 01/11/2022 had insignificant growth  Imaging  CTA abdomen and pelvis  Cardiology Data    Other Data    Scheduled Meds:  Chlorhexidine Gluconate Cloth  6 each Topical Daily   finasteride  5 mg Oral Daily   folic acid  1 mg Oral Daily  gabapentin  300 mg Oral TID   insulin aspart  0-15 Units Subcutaneous TID WC   insulin aspart  0-5 Units Subcutaneous QHS   insulin aspart  4 Units Subcutaneous TID WC   lidocaine (PF)       lisinopril  40 mg Oral Daily   magnesium oxide  400 mg Oral Daily   metoprolol succinate  100 mg Oral Daily   vitamin B-12  1,000 mcg Oral Daily   Continuous Infusions:  sodium chloride      Principal Problem:   Symptomatic anemia Active Problems:   Atrial fibrillation (HCC)   Malignant neoplasm of prostate (HCC)   Hematuria    HTN (hypertension)   DM2 (diabetes mellitus, type 2) (HCC)   History of DVT (deep vein thrombosis)   Gross hematuria   LOS: 5 days   A & P  Symptomatic anemia: Patient presented with a hemoglobin of 4. He has received 4 units of PRBC's in transfusion. Hemoglobin this am is stable at 8.0.  Atrial Fibrillation: Rate is controlled and is ranging from 87 - 112. Anticoagulation is held due to acute bleeding and severe anemia.  Acute hematuria: The patient is bleeding from the prostate. He has undergone PAE on 01/17/2022. He has tolerated the procedure well. Treatment as per urology and interventional radiology.  Malignant Neoplasm of the Prostate: Noted Treatment as per urology.  DM II: Glucoses are being addressed with 4 units Novolog with meals and FSBS followed with SSI. Glucoses for the past 24 hours have been 77-104.  History of DVT: S/P IVC filter.  Hypertension: Blood pressures have been normotensive on lisinopril 40 mg daily and metoprolol succinate 100 mg daily.  I have seen and examined this patient myself. I have spent 34 minutes in his evaluation and care.  DVT prophylaxis: SCD's Code Status: Full Code Family Communication: Spouse is at bedside. Disposition Plan: Home    Lovinia Snare, DO [facility] Direct contact: see [email or web address]  7PM-7AM contact night coverage as above 01/17/2022, 6:32 PM  LOS: 5 days

## 2022-01-17 NOTE — Anesthesia Procedure Notes (Signed)
Procedure Name: LMA Insertion Date/Time: 01/17/2022 8:11 AM  Performed by: Maxwell Caul, CRNAPre-anesthesia Checklist: Patient identified, Emergency Drugs available, Suction available and Patient being monitored Patient Re-evaluated:Patient Re-evaluated prior to induction Oxygen Delivery Method: Circle system utilized Preoxygenation: Pre-oxygenation with 100% oxygen Induction Type: IV induction LMA: LMA with gastric port inserted LMA Size: 5.0 Number of attempts: 1 Placement Confirmation: positive ETCO2 and breath sounds checked- equal and bilateral Tube secured with: Tape Dental Injury: Teeth and Oropharynx as per pre-operative assessment

## 2022-01-17 NOTE — Addendum Note (Signed)
Addendum  created 01/17/22 0951 by Maxwell Caul, CRNA   Charge Capture section accepted

## 2022-01-18 ENCOUNTER — Encounter (HOSPITAL_COMMUNITY): Payer: Self-pay | Admitting: Urology

## 2022-01-18 DIAGNOSIS — D649 Anemia, unspecified: Secondary | ICD-10-CM | POA: Diagnosis not present

## 2022-01-18 LAB — BASIC METABOLIC PANEL
Anion gap: 8 (ref 5–15)
BUN: 20 mg/dL (ref 8–23)
CO2: 24 mmol/L (ref 22–32)
Calcium: 8.3 mg/dL — ABNORMAL LOW (ref 8.9–10.3)
Chloride: 107 mmol/L (ref 98–111)
Creatinine, Ser: 1.25 mg/dL — ABNORMAL HIGH (ref 0.61–1.24)
GFR, Estimated: 59 mL/min — ABNORMAL LOW (ref >60)
Glucose, Bld: 119 mg/dL — ABNORMAL HIGH (ref 70–99)
Potassium: 4.4 mmol/L (ref 3.5–5.1)
Sodium: 139 mmol/L (ref 135–145)

## 2022-01-18 LAB — GLUCOSE, CAPILLARY
Glucose-Capillary: 126 mg/dL — ABNORMAL HIGH (ref 70–99)
Glucose-Capillary: 121 mg/dL — ABNORMAL HIGH (ref 70–99)
Glucose-Capillary: 84 mg/dL (ref 70–99)
Glucose-Capillary: 128 mg/dL — ABNORMAL HIGH (ref 70–99)

## 2022-01-18 LAB — CBC WITH DIFFERENTIAL/PLATELET
Abs Immature Granulocytes: 0.06 10*3/uL (ref 0.00–0.07)
Basophils Absolute: 0 10*3/uL (ref 0.0–0.1)
Basophils Relative: 0 %
Eosinophils Absolute: 0 10*3/uL (ref 0.0–0.5)
Eosinophils Relative: 0 %
HCT: 24.6 % — ABNORMAL LOW (ref 39.0–52.0)
Hemoglobin: 7.6 g/dL — ABNORMAL LOW (ref 13.0–17.0)
Immature Granulocytes: 1 %
Lymphocytes Relative: 8 %
Lymphs Abs: 0.8 10*3/uL (ref 0.7–4.0)
MCH: 26.8 pg (ref 26.0–34.0)
MCHC: 30.9 g/dL (ref 30.0–36.0)
MCV: 86.6 fL (ref 80.0–100.0)
Monocytes Absolute: 1.1 10*3/uL — ABNORMAL HIGH (ref 0.1–1.0)
Monocytes Relative: 11 %
Neutro Abs: 7.6 10*3/uL (ref 1.7–7.7)
Neutrophils Relative %: 80 %
Platelets: 217 10*3/uL (ref 150–400)
RBC: 2.84 MIL/uL — ABNORMAL LOW (ref 4.22–5.81)
RDW: 17.1 % — ABNORMAL HIGH (ref 11.5–15.5)
WBC: 9.6 10*3/uL (ref 4.0–10.5)
nRBC: 0.2 % (ref 0.0–0.2)

## 2022-01-18 NOTE — Progress Notes (Signed)
Referring Physician(s): Urology Service  Supervising Physician: Daryll Brod  Patient Status:  Nathan [name] - In-pt  Chief Complaint:  1 day s/p PAE by IR and cystoscopy and bladder neck fulguration with clot evacuation by Urology  Subjective: Feels as though he always needs to void. Urology reassured by progress of urine.   Allergies: Patient has no known allergies.  Medications: Prior to Admission medications   Medication Sig Start Date End Date Taking? Authorizing Provider  Ergocalciferol (VITAMIN D2 PO) Take 50,000 Units by mouth daily.   Yes [provider]  finasteride (PROSCAR) 5 MG tablet Take 5 mg by mouth daily.   Yes [provider]  folic acid (FOLVITE) 1 MG tablet Take 1 mg by mouth daily. 03/24/19  Yes [provider]  gabapentin (NEURONTIN) 300 MG capsule Take 300 mg by mouth 3 (three) times daily. 10/27/21  Yes [provider]  lisinopril (ZESTRIL) 40 MG tablet Take 40 mg by mouth daily. 09/24/20  Yes [provider]  magnesium oxide (MAG-OX) 400 MG tablet Take 400 mg by mouth daily.  03/24/19  Yes [provider]  metoprolol succinate (TOPROL-XL) 100 MG 24 hr tablet Take 100 mg by mouth daily. 11/14/21  Yes [provider]  oxycodone (OXY-IR) 5 MG capsule Take 5 mg by mouth every 6 (six) hours as needed for pain.   Yes [provider]  Prenatal Vit-Fe Fumarate-FA (PRENATAL MULTIVITAMIN) TABS tablet Take 1 tablet by mouth daily at 12 noon.   Yes [provider]  vitamin B-12 (CYANOCOBALAMIN) 1000 MCG tablet Take 1,000 mcg by mouth daily.   Yes [provider]     Vital Signs: BP 100/68 (BP Location: Left Arm)   Pulse 96   Temp 98.5 F (36.9 C) (Oral)   Resp 16   Ht _0  (1.854 m)   Wt 195 lb (Nathan.5 kg)   SpO2 100%   BMI 25.73 kg/m   Physical Exam Constitutional:      Appearance: He is ill-appearing.  HENT:     Head: Normocephalic and atraumatic.     Mouth/Throat:      Pharynx: Oropharynx is clear.  Eyes:     Extraocular Movements: Extraocular movements intact.  Pulmonary:     Effort: Pulmonary effort is normal. No respiratory distress.  Genitourinary:    Comments: Urine a strawberry color of sorts without any visible clots.   Skin:    General: Skin is dry.  Neurological:     General: No focal deficit present.     Mental Status: He is alert.  Psychiatric:        Mood and Affect: Mood normal.        Behavior: Behavior normal.     Imaging: IR Angiogram Pelvis Selective Or Supraselective  Result Date: 01/18/2022 INDICATION: Briefly, 76 year old [name] comorbid with history of prostate cancer s/p brachytherapy and AFib previously on anticoagulation who presented with gross hematuria refractory to urologic intervention. EXAM: Procedures: 1. PROSTATE ARTERY EMBOLIZATION, LEFT 2. PELVIC ARTERIOGRAPHY and CONE BEAM CT ANGIOGRAPHY of the PELVIS MEDICATIONS: Rocephin 2 gm IV. The antibiotic was administered within one hour of the procedure ANESTHESIA/SEDATION: Moderate (conscious) sedation was employed during this procedure. A total of Versed 4.5 mg and Fentanyl 300 mcg was administered intravenously. Moderate Sedation Time: 154 minutes. The patient's level of consciousness and vital signs were monitored continuously by radiology nursing throughout the procedure under my direct supervision. CONTRAST:  81m OMNIPAQUE IOHEXOL 300 MG/ML SOLN, 272mOMNIPAQUE IOHEXOL 300 MG/ML  SOLN, 27m OMNIPAQUE IOHEXOL 300 MG/ML SOLN, 559mOMNIPAQUE IOHEXOL 300 MG/ML SOLN FLUOROSCOPY TIME:  Fluoroscopic dose; 76580Gy COMPLICATIONS: None immediate. PROCEDURE: Informed consent was obtained from the the patient and/or patient's representative following explanation of the procedure, risks, benefits and alternatives. The patient understands, agrees and consents for the procedure. All questions were addressed. A time out was performed prior to the initiation of the procedure. Maximal barrier  sterile technique utilized including caps, mask, sterile gowns, sterile gloves, large sterile drape, hand hygiene, and Betadine prep. The RIGHT femoral head was marked fluoroscopically. Under sterile conditions and local anesthesia, the RIGHT common femoral artery access was performed with a micropuncture needle. Under direct ultrasound guidance, the right common femoral was accessed with a micropuncture kit. An ultrasound image was saved for documentation purposes. This allowed for placement of a 5 Fr vascular sheath. A limited arteriogram was performed through the side arm of the sheath confirming appropriate access within the RIGHT common femoral artery. The 5 Fr C2 catheter was utilized to select the contralateral LEFT internal iliac artery then cone beam CT angiogram was performed, with post processing performed at a separate workstation. Additionally, selective LEFT prostatic arteriogram was performed followed by catheterization with a 1.9 Fr 165 cm Progreat lambda microcatheter and 0.016 inch Fathom micro guide wire. Access was adequate for embolization. Embolization was performed with 100-300 um and 300-500 um embosphere micro particles followed by Gelfoam slurry. Active extravasation was noted from the LEFT prostate into the urinary bladder. Post embolization angiogram confirms stasis of the LEFT prostatic vascular territory. The 5 Fr catheter was retracted and using a Waltman loop was inserted into the ipsilateral/RIGHT internal iliac artery then cone beam CT angiogram was performed. Additionally selective RIGHT prostatic arteriogram was also performed followed by catheterization using microcatheter and microwire. A common origin of the RIGHT obturator artery and prostatic artery was present with a challenging posteromedial angle of origin. The common origin of the obturator and prostatic artery ended up with vasospasm and therefore we could not cannulate the RIGHT prostatic artery for safe embolization.  The microcatheter and base catheter were retracted. Embolization was not performed on this side. CONE BEAM CT ANGIOGRAPHY: To utilize vascular tracking software, a cone beam CTA was then performed from the LEFT and RIGHT internal iliac arteries, and post processing was performed at a separate workstation. PURPOSE OF THE ARTERIOGRAMS: No previous catheter-directed angiogram was available. Therefore a new complete diagnostic angiography was performed. The decision to proceed with an interventional procedure was made based on this new diagnostic angiogram. ARTERIAL CLOSURE: At this point, all wires, catheters and sheaths were removed from the patient. The sheath and catheter were removed and hemostasis was achieved by Angio-Seal closure of the RIGHT common femoral artery access. The puncture site was be dressed in a sterile manner. The patient tolerated the procedure well without immediate post procedural complication. FINDINGS: - access via the RIGHT common femoral artery. - Prostatic arteries arising from the anterior division, gluteo-pudendal trunk (Type II) on the LEFT and off the obturator artery on the RIGHT (Type III) - Active extravasation into urinary bladder on LEFT prostate artery arteriogram, consistent with hematuria - Successful microparticle prostate artery embolization (PAE) and Gelfoam slurry, with cross filling of the RIGHT prostatic gland. - Vasospasm of the shared origin of RIGHT obturator artery, unable to embolize via the RIGHT prostatic artery - AngioSeal closure at the RIGHT groin with distal RLE pulses at the end of the case. IMPRESSION: 1. Successful LEFT prostatic artery  microparticle embolization for refractory hematuria via trans femoral approach. 2. 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 Embolization was not performed on this side. PLAN: Continuous bladder irrigation (CBI) per Urology and continue to monitor for hematuria with serial H/h.  Michaelle Birks, MD [facility] Radiology Electronically Signed   By: Michaelle Birks M.D.   On: 01/18/2022 08:59   IR US Guide Vasc Access Right  Result Date: 01/18/2022 INDICATION: Briefly, 76 year old [name] comorbid with history of prostate cancer s/p brachytherapy and AFib previously on anticoagulation who presented with gross hematuria refractory to urologic intervention. EXAM: Procedures: 1. PROSTATE ARTERY EMBOLIZATION, LEFT 2. PELVIC ARTERIOGRAPHY and CONE BEAM CT ANGIOGRAPHY of the PELVIS MEDICATIONS: Rocephin 2 gm IV. The antibiotic was administered within one hour of the procedure ANESTHESIA/SEDATION: Moderate (conscious) sedation was employed during this procedure. A total of Versed 4.5 mg and Fentanyl 300 mcg was administered intravenously. Moderate Sedation Time: 154 minutes. The patient's level of consciousness and vital signs were monitored continuously by radiology nursing throughout the procedure under my direct supervision. CONTRAST:  76m OMNIPAQUE IOHEXOL 300 MG/ML SOLN, 270mOMNIPAQUE IOHEXOL 300 MG/ML SOLN, 2695mMNIPAQUE IOHEXOL 300 MG/ML SOLN, 70m103mNIPAQUE IOHEXOL 300 MG/ML SOLN FLUOROSCOPY TIME:  Fluoroscopic dose; 764 267 COMPLICATIONS: None immediate. PROCEDURE: Informed consent was obtained from the the patient and/or patient's representative following explanation of the procedure, risks, benefits and alternatives. The patient understands, agrees and consents for the procedure. All questions were addressed. A time out was performed prior to the initiation of the procedure. Maximal barrier sterile technique utilized including caps, mask, sterile gowns, sterile gloves, large sterile drape, hand hygiene, and Betadine prep. The RIGHT femoral head was marked fluoroscopically. Under sterile conditions and local anesthesia, the RIGHT common femoral artery access was performed with a micropuncture needle. Under direct ultrasound guidance, the right  common femoral was accessed with a micropuncture kit. An ultrasound image was saved for documentation purposes. This allowed for placement of a 5 Fr vascular sheath. A limited arteriogram was performed through the side arm of the sheath confirming appropriate access within the RIGHT common femoral artery. The 5 Fr C2 catheter was utilized to select the contralateral LEFT internal iliac artery then cone beam CT angiogram was performed, with post processing performed at a separate workstation. Additionally, selective LEFT prostatic arteriogram was performed followed by catheterization with a 1.9 Fr 165 cm Progreat lambda microcatheter and 0.016 inch Fathom micro guide wire. Access was adequate for embolization. Embolization was performed with 100-300 um and 300-500 um embosphere micro particles followed by Gelfoam slurry. Active extravasation was noted from the LEFT prostate into the urinary bladder. Post embolization angiogram confirms stasis of the LEFT prostatic vascular territory. The 5 Fr catheter was retracted and using a Waltman loop was inserted into the ipsilateral/RIGHT internal iliac artery then cone beam CT angiogram was performed. Additionally selective RIGHT prostatic arteriogram was also performed followed by catheterization using microcatheter and microwire. A common origin of the RIGHT obturator artery and prostatic artery was present with a challenging posteromedial angle of origin. The common origin of the obturator and prostatic artery ended up with vasospasm and therefore we could not cannulate the RIGHT prostatic artery for safe embolization. The microcatheter and base catheter were retracted. Embolization was not performed on this side. CONE BEAM CT ANGIOGRAPHY: To utilize vascular tracking software, a cone beam CTA was then performed from the LEFT and RIGHT internal iliac arteries, and post processing  was performed at a separate workstation. PURPOSE OF THE ARTERIOGRAMS: No previous  catheter-directed angiogram was available. Therefore a new complete diagnostic angiography was performed. The decision to proceed with an interventional procedure was made based on this new diagnostic angiogram. ARTERIAL CLOSURE: At this point, all wires, catheters and sheaths were removed from the patient. The sheath and catheter were removed and hemostasis was achieved by Angio-Seal closure of the RIGHT common femoral artery access. The puncture site was be dressed in a sterile manner. The patient tolerated the procedure well without immediate post procedural complication. FINDINGS: - access via the RIGHT common femoral artery. - Prostatic arteries arising from the anterior division, gluteo-pudendal trunk (Type II) on the LEFT and off the obturator artery on the RIGHT (Type III) - Active extravasation into urinary bladder on LEFT prostate artery arteriogram, consistent with hematuria - Successful microparticle prostate artery embolization (PAE) and Gelfoam slurry, with cross filling of the RIGHT prostatic gland. - Vasospasm of the shared origin of RIGHT obturator artery, unable to embolize via the RIGHT prostatic artery - AngioSeal closure at the RIGHT groin with distal RLE pulses at the end of the case. IMPRESSION: 1. Successful LEFT prostatic artery microparticle embolization for refractory hematuria via trans femoral approach. 2. 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 Embolization was not performed on this side. PLAN: Continuous bladder irrigation (CBI) per Urology and continue to monitor for hematuria with serial H/h. Michaelle Birks, MD [facility] Radiology Electronically Signed   By: Michaelle Birks M.D.   On: 01/18/2022 08:59   IR 3D Independent Darreld Mclean  Result Date: 01/18/2022 INDICATION: Briefly, 76 year old [name] comorbid with history of prostate cancer s/p brachytherapy and AFib previously on anticoagulation who  presented with gross hematuria refractory to urologic intervention. EXAM: Procedures: 1. PROSTATE ARTERY EMBOLIZATION, LEFT 2. PELVIC ARTERIOGRAPHY and CONE BEAM CT ANGIOGRAPHY of the PELVIS MEDICATIONS: Rocephin 2 gm IV. The antibiotic was administered within one hour of the procedure ANESTHESIA/SEDATION: Moderate (conscious) sedation was employed during this procedure. A total of Versed 4.5 mg and Fentanyl 300 mcg was administered intravenously. Moderate Sedation Time: 154 minutes. The patient's level of consciousness and vital signs were monitored continuously by radiology nursing throughout the procedure under my direct supervision. CONTRAST:  106m OMNIPAQUE IOHEXOL 300 MG/ML SOLN, 271mOMNIPAQUE IOHEXOL 300 MG/ML SOLN, 2618mMNIPAQUE IOHEXOL 300 MG/ML SOLN, 40m25mNIPAQUE IOHEXOL 300 MG/ML SOLN FLUOROSCOPY TIME:  Fluoroscopic dose; 764 740 COMPLICATIONS: None immediate. PROCEDURE: Informed consent was obtained from the the patient and/or patient's representative following explanation of the procedure, risks, benefits and alternatives. The patient understands, agrees and consents for the procedure. All questions were addressed. A time out was performed prior to the initiation of the procedure. Maximal barrier sterile technique utilized including caps, mask, sterile gowns, sterile gloves, large sterile drape, hand hygiene, and Betadine prep. The RIGHT femoral head was marked fluoroscopically. Under sterile conditions and local anesthesia, the RIGHT common femoral artery access was performed with a micropuncture needle. Under direct ultrasound guidance, the right common femoral was accessed with a micropuncture kit. An ultrasound image was saved for documentation purposes. This allowed for placement of a 5 Fr vascular sheath. A limited arteriogram was performed through the side arm of the sheath confirming appropriate access within the RIGHT common femoral artery. The 5 Fr C2 catheter was utilized to select the  contralateral LEFT internal iliac artery then cone beam CT angiogram was performed, with post processing performed  at a separate workstation. Additionally, selective LEFT prostatic arteriogram was performed followed by catheterization with a 1.9 Fr 165 cm Progreat lambda microcatheter and 0.016 inch Fathom micro guide wire. Access was adequate for embolization. Embolization was performed with 100-300 um and 300-500 um embosphere micro particles followed by Gelfoam slurry. Active extravasation was noted from the LEFT prostate into the urinary bladder. Post embolization angiogram confirms stasis of the LEFT prostatic vascular territory. The 5 Fr catheter was retracted and using a Waltman loop was inserted into the ipsilateral/RIGHT internal iliac artery then cone beam CT angiogram was performed. Additionally selective RIGHT prostatic arteriogram was also performed followed by catheterization using microcatheter and microwire. A common origin of the RIGHT obturator artery and prostatic artery was present with a challenging posteromedial angle of origin. The common origin of the obturator and prostatic artery ended up with vasospasm and therefore we could not cannulate the RIGHT prostatic artery for safe embolization. The microcatheter and base catheter were retracted. Embolization was not performed on this side. CONE BEAM CT ANGIOGRAPHY: To utilize vascular tracking software, a cone beam CTA was then performed from the LEFT and RIGHT internal iliac arteries, and post processing was performed at a separate workstation. PURPOSE OF THE ARTERIOGRAMS: No previous catheter-directed angiogram was available. Therefore a new complete diagnostic angiography was performed. The decision to proceed with an interventional procedure was made based on this new diagnostic angiogram. ARTERIAL CLOSURE: At this point, all wires, catheters and sheaths were removed from the patient. The sheath and catheter were removed and hemostasis was  achieved by Angio-Seal closure of the RIGHT common femoral artery access. The puncture site was be dressed in a sterile manner. The patient tolerated the procedure well without immediate post procedural complication. FINDINGS: - access via the RIGHT common femoral artery. - Prostatic arteries arising from the anterior division, gluteo-pudendal trunk (Type II) on the LEFT and off the obturator artery on the RIGHT (Type III) - Active extravasation into urinary bladder on LEFT prostate artery arteriogram, consistent with hematuria - Successful microparticle prostate artery embolization (PAE) and Gelfoam slurry, with cross filling of the RIGHT prostatic gland. - Vasospasm of the shared origin of RIGHT obturator artery, unable to embolize via the RIGHT prostatic artery - AngioSeal closure at the RIGHT groin with distal RLE pulses at the end of the case. IMPRESSION: 1. Successful LEFT prostatic artery microparticle embolization for refractory hematuria via trans femoral approach. 2. 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 Embolization was not performed on this side. PLAN: Continuous bladder irrigation (CBI) per Urology and continue to monitor for hematuria with serial H/h. Michaelle Birks, MD [facility] Radiology Electronically Signed   By: Michaelle Birks M.D.   On: 01/18/2022 08:59   IR Angiogram Selective Each Additional Vessel  Result Date: 01/18/2022 INDICATION: Briefly, 76 year old [name] comorbid with history of prostate cancer s/p brachytherapy and AFib previously on anticoagulation who presented with gross hematuria refractory to urologic intervention. EXAM: Procedures: 1. PROSTATE ARTERY EMBOLIZATION, LEFT 2. PELVIC ARTERIOGRAPHY and CONE BEAM CT ANGIOGRAPHY of the PELVIS MEDICATIONS: Rocephin 2 gm IV. The antibiotic was administered within one hour of the procedure ANESTHESIA/SEDATION: Moderate (conscious) sedation was  employed during this procedure. A total of Versed 4.5 mg and Fentanyl 300 mcg was administered intravenously. Moderate Sedation Time: 154 minutes. The patient's level of consciousness and vital signs were monitored continuously by radiology nursing throughout the procedure under my direct supervision. CONTRAST:  85m  OMNIPAQUE IOHEXOL 300 MG/ML SOLN, 58m OMNIPAQUE IOHEXOL 300 MG/ML SOLN, 272mOMNIPAQUE IOHEXOL 300 MG/ML SOLN, 5021mMNIPAQUE IOHEXOL 300 MG/ML SOLN FLUOROSCOPY TIME:  Fluoroscopic dose; [id number] COMPLICATIONS: None immediate. PROCEDURE: Informed consent was obtained from the the patient and/or patient's representative following explanation of the procedure, risks, benefits and alternatives. The patient understands, agrees and consents for the procedure. All questions were addressed. A time out was performed prior to the initiation of the procedure. Maximal barrier sterile technique utilized including caps, mask, sterile gowns, sterile gloves, large sterile drape, hand hygiene, and Betadine prep. The RIGHT femoral head was marked fluoroscopically. Under sterile conditions and local anesthesia, the RIGHT common femoral artery access was performed with a micropuncture needle. Under direct ultrasound guidance, the right common femoral was accessed with a micropuncture kit. An ultrasound image was saved for documentation purposes. This allowed for placement of a 5 Fr vascular sheath. A limited arteriogram was performed through the side arm of the sheath confirming appropriate access within the RIGHT common femoral artery. The 5 Fr C2 catheter was utilized to select the contralateral LEFT internal iliac artery then cone beam CT angiogram was performed, with post processing performed at a separate workstation. Additionally, selective LEFT prostatic arteriogram was performed followed by catheterization with a 1.9 Fr 165 cm Progreat lambda microcatheter and 0.016 inch Fathom micro guide wire. Access was  adequate for embolization. Embolization was performed with 100-300 um and 300-500 um embosphere micro particles followed by Gelfoam slurry. Active extravasation was noted from the LEFT prostate into the urinary bladder. Post embolization angiogram confirms stasis of the LEFT prostatic vascular territory. The 5 Fr catheter was retracted and using a Waltman loop was inserted into the ipsilateral/RIGHT internal iliac artery then cone beam CT angiogram was performed. Additionally selective RIGHT prostatic arteriogram was also performed followed by catheterization using microcatheter and microwire. A common origin of the RIGHT obturator artery and prostatic artery was present with a challenging posteromedial angle of origin. The common origin of the obturator and prostatic artery ended up with vasospasm and therefore we could not cannulate the RIGHT prostatic artery for safe embolization. The microcatheter and base catheter were retracted. Embolization was not performed on this side. CONE BEAM CT ANGIOGRAPHY: To utilize vascular tracking software, a cone beam CTA was then performed from the LEFT and RIGHT internal iliac arteries, and post processing was performed at a separate workstation. PURPOSE OF THE ARTERIOGRAMS: No previous catheter-directed angiogram was available. Therefore a new complete diagnostic angiography was performed. The decision to proceed with an interventional procedure was made based on this new diagnostic angiogram. ARTERIAL CLOSURE: At this point, all wires, catheters and sheaths were removed from the patient. The sheath and catheter were removed and hemostasis was achieved by Angio-Seal closure of the RIGHT common femoral artery access. The puncture site was be dressed in a sterile manner. The patient tolerated the procedure well without immediate post procedural complication. FINDINGS: - access via the RIGHT common femoral artery. - Prostatic arteries arising from the anterior division,  gluteo-pudendal trunk (Type II) on the LEFT and off the obturator artery on the RIGHT (Type III) - Active extravasation into urinary bladder on LEFT prostate artery arteriogram, consistent with hematuria - Successful microparticle prostate artery embolization (PAE) and Gelfoam slurry, with cross filling of the RIGHT prostatic gland. - Vasospasm of the shared origin of RIGHT obturator artery, unable to embolize via the RIGHT prostatic artery - AngioSeal closure at the RIGHT groin with distal RLE pulses at the  end of the case. IMPRESSION: 1. Successful LEFT prostatic artery microparticle embolization for refractory hematuria via trans femoral approach. 2. 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 Embolization was not performed on this side. PLAN: Continuous bladder irrigation (CBI) per Urology and continue to monitor for hematuria with serial H/h. Michaelle Birks, MD [facility] Radiology Electronically Signed   By: Michaelle Birks M.D.   On: 01/18/2022 08:59   IR Angiogram Selective Each Additional Vessel  Result Date: 01/18/2022 INDICATION: Briefly, 76 year old [name] comorbid with history of prostate cancer s/p brachytherapy and AFib previously on anticoagulation who presented with gross hematuria refractory to urologic intervention. EXAM: Procedures: 1. PROSTATE ARTERY EMBOLIZATION, LEFT 2. PELVIC ARTERIOGRAPHY and CONE BEAM CT ANGIOGRAPHY of the PELVIS MEDICATIONS: Rocephin 2 gm IV. The antibiotic was administered within one hour of the procedure ANESTHESIA/SEDATION: Moderate (conscious) sedation was employed during this procedure. A total of Versed 4.5 mg and Fentanyl 300 mcg was administered intravenously. Moderate Sedation Time: 154 minutes. The patient's level of consciousness and vital signs were monitored continuously by radiology nursing throughout the procedure under my direct supervision. CONTRAST:  57m OMNIPAQUE  IOHEXOL 300 MG/ML SOLN, 297mOMNIPAQUE IOHEXOL 300 MG/ML SOLN, 2658mMNIPAQUE IOHEXOL 300 MG/ML SOLN, 13m67mNIPAQUE IOHEXOL 300 MG/ML SOLN FLUOROSCOPY TIME:  Fluoroscopic dose; 764 628 COMPLICATIONS: None immediate. PROCEDURE: Informed consent was obtained from the the patient and/or patient's representative following explanation of the procedure, risks, benefits and alternatives. The patient understands, agrees and consents for the procedure. All questions were addressed. A time out was performed prior to the initiation of the procedure. Maximal barrier sterile technique utilized including caps, mask, sterile gowns, sterile gloves, large sterile drape, hand hygiene, and Betadine prep. The RIGHT femoral head was marked fluoroscopically. Under sterile conditions and local anesthesia, the RIGHT common femoral artery access was performed with a micropuncture needle. Under direct ultrasound guidance, the right common femoral was accessed with a micropuncture kit. An ultrasound image was saved for documentation purposes. This allowed for placement of a 5 Fr vascular sheath. A limited arteriogram was performed through the side arm of the sheath confirming appropriate access within the RIGHT common femoral artery. The 5 Fr C2 catheter was utilized to select the contralateral LEFT internal iliac artery then cone beam CT angiogram was performed, with post processing performed at a separate workstation. Additionally, selective LEFT prostatic arteriogram was performed followed by catheterization with a 1.9 Fr 165 cm Progreat lambda microcatheter and 0.016 inch Fathom micro guide wire. Access was adequate for embolization. Embolization was performed with 100-300 um and 300-500 um embosphere micro particles followed by Gelfoam slurry. Active extravasation was noted from the LEFT prostate into the urinary bladder. Post embolization angiogram confirms stasis of the LEFT prostatic vascular territory. The 5 Fr catheter was  retracted and using a Waltman loop was inserted into the ipsilateral/RIGHT internal iliac artery then cone beam CT angiogram was performed. Additionally selective RIGHT prostatic arteriogram was also performed followed by catheterization using microcatheter and microwire. A common origin of the RIGHT obturator artery and prostatic artery was present with a challenging posteromedial angle of origin. The common origin of the obturator and prostatic artery ended up with vasospasm and therefore we could not cannulate the RIGHT prostatic artery for safe embolization. The microcatheter and base catheter were retracted. Embolization was not performed on this side. CONE BEAM CT ANGIOGRAPHY: To utilize vascular tracking software, a cone beam CTA was then performed from  the LEFT and RIGHT internal iliac arteries, and post processing was performed at a separate workstation. PURPOSE OF THE ARTERIOGRAMS: No previous catheter-directed angiogram was available. Therefore a new complete diagnostic angiography was performed. The decision to proceed with an interventional procedure was made based on this new diagnostic angiogram. ARTERIAL CLOSURE: At this point, all wires, catheters and sheaths were removed from the patient. The sheath and catheter were removed and hemostasis was achieved by Angio-Seal closure of the RIGHT common femoral artery access. The puncture site was be dressed in a sterile manner. The patient tolerated the procedure well without immediate post procedural complication. FINDINGS: - access via the RIGHT common femoral artery. - Prostatic arteries arising from the anterior division, gluteo-pudendal trunk (Type II) on the LEFT and off the obturator artery on the RIGHT (Type III) - Active extravasation into urinary bladder on LEFT prostate artery arteriogram, consistent with hematuria - Successful microparticle prostate artery embolization (PAE) and Gelfoam slurry, with cross filling of the RIGHT prostatic gland. -  Vasospasm of the shared origin of RIGHT obturator artery, unable to embolize via the RIGHT prostatic artery - AngioSeal closure at the RIGHT groin with distal RLE pulses at the end of the case. IMPRESSION: 1. Successful LEFT prostatic artery microparticle embolization for refractory hematuria via trans femoral approach. 2. 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 Embolization was not performed on this side. PLAN: Continuous bladder irrigation (CBI) per Urology and continue to monitor for hematuria with serial H/h. Michaelle Birks, MD [facility] Radiology Electronically Signed   By: Michaelle Birks M.D.   On: 01/18/2022 08:59   IR EMBO ART  VEN HEMORR LYMPH EXTRAV  INC GUIDE ROADMAPPING  Result Date: 01/18/2022 INDICATION: Briefly, 76 year old [name] comorbid with history of prostate cancer s/p brachytherapy and AFib previously on anticoagulation who presented with gross hematuria refractory to urologic intervention. EXAM: Procedures: 1. PROSTATE ARTERY EMBOLIZATION, LEFT 2. PELVIC ARTERIOGRAPHY and CONE BEAM CT ANGIOGRAPHY of the PELVIS MEDICATIONS: Rocephin 2 gm IV. The antibiotic was administered within one hour of the procedure ANESTHESIA/SEDATION: Moderate (conscious) sedation was employed during this procedure. A total of Versed 4.5 mg and Fentanyl 300 mcg was administered intravenously. Moderate Sedation Time: 154 minutes. The patient's level of consciousness and vital signs were monitored continuously by radiology nursing throughout the procedure under my direct supervision. CONTRAST:  41m OMNIPAQUE IOHEXOL 300 MG/ML SOLN, 264mOMNIPAQUE IOHEXOL 300 MG/ML SOLN, 2680mMNIPAQUE IOHEXOL 300 MG/ML SOLN, 60m18mNIPAQUE IOHEXOL 300 MG/ML SOLN FLUOROSCOPY TIME:  Fluoroscopic dose; 764 119 COMPLICATIONS: None immediate. PROCEDURE: Informed consent was obtained from the the patient and/or patient's representative following  explanation of the procedure, risks, benefits and alternatives. The patient understands, agrees and consents for the procedure. All questions were addressed. A time out was performed prior to the initiation of the procedure. Maximal barrier sterile technique utilized including caps, mask, sterile gowns, sterile gloves, large sterile drape, hand hygiene, and Betadine prep. The RIGHT femoral head was marked fluoroscopically. Under sterile conditions and local anesthesia, the RIGHT common femoral artery access was performed with a micropuncture needle. Under direct ultrasound guidance, the right common femoral was accessed with a micropuncture kit. An ultrasound image was saved for documentation purposes. This allowed for placement of a 5 Fr vascular sheath. A limited arteriogram was performed through the side arm of the sheath confirming appropriate access within the RIGHT common femoral artery. The 5 Fr C2 catheter was utilized to select  the contralateral LEFT internal iliac artery then cone beam CT angiogram was performed, with post processing performed at a separate workstation. Additionally, selective LEFT prostatic arteriogram was performed followed by catheterization with a 1.9 Fr 165 cm Progreat lambda microcatheter and 0.016 inch Fathom micro guide wire. Access was adequate for embolization. Embolization was performed with 100-300 um and 300-500 um embosphere micro particles followed by Gelfoam slurry. Active extravasation was noted from the LEFT prostate into the urinary bladder. Post embolization angiogram confirms stasis of the LEFT prostatic vascular territory. The 5 Fr catheter was retracted and using a Waltman loop was inserted into the ipsilateral/RIGHT internal iliac artery then cone beam CT angiogram was performed. Additionally selective RIGHT prostatic arteriogram was also performed followed by catheterization using microcatheter and microwire. A common origin of the RIGHT obturator artery and  prostatic artery was present with a challenging posteromedial angle of origin. The common origin of the obturator and prostatic artery ended up with vasospasm and therefore we could not cannulate the RIGHT prostatic artery for safe embolization. The microcatheter and base catheter were retracted. Embolization was not performed on this side. CONE BEAM CT ANGIOGRAPHY: To utilize vascular tracking software, a cone beam CTA was then performed from the LEFT and RIGHT internal iliac arteries, and post processing was performed at a separate workstation. PURPOSE OF THE ARTERIOGRAMS: No previous catheter-directed angiogram was available. Therefore a new complete diagnostic angiography was performed. The decision to proceed with an interventional procedure was made based on this new diagnostic angiogram. ARTERIAL CLOSURE: At this point, all wires, catheters and sheaths were removed from the patient. The sheath and catheter were removed and hemostasis was achieved by Angio-Seal closure of the RIGHT common femoral artery access. The puncture site was be dressed in a sterile manner. The patient tolerated the procedure well without immediate post procedural complication. FINDINGS: - access via the RIGHT common femoral artery. - Prostatic arteries arising from the anterior division, gluteo-pudendal trunk (Type II) on the LEFT and off the obturator artery on the RIGHT (Type III) - Active extravasation into urinary bladder on LEFT prostate artery arteriogram, consistent with hematuria - Successful microparticle prostate artery embolization (PAE) and Gelfoam slurry, with cross filling of the RIGHT prostatic gland. - Vasospasm of the shared origin of RIGHT obturator artery, unable to embolize via the RIGHT prostatic artery - AngioSeal closure at the RIGHT groin with distal RLE pulses at the end of the case. IMPRESSION: 1. Successful LEFT prostatic artery microparticle embolization for refractory hematuria via trans femoral approach.  2. 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 Embolization was not performed on this side. PLAN: Continuous bladder irrigation (CBI) per Urology and continue to monitor for hematuria with serial H/h. Michaelle Birks, MD [facility] Radiology Electronically Signed   By: Michaelle Birks M.D.   On: 01/18/2022 08:59    Labs:  CBC: Recent Labs    01/14/22 1355 07/Nathan/23 0457 01/17/22 0527 01/18/22 0814  WBC 12.4* 7.5 6.7 9.6  HGB 9.2* 7.9* 7.4* 7.6*  HCT 30.3* 25.5* 25.4* 24.6*  PLT 215 202 209 217    COAGS: Recent Labs    01/17/22 0527  INR 1.2    BMP: Recent Labs    01/11/22 2000 01/13/22 0737 01/17/22 0527 01/18/22 0814  NA 142 140 141 139  K 4.2 4.1 4.4 4.4  CL 111 110 110 107  CO2 _0 GLUCOSE 125* 96 104* 119*  BUN _0 CALCIUM 8.6* 8.3* 8.5* 8.3*  CREATININE 1.04 0.97 0.98 1.25*  GFRNONAA >60 >60 >60 59*    LIVER FUNCTION TESTS: Recent Labs    12/09/21 1105 12/10/21 0234  BILITOT 0.7 0.8  AST Nathan 16  ALT 9 10  ALKPHOS 67 53  PROT 7.1 5.8*  ALBUMIN 3.5 2.7*    Assessment and Plan:  Hematuria --related to history of Prostate cancer --s/p embolization yesterday, urine is now without clots on exam --expect continued clearing of urine in days to come. Will defer to Urology for further plans and follow up  IR remains available as needed.  Electronically Signed: Pasty Spillers, PA 01/18/2022, 4:20 PM   I spent a total of 15 Minutes at the the patient's bedside AND on the patient's hospital floor or unit, greater than 50% of which was counseling/coordinating care for hematuria

## 2022-01-18 NOTE — Progress Notes (Addendum)
  S: s/p cystoscopy and bladder neck fulguration with clot evacuation and prostate artery embolization yesterday. Doing well this AM, urine watermelon colored, thin, on a moderate drip CBI. Awaiting AM labs  O: . Vitals:   01/18/22 0100 01/18/22 0623  BP: 120/74 123/79  Pulse: (!) 101 91  Resp: 17 16  Temp: 98.6 F (37 C) 99.1 F (37.3 C)  SpO2: 100% 100%   .CBC    Component Value Date/Time   WBC 6.7 01/17/2022 0527   RBC 2.89 (L) 01/17/2022 0527   HGB 7.4 (L) 01/17/2022 0527   HCT 25.4 (L) 01/17/2022 0527   PLT 209 01/17/2022 0527   MCV 87.9 01/17/2022 0527   MCH 25.6 (L) 01/17/2022 0527   MCHC 29.1 (L) 01/17/2022 0527   RDW 17.7 (H) 01/17/2022 0527   LYMPHSABS 0.8 01/11/2022 2000   MONOABS 0.7 01/11/2022 2000   EOSABS 0.2 01/11/2022 2000   BASOSABS 0.0 01/11/2022 2000   . Lab Results  Component Value Date   CREATININE 0.98 01/17/2022   CREATININE 0.97 01/13/2022   CREATININE 1.04 01/11/2022   NAD CV - RRR Resp - reg effort and depth Abd- soft, NT SP tube - urine red, purple Ext - no calf pain or swelling.   A/P: BPH, gross hematuria, h/o PCa s/p XRT -   Continue CBI and CBC monitoring. Expect to see additional benefit from PAE in the coming days. Urine much more thin compared to pre-procedures which is reassuring.  Attending attestation: Patient was seen and examined on rounds this afternoon.  He was on a very slow CBI drip with dark red urine. I turned the CBI up and it immediately cleared.  Seems to be much improvement.  We will follow hemoglobin to make sure it remains stable and hopefully he can get home in the next day or 2.  I discussed with Dr. Denyse Dago and agree with his assessment and plan.  Appreciate excellent TRH care.

## 2022-01-18 NOTE — Progress Notes (Signed)
Physical Therapy Treatment Patient Details Name: Nathan [name] MRN: [id number] DOB: 1946/01/30 Today's Date: 01/18/2022   History of Present Illness 76 y.o. male past medical history significant for prostate cancer status post radioactive seed implant with suprapubic catheter and chronic hematuria, chronic atrial fibrillation, diabetes mellitus type 2, recent history of DVT status post IVC filter on 12/10/21 who comes in for gross hematuria and admitted for Acute blood loss anemia/Symptomatic anemia secondary to gross hematuria possibly due to unfortunate sequel of radiation to the prostate. S/P cystoscopy and fulgration of prostate 01/17/22    PT Comments    The patient agreed to short ambulation in room, patient now  has CBI . Patient steady on RW. Continue PT  and mobility.    Recommendations for follow up therapy are one component of a multi-disciplinary discharge planning process, led by the attending physician.  Recommendations may be updated based on patient status, additional functional criteria and insurance authorization.  Follow Up Recommendations  No PT follow up     Assistance Recommended at Discharge PRN  Patient can return home with the following A little help with walking and/or transfers;Help with stairs or ramp for entrance   Equipment Recommendations  None recommended by PT    Recommendations for Other Services       Precautions / Restrictions Precautions Precautions: Fall Precaution Comments: bladder irrigation, suprpubic catheter     Mobility  Bed Mobility   Bed Mobility: Supine to Sit, Sit to Supine     Supine to sit: Supervision Sit to supine: Supervision   General bed mobility comments: extra time, assist with lines    Transfers Overall transfer level: Needs assistance Equipment used: Rolling walker (2 wheels) Transfers: Sit to/from Stand Sit to Stand: Min guard                Ambulation/Gait Ambulation/Gait assistance: Min guard Gait  Distance (Feet): 20 Feet Assistive device: Rolling walker (2 wheels) Gait Pattern/deviations: Step-through pattern, Decreased stride length, Trunk flexed       General Gait Details: verbal cues for posture, RW positioning; pt denies dizziness, pt self limiting distance   Stairs             Wheelchair Mobility    Modified Rankin (Stroke Patients Only)       Balance Overall balance assessment: Needs assistance   Sitting balance-Leahy Scale: Good     Standing balance support: Reliant on assistive device for balance   Standing balance comment: utilized RW for ambulation                            Cognition Arousal/Alertness: Awake/alert Behavior During Therapy: WFL for tasks assessed/performed                                            Exercises      General Comments        Pertinent Vitals/Pain Pain Assessment Pain Assessment: No/denies pain    Home Living                          Prior Function            PT Goals (current goals can now be found in the care plan section) Progress towards PT goals: Progressing toward goals    Frequency  Min 3X/week      PT Plan Current plan remains appropriate    Co-evaluation              AM-PAC PT "6 Clicks" Mobility   Outcome Measure  Help needed turning from your back to your side while in a flat bed without using bedrails?: None Help needed moving from lying on your back to sitting on the side of a flat bed without using bedrails?: None Help needed moving to and from a bed to a chair (including a wheelchair)?: A Little Help needed standing up from a chair using your arms (e.g., wheelchair or bedside chair)?: A Little Help needed to walk in hospital room?: A Little Help needed climbing 3-5 steps with a railing? : A Little 6 Click Score: 20    End of Session   Activity Tolerance: Patient tolerated treatment well Patient left: with call bell/phone  within reach;in bed Nurse Communication: Mobility status PT Visit Diagnosis: Difficulty in walking, not elsewhere classified (R26.2)     Time: 0454-0981 PT Time Calculation (min) (ACUTE ONLY): 11 min  Charges:  $Gait Training: 8-22 mins                     [place] Office [phone] Weekend pager-[phone]    Claretha Cooper 01/18/2022, 4:30 PM

## 2022-01-19 DIAGNOSIS — D649 Anemia, unspecified: Secondary | ICD-10-CM | POA: Diagnosis not present

## 2022-01-19 LAB — HEMOGLOBIN AND HEMATOCRIT, BLOOD
HCT: 22.7 % — ABNORMAL LOW (ref 39.0–52.0)
HCT: 25.3 % — ABNORMAL LOW (ref 39.0–52.0)
Hemoglobin: 6.9 g/dL — CL (ref 13.0–17.0)
Hemoglobin: 8 g/dL — ABNORMAL LOW (ref 13.0–17.0)

## 2022-01-19 LAB — GLUCOSE, CAPILLARY
Glucose-Capillary: 101 mg/dL — ABNORMAL HIGH (ref 70–99)
Glucose-Capillary: 111 mg/dL — ABNORMAL HIGH (ref 70–99)
Glucose-Capillary: 112 mg/dL — ABNORMAL HIGH (ref 70–99)
Glucose-Capillary: 201 mg/dL — ABNORMAL HIGH (ref 70–99)

## 2022-01-19 LAB — BASIC METABOLIC PANEL
Anion gap: 5 (ref 5–15)
BUN: 21 mg/dL (ref 8–23)
CO2: 26 mmol/L (ref 22–32)
Calcium: 8.3 mg/dL — ABNORMAL LOW (ref 8.9–10.3)
Chloride: 108 mmol/L (ref 98–111)
Creatinine, Ser: 1.15 mg/dL (ref 0.61–1.24)
GFR, Estimated: >60 mL/min (ref >60)
Glucose, Bld: 111 mg/dL — ABNORMAL HIGH (ref 70–99)
Potassium: 4.7 mmol/L (ref 3.5–5.1)
Sodium: 139 mmol/L (ref 135–145)

## 2022-01-19 LAB — PREPARE RBC (CROSSMATCH)

## 2022-01-19 MED ORDER — SODIUM CHLORIDE 0.9% IV SOLUTION: Freq: Once | INTRAVENOUS | Status: AC

## 2022-01-19 NOTE — Progress Notes (Signed)
PROGRESS NOTE  Nathan [name] ZOX:[id number] DOB: 1945/12/31 DOA: 01/11/2022 PCP: [name], MD  Brief History   Nathan [name] is an 76 y.o. male past medical history significant for prostate cancer status post radioactive seed implant with suprapubic catheter and chronic hematuria, chronic atrial fibrillation, diabetes mellitus type 2 recent history of DVT status post IVC filter who comes in for gross hematuria patient is a limited historian and most of the history was obtained from the daughter, went to his PCP his hemoglobin was 6 was sent to the ED in the ED, it was 5.  The patient was transfused with 4 units of PRBC's. Urology was consulted. The patient underwent cystoscopy and fulgration of prostate yesterday am. However, it was clear that Prastatic artery embolization would be necessary. The patient underwent this procedure on 01/17/2022. He tolerated the procedure well and is currently on CBI. The fluid in the receiving bag is red.   Consultants  Urology Interventional Radiology  Procedures  Cystoscopy with fulguration of bleeding prostate. Prostatic artery embolization  Antibiotics   Anti-infectives (From admission, onward)    Start     Dose/Rate Route Frequency Ordered Stop   01/17/22 1415  cefTRIAXone (ROCEPHIN) 2 g in sodium chloride 0.9 % 100 mL IVPB        2 g 200 mL/hr over 30 Minutes Intravenous  Once 01/17/22 1403 01/17/22 1430   01/17/22 0700  ceFAZolin (ANCEF) IVPB 2g/100 mL premix        2 g 200 mL/hr over 30 Minutes Intravenous  Once 01/17/22 0659 01/17/22 0812   01/12/22 0100  cefTRIAXone (ROCEPHIN) 1 g in sodium chloride 0.9 % 100 mL IVPB  Status:  Discontinued        1 g 200 mL/hr over 30 Minutes Intravenous Every 24 hours 01/12/22 0050 01/13/22 1329      Interval History/Subjective  The patient is seen following his procedure. He is complaining of nausea. Otherwise he has tolerated the procedure well.   Objective   Vitals:  Vitals:   01/19/22  0916 01/19/22 1340  BP: 115/77 109/73  Pulse: 89 82  Resp: 16 16  Temp: 99.2 F (37.3 C) 99.1 F (37.3 C)  SpO2: 98% 98%    Exam:  Constitutional:  Appears calm and comfortable Respiratory:  CTA bilaterally, no w/r/r.  Respiratory effort normal. No retractions or accessory muscle use Cardiovascular:  RRR, no m/r/g No LE extremity edema   Normal pedal pulses Abdomen:  Abdomen appears normal; no tenderness or masses No hernias No HSM Musculoskeletal:  Digits/nails BUE: no clubbing, cyanosis, petechiae, infection exam of joints, bones, muscles of at least one of following: head/neck, RUE, LUE, RLE, LLE   strength and tone normal, no atrophy, no abnormal movements No tenderness, masses Normal ROM, no contractures  gait and station Skin:  No rashes, lesions, ulcers palpation of skin: no induration or nodules Neurologic:  CN 2-12 intact Sensation all 4 extremities intact Psychiatric:  Mental status Mood, affect appropriate Orientation to person, place, time  judgment and insight appear intact     I have personally reviewed the following:   Today's Data  Vitals  Lab Data  CBC BMP  Micro Data  Urine culture from 01/11/2022 had insignificant growth  Imaging  CTA abdomen and pelvis  Cardiology Data    Other Data    Scheduled Meds:  Chlorhexidine Gluconate Cloth  6 each Topical Daily   finasteride  5 mg Oral Daily   folic acid  1 mg Oral  Daily   gabapentin  300 mg Oral TID   insulin aspart  0-15 Units Subcutaneous TID WC   insulin aspart  0-5 Units Subcutaneous QHS   insulin aspart  4 Units Subcutaneous TID WC   lisinopril  40 mg Oral Daily   magnesium oxide  400 mg Oral Daily   metoprolol succinate  100 mg Oral Daily   vitamin B-12  1,000 mcg Oral Daily   Continuous Infusions:  sodium chloride      Principal Problem:   Symptomatic anemia Active Problems:   Atrial fibrillation (HCC)   Malignant neoplasm of prostate (HCC)   Hematuria   HTN  (hypertension)   DM2 (diabetes mellitus, type 2) (HCC)   History of DVT (deep vein thrombosis)   Gross hematuria   LOS: 7 days   A & P  Symptomatic anemia: Patient presented with a hemoglobin of 4. He has received 4 units of PRBC's in transfusion. Hemoglobin this am has dropped nominally to 7.6.  Atrial Fibrillation: Rate is controlled and is ranging from 87 - 112. Anticoagulation is held due to acute bleeding and severe anemia.  Acute hematuria: The patient is bleeding from the prostate. He has undergone PAE on 01/17/2022. He has tolerated the procedure well. Treatment as per urology and interventional radiology. The patient is currently on CBI. Urine in the foley bag is watermelon colored.  Malignant Neoplasm of the Prostate: Noted Treatment as per urology.  DM II: Glucoses are being addressed with 4 units Novolog with meals and FSBS followed with SSI. Glucoses for the past 24 hours have been 101-128.  History of DVT: S/P IVC filter.  Hypertension: Blood pressures have been normotensive on lisinopril 40 mg daily and metoprolol succinate 100 mg daily.  I have seen and examined this patient myself. I have spent 34 minutes in his evaluation and care.  DVT prophylaxis: SCD's Code Status: Full Code Family Communication: Spouse is at bedside. Disposition Plan: Home    Zailyn Rowser, DO [facility] Direct contact: see [email or web address]  7PM-7AM contact night coverage as above 01/18/2022, 3:46 PM  LOS: 5 days

## 2022-01-19 NOTE — Progress Notes (Signed)
Critical Value HGB 6.9, Primary RN updated and to update hospitalist provider.

## 2022-01-19 NOTE — Progress Notes (Signed)
PROGRESS NOTE  Nathan [name] TIR:[id number] DOB: 06-Dec-1945 DOA: 01/11/2022 PCP: [name], MD  Brief History   Nathan [name] is an 76 y.o. male past medical history significant for prostate cancer status post radioactive seed implant with suprapubic catheter and chronic hematuria, chronic atrial fibrillation, diabetes mellitus type 2 recent history of DVT status post IVC filter who comes in for gross hematuria patient is a limited historian and most of the history was obtained from the daughter, went to his PCP his hemoglobin was 6 was sent to the ED in the ED, it was 5.  The patient was transfused with 4 units of PRBC's. Urology was consulted. The patient underwent cystoscopy and fulgration of prostate yesterday am. However, it was clear that Prastatic artery embolization would be necessary. The patient underwent this procedure on 01/17/2022. He tolerated the procedure well and is currently on CBI. The fluid in the receiving bag is red.   The patient's hemoglobin dropped to 6.9 this morning. He has received 1 unit PRBC's in transfusion. His hemoglobin has come up to 8.0. Urology has clamped his CBI.   Consultants  Urology Interventional Radiology  Procedures  Cystoscopy with fulguration of bleeding prostate. Prostatic artery embolization  Antibiotics   Anti-infectives (From admission, onward)    Start     Dose/Rate Route Frequency Ordered Stop   01/17/22 1415  cefTRIAXone (ROCEPHIN) 2 g in sodium chloride 0.9 % 100 mL IVPB        2 g 200 mL/hr over 30 Minutes Intravenous  Once 01/17/22 1403 01/17/22 1430   01/17/22 0700  ceFAZolin (ANCEF) IVPB 2g/100 mL premix        2 g 200 mL/hr over 30 Minutes Intravenous  Once 01/17/22 0659 01/17/22 0812   01/12/22 0100  cefTRIAXone (ROCEPHIN) 1 g in sodium chloride 0.9 % 100 mL IVPB  Status:  Discontinued        1 g 200 mL/hr over 30 Minutes Intravenous Every 24 hours 01/12/22 0050 01/13/22 1329      Interval History/Subjective  The  patient is resting comfortably. No new complaints.  Objective   Vitals:  Vitals:   01/19/22 0916 01/19/22 1340  BP: 115/77 109/73  Pulse: 89 82  Resp: 16 16  Temp: 99.2 F (37.3 C) 99.1 F (37.3 C)  SpO2: 98% 98%    Exam:  Constitutional:  The patient is awake, alert, and oriented x 3. No acute distress. Respiratory:  No increased work of breathing. No wheezes, rales, or rhonchi No tactile fremitus Cardiovascular:  Regular rate and rhythm No murmurs, ectopy, or gallups. No lateral PMI. No thrills. Abdomen:  Abdomen is soft, non-tender, non-distended No hernias, masses, or organomegaly Normoactive bowel sounds.  Musculoskeletal:  No cyanosis, clubbing, or edema Skin:  No rashes, lesions, ulcers palpation of skin: no induration or nodules Neurologic:  CN 2-12 intact Sensation all 4 extremities intact Psychiatric:  Mental status Mood, affect appropriate Orientation to person, place, time  judgment and insight appear intact  I have personally reviewed the following:   Today's Data  Vitals  Lab Data  CBC BMP  Micro Data  Urine culture from 01/11/2022 had insignificant growth  Imaging  CTA abdomen and pelvis  Cardiology Data    Other Data    Scheduled Meds:  Chlorhexidine Gluconate Cloth  6 each Topical Daily   finasteride  5 mg Oral Daily   folic acid  1 mg Oral Daily   gabapentin  300 mg Oral TID   insulin aspart  0-15 Units Subcutaneous TID WC   insulin aspart  0-5 Units Subcutaneous QHS   insulin aspart  4 Units Subcutaneous TID WC   lisinopril  40 mg Oral Daily   magnesium oxide  400 mg Oral Daily   metoprolol succinate  100 mg Oral Daily   vitamin B-12  1,000 mcg Oral Daily   Continuous Infusions:  sodium chloride      Principal Problem:   Symptomatic anemia Active Problems:   Atrial fibrillation (HCC)   Malignant neoplasm of prostate (HCC)   Hematuria   HTN (hypertension)   DM2 (diabetes mellitus, type 2) (HCC)   History of  DVT (deep vein thrombosis)   Gross hematuria   LOS: 7 days   A & P  Symptomatic anemia: Patient presented with a hemoglobin of 4. He has received 4 units of PRBC's in transfusion. Hemoglobin this am has dropped to 6.9. He is receiving 1 unit of PRBC's. Post transfusion hemoglobin is 8.0. Continue to monitor.  Atrial Fibrillation: Rate is controlled and is ranging from 87 - 112. Anticoagulation is held due to acute bleeding and severe anemia.  Acute hematuria: The patient is bleeding from the prostate. He has undergone PAE on 01/17/2022. He has tolerated the procedure well. Treatment as per urology and interventional radiology. Urology has clamped CBI and is monitoring for further bleeding.  Malignant Neoplasm of the Prostate: Noted Treatment as per urology.  DM II: Glucoses are being addressed with 4 units Novolog with meals and FSBS followed with SSI. Glucoses for the past 24 hours have been 101-128.  History of DVT: S/P IVC filter.  Hypertension: Blood pressures have been normotensive on lisinopril 40 mg daily and metoprolol succinate 100 mg daily.  I have seen and examined this patient myself. I have spent 32 minutes in his evaluation and care.  DVT prophylaxis: SCD's Code Status: Full Code Family Communication: Spouse is at bedside. Disposition Plan: Home    Thurmond Hildebran, DO [facility] Direct contact: see [email or web address]  7PM-7AM contact night coverage as above 01/19/2022, 5:07 PM  LOS: 5 days

## 2022-01-19 NOTE — Progress Notes (Signed)
PT Cancellation Note  Patient Details Name: Deryl Giroux MRN: [id number] DOB: 09/05/1945   Cancelled Treatment:    Reason Eval/Treat Not Completed: Medical issues which prohibited therapy, Patient receiving blood  this AM. Will check back another  time.[place] Office [phone] Weekend [phone]    Claretha Cooper 01/19/2022, 1:29 PM

## 2022-01-19 NOTE — Progress Notes (Addendum)
  S: No acute events overnight. Urine remains clear pink on a slow drip CBI this AM. Clamped in room and very minimal change in urine character over 5-10 minutes. Hgb 6.9 this AM from 7.6, asymptomatic, getting 1 unit now.  O: . Vitals:   01/19/22 3500 01/19/22 0640  BP: 101/70 103/68  Pulse:    Resp: 15 12  Temp: 98.5 F (36.9 C) 98.8 F (37.1 C)  SpO2: 100% 100%   .CBC    Component Value Date/Time   WBC 9.6 01/18/2022 0814   RBC 2.84 (L) 01/18/2022 0814   HGB 6.9 (LL) 01/19/2022 0424   HCT 22.7 (L) 01/19/2022 0424   PLT 217 01/18/2022 0814   MCV 86.6 01/18/2022 0814   MCH 26.8 01/18/2022 0814   MCHC 30.9 01/18/2022 0814   RDW 17.1 (H) 01/18/2022 0814   LYMPHSABS 0.8 01/18/2022 0814   MONOABS 1.1 (H) 01/18/2022 0814   EOSABS 0.0 01/18/2022 0814   BASOSABS 0.0 01/18/2022 0814   . Lab Results  Component Value Date   CREATININE 1.15 01/19/2022   CREATININE 1.25 (H) 01/18/2022   CREATININE 0.98 01/17/2022   NAD CV - RRR Resp - reg effort and depth Abd- soft, NT, SP tube site well appearing. Urethral catheter draining clear pink urine with CBI off Ext - no calf pain or swelling.   A/P: BPH, gross hematuria, h/o PCa s/p XRT -   Continue CBI clamped this morning, will re-evaluate in the afternoon. If urine remains clear, will consider removing urethral catheter this afternoon as this is likely causing some prostatic irritation.   Addendum: He was seen and examined on rounds this afternoon. CBI off/foley out. Urine light red to pink in tubing and see through. Looks good. Getting a unit but I think he's just catching up. No clinically significant ongoing GU bleeding. Home in AM OK from GU point of view if urine remains light and h/h stable.

## 2022-01-19 NOTE — TOC Initial Note (Signed)
Transition of Care [facility]) - Initial/Assessment Note    Patient Details  Name: Nathan [name] MRN: [id number] Date of Birth: Jul 09, 1945  [facility] CM/SW Contact:    Dessa Phi, RN Phone Number: 01/19/2022, 2:12 PM  Clinical Narrative: spoke to dtr [place]-d/c plans return home w/continued HHC-currently active w/[facility] HHRN-suprapubic cath care;HHPT. Noted PT recc no f/u. Will need resumption HHC orders if appropriate. Has own transport home.                  Expected Discharge Plan: [place] Barriers to Discharge: Continued Medical Work up   Patient Goals and CMS Choice Patient states their goals for this hospitalization and ongoing recovery are:: Home CMS Medicare.gov Compare Post Acute Care list provided to:: Patient Represenative (must comment) ([place] dtr) Choice offered to / list presented to : Adult Children  Expected Discharge Plan and Services Expected Discharge Plan: [place]   Discharge Planning Services: CM Consult Post Acute Care Choice: [place] arrangements for the past 2 months: Single Family Home                                      Prior Living Arrangements/Services Living arrangements for the past 2 months: Single Family Home Lives with:: Adult Children Patient language and need for interpreter reviewed:: Yes Do you feel safe going back to the place where you live?: Yes      Need for Family Participation in Patient Care: Yes (Comment) Care giver support system in place?: Yes (comment) Current home services: DME, Home PT, Home RN (rollator;Active w/[facility] RN-chronic f/c;HHPT) Criminal Activity/Legal Involvement Pertinent to Current Situation/Hospitalization: No - Comment as needed  Activities of Daily Living Home Assistive Devices/Equipment: Cane (specify quad or straight), Walker (specify type), Eyeglasses, Dentures (specify type), Shower chair with back (full dentures) ADL  Screening (condition at time of admission) Patient's cognitive ability adequate to safely complete daily activities?: Yes Is the patient deaf or have difficulty hearing?: No Does the patient have difficulty seeing, even when wearing glasses/contacts?: No Does the patient have difficulty concentrating, remembering, or making decisions?: No Patient able to express need for assistance with ADLs?: Yes Does the patient have difficulty dressing or bathing?: No Independently performs ADLs?: No Communication: Independent Dressing (OT): Independent Grooming: Independent Feeding: Independent Bathing: Needs assistance Is this a change from baseline?: Pre-admission baseline Toileting: Needs assistance Is this a change from baseline?: Pre-admission baseline In/Out Bed: Needs assistance Is this a change from baseline?: Pre-admission baseline Walks in Home: Needs assistance Is this a change from baseline?: Pre-admission baseline Does the patient have difficulty walking or climbing stairs?: Yes Weakness of Legs: Both Weakness of Arms/Hands: Both  Permission Sought/Granted Permission sought to share information with : Case Manager Permission granted to share information with : Yes, Verbal Permission Granted  Share Information with NAME: Case manager           Emotional Assessment Appearance:: Appears stated age Attitude/Demeanor/Rapport: Gracious Affect (typically observed): Accepting Orientation: : Oriented to Self, Oriented to Place Alcohol / Substance Use: Not Applicable Psych Involvement: No (comment)  Admission diagnosis:  Suprapubic catheter ([place]) [Z93.59] Gross hematuria [R31.0] Symptomatic anemia [D64.9] Patient Active Problem List   Diagnosis Date Noted   History of DVT (deep vein thrombosis) 01/12/2022   Gross hematuria 01/12/2022   Symptomatic anemia 01/11/2022   Acute DVT (deep venous thrombosis) ([place]) 12/09/2021  Hematuria 12/09/2021   Urinary retention 12/09/2021    UTI (urinary tract infection) 12/09/2021   HTN (hypertension) 12/09/2021   DM2 (diabetes mellitus, type 2) ([place]) 12/09/2021   Normocytic anemia 12/09/2021   Septic shock ([place]) 02/22/2020   Malignant neoplasm of prostate ([place]) 07/07/2019   Atrial fibrillation ([place]) 06/05/2019   Long term (current) use of anticoagulants 06/05/2019   PCP:  [name], MD Pharmacy:   [place] [address] [place] [address] Phone: [phone] Fax: [phone]  [place] [address] Phone: [phone] Fax: [phone]     Social Determinants of Health (SDOH) Interventions    Readmission Risk Interventions    12/11/2021    2:36 PM  Readmission Risk Prevention Plan  Post Dischage Appt Complete  Medication Screening Complete  Transportation Screening Complete

## 2022-01-20 DIAGNOSIS — D649 Anemia, unspecified: Secondary | ICD-10-CM | POA: Diagnosis not present

## 2022-01-20 LAB — TYPE AND SCREEN
ABO/RH(D): B POS
Antibody Screen: NEGATIVE
Unit division: 0
Unit division: 0

## 2022-01-20 LAB — BASIC METABOLIC PANEL
Anion gap: 6 (ref 5–15)
BUN: 23 mg/dL (ref 8–23)
CO2: 25 mmol/L (ref 22–32)
Calcium: 8.6 mg/dL — ABNORMAL LOW (ref 8.9–10.3)
Chloride: 106 mmol/L (ref 98–111)
Creatinine, Ser: 1.21 mg/dL (ref 0.61–1.24)
GFR, Estimated: >60 mL/min (ref >60)
Glucose, Bld: 102 mg/dL — ABNORMAL HIGH (ref 70–99)
Potassium: 4.4 mmol/L (ref 3.5–5.1)
Sodium: 137 mmol/L (ref 135–145)

## 2022-01-20 LAB — BPAM RBC
Blood Product Expiration Date: 202308122359
Blood Product Expiration Date: 202308122359
ISSUE DATE / TIME: 202307190840
ISSUE DATE / TIME: 202307210618
Unit Type and Rh: 7300
Unit Type and Rh: 7300

## 2022-01-20 LAB — GLUCOSE, CAPILLARY
Glucose-Capillary: 104 mg/dL — ABNORMAL HIGH (ref 70–99)
Glucose-Capillary: 131 mg/dL — ABNORMAL HIGH (ref 70–99)
Glucose-Capillary: 101 mg/dL — ABNORMAL HIGH (ref 70–99)
Glucose-Capillary: 231 mg/dL — ABNORMAL HIGH (ref 70–99)

## 2022-01-20 LAB — HEMOGLOBIN AND HEMATOCRIT, BLOOD
HCT: 24.3 % — ABNORMAL LOW (ref 39.0–52.0)
Hemoglobin: 7.7 g/dL — ABNORMAL LOW (ref 13.0–17.0)

## 2022-01-20 NOTE — Progress Notes (Signed)
PROGRESS NOTE  Nathan [name] RWE:[id number] DOB: 11-Feb-1946 DOA: 01/11/2022 PCP: [name], MD  Brief History   Nathan [name] is an 76 y.o. male past medical history significant for prostate cancer status post radioactive seed implant with suprapubic catheter and chronic hematuria, chronic atrial fibrillation, diabetes mellitus type 2 recent history of DVT status post IVC filter who comes in for gross hematuria patient is a limited historian and most of the history was obtained from the daughter, went to his PCP his hemoglobin was 6 was sent to the ED in the ED, it was 5.  The patient was transfused with 4 units of PRBC's. Urology was consulted. The patient underwent cystoscopy and fulgration of prostate yesterday am. However, it was clear that Prastatic artery embolization would be necessary. The patient underwent this procedure on 01/17/2022. He tolerated the procedure well and is currently on CBI. The fluid in the receiving bag is red.   The patient's hemoglobin dropped to 6.9 this morning. He has received 1 unit PRBC's in transfusion. His hemoglobin has come up to 8.0. Urology has clamped his CBI.   Consultants  Urology Interventional Radiology  Procedures  Cystoscopy with fulguration of bleeding prostate. Prostatic artery embolization  Antibiotics   Anti-infectives (From admission, onward)    Start     Dose/Rate Route Frequency Ordered Stop   01/17/22 1415  cefTRIAXone (ROCEPHIN) 2 g in sodium chloride 0.9 % 100 mL IVPB        2 g 200 mL/hr over 30 Minutes Intravenous  Once 01/17/22 1403 01/17/22 1430   01/17/22 0700  ceFAZolin (ANCEF) IVPB 2g/100 mL premix        2 g 200 mL/hr over 30 Minutes Intravenous  Once 01/17/22 0659 01/17/22 0812   01/12/22 0100  cefTRIAXone (ROCEPHIN) 1 g in sodium chloride 0.9 % 100 mL IVPB  Status:  Discontinued        1 g 200 mL/hr over 30 Minutes Intravenous Every 24 hours 01/12/22 0050 01/13/22 1329      Interval History/Subjective  The  patient is resting comfortably. No new complaints.  Objective   Vitals:  Vitals:   01/20/22 0917 01/20/22 1211  BP: 112/68 111/68  Pulse: 91 83  Resp:  16  Temp:  98.7 F (37.1 C)  SpO2:  100%    Exam:  Constitutional:  The patient is awake, alert, and oriented x 3. No acute distress. Respiratory:  No increased work of breathing. No wheezes, rales, or rhonchi No tactile fremitus Cardiovascular:  Regular rate and rhythm No murmurs, ectopy, or gallups. No lateral PMI. No thrills. Abdomen:  Abdomen is soft, non-tender, non-distended No hernias, masses, or organomegaly Normoactive bowel sounds.  Musculoskeletal:  No cyanosis, clubbing, or edema Skin:  No rashes, lesions, ulcers palpation of skin: no induration or nodules Neurologic:  CN 2-12 intact Sensation all 4 extremities intact Psychiatric:  Mental status Mood, affect appropriate Orientation to person, place, time  judgment and insight appear intact  I have personally reviewed the following:   Today's Data  Vitals  Lab Data  CBC BMP  Micro Data  Urine culture from 01/11/2022 had insignificant growth  Imaging  CTA abdomen and pelvis  Cardiology Data    Other Data    Scheduled Meds:  Chlorhexidine Gluconate Cloth  6 each Topical Daily   finasteride  5 mg Oral Daily   folic acid  1 mg Oral Daily   gabapentin  300 mg Oral TID   insulin aspart  0-15 Units  Subcutaneous TID WC   insulin aspart  0-5 Units Subcutaneous QHS   insulin aspart  4 Units Subcutaneous TID WC   lisinopril  40 mg Oral Daily   magnesium oxide  400 mg Oral Daily   metoprolol succinate  100 mg Oral Daily   vitamin B-12  1,000 mcg Oral Daily   Continuous Infusions:  sodium chloride      Principal Problem:   Symptomatic anemia Active Problems:   Atrial fibrillation (HCC)   Malignant neoplasm of prostate (HCC)   Hematuria   HTN (hypertension)   DM2 (diabetes mellitus, type 2) (HCC)   History of DVT (deep vein  thrombosis)   Gross hematuria   LOS: 8 days   A & P  Symptomatic anemia: Patient presented with a hemoglobin of 4. He has received 4 units of PRBC's in transfusion. Hemoglobin this am has dropped to 6.9. He is receiving 1 unit of PRBC's. Post transfusion hemoglobin is 8.0. Today's hemoglobin is 7.7. Continue to monitor.  Atrial Fibrillation: Rate is controlled and is ranging from 87 - 112. Anticoagulation is held due to acute bleeding and severe anemia.  Acute hematuria: The patient is bleeding from the prostate. He has undergone PAE on 01/17/2022. He has tolerated the procedure well. Treatment as per urology and interventional radiology. Urology has discontinued CBI. The patient continues to have bloody urine. Will continue to monitor hemoglobin.  Malignant Neoplasm of the Prostate: Noted Treatment as per urology.  DM II: Glucoses are being addressed with 4 units Novolog with meals and FSBS followed with SSI. Glucoses for the past 24 hours have been 101-201.  History of DVT: S/P IVC filter.  Hypertension: Blood pressures have been normotensive on lisinopril 40 mg daily and metoprolol succinate 100 mg daily.  I have seen and examined this patient myself. I have spent 32 minutes in his evaluation and care.  DVT prophylaxis: SCD's Code Status: Full Code Family Communication: Spouse is at bedside. Disposition Plan: Home    Taiya Nutting, DO [facility] Direct contact: see [email or web address]  7PM-7AM contact night coverage as above 01/20/2022, 4:48 PM  LOS: 5 days

## 2022-01-20 NOTE — Progress Notes (Signed)
Urology Inpatient Progress Report  Suprapubic catheter ([place]) [Z93.59] Gross hematuria [R31.0] Symptomatic anemia [D64.9]  Procedure(s): CYSTOSCOPY WITH CLOT EVACUATION, FULGERATION OF PROSTATE  3 Days Post-Op   Intv/Subj: No acute events overnight. Patient is without complaint.  Principal Problem:   Symptomatic anemia Active Problems:   Atrial fibrillation (HCC)   Malignant neoplasm of prostate (HCC)   Hematuria   HTN (hypertension)   DM2 (diabetes mellitus, type 2) (HCC)   History of DVT (deep vein thrombosis)   Gross hematuria  Current Facility-Administered Medications  Medication Dose Route Frequency Provider Last Rate Last Admin   0.9 %  sodium chloride infusion  10 mL/hr Intravenous Once Finucane, Elizabeth M, DO       Chlorhexidine Gluconate Cloth 2 % PADS 6 each  6 each Topical Daily Charlynne Cousins, MD   6 each at 01/19/22 1000   finasteride (PROSCAR) tablet 5 mg  5 mg Oral Daily Tu, Ching T, DO   5 mg at 95/62/13 0865   folic acid (FOLVITE) tablet 1 mg  1 mg Oral Daily Tu, Ching T, DO   1 mg at 01/19/22 0851   gabapentin (NEURONTIN) capsule 300 mg  300 mg Oral TID Tu, Ching T, DO   300 mg at 01/19/22 2207   insulin aspart (novoLOG) injection 0-15 Units  0-15 Units Subcutaneous TID WC Charlynne Cousins, MD   2 Units at 01/18/22 1252   insulin aspart (novoLOG) injection 0-5 Units  0-5 Units Subcutaneous QHS Charlynne Cousins, MD   2 Units at 01/19/22 2208   insulin aspart (novoLOG) injection 4 Units  4 Units Subcutaneous TID WC Charlynne Cousins, MD   4 Units at 01/19/22 2006   lisinopril (ZESTRIL) tablet 40 mg  40 mg Oral Daily Charlynne Cousins, MD   40 mg at 01/19/22 0852   magnesium oxide (MAG-OX) tablet 400 mg  400 mg Oral Daily Tu, Ching T, DO   400 mg at 01/19/22 0851   metoprolol succinate (TOPROL-XL) 24 hr tablet 100 mg  100 mg Oral Daily Aileen Fass, [name], MD   100 mg at 01/19/22 0852   ondansetron (ZOFRAN) injection 4 mg  4 mg Intravenous  Q6H PRN Swayze, Ava, DO   4 mg at 01/17/22 1824   oxyCODONE (Oxy IR/ROXICODONE) immediate release tablet 5 mg  5 mg Oral Q6H PRN Tu, Ching T, DO   5 mg at 01/18/22 2008   vitamin B-12 (CYANOCOBALAMIN) tablet 1,000 mcg  1,000 mcg Oral Daily Tu, Ching T, DO   1,000 mcg at 01/19/22 0851     Objective: Vital: Vitals:   01/19/22 0916 01/19/22 1340 01/19/22 2030 01/20/22 0545  BP: 115/77 109/73 112/64 107/72  Pulse: 89 82 81 85  Resp: '16 16 17 18  '$ Temp: 99.2 F (37.3 C) 99.1 F (37.3 C) 99.2 F (37.3 C) 99.8 F (37.7 C)  TempSrc: Oral Oral Oral Oral  SpO2: 98% 98% 96% 98%  Weight:      Height:       I/Os: I/O last 3 completed shifts: In: 78469 [P.O.:1080; I.V.:10; Blood:342; Other:12000] Out: 62952 [Urine:12875]  Physical Exam:  General: Patient is in no apparent distress Lungs: Normal respiratory effort, chest expands symmetrically. GI: The abdomen is soft and nontender without mass. Foley: Suprapubic tube draining light to dark red urine.  Irrigated with no clots returned. Ext: lower extremities symmetric  Lab Results: Recent Labs    01/18/22 0814 01/19/22 0424 01/19/22 1317 01/20/22 0550  WBC 9.6  --   --   --  HGB 7.6* 6.9* 8.0* 7.7*  HCT 24.6* 22.7* 25.3* 24.3*   Recent Labs    01/18/22 0814 01/19/22 0424  NA 139 139  K 4.4 4.7  CL 107 108  CO2 24 26  GLUCOSE 119* 111*  BUN 20 21  CREATININE 1.25* 1.15  CALCIUM 8.3* 8.3*   No results for input(s): "LABPT", "INR" in the last 72 hours. No results for input(s): "LABURIN" in the last 72 hours. Results for orders placed or performed during the hospital encounter of 01/11/22  Urine Culture     Status: Abnormal   Collection Time: 01/11/22  7:05 PM   Specimen: Urine, Suprapubic  Result Value Ref Range Status   Specimen Description   Final    URINE, SUPRAPUBIC Performed at [place] [address], [place], [place] [address]    Special Requests   Final    NONE Performed at [facility], [place] [address], [place], [place] [address]    Culture (A)  Final    <10,000 COLONIES/mL INSIGNIFICANT GROWTH Performed at [place] [address] [place], [place] [address]    Report Status 01/13/2022 FINAL  Final  Surgical pcr screen     Status: None   Collection Time: 01/16/22 10:36 PM   Specimen: Nasal Mucosa; Nasal Swab  Result Value Ref Range Status   MRSA, PCR NEGATIVE NEGATIVE Final   Staphylococcus aureus NEGATIVE NEGATIVE Final    Comment: (NOTE) The Xpert SA Assay (FDA approved for NASAL specimens in patients 47 years of age and older), is one component of a comprehensive surveillance program. It is not intended to diagnose infection nor to guide or monitor treatment. Performed at [facility], [place] [address], [place], [place] [address]     Studies/Results: No results found.  Assessment: Gross hematuria BPH History of prostate cancer status postradiation  Procedure(s): CYSTOSCOPY WITH CLOT EVACUATION, FULGERATION OF PROSTATE, 3 Days Post-Op  doing well.  Plan: Still having some gross hematuria.  Hemoglobin 7.7.  Would recommend keeping another day for observation with a catheter.  I will place an order for nursing to irrigate the Foley every 4 hours until clear to light pink.  We will keep him off CBI for now.   Link Snuffer, MD Urology 01/20/2022, 6:49 AM

## 2022-01-21 DIAGNOSIS — D649 Anemia, unspecified: Secondary | ICD-10-CM | POA: Diagnosis not present

## 2022-01-21 LAB — COMPREHENSIVE METABOLIC PANEL
ALT: 14 U/L (ref 0–44)
AST: 20 U/L (ref 15–41)
Albumin: 2.8 g/dL — ABNORMAL LOW (ref 3.5–5.0)
Alkaline Phosphatase: 44 U/L (ref 38–126)
Anion gap: 9 (ref 5–15)
BUN: 26 mg/dL — ABNORMAL HIGH (ref 8–23)
CO2: 22 mmol/L (ref 22–32)
Calcium: 8.6 mg/dL — ABNORMAL LOW (ref 8.9–10.3)
Chloride: 107 mmol/L (ref 98–111)
Creatinine, Ser: 1.26 mg/dL — ABNORMAL HIGH (ref 0.61–1.24)
GFR, Estimated: 58 mL/min — ABNORMAL LOW (ref >60)
Glucose, Bld: 108 mg/dL — ABNORMAL HIGH (ref 70–99)
Potassium: 4.1 mmol/L (ref 3.5–5.1)
Sodium: 138 mmol/L (ref 135–145)
Total Bilirubin: 0.7 mg/dL (ref 0.3–1.2)
Total Protein: 6.1 g/dL — ABNORMAL LOW (ref 6.5–8.1)

## 2022-01-21 LAB — CBC
HCT: 24.7 % — ABNORMAL LOW (ref 39.0–52.0)
Hemoglobin: 7.6 g/dL — ABNORMAL LOW (ref 13.0–17.0)
MCH: 27.4 pg (ref 26.0–34.0)
MCHC: 30.8 g/dL (ref 30.0–36.0)
MCV: 89.2 fL (ref 80.0–100.0)
Platelets: 210 10*3/uL (ref 150–400)
RBC: 2.77 MIL/uL — ABNORMAL LOW (ref 4.22–5.81)
RDW: 18 % — ABNORMAL HIGH (ref 11.5–15.5)
WBC: 10.9 10*3/uL — ABNORMAL HIGH (ref 4.0–10.5)
nRBC: 0 % (ref 0.0–0.2)

## 2022-01-21 LAB — GLUCOSE, CAPILLARY
Glucose-Capillary: 105 mg/dL — ABNORMAL HIGH (ref 70–99)
Glucose-Capillary: 93 mg/dL (ref 70–99)
Glucose-Capillary: 90 mg/dL (ref 70–99)
Glucose-Capillary: 91 mg/dL (ref 70–99)

## 2022-01-21 MED ORDER — OXYCODONE HCL 5 MG PO TABS
5.0000 mg | ORAL_TABLET | ORAL | Status: DC | PRN
  Administered 2022-01-21 – 2022-01-27 (×2): 10 mg via ORAL
  Filled 2022-01-21 (×3): qty 2

## 2022-01-21 MED ORDER — HYDROMORPHONE HCL 1 MG/ML IJ SOLN
0.5000 mg | Freq: Once | INTRAMUSCULAR | Status: AC
  Administered 2022-01-21: 0.5 mg via INTRAVENOUS
  Filled 2022-01-21: qty 0.5

## 2022-01-21 MED ORDER — SODIUM CHLORIDE 0.9 % IR SOLN
3000.0000 mL | Status: DC
  Administered 2022-01-21 – 2022-01-27 (×15): 3000 mL

## 2022-01-21 NOTE — Progress Notes (Signed)
PROGRESS NOTE  Nathan [name] CWC:[id number] DOB: 1945-07-22 DOA: 01/11/2022 PCP: [name], MD  Brief History   Nathan [name] is an 76 y.o. male past medical history significant for prostate cancer status post radioactive seed implant with suprapubic catheter and chronic hematuria, chronic atrial fibrillation, diabetes mellitus type 2 recent history of DVT status post IVC filter who comes in for gross hematuria patient is a limited historian and most of the history was obtained from the daughter, went to his PCP his hemoglobin was 6 was sent to the ED in the ED, it was 5.  The patient was transfused with 4 units of PRBC's. Urology was consulted. The patient underwent cystoscopy and fulgration of prostate yesterday am. However, it was clear that Prastatic artery embolization would be necessary. The patient underwent this procedure on 01/17/2022. He tolerated the procedure well and is currently on CBI. The fluid in the receiving bag is red.   The patient's hemoglobin dropped to 6.9 oon the morning of 01/20/2022. He has received 1 unit PRBC's in transfusion. His hemoglobin has come up to 8.0. Urology clamped his CBI that day.   On the morning of 01/21/2022 the patient had dark blood in his catheter. Urology has returned the patient to CBI.  Consultants  Urology Interventional Radiology  Procedures  Cystoscopy with fulguration of bleeding prostate. Prostatic artery embolization  Antibiotics   Anti-infectives (From admission, onward)    Start     Dose/Rate Route Frequency Ordered Stop   01/17/22 1415  cefTRIAXone (ROCEPHIN) 2 g in sodium chloride 0.9 % 100 mL IVPB        2 g 200 mL/hr over 30 Minutes Intravenous  Once 01/17/22 1403 01/17/22 1430   01/17/22 0700  ceFAZolin (ANCEF) IVPB 2g/100 mL premix        2 g 200 mL/hr over 30 Minutes Intravenous  Once 01/17/22 0659 01/17/22 0812   01/12/22 0100  cefTRIAXone (ROCEPHIN) 1 g in sodium chloride 0.9 % 100 mL IVPB  Status:  Discontinued         1 g 200 mL/hr over 30 Minutes Intravenous Every 24 hours 01/12/22 0050 01/13/22 1329      Interval History/Subjective  The patient is complaining pain following the re-starting of CBI.  Objective   Vitals:  Vitals:   01/21/22 0610 01/21/22 1343  BP: 114/71 103/71  Pulse: 74 76  Resp: 18 18  Temp: 98.7 F (37.1 C) 99.1 F (37.3 C)  SpO2: 98% 99%    Exam:  Constitutional:  The patient is awake, alert, and oriented x 3. Moderate acute distress from CBI. Respiratory:  No increased work of breathing. No wheezes, rales, or rhonchi No tactile fremitus Cardiovascular:  Regular rate and rhythm No murmurs, ectopy, or gallups. No lateral PMI. No thrills. Abdomen:  Abdomen is soft, non-tender, non-distended No hernias, masses, or organomegaly Normoactive bowel sounds.  Musculoskeletal:  No cyanosis, clubbing, or edema Skin:  No rashes, lesions, ulcers palpation of skin: no induration or nodules Neurologic:  CN 2-12 intact Sensation all 4 extremities intact Psychiatric:  Mental status Mood, affect appropriate Orientation to person, place, time  judgment and insight appear intact  I have personally reviewed the following:   Today's Data  Vitals  Lab Data  CBC BMP  Micro Data  Urine culture from 01/11/2022 had insignificant growth  Imaging  CTA abdomen and pelvis  Cardiology Data    Other Data    Scheduled Meds:  Chlorhexidine Gluconate Cloth  6 each Topical Daily  finasteride  5 mg Oral Daily   folic acid  1 mg Oral Daily   gabapentin  300 mg Oral TID   insulin aspart  0-15 Units Subcutaneous TID WC   insulin aspart  0-5 Units Subcutaneous QHS   insulin aspart  4 Units Subcutaneous TID WC   lisinopril  40 mg Oral Daily   magnesium oxide  400 mg Oral Daily   metoprolol succinate  100 mg Oral Daily   vitamin B-12  1,000 mcg Oral Daily   Continuous Infusions:  sodium chloride     sodium chloride irrigation      Principal Problem:    Symptomatic anemia Active Problems:   Atrial fibrillation (HCC)   Malignant neoplasm of prostate (HCC)   Hematuria   HTN (hypertension)   DM2 (diabetes mellitus, type 2) (HCC)   History of DVT (deep vein thrombosis)   Gross hematuria   LOS: 9 days   A & P  Assessment and Plan: * Symptomatic anemia Secondary to worsening of chronic hematuria -In the setting of prostate cancer s/p seeding/radiation  -chronic urinary retention s/p suprapubic catheter -Hemoglobin on presentation of 4.9.  Will transfuse 2 unit PRBC.  Follow post H&H.  Transfusion threshold of hemoglobin of less than 7 -Continue to monitor hemoglobin.  Gross hematuria The patient continues to have dark blood in urine. Pt returned to CBI by urology this morning. Continue to monitor hemoglobin.  History of DVT (deep vein thrombosis) LLE in the Setting of prostate cancer.  Complicated by chronic hematuria.  Underwent IVC filter placement on 12/10/2021.  DM2 (diabetes mellitus, type 2) (HCC) Diet-controlled  HTN (hypertension) The patient is normotensive on lisionpril and metoprolol. Monitor.  Malignant neoplasm of prostate [facility]) I appreciate urology's assistance. Pt will follow up with oncology as outpatient.  Atrial fibrillation ([place]) Off Eliquis since February.  Deemed not a good candidate due to chronic hematuria.  Had discussion with cardiology regarding Watchman device back in May. Heart rate is within normal limits.   I have seen and examined this patient myself. I have spent 32 minutes in his evaluation and care.  DVT prophylaxis: SCD's Code Status: Full Code Family Communication: Spouse is at bedside. Disposition Plan: Home    Lekesha Claw, DO [facility] Direct contact: see [email or web address]  7PM-7AM contact night coverage as above 01/21/2022, 2:38 PM  LOS: 5 days

## 2022-01-21 NOTE — Progress Notes (Signed)
Urology Inpatient Progress Report  Suprapubic catheter ([place]) [Z93.59] Gross hematuria [R31.0] Symptomatic anemia [D64.9]  Procedure(s): CYSTOSCOPY WITH CLOT EVACUATION, FULGERATION OF PROSTATE  4 Days Post-Op   Intv/Subj: No acute events overnight. Patient is without complaint. Hemoglobin stable at 7.6.  Nursing is manually irrigating every 4 hours.  However, urine is dark red today.  Principal Problem:   Symptomatic anemia Active Problems:   Atrial fibrillation (HCC)   Malignant neoplasm of prostate (HCC)   Hematuria   HTN (hypertension)   DM2 (diabetes mellitus, type 2) (HCC)   History of DVT (deep vein thrombosis)   Gross hematuria  Current Facility-Administered Medications  Medication Dose Route Frequency Provider Last Rate Last Admin   0.9 %  sodium chloride infusion  10 mL/hr Intravenous Once Finucane, Elizabeth M, DO       Chlorhexidine Gluconate Cloth 2 % PADS 6 each  6 each Topical Daily Charlynne Cousins, MD   6 each at 01/20/22 1033   finasteride (PROSCAR) tablet 5 mg  5 mg Oral Daily Tu, Ching T, DO   5 mg at 76/28/31 5176   folic acid (FOLVITE) tablet 1 mg  1 mg Oral Daily Tu, Ching T, DO   1 mg at 01/21/22 1607   gabapentin (NEURONTIN) capsule 300 mg  300 mg Oral TID Tu, Ching T, DO   300 mg at 01/21/22 0827   insulin aspart (novoLOG) injection 0-15 Units  0-15 Units Subcutaneous TID WC Charlynne Cousins, MD   2 Units at 01/20/22 1245   insulin aspart (novoLOG) injection 0-5 Units  0-5 Units Subcutaneous QHS Charlynne Cousins, MD   2 Units at 01/20/22 2123   insulin aspart (novoLOG) injection 4 Units  4 Units Subcutaneous TID WC Charlynne Cousins, MD   4 Units at 01/21/22 0827   lisinopril (ZESTRIL) tablet 40 mg  40 mg Oral Daily Charlynne Cousins, MD   40 mg at 01/21/22 0826   magnesium oxide (MAG-OX) tablet 400 mg  400 mg Oral Daily Tu, Ching T, DO   400 mg at 01/21/22 3710   metoprolol succinate (TOPROL-XL) 24 hr tablet 100 mg  100 mg Oral Daily  Charlynne Cousins, MD   100 mg at 01/21/22 0826   ondansetron (ZOFRAN) injection 4 mg  4 mg Intravenous Q6H PRN Swayze, Ava, DO   4 mg at 01/17/22 1824   oxyCODONE (Oxy IR/ROXICODONE) immediate release tablet 5 mg  5 mg Oral Q6H PRN Tu, Ching T, DO   5 mg at 01/21/22 6269   vitamin B-12 (CYANOCOBALAMIN) tablet 1,000 mcg  1,000 mcg Oral Daily Tu, Ching T, DO   1,000 mcg at 01/21/22 0826     Objective: Vital: Vitals:   01/20/22 0917 01/20/22 1211 01/20/22 2050 01/21/22 0610  BP: 112/68 111/68 93/63 114/71  Pulse: 91 83 87 74  Resp:  '16 18 18  '$ Temp:  98.7 F (37.1 C) 99.1 F (37.3 C) 98.7 F (37.1 C)  TempSrc:   Oral Oral  SpO2:  100% 98% 98%  Weight:      Height:       I/Os: I/O last 3 completed shifts: In: 19 [P.O.:420; Other:280] Out: 1100 [Urine:1100]  Physical Exam:  General: Patient is in no apparent distress Lungs: Normal respiratory effort, chest expands symmetrically. GI: The abdomen is soft and nontender without mass. Foley: Suprapubic tube in place draining dark red urine Ext: lower extremities symmetric  Lab Results: Recent Labs    01/19/22 1317 01/20/22 0550  01/21/22 0844  WBC  --   --  10.9*  HGB 8.0* 7.7* 7.6*  HCT 25.3* 24.3* 24.7*   Recent Labs    01/19/22 0424 01/20/22 0550  NA 139 137  K 4.7 4.4  CL 108 106  CO2 26 25  GLUCOSE 111* 102*  BUN 21 23  CREATININE 1.15 1.21  CALCIUM 8.3* 8.6*   No results for input(s): "LABPT", "INR" in the last 72 hours. No results for input(s): "LABURIN" in the last 72 hours. Results for orders placed or performed during the hospital encounter of 01/11/22  Urine Culture     Status: Abnormal   Collection Time: 01/11/22  7:05 PM   Specimen: Urine, Suprapubic  Result Value Ref Range Status   Specimen Description   Final    URINE, SUPRAPUBIC Performed at [facility] [address] [place], [place] [address]    Special Requests   Final    NONE Performed at [facility], [place] [address], [place], [place] [address]    Culture (A)  Final    <10,000 COLONIES/mL INSIGNIFICANT GROWTH Performed at [facility] [address], [place], [place] [address]    Report Status 01/13/2022 FINAL  Final  Surgical pcr screen     Status: None   Collection Time: 01/16/22 10:36 PM   Specimen: Nasal Mucosa; Nasal Swab  Result Value Ref Range Status   MRSA, PCR NEGATIVE NEGATIVE Final   Staphylococcus aureus NEGATIVE NEGATIVE Final    Comment: (NOTE) The Xpert SA Assay (FDA approved for NASAL specimens in patients 67 years of age and older), is one component of a comprehensive surveillance program. It is not intended to diagnose infection nor to guide or monitor treatment. Performed at [facility], [place] [address] [place], [place] [address]     Studies/Results: No results found.  Assessment: Gross hematuria History of prostate cancer status post radiation BPH  Procedure(s): CYSTOSCOPY WITH CLOT EVACUATION, FULGERATION OF PROSTATE, 4 Days Post-Op  doing well.  Plan: We will plan to recent insert a Foley catheter and initiate continuous bladder irrigation through the suprapubic tube to see if we can clear him up over the next couple of days.  Continue to monitor hemoglobin.   Link Snuffer, MD Urology 01/21/2022, 9:43 AM

## 2022-01-21 NOTE — Assessment & Plan Note (Signed)
I appreciate urology's assistance. Pt will follow up with oncology as outpatient.

## 2022-01-21 NOTE — Assessment & Plan Note (Addendum)
The patient continues to have dark blood in urine. Pt again taken off of CBI by urology this morning. Continue to monitor hemoglobin and urine quality.

## 2022-01-21 NOTE — Progress Notes (Signed)
Patient refused morning Lab draws. 

## 2022-01-22 DIAGNOSIS — D649 Anemia, unspecified: Secondary | ICD-10-CM | POA: Diagnosis not present

## 2022-01-22 LAB — CBC WITH DIFFERENTIAL/PLATELET
Abs Immature Granulocytes: 0.05 10*3/uL (ref 0.00–0.07)
Basophils Absolute: 0 10*3/uL (ref 0.0–0.1)
Basophils Relative: 0 %
Eosinophils Absolute: 0.2 10*3/uL (ref 0.0–0.5)
Eosinophils Relative: 2 %
HCT: 24.5 % — ABNORMAL LOW (ref 39.0–52.0)
Hemoglobin: 7.6 g/dL — ABNORMAL LOW (ref 13.0–17.0)
Immature Granulocytes: 1 %
Lymphocytes Relative: 8 %
Lymphs Abs: 0.8 10*3/uL (ref 0.7–4.0)
MCH: 27.5 pg (ref 26.0–34.0)
MCHC: 31 g/dL (ref 30.0–36.0)
MCV: 88.8 fL (ref 80.0–100.0)
Monocytes Absolute: 1.2 10*3/uL — ABNORMAL HIGH (ref 0.1–1.0)
Monocytes Relative: 12 %
Neutro Abs: 7.9 10*3/uL — ABNORMAL HIGH (ref 1.7–7.7)
Neutrophils Relative %: 77 %
Platelets: 208 10*3/uL (ref 150–400)
RBC: 2.76 MIL/uL — ABNORMAL LOW (ref 4.22–5.81)
RDW: 18.3 % — ABNORMAL HIGH (ref 11.5–15.5)
WBC: 10.3 10*3/uL (ref 4.0–10.5)
nRBC: 0 % (ref 0.0–0.2)

## 2022-01-22 LAB — CBC
HCT: 23.1 % — ABNORMAL LOW (ref 39.0–52.0)
Hemoglobin: 7 g/dL — ABNORMAL LOW (ref 13.0–17.0)
MCH: 27 pg (ref 26.0–34.0)
MCHC: 30.3 g/dL (ref 30.0–36.0)
MCV: 89.2 fL (ref 80.0–100.0)
Platelets: 213 10*3/uL (ref 150–400)
RBC: 2.59 MIL/uL — ABNORMAL LOW (ref 4.22–5.81)
RDW: 18.3 % — ABNORMAL HIGH (ref 11.5–15.5)
WBC: 11.4 10*3/uL — ABNORMAL HIGH (ref 4.0–10.5)
nRBC: 0 % (ref 0.0–0.2)

## 2022-01-22 LAB — BASIC METABOLIC PANEL
Anion gap: 8 (ref 5–15)
BUN: 26 mg/dL — ABNORMAL HIGH (ref 8–23)
CO2: 23 mmol/L (ref 22–32)
Calcium: 8.7 mg/dL — ABNORMAL LOW (ref 8.9–10.3)
Chloride: 105 mmol/L (ref 98–111)
Creatinine, Ser: 1.29 mg/dL — ABNORMAL HIGH (ref 0.61–1.24)
GFR, Estimated: 57 mL/min — ABNORMAL LOW (ref >60)
Glucose, Bld: 101 mg/dL — ABNORMAL HIGH (ref 70–99)
Potassium: 4.9 mmol/L (ref 3.5–5.1)
Sodium: 136 mmol/L (ref 135–145)

## 2022-01-22 LAB — GLUCOSE, CAPILLARY
Glucose-Capillary: 104 mg/dL — ABNORMAL HIGH (ref 70–99)
Glucose-Capillary: 127 mg/dL — ABNORMAL HIGH (ref 70–99)
Glucose-Capillary: 87 mg/dL (ref 70–99)
Glucose-Capillary: 120 mg/dL — ABNORMAL HIGH (ref 70–99)

## 2022-01-22 LAB — MAGNESIUM: Magnesium: 2.1 mg/dL (ref 1.7–2.4)

## 2022-01-22 MED ORDER — ACETAMINOPHEN 650 MG RE SUPP
650.0000 mg | Freq: Four times a day (QID) | RECTAL | Status: AC | PRN
  Administered 2022-01-27: 650 mg via RECTAL
  Filled 2022-01-22: qty 1

## 2022-01-22 MED ORDER — LACTATED RINGERS IV BOLUS
500.0000 mL | Freq: Once | INTRAVENOUS | Status: AC
  Administered 2022-01-22: 500 mL via INTRAVENOUS

## 2022-01-22 MED ORDER — ACETAMINOPHEN 325 MG PO TABS
650.0000 mg | ORAL_TABLET | Freq: Four times a day (QID) | ORAL | Status: AC | PRN
  Administered 2022-01-22 – 2022-01-23 (×2): 650 mg via ORAL
  Filled 2022-01-22 (×2): qty 2

## 2022-01-22 NOTE — Progress Notes (Signed)
PT Cancellation Note  Patient Details Name: Nathan [name] MRN: [id number] DOB: 1946/05/14   Cancelled Treatment:    Reason Eval/Treat Not Completed: Patient declined, no reason specified. Will check back another day.    [facility]  Office: [phone] Pager: [phone]

## 2022-01-22 NOTE — Progress Notes (Addendum)
Urology Inpatient Progress Report  Suprapubic catheter ([place]) [Z93.59] Gross hematuria [R31.0] Symptomatic anemia [D64.9]  Procedure(s): CYSTOSCOPY WITH CLOT EVACUATION, FULGERATION OF PROSTATE  5 Days Post-Op   Intv/Subj: No acute events overnight. Patient is without complaint. Hemoglobin stable at 7.6. CBI running at a very slow rate this AM, completely clear. Clamped.  Principal Problem:   Symptomatic anemia Active Problems:   Atrial fibrillation (HCC)   Malignant neoplasm of prostate (HCC)   HTN (hypertension)   DM2 (diabetes mellitus, type 2) (HCC)   History of DVT (deep vein thrombosis)   Gross hematuria  Current Facility-Administered Medications  Medication Dose Route Frequency Provider Last Rate Last Admin   0.9 %  sodium chloride infusion  10 mL/hr Intravenous Once Finucane, Elizabeth M, DO       Chlorhexidine Gluconate Cloth 2 % PADS 6 each  6 each Topical Daily Charlynne Cousins, MD   6 each at 01/21/22 1137   finasteride (PROSCAR) tablet 5 mg  5 mg Oral Daily Tu, Ching T, DO   5 mg at 91/63/84 6659   folic acid (FOLVITE) tablet 1 mg  1 mg Oral Daily Tu, Ching T, DO   1 mg at 01/21/22 9357   gabapentin (NEURONTIN) capsule 300 mg  300 mg Oral TID Tu, Ching T, DO   300 mg at 01/21/22 2149   insulin aspart (novoLOG) injection 0-15 Units  0-15 Units Subcutaneous TID WC Charlynne Cousins, MD   2 Units at 01/20/22 1245   insulin aspart (novoLOG) injection 0-5 Units  0-5 Units Subcutaneous QHS Charlynne Cousins, MD   2 Units at 01/20/22 2123   insulin aspart (novoLOG) injection 4 Units  4 Units Subcutaneous TID WC Charlynne Cousins, MD   4 Units at 01/21/22 1630   lisinopril (ZESTRIL) tablet 40 mg  40 mg Oral Daily Charlynne Cousins, MD   40 mg at 01/21/22 0826   magnesium oxide (MAG-OX) tablet 400 mg  400 mg Oral Daily Tu, Ching T, DO   400 mg at 01/21/22 0177   metoprolol succinate (TOPROL-XL) 24 hr tablet 100 mg  100 mg Oral Daily Charlynne Cousins, MD    100 mg at 01/21/22 0826   ondansetron (ZOFRAN) injection 4 mg  4 mg Intravenous Q6H PRN Swayze, Ava, DO   4 mg at 01/17/22 1824   oxyCODONE (Oxy IR/ROXICODONE) immediate release tablet 5-10 mg  5-10 mg Oral Q4H PRN Swayze, Ava, DO   10 mg at 01/21/22 1633   sodium chloride irrigation 0.9 % 3,000 mL  3,000 mL Irrigation Continuous Florentina Addison, MD   3,000 mL at 01/22/22 0000   vitamin B-12 (CYANOCOBALAMIN) tablet 1,000 mcg  1,000 mcg Oral Daily Tu, Ching T, DO   1,000 mcg at 01/21/22 0826     Objective: Vital: Vitals:   01/21/22 0610 01/21/22 1343 01/21/22 2020 01/22/22 0329  BP: 114/71 103/71 108/67 120/82  Pulse: 74 76 77 82  Resp: '18 18 16 18  '$ Temp: 98.7 F (37.1 C) 99.1 F (37.3 C) 99.8 F (37.7 C) 98.7 F (37.1 C)  TempSrc: Oral Oral Oral Oral  SpO2: 98% 99% 100% 99%  Weight:      Height:       I/Os: I/O last 3 completed shifts: In: 360 [P.O.:360] Out: 9675 [Urine:9675]  Physical Exam:  General: Patient is in no apparent distress Lungs: Normal respiratory effort, chest expands symmetrically. GI: The abdomen is soft and nontender without mass. Foley: Suprapubic tube in place  draining dark red urine Ext: lower extremities symmetric  Lab Results: Recent Labs    01/20/22 0550 01/21/22 0844 01/22/22 0448  WBC  --  10.9* 10.3  HGB 7.7* 7.6* 7.6*  HCT 24.3* 24.7* 24.5*    Recent Labs    01/20/22 0550 01/21/22 0844 01/22/22 0448  NA 137 138 136  K 4.4 4.1 4.9  CL 106 107 105  CO2 '25 22 23  '$ GLUCOSE 102* 108* 101*  BUN 23 26* 26*  CREATININE 1.21 1.26* 1.29*  CALCIUM 8.6* 8.6* 8.7*    No results for input(s): "LABPT", "INR" in the last 72 hours. No results for input(s): "LABURIN" in the last 72 hours. Results for orders placed or performed during the hospital encounter of 01/11/22  Urine Culture     Status: Abnormal   Collection Time: 01/11/22  7:05 PM   Specimen: Urine, Suprapubic  Result Value Ref Range Status   Specimen Description   Final     URINE, SUPRAPUBIC Performed at [facility] [address], [place], [place] [address]    Special Requests   Final    NONE Performed at [facility], [place] [address] [place], [place] [address]    Culture (A)  Final    <10,000 COLONIES/mL INSIGNIFICANT GROWTH Performed at [facility] [address], [place], [place] [address]    Report Status 01/13/2022 FINAL  Final  Surgical pcr screen     Status: None   Collection Time: 01/16/22 10:36 PM   Specimen: Nasal Mucosa; Nasal Swab  Result Value Ref Range Status   MRSA, PCR NEGATIVE NEGATIVE Final   Staphylococcus aureus NEGATIVE NEGATIVE Final    Comment: (NOTE) The Xpert SA Assay (FDA approved for NASAL specimens in patients 30 years of age and older), is one component of a comprehensive surveillance program. It is not intended to diagnose infection nor to guide or monitor treatment. Performed at [facility], [place] [address] [place],  [address]     Studies/Results: No results found.  Assessment: Gross hematuria History of prostate cancer status post radiation BPH  Procedure(s): CYSTOSCOPY WITH CLOT EVACUATION, FULGERATION OF PROSTATE, 5 Days Post-Op  doing well.  Plan: Continue SP tube and foley catheter to drainage, CBI turned off. If urine character worsens, ok to gently flush. Please do NOT restart CBI with discussing with urology If urine and hemoglobin remain clear, anticipate ok for discharge from urology perspective tomorrow   Update: I came around this afternoon.  Urine was [place] colored again.  Therefore, CBI was restarted.  Continue to trend hemoglobin.

## 2022-01-22 NOTE — Progress Notes (Signed)
PROGRESS NOTE  Nathan [name] JTT:[id number] DOB: Mar 23, 1946 DOA: 01/11/2022 PCP: [name], MD  Brief History   Nathan [name] is an 76 y.o. male past medical history significant for prostate cancer status post radioactive seed implant with suprapubic catheter and chronic hematuria, chronic atrial fibrillation, diabetes mellitus type 2 recent history of DVT status post IVC filter who comes in for gross hematuria patient is a limited historian and most of the history was obtained from the daughter, went to his PCP his hemoglobin was 6 was sent to the ED in the ED, it was 5.  The patient was transfused with 4 units of PRBC's. Urology was consulted. The patient underwent cystoscopy and fulgration of prostate yesterday am. However, it was clear that Prastatic artery embolization would be necessary. The patient underwent this procedure on 01/17/2022. He tolerated the procedure well and is currently on CBI. The fluid in the receiving bag is red.   The patient's hemoglobin dropped to 6.9 oon the morning of 01/20/2022. He has received 1 unit PRBC's in transfusion. His hemoglobin has come up to 8.0. Urology clamped his CBI that day.   On the morning of 01/21/2022 the patient had dark blood in his catheter. Urology returned the patient to CBI and removed it again on 01/22/2022. Continue to monitor quality of urine and hemoglobin.  Discharge to home when urine is clear.  Consultants  Urology Interventional Radiology  Procedures  Cystoscopy with fulguration of bleeding prostate. Prostatic artery embolization  Antibiotics   Anti-infectives (From admission, onward)    Start     Dose/Rate Route Frequency Ordered Stop   01/17/22 1415  cefTRIAXone (ROCEPHIN) 2 g in sodium chloride 0.9 % 100 mL IVPB        2 g 200 mL/hr over 30 Minutes Intravenous  Once 01/17/22 1403 01/17/22 1430   01/17/22 0700  ceFAZolin (ANCEF) IVPB 2g/100 mL premix        2 g 200 mL/hr over 30 Minutes Intravenous  Once  01/17/22 0659 01/17/22 0812   01/12/22 0100  cefTRIAXone (ROCEPHIN) 1 g in sodium chloride 0.9 % 100 mL IVPB  Status:  Discontinued        1 g 200 mL/hr over 30 Minutes Intravenous Every 24 hours 01/12/22 0050 01/13/22 1329      Interval History/Subjective  The patient has no new complaints.   Objective   Vitals:  Vitals:   01/22/22 0329 01/22/22 1307  BP: 120/82 101/63  Pulse: 82 82  Resp: 18 16  Temp: 98.7 F (37.1 C) 98.4 F (36.9 C)  SpO2: 99% 98%    Exam:  Constitutional:  The patient is awake, alert, and oriented x 3. No acute distress. Respiratory:  No increased work of breathing. No wheezes, rales, or rhonchi No tactile fremitus Cardiovascular:  Regular rate and rhythm No murmurs, ectopy, or gallups. No lateral PMI. No thrills. Abdomen:  Abdomen is soft, non-tender, non-distended No hernias, masses, or organomegaly Normoactive bowel sounds.  Musculoskeletal:  No cyanosis, clubbing, or edema Skin:  No rashes, lesions, ulcers palpation of skin: no induration or nodules Neurologic:  CN 2-12 intact Sensation all 4 extremities intact Psychiatric:  Mental status Mood, affect appropriate Orientation to person, place, time  judgment and insight appear intact  I have personally reviewed the following:   Today's Data  Vitals  Lab Data  CBC BMP  Micro Data  Urine culture from 01/11/2022 had insignificant growth  Imaging  CTA abdomen and pelvis  Cardiology Data  Other Data    Scheduled Meds:  Chlorhexidine Gluconate Cloth  6 each Topical Daily   finasteride  5 mg Oral Daily   folic acid  1 mg Oral Daily   gabapentin  300 mg Oral TID   insulin aspart  0-15 Units Subcutaneous TID WC   insulin aspart  0-5 Units Subcutaneous QHS   insulin aspart  4 Units Subcutaneous TID WC   lisinopril  40 mg Oral Daily   magnesium oxide  400 mg Oral Daily   metoprolol succinate  100 mg Oral Daily   vitamin B-12  1,000 mcg Oral Daily   Continuous  Infusions:  sodium chloride     sodium chloride irrigation      Principal Problem:   Symptomatic anemia Active Problems:   Atrial fibrillation (HCC)   Malignant neoplasm of prostate (HCC)   HTN (hypertension)   DM2 (diabetes mellitus, type 2) (HCC)   History of DVT (deep vein thrombosis)   Gross hematuria   LOS: 10 days   A & P  Assessment and Plan: * Symptomatic anemia Secondary to worsening of chronic hematuria -In the setting of prostate cancer s/p seeding/radiation  -chronic urinary retention s/p suprapubic catheter -Hemoglobin on presentation of 4.9. Follow post H&H.  -The patient's hemoglobin is stable at 7.6.   Transfusion threshold of hemoglobin of less than 7. -Continue to monitor hemoglobin.  Gross hematuria The patient continues to have dark blood in urine. Pt again taken off of CBI by urology this morning. Continue to monitor hemoglobin and urine quality.  History of DVT (deep vein thrombosis) LLE in the Setting of prostate cancer.  Complicated by chronic hematuria.  Underwent IVC filter placement on 12/10/2021.  DM2 (diabetes mellitus, type 2) (HCC) Diet-controlled  HTN (hypertension) The patient is normotensive on lisionpril and metoprolol. Monitor.  Malignant neoplasm of prostate [facility]) I appreciate urology's assistance. Pt will follow up with oncology as outpatient.  Atrial fibrillation ([place]) Off Eliquis since February.  Deemed not a good candidate due to chronic hematuria.  Had discussion with cardiology regarding Watchman device back in May. Heart rate is within normal limits.   I have seen and examined this patient myself. I have spent 35 minutes in his evaluation and care.  DVT prophylaxis: SCD's Code Status: Full Code Family Communication: Spouse is at bedside. Disposition Plan: Home    Sherryl Valido, DO [facility] Direct contact: see [email or web address]  7PM-7AM contact night coverage as above 01/22/2022, 3:58 PM  LOS: 5 days

## 2022-01-22 NOTE — Plan of Care (Signed)

## 2022-01-23 DIAGNOSIS — D649 Anemia, unspecified: Secondary | ICD-10-CM | POA: Diagnosis not present

## 2022-01-23 LAB — HEMOGLOBIN AND HEMATOCRIT, BLOOD
HCT: 22 % — ABNORMAL LOW (ref 39.0–52.0)
HCT: 26.5 % — ABNORMAL LOW (ref 39.0–52.0)
Hemoglobin: 6.7 g/dL — CL (ref 13.0–17.0)
Hemoglobin: 8.3 g/dL — ABNORMAL LOW (ref 13.0–17.0)

## 2022-01-23 LAB — GLUCOSE, CAPILLARY
Glucose-Capillary: 109 mg/dL — ABNORMAL HIGH (ref 70–99)
Glucose-Capillary: 136 mg/dL — ABNORMAL HIGH (ref 70–99)
Glucose-Capillary: 123 mg/dL — ABNORMAL HIGH (ref 70–99)
Glucose-Capillary: 143 mg/dL — ABNORMAL HIGH (ref 70–99)

## 2022-01-23 LAB — PREPARE RBC (CROSSMATCH)

## 2022-01-23 MED ORDER — LACTATED RINGERS IV BOLUS
500.0000 mL | Freq: Once | INTRAVENOUS | Status: AC
  Administered 2022-01-23: 500 mL via INTRAVENOUS

## 2022-01-23 MED ORDER — SODIUM CHLORIDE 0.9% IV SOLUTION
Freq: Once | INTRAVENOUS | Status: AC
Start: 2022-01-23 — End: 2022-01-23

## 2022-01-23 NOTE — Care Management Important Message (Signed)
Important Message  Patient Details IM Letter placed in Patients room. Name: Nathan [name] MRN: [id number] Date of Birth: 01/18/1946   Medicare Important Message Given:  Yes     Kerin Salen 01/23/2022, 3:49 PM

## 2022-01-23 NOTE — Progress Notes (Signed)
Pt states that he generally does not feel well today. Provider notified of temp. PRN tylenol ordered , see orders. Will continue to monitor , call bell within reach.   01/22/22 1933  Assess: MEWS Score  Temp (!) 101.4 F (38.6 C)  BP 100/68  MAP (mmHg) 75  Pulse Rate 88  Resp 16  Level of Consciousness Alert  SpO2 99 %  Assess: MEWS Score  MEWS Temp 1  MEWS Systolic 1  MEWS Pulse 0  MEWS RR 0  MEWS LOC 0  MEWS Score 2  MEWS Score Color Yellow  Assess: if the MEWS score is Yellow or Red  Were vital signs taken at a resting state? Yes  Focused Assessment No change from prior assessment  Does the patient meet 2 or more of the SIRS criteria? No  MEWS guidelines implemented *See Row Information* Yes  Treat  MEWS Interventions Administered prn meds/treatments  Pain Scale 0-10  Pain Score 0  Take Vital Signs  Increase Vital Sign Frequency  Yellow: Q 2hr X 2 then Q 4hr X 2, if remains yellow, continue Q 4hrs  Escalate  MEWS: Escalate Yellow: discuss with charge nurse/RN and consider discussing with provider and RRT  Notify: Charge Nurse/RN  Name of Charge Nurse/RN Notified [name] RN  Date Charge Nurse/RN Notified 01/22/22  Time Charge Nurse/RN Notified 2000  Notify: Provider  Provider Name/Title Gershon Cull NP  Date Provider Notified 01/22/22  Time Provider Notified 1937  Method of Notification Page  Notification Reason Other (Comment) (Temp 101.4, needed order for tylenol)  Provider response See new orders  Date of Provider Response 01/22/22  Time of Provider Response 1938  Assess: SIRS CRITERIA  SIRS Temperature  1  SIRS Pulse 0  SIRS Respirations  0  SIRS WBC 1  SIRS Score Sum  2

## 2022-01-23 NOTE — Plan of Care (Signed)
  Problem: Health Behavior/Discharge Planning: Goal: Ability to manage health-related needs will improve Outcome: Progressing   Problem: Clinical Measurements: Goal: Ability to maintain clinical measurements within normal limits will improve Outcome: Progressing Goal: Will remain free from infection Outcome: Progressing Goal: Diagnostic test results will improve Outcome: Progressing Goal: Respiratory complications will improve Outcome: Progressing Goal: Cardiovascular complication will be avoided Outcome: Progressing   Problem: Activity: Goal: Risk for activity intolerance will decrease Outcome: Progressing   Problem: Nutrition: Goal: Adequate nutrition will be maintained Outcome: Progressing   Problem: Coping: Goal: Level of anxiety will decrease Outcome: Progressing   Problem: Elimination: Goal: Will not experience complications related to bowel motility Outcome: Progressing Goal: Will not experience complications related to urinary retention Outcome: Progressing   Problem: Safety: Goal: Ability to remain free from injury will improve Outcome: Progressing   Problem: Skin Integrity: Goal: Risk for impaired skin integrity will decrease Outcome: Progressing   Problem: Education: Goal: Ability to describe self-care measures that may prevent or decrease complications (Diabetes Survival Skills Education) will improve Outcome: Progressing Goal: Individualized Educational Video(s) Outcome: Progressing   Problem: Coping: Goal: Ability to adjust to condition or change in health will improve Outcome: Progressing   Problem: Fluid Volume: Goal: Ability to maintain a balanced intake and output will improve Outcome: Progressing   Problem: Health Behavior/Discharge Planning: Goal: Ability to identify and utilize available resources and services will improve Outcome: Progressing Goal: Ability to manage health-related needs will improve Outcome: Progressing   Problem:  Metabolic: Goal: Ability to maintain appropriate glucose levels will improve Outcome: Progressing   Problem: Nutritional: Goal: Maintenance of adequate nutrition will improve Outcome: Progressing Goal: Progress toward achieving an optimal weight will improve Outcome: Progressing   Problem: Skin Integrity: Goal: Risk for impaired skin integrity will decrease Outcome: Progressing   Problem: Tissue Perfusion: Goal: Adequacy of tissue perfusion will improve Outcome: Progressing   Problem: Education: Goal: Understanding of CV disease, CV risk reduction, and recovery process will improve Outcome: Progressing Goal: Individualized Educational Video(s) Outcome: Progressing   Problem: Activity: Goal: Ability to return to baseline activity level will improve Outcome: Progressing   Problem: Cardiovascular: Goal: Ability to achieve and maintain adequate cardiovascular perfusion will improve Outcome: Progressing Goal: Vascular access site(s) Level 0-1 will be maintained Outcome: Progressing   Problem: Health Behavior/Discharge Planning: Goal: Ability to safely manage health-related needs after discharge will improve Outcome: Progressing

## 2022-01-23 NOTE — Progress Notes (Signed)
PT Cancellation Note  Patient Details Name: Nathan [name] MRN: [id number] DOB: 01/28/1946   Cancelled Treatment:     HgB 6.7        scheduled to receive blood transfusion shortly.  Will attempt another day as schedule permits,  Pt has been evaluated with rec to return home and NO post acute PT indicated.   Rica Koyanagi  PTA Acute  Rehabilitation Services Office M-F          [phone] Weekend pager [phone]

## 2022-01-23 NOTE — Progress Notes (Signed)
Urology Inpatient Progress Report  Suprapubic catheter ([place]) [Z93.59] Gross hematuria [R31.0] Symptomatic anemia [D64.9]  Procedure(s): CYSTOSCOPY WITH CLOT EVACUATION, FULGERATION OF PROSTATE  6 Days Post-Op   Intv/Subj: CBI had to be restarted yesterday after return of merlot colored urine. Hypotensive overnight, repeat H&H demonstrated slightly downtrending hemoglobin and patient received 1u PRBC, normotensive this AM. Urine is again completely clear on a slow drip CBI. Flushed at bedside without return of any clot. CBI turned to a very slow rate but was not discontinued completely. Urine remained very clear after 5 min of observation  Principal Problem:   Symptomatic anemia Active Problems:   Atrial fibrillation (HCC)   Malignant neoplasm of prostate (HCC)   HTN (hypertension)   DM2 (diabetes mellitus, type 2) (HCC)   History of DVT (deep vein thrombosis)   Gross hematuria  Current Facility-Administered Medications  Medication Dose Route Frequency Provider Last Rate Last Admin   0.9 %  sodium chloride infusion  10 mL/hr Intravenous Once Finucane, Elizabeth M, DO       acetaminophen (TYLENOL) tablet 650 mg  650 mg Oral Q6H PRN Kathryne Eriksson, NP   650 mg at 01/22/22 1954   Or   acetaminophen (TYLENOL) suppository 650 mg  650 mg Rectal Q6H PRN Kathryne Eriksson, NP       Chlorhexidine Gluconate Cloth 2 % PADS 6 each  6 each Topical Daily Charlynne Cousins, MD   6 each at 01/22/22 0951   finasteride (PROSCAR) tablet 5 mg  5 mg Oral Daily Tu, Ching T, DO   5 mg at 05/39/76 7341   folic acid (FOLVITE) tablet 1 mg  1 mg Oral Daily Tu, Ching T, DO   1 mg at 01/22/22 0951   gabapentin (NEURONTIN) capsule 300 mg  300 mg Oral TID Tu, Ching T, DO   300 mg at 01/22/22 2215   insulin aspart (novoLOG) injection 0-15 Units  0-15 Units Subcutaneous TID WC Charlynne Cousins, MD   2 Units at 01/22/22 1234   insulin aspart (novoLOG) injection 0-5 Units  0-5 Units Subcutaneous QHS Charlynne Cousins, MD   2 Units at 01/20/22 2123   insulin aspart (novoLOG) injection 4 Units  4 Units Subcutaneous TID WC Charlynne Cousins, MD   4 Units at 01/22/22 1235   lisinopril (ZESTRIL) tablet 40 mg  40 mg Oral Daily Irene Pap N, DO   40 mg at 01/22/22 0951   magnesium oxide (MAG-OX) tablet 400 mg  400 mg Oral Daily Tu, Ching T, DO   400 mg at 01/22/22 0951   metoprolol succinate (TOPROL-XL) 24 hr tablet 100 mg  100 mg Oral Daily Irene Pap N, DO   100 mg at 01/22/22 0951   ondansetron (ZOFRAN) injection 4 mg  4 mg Intravenous Q6H PRN Swayze, Ava, DO   4 mg at 01/17/22 1824   oxyCODONE (Oxy IR/ROXICODONE) immediate release tablet 5-10 mg  5-10 mg Oral Q4H PRN Swayze, Ava, DO   10 mg at 01/21/22 1633   sodium chloride irrigation 0.9 % 3,000 mL  3,000 mL Irrigation Continuous Florentina Addison, MD   3,000 mL at 01/23/22 0727   vitamin B-12 (CYANOCOBALAMIN) tablet 1,000 mcg  1,000 mcg Oral Daily Tu, Ching T, DO   1,000 mcg at 01/22/22 0951     Objective: Vital: Vitals:   01/23/22 0314 01/23/22 0345 01/23/22 0614 01/23/22 0730  BP: 100/73 101/71 107/75 108/67  Pulse: 77 73 75 76  Resp: 18  $'18 20 16  'S$ Temp: 98 F (36.7 C) 98.2 F (36.8 C) 98.7 F (37.1 C) 97.8 F (36.6 C)  TempSrc: Oral Oral Oral Oral  SpO2: 100% 100% 100% 100%  Weight:      Height:       I/Os: I/O last 3 completed shifts: In: 8145.8 [I.V.:13.8; Blood:332; Other:7800] Out: [place] [[id number]]  Physical Exam:  General: Patient is in no apparent distress Lungs: Normal respiratory effort, chest expands symmetrically. GI: The abdomen is soft and nontender without mass. Foley: SP tube in place with CBI running at a slow rate. Urethral catheter with very light pink urine Ext: lower extremities symmetric  Lab Results: Recent Labs    01/21/22 0844 01/22/22 0448 01/22/22 2324 01/23/22 0159  WBC 10.9* 10.3 11.4*  --   HGB 7.6* 7.6* 7.0* 6.7*  HCT 24.7* 24.5* 23.1* 22.0*    Recent Labs     01/21/22 0844 01/22/22 0448  NA 138 136  K 4.1 4.9  CL 107 105  CO2 22 23  GLUCOSE 108* 101*  BUN 26* 26*  CREATININE 1.26* 1.29*  CALCIUM 8.6* 8.7*    No results for input(s): "LABPT", "INR" in the last 72 hours. No results for input(s): "LABURIN" in the last 72 hours. Results for orders placed or performed during the hospital encounter of 01/11/22  Urine Culture     Status: Abnormal   Collection Time: 01/11/22  7:05 PM   Specimen: Urine, Suprapubic  Result Value Ref Range Status   Specimen Description   Final    URINE, SUPRAPUBIC Performed at [facility] [address] [place], [place] [address]    Special Requests   Final    NONE Performed at [facility], [place] [address], [place], [place] [address]    Culture (A)  Final    <10,000 COLONIES/mL INSIGNIFICANT GROWTH Performed at [facility] [address], [place], [place] [address]    Report Status 01/13/2022 FINAL  Final  Surgical pcr screen     Status: None   Collection Time: 01/16/22 10:36 PM   Specimen: Nasal Mucosa; Nasal Swab  Result Value Ref Range Status   MRSA, PCR NEGATIVE NEGATIVE Final   Staphylococcus aureus NEGATIVE NEGATIVE Final    Comment: (NOTE) The Xpert SA Assay (FDA approved for NASAL specimens in patients 49 years of age and older), is one component of a comprehensive surveillance program. It is not intended to diagnose infection nor to guide or monitor treatment. Performed at [facility], [place] [address] [place], [place] [address]     Studies/Results: No results found.  Assessment: Gross hematuria History of prostate cancer status post radiation BPH  Procedure(s): CYSTOSCOPY WITH CLOT EVACUATION, FULGERATION OF PROSTATE, 6 Days Post-Op  doing well.  Plan: Continue SP tube and foley catheter to drainage, CBI at a very slow rate, will re-assess this afternoon If urine character worsens, ok to gently flush but please  do not alter CBI rate without discussing with urology Continue to trend hemoglobin.

## 2022-01-23 NOTE — Progress Notes (Signed)
PROGRESS NOTE  Nathan [name] ZOX:[id number] DOB: [birth year] DOA: 01/11/2022 PCP: [name], MD  HPI/Recap of past 24 hours: Nathan [name] is an 76 y.o. male past medical history significant for prostate cancer status post radioactive seed implant with suprapubic catheter and chronic hematuria, chronic atrial fibrillation not anticoagulated, diabetes mellitus type 2, recent history of DVT status post IVC filter who comes in for gross hematuria.  Went to his PCP his hemoglobin was 6, was sent to the ED.  In the ED, Hg was 5.  Transfused with 4 units of PRBC's.  Seen by Urology, currently on CBI. The fluid in the receiving bag is red.  Hemoglobin dropped to 6.9 on 01/20/2022. He received 1 unit PRBC's transfusion. His hemoglobin has come up to 8.0.  Urology following.   01/23/22: The patient was seen and examined at his bedside.  Still having hematuria.  He wants to know when hematuria will be resolved and when he will be allowed to go home.  Assessment/Plan: Principal Problem:   Symptomatic anemia Active Problems:   Atrial fibrillation (HCC)   Malignant neoplasm of prostate (HCC)   HTN (hypertension)   DM2 (diabetes mellitus, type 2) (HCC)   History of DVT (deep vein thrombosis)   Gross hematuria  * Symptomatic anemia Secondary to worsening of chronic hematuria -In the setting of prostate cancer s/p seeding/radiation  -chronic urinary retention s/p suprapubic catheter -Hemoglobin on presentation of 4.9. Follow post H&H.  -The patient's hemoglobin is stable at 7.6.   Transfusion threshold of hemoglobin of less than 7. 1 UPRBC ordered to be transfused 01/23/22 for Hg 6.7. -Continue to monitor hemoglobin.   Persistent chronic blood loss/Gross hematuria The patient continues to have dark blood in urine. Pt again taken off of CBI by urology this morning. Continue to monitor hemoglobin and urine quality.   History of DVT (deep vein thrombosis) not anticoagulated due to ongoing  bleeding LLE in the Setting of prostate cancer.  Complicated by chronic hematuria.  Underwent IVC filter placement on 12/10/2021.   Prediabetes ([place]) Diet-controlled A1c 5.1 on 12/09/2021 from 5.9 on 05/30/2021.   HTN (hypertension) The patient is normotensive on home lisinopril and metoprolol.  Monitor vital signs.   Malignant neoplasm of prostate [facility]) Appreciate urology's assistance. Pt will follow up with oncology as outpatient.   Paroxysmal atrial fibrillation ([place]) Off Eliquis since February.  Deemed not a good candidate due to chronic hematuria.   Rate controlled on Toprol-XL.   DVT prophylaxis: SCD's Code Status: Full Code Family Communication: Spouse is at bedside. Disposition Plan: Home      Status is: Inpatient The patient requires at least 2 midnights for further evaluation and treatment of present condition.    Objective: Vitals:   01/23/22 0730 01/23/22 0827 01/23/22 0828 01/23/22 1158  BP: 108/67 105/65 105/65 98/63  Pulse: 76  75 92  Resp: 16   20  Temp: 97.8 F (36.6 C)   98.7 F (37.1 C)  TempSrc: Oral   Oral  SpO2: 100%   99%  Weight:      Height:        Intake/Output Summary (Last 24 hours) at 01/23/2022 1327 Last data filed at 01/23/2022 1019 Gross per 24 hour  Intake 9585.83 ml  Output 12375 ml  Net -2789.17 ml   Filed Weights   01/11/22 1907 01/17/22 0719  Weight: 88.5 kg 88.5 kg    Exam:  General: 76 y.o. year-old male well developed well nourished in no acute distress.  Alert and oriented x3. Cardiovascular: Regular rate and rhythm with no rubs or gallops.  No thyromegaly or JVD noted.   Respiratory: Clear to auscultation with no wheezes or rales. Good inspiratory effort. Abdomen: Soft nontender nondistended with normal bowel sounds x4 quadrants. Musculoskeletal: No lower extremity edema. 2/4 pulses in all 4 extremities. Skin: No ulcerative lesions noted or rashes, Psychiatry: Mood is appropriate for condition and setting   Data  Reviewed: CBC: Recent Labs  Lab 01/17/22 0527 01/18/22 0814 01/19/22 0424 01/20/22 0550 01/21/22 0844 01/22/22 0448 01/22/22 2324 01/23/22 0159  WBC 6.7 9.6  --   --  10.9* 10.3 11.4*  --   NEUTROABS  --  7.6  --   --   --  7.9*  --   --   HGB 7.4* 7.6*   < > 7.7* 7.6* 7.6* 7.0* 6.7*  HCT 25.4* 24.6*   < > 24.3* 24.7* 24.5* 23.1* 22.0*  MCV 87.9 86.6  --   --  89.2 88.8 89.2  --   PLT 209 217  --   --  210 208 213  --    < > = values in this interval not displayed.   Basic Metabolic Panel: Recent Labs  Lab 01/18/22 0814 01/19/22 0424 01/20/22 0550 01/21/22 0844 01/22/22 0448  NA 139 139 137 138 136  K 4.4 4.7 4.4 4.1 4.9  CL 107 108 106 107 105  CO2 '24 26 25 22 23  '$ GLUCOSE 119* 111* 102* 108* 101*  BUN '20 21 23 '$ 26* 26*  CREATININE 1.25* 1.15 1.21 1.26* 1.29*  CALCIUM 8.3* 8.3* 8.6* 8.6* 8.7*  MG  --   --   --   --  2.1   GFR: Estimated Creatinine Clearance: 53.3 mL/min (A) (by C-G formula based on SCr of 1.29 mg/dL (H)). Liver Function Tests: Recent Labs  Lab 01/21/22 0844  AST 20  ALT 14  ALKPHOS 44  BILITOT 0.7  PROT 6.1*  ALBUMIN 2.8*   No results for input(s): "LIPASE", "AMYLASE" in the last 168 hours. No results for input(s): "AMMONIA" in the last 168 hours. Coagulation Profile: Recent Labs  Lab 01/17/22 0527  INR 1.2   Cardiac Enzymes: No results for input(s): "CKTOTAL", "CKMB", "CKMBINDEX", "TROPONINI" in the last 168 hours. BNP (last 3 results) No results for input(s): "PROBNP" in the last 8760 hours. HbA1C: No results for input(s): "HGBA1C" in the last 72 hours. CBG: Recent Labs  Lab 01/22/22 1118 01/22/22 1620 01/22/22 2040 01/23/22 0729 01/23/22 1134  GLUCAP 127* 87 120* 109* 136*   Lipid Profile: No results for input(s): "CHOL", "HDL", "LDLCALC", "TRIG", "CHOLHDL", "LDLDIRECT" in the last 72 hours. Thyroid Function Tests: No results for input(s): "TSH", "T4TOTAL", "FREET4", "T3FREE", "THYROIDAB" in the last 72 hours. Anemia  Panel: No results for input(s): "VITAMINB12", "FOLATE", "FERRITIN", "TIBC", "IRON", "RETICCTPCT" in the last 72 hours. Urine analysis:    Component Value Date/Time   COLORURINE RED (A) 01/11/2022 1905   APPEARANCEUR TURBID (A) 01/11/2022 1905   LABSPEC  01/11/2022 1905    TEST NOT REPORTED DUE TO COLOR INTERFERENCE OF URINE PIGMENT   PHURINE  01/11/2022 1905    TEST NOT REPORTED DUE TO COLOR INTERFERENCE OF URINE PIGMENT   GLUCOSEU (A) 01/11/2022 1905    TEST NOT REPORTED DUE TO COLOR INTERFERENCE OF URINE PIGMENT   HGBUR (A) 01/11/2022 1905    TEST NOT REPORTED DUE TO COLOR INTERFERENCE OF URINE PIGMENT   BILIRUBINUR (A) 01/11/2022 1905    TEST NOT  REPORTED DUE TO COLOR INTERFERENCE OF URINE PIGMENT   KETONESUR (A) 01/11/2022 1905    TEST NOT REPORTED DUE TO COLOR INTERFERENCE OF URINE PIGMENT   PROTEINUR (A) 01/11/2022 1905    TEST NOT REPORTED DUE TO COLOR INTERFERENCE OF URINE PIGMENT   NITRITE (A) 01/11/2022 1905    TEST NOT REPORTED DUE TO COLOR INTERFERENCE OF URINE PIGMENT   LEUKOCYTESUR (A) 01/11/2022 1905    TEST NOT REPORTED DUE TO COLOR INTERFERENCE OF URINE PIGMENT   Sepsis Labs: '@LABRCNTIP'$ (procalcitonin:4,lacticidven:4)  ) Recent Results (from the past 240 hour(s))  Surgical pcr screen     Status: None   Collection Time: 01/16/22 10:36 PM   Specimen: Nasal Mucosa; Nasal Swab  Result Value Ref Range Status   MRSA, PCR NEGATIVE NEGATIVE Final   Staphylococcus aureus NEGATIVE NEGATIVE Final    Comment: (NOTE) The Xpert SA Assay (FDA approved for NASAL specimens in patients 64 years of age and older), is one component of a comprehensive surveillance program. It is not intended to diagnose infection nor to guide or monitor treatment. Performed at [facility], [place] [address], [place], [place] [address]       Studies: No results found.  Scheduled Meds:  Chlorhexidine Gluconate Cloth  6 each Topical Daily   finasteride  5 mg Oral Daily    folic acid  1 mg Oral Daily   gabapentin  300 mg Oral TID   insulin aspart  0-15 Units Subcutaneous TID WC   insulin aspart  0-5 Units Subcutaneous QHS   insulin aspart  4 Units Subcutaneous TID WC   lisinopril  40 mg Oral Daily   magnesium oxide  400 mg Oral Daily   metoprolol succinate  100 mg Oral Daily   vitamin B-12  1,000 mcg Oral Daily    Continuous Infusions:  sodium chloride     sodium chloride irrigation       LOS: 11 days     Kayleen Memos, MD [facility] Pager [phone]  If 7PM-7AM, please contact night-coverage [email or web address] Password [email or web address] 01/23/2022, 1:27 PM

## 2022-01-23 NOTE — Progress Notes (Signed)
Referring Physician(s): Festus Aloe  Supervising Physician: Mir, Sharen Heck  Patient Status:  [facility] - In-pt  Chief Complaint:  -6 days s/p PAE by IR  -Cystoscopy and bladder neck fulguration with clot evacuation by Urology    Subjective:  Pt sitting upright in recliner. He states that he is not feeling worse than yesterday. He is eating/drinking, no N/V. Denies fever/chills.  Allergies: Patient has no known allergies.  Medications: Prior to Admission medications   Medication Sig Start Date End Date Taking? Authorizing Provider  Ergocalciferol (VITAMIN D2 PO) Take 50,000 Units by mouth daily.   Yes [provider]  finasteride (PROSCAR) 5 MG tablet Take 5 mg by mouth daily.   Yes [provider]  folic acid (FOLVITE) 1 MG tablet Take 1 mg by mouth daily. 03/24/19  Yes [provider]  gabapentin (NEURONTIN) 300 MG capsule Take 300 mg by mouth 3 (three) times daily. 10/27/21  Yes [provider]  lisinopril (ZESTRIL) 40 MG tablet Take 40 mg by mouth daily. 09/24/20  Yes [provider]  magnesium oxide (MAG-OX) 400 MG tablet Take 400 mg by mouth daily.  03/24/19  Yes [provider]  metoprolol succinate (TOPROL-XL) 100 MG 24 hr tablet Take 100 mg by mouth daily. 11/14/21  Yes [provider]  oxycodone (OXY-IR) 5 MG capsule Take 5 mg by mouth every 6 (six) hours as needed for pain.   Yes [provider]  Prenatal Vit-Fe Fumarate-FA (PRENATAL MULTIVITAMIN) TABS tablet Take 1 tablet by mouth daily at 12 noon.   Yes [provider]  vitamin B-12 (CYANOCOBALAMIN) 1000 MCG tablet Take 1,000 mcg by mouth daily.   Yes [provider]     Vital Signs: BP 98/63 (BP Location: Right Arm)   Pulse 92   Temp 98.7 F (37.1 C) (Oral)   Resp 20   Ht '6\' 1"'$  (1.854 m)   Wt 195 lb (88.5 kg)   SpO2 99%   BMI 25.73 kg/m   Physical Exam Vitals reviewed.  Constitutional:      General: He is not in  acute distress.    Appearance: Normal appearance. He is not ill-appearing.  HENT:     Head: Normocephalic and atraumatic.  Eyes:     Extraocular Movements: Extraocular movements intact.     Pupils: Pupils are equal, round, and reactive to light.  Pulmonary:     Effort: Pulmonary effort is normal. No respiratory distress.  Genitourinary:    Comments: Current CBI with bright red colored urine in foley bag Skin:    General: Skin is warm and dry.  Neurological:     Mental Status: He is alert and oriented to person, place, and time.  Psychiatric:        Mood and Affect: Mood normal.        Behavior: Behavior normal.        Thought Content: Thought content normal.        Judgment: Judgment normal.     Imaging: No results found.  Labs:  CBC: Recent Labs    01/18/22 0814 01/19/22 0424 01/21/22 0844 01/22/22 0448 01/22/22 2324 01/23/22 0159  WBC 9.6  --  10.9* 10.3 11.4*  --   HGB 7.6*   < > 7.6* 7.6* 7.0* 6.7*  HCT 24.6*   < > 24.7* 24.5* 23.1* 22.0*  PLT 217  --  210 208 213  --    < > = values in this interval not displayed.  COAGS: Recent Labs    01/17/22 0527  INR 1.2    BMP: Recent Labs    01/19/22 0424 01/20/22 0550 01/21/22 0844 01/22/22 0448  NA 139 137 138 136  K 4.7 4.4 4.1 4.9  CL 108 106 107 105  CO2 '26 25 22 23  '$ GLUCOSE 111* 102* 108* 101*  BUN 21 23 26* 26*  CALCIUM 8.3* 8.6* 8.6* 8.7*  CREATININE 1.15 1.21 1.26* 1.29*  GFRNONAA >60 >60 58* 57*    LIVER FUNCTION TESTS: Recent Labs    12/09/21 1105 12/10/21 0234 01/21/22 0844  BILITOT 0.7 0.8 0.7  AST '18 16 20  '$ ALT '9 10 14  '$ ALKPHOS 67 53 44  PROT 7.1 5.8* 6.1*  ALBUMIN 3.5 2.7* 2.8*    Assessment and Plan:  History of Prostate cancer --s/p PAE 01/17/22, current CBI with bright red urine in foley.  Hbg 7.0(6.7)  WBC 11.4 (10.3), afebrile   Urology to follow. IR available if needed.   Electronically Signed: Tyson Alias, NP 01/23/2022, 12:01 PM   I spent a total of  15 Minutes at the the patient's bedside AND on the patient's hospital floor or unit, greater than 50% of which was counseling/coordinating care for s/p PAE.

## 2022-01-23 NOTE — Progress Notes (Signed)
Pt got up to bedside commode , did fine ambulating to bathroom with walker. After using bathroom states that he was too weak to stand and get back in bed. Used Stedy with nursing assistant to get pt back in bed. BP taken due to complaints of dizzines. Bp was 96/65. Paged Provider Olena Heckle. See new orders. (500 LR Bolus, CBC) and 1 unit of blood ordered per provider. Will continue to monitor , call bell within reach.   01/23/22 0009  Vitals  Temp 97.9 F (36.6 C)  Temp Source Oral  BP 96/65  MAP (mmHg) 74  BP Location Left Arm  BP Method Automatic  Patient Position (if appropriate) Lying  Pulse Rate 79  Pulse Rate Source Monitor  Resp 16  MEWS COLOR  MEWS Score Color Green  Oxygen Therapy  SpO2 92 %  MEWS Score  MEWS Temp 0  MEWS Systolic 1  MEWS Pulse 0  MEWS RR 0  MEWS LOC 0  MEWS Score 1

## 2022-01-23 NOTE — Progress Notes (Signed)
    OVERNIGHT PROGRESS REPORT  Notified for lower BP. Ordered CBC due to previous notations and treatment path.    Ordered 1 unit PRBCs.   Gershon Cull MSNA MSN ACNPC-AG Acute Care Nurse Practitioner [place]

## 2022-01-23 NOTE — Progress Notes (Signed)
Mobility Specialist - Progress Note     01/23/22 1000  Mobility  Activity Ambulated with assistance in room  Level of Assistance Standby assist, set-up cues, supervision of patient - no hands on  Assistive Device None  Distance Ambulated (ft) 5 ft  Activity Response Tolerated well  Transport method Ambulatory  $Mobility charge 1 Mobility   Pt was agreeable to be mobilized to chair. Pt got to EOB independently. Pt was able to get to chair with assistance of lines. Pt was left in chair with necessities in reach and with chair alarm on. NT was notified of Pt session.  Ferd Hibbs Mobility Specialist

## 2022-01-24 ENCOUNTER — Inpatient Hospital Stay (HOSPITAL_COMMUNITY): Payer: Medicare Other

## 2022-01-24 DIAGNOSIS — D62 Acute posthemorrhagic anemia: Secondary | ICD-10-CM | POA: Diagnosis not present

## 2022-01-24 DIAGNOSIS — N179 Acute kidney failure, unspecified: Secondary | ICD-10-CM | POA: Diagnosis not present

## 2022-01-24 DIAGNOSIS — R509 Fever, unspecified: Secondary | ICD-10-CM | POA: Diagnosis not present

## 2022-01-24 DIAGNOSIS — D649 Anemia, unspecified: Secondary | ICD-10-CM | POA: Diagnosis not present

## 2022-01-24 LAB — CBC WITH DIFFERENTIAL/PLATELET
Abs Immature Granulocytes: 0.08 10*3/uL — ABNORMAL HIGH (ref 0.00–0.07)
Basophils Absolute: 0 10*3/uL (ref 0.0–0.1)
Basophils Relative: 0 %
Eosinophils Absolute: 0.1 10*3/uL (ref 0.0–0.5)
Eosinophils Relative: 1 %
HCT: 25.6 % — ABNORMAL LOW (ref 39.0–52.0)
Hemoglobin: 8.1 g/dL — ABNORMAL LOW (ref 13.0–17.0)
Immature Granulocytes: 1 %
Lymphocytes Relative: 9 %
Lymphs Abs: 0.9 10*3/uL (ref 0.7–4.0)
MCH: 27.7 pg (ref 26.0–34.0)
MCHC: 31.6 g/dL (ref 30.0–36.0)
MCV: 87.7 fL (ref 80.0–100.0)
Monocytes Absolute: 1.4 10*3/uL — ABNORMAL HIGH (ref 0.1–1.0)
Monocytes Relative: 15 %
Neutro Abs: 7.2 10*3/uL (ref 1.7–7.7)
Neutrophils Relative %: 74 %
Platelets: 191 10*3/uL (ref 150–400)
RBC: 2.92 MIL/uL — ABNORMAL LOW (ref 4.22–5.81)
RDW: 18.2 % — ABNORMAL HIGH (ref 11.5–15.5)
WBC: 9.7 10*3/uL (ref 4.0–10.5)
nRBC: 0 % (ref 0.0–0.2)

## 2022-01-24 LAB — PHOSPHORUS: Phosphorus: 5.3 mg/dL — ABNORMAL HIGH (ref 2.5–4.6)

## 2022-01-24 LAB — BASIC METABOLIC PANEL
Anion gap: 10 (ref 5–15)
BUN: 53 mg/dL — ABNORMAL HIGH (ref 8–23)
CO2: 24 mmol/L (ref 22–32)
Calcium: 9.3 mg/dL (ref 8.9–10.3)
Chloride: 105 mmol/L (ref 98–111)
Creatinine, Ser: 2.1 mg/dL — ABNORMAL HIGH (ref 0.61–1.24)
GFR, Estimated: 32 mL/min — ABNORMAL LOW (ref >60)
Glucose, Bld: 111 mg/dL — ABNORMAL HIGH (ref 70–99)
Potassium: 4.4 mmol/L (ref 3.5–5.1)
Sodium: 139 mmol/L (ref 135–145)

## 2022-01-24 LAB — MAGNESIUM: Magnesium: 3 mg/dL — ABNORMAL HIGH (ref 1.7–2.4)

## 2022-01-24 LAB — GLUCOSE, CAPILLARY
Glucose-Capillary: 108 mg/dL — ABNORMAL HIGH (ref 70–99)
Glucose-Capillary: 117 mg/dL — ABNORMAL HIGH (ref 70–99)
Glucose-Capillary: 115 mg/dL — ABNORMAL HIGH (ref 70–99)
Glucose-Capillary: 111 mg/dL — ABNORMAL HIGH (ref 70–99)

## 2022-01-24 MED ORDER — LACTATED RINGERS IV SOLN: INTRAVENOUS | Status: DC

## 2022-01-24 MED ORDER — SODIUM CHLORIDE 0.9 % IV SOLN
1.0000 g | Freq: Every day | INTRAVENOUS | Status: DC
  Administered 2022-01-24 – 2022-01-25 (×2): 1 g via INTRAVENOUS
  Filled 2022-01-24 (×2): qty 10

## 2022-01-24 MED ORDER — GABAPENTIN 100 MG PO CAPS
100.0000 mg | ORAL_CAPSULE | Freq: Three times a day (TID) | ORAL | Status: DC
  Administered 2022-01-24 – 2022-01-28 (×14): 100 mg via ORAL
  Filled 2022-01-24 (×14): qty 1

## 2022-01-24 NOTE — Progress Notes (Signed)
Urology Inpatient Progress Report  Suprapubic catheter ([place]) [Z93.59] Gross hematuria [R31.0] Symptomatic anemia [D64.9]  Procedure(s): CYSTOSCOPY WITH CLOT EVACUATION, FULGERATION OF PROSTATE  7 Days Post-Op   Intv/Subj: CBI continued on very slow drip. Hgb stable this AM. Tmax 102 overnight, creatinine 2.1 from 1.3 prior. Difficult to determine exact I/Os with how CBI is charted, but estimate 750cc urine output. Catheter clear on a very slow drip  Principal Problem:   Symptomatic anemia Active Problems:   Atrial fibrillation (HCC)   Malignant neoplasm of prostate (HCC)   HTN (hypertension)   DM2 (diabetes mellitus, type 2) (HCC)   History of DVT (deep vein thrombosis)   Gross hematuria  Current Facility-Administered Medications  Medication Dose Route Frequency Provider Last Rate Last Admin   0.9 %  sodium chloride infusion  10 mL/hr Intravenous Once Finucane, Elizabeth M, DO       acetaminophen (TYLENOL) tablet 650 mg  650 mg Oral Q6H PRN Kathryne Eriksson, NP   650 mg at 01/23/22 2036   Or   acetaminophen (TYLENOL) suppository 650 mg  650 mg Rectal Q6H PRN Kathryne Eriksson, NP       Chlorhexidine Gluconate Cloth 2 % PADS 6 each  6 each Topical Daily Charlynne Cousins, MD   6 each at 01/23/22 0905   finasteride (PROSCAR) tablet 5 mg  5 mg Oral Daily Tu, Ching T, DO   5 mg at 12/23/74 2831   folic acid (FOLVITE) tablet 1 mg  1 mg Oral Daily Tu, Ching T, DO   1 mg at 01/23/22 5176   gabapentin (NEURONTIN) capsule 300 mg  300 mg Oral TID Tu, Ching T, DO   300 mg at 01/23/22 2221   insulin aspart (novoLOG) injection 0-15 Units  0-15 Units Subcutaneous TID WC Charlynne Cousins, MD   2 Units at 01/23/22 1632   insulin aspart (novoLOG) injection 0-5 Units  0-5 Units Subcutaneous QHS Charlynne Cousins, MD   2 Units at 01/20/22 2123   insulin aspart (novoLOG) injection 4 Units  4 Units Subcutaneous TID WC Charlynne Cousins, MD   4 Units at 01/23/22 1632   lisinopril  (ZESTRIL) tablet 40 mg  40 mg Oral Daily Irene Pap N, DO   40 mg at 01/22/22 0951   magnesium oxide (MAG-OX) tablet 400 mg  400 mg Oral Daily Tu, Ching T, DO   400 mg at 01/23/22 1607   metoprolol succinate (TOPROL-XL) 24 hr tablet 100 mg  100 mg Oral Daily Irene Pap N, DO   100 mg at 01/22/22 0951   ondansetron (ZOFRAN) injection 4 mg  4 mg Intravenous Q6H PRN Swayze, Ava, DO   4 mg at 01/17/22 1824   oxyCODONE (Oxy IR/ROXICODONE) immediate release tablet 5-10 mg  5-10 mg Oral Q4H PRN Swayze, Ava, DO   10 mg at 01/21/22 1633   sodium chloride irrigation 0.9 % 3,000 mL  3,000 mL Irrigation Continuous Florentina Addison, MD   3,000 mL at 01/23/22 1536   vitamin B-12 (CYANOCOBALAMIN) tablet 1,000 mcg  1,000 mcg Oral Daily Tu, Ching T, DO   1,000 mcg at 01/23/22 0904     Objective: Vital: Vitals:   01/23/22 2223 01/23/22 2327 01/24/22 0024 01/24/22 0400  BP: 105/69 104/66 107/70 118/72  Pulse: 94 91 79 73  Resp: 16 (!) '22 18 18  '$ Temp: 98.4 F (36.9 C) 98.3 F (36.8 C) 98.4 F (36.9 C) 98 F (36.7 C)  TempSrc: Oral Oral Oral  Oral  SpO2: 94% 97% 99% 100%  Weight:      Height:       I/Os: I/O last 3 completed shifts: In: 15885.8 [P.O.:240; I.V.:13.8; Blood:332; XNTZG:01749] Out: 18200 [Urine:18200]  Physical Exam:  General: Patient is in no apparent distress Lungs: Normal respiratory effort, chest expands symmetrically. GI: The abdomen is soft and nontender without mass. Foley: SP tube in place with CBI running at a slow rate. Urethral catheter with very light pink urine Ext: lower extremities symmetric  Lab Results: Recent Labs    01/22/22 0448 01/22/22 2324 01/23/22 0159 01/23/22 2110 01/24/22 0442  WBC 10.3 11.4*  --   --  9.7  HGB 7.6* 7.0* 6.7* 8.3* 8.1*  HCT 24.5* 23.1* 22.0* 26.5* 25.6*    Recent Labs    01/21/22 0844 01/22/22 0448 01/24/22 0442  NA 138 136 139  K 4.1 4.9 4.4  CL 107 105 105  CO2 '22 23 24  '$ GLUCOSE 108* 101* 111*  BUN 26* 26* 53*   CREATININE 1.26* 1.29* 2.10*  CALCIUM 8.6* 8.7* 9.3    No results for input(s): "LABPT", "INR" in the last 72 hours. No results for input(s): "LABURIN" in the last 72 hours. Results for orders placed or performed during the hospital encounter of 01/11/22  Urine Culture     Status: Abnormal   Collection Time: 01/11/22  7:05 PM   Specimen: Urine, Suprapubic  Result Value Ref Range Status   Specimen Description   Final    URINE, SUPRAPUBIC Performed at [facility] [address] [place], [place] [address]    Special Requests   Final    NONE Performed at [facility], [place] [address], [place], [place] [address]    Culture (A)  Final    <10,000 COLONIES/mL INSIGNIFICANT GROWTH Performed at [facility] [address] [place], [place] [address]    Report Status 01/13/2022 FINAL  Final  Surgical pcr screen     Status: None   Collection Time: 01/16/22 10:36 PM   Specimen: Nasal Mucosa; Nasal Swab  Result Value Ref Range Status   MRSA, PCR NEGATIVE NEGATIVE Final   Staphylococcus aureus NEGATIVE NEGATIVE Final    Comment: (NOTE) The Xpert SA Assay (FDA approved for NASAL specimens in patients 92 years of age and older), is one component of a comprehensive surveillance program. It is not intended to diagnose infection nor to guide or monitor treatment. Performed at [facility], [place] [address] [place], [place] [address]     Studies/Results: No results found.  Assessment: Gross hematuria History of prostate cancer status post radiation BPH  Procedure(s): CYSTOSCOPY WITH CLOT EVACUATION, FULGERATION OF PROSTATE, 7 Days Post-Op  doing well. Fever overnight with new AKI. Expect AKI pre-renal as catheter is draining well without clot but would be reasonable to rule out obstruction.  Plan: Continue SP tube and foley catheter to drainage, please do not restart CBI without discussing with urology Recommend  UA/urine culture Renal ultrasound for AKI workup, although catheter draining well is reassuring

## 2022-01-24 NOTE — Progress Notes (Signed)
Physical Therapy Treatment Patient Details Name: Nathan [name] MRN: [id number] DOB: 03/29/1946 Today's Date: 01/24/2022   History of Present Illness 76 y.o. male past medical history significant for prostate cancer status post radioactive seed implant with suprapubic catheter and chronic hematuria, chronic atrial fibrillation, diabetes mellitus type 2, recent history of DVT status post IVC filter on 12/10/21 who comes in for gross hematuria and admitted for Acute blood loss anemia/Symptomatic anemia secondary to gross hematuria possibly due to unfortunate sequel of radiation to the prostate. S/P cystoscopy and fulgration of prostate 01/17/22    PT Comments    Pt assisted with multiple lines and agreeable to ambulate if he could return to bed.  Pt ambulated 160 ft in hallway with RW and denied symptoms.  Pt again declined sitting in recliner end of session and returned to bed per request.    Recommendations for follow up therapy are one component of a multi-disciplinary discharge planning process, led by the attending physician.  Recommendations may be updated based on patient status, additional functional criteria and insurance authorization.  Follow Up Recommendations  No PT follow up     Assistance Recommended at Discharge PRN  Patient can return home with the following A little help with walking and/or transfers;Help with stairs or ramp for entrance   Equipment Recommendations  None recommended by PT    Recommendations for Other Services       Precautions / Restrictions Precautions Precautions: Fall Precaution Comments: bladder irrigation, suprapubic catheter     Mobility  Bed Mobility Overal bed mobility: Needs Assistance Bed Mobility: Supine to Sit, Sit to Supine     Supine to sit: Supervision Sit to supine: Supervision   General bed mobility comments: extra time, assist with lines    Transfers Overall transfer level: Needs assistance Equipment used: Rolling walker  (2 wheels) Transfers: Sit to/from Stand Sit to Stand: Min guard           General transfer comment: increased time and effort however able to perform without physical assist    Ambulation/Gait Ambulation/Gait assistance: Min guard Gait Distance (Feet): 160 Feet Assistive device: Rolling walker (2 wheels) Gait Pattern/deviations: Step-through pattern, Decreased stride length, Trunk flexed       General Gait Details: verbal cues for posture, RW positioning, denies any symptoms   Stairs             Wheelchair Mobility    Modified Rankin (Stroke Patients Only)       Balance                                            Cognition Arousal/Alertness: Awake/alert Behavior During Therapy: Flat affect Overall Cognitive Status: Within Functional Limits for tasks assessed                                 General Comments: required encouragement to mobilize        Exercises      General Comments        Pertinent Vitals/Pain Pain Assessment Pain Assessment: No/denies pain    Home Living                          Prior Function            PT Goals (current  goals can now be found in the care plan section) Progress towards PT goals: Progressing toward goals    Frequency    Min 3X/week      PT Plan Current plan remains appropriate    Co-evaluation              AM-PAC PT "6 Clicks" Mobility   Outcome Measure  Help needed turning from your back to your side while in a flat bed without using bedrails?: None Help needed moving from lying on your back to sitting on the side of a flat bed without using bedrails?: None Help needed moving to and from a bed to a chair (including a wheelchair)?: A Little Help needed standing up from a chair using your arms (e.g., wheelchair or bedside chair)?: A Little Help needed to walk in hospital room?: A Little Help needed climbing 3-5 steps with a railing? : A Little 6  Click Score: 20    End of Session   Activity Tolerance: Patient tolerated treatment well Patient left: with call bell/phone within reach;in bed Nurse Communication: Mobility status PT Visit Diagnosis: Difficulty in walking, not elsewhere classified (R26.2)     Time: 1250-1310 PT Time Calculation (min) (ACUTE ONLY): 20 min  Charges:  $Gait Training: 8-22 mins                    Arlyce Dice, DPT Physical Therapist Acute Rehabilitation Services Preferred contact method: Secure Chat Weekend Pager Only: [phone] Office: [place] 01/24/2022, 3:30 PM

## 2022-01-24 NOTE — Progress Notes (Addendum)
PROGRESS NOTE  Nathan [name] YBO:[id number] DOB: December 10, 1945 DOA: 01/11/2022 PCP: [name], MD  Hospital course: Nathan [name] is an 76 y.o. male past medical history significant for prostate cancer status post radioactive seed implant with suprapubic catheter and chronic hematuria, chronic atrial fibrillation not anticoagulated, diabetes mellitus type 2, recent history of DVT status post IVC filter who comes in for gross hematuria.  Went to his PCP where his hemoglobin was 6, was sent to the ED.  In the ED, Hb was 5.  Transfused with 4 units of PRBC's.  Seen by Urology, s/p cystoscopy with clot evacuation, fulguration of prostate, ongoing continuous bladder irrigation.  Course complicated by high fever of 102.1 F overnight 7/25 and 101.4 on 7/24 and AKI.   Assessment/Plan: Principal Problem:   Symptomatic anemia Active Problems:   Atrial fibrillation (HCC)   Malignant neoplasm of prostate (HCC)   HTN (hypertension)   DM2 (diabetes mellitus, type 2) (HCC)   History of DVT (deep vein thrombosis)   Gross hematuria   AKI (acute kidney injury) ([place])   Fever   Acute blood loss anemia ([facility])  * Symptomatic anemia/acute blood loss anemia due to hematuria Secondary to acute on chronic hematuria -In the setting of prostate cancer s/p seeding/radiation  -chronic urinary retention s/p suprapubic catheter -Hemoglobin on presentation of 4.9. -S/p 5 units PRBC transfusion thus far. -Hemoglobin up to 8.3 > 8.1.   -Continue to trend daily CBCs and transfuse for hemoglobin 7 g or less.   Persistent chronic blood loss/Gross hematuria Patient continues to have mild to moderate gross hematuria without clots in the bag. S/p cystoscopy, clot evacuation and fulguration of prostate. S/p continuous bladder irrigation-discontinued 7/25. As per urology follow-up, continue SP tube and Foley catheter to drainage, do not restart CBI without discussing with urology.  Fever Developed high-grade  intermittent fever since 7/24. Unclear etiology without clear source based on symptomatology. UTI certainly possible, given complex GU history and interventions this admission.  However urine culture from 7/13, <10K colonies/insignificant growth. Repeat urine microscopy, culture, blood cultures and chest x-ray. Empirically started on IV ceftriaxone until defervesces and have culture results back. No sepsis features.  Transient tachycardia but not persistent.  No leukocytosis.  Acute kidney injury Creatinine as low as 0.97 on 7/15.  Progressively worsening since 7/23 and up to 2.1. Probably multifactorial due to ongoing lisinopril use, may have received IV contrast for IR angiogram,?  Possible ATN from transient hypotension and may need to rule out UTI. Discontinued lisinopril, gentle IVF, checking renal ultrasound for obstruction, no clinical obstruction based on well draining urinary catheters. Avoid nephrotoxic meds/agents. Follow BMP in AM.  If not improving or if worsening then consider nephrology consultation.   History of DVT (deep vein thrombosis) not anticoagulated due to ongoing bleeding LLE in the Setting of prostate cancer.  Complicated by chronic hematuria.  Underwent IVC filter placement on 12/10/2021.   Prediabetes ([place]) Diet-controlled A1c 5.1 on 12/09/2021 from 5.9 on 05/30/2021. CBGs mostly in the 100s.   HTN (hypertension) The patient is normotensive on home lisinopril and metoprolol.  Monitor vital signs.   Malignant neoplasm of prostate [facility]) Appreciate urology's assistance. Pt will follow up with oncology as outpatient.   Paroxysmal atrial fibrillation ([place]) Off Eliquis since February.  Deemed not a good candidate due to chronic hematuria.   Rate controlled on Toprol-XL.   DVT prophylaxis: SCD's Code Status: Full Code Family Communication: None at bedside Disposition Plan: Home.  However eventual disposition to be  determined pending clinical stability and PT  input.     Status is: Inpatient The patient requires at least 2 midnights for further evaluation and treatment of present condition.  Subjective: Reports feeling sleepy.  Denies pain.  Denies headache, sore throat, earache, cough, dyspnea, nausea, vomiting, diarrhea, abdominal pain or leg pains.  Denies any other complaints.  Objective: Vitals:   01/24/22 0200 01/24/22 0400 01/24/22 0600 01/24/22 0800  BP:  118/72  123/69  Pulse: 74 73 73   Resp: (!) '21 18 15 '$ (!) 21  Temp:  98 F (36.7 C)  98.1 F (36.7 C)  TempSrc:  Oral  Oral  SpO2: 99% 100% 100% 98%  Weight:      Height:        Intake/Output Summary (Last 24 hours) at 01/24/2022 0910 Last data filed at 01/24/2022 0606 Gross per 24 hour  Intake 6000 ml  Output 8550 ml  Net -2550 ml   Filed Weights   01/11/22 1907 01/17/22 0719  Weight: 88.5 kg 88.5 kg    Exam:  General: Elderly male, moderately built and frail lying comfortably propped up in bed without distress.  Oral mucosa with borderline hydration. Cardiovascular: S1 and S2 heard, RRR.  No JVD, murmurs or pedal edema.  Telemetry personally reviewed: Sinus rhythm with BBB morphology. Respiratory: Slightly diminished breath sounds in the bases.  Otherwise clear to auscultation.  No crackles, wheezing or rhonchi. Abdomen: Soft nontender nondistended with normal bowel sounds x4 quadrants.  Foley catheter with mild to moderate gross hematuria in bag without clots. Musculoskeletal: Symmetric movement of all extremities. Skin: Not examined today. Psychiatry: Judgment and insight appear intact.  Mood appropriate.   Data Reviewed: CBC: Recent Labs  Lab 01/18/22 0814 01/19/22 0424 01/21/22 0844 01/22/22 0448 01/22/22 2324 01/23/22 0159 01/23/22 2110 01/24/22 0442  WBC 9.6  --  10.9* 10.3 11.4*  --   --  9.7  NEUTROABS 7.6  --   --  7.9*  --   --   --  7.2  HGB 7.6*   < > 7.6* 7.6* 7.0* 6.7* 8.3* 8.1*  HCT 24.6*   < > 24.7* 24.5* 23.1* 22.0* 26.5* 25.6*  MCV  86.6  --  89.2 88.8 89.2  --   --  87.7  PLT 217  --  210 208 213  --   --  191   < > = values in this interval not displayed.   Basic Metabolic Panel: Recent Labs  Lab 01/19/22 0424 01/20/22 0550 01/21/22 0844 01/22/22 0448 01/24/22 0442  NA 139 137 138 136 139  K 4.7 4.4 4.1 4.9 4.4  CL 108 106 107 105 105  CO2 '26 25 22 23 24  '$ GLUCOSE 111* 102* 108* 101* 111*  BUN 21 23 26* 26* 53*  CREATININE 1.15 1.21 1.26* 1.29* 2.10*  CALCIUM 8.3* 8.6* 8.6* 8.7* 9.3  MG  --   --   --  2.1 3.0*  PHOS  --   --   --   --  5.3*   GFR: Estimated Creatinine Clearance: 32.8 mL/min (A) (by C-G formula based on SCr of 2.1 mg/dL (H)). Liver Function Tests: Recent Labs  Lab 01/21/22 0844  AST 20  ALT 14  ALKPHOS 44  BILITOT 0.7  PROT 6.1*  ALBUMIN 2.8*    CBG: Recent Labs  Lab 01/23/22 0729 01/23/22 1134 01/23/22 1628 01/23/22 2022 01/24/22 0731  GLUCAP 109* 136* 123* 143* 108*    Urine analysis:    Component  Value Date/Time   COLORURINE RED (A) 01/11/2022 1905   APPEARANCEUR TURBID (A) 01/11/2022 1905   LABSPEC  01/11/2022 1905    TEST NOT REPORTED DUE TO COLOR INTERFERENCE OF URINE PIGMENT   PHURINE  01/11/2022 1905    TEST NOT REPORTED DUE TO COLOR INTERFERENCE OF URINE PIGMENT   GLUCOSEU (A) 01/11/2022 1905    TEST NOT REPORTED DUE TO COLOR INTERFERENCE OF URINE PIGMENT   HGBUR (A) 01/11/2022 1905    TEST NOT REPORTED DUE TO COLOR INTERFERENCE OF URINE PIGMENT   BILIRUBINUR (A) 01/11/2022 1905    TEST NOT REPORTED DUE TO COLOR INTERFERENCE OF URINE PIGMENT   KETONESUR (A) 01/11/2022 1905    TEST NOT REPORTED DUE TO COLOR INTERFERENCE OF URINE PIGMENT   PROTEINUR (A) 01/11/2022 1905    TEST NOT REPORTED DUE TO COLOR INTERFERENCE OF URINE PIGMENT   NITRITE (A) 01/11/2022 1905    TEST NOT REPORTED DUE TO COLOR INTERFERENCE OF URINE PIGMENT   LEUKOCYTESUR (A) 01/11/2022 1905    TEST NOT REPORTED DUE TO COLOR INTERFERENCE OF URINE PIGMENT    Recent Results (from the  past 240 hour(s))  Surgical pcr screen     Status: None   Collection Time: 01/16/22 10:36 PM   Specimen: Nasal Mucosa; Nasal Swab  Result Value Ref Range Status   MRSA, PCR NEGATIVE NEGATIVE Final   Staphylococcus aureus NEGATIVE NEGATIVE Final    Comment: (NOTE) The Xpert SA Assay (FDA approved for NASAL specimens in patients 47 years of age and older), is one component of a comprehensive surveillance program. It is not intended to diagnose infection nor to guide or monitor treatment. Performed at [facility], [place] [address] [place],  [address]       Studies: No results found.  Scheduled Meds:  Chlorhexidine Gluconate Cloth  6 each Topical Daily   finasteride  5 mg Oral Daily   folic acid  1 mg Oral Daily   gabapentin  100 mg Oral TID   insulin aspart  0-15 Units Subcutaneous TID WC   insulin aspart  0-5 Units Subcutaneous QHS   insulin aspart  4 Units Subcutaneous TID WC   magnesium oxide  400 mg Oral Daily   metoprolol succinate  100 mg Oral Daily   vitamin B-12  1,000 mcg Oral Daily    Continuous Infusions:  sodium chloride irrigation       LOS: 12 days     Vernell Leep, MD,  FACP, [facility], [facility], [facility] (Care Management Physician Certified) [place]  To contact the attending provider between 7A-7P or the covering provider during after hours 7P-7A, please log into the web site [email or web address] and access using universal [place] password for that web site. If you do not have the password, please call the hospital operator.

## 2022-01-24 NOTE — Progress Notes (Signed)
   01/23/22 2024  Assess: MEWS Score  Temp (!) 102.1 F (38.9 C)  BP 97/61  MAP (mmHg) 71  Pulse Rate (!) 113  ECG Heart Rate (!) 113  Resp 18  Level of Consciousness Alert  SpO2 100 %  O2 Device Room Air  Assess: MEWS Score  MEWS Temp 2  MEWS Systolic 1  MEWS Pulse 2  MEWS RR 0  MEWS LOC 0  MEWS Score 5  MEWS Score Color Red  Assess: if the MEWS score is Yellow or Red  Were vital signs taken at a resting state? Yes  Focused Assessment No change from prior assessment  Does the patient meet 2 or more of the SIRS criteria? No  MEWS guidelines implemented *See Row Information* Yes  Treat  MEWS Interventions Administered prn meds/treatments (tylenol)  Pain Scale 0-10  Pain Score 0  Take Vital Signs  Increase Vital Sign Frequency  Red: Q 1hr X 4 then Q 4hr X 4, if remains red, continue Q 4hrs  Escalate  MEWS: Escalate Red: discuss with charge nurse/RN and provider, consider discussing with RRT  Notify: Charge Nurse/RN  Name of Charge Nurse/RN Notified [name], RN  Date Charge Nurse/RN Notified 01/23/22  Time Charge Nurse/RN Notified 2041  Notify: Provider  Provider Name/Title Gershon Cull, NP  Date Provider Notified 01/23/22  Time Provider Notified 2041  Method of Notification Page  Notification Reason Other (Comment) ([place], Temp 102 HR elevated)  Provider response See new orders  Date of Provider Response 01/23/22  Time of Provider Response 2046  Assess: SIRS CRITERIA  SIRS Temperature  1  SIRS Pulse 1  SIRS Respirations  0  SIRS WBC 1  SIRS Score Sum  3

## 2022-01-25 DIAGNOSIS — N179 Acute kidney failure, unspecified: Secondary | ICD-10-CM | POA: Diagnosis not present

## 2022-01-25 DIAGNOSIS — D62 Acute posthemorrhagic anemia: Secondary | ICD-10-CM | POA: Diagnosis not present

## 2022-01-25 DIAGNOSIS — D649 Anemia, unspecified: Secondary | ICD-10-CM | POA: Diagnosis not present

## 2022-01-25 LAB — BASIC METABOLIC PANEL
Anion gap: 9 (ref 5–15)
BUN: 40 mg/dL — ABNORMAL HIGH (ref 8–23)
CO2: 23 mmol/L (ref 22–32)
Calcium: 8.7 mg/dL — ABNORMAL LOW (ref 8.9–10.3)
Chloride: 105 mmol/L (ref 98–111)
Creatinine, Ser: 1.51 mg/dL — ABNORMAL HIGH (ref 0.61–1.24)
GFR, Estimated: 47 mL/min — ABNORMAL LOW (ref >60)
Glucose, Bld: 115 mg/dL — ABNORMAL HIGH (ref 70–99)
Potassium: 4.6 mmol/L (ref 3.5–5.1)
Sodium: 137 mmol/L (ref 135–145)

## 2022-01-25 LAB — CBC
HCT: 23.6 % — ABNORMAL LOW (ref 39.0–52.0)
Hemoglobin: 7.5 g/dL — ABNORMAL LOW (ref 13.0–17.0)
MCH: 28 pg (ref 26.0–34.0)
MCHC: 31.8 g/dL (ref 30.0–36.0)
MCV: 88.1 fL (ref 80.0–100.0)
Platelets: 211 10*3/uL (ref 150–400)
RBC: 2.68 MIL/uL — ABNORMAL LOW (ref 4.22–5.81)
RDW: 18.3 % — ABNORMAL HIGH (ref 11.5–15.5)
WBC: 9.2 10*3/uL (ref 4.0–10.5)
nRBC: 0 % (ref 0.0–0.2)

## 2022-01-25 LAB — GLUCOSE, CAPILLARY
Glucose-Capillary: 108 mg/dL — ABNORMAL HIGH (ref 70–99)
Glucose-Capillary: 156 mg/dL — ABNORMAL HIGH (ref 70–99)
Glucose-Capillary: 98 mg/dL (ref 70–99)
Glucose-Capillary: 121 mg/dL — ABNORMAL HIGH (ref 70–99)

## 2022-01-25 LAB — URINE CULTURE: Culture: <10000 — AB

## 2022-01-25 MED ORDER — CEPHALEXIN 500 MG PO CAPS
500.0000 mg | ORAL_CAPSULE | Freq: Three times a day (TID) | ORAL | Status: DC
Start: 2022-01-26 — End: 2022-01-28
  Administered 2022-01-26 – 2022-01-28 (×7): 500 mg via ORAL
  Filled 2022-01-25 (×7): qty 1

## 2022-01-25 NOTE — Progress Notes (Signed)
Urology Inpatient Progress Report  Suprapubic catheter ([place]) [Z93.59] Gross hematuria [R31.0] Symptomatic anemia [D64.9]  Procedure(s): CYSTOSCOPY WITH CLOT EVACUATION, FULGERATION OF PROSTATE  8 Days Post-Op   Intv/Subj: CBI continued intermittently overnight, clamped on evaluation this AM with clear yellow urine. Unfortunately urine continues to darken shortly following clamping, will clear quickly with slow drip. Hgb slight downtrend but overall stable from yesterday. RUS yesterday without hydro, creatinine improving this morning.  Principal Problem:   Symptomatic anemia Active Problems:   Atrial fibrillation (HCC)   Malignant neoplasm of prostate (HCC)   HTN (hypertension)   DM2 (diabetes mellitus, type 2) (HCC)   History of DVT (deep vein thrombosis)   Gross hematuria   AKI (acute kidney injury) ([place])   Fever   Acute blood loss anemia (ABLA)  Current Facility-Administered Medications  Medication Dose Route Frequency Provider Last Rate Last Admin   acetaminophen (TYLENOL) tablet 650 mg  650 mg Oral Q6H PRN Kathryne Eriksson, NP   650 mg at 01/23/22 2036   Or   acetaminophen (TYLENOL) suppository 650 mg  650 mg Rectal Q6H PRN Kathryne Eriksson, NP       cefTRIAXone (ROCEPHIN) 1 g in sodium chloride 0.9 % 100 mL IVPB  1 g Intravenous Daily Vernell Leep D, MD 200 mL/hr at 01/24/22 1056 1 g at 01/24/22 1056   Chlorhexidine Gluconate Cloth 2 % PADS 6 each  6 each Topical Daily Charlynne Cousins, MD   6 each at 01/24/22 0928   finasteride (PROSCAR) tablet 5 mg  5 mg Oral Daily Tu, Ching T, DO   5 mg at 79/89/21 1941   folic acid (FOLVITE) tablet 1 mg  1 mg Oral Daily Tu, Ching T, DO   1 mg at 01/24/22 7408   gabapentin (NEURONTIN) capsule 100 mg  100 mg Oral TID Vernell Leep D, MD   100 mg at 01/24/22 2117   insulin aspart (novoLOG) injection 0-15 Units  0-15 Units Subcutaneous TID WC Charlynne Cousins, MD   2 Units at 01/23/22 1632   insulin aspart (novoLOG) injection  0-5 Units  0-5 Units Subcutaneous QHS Charlynne Cousins, MD   2 Units at 01/20/22 2123   insulin aspart (novoLOG) injection 4 Units  4 Units Subcutaneous TID WC Charlynne Cousins, MD   4 Units at 01/24/22 1448   lactated ringers infusion   Intravenous Continuous Modena Jansky, MD 75 mL/hr at 01/24/22 0938 New Bag at 01/24/22 0938   magnesium oxide (MAG-OX) tablet 400 mg  400 mg Oral Daily Tu, Ching T, DO   400 mg at 01/24/22 1856   metoprolol succinate (TOPROL-XL) 24 hr tablet 100 mg  100 mg Oral Daily Irene Pap N, DO   100 mg at 01/24/22 0926   ondansetron (ZOFRAN) injection 4 mg  4 mg Intravenous Q6H PRN Swayze, Ava, DO   4 mg at 01/17/22 1824   oxyCODONE (Oxy IR/ROXICODONE) immediate release tablet 5-10 mg  5-10 mg Oral Q4H PRN Swayze, Ava, DO   10 mg at 01/21/22 1633   sodium chloride irrigation 0.9 % 3,000 mL  3,000 mL Irrigation Continuous Florentina Addison, MD   3,000 mL at 01/24/22 1154   vitamin B-12 (CYANOCOBALAMIN) tablet 1,000 mcg  1,000 mcg Oral Daily Tu, Ching T, DO   1,000 mcg at 01/24/22 0926     Objective: Vital: Vitals:   01/24/22 1400 01/24/22 1600 01/24/22 2145 01/25/22 0624  BP:  114/69    Pulse:  75  Resp: 13 19    Temp:  98.8 F (37.1 C) 99.8 F (37.7 C) 99 F (37.2 C)  TempSrc:  Oral Oral Oral  SpO2:  100%    Weight:      Height:       I/Os: I/O last 3 completed shifts: In: 7110 [P.O.:360; I.V.:750; Other:6000] Out: 10050 [Urine:10050]  Physical Exam:  General: Patient is in no apparent distress Lungs: Normal respiratory effort, chest expands symmetrically. GI: The abdomen is soft and nontender without mass. Foley: SP tube in place with CBI running at a slow rate. Urethral catheter with clear yellow urine Ext: lower extremities symmetric  Lab Results: Recent Labs    01/22/22 2324 01/23/22 0159 01/23/22 2110 01/24/22 0442 01/25/22 0516  WBC 11.4*  --   --  9.7 9.2  HGB 7.0*   < > 8.3* 8.1* 7.5*  HCT 23.1*   < > 26.5* 25.6* 23.6*    < > = values in this interval not displayed.    Recent Labs    01/24/22 0442 01/25/22 0516  NA 139 137  K 4.4 4.6  CL 105 105  CO2 24 23  GLUCOSE 111* 115*  BUN 53* 40*  CREATININE 2.10* 1.51*  CALCIUM 9.3 8.7*    No results for input(s): "LABPT", "INR" in the last 72 hours. No results for input(s): "LABURIN" in the last 72 hours. Results for orders placed or performed during the hospital encounter of 01/11/22  Urine Culture     Status: Abnormal   Collection Time: 01/11/22  7:05 PM   Specimen: Urine, Suprapubic  Result Value Ref Range Status   Specimen Description   Final    URINE, SUPRAPUBIC Performed at [facility] [address], [place], [place] [address]    Special Requests   Final    NONE Performed at [facility], [place] [address], [place], [place] [address]    Culture (A)  Final    <10,000 COLONIES/mL INSIGNIFICANT GROWTH Performed at [facility] [address] [place], [place] [address]    Report Status 01/13/2022 FINAL  Final  Surgical pcr screen     Status: None   Collection Time: 01/16/22 10:36 PM   Specimen: Nasal Mucosa; Nasal Swab  Result Value Ref Range Status   MRSA, PCR NEGATIVE NEGATIVE Final   Staphylococcus aureus NEGATIVE NEGATIVE Final    Comment: (NOTE) The Xpert SA Assay (FDA approved for NASAL specimens in patients 83 years of age and older), is one component of a comprehensive surveillance program. It is not intended to diagnose infection nor to guide or monitor treatment. Performed at [facility], [place] [address] [place], [place] [address]     Studies/Results: US RENAL  Result Date: 01/24/2022 CLINICAL DATA:  Acute kidney injury EXAM: RENAL / URINARY TRACT ULTRASOUND COMPLETE COMPARISON:  CT scan 01/13/2022 FINDINGS: Right Kidney: Renal measurements: 11.0 by 5.5 by 6.0 cm = volume: 189 mL. 3.7 by 3.3 by 3.1 cm cyst of the right kidney upper pole, subtle internal  echoes indicating minimal complexity, corresponding with Bosniak category 2. Renal echogenicity in the parenchyma is similar to that of the adjacent liver. Left Kidney: Renal measurements: 10.1 by 5.9 by 5.9 cm = volume: 182 mL. Left kidney upper pole 2.2 by 2.2 by 2.5 cm minimally complex renal cyst, Bosniak category 2. Mild left hydronephrosis. Bladder: The bladder is collapsed around the Foley catheter. Known prostatomegaly. Other: None. IMPRESSION: 1. Mild left hydronephrosis. 2. Benign-appearing  bilateral renal cysts. 3. Prostatomegaly.  Foley catheter in the urinary bladder. Electronically Signed   By: Van Clines M.D.   On: 01/24/2022 12:52   DG CHEST PORT 1 VIEW  Result Date: 01/24/2022 CLINICAL DATA:  Fever EXAM: PORTABLE CHEST 1 VIEW COMPARISON:  Chest x-ray dated December 09, 2021 FINDINGS: The heart size and mediastinal contours are within normal limits. Both lungs are clear. The visualized skeletal structures are unremarkable. IMPRESSION: No evidence of pneumonia. Electronically Signed   By: Yetta Glassman M.D.   On: 01/24/2022 09:44    Assessment: Gross hematuria History of prostate cancer status post radiation BPH  Procedure(s): CYSTOSCOPY WITH CLOT EVACUATION, FULGERATION OF PROSTATE, 8 Days Post-Op  doing well.  Urine culture results pending. Full effect of prostate artery embolization can take some time, still within window where some hematuria is not surprising. Anticipate this will continue to improve with time.  Plan: Continue SP tube and foley catheter to drainage, please do not restart CBI without discussing with urology Follow-up urine culture and if positive would treat with 10-14 days of culture specific abx for complicated UTI

## 2022-01-25 NOTE — Consult Note (Signed)
   [facility] Inpatient Consult   01/25/2022  Nathan [name] [birth year] [id number]  [facility] [ACO] Patient: Medicare ACO REACH  *Remote coverage for patient at [facility] for length of stay and high risk for unplanned readmission  Primary Care Provider:  [name], MD, is not a  listed [facility] provider, patient showing in [facility] Roster with specialist, Lyman Bishop, MD, Cardiology, will need to verify care coordination if needed   Patient screened for post hospitalization follow up needs.   Plan:  Continue to follow progress and disposition to assess for post hospital care management needs.    For questions contact:   Natividad Brood, RN BSN [facility] Liaison  [phone] business mobile phone Toll free office [phone]  Fax number: [phone] [email or web address]

## 2022-01-25 NOTE — Progress Notes (Signed)
PROGRESS NOTE  Nathan [name] GQQ:[id number] DOB: [birth year] DOA: 01/11/2022 PCP: [name], MD  Hospital course: Nathan [name] is an 76 y.o. male past medical history significant for prostate cancer status post radioactive seed implant with suprapubic catheter and chronic hematuria, chronic atrial fibrillation not anticoagulated, diabetes mellitus type 2, recent history of DVT status post IVC filter who comes in for gross hematuria.  Went to his PCP where his hemoglobin was 6, was sent to the ED.  In the ED, Hb was 5.  Transfused with 4 units of PRBC's.  Seen by Urology, s/p cystoscopy with clot evacuation, fulguration of prostate, ongoing continuous bladder irrigation.  Course complicated by high fever of 102.1 F overnight 7/25 and 101.4 on 7/24 and AKI.  Clinically improved, defervesced.   Assessment/Plan: Principal Problem:   Symptomatic anemia Active Problems:   Atrial fibrillation (HCC)   Malignant neoplasm of prostate (HCC)   HTN (hypertension)   DM2 (diabetes mellitus, type 2) (HCC)   History of DVT (deep vein thrombosis)   Gross hematuria   AKI (acute kidney injury) ([place])   Fever   Acute blood loss anemia (ABLA)  * Symptomatic anemia/acute blood loss anemia due to hematuria Secondary to acute on chronic hematuria -In the setting of prostate cancer s/p seeding/radiation  -chronic urinary retention s/p suprapubic catheter -Hemoglobin on presentation of 4.9. -S/p 5 units PRBC transfusion thus far. -Hemoglobin up to 8.3 > 8.1 >7.5.   -Continue to trend daily CBCs and transfuse for hemoglobin 7 g or less. It is certainly possible that his hemoglobin may further drift down given ongoing hematuria.  No transfusions for today.   Persistent chronic blood loss/Gross hematuria Patient continues to have moderate gross hematuria without clots in the bag. S/p cystoscopy, clot evacuation and fulguration of prostate. S/p continuous bladder irrigation-discontinued 7/25. As per  urology follow-up, continue SP tube and Foley catheter to drainage, do not restart CBI without discussing with urology. As per urology follow-up this afternoon, intermittent hematuria off CBI, this afternoon very mild hematuria and improving from prior. Per urology, if hemoglobin stable and hematuria mild and acceptable, may be able to DC home 7/28.  Fever Developed high-grade intermittent fever since 7/24. Unclear etiology without clear source based on symptomatology. UTI certainly possible, given complex GU history and interventions this admission.  However urine culture from 7/13, <10K colonies/insignificant growth. Repeat urine culture also showed insignificant growth, blood cultures negative to date, chest x-ray without pneumonia. Empirically started on IV ceftriaxone (day 2).  Defervesced.  No leukocytosis.   No sepsis features.  Transient tachycardia but not persistent.  No leukocytosis. As communicated with urology, although prostatic embolization can cause fever, UTIs not ruled out, and as communicated with Dr. Gloriann Loan, discontinued ceftriaxone and transitioned to Keflex for total of 7 days.  Acute kidney injury Creatinine as low as 0.97 on 7/15.  Progressively worsening since 7/23 and up to 2.1. Probably multifactorial due to ongoing lisinopril use, may have received IV contrast for IR angiogram,?  Possible ATN from transient hypotension and may need to rule out UTI. Discontinued lisinopril, gentle IVF, renal ultrasound without obstruction. Avoid nephrotoxic meds/agents. Creatinine has improved to 1.51, continue to trend daily BMP.   History of DVT (deep vein thrombosis) not anticoagulated due to ongoing bleeding LLE in the Setting of prostate cancer.  Complicated by chronic hematuria.  Underwent IVC filter placement on 12/10/2021.   Prediabetes ([place]) Diet-controlled A1c 5.1 on 12/09/2021 from 5.9 on 05/30/2021. CBGs mostly in the 100s.  HTN (hypertension) The patient is  normotensive on home metoprolol.  Lisinopril held.   Malignant neoplasm of prostate [facility]) Appreciate urology's assistance. Pt will follow up with oncology as outpatient.   Paroxysmal atrial fibrillation ([place]) Off Eliquis since February.  Deemed not a good candidate due to chronic hematuria.   Rate controlled on Toprol-XL.   DVT prophylaxis: SCD's Code Status: Full Code Family Communication: Unable to reach daughter via phone, left VM message. Disposition Plan: Home possibly 7/28.  PT evaluation recommends no follow-up.     Status is: Inpatient The patient requires at least 2 midnights for further evaluation and treatment of present condition.  Subjective: Denies complaints.  No pain reported.  As per RN, intermittent hematuria.  Objective: Vitals:   01/24/22 1600 01/24/22 2145 01/25/22 0624 01/25/22 1230  BP: 114/69   119/72  Pulse: 75   80  Resp: 19   16  Temp: 98.8 F (37.1 C) 99.8 F (37.7 C) 99 F (37.2 C) (!) 97.4 F (36.3 C)  TempSrc: Oral Oral Oral Oral  SpO2: 100%   100%  Weight:      Height:        Intake/Output Summary (Last 24 hours) at 01/25/2022 1741 Last data filed at 01/25/2022 1712 Gross per 24 hour  Intake 1350 ml  Output 1600 ml  Net -250 ml   Filed Weights   01/11/22 1907 01/17/22 0719  Weight: 88.5 kg 88.5 kg    Exam:  General: Elderly male, moderately built and frail lying comfortably sitting up in reclining chair.  Oral mucosa moist. Cardiovascular: S1 and S2 heard, RRR.  No JVD, murmurs or pedal edema.  Telemetry personally reviewed: Sinus rhythm. Respiratory: Slightly diminished breath sounds in the bases.  Otherwise clear to auscultation.  No crackles, wheezing or rhonchi. Abdomen: Soft nontender nondistended with normal bowel sounds x4 quadrants.  Foley catheter had moderate hematuria when I had seen the patient. Musculoskeletal: Symmetric movement of all extremities. Skin: Not examined today. Psychiatry: Judgment and insight appear  intact.  Mood appropriate.   Data Reviewed: CBC: Recent Labs  Lab 01/21/22 0844 01/22/22 0448 01/22/22 2324 01/23/22 0159 01/23/22 2110 01/24/22 0442 01/25/22 0516  WBC 10.9* 10.3 11.4*  --   --  9.7 9.2  NEUTROABS  --  7.9*  --   --   --  7.2  --   HGB 7.6* 7.6* 7.0* 6.7* 8.3* 8.1* 7.5*  HCT 24.7* 24.5* 23.1* 22.0* 26.5* 25.6* 23.6*  MCV 89.2 88.8 89.2  --   --  87.7 88.1  PLT 210 208 213  --   --  191 314   Basic Metabolic Panel: Recent Labs  Lab 01/20/22 0550 01/21/22 0844 01/22/22 0448 01/24/22 0442 01/25/22 0516  NA 137 138 136 139 137  K 4.4 4.1 4.9 4.4 4.6  CL 106 107 105 105 105  CO2 '25 22 23 24 23  '$ GLUCOSE 102* 108* 101* 111* 115*  BUN 23 26* 26* 53* 40*  CREATININE 1.21 1.26* 1.29* 2.10* 1.51*  CALCIUM 8.6* 8.6* 8.7* 9.3 8.7*  MG  --   --  2.1 3.0*  --   PHOS  --   --   --  5.3*  --    GFR: Estimated Creatinine Clearance: 45.6 mL/min (A) (by C-G formula based on SCr of 1.51 mg/dL (H)). Liver Function Tests: Recent Labs  Lab 01/21/22 0844  AST 20  ALT 14  ALKPHOS 44  BILITOT 0.7  PROT 6.1*  ALBUMIN 2.8*  CBG: Recent Labs  Lab 01/24/22 1630 01/24/22 2141 01/25/22 0740 01/25/22 1109 01/25/22 1625  GLUCAP 115* 111* 108* 156* 98    Urine analysis:    Component Value Date/Time   COLORURINE RED (A) 01/11/2022 1905   APPEARANCEUR TURBID (A) 01/11/2022 1905   LABSPEC  01/11/2022 1905    TEST NOT REPORTED DUE TO COLOR INTERFERENCE OF URINE PIGMENT   PHURINE  01/11/2022 1905    TEST NOT REPORTED DUE TO COLOR INTERFERENCE OF URINE PIGMENT   GLUCOSEU (A) 01/11/2022 1905    TEST NOT REPORTED DUE TO COLOR INTERFERENCE OF URINE PIGMENT   HGBUR (A) 01/11/2022 1905    TEST NOT REPORTED DUE TO COLOR INTERFERENCE OF URINE PIGMENT   BILIRUBINUR (A) 01/11/2022 1905    TEST NOT REPORTED DUE TO COLOR INTERFERENCE OF URINE PIGMENT   KETONESUR (A) 01/11/2022 1905    TEST NOT REPORTED DUE TO COLOR INTERFERENCE OF URINE PIGMENT   PROTEINUR (A)  01/11/2022 1905    TEST NOT REPORTED DUE TO COLOR INTERFERENCE OF URINE PIGMENT   NITRITE (A) 01/11/2022 1905    TEST NOT REPORTED DUE TO COLOR INTERFERENCE OF URINE PIGMENT   LEUKOCYTESUR (A) 01/11/2022 1905    TEST NOT REPORTED DUE TO COLOR INTERFERENCE OF URINE PIGMENT    Recent Results (from the past 240 hour(s))  Surgical pcr screen     Status: None   Collection Time: 01/16/22 10:36 PM   Specimen: Nasal Mucosa; Nasal Swab  Result Value Ref Range Status   MRSA, PCR NEGATIVE NEGATIVE Final   Staphylococcus aureus NEGATIVE NEGATIVE Final    Comment: (NOTE) The Xpert SA Assay (FDA approved for NASAL specimens in patients 78 years of age and older), is one component of a comprehensive surveillance program. It is not intended to diagnose infection nor to guide or monitor treatment. Performed at [facility], [place] [address], [place], [place] [address]   Urine Culture     Status: Abnormal   Collection Time: 01/24/22  9:15 AM   Specimen: Urine, Catheterized  Result Value Ref Range Status   Specimen Description   Final    URINE, CATHETERIZED Performed at [facility] [address], [place], [place] [address]    Special Requests   Final    NONE Performed at [facility], [place] [address] [place], [place] [address]    Culture (A)  Final    <10,000 COLONIES/mL INSIGNIFICANT GROWTH Performed at [facility] [address], [place], [place] [address]    Report Status 01/25/2022 FINAL  Final  Culture, blood (Routine X 2) w Reflex to ID Panel     Status: None (Preliminary result)   Collection Time: 01/24/22  9:34 AM   Specimen: BLOOD  Result Value Ref Range Status   Specimen Description   Final    BLOOD LEFT ANTECUBITAL Performed at [facility] [address] [place], [place] [address]    Special Requests   Final    BOTTLES DRAWN AEROBIC ONLY Blood Culture adequate volume Performed at [facility] [address], [place], [place] [address]    Culture   Final    NO GROWTH < 24 HOURS Performed at [facility] [address], [place],  [address]    Report Status PENDING  Incomplete  Culture, blood (Routine X 2) w Reflex to ID Panel     Status: None (Preliminary result)   Collection Time: 01/24/22  9:34 AM   Specimen: BLOOD LEFT HAND  Result Value Ref Range Status   Specimen Description   Final    BLOOD LEFT HAND Performed at [facility] [address], [place], [place] [address]    Special Requests   Final    BOTTLES DRAWN AEROBIC ONLY Blood Culture adequate volume Performed at [facility] [address], [place], [place] [address]    Culture   Final    NO GROWTH < 24 HOURS Performed at [facility] [address], [place], [place] [address]    Report Status PENDING  Incomplete      Studies: No results found.  Scheduled Meds:  [START ON 01/26/2022] cephALEXin  500 mg Oral Q8H   Chlorhexidine Gluconate Cloth  6 each Topical Daily   finasteride  5 mg Oral Daily   folic acid  1 mg Oral Daily   gabapentin  100 mg Oral TID   insulin aspart  0-15 Units Subcutaneous TID WC   insulin aspart  0-5 Units Subcutaneous QHS   insulin aspart  4 Units Subcutaneous TID WC   magnesium oxide  400 mg Oral Daily   metoprolol succinate  100 mg Oral Daily   cyanocobalamin  1,000 mcg Oral Daily    Continuous Infusions:  sodium chloride irrigation       LOS: 13 days     Vernell Leep, MD,  FACP, [facility], [facility], [facility] (Care Management Physician Certified) [place]  To contact the attending provider between 7A-7P or the covering provider during after hours 7P-7A, please log into the web site [email or web address] and access using universal [place] password for that web site. If you do not have the password, please call the hospital operator.

## 2022-01-26 DIAGNOSIS — N179 Acute kidney failure, unspecified: Secondary | ICD-10-CM | POA: Diagnosis not present

## 2022-01-26 DIAGNOSIS — D62 Acute posthemorrhagic anemia: Secondary | ICD-10-CM | POA: Diagnosis not present

## 2022-01-26 DIAGNOSIS — D649 Anemia, unspecified: Secondary | ICD-10-CM | POA: Diagnosis not present

## 2022-01-26 LAB — BASIC METABOLIC PANEL
Anion gap: 7 (ref 5–15)
BUN: 33 mg/dL — ABNORMAL HIGH (ref 8–23)
CO2: 23 mmol/L (ref 22–32)
Calcium: 8.3 mg/dL — ABNORMAL LOW (ref 8.9–10.3)
Chloride: 106 mmol/L (ref 98–111)
Creatinine, Ser: 1.13 mg/dL (ref 0.61–1.24)
GFR, Estimated: >60 mL/min (ref >60)
Glucose, Bld: 99 mg/dL (ref 70–99)
Potassium: 4.7 mmol/L (ref 3.5–5.1)
Sodium: 136 mmol/L (ref 135–145)

## 2022-01-26 LAB — GLUCOSE, CAPILLARY
Glucose-Capillary: 110 mg/dL — ABNORMAL HIGH (ref 70–99)
Glucose-Capillary: 149 mg/dL — ABNORMAL HIGH (ref 70–99)
Glucose-Capillary: 353 mg/dL — ABNORMAL HIGH (ref 70–99)
Glucose-Capillary: 97 mg/dL (ref 70–99)
Glucose-Capillary: 128 mg/dL — ABNORMAL HIGH (ref 70–99)

## 2022-01-26 LAB — PREPARE RBC (CROSSMATCH)

## 2022-01-26 LAB — CBC
HCT: 21.3 % — ABNORMAL LOW (ref 39.0–52.0)
HCT: 24.2 % — ABNORMAL LOW (ref 39.0–52.0)
Hemoglobin: 6.6 g/dL — CL (ref 13.0–17.0)
Hemoglobin: 7.6 g/dL — ABNORMAL LOW (ref 13.0–17.0)
MCH: 27.2 pg (ref 26.0–34.0)
MCH: 27.2 pg (ref 26.0–34.0)
MCHC: 31 g/dL (ref 30.0–36.0)
MCHC: 31.4 g/dL (ref 30.0–36.0)
MCV: 87.7 fL (ref 80.0–100.0)
MCV: 86.7 fL (ref 80.0–100.0)
Platelets: 228 10*3/uL (ref 150–400)
Platelets: 241 10*3/uL (ref 150–400)
RBC: 2.43 MIL/uL — ABNORMAL LOW (ref 4.22–5.81)
RBC: 2.79 MIL/uL — ABNORMAL LOW (ref 4.22–5.81)
RDW: 18.1 % — ABNORMAL HIGH (ref 11.5–15.5)
RDW: 17.4 % — ABNORMAL HIGH (ref 11.5–15.5)
WBC: 8.6 10*3/uL (ref 4.0–10.5)
WBC: 9.1 10*3/uL (ref 4.0–10.5)
nRBC: 0 % (ref 0.0–0.2)
nRBC: 0 % (ref 0.0–0.2)

## 2022-01-26 MED ORDER — ACETAMINOPHEN 325 MG PO TABS
650.0000 mg | ORAL_TABLET | Freq: Once | ORAL | Status: AC
  Administered 2022-01-26: 650 mg via ORAL
  Filled 2022-01-26: qty 2

## 2022-01-26 MED ORDER — DIPHENHYDRAMINE HCL 25 MG PO CAPS
25.0000 mg | ORAL_CAPSULE | Freq: Once | ORAL | Status: AC
  Administered 2022-01-26: 25 mg via ORAL
  Filled 2022-01-26: qty 1

## 2022-01-26 NOTE — Progress Notes (Signed)
   01/26/22 8441  Provider Notification  Provider Name/Title Jani Gravel  Date Provider Notified 01/26/22  Time Provider Notified [phone]  Method of Notification Page  Notification Reason Critical result  Test performed and critical result Hgb 6.6  Date Critical Result Received 01/26/22  Time Critical Result Received 0605  Provider response See new orders  Date of Provider Response 01/26/22  Time of Provider Response [phone]    New orders in for a type and screen, and 1 Unit PRBC.

## 2022-01-26 NOTE — Progress Notes (Signed)
Patient ID: Nathan [name], male   DOB: May 24, 1946, 76 y.o.   MRN: [id number]  Hgb appropriately increased after 1 u PRBCs today. Pt with worse hematuria this evening.  CBI restarted but catheter was blocked and required hand irrigation with multiple clots removed.  CBI now restarted.  If not significantly improved in the morning, Dr. Louis Meckel plan to take him to the OR for cystoscopy, clot evacuation, and possible fulguration of prostate/bladder.  His daughter, Nathan [name], was updated and agrees with this plan.  Will keep him NPO after MN.

## 2022-01-26 NOTE — Progress Notes (Signed)
PT Cancellation Note  Patient Details Name: Nathan [name] MRN: [id number] DOB: 1945-09-13   Cancelled Treatment:    Reason Eval/Treat Not Completed: Medical issues which prohibited therapy (low Hgb, blood transfusion)   Harlene Ramus 01/26/2022, 8:55 AM Arlyce Dice, DPT Physical Therapist Acute Rehabilitation Services Preferred contact method: Secure Chat Weekend Pager Only: [phone] Office: [phone]

## 2022-01-26 NOTE — Progress Notes (Signed)
Xcover Message in [place] by RN, Hgb 6.6, pt with hx of Afib, and admitted for gross hematuria.    Orders placed for Transfusion 1 unit prbc, and repeat h/h after transfusion and then response in Epic sent to RN

## 2022-01-26 NOTE — Progress Notes (Signed)
Patient ID: Nathan [name], male   DOB: Nov 12, 1945, 76 y.o.   MRN: [id number]  9 Days Post-Op Subjective: Events and prior notes reviewed.  No complaints.  Objective: Vital signs in last 24 hours: Temp:  [97.4 F (36.3 C)] 97.4 F (36.3 C) (07/27 1230) Pulse Rate:  [80] 80 (07/27 1230) Resp:  [16] 16 (07/27 1230) BP: (119)/(72) 119/72 (07/27 1230) SpO2:  [100 %] 100 % (07/27 1230)  Intake/Output from previous day: 07/27 0701 - 07/28 0700 In: 600 [P.O.:600] Out: 1400 [Urine:1400] Intake/Output this shift: No intake/output data recorded.  Physical Exam:  General: Alert and oriented GU: Urine in urethral catheter red tinged but draining well.  When CBI turned on, urine clears immediately and completely.  Lab Results: Recent Labs    01/24/22 0442 01/25/22 0516 01/26/22 0536  HGB 8.1* 7.5* 6.6*  HCT 25.6* 23.6* 21.3*   BMET Recent Labs    01/25/22 0516 01/26/22 0536  NA 137 136  K 4.6 4.7  CL 105 106  CO2 23 23  GLUCOSE 115* 99  BUN 40* 33*  CREATININE 1.51* 1.13  CALCIUM 8.7* 8.3*     Studies/Results: US RENAL  Result Date: 01/24/2022 CLINICAL DATA:  Acute kidney injury EXAM: RENAL / URINARY TRACT ULTRASOUND COMPLETE COMPARISON:  CT scan 01/13/2022 FINDINGS: Right Kidney: Renal measurements: 11.0 by 5.5 by 6.0 cm = volume: 189 mL. 3.7 by 3.3 by 3.1 cm cyst of the right kidney upper pole, subtle internal echoes indicating minimal complexity, corresponding with Bosniak category 2. Renal echogenicity in the parenchyma is similar to that of the adjacent liver. Left Kidney: Renal measurements: 10.1 by 5.9 by 5.9 cm = volume: 182 mL. Left kidney upper pole 2.2 by 2.2 by 2.5 cm minimally complex renal cyst, Bosniak category 2. Mild left hydronephrosis. Bladder: The bladder is collapsed around the Foley catheter. Known prostatomegaly. Other: None. IMPRESSION: 1. Mild left hydronephrosis. 2. Benign-appearing bilateral renal cysts. 3. Prostatomegaly.  Foley catheter in the  urinary bladder. Electronically Signed   By: Van Clines M.D.   On: 01/24/2022 12:52   DG CHEST PORT 1 VIEW  Result Date: 01/24/2022 CLINICAL DATA:  Fever EXAM: PORTABLE CHEST 1 VIEW COMPARISON:  Chest x-ray dated December 09, 2021 FINDINGS: The heart size and mediastinal contours are within normal limits. Both lungs are clear. The visualized skeletal structures are unremarkable. IMPRESSION: No evidence of pneumonia. Electronically Signed   By: Yetta Glassman M.D.   On: 01/24/2022 09:44    Assessment/Plan: Hematuria: S/P PAE 7/19 (unilateral left side).  Hematuria currently appears to be old blood and minimal as it clears immediately once CBI is turned on.  This raises the question if ongoing anemia is all related to acute blood loss from GU source.  Agree with transfusion this morning and monitoring of H/H.  Would still not recommend any intervention.  Will keep CBI off but hooked up and leave both SP tube and urethral catheter in place.  May consider capping urethral catheter and placing SP tube to drainage tomorrow if Hgb stabilizes over next 24 hrs.   LOS: 14 days   Dutch Gray 01/26/2022, 7:15 AM

## 2022-01-26 NOTE — Progress Notes (Signed)
PROGRESS NOTE  Nathan [name] YIR:[id number] DOB: 11-Sep-1945 DOA: 01/11/2022 PCP: [name], MD  Hospital course: Nathan [name] is an 76 y.o. male past medical history significant for prostate cancer status post radioactive seed implant with suprapubic catheter and chronic hematuria, chronic atrial fibrillation not anticoagulated, diabetes mellitus type 2, recent history of DVT status post IVC filter who comes in for gross hematuria.  Went to his PCP where his hemoglobin was 6, was sent to the ED.  In the ED, Hb was 5.  Transfused with 4 units of PRBC's.  Seen by Urology, s/p cystoscopy with clot evacuation, fulguration of prostate, ongoing continuous bladder irrigation.  Course complicated by high fever of 102.1 F overnight 7/25 and 101.4 on 7/24 and AKI.  Clinically improved, defervesced.  ABLA, Hb down to 6.6 on 7/28, transfusing PRBC.   Assessment/Plan: Principal Problem:   Symptomatic anemia Active Problems:   Atrial fibrillation (HCC)   Malignant neoplasm of prostate (HCC)   HTN (hypertension)   DM2 (diabetes mellitus, type 2) (HCC)   History of DVT (deep vein thrombosis)   Gross hematuria   AKI (acute kidney injury) ([place])   Fever   Acute blood loss anemia (ABLA)  * Symptomatic anemia/acute blood loss anemia due to hematuria Secondary to acute on chronic hematuria -In the setting of prostate cancer s/p seeding/radiation  -chronic urinary retention s/p suprapubic catheter -Hemoglobin on presentation of 4.9. -S/p 5 units PRBC transfusion thus far. -Hemoglobin up to 8.3 > 8.1 >7.5.   -Hemoglobin further drifted down to 6.6 on 7/28, likely due to hematuria, transfusing a unit of PRBC. -Follow CBCs closely and transfuse for hemoglobin of 7 g or less.   Persistent chronic blood loss/Gross hematuria Patient continues to have moderate gross hematuria without clots in the bag. S/p cystoscopy, clot evacuation and fulguration of prostate. S/p continuous bladder  irrigation-discontinued 7/25. Urology continues to closely follow patient.  Suspect that acute blood loss anemia is from GU source.  Recommend continuing to hold off CVL but leave both suprapubic and Foley catheter in place with plans to Urethral catheter in place SP tube to drainage on 7/29.  Fever Developed high-grade intermittent fever since 7/24. Unclear etiology without clear source based on symptomatology. UTI certainly possible, given complex GU history and interventions this admission.  However urine culture from 7/13, <10K colonies/insignificant growth. Repeat urine culture also showed insignificant growth, blood cultures negative to date, chest x-ray without pneumonia. Empirically started on IV ceftriaxone (day 2).  Defervesced.  No leukocytosis.   No sepsis features.  Transient tachycardia but not persistent.  No leukocytosis. As communicated with urology, although prostatic embolization can cause fever, UTIs not ruled out, and as communicated with Dr. Gloriann Loan, discontinued ceftriaxone and transitioned to Keflex for total of 7 days. No fevers.  Acute kidney injury Creatinine as low as 0.97 on 7/15.  Progressively worsening since 7/23 and up to 2.1. Probably multifactorial due to ongoing lisinopril use, may have received IV contrast for IR angiogram,?  Possible ATN from transient hypotension and may need to rule out UTI. Discontinued lisinopril, gentle IVF, renal ultrasound without obstruction. Avoid nephrotoxic meds/agents. Creatinine has normalized.  AKI resolved.   History of DVT (deep vein thrombosis) not anticoagulated due to ongoing bleeding LLE in the Setting of prostate cancer.  Complicated by chronic hematuria.  Underwent IVC filter placement on 12/10/2021.   Prediabetes ([place]) Diet-controlled A1c 5.1 on 12/09/2021 from 5.9 on 05/30/2021. CBGs mostly in the 100s.   HTN (hypertension) The patient  is normotensive on home metoprolol.  Lisinopril held.   Malignant neoplasm of  prostate [facility]) Appreciate urology's assistance. Pt will follow up with oncology as outpatient.   Paroxysmal atrial fibrillation ([place]) Off Eliquis since February.  Deemed not a good candidate due to chronic hematuria.   Rate controlled on Toprol-XL.   DVT prophylaxis: SCD's Code Status: Full Code Family Communication: None at bedside. Disposition Plan: DC home pending improvement in hematuria and stability of hemoglobin.  Above interventions per urology for tomorrow.  May be discharged for 7/30.   Status is: Inpatient The patient requires at least 2 midnights for further evaluation and treatment of present condition.  Subjective: Limited and poor historian.  Denies complaints.  Did not complain of dizziness, weakness, lightheadedness, chest pain or dyspnea.  Objective: Vitals:   01/26/22 0845 01/26/22 0850 01/26/22 1130 01/26/22 1243  BP: 126/72 115/77 108/68 115/70  Pulse: 77 80 66   Resp: '18 20 20   '$ Temp: (!) 97 F (36.1 C) (!) 97.3 F (36.3 C) 98.7 F (37.1 C)   TempSrc: Oral Oral Oral   SpO2: 100% 99% 100%   Weight:      Height:        Intake/Output Summary (Last 24 hours) at 01/26/2022 1244 Last data filed at 01/26/2022 1130 Gross per 24 hour  Intake 350 ml  Output 900 ml  Net -550 ml   Filed Weights   01/11/22 1907 01/17/22 0719  Weight: 88.5 kg 88.5 kg    Exam:  General: Elderly male, moderately built and frail lying comfortably supine in bed without distress. Cardiovascular: S1 and S2 heard, RRR.  No JVD, murmurs or pedal edema.  Telemetry has been discontinued. Respiratory: Slightly diminished breath sounds in the bases.  Otherwise clear to auscultation.  No crackles, wheezing or rhonchi. Abdomen: Soft nontender nondistended with normal bowel sounds x4 quadrants.  Foley catheter mild hematuria.  SP catheter in place connected to CBL infusion but CBL is off. Musculoskeletal: Symmetric movement of all extremities. Skin: Not examined today. Psychiatry:  Judgment and insight appear impaired.  Mood appropriate.   Data Reviewed: CBC: Recent Labs  Lab 01/22/22 0448 01/22/22 2324 01/23/22 0159 01/23/22 2110 01/24/22 0442 01/25/22 0516 01/26/22 0536  WBC 10.3 11.4*  --   --  9.7 9.2 8.6  NEUTROABS 7.9*  --   --   --  7.2  --   --   HGB 7.6* 7.0* 6.7* 8.3* 8.1* 7.5* 6.6*  HCT 24.5* 23.1* 22.0* 26.5* 25.6* 23.6* 21.3*  MCV 88.8 89.2  --   --  87.7 88.1 87.7  PLT 208 213  --   --  191 211 008   Basic Metabolic Panel: Recent Labs  Lab 01/21/22 0844 01/22/22 0448 01/24/22 0442 01/25/22 0516 01/26/22 0536  NA 138 136 139 137 136  K 4.1 4.9 4.4 4.6 4.7  CL 107 105 105 105 106  CO2 '22 23 24 23 23  '$ GLUCOSE 108* 101* 111* 115* 99  BUN 26* 26* 53* 40* 33*  CREATININE 1.26* 1.29* 2.10* 1.51* 1.13  CALCIUM 8.6* 8.7* 9.3 8.7* 8.3*  MG  --  2.1 3.0*  --   --   PHOS  --   --  5.3*  --   --    GFR: Estimated Creatinine Clearance: 60.9 mL/min (by C-G formula based on SCr of 1.13 mg/dL). Liver Function Tests: Recent Labs  Lab 01/21/22 0844  AST 20  ALT 14  ALKPHOS 44  BILITOT 0.7  PROT  6.1*  ALBUMIN 2.8*    CBG: Recent Labs  Lab 01/25/22 1109 01/25/22 1625 01/25/22 2010 01/26/22 0801 01/26/22 1125  GLUCAP 156* 98 121* 110* 149*    Urine analysis:    Component Value Date/Time   COLORURINE RED (A) 01/11/2022 1905   APPEARANCEUR TURBID (A) 01/11/2022 1905   LABSPEC  01/11/2022 1905    TEST NOT REPORTED DUE TO COLOR INTERFERENCE OF URINE PIGMENT   PHURINE  01/11/2022 1905    TEST NOT REPORTED DUE TO COLOR INTERFERENCE OF URINE PIGMENT   GLUCOSEU (A) 01/11/2022 1905    TEST NOT REPORTED DUE TO COLOR INTERFERENCE OF URINE PIGMENT   HGBUR (A) 01/11/2022 1905    TEST NOT REPORTED DUE TO COLOR INTERFERENCE OF URINE PIGMENT   BILIRUBINUR (A) 01/11/2022 1905    TEST NOT REPORTED DUE TO COLOR INTERFERENCE OF URINE PIGMENT   KETONESUR (A) 01/11/2022 1905    TEST NOT REPORTED DUE TO COLOR INTERFERENCE OF URINE PIGMENT    PROTEINUR (A) 01/11/2022 1905    TEST NOT REPORTED DUE TO COLOR INTERFERENCE OF URINE PIGMENT   NITRITE (A) 01/11/2022 1905    TEST NOT REPORTED DUE TO COLOR INTERFERENCE OF URINE PIGMENT   LEUKOCYTESUR (A) 01/11/2022 1905    TEST NOT REPORTED DUE TO COLOR INTERFERENCE OF URINE PIGMENT    Recent Results (from the past 240 hour(s))  Surgical pcr screen     Status: None   Collection Time: 01/16/22 10:36 PM   Specimen: Nasal Mucosa; Nasal Swab  Result Value Ref Range Status   MRSA, PCR NEGATIVE NEGATIVE Final   Staphylococcus aureus NEGATIVE NEGATIVE Final    Comment: (NOTE) The Xpert SA Assay (FDA approved for NASAL specimens in patients 37 years of age and older), is one component of a comprehensive surveillance program. It is not intended to diagnose infection nor to guide or monitor treatment. Performed at [name], [place] [address] [place], [place] [address]   Urine Culture     Status: Abnormal   Collection Time: 01/24/22  9:15 AM   Specimen: Urine, Catheterized  Result Value Ref Range Status   Specimen Description   Final    URINE, CATHETERIZED Performed at [facility] [address] [place], [place] [address]    Special Requests   Final    NONE Performed at [facility], [place] [address] [place], [place] [address]    Culture (A)  Final    <10,000 COLONIES/mL INSIGNIFICANT GROWTH Performed at [facility] [address], [place], [place] [address]    Report Status 01/25/2022 FINAL  Final  Culture, blood (Routine X 2) w Reflex to ID Panel     Status: None (Preliminary result)   Collection Time: 01/24/22  9:34 AM   Specimen: BLOOD  Result Value Ref Range Status   Specimen Description   Final    BLOOD LEFT ANTECUBITAL Performed at [facility] [address], [place], [place] [address]    Special Requests   Final    BOTTLES DRAWN AEROBIC ONLY Blood Culture adequate  volume Performed at [facility] [address] [place], [place] [address]    Culture   Final    NO GROWTH 2 DAYS Performed at [facility] [address] [place], [place] [address]    Report Status PENDING  Incomplete  Culture, blood (Routine X 2) w Reflex to ID Panel     Status: None (Preliminary  result)   Collection Time: 01/24/22  9:34 AM   Specimen: BLOOD LEFT HAND  Result Value Ref Range Status   Specimen Description   Final    BLOOD LEFT HAND Performed at [facility] [address] [place], [place] [address]    Special Requests   Final    BOTTLES DRAWN AEROBIC ONLY Blood Culture adequate volume Performed at [facility] [address] [place], [place] [address]    Culture   Final    NO GROWTH 2 DAYS Performed at [facility] [address], [place], [place] [address]    Report Status PENDING  Incomplete      Studies: No results found.  Scheduled Meds:  cephALEXin  500 mg Oral Q8H   Chlorhexidine Gluconate Cloth  6 each Topical Daily   finasteride  5 mg Oral Daily   folic acid  1 mg Oral Daily   gabapentin  100 mg Oral TID   insulin aspart  0-15 Units Subcutaneous TID WC   insulin aspart  0-5 Units Subcutaneous QHS   insulin aspart  4 Units Subcutaneous TID WC   magnesium oxide  400 mg Oral Daily   metoprolol succinate  100 mg Oral Daily   cyanocobalamin  1,000 mcg Oral Daily    Continuous Infusions:  sodium chloride irrigation       LOS: 14 days     Vernell Leep, MD,  FACP, [facility], [facility], [facility] (Care Management Physician Certified) [place]  To contact the attending provider between 7A-7P or the covering provider during after hours 7P-7A, please log into the web site [email or web address] and access using universal [place] password for that web site. If you do not have the password, please call the hospital operator.

## 2022-01-27 ENCOUNTER — Inpatient Hospital Stay (HOSPITAL_COMMUNITY): Payer: Medicare Other | Admitting: Anesthesiology

## 2022-01-27 ENCOUNTER — Encounter (HOSPITAL_COMMUNITY)
Admission: EM | Disposition: A | Discharge status: Home Health | Payer: Self-pay | Source: Home / Self Care | Attending: Internal Medicine

## 2022-01-27 DIAGNOSIS — R310 Gross hematuria: Secondary | ICD-10-CM

## 2022-01-27 DIAGNOSIS — I4891 Unspecified atrial fibrillation: Secondary | ICD-10-CM

## 2022-01-27 DIAGNOSIS — I10 Essential (primary) hypertension: Secondary | ICD-10-CM

## 2022-01-27 DIAGNOSIS — Z87891 Personal history of nicotine dependence: Secondary | ICD-10-CM

## 2022-01-27 DIAGNOSIS — N179 Acute kidney failure, unspecified: Secondary | ICD-10-CM | POA: Diagnosis not present

## 2022-01-27 DIAGNOSIS — D62 Acute posthemorrhagic anemia: Secondary | ICD-10-CM | POA: Diagnosis not present

## 2022-01-27 DIAGNOSIS — D649 Anemia, unspecified: Secondary | ICD-10-CM | POA: Diagnosis not present

## 2022-01-27 HISTORY — PX: CYSTOSCOPY WITH FULGERATION: SHX6638

## 2022-01-27 LAB — CBC
HCT: 23.2 % — ABNORMAL LOW (ref 39.0–52.0)
Hemoglobin: 7.3 g/dL — ABNORMAL LOW (ref 13.0–17.0)
MCH: 27.9 pg (ref 26.0–34.0)
MCHC: 31.5 g/dL (ref 30.0–36.0)
MCV: 88.5 fL (ref 80.0–100.0)
Platelets: 280 10*3/uL (ref 150–400)
RBC: 2.62 MIL/uL — ABNORMAL LOW (ref 4.22–5.81)
RDW: 17.7 % — ABNORMAL HIGH (ref 11.5–15.5)
WBC: 9.9 10*3/uL (ref 4.0–10.5)
nRBC: 0 % (ref 0.0–0.2)

## 2022-01-27 LAB — BASIC METABOLIC PANEL
Anion gap: 9 (ref 5–15)
BUN: 24 mg/dL — ABNORMAL HIGH (ref 8–23)
CO2: 21 mmol/L — ABNORMAL LOW (ref 22–32)
Calcium: 8.3 mg/dL — ABNORMAL LOW (ref 8.9–10.3)
Chloride: 108 mmol/L (ref 98–111)
Creatinine, Ser: 1.11 mg/dL (ref 0.61–1.24)
GFR, Estimated: >60 mL/min (ref >60)
Glucose, Bld: 102 mg/dL — ABNORMAL HIGH (ref 70–99)
Potassium: 4.9 mmol/L (ref 3.5–5.1)
Sodium: 138 mmol/L (ref 135–145)

## 2022-01-27 LAB — PREPARE RBC (CROSSMATCH)

## 2022-01-27 LAB — GLUCOSE, CAPILLARY
Glucose-Capillary: 105 mg/dL — ABNORMAL HIGH (ref 70–99)
Glucose-Capillary: 126 mg/dL — ABNORMAL HIGH (ref 70–99)
Glucose-Capillary: 116 mg/dL — ABNORMAL HIGH (ref 70–99)
Glucose-Capillary: 126 mg/dL — ABNORMAL HIGH (ref 70–99)
Glucose-Capillary: 133 mg/dL — ABNORMAL HIGH (ref 70–99)
Glucose-Capillary: 165 mg/dL — ABNORMAL HIGH (ref 70–99)

## 2022-01-27 LAB — HEMOGLOBIN AND HEMATOCRIT, BLOOD
HCT: 27.3 % — ABNORMAL LOW (ref 39.0–52.0)
Hemoglobin: 8.7 g/dL — ABNORMAL LOW (ref 13.0–17.0)

## 2022-01-27 SURGERY — CYSTOSCOPY, WITH BLADDER FULGURATION
Anesthesia: General | Site: Bladder

## 2022-01-27 MED ORDER — OXYCODONE HCL 5 MG PO TABS: 5.0000 mg | ORAL_TABLET | Freq: Once | ORAL | Status: DC | PRN

## 2022-01-27 MED ORDER — LACTATED RINGERS IV SOLN: INTRAVENOUS | Status: DC | PRN

## 2022-01-27 MED ORDER — CIPROFLOXACIN IN D5W 400 MG/200ML IV SOLN
INTRAVENOUS | Status: DC | PRN
  Administered 2022-01-27: 400 mg via INTRAVENOUS

## 2022-01-27 MED ORDER — DEXAMETHASONE SODIUM PHOSPHATE 10 MG/ML IJ SOLN
INTRAMUSCULAR | Status: DC | PRN
  Administered 2022-01-27: 4 mg via INTRAVENOUS

## 2022-01-27 MED ORDER — FENTANYL CITRATE (PF) 250 MCG/5ML IJ SOLN: INTRAMUSCULAR | Status: DC | PRN

## 2022-01-27 MED ORDER — LIDOCAINE 2% (20 MG/ML) 5 ML SYRINGE
INTRAMUSCULAR | Status: DC | PRN
  Administered 2022-01-27: 60 mg via INTRAVENOUS

## 2022-01-27 MED ORDER — AMISULPRIDE (ANTIEMETIC) 5 MG/2ML IV SOLN: 10.0000 mg | Freq: Once | INTRAVENOUS | Status: DC | PRN

## 2022-01-27 MED ORDER — FENTANYL CITRATE (PF) 100 MCG/2ML IJ SOLN
INTRAMUSCULAR | Status: DC | PRN
  Administered 2022-01-27 (×4): 25 ug via INTRAVENOUS

## 2022-01-27 MED ORDER — PROPOFOL 10 MG/ML IV BOLUS
INTRAVENOUS | Status: DC | PRN
  Administered 2022-01-27: 110 mg via INTRAVENOUS

## 2022-01-27 MED ORDER — PHENYLEPHRINE HCL-NACL 20-0.9 MG/250ML-% IV SOLN
INTRAVENOUS | Status: DC | PRN
  Administered 2022-01-27: 40 ug/min via INTRAVENOUS

## 2022-01-27 MED ORDER — OXYCODONE HCL 5 MG PO TABS: 5.0000 mg | ORAL_TABLET | Freq: Four times a day (QID) | ORAL | Status: DC | PRN

## 2022-01-27 MED ORDER — ONDANSETRON HCL 4 MG/2ML IJ SOLN: 4.0000 mg | Freq: Once | INTRAMUSCULAR | Status: DC | PRN

## 2022-01-27 MED ORDER — SODIUM CHLORIDE 0.9 % IR SOLN
Status: DC | PRN
  Administered 2022-01-27: 1000 mL

## 2022-01-27 MED ORDER — OXYCODONE HCL 5 MG/5ML PO SOLN: 5.0000 mg | Freq: Once | ORAL | Status: DC | PRN

## 2022-01-27 MED ORDER — ACETAMINOPHEN 325 MG PO TABS: 650.0000 mg | ORAL_TABLET | Freq: Four times a day (QID) | ORAL | Status: DC | PRN

## 2022-01-27 MED ORDER — CIPROFLOXACIN IN D5W 400 MG/200ML IV SOLN
INTRAVENOUS | Status: AC
  Filled 2022-01-27: qty 200

## 2022-01-27 MED ORDER — SODIUM CHLORIDE 0.9% IV SOLUTION: Freq: Once | INTRAVENOUS | Status: AC

## 2022-01-27 MED ORDER — STERILE WATER FOR IRRIGATION IR SOLN
Status: DC | PRN
  Administered 2022-01-27: 3000 mL

## 2022-01-27 MED ORDER — FENTANYL CITRATE (PF) 100 MCG/2ML IJ SOLN
INTRAMUSCULAR | Status: AC
  Filled 2022-01-27: qty 2

## 2022-01-27 MED ORDER — SODIUM CHLORIDE 0.9 % IR SOLN
Status: DC | PRN
  Administered 2022-01-27: 3000 mL

## 2022-01-27 MED ORDER — FENTANYL CITRATE PF 50 MCG/ML IJ SOSY: 25.0000 ug | PREFILLED_SYRINGE | INTRAMUSCULAR | Status: DC | PRN

## 2022-01-27 SURGICAL SUPPLY — 25 items
BAG URINE DRAIN 2000ML AR STRL (UROLOGICAL SUPPLIES) ×1 IMPLANT
BAG URO CATCHER STRL LF (MISCELLANEOUS) ×2 IMPLANT
DRAPE FOOT SWITCH (DRAPES) ×2 IMPLANT
ELECT REM PT RETURN 15FT ADLT (MISCELLANEOUS) ×2 IMPLANT
GLOVE BIOGEL PI IND STRL 7.0 (GLOVE) IMPLANT
GLOVE BIOGEL PI IND STRL 7.5 (GLOVE) IMPLANT
GLOVE BIOGEL PI INDICATOR 7.0 (GLOVE) ×1
GLOVE BIOGEL PI INDICATOR 7.5 (GLOVE) ×1
GLOVE ECLIPSE 6.5 STRL STRAW (GLOVE) ×1 IMPLANT
GLOVE SURG LX 7.5 STRW (GLOVE) ×1
GLOVE SURG LX STRL 7.5 STRW (GLOVE) ×1 IMPLANT
GOWN STRL REUS W/ TWL LRG LVL3 (GOWN DISPOSABLE) ×2 IMPLANT
GOWN STRL REUS W/ TWL XL LVL3 (GOWN DISPOSABLE) ×1 IMPLANT
GOWN STRL REUS W/TWL LRG LVL3 (GOWN DISPOSABLE) ×2
GOWN STRL REUS W/TWL XL LVL3 (GOWN DISPOSABLE) ×1
KIT TURNOVER KIT A (KITS) ×1 IMPLANT
LOOP CUT BIPOLAR 24F LRG (ELECTROSURGICAL) IMPLANT
MANIFOLD NEPTUNE II (INSTRUMENTS) ×2 IMPLANT
NDL SAFETY ECLIPSE 18X1.5 (NEEDLE) ×1 IMPLANT
NEEDLE HYPO 18GX1.5 SHARP (NEEDLE) ×1
PACK CYSTO (CUSTOM PROCEDURE TRAY) ×2 IMPLANT
SYR TOOMEY IRRIG 70ML (MISCELLANEOUS) ×2
SYRINGE TOOMEY IRRIG 70ML (MISCELLANEOUS) IMPLANT
TUBING CONNECTING 10 (TUBING) ×2 IMPLANT
TUBING UROLOGY SET (TUBING) ×3 IMPLANT

## 2022-01-27 NOTE — Progress Notes (Signed)
Operative findings:  #1: The patient had a large clot within the bladder, approximately 250 cc.  That was irrigated out fairly easily. #2: The patient had what appeared to be some dystrophic calcifications along the left lateral wall of his bladder, and within that there was one specific vessel that was oozing.  I was able to easily fulgurate that.  Once that was done there was no additional bleeding in the bladder and everything appeared dry. #3: As I pulled out of the urethra I did fulgurate several areas along the lateral wall of his prostate to minimize any oozing. #4: I opted to leave out any urethral catheter and leave his suprapubic tube to gravity.  No CBI was initiated.   We will continue to follow closely.

## 2022-01-27 NOTE — Anesthesia Procedure Notes (Signed)
Procedure Name: LMA Insertion Date/Time: 01/27/2022 1:11 PM  Performed by: Cleda Daub, CRNAPre-anesthesia Checklist: Patient identified, Emergency Drugs available, Suction available and Patient being monitored Patient Re-evaluated:Patient Re-evaluated prior to induction Oxygen Delivery Method: Circle system utilized Preoxygenation: Pre-oxygenation with 100% oxygen Induction Type: IV induction LMA: LMA inserted LMA Size: 5.0 Number of attempts: 1 Placement Confirmation: positive ETCO2 and breath sounds checked- equal and bilateral Tube secured with: Tape Dental Injury: Teeth and Oropharynx as per pre-operative assessment

## 2022-01-27 NOTE — Transfer of Care (Signed)
Immediate Anesthesia Transfer of Care Note  Patient: Nathan [name]  Procedure(s) Performed: CYSTOSCOPY WITH FULGERATION WITH CLOT EVACUATION (Bladder)  Patient Location: PACU  Anesthesia Type:General  Level of Consciousness: drowsy  Airway & Oxygen Therapy: Patient Spontanous Breathing and Patient connected to face mask oxygen  Post-op Assessment: Report given to RN and Post -op Vital signs reviewed and stable  Post vital signs: Reviewed and stable  Last Vitals:  Vitals Value Taken Time  BP 119/70 01/27/22 1334  Temp    Pulse    Resp    SpO2    Vitals shown include unvalidated device data.  Last Pain:  Vitals:   01/27/22 1223  TempSrc: Oral  PainSc:       Patients Stated Pain Goal: 3 (57/32/20 2542)  Complications: No notable events documented.

## 2022-01-27 NOTE — Progress Notes (Signed)
Patient ID: Nathan [name], male   DOB: Jun 19, 1946, 76 y.o.   MRN: [id number]  10 Days Post-Op Subjective: Foley occluded yesterday afternoon - large volume of clots irrigated. Transferred 1 unit of packed red blood cells yesterday morning. On CBI overnight, vigorous drip, required hand irrigation once. This morning CBI was slowed down and his urine turned bloody quite quickly again.  Objective: Vital signs in last 24 hours: Temp:  [98 F (36.7 C)-101.6 F (38.7 C)] 99.5 F (37.5 C) (07/29 0500) Pulse Rate:  [66-85] 73 (07/28 2135) Resp:  [18-20] 20 (07/29 0415) BP: (108-140)/(68-75) 140/75 (07/29 0756) SpO2:  [100 %] 100 % (07/28 1243)  Intake/Output from previous day: 07/28 0701 - 07/29 0700 In: 78676 [P.O.:720; Blood:350] Out: 18400 [Urine:18400] Intake/Output this shift: No intake/output data recorded.  Physical Exam:  General: Alert and oriented GU: Urine catheter is maroon-colored on a slow CBI.  Lab Results: Recent Labs    01/26/22 0536 01/26/22 1226 01/27/22 0858  HGB 6.6* 7.6* 7.3*  HCT 21.3* 24.2* 23.2*   BMET Recent Labs    01/26/22 0536 01/27/22 0858  NA 136 138  K 4.7 4.9  CL 106 108  CO2 23 21*  GLUCOSE 99 102*  BUN 33* 24*  CREATININE 1.13 1.11  CALCIUM 8.3* 8.3*     Studies/Results: No results found.  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  Hematuria persistent and his hemoglobin continues to drop.  He is ordered to receive an additional unit of packed red blood cells this morning.  I spoke to the patient and his daughter at this morning about our inability to wean him off CBI and the ongoing bleeding.  I recommended we proceed to the operating room for clot evacuation and fulguration.  I went through this with the patient and his daughter in detail, they are eager to proceed, and hopeful that this will allow [place] to wean CBI more quickly.   LOS: 15 days   Ardis Hughs 01/27/2022, 10:07 AM

## 2022-01-27 NOTE — Progress Notes (Signed)
PROGRESS NOTE  Nathan [name] ZOX:[id number] DOB: [birth year] DOA: 01/11/2022 PCP: [name], MD  Hospital course: Nathan [name] is an 76 y.o. male past medical history significant for prostate cancer status post radioactive seed implant with suprapubic catheter and chronic hematuria, chronic atrial fibrillation not anticoagulated, diabetes mellitus type 2, recent history of DVT status post IVC filter who comes in for gross hematuria.  Went to his PCP where his hemoglobin was 6, was sent to the ED.  In the ED, Hb was 5.  Transfused with 4 units of PRBC's.  Seen by Urology, s/p cystoscopy with clot evacuation, fulguration of prostate, ongoing continuous bladder irrigation.  Course complicated by high fever of 102.1 F overnight 7/25 and 101.4 on 7/24 and AKI.  Clinically improved, defervesced.  ABLA, Hb down to 6.6 on 7/28, transfusing PRBC.   Assessment/Plan: Principal Problem:   Symptomatic anemia Active Problems:   Atrial fibrillation (HCC)   Malignant neoplasm of prostate (HCC)   HTN (hypertension)   DM2 (diabetes mellitus, type 2) (HCC)   History of DVT (deep vein thrombosis)   Gross hematuria   AKI (acute kidney injury) ([place])   Fever   Acute blood loss anemia (ABLA)  * Symptomatic anemia/acute blood loss anemia due to hematuria Secondary to acute on chronic hematuria, ongoing. -In the setting of prostate cancer s/p seeding/radiation  -chronic urinary retention s/p suprapubic catheter -Hemoglobin on presentation of 4.9. -S/p 6 units PRBC transfusion thus far. Despite transfusions, hemoglobin not appropriately up, hemoglobin 7.3.  Transfuse seventh unit PRBC on 7/29 and attempt to keep hemoglobin closer to 8 g or higher given ongoing hematuria. -Follow CBCs closely.   Persistent chronic blood loss/Gross hematuria Patient continues to have moderate gross hematuria without clots in the bag. S/p cystoscopy, clot evacuation and fulguration of prostate. S/p continuous bladder  irrigation-discontinued 7/25. Urology continues to closely follow patient.  Ongoing significant hematuria.  7/28 evening, urology were able to irrigate large volume of clots.  Unable to wean him off CBI. Cystoscopy on 7/29, approximately 250 mL large clot within the bladder, irrigated out; oozing vessel which was fulgurated, Foley catheter discontinued, left on his suprapubic catheter to gravity and no CBI initiated.  Urology will continue to follow.  Fever Developed high-grade intermittent fever since 7/24. Unclear etiology without clear source based on symptomatology. UTI certainly possible, given complex GU history and interventions this admission.  However urine culture from 7/13, <10K colonies/insignificant growth. Repeat urine culture also showed insignificant growth, blood cultures negative to date, chest x-ray without pneumonia. Empirically started on IV ceftriaxone (day 2).  Defervesced.  No leukocytosis.   No sepsis features.  Transient tachycardia but not persistent.  No leukocytosis. As communicated with urology, although prostatic embolization can cause fever, UTIs not ruled out, and as communicated with Dr. Gloriann Loan, discontinued ceftriaxone and transitioned to Keflex for total of 7 days. Had a fever of 101.6 last night but no leukocytosis.?  Related to hematuria itself.  Continue to monitor on current Keflex.  If he has further high fevers then may need further evaluation.  Acute kidney injury Creatinine as low as 0.97 on 7/15.  Progressively worsening since 7/23 and up to 2.1. Probably multifactorial due to ongoing lisinopril use, may have received IV contrast for IR angiogram,?  Possible ATN from transient hypotension and may need to rule out UTI. Discontinued lisinopril, gentle IVF, renal ultrasound without obstruction. Avoid nephrotoxic meds/agents. Creatinine has normalized.  AKI resolved.   History of DVT (deep vein thrombosis) not  anticoagulated due to ongoing bleeding LLE in  the Setting of prostate cancer.  Complicated by chronic hematuria.  Underwent IVC filter placement on 12/10/2021.   Prediabetes ([place]) Diet-controlled A1c 5.1 on 12/09/2021 from 5.9 on 05/30/2021. CBGs mostly in the 100s.   HTN (hypertension) The patient is normotensive on home metoprolol.  Lisinopril held.   Malignant neoplasm of prostate [facility]) Appreciate urology's assistance. Pt will follow up with oncology as outpatient.   Paroxysmal atrial fibrillation ([place]) Off Eliquis since February.  Deemed not a good candidate due to chronic hematuria.   Rate controlled on Toprol-XL.   DVT prophylaxis: SCD's Code Status: Full Code Family Communication: Discussed in detail with patient's daughter at bedside.  This was before the procedure.  Updated care and answered all questions. Disposition Plan: DC home pending improvement in hematuria and stability of hemoglobin.  Above interventions per urology for tomorrow.  May be discharged for 7/30.   Status is: Inpatient The patient requires at least 2 midnights for further evaluation and treatment of present condition.  Subjective: Seen this morning prior to procedure.  Really did not have any complaints but he is a poor historian.  Daughter indicated that she was out on a cruise to [place] and hence was unable to return my call.  Patient was also supposed to go but could not because of hospitalization.  Objective: Vitals:   01/27/22 1403 01/27/22 1412 01/27/22 1415 01/27/22 1426  BP: 125/68  125/75 128/77  Pulse: 72 73 71 66  Resp: '16  16 16  '$ Temp: 97.9 F (36.6 C)   98.4 F (36.9 C)  TempSrc:    Temporal  SpO2:  100% 100% 99%  Weight:      Height:        Intake/Output Summary (Last 24 hours) at 01/27/2022 1524 Last data filed at 01/27/2022 1418 Gross per 24 hour  Intake 15960 ml  Output 18750 ml  Net -2790 ml   Filed Weights   01/11/22 1907 01/17/22 0719  Weight: 88.5 kg 88.5 kg    Exam:  General: Elderly male, moderately built  and frail lying comfortably supine in bed without distress. Cardiovascular: S1 and S2 heard, RRR.  No JVD, murmurs or pedal edema. Respiratory: Slightly diminished breath sounds in the bases.  Otherwise clear to auscultation.  No crackles, wheezing or rhonchi. Abdomen: Soft nontender nondistended with normal bowel sounds x4 quadrants.  Foley catheter in place with moderate hematuria but no clots.  SP catheter in place connected to CBL infusion but CBL is off. Musculoskeletal: Symmetric movement of all extremities. Skin: Not examined today. Psychiatry: Judgment and insight appear impaired.  Mood appropriate.   Data Reviewed: CBC: Recent Labs  Lab 01/22/22 0448 01/22/22 2324 01/24/22 0442 01/25/22 0516 01/26/22 0536 01/26/22 1226 01/27/22 0858  WBC 10.3   < > 9.7 9.2 8.6 9.1 9.9  NEUTROABS 7.9*  --  7.2  --   --   --   --   HGB 7.6*   < > 8.1* 7.5* 6.6* 7.6* 7.3*  HCT 24.5*   < > 25.6* 23.6* 21.3* 24.2* 23.2*  MCV 88.8   < > 87.7 88.1 87.7 86.7 88.5  PLT 208   < > 191 211 228 241 280   < > = values in this interval not displayed.   Basic Metabolic Panel: Recent Labs  Lab 01/22/22 0448 01/24/22 0442 01/25/22 0516 01/26/22 0536 01/27/22 0858  NA 136 139 137 136 138  K 4.9 4.4 4.6 4.7 4.9  CL 105 105 105 106 108  CO2 '23 24 23 23 '$ 21*  GLUCOSE 101* 111* 115* 99 102*  BUN 26* 53* 40* 33* 24*  CREATININE 1.29* 2.10* 1.51* 1.13 1.11  CALCIUM 8.7* 9.3 8.7* 8.3* 8.3*  MG 2.1 3.0*  --   --   --   PHOS  --  5.3*  --   --   --    GFR: Estimated Creatinine Clearance: 62 mL/min (by C-G formula based on SCr of 1.11 mg/dL). Liver Function Tests: Recent Labs  Lab 01/21/22 0844  AST 20  ALT 14  ALKPHOS 44  BILITOT 0.7  PROT 6.1*  ALBUMIN 2.8*    CBG: Recent Labs  Lab 01/26/22 1815 01/26/22 2133 01/27/22 0724 01/27/22 1205 01/27/22 1335  GLUCAP 97 128* 105* 126* 116*    Urine analysis:    Component Value Date/Time   COLORURINE RED (A) 01/11/2022 1905    APPEARANCEUR TURBID (A) 01/11/2022 1905   LABSPEC  01/11/2022 1905    TEST NOT REPORTED DUE TO COLOR INTERFERENCE OF URINE PIGMENT   PHURINE  01/11/2022 1905    TEST NOT REPORTED DUE TO COLOR INTERFERENCE OF URINE PIGMENT   GLUCOSEU (A) 01/11/2022 1905    TEST NOT REPORTED DUE TO COLOR INTERFERENCE OF URINE PIGMENT   HGBUR (A) 01/11/2022 1905    TEST NOT REPORTED DUE TO COLOR INTERFERENCE OF URINE PIGMENT   BILIRUBINUR (A) 01/11/2022 1905    TEST NOT REPORTED DUE TO COLOR INTERFERENCE OF URINE PIGMENT   KETONESUR (A) 01/11/2022 1905    TEST NOT REPORTED DUE TO COLOR INTERFERENCE OF URINE PIGMENT   PROTEINUR (A) 01/11/2022 1905    TEST NOT REPORTED DUE TO COLOR INTERFERENCE OF URINE PIGMENT   NITRITE (A) 01/11/2022 1905    TEST NOT REPORTED DUE TO COLOR INTERFERENCE OF URINE PIGMENT   LEUKOCYTESUR (A) 01/11/2022 1905    TEST NOT REPORTED DUE TO COLOR INTERFERENCE OF URINE PIGMENT    Recent Results (from the past 240 hour(s))  Urine Culture     Status: Abnormal   Collection Time: 01/24/22  9:15 AM   Specimen: Urine, Catheterized  Result Value Ref Range Status   Specimen Description   Final    URINE, CATHETERIZED Performed at [facility], [place] [address], [place], [place] [address]    Special Requests   Final    NONE Performed at [facility], [place] [address] [place], [place] [address]    Culture (A)  Final    <10,000 COLONIES/mL INSIGNIFICANT GROWTH Performed at [facility] [address], [place], [place] [address]    Report Status 01/25/2022 FINAL  Final  Culture, blood (Routine X 2) w Reflex to ID Panel     Status: None (Preliminary result)   Collection Time: 01/24/22  9:34 AM   Specimen: BLOOD  Result Value Ref Range Status   Specimen Description   Final    BLOOD LEFT ANTECUBITAL Performed at [facility] [address] [place], [place] [address]    Special Requests   Final    BOTTLES DRAWN AEROBIC  ONLY Blood Culture adequate volume Performed at [facility] [address], [place], [place] [address]    Culture   Final    NO GROWTH 3 DAYS Performed at [facility], [place] [address], [place], [place] [address]    Report Status PENDING  Incomplete  Culture, blood (Routine X 2) w Reflex to ID  Panel     Status: None (Preliminary result)   Collection Time: 01/24/22  9:34 AM   Specimen: BLOOD LEFT HAND  Result Value Ref Range Status   Specimen Description   Final    BLOOD LEFT HAND Performed at [facility] [address], [place], [place] [address]    Special Requests   Final    BOTTLES DRAWN AEROBIC ONLY Blood Culture adequate volume Performed at [facility] [address], [place], [place] [address]    Culture   Final    NO GROWTH 3 DAYS Performed at [facility], [place] [address] [place], [place] [address]    Report Status PENDING  Incomplete      Studies: No results found.  Scheduled Meds:  cephALEXin  500 mg Oral Q8H   Chlorhexidine Gluconate Cloth  6 each Topical Daily   finasteride  5 mg Oral Daily   folic acid  1 mg Oral Daily   gabapentin  100 mg Oral TID   insulin aspart  0-15 Units Subcutaneous TID WC   insulin aspart  0-5 Units Subcutaneous QHS   insulin aspart  4 Units Subcutaneous TID WC   magnesium oxide  400 mg Oral Daily   metoprolol succinate  100 mg Oral Daily   cyanocobalamin  1,000 mcg Oral Daily    Continuous Infusions:  sodium chloride irrigation       LOS: 15 days     Vernell Leep, MD,  FACP, [facility], [facility], [facility] (Care Management Physician Certified) [place]  To contact the attending provider between 7A-7P or the covering provider during after hours 7P-7A, please log into the web site [email or web address] and access using universal [place] password for that web site. If you do not have the password, please call the hospital  operator.

## 2022-01-27 NOTE — Anesthesia Preprocedure Evaluation (Signed)
Anesthesia Evaluation  Patient identified by MRN, date of birth, ID band Patient awake    Reviewed: Allergy & Precautions, NPO status , Patient's Chart, lab work & pertinent test results  History of Anesthesia Complications Negative for: history of anesthetic complications  Airway Mallampati: II  TM Distance: >3 FB Neck ROM: Full    Dental  (+) Edentulous Upper, Missing, Dental Advisory Given   Pulmonary neg pulmonary ROS, former smoker,    Pulmonary exam normal        Cardiovascular hypertension, + DVT (s/p IVC filter)  Normal cardiovascular exam+ dysrhythmias ([place]) Atrial Fibrillation      Neuro/Psych negative neurological ROS     GI/Hepatic negative GI ROS, Neg liver ROS,   Endo/Other  diabetes, Type 2  Renal/GU ARFRenal disease (AKI- resolved, Cr 1.11)   Hematuria Prostate cancer negative genitourinary   Musculoskeletal negative musculoskeletal ROS (+)   Abdominal   Peds  Hematology  (+) Blood dyscrasia, anemia ,   Anesthesia Other Findings   Reproductive/Obstetrics                            Anesthesia Physical Anesthesia Plan  ASA: 3 and emergent  Anesthesia Plan: General   Post-op Pain Management:    Induction: Intravenous  PONV Risk Score and Plan: 2 and Ondansetron, Dexamethasone, Midazolam and Treatment may vary due to age or medical condition  Airway Management Planned: LMA  Additional Equipment: None  Intra-op Plan:   Post-operative Plan: Extubation in OR  Informed Consent: I have reviewed the patients History and Physical, chart, labs and discussed the procedure including the risks, benefits and alternatives for the proposed anesthesia with the patient or authorized representative who has indicated his/her understanding and acceptance.     Dental advisory given  Plan Discussed with:   Anesthesia Plan Comments:         Anesthesia Quick  Evaluation

## 2022-01-27 NOTE — Op Note (Signed)
Preoperative diagnosis:  Gross hematuria  Postoperative diagnosis:  Same  Procedure: Cystoscopy, clot evacuation Fulguration of bladder and prostatic bleeding  Surgeon: Ardis Hughs, MD  Anesthesia: General  Complications: None  Intraoperative findings:  #1: The patient had a large clot within the bladder, approximately 250 cc.  That was irrigated out fairly easily. #2: The patient had what appeared to be some dystrophic calcifications along the left lateral wall of his bladder, and within that there was one specific vessel that was oozing.  I was able to easily fulgurate that.  Once that was done there was no additional bleeding in the bladder and everything appeared dry. #3: As I pulled out of the urethra I did fulgurate several areas along the lateral wall of his prostate to minimize any oozing. #4: I opted to leave out any urethral catheter and leave his suprapubic tube to gravity.  No CBI was initiated.  EBL: Minimal  Specimens: None  Indication: Nathan [name] is a 76 y.o. patient with long history of enlarged obstructive prostate with a history of urinary retention as well as prostate cancer status post external beam radiation therapy who presented to the emergency department with clot retention and gross hematuria.  He has had a prolonged hospital course for refractory gross hematuria.  After reviewing the management options for treatment, he elected to proceed with the above surgical procedure(s). We have discussed the potential benefits and risks of the procedure, side effects of the proposed treatment, the likelihood of the patient achieving the goals of the procedure, and any potential problems that might occur during the procedure or recuperation. Informed consent has been obtained.  Description of procedure:  The patient was taken to the operating room and general anesthesia was induced.  The patient was placed in the dorsal lithotomy position, prepped and draped in  the usual sterile fashion, and preoperative antibiotics were administered. A preoperative time-out was performed.   21 French 30 degree cystoscope was gently passed to the patient's urethra and into the bladder under visual guidance.  Cystoscopy was performed noting a large clot within the bladder.  This was easily irrigated.  Once all clot was removed I was able to see much better, he had some catheter associated changes within the bladder as well as an area along the left lateral wall close to the bladder neck that was consistent with dystrophic calcifications and radiation induced changes.  Within this area was a small bleeding vessel which I was able to easily cauterize.  There are no additional vessels noted and the bladder was quite dry from any ongoing bleeding.  As I slowly backed out of the urethra the prostate was very friable, and I did fulgurate several areas along the left lateral lobe of his prostate.  However, there was no ongoing bleeding as I completed the procedure and at this time I opted not to replace his urethral Foley catheter.  Instead I opted to place his suprapubic tube to gravity.  The patient was subsequently extubated and returned to the PACU in excellent condition.  Ardis Hughs, M.D.

## 2022-01-27 NOTE — Anesthesia Postprocedure Evaluation (Signed)
Anesthesia Post Note  Patient: Yaqub Arney  Procedure(s) Performed: CYSTOSCOPY WITH FULGERATION WITH CLOT EVACUATION (Bladder)     Patient location during evaluation: PACU Anesthesia Type: General Level of consciousness: awake and alert Pain management: pain level controlled Vital Signs Assessment: post-procedure vital signs reviewed and stable Respiratory status: spontaneous breathing, nonlabored ventilation and respiratory function stable Cardiovascular status: blood pressure returned to baseline and stable Postop Assessment: no apparent nausea or vomiting Anesthetic complications: no   No notable events documented.  Last Vitals:  Vitals:   01/27/22 1412 01/27/22 1415  BP:  125/75  Pulse: 73 71  Resp:  16  Temp:    SpO2: 100% 100%    Last Pain:  Vitals:   01/27/22 1415  TempSrc:   PainSc: 0-No pain                 Lidia Collum

## 2022-01-28 ENCOUNTER — Encounter (HOSPITAL_COMMUNITY): Payer: Self-pay | Admitting: Urology

## 2022-01-28 ENCOUNTER — Encounter (HOSPITAL_COMMUNITY): Payer: Medicare Other

## 2022-01-28 ENCOUNTER — Inpatient Hospital Stay (HOSPITAL_COMMUNITY): Payer: Medicare Other

## 2022-01-28 DIAGNOSIS — D62 Acute posthemorrhagic anemia: Secondary | ICD-10-CM | POA: Diagnosis not present

## 2022-01-28 DIAGNOSIS — I82409 Acute embolism and thrombosis of unspecified deep veins of unspecified lower extremity: Secondary | ICD-10-CM

## 2022-01-28 DIAGNOSIS — N179 Acute kidney failure, unspecified: Secondary | ICD-10-CM | POA: Diagnosis not present

## 2022-01-28 DIAGNOSIS — D649 Anemia, unspecified: Secondary | ICD-10-CM | POA: Diagnosis not present

## 2022-01-28 DIAGNOSIS — R310 Gross hematuria: Secondary | ICD-10-CM | POA: Diagnosis not present

## 2022-01-28 DIAGNOSIS — Z9359 Other cystostomy status: Secondary | ICD-10-CM

## 2022-01-28 LAB — BASIC METABOLIC PANEL
Anion gap: 10 (ref 5–15)
Anion gap: 9 (ref 5–15)
BUN: 21 mg/dL (ref 8–23)
BUN: 22 mg/dL (ref 8–23)
CO2: 24 mmol/L (ref 22–32)
CO2: 24 mmol/L (ref 22–32)
Calcium: 8.5 mg/dL — ABNORMAL LOW (ref 8.9–10.3)
Calcium: 8.5 mg/dL — ABNORMAL LOW (ref 8.9–10.3)
Chloride: 102 mmol/L (ref 98–111)
Chloride: 104 mmol/L (ref 98–111)
Creatinine, Ser: 1.01 mg/dL (ref 0.61–1.24)
Creatinine, Ser: 1.04 mg/dL (ref 0.61–1.24)
GFR, Estimated: >60 mL/min (ref >60)
GFR, Estimated: >60 mL/min (ref >60)
Glucose, Bld: 135 mg/dL — ABNORMAL HIGH (ref 70–99)
Glucose, Bld: 126 mg/dL — ABNORMAL HIGH (ref 70–99)
Potassium: 5.3 mmol/L — ABNORMAL HIGH (ref 3.5–5.1)
Potassium: 5.1 mmol/L (ref 3.5–5.1)
Sodium: 136 mmol/L (ref 135–145)
Sodium: 137 mmol/L (ref 135–145)

## 2022-01-28 LAB — CBC
HCT: 26.3 % — ABNORMAL LOW (ref 39.0–52.0)
Hemoglobin: 8.5 g/dL — ABNORMAL LOW (ref 13.0–17.0)
MCH: 28 pg (ref 26.0–34.0)
MCHC: 32.3 g/dL (ref 30.0–36.0)
MCV: 86.5 fL (ref 80.0–100.0)
Platelets: 302 10*3/uL (ref 150–400)
RBC: 3.04 MIL/uL — ABNORMAL LOW (ref 4.22–5.81)
RDW: 17 % — ABNORMAL HIGH (ref 11.5–15.5)
WBC: 9.3 10*3/uL (ref 4.0–10.5)
nRBC: 0 % (ref 0.0–0.2)

## 2022-01-28 LAB — GLUCOSE, CAPILLARY
Glucose-Capillary: 132 mg/dL — ABNORMAL HIGH (ref 70–99)
Glucose-Capillary: 152 mg/dL — ABNORMAL HIGH (ref 70–99)

## 2022-01-28 MED ORDER — CEPHALEXIN 500 MG PO CAPS
500.0000 mg | ORAL_CAPSULE | Freq: Three times a day (TID) | ORAL | 0 refills | Status: AC
Start: 2022-01-28 — End: 2022-01-31

## 2022-01-28 NOTE — Discharge Summary (Addendum)
Physician Discharge Summary  Nathan [name] TIR:[id number] DOB: 10-25-1945  PCP: [name], MD  Admitted from: Home Discharged to: Home  Admit date: 01/11/2022 Discharge date: 01/28/2022  Recommendations for Outpatient Follow-up:    Follow-up Information     [name], MD. Schedule an appointment as soon as possible for a visit in 1 week(s).   Specialty: Family Medicine Why: To be seen with repeat labs (CBC & BMP). Contact information: [place]  [address] [phone]         Pixie Casino, MD .   Specialty: Cardiology Contact information: [place] [address] [phone]         Raynelle Bring, MD. Schedule an appointment as soon as possible for a visit.   Specialty: Urology Why: Call on 01/29/2022 for a follow-up appointment. Contact information: [place] [address] [phone]                  Home Health: None    Equipment/Devices: None    Discharge Condition: Improved and stable.   Code Status: Full Code Diet recommendation:  Discharge Diet Orders (From admission, onward)     Start     Ordered   01/28/22 0000  Diet - low sodium heart healthy        01/28/22 1624             Discharge Diagnoses:  Principal Problem:   Symptomatic anemia Active Problems:   Atrial fibrillation (HCC)   Malignant neoplasm of prostate (HCC)   HTN (hypertension)   DM2 (diabetes mellitus, type 2) (HCC)   History of DVT (deep vein thrombosis)   Gross hematuria   AKI (acute kidney injury) ([place])   Fever   Acute blood loss anemia (ABLA)   Brief Summary: Nathan [name] is an 76 y.o. male past medical history significant for prostate cancer status post radioactive seed implant with suprapubic catheter and chronic hematuria, chronic atrial fibrillation not anticoagulated, diabetes mellitus type 2, recent history of DVT status post IVC filter who comes in for gross  hematuria.  Went to his PCP where his hemoglobin was 6, was sent to the ED.  In the ED, Hb was 5.  Transfused with 4 units of PRBC's.  Seen by Urology, s/p cystoscopy with clot evacuation, fulguration of prostate, ongoing continuous bladder irrigation.  Course complicated by high fever of 102.1 F overnight 7/25 and 101.4 on 7/24 and AKI.  Clinically improved, defervesced.  ABLA, Hb down to 6.6 on 7/28, transfusing PRBC.  Due to ongoing hematuria, s/p cystoscopy on 7/29 with intervention.  Hematuria since then has almost resolved.  Hemoglobin stable post transfusions.  Awaiting RLE venous Doppler to evaluate leg swelling prior to DC home.     Assessment/Plan:  * Symptomatic anemia/acute blood loss anemia due to hematuria Secondary to acute on chronic hematuria -In the setting of prostate cancer s/p seeding/radiation  -chronic urinary retention s/p suprapubic catheter -Hemoglobin on presentation of 4.9. -S/p 7 units PRBC transfusion thus far. -Hemoglobin has finally stabilized in the mid 8 g range over the last 12 hours, most likely due to resolution of hematuria.   Persistent chronic blood loss/Gross hematuria Patient continues to have moderate gross hematuria without clots in the bag. S/p cystoscopy, clot evacuation and fulguration of prostate. S/p continuous bladder irrigation-discontinued 7/25. Urology continues to closely follow patient.  Ongoing significant hematuria.  7/28 evening, urology were able to irrigate large volume of  clots.  Unable to wean him off CBI. Cystoscopy on 7/29, approximately 250 mL large clot within the bladder, irrigated out; oozing vessel which was fulgurated, Foley catheter discontinued, left on his suprapubic catheter to gravity and no CBI initiated.   Evaluated patient along with Dr. Louis Meckel at bedside.  Hematuria resolved.  Hemoglobin stable.  He has cleared patient for discharge from urology perspective, continue SP tube at discharge and complete total 1 week of  antibiotics.   Fever Developed high-grade intermittent fever since 7/24. Unclear etiology without clear source based on symptomatology. UTI certainly possible, given complex GU history and interventions this admission.  However urine culture from 7/13, <10K colonies/insignificant growth. Repeat urine culture also showed insignificant growth, blood cultures negative to date, chest x-ray without pneumonia. Empirically started on IV ceftriaxone (day 2).  Defervesced.  No leukocytosis.   No sepsis features.  Transient tachycardia but not persistent.  No leukocytosis. As communicated with urology, although prostatic embolization can cause fever, UTIs not ruled out, and as communicated with Dr. Gloriann Loan, discontinued ceftriaxone and transitioned to Keflex for total of 7 days. Had a fever of 101.6 last 7/28 night but no leukocytosis.?  Related to hematuria itself.  No further fevers.  As noted above, complete total 1 week of antibiotics with Keflex at discharge.   Acute kidney injury Creatinine as low as 0.97 on 7/15.  Progressively worsening since 7/23 and up to 2.1. Probably multifactorial due to ongoing lisinopril use, may have received IV contrast for IR angiogram,?  Possible ATN from transient hypotension and may need to rule out UTI. Discontinued lisinopril, gentle IVF, renal ultrasound without obstruction. Avoid nephrotoxic meds/agents. Creatinine has normalized.  AKI resolved.  Discontinued lisinopril due to controlled blood pressure on Toprol-XL alone and risk of recurrent AKI.   History of DVT (deep vein thrombosis) not anticoagulated due to ongoing bleeding.  Now new extensive RLE DVT up to the IVC filter. LLE in the Setting of prostate cancer.  Complicated by chronic hematuria.  Underwent IVC filter placement on 12/10/2021. Does have extensive right lower extremity edema, mostly proximally without pain.  Concerned that he may have DVT in that extremity although will still have no options to  anticoagulate given his hematuria issues.  Already has an IVC filter.  Awaiting venous Doppler and even if it is positive for right lower extremity DVT, not on anticoagulation candidate. Discussed preliminary lower extremity venous Doppler results with vascular tech.  She indicates that patient has DVT on the left distal femoral and distally.  On right lower extremity has extensive DVT from the foot up to the IVC filter.  I updated patient's daughter with these results.   Prediabetes ([place]) Diet-controlled A1c 5.1 on 12/09/2021 from 5.9 on 05/30/2021.   HTN (hypertension) The patient is normotensive on home metoprolol.  Lisinopril discontinued as noted above.   Malignant neoplasm of prostate [facility]) Appreciate urology's assistance. Pt will follow up with oncology as outpatient-this can be coordinated by his urologist.   Paroxysmal atrial fibrillation ([place]) Off Eliquis since February.  Deemed not a good candidate due to chronic hematuria.   Rate controlled on Toprol-XL.  Mild hyperkalemia Repeated sample and potassium normal.  Unclear etiology.  ACE inhibitors already discontinued.   Consultations: Urology  Procedures: As noted above   Discharge Instructions  Discharge Instructions     Call MD for:   Complete by: As directed    Recurrent blood in urine/hematuria.   Call MD for:  difficulty breathing, headache or visual disturbances  Complete by: As directed    Call MD for:  extreme fatigue   Complete by: As directed    Call MD for:  persistant dizziness or light-headedness   Complete by: As directed    Call MD for:  persistant nausea and vomiting   Complete by: As directed    Call MD for:  severe uncontrolled pain   Complete by: As directed    Call MD for:  temperature >100.4   Complete by: As directed    Diet - low sodium heart healthy   Complete by: As directed    Discharge instructions   Complete by: As directed    Discharge with suprapubic catheter that he had prior  to admission.   Increase activity slowly   Complete by: As directed         Medication List     STOP taking these medications    lisinopril 40 MG tablet Commonly known as: ZESTRIL   VITAMIN D2 PO       TAKE these medications    cephALEXin 500 MG capsule Commonly known as: KEFLEX Take 1 capsule (500 mg total) by mouth 3 (three) times daily for 3 days.   cyanocobalamin 1000 MCG tablet Commonly known as: VITAMIN B12 Take 1,000 mcg by mouth daily.   finasteride 5 MG tablet Commonly known as: PROSCAR Take 5 mg by mouth daily.   folic acid 1 MG tablet Commonly known as: FOLVITE Take 1 mg by mouth daily.   gabapentin 300 MG capsule Commonly known as: NEURONTIN Take 300 mg by mouth 3 (three) times daily.   magnesium oxide 400 MG tablet Commonly known as: MAG-OX Take 400 mg by mouth daily.   metoprolol succinate 100 MG 24 hr tablet Commonly known as: TOPROL-XL Take 100 mg by mouth daily.   oxycodone 5 MG capsule Commonly known as: OXY-IR Take 5 mg by mouth every 6 (six) hours as needed for pain.   prenatal multivitamin Tabs tablet Take 1 tablet by mouth daily at 12 noon.       No Known Allergies    Procedures/Studies: US RENAL  Result Date: 01/24/2022 CLINICAL DATA:  Acute kidney injury EXAM: RENAL / URINARY TRACT ULTRASOUND COMPLETE COMPARISON:  CT scan 01/13/2022 FINDINGS: Right Kidney: Renal measurements: 11.0 by 5.5 by 6.0 cm = volume: 189 mL. 3.7 by 3.3 by 3.1 cm cyst of the right kidney upper pole, subtle internal echoes indicating minimal complexity, corresponding with Bosniak category 2. Renal echogenicity in the parenchyma is similar to that of the adjacent liver. Left Kidney: Renal measurements: 10.1 by 5.9 by 5.9 cm = volume: 182 mL. Left kidney upper pole 2.2 by 2.2 by 2.5 cm minimally complex renal cyst, Bosniak category 2. Mild left hydronephrosis. Bladder: The bladder is collapsed around the Foley catheter. Known prostatomegaly. Other: None.  IMPRESSION: 1. Mild left hydronephrosis. 2. Benign-appearing bilateral renal cysts. 3. Prostatomegaly.  Foley catheter in the urinary bladder. Electronically Signed   By: Van Clines M.D.   On: 01/24/2022 12:52   DG CHEST PORT 1 VIEW  Result Date: 01/24/2022 CLINICAL DATA:  Fever EXAM: PORTABLE CHEST 1 VIEW COMPARISON:  Chest x-ray dated December 09, 2021 FINDINGS: The heart size and mediastinal contours are within normal limits. Both lungs are clear. The visualized skeletal structures are unremarkable. IMPRESSION: No evidence of pneumonia. Electronically Signed   By: Yetta Glassman M.D.   On: 01/24/2022 09:44   IR Angiogram Pelvis Selective Or Supraselective  Result Date: 01/18/2022 INDICATION: Briefly, 76 year old  male comorbid with history of prostate cancer s/p brachytherapy and AFib previously on anticoagulation who presented with gross hematuria refractory to urologic intervention. EXAM: Procedures: 1. PROSTATE ARTERY EMBOLIZATION, LEFT 2. PELVIC ARTERIOGRAPHY and CONE BEAM CT ANGIOGRAPHY of the PELVIS MEDICATIONS: Rocephin 2 gm IV. The antibiotic was administered within one hour of the procedure ANESTHESIA/SEDATION: Moderate (conscious) sedation was employed during this procedure. A total of Versed 4.5 mg and Fentanyl 300 mcg was administered intravenously. Moderate Sedation Time: 154 minutes. The patient's level of consciousness and vital signs were monitored continuously by radiology nursing throughout the procedure under my direct supervision. CONTRAST:  19m OMNIPAQUE IOHEXOL 300 MG/ML SOLN, 240mOMNIPAQUE IOHEXOL 300 MG/ML SOLN, 2654mMNIPAQUE IOHEXOL 300 MG/ML SOLN, 33m21mNIPAQUE IOHEXOL 300 MG/ML SOLN FLUOROSCOPY TIME:  Fluoroscopic dose; 764 924 COMPLICATIONS: None immediate. PROCEDURE: Informed consent was obtained from the the patient and/or patient's representative following explanation of the procedure, risks, benefits and alternatives. The patient understands, agrees and consents  for the procedure. All questions were addressed. A time out was performed prior to the initiation of the procedure. Maximal barrier sterile technique utilized including caps, mask, sterile gowns, sterile gloves, large sterile drape, hand hygiene, and Betadine prep. The RIGHT femoral head was marked fluoroscopically. Under sterile conditions and local anesthesia, the RIGHT common femoral artery access was performed with a micropuncture needle. Under direct ultrasound guidance, the right common femoral was accessed with a micropuncture kit. An ultrasound image was saved for documentation purposes. This allowed for placement of a 5 Fr vascular sheath. A limited arteriogram was performed through the side arm of the sheath confirming appropriate access within the RIGHT common femoral artery. The 5 Fr C2 catheter was utilized to select the contralateral LEFT internal iliac artery then cone beam CT angiogram was performed, with post processing performed at a separate workstation. Additionally, selective LEFT prostatic arteriogram was performed followed by catheterization with a 1.9 Fr 165 cm Progreat lambda microcatheter and 0.016 inch Fathom micro guide wire. Access was adequate for embolization. Embolization was performed with 100-300 um and 300-500 um embosphere micro particles followed by Gelfoam slurry. Active extravasation was noted from the LEFT prostate into the urinary bladder. Post embolization angiogram confirms stasis of the LEFT prostatic vascular territory. The 5 Fr catheter was retracted and using a Waltman loop was inserted into the ipsilateral/RIGHT internal iliac artery then cone beam CT angiogram was performed. Additionally selective RIGHT prostatic arteriogram was also performed followed by catheterization using microcatheter and microwire. A common origin of the RIGHT obturator artery and prostatic artery was present with a challenging posteromedial angle of origin. The common origin of the obturator  and prostatic artery ended up with vasospasm and therefore we could not cannulate the RIGHT prostatic artery for safe embolization. The microcatheter and base catheter were retracted. Embolization was not performed on this side. CONE BEAM CT ANGIOGRAPHY: To utilize vascular tracking software, a cone beam CTA was then performed from the LEFT and RIGHT internal iliac arteries, and post processing was performed at a separate workstation. PURPOSE OF THE ARTERIOGRAMS: No previous catheter-directed angiogram was available. Therefore a new complete diagnostic angiography was performed. The decision to proceed with an interventional procedure was made based on this new diagnostic angiogram. ARTERIAL CLOSURE: At this point, all wires, catheters and sheaths were removed from the patient. The sheath and catheter were removed and hemostasis was achieved by Angio-Seal closure of the RIGHT common femoral artery access. The puncture site was be dressed in a sterile manner. The  patient tolerated the procedure well without immediate post procedural complication. FINDINGS: - access via the RIGHT common femoral artery. - Prostatic arteries arising from the anterior division, gluteo-pudendal trunk (Type II) on the LEFT and off the obturator artery on the RIGHT (Type III) - Active extravasation into urinary bladder on LEFT prostate artery arteriogram, consistent with hematuria - Successful microparticle prostate artery embolization (PAE) and Gelfoam slurry, with cross filling of the RIGHT prostatic gland. - Vasospasm of the shared origin of RIGHT obturator artery, unable to embolize via the RIGHT prostatic artery - AngioSeal closure at the RIGHT groin with distal RLE pulses at the end of the case. IMPRESSION: 1. Successful LEFT prostatic artery microparticle embolization for refractory hematuria via trans femoral approach. 2. 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 Embolization was not  performed on this side. PLAN: Continuous bladder irrigation (CBI) per Urology and continue to monitor for hematuria with serial H/h. Michaelle Birks, MD [facility] Radiology Electronically Signed   By: Michaelle Birks M.D.   On: 01/18/2022 08:59   IR US Guide Vasc Access Right  Result Date: 01/18/2022 INDICATION: Briefly, 76 year old male comorbid with history of prostate cancer s/p brachytherapy and AFib previously on anticoagulation who presented with gross hematuria refractory to urologic intervention. EXAM: Procedures: 1. PROSTATE ARTERY EMBOLIZATION, LEFT 2. PELVIC ARTERIOGRAPHY and CONE BEAM CT ANGIOGRAPHY of the PELVIS MEDICATIONS: Rocephin 2 gm IV. The antibiotic was administered within one hour of the procedure ANESTHESIA/SEDATION: Moderate (conscious) sedation was employed during this procedure. A total of Versed 4.5 mg and Fentanyl 300 mcg was administered intravenously. Moderate Sedation Time: 154 minutes. The patient's level of consciousness and vital signs were monitored continuously by radiology nursing throughout the procedure under my direct supervision. CONTRAST:  52m OMNIPAQUE IOHEXOL 300 MG/ML SOLN, 235mOMNIPAQUE IOHEXOL 300 MG/ML SOLN, 2660mMNIPAQUE IOHEXOL 300 MG/ML SOLN, 80m1mNIPAQUE IOHEXOL 300 MG/ML SOLN FLUOROSCOPY TIME:  Fluoroscopic dose; 764 419 COMPLICATIONS: None immediate. PROCEDURE: Informed consent was obtained from the the patient and/or patient's representative following explanation of the procedure, risks, benefits and alternatives. The patient understands, agrees and consents for the procedure. All questions were addressed. A time out was performed prior to the initiation of the procedure. Maximal barrier sterile technique utilized including caps, mask, sterile gowns, sterile gloves, large sterile drape, hand hygiene, and Betadine prep. The RIGHT femoral head was marked fluoroscopically. Under sterile conditions and local  anesthesia, the RIGHT common femoral artery access was performed with a micropuncture needle. Under direct ultrasound guidance, the right common femoral was accessed with a micropuncture kit. An ultrasound image was saved for documentation purposes. This allowed for placement of a 5 Fr vascular sheath. A limited arteriogram was performed through the side arm of the sheath confirming appropriate access within the RIGHT common femoral artery. The 5 Fr C2 catheter was utilized to select the contralateral LEFT internal iliac artery then cone beam CT angiogram was performed, with post processing performed at a separate workstation. Additionally, selective LEFT prostatic arteriogram was performed followed by catheterization with a 1.9 Fr 165 cm Progreat lambda microcatheter and 0.016 inch Fathom micro guide wire. Access was adequate for embolization. Embolization was performed with 100-300 um and 300-500 um embosphere micro particles followed by Gelfoam slurry. Active extravasation was noted from the LEFT prostate into the urinary bladder. Post embolization angiogram confirms stasis of the LEFT prostatic vascular territory. The 5 Fr catheter was retracted and using a Waltman loop  was inserted into the ipsilateral/RIGHT internal iliac artery then cone beam CT angiogram was performed. Additionally selective RIGHT prostatic arteriogram was also performed followed by catheterization using microcatheter and microwire. A common origin of the RIGHT obturator artery and prostatic artery was present with a challenging posteromedial angle of origin. The common origin of the obturator and prostatic artery ended up with vasospasm and therefore we could not cannulate the RIGHT prostatic artery for safe embolization. The microcatheter and base catheter were retracted. Embolization was not performed on this side. CONE BEAM CT ANGIOGRAPHY: To utilize vascular tracking software, a cone beam CTA was then performed from the LEFT and RIGHT  internal iliac arteries, and post processing was performed at a separate workstation. PURPOSE OF THE ARTERIOGRAMS: No previous catheter-directed angiogram was available. Therefore a new complete diagnostic angiography was performed. The decision to proceed with an interventional procedure was made based on this new diagnostic angiogram. ARTERIAL CLOSURE: At this point, all wires, catheters and sheaths were removed from the patient. The sheath and catheter were removed and hemostasis was achieved by Angio-Seal closure of the RIGHT common femoral artery access. The puncture site was be dressed in a sterile manner. The patient tolerated the procedure well without immediate post procedural complication. FINDINGS: - access via the RIGHT common femoral artery. - Prostatic arteries arising from the anterior division, gluteo-pudendal trunk (Type II) on the LEFT and off the obturator artery on the RIGHT (Type III) - Active extravasation into urinary bladder on LEFT prostate artery arteriogram, consistent with hematuria - Successful microparticle prostate artery embolization (PAE) and Gelfoam slurry, with cross filling of the RIGHT prostatic gland. - Vasospasm of the shared origin of RIGHT obturator artery, unable to embolize via the RIGHT prostatic artery - AngioSeal closure at the RIGHT groin with distal RLE pulses at the end of the case. IMPRESSION: 1. Successful LEFT prostatic artery microparticle embolization for refractory hematuria via trans femoral approach. 2. 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 Embolization was not performed on this side. PLAN: Continuous bladder irrigation (CBI) per Urology and continue to monitor for hematuria with serial H/h. Michaelle Birks, MD [facility] Radiology Electronically Signed   By: Michaelle Birks M.D.   On: 01/18/2022 08:59   IR 3D Independent Darreld Mclean  Result Date: 01/18/2022 INDICATION:  Briefly, 76 year old male comorbid with history of prostate cancer s/p brachytherapy and AFib previously on anticoagulation who presented with gross hematuria refractory to urologic intervention. EXAM: Procedures: 1. PROSTATE ARTERY EMBOLIZATION, LEFT 2. PELVIC ARTERIOGRAPHY and CONE BEAM CT ANGIOGRAPHY of the PELVIS MEDICATIONS: Rocephin 2 gm IV. The antibiotic was administered within one hour of the procedure ANESTHESIA/SEDATION: Moderate (conscious) sedation was employed during this procedure. A total of Versed 4.5 mg and Fentanyl 300 mcg was administered intravenously. Moderate Sedation Time: 154 minutes. The patient's level of consciousness and vital signs were monitored continuously by radiology nursing throughout the procedure under my direct supervision. CONTRAST:  59m OMNIPAQUE IOHEXOL 300 MG/ML SOLN, 215mOMNIPAQUE IOHEXOL 300 MG/ML SOLN, 2660mMNIPAQUE IOHEXOL 300 MG/ML SOLN, 4m64mNIPAQUE IOHEXOL 300 MG/ML SOLN FLUOROSCOPY TIME:  Fluoroscopic dose; 764 160 COMPLICATIONS: None immediate. PROCEDURE: Informed consent was obtained from the the patient and/or patient's representative following explanation of the procedure, risks, benefits and alternatives. The patient understands, agrees and consents for the procedure. All questions were addressed. A time out was performed prior to the initiation of the procedure. Maximal barrier sterile technique utilized including caps, mask,  sterile gowns, sterile gloves, large sterile drape, hand hygiene, and Betadine prep. The RIGHT femoral head was marked fluoroscopically. Under sterile conditions and local anesthesia, the RIGHT common femoral artery access was performed with a micropuncture needle. Under direct ultrasound guidance, the right common femoral was accessed with a micropuncture kit. An ultrasound image was saved for documentation purposes. This allowed for placement of a 5 Fr vascular sheath. A limited arteriogram was performed through the side arm of the  sheath confirming appropriate access within the RIGHT common femoral artery. The 5 Fr C2 catheter was utilized to select the contralateral LEFT internal iliac artery then cone beam CT angiogram was performed, with post processing performed at a separate workstation. Additionally, selective LEFT prostatic arteriogram was performed followed by catheterization with a 1.9 Fr 165 cm Progreat lambda microcatheter and 0.016 inch Fathom micro guide wire. Access was adequate for embolization. Embolization was performed with 100-300 um and 300-500 um embosphere micro particles followed by Gelfoam slurry. Active extravasation was noted from the LEFT prostate into the urinary bladder. Post embolization angiogram confirms stasis of the LEFT prostatic vascular territory. The 5 Fr catheter was retracted and using a Waltman loop was inserted into the ipsilateral/RIGHT internal iliac artery then cone beam CT angiogram was performed. Additionally selective RIGHT prostatic arteriogram was also performed followed by catheterization using microcatheter and microwire. A common origin of the RIGHT obturator artery and prostatic artery was present with a challenging posteromedial angle of origin. The common origin of the obturator and prostatic artery ended up with vasospasm and therefore we could not cannulate the RIGHT prostatic artery for safe embolization. The microcatheter and base catheter were retracted. Embolization was not performed on this side. CONE BEAM CT ANGIOGRAPHY: To utilize vascular tracking software, a cone beam CTA was then performed from the LEFT and RIGHT internal iliac arteries, and post processing was performed at a separate workstation. PURPOSE OF THE ARTERIOGRAMS: No previous catheter-directed angiogram was available. Therefore a new complete diagnostic angiography was performed. The decision to proceed with an interventional procedure was made based on this new diagnostic angiogram. ARTERIAL CLOSURE: At this  point, all wires, catheters and sheaths were removed from the patient. The sheath and catheter were removed and hemostasis was achieved by Angio-Seal closure of the RIGHT common femoral artery access. The puncture site was be dressed in a sterile manner. The patient tolerated the procedure well without immediate post procedural complication. FINDINGS: - access via the RIGHT common femoral artery. - Prostatic arteries arising from the anterior division, gluteo-pudendal trunk (Type II) on the LEFT and off the obturator artery on the RIGHT (Type III) - Active extravasation into urinary bladder on LEFT prostate artery arteriogram, consistent with hematuria - Successful microparticle prostate artery embolization (PAE) and Gelfoam slurry, with cross filling of the RIGHT prostatic gland. - Vasospasm of the shared origin of RIGHT obturator artery, unable to embolize via the RIGHT prostatic artery - AngioSeal closure at the RIGHT groin with distal RLE pulses at the end of the case. IMPRESSION: 1. Successful LEFT prostatic artery microparticle embolization for refractory hematuria via trans femoral approach. 2. 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 Embolization was not performed on this side. PLAN: Continuous bladder irrigation (CBI) per Urology and continue to monitor for hematuria with serial H/h. Michaelle Birks, MD [facility] Radiology Electronically Signed   By: Michaelle Birks M.D.   On: 01/18/2022 08:59   IR Angiogram Selective Each  Additional Vessel  Result Date: 01/18/2022 INDICATION: Briefly, 76 year old male comorbid with history of prostate cancer s/p brachytherapy and AFib previously on anticoagulation who presented with gross hematuria refractory to urologic intervention. EXAM: Procedures: 1. PROSTATE ARTERY EMBOLIZATION, LEFT 2. PELVIC ARTERIOGRAPHY and CONE BEAM CT ANGIOGRAPHY of the PELVIS MEDICATIONS: Rocephin 2  gm IV. The antibiotic was administered within one hour of the procedure ANESTHESIA/SEDATION: Moderate (conscious) sedation was employed during this procedure. A total of Versed 4.5 mg and Fentanyl 300 mcg was administered intravenously. Moderate Sedation Time: 154 minutes. The patient's level of consciousness and vital signs were monitored continuously by radiology nursing throughout the procedure under my direct supervision. CONTRAST:  74m OMNIPAQUE IOHEXOL 300 MG/ML SOLN, 282mOMNIPAQUE IOHEXOL 300 MG/ML SOLN, 2624mMNIPAQUE IOHEXOL 300 MG/ML SOLN, 21m8mNIPAQUE IOHEXOL 300 MG/ML SOLN FLUOROSCOPY TIME:  Fluoroscopic dose; 764 010 COMPLICATIONS: None immediate. PROCEDURE: Informed consent was obtained from the the patient and/or patient's representative following explanation of the procedure, risks, benefits and alternatives. The patient understands, agrees and consents for the procedure. All questions were addressed. A time out was performed prior to the initiation of the procedure. Maximal barrier sterile technique utilized including caps, mask, sterile gowns, sterile gloves, large sterile drape, hand hygiene, and Betadine prep. The RIGHT femoral head was marked fluoroscopically. Under sterile conditions and local anesthesia, the RIGHT common femoral artery access was performed with a micropuncture needle. Under direct ultrasound guidance, the right common femoral was accessed with a micropuncture kit. An ultrasound image was saved for documentation purposes. This allowed for placement of a 5 Fr vascular sheath. A limited arteriogram was performed through the side arm of the sheath confirming appropriate access within the RIGHT common femoral artery. The 5 Fr C2 catheter was utilized to select the contralateral LEFT internal iliac artery then cone beam CT angiogram was performed, with post processing performed at a separate workstation. Additionally, selective LEFT prostatic arteriogram was performed followed  by catheterization with a 1.9 Fr 165 cm Progreat lambda microcatheter and 0.016 inch Fathom micro guide wire. Access was adequate for embolization. Embolization was performed with 100-300 um and 300-500 um embosphere micro particles followed by Gelfoam slurry. Active extravasation was noted from the LEFT prostate into the urinary bladder. Post embolization angiogram confirms stasis of the LEFT prostatic vascular territory. The 5 Fr catheter was retracted and using a Waltman loop was inserted into the ipsilateral/RIGHT internal iliac artery then cone beam CT angiogram was performed. Additionally selective RIGHT prostatic arteriogram was also performed followed by catheterization using microcatheter and microwire. A common origin of the RIGHT obturator artery and prostatic artery was present with a challenging posteromedial angle of origin. The common origin of the obturator and prostatic artery ended up with vasospasm and therefore we could not cannulate the RIGHT prostatic artery for safe embolization. The microcatheter and base catheter were retracted. Embolization was not performed on this side. CONE BEAM CT ANGIOGRAPHY: To utilize vascular tracking software, a cone beam CTA was then performed from the LEFT and RIGHT internal iliac arteries, and post processing was performed at a separate workstation. PURPOSE OF THE ARTERIOGRAMS: No previous catheter-directed angiogram was available. Therefore a new complete diagnostic angiography was performed. The decision to proceed with an interventional procedure was made based on this new diagnostic angiogram. ARTERIAL CLOSURE: At this point, all wires, catheters and sheaths were removed from the patient. The sheath and catheter were removed and hemostasis was achieved by Angio-Seal closure of the RIGHT common femoral artery access. The puncture  site was be dressed in a sterile manner. The patient tolerated the procedure well without immediate post procedural complication.  FINDINGS: - access via the RIGHT common femoral artery. - Prostatic arteries arising from the anterior division, gluteo-pudendal trunk (Type II) on the LEFT and off the obturator artery on the RIGHT (Type III) - Active extravasation into urinary bladder on LEFT prostate artery arteriogram, consistent with hematuria - Successful microparticle prostate artery embolization (PAE) and Gelfoam slurry, with cross filling of the RIGHT prostatic gland. - Vasospasm of the shared origin of RIGHT obturator artery, unable to embolize via the RIGHT prostatic artery - AngioSeal closure at the RIGHT groin with distal RLE pulses at the end of the case. IMPRESSION: 1. Successful LEFT prostatic artery microparticle embolization for refractory hematuria via trans femoral approach. 2. 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 Embolization was not performed on this side. PLAN: Continuous bladder irrigation (CBI) per Urology and continue to monitor for hematuria with serial H/h. Michaelle Birks, MD [facility] Radiology Electronically Signed   By: Michaelle Birks M.D.   On: 01/18/2022 08:59   IR Angiogram Selective Each Additional Vessel  Result Date: 01/18/2022 INDICATION: Briefly, 76 year old male comorbid with history of prostate cancer s/p brachytherapy and AFib previously on anticoagulation who presented with gross hematuria refractory to urologic intervention. EXAM: Procedures: 1. PROSTATE ARTERY EMBOLIZATION, LEFT 2. PELVIC ARTERIOGRAPHY and CONE BEAM CT ANGIOGRAPHY of the PELVIS MEDICATIONS: Rocephin 2 gm IV. The antibiotic was administered within one hour of the procedure ANESTHESIA/SEDATION: Moderate (conscious) sedation was employed during this procedure. A total of Versed 4.5 mg and Fentanyl 300 mcg was administered intravenously. Moderate Sedation Time: 154 minutes. The patient's level of consciousness and vital signs were monitored  continuously by radiology nursing throughout the procedure under my direct supervision. CONTRAST:  35m OMNIPAQUE IOHEXOL 300 MG/ML SOLN, 272mOMNIPAQUE IOHEXOL 300 MG/ML SOLN, 2687mMNIPAQUE IOHEXOL 300 MG/ML SOLN, 30m2mNIPAQUE IOHEXOL 300 MG/ML SOLN FLUOROSCOPY TIME:  Fluoroscopic dose; 764 440 COMPLICATIONS: None immediate. PROCEDURE: Informed consent was obtained from the the patient and/or patient's representative following explanation of the procedure, risks, benefits and alternatives. The patient understands, agrees and consents for the procedure. All questions were addressed. A time out was performed prior to the initiation of the procedure. Maximal barrier sterile technique utilized including caps, mask, sterile gowns, sterile gloves, large sterile drape, hand hygiene, and Betadine prep. The RIGHT femoral head was marked fluoroscopically. Under sterile conditions and local anesthesia, the RIGHT common femoral artery access was performed with a micropuncture needle. Under direct ultrasound guidance, the right common femoral was accessed with a micropuncture kit. An ultrasound image was saved for documentation purposes. This allowed for placement of a 5 Fr vascular sheath. A limited arteriogram was performed through the side arm of the sheath confirming appropriate access within the RIGHT common femoral artery. The 5 Fr C2 catheter was utilized to select the contralateral LEFT internal iliac artery then cone beam CT angiogram was performed, with post processing performed at a separate workstation. Additionally, selective LEFT prostatic arteriogram was performed followed by catheterization with a 1.9 Fr 165 cm Progreat lambda microcatheter and 0.016 inch Fathom micro guide wire. Access was adequate for embolization. Embolization was performed with 100-300 um and 300-500 um embosphere micro particles followed by Gelfoam slurry. Active extravasation was noted from the LEFT prostate into the urinary bladder.  Post embolization angiogram confirms stasis of the LEFT prostatic vascular territory. The  5 Fr catheter was retracted and using a Waltman loop was inserted into the ipsilateral/RIGHT internal iliac artery then cone beam CT angiogram was performed. Additionally selective RIGHT prostatic arteriogram was also performed followed by catheterization using microcatheter and microwire. A common origin of the RIGHT obturator artery and prostatic artery was present with a challenging posteromedial angle of origin. The common origin of the obturator and prostatic artery ended up with vasospasm and therefore we could not cannulate the RIGHT prostatic artery for safe embolization. The microcatheter and base catheter were retracted. Embolization was not performed on this side. CONE BEAM CT ANGIOGRAPHY: To utilize vascular tracking software, a cone beam CTA was then performed from the LEFT and RIGHT internal iliac arteries, and post processing was performed at a separate workstation. PURPOSE OF THE ARTERIOGRAMS: No previous catheter-directed angiogram was available. Therefore a new complete diagnostic angiography was performed. The decision to proceed with an interventional procedure was made based on this new diagnostic angiogram. ARTERIAL CLOSURE: At this point, all wires, catheters and sheaths were removed from the patient. The sheath and catheter were removed and hemostasis was achieved by Angio-Seal closure of the RIGHT common femoral artery access. The puncture site was be dressed in a sterile manner. The patient tolerated the procedure well without immediate post procedural complication. FINDINGS: - access via the RIGHT common femoral artery. - Prostatic arteries arising from the anterior division, gluteo-pudendal trunk (Type II) on the LEFT and off the obturator artery on the RIGHT (Type III) - Active extravasation into urinary bladder on LEFT prostate artery arteriogram, consistent with hematuria - Successful  microparticle prostate artery embolization (PAE) and Gelfoam slurry, with cross filling of the RIGHT prostatic gland. - Vasospasm of the shared origin of RIGHT obturator artery, unable to embolize via the RIGHT prostatic artery - AngioSeal closure at the RIGHT groin with distal RLE pulses at the end of the case. IMPRESSION: 1. Successful LEFT prostatic artery microparticle embolization for refractory hematuria via trans femoral approach. 2. 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 Embolization was not performed on this side. PLAN: Continuous bladder irrigation (CBI) per Urology and continue to monitor for hematuria with serial H/h. Michaelle Birks, MD [facility] Radiology Electronically Signed   By: Michaelle Birks M.D.   On: 01/18/2022 08:59   IR EMBO ART  VEN HEMORR LYMPH EXTRAV  INC GUIDE ROADMAPPING  Result Date: 01/18/2022 INDICATION: Briefly, 76 year old male comorbid with history of prostate cancer s/p brachytherapy and AFib previously on anticoagulation who presented with gross hematuria refractory to urologic intervention. EXAM: Procedures: 1. PROSTATE ARTERY EMBOLIZATION, LEFT 2. PELVIC ARTERIOGRAPHY and CONE BEAM CT ANGIOGRAPHY of the PELVIS MEDICATIONS: Rocephin 2 gm IV. The antibiotic was administered within one hour of the procedure ANESTHESIA/SEDATION: Moderate (conscious) sedation was employed during this procedure. A total of Versed 4.5 mg and Fentanyl 300 mcg was administered intravenously. Moderate Sedation Time: 154 minutes. The patient's level of consciousness and vital signs were monitored continuously by radiology nursing throughout the procedure under my direct supervision. CONTRAST:  38m OMNIPAQUE IOHEXOL 300 MG/ML SOLN, 273mOMNIPAQUE IOHEXOL 300 MG/ML SOLN, 2667mMNIPAQUE IOHEXOL 300 MG/ML SOLN, 55m62mNIPAQUE IOHEXOL 300 MG/ML SOLN FLUOROSCOPY TIME:  Fluoroscopic dose; 764 947 COMPLICATIONS:  None immediate. PROCEDURE: Informed consent was obtained from the the patient and/or patient's representative following explanation of the procedure, risks, benefits and alternatives. The patient understands, agrees and consents for the procedure. All questions were addressed. A time  out was performed prior to the initiation of the procedure. Maximal barrier sterile technique utilized including caps, mask, sterile gowns, sterile gloves, large sterile drape, hand hygiene, and Betadine prep. The RIGHT femoral head was marked fluoroscopically. Under sterile conditions and local anesthesia, the RIGHT common femoral artery access was performed with a micropuncture needle. Under direct ultrasound guidance, the right common femoral was accessed with a micropuncture kit. An ultrasound image was saved for documentation purposes. This allowed for placement of a 5 Fr vascular sheath. A limited arteriogram was performed through the side arm of the sheath confirming appropriate access within the RIGHT common femoral artery. The 5 Fr C2 catheter was utilized to select the contralateral LEFT internal iliac artery then cone beam CT angiogram was performed, with post processing performed at a separate workstation. Additionally, selective LEFT prostatic arteriogram was performed followed by catheterization with a 1.9 Fr 165 cm Progreat lambda microcatheter and 0.016 inch Fathom micro guide wire. Access was adequate for embolization. Embolization was performed with 100-300 um and 300-500 um embosphere micro particles followed by Gelfoam slurry. Active extravasation was noted from the LEFT prostate into the urinary bladder. Post embolization angiogram confirms stasis of the LEFT prostatic vascular territory. The 5 Fr catheter was retracted and using a Waltman loop was inserted into the ipsilateral/RIGHT internal iliac artery then cone beam CT angiogram was performed. Additionally selective RIGHT prostatic arteriogram was also performed  followed by catheterization using microcatheter and microwire. A common origin of the RIGHT obturator artery and prostatic artery was present with a challenging posteromedial angle of origin. The common origin of the obturator and prostatic artery ended up with vasospasm and therefore we could not cannulate the RIGHT prostatic artery for safe embolization. The microcatheter and base catheter were retracted. Embolization was not performed on this side. CONE BEAM CT ANGIOGRAPHY: To utilize vascular tracking software, a cone beam CTA was then performed from the LEFT and RIGHT internal iliac arteries, and post processing was performed at a separate workstation. PURPOSE OF THE ARTERIOGRAMS: No previous catheter-directed angiogram was available. Therefore a new complete diagnostic angiography was performed. The decision to proceed with an interventional procedure was made based on this new diagnostic angiogram. ARTERIAL CLOSURE: At this point, all wires, catheters and sheaths were removed from the patient. The sheath and catheter were removed and hemostasis was achieved by Angio-Seal closure of the RIGHT common femoral artery access. The puncture site was be dressed in a sterile manner. The patient tolerated the procedure well without immediate post procedural complication. FINDINGS: - access via the RIGHT common femoral artery. - Prostatic arteries arising from the anterior division, gluteo-pudendal trunk (Type II) on the LEFT and off the obturator artery on the RIGHT (Type III) - Active extravasation into urinary bladder on LEFT prostate artery arteriogram, consistent with hematuria - Successful microparticle prostate artery embolization (PAE) and Gelfoam slurry, with cross filling of the RIGHT prostatic gland. - Vasospasm of the shared origin of RIGHT obturator artery, unable to embolize via the RIGHT prostatic artery - AngioSeal closure at the RIGHT groin with distal RLE pulses at the end of the case. IMPRESSION: 1.  Successful LEFT prostatic artery microparticle embolization for refractory hematuria via trans femoral approach. 2. 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 Embolization was not performed on this side. PLAN: Continuous bladder irrigation (CBI) per Urology and continue to monitor for hematuria with serial H/h. Michaelle Birks, MD [facility] Inter[facility] Radiology Special[facility] Radiology Electronically Signed  By: Michaelle Birks M.D.   On: 01/18/2022 08:59   CT Angio Abd/Pel w/ and/or w/o  Result Date: 01/14/2022 CLINICAL DATA:  Chronic kidney disease, prostate embolization mapping evaluation. EXAM: CTA ABDOMEN AND PELVIS WITHOUT AND WITH CONTRAST TECHNIQUE: Multidetector CT imaging of the abdomen and pelvis was performed using the standard protocol during bolus administration of intravenous contrast. Multiplanar reconstructed images and MIPs were obtained and reviewed to evaluate the vascular anatomy. RADIATION DOSE REDUCTION: This exam was performed according to the departmental dose-optimization program which includes automated exposure control, adjustment of the mA and/or kV according to patient size and/or use of iterative reconstruction technique. CONTRAST:  119m OMNIPAQUE IOHEXOL 350 MG/ML SOLN COMPARISON:  Prior CT scan of the abdomen and pelvis 05/27/2019 FINDINGS: VASCULAR Aorta: Normal caliber aorta without aneurysm, dissection, vasculitis or significant stenosis. Scattered atherosclerotic vascular calcifications. Celiac: Patent without evidence of aneurysm, dissection, vasculitis or significant stenosis. SMA: Patent without evidence of aneurysm, dissection, vasculitis or significant stenosis. Renals: 2 renal arteries are present bilaterally. On the right, the main renal artery demonstrates focal fibrofatty atherosclerotic plaque resulting in at least mild stenosis at the origin. There is a smaller accessory artery providing supply to the upper  pole. On the left, the 2 renal arteries are code dominant. Mild atherosclerotic plaque at the origins without significant appearing stenosis. No dissection, aneurysm or evidence of fibromuscular dysplasia. IMA: Patent without evidence of aneurysm, dissection, vasculitis or significant stenosis. Inflow: Atherosclerotic plaque at the origin of the right internal iliac artery without significant stenosis. However, there is more significant atherosclerotic vascular disease at the bifurcation into the anterior and posterior divisions with long segment irregular stenosis of the superior gluteal artery, and bulky combined calcified and fibrofatty atherosclerotic plaque resulting in significant stenosis at the origin of the anterior division. The right prostatic artery appears to arise proximally from right obturator artery. Common and external iliac arteries are relatively spared from disease. On the left, mild atherosclerotic plaque throughout the common and external iliac artery without significant stenosis. The origin into the internal iliac artery is patent. Similar to the contralateral side, mixed calcified and fibrofatty atherosclerotic plaque results in significant stenosis at the origin of the anterior division. The left prostatic artery appears to arise from the gluteal pudendal trunk. Of note, it is not well opacified for an intermediate length segment. The left obturator artery is replaced to the inferior epigastric artery (corona mortis). Proximal Outflow: Bilateral common femoral and visualized portions of the superficial and profunda femoral arteries are patent without evidence of aneurysm, dissection, vasculitis or significant stenosis. Veins: No focal venous abnormality.  IVC filter in place. Review of the MIP images confirms the above findings. NON-VASCULAR Lower chest: No acute abnormality. Atherosclerotic plaque present in the coronary arteries. Dependent atelectasis in both lower lobes. No suspicious  mass or pulmonary nodule. Hepatobiliary: No focal liver abnormality is seen. No gallstones, gallbladder wall thickening, or biliary dilatation. Pancreas: Unremarkable. No pancreatic ductal dilatation or surrounding inflammatory changes. Spleen: Normal in size without focal abnormality. Adrenals/Urinary Tract: 1.3 cm left adrenal nodule appears grossly unchanged. Multiple circumscribed low-attenuation renal lesions remain unchanged in size and appearance consistent with simple cysts. There is a small exophytic enhancing lesion arising from the anterior aspect of the upper pole of the right kidney which measures 1 x 1 cm. 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 Moderate left hydroureteronephrosis. Mild right hydroureteronephrosis. Prostatomegaly. Additionally, high  attenuation material is seen layering along the posterior wall of the bladder and likely partially obstructing the left ureteral orifice. A 1 cm bladder stone is also present dependently. Suprapubic catheter is in place. Brachytherapy seeds visualized within the prostate gland base. Stomach/Bowel: Stomach is within normal limits. Appendix appears normal. No evidence of bowel wall thickening, distention, or inflammatory changes. Colonic diverticular disease without CT evidence of active inflammation. Lymphatic: No suspicious lymphadenopathy. Reproductive: Marked prostatomegaly. Brachytherapy seeds visualized in the base of the prostate gland. Other: No abdominal wall hernia or abnormality. No abdominopelvic ascites. Musculoskeletal: No acute or significant osseous findings. Mild bilateral hip joint osteoarthritis. IMPRESSION: VASCULAR 1. Scattered atherosclerotic plaque affecting the origins and proximal aspects of the anterior divisions of the iliac arteries bilaterally. 2. On the right, the prostatic artery appears to arise proximally from the obturator artery. 3.  On the left, the prostatic artery appears to arise from the gluteal pudendal trunk. However, underlying atherosclerotic disease limits visualization in there may be significant stenosis of the gluteal prostatic trunk and proximal prostatic artery. 4. 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 5. 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 6.  Aortic Atherosclerosis (ICD10-I70.0). NON-VASCULAR 1. Small 1 cm enhancing lesion exophytic from the anterior upper pole of the right kidney demonstrates no significant interval change compared prior imaging from November of 2020. 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 Recommend continued attention on follow-up imaging. 2. Left greater than right bilateral hydroureteronephrosis likely due to obstruction at the ureteral orifices from either hematoma or bladder mass along the posterior bladder wall. 3. Irregular lobular high attenuation soft tissue layering posteriorly along the bladder wall may represent hematoma or a lobulated bladder wall mass. 4. 1 cm bladder stone. 5. 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 6. Colonic diverticular disease without CT evidence of active inflammation. 7. Additional ancillary findings as above. These results will be called to the ordering clinician or representative by the Radiologist Assistant, and communication documented in the PACS or [employer]. Signed, Criselda Peaches, MD, [facility] Electronically Signed   By: Jacqulynn Cadet M.D.   On: 01/14/2022 07:19      Subjective: Denies complaints. Eager to go home.  Discharge Exam:  Vitals:   01/27/22 1702 01/27/22 2033 01/28/22 0511 01/28/22 1147  BP: 140/81 114/75 (!) 130/95 116/71  Pulse: 71 68 72 75  Resp: '16 18 18 18  ' Temp: 98.1 F (36.7 C) 98.4 F (36.9 C) 98.1 F (36.7 C) 98.2 F (36.8 C)  TempSrc: Oral Oral  Oral Oral  SpO2: 100% 100% 100% 100%  Weight:      Height:        General: Elderly male, moderately built and frail lying comfortably supine in bed without distress. Cardiovascular: S1 and S2 heard, RRR.  No JVD, murmurs. Respiratory: Slightly diminished breath sounds in the bases.  Otherwise clear to auscultation.  No crackles, wheezing or rhonchi. Abdomen: Soft nontender nondistended with normal bowel sounds x4 quadrants.  Foley catheter was discontinued yesterday during cystoscopy.  SP catheter in place and drainage bag shows no hematuria. Musculoskeletal: Symmetric movement of all extremities. Skin: Not examined today. Psychiatry: Judgment and insight appear impaired.  Mood appropriate. Extremities: Right lower extremity edema at this mostly in the thigh and some in the leg.  No concern for compartment syndrome.  Compartments are soft.  Distal pulses well felt.  Left  leg with trace edema.      The results of significant diagnostics from this hospitalization (including imaging, microbiology, ancillary and laboratory) are listed below for reference.     Microbiology: Recent Results (from the past 240 hour(s))  Urine Culture     Status: Abnormal   Collection Time: 01/24/22  9:15 AM   Specimen: Urine, Catheterized  Result Value Ref Range Status   Specimen Description   Final    URINE, CATHETERIZED Performed at [facility] [address] [place], [place] [address]    Special Requests   Final    NONE Performed at [facility], [place] [address] [place], [place] [address]    Culture (A)  Final    <10,000 COLONIES/mL INSIGNIFICANT GROWTH Performed at [facility] [address], [place], [place] [address]    Report Status 01/25/2022 FINAL  Final  Culture, blood (Routine X 2) w Reflex to ID Panel     Status: None (Preliminary result)   Collection Time: 01/24/22  9:34 AM   Specimen: BLOOD  Result Value Ref Range Status   Specimen  Description   Final    BLOOD LEFT ANTECUBITAL Performed at [facility] [address] [place], [place] [address]    Special Requests   Final    BOTTLES DRAWN AEROBIC ONLY Blood Culture adequate volume Performed at [facility] [address] [place], [place] [address]    Culture   Final    NO GROWTH 4 DAYS Performed at [facility], [place] [address] [place], [place] [address]    Report Status PENDING  Incomplete  Culture, blood (Routine X 2) w Reflex to ID Panel     Status: None (Preliminary result)   Collection Time: 01/24/22  9:34 AM   Specimen: BLOOD LEFT HAND  Result Value Ref Range Status   Specimen Description   Final    BLOOD LEFT HAND Performed at [facility] [address], [place], [place] [address]    Special Requests   Final    BOTTLES DRAWN AEROBIC ONLY Blood Culture adequate volume Performed at [facility] [address] [place], [place] [address]    Culture   Final    NO GROWTH 4 DAYS Performed at [facility], [place] [address], [place], [place] [address]    Report Status PENDING  Incomplete     Labs: CBC: Recent Labs  Lab 01/22/22 0448 01/22/22 2324 01/24/22 0442 01/25/22 0516 01/26/22 0536 01/26/22 1226 01/27/22 0858 01/27/22 1858 01/28/22 0454  WBC 10.3   < > 9.7 9.2 8.6 9.1 9.9  --  9.3  NEUTROABS 7.9*  --  7.2  --   --   --   --   --   --   HGB 7.6*   < > 8.1* 7.5* 6.6* 7.6* 7.3* 8.7* 8.5*  HCT 24.5*   < > 25.6* 23.6* 21.3* 24.2* 23.2* 27.3* 26.3*  MCV 88.8   < > 87.7 88.1 87.7 86.7 88.5  --  86.5  PLT 208   < > 191 211 228 241 280  --  302   < > = values in this interval not displayed.    Basic Metabolic Panel: Recent Labs  Lab 01/22/22 0448 01/24/22 0442 01/25/22 0516 01/26/22 0536 01/27/22 0858 01/28/22 0454 01/28/22 0905  NA 136 139 137 136 138 136 137  K 4.9 4.4 4.6 4.7 4.9 5.3* 5.1  CL 105 105 105 106 108 102 104  CO2 '23 24 23 23  ' 21* 24 24  GLUCOSE 101* 111* 115* 99 102* 135* 126*  BUN 26* 53* 40* 33* 24* 21 22  CREATININE 1.29* 2.10* 1.51* 1.13 1.11 1.01 1.04  CALCIUM 8.7* 9.3 8.7* 8.3* 8.3* 8.5* 8.5*  MG 2.1 3.0*  --   --   --   --   --   PHOS  --  5.3*  --   --   --   --   --      CBG: Recent Labs  Lab 01/27/22 1638 01/27/22 1815 01/27/22 2032 01/28/22 0721 01/28/22 1146  GLUCAP 126* 133* 165* 132* 152*    Urinalysis    Component Value Date/Time   COLORURINE RED (A) 01/11/2022 1905   APPEARANCEUR TURBID (A) 01/11/2022 1905   LABSPEC  01/11/2022 1905    TEST NOT REPORTED DUE TO COLOR INTERFERENCE OF URINE PIGMENT   PHURINE  01/11/2022 1905    TEST NOT REPORTED DUE TO COLOR INTERFERENCE OF URINE PIGMENT   GLUCOSEU (A) 01/11/2022 1905    TEST NOT REPORTED DUE TO COLOR INTERFERENCE OF URINE PIGMENT   HGBUR (A) 01/11/2022 1905    TEST NOT REPORTED DUE TO COLOR INTERFERENCE OF URINE PIGMENT   BILIRUBINUR (A) 01/11/2022 1905    TEST NOT REPORTED DUE TO COLOR INTERFERENCE OF URINE PIGMENT   KETONESUR (A) 01/11/2022 1905    TEST NOT REPORTED DUE TO COLOR INTERFERENCE OF URINE PIGMENT   PROTEINUR (A) 01/11/2022 1905    TEST NOT REPORTED DUE TO COLOR INTERFERENCE OF URINE PIGMENT   NITRITE (A) 01/11/2022 1905    TEST NOT REPORTED DUE TO COLOR INTERFERENCE OF URINE PIGMENT   LEUKOCYTESUR (A) 01/11/2022 1905    TEST NOT REPORTED DUE TO COLOR INTERFERENCE OF URINE PIGMENT   I discussed in detail with patient's daughter via phone, updated care and answered all questions.   Time coordinating discharge: 45 minutes  SIGNED:  Vernell Leep, MD,  FACP, [facility], [facility], [facility] (Care Management Physician Certified). [facility]  To contact the attending provider between 7A-7P or the covering provider during after hours 7P-7A, please log into the web site [email or web address] and access using universal [place] password for that web site. If you do not have the password, please call the  hospital operator.

## 2022-01-28 NOTE — Plan of Care (Signed)
PIV removed. DC paperwork reviewed.   Problem: Health Behavior/Discharge Planning: Goal: Ability to manage health-related needs will improve Outcome: Completed/Met   Problem: Clinical Measurements: Goal: Ability to maintain clinical measurements within normal limits will improve Outcome: Completed/Met Goal: Will remain free from infection Outcome: Completed/Met Goal: Diagnostic test results will improve Outcome: Completed/Met Goal: Respiratory complications will improve Outcome: Completed/Met Goal: Cardiovascular complication will be avoided Outcome: Completed/Met   Problem: Activity: Goal: Risk for activity intolerance will decrease Outcome: Completed/Met   Problem: Nutrition: Goal: Adequate nutrition will be maintained Outcome: Completed/Met   Problem: Coping: Goal: Level of anxiety will decrease Outcome: Completed/Met   Problem: Elimination: Goal: Will not experience complications related to bowel motility Outcome: Completed/Met Goal: Will not experience complications related to urinary retention Outcome: Completed/Met   Problem: Safety: Goal: Ability to remain free from injury will improve Outcome: Completed/Met   Problem: Skin Integrity: Goal: Risk for impaired skin integrity will decrease Outcome: Completed/Met   Problem: Education: Goal: Ability to describe self-care measures that may prevent or decrease complications (Diabetes Survival Skills Education) will improve Outcome: Completed/Met Goal: Individualized Educational Video(s) Outcome: Completed/Met   Problem: Coping: Goal: Ability to adjust to condition or change in health will improve Outcome: Completed/Met   Problem: Fluid Volume: Goal: Ability to maintain a balanced intake and output will improve Outcome: Completed/Met   Problem: Health Behavior/Discharge Planning: Goal: Ability to identify and utilize available resources and services will improve Outcome: Completed/Met Goal: Ability to  manage health-related needs will improve Outcome: Completed/Met   Problem: Metabolic: Goal: Ability to maintain appropriate glucose levels will improve Outcome: Completed/Met   Problem: Nutritional: Goal: Maintenance of adequate nutrition will improve Outcome: Completed/Met Goal: Progress toward achieving an optimal weight will improve Outcome: Completed/Met   Problem: Skin Integrity: Goal: Risk for impaired skin integrity will decrease Outcome: Completed/Met   Problem: Tissue Perfusion: Goal: Adequacy of tissue perfusion will improve Outcome: Completed/Met   Problem: Education: Goal: Understanding of CV disease, CV risk reduction, and recovery process will improve Outcome: Completed/Met Goal: Individualized Educational Video(s) Outcome: Completed/Met   Problem: Activity: Goal: Ability to return to baseline activity level will improve Outcome: Completed/Met   Problem: Cardiovascular: Goal: Ability to achieve and maintain adequate cardiovascular perfusion will improve Outcome: Completed/Met Goal: Vascular access site(s) Level 0-1 will be maintained Outcome: Completed/Met   Problem: Health Behavior/Discharge Planning: Goal: Ability to safely manage health-related needs after discharge will improve Outcome: Completed/Met

## 2022-01-28 NOTE — TOC Progression Note (Addendum)
Transition of Care [facility]) - Progression Note    Patient Details  Name: Nathan [name] MRN: [id number] Date of Birth: April 28, 1946  Transition of [facility]) CM/SW Contact  Harol Shabazz, Juliann Pulse, RN Phone Number: 01/28/2022, 11:14 AM  Clinical Narrative: Active [facility] -HHRN-suprapubic cath,& HHPT-noted no PT f/u needed in hospital appropriate for d/c will need [facility] orders. Family has own transport home.      Expected Discharge Plan: [place] Barriers to Discharge: Continued Medical Work up  Expected Discharge Plan and Services Expected Discharge Plan: [place]   Discharge Planning Services: CM Consult Post Acute Care Choice: [place] arrangements for the past 2 months: Single Family Home                                       Social Determinants of Health (SDOH) Interventions    Readmission Risk Interventions    12/11/2021    2:36 PM  Readmission Risk Prevention Plan  Post Dischage Appt Complete  Medication Screening Complete  Transportation Screening Complete

## 2022-01-28 NOTE — Progress Notes (Addendum)
Patient ID: Nathan [name], male   DOB: Apr 07, 1946, 76 y.o.   MRN: [id number]  1 Day Post-Op Subjective: Patient status post cystoscopy, clot VAC, fulguration and Foley catheter removal. Received 1 unit of packed red blood cells This morning the patient's suprapubic tube was draining clear urine. Patient without complaint today  Objective: Vital signs in last 24 hours: Temp:  [97.5 F (36.4 C)-98.4 F (36.9 C)] 98.1 F (36.7 C) (07/30 0511) Pulse Rate:  [66-74] 72 (07/30 0511) Resp:  [12-18] 18 (07/30 0511) BP: (108-140)/(68-95) 130/95 (07/30 0511) SpO2:  [99 %-100 %] 100 % (07/30 0511)  Intake/Output from previous day: 07/29 0701 - 07/30 0700 In: 958 [I.V.:400; Blood:358; IV Piggyback:200] Out: 1700 [Urine:1700] Intake/Output this shift: No intake/output data recorded.  Physical Exam:  General: Alert and oriented GU: Suprapubic tube is draining straw-colored urine.  Lab Results: Recent Labs    01/27/22 0858 01/27/22 1858 01/28/22 0454  HGB 7.3* 8.7* 8.5*  HCT 23.2* 27.3* 26.3*    BMET Recent Labs    01/28/22 0454 01/28/22 0905  NA 136 137  K 5.3* 5.1  CL 102 104  CO2 24 24  GLUCOSE 135* 126*  BUN 21 22  CREATININE 1.01 1.04  CALCIUM 8.5* 8.5*      Studies/Results: No results found.  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   The patient's hematuria appears to have stopped, his hemoglobin is stable.  From a urologic perspective I recommended that he be discharged home with close follow-up. Needs his SP tube exchanged in approximately 3 weeks.   LOS: 16 days   Ardis Hughs 01/28/2022, 9:59 AM

## 2022-01-28 NOTE — TOC Transition Note (Signed)
Transition of Care [facility]) - CM/SW Discharge Note   Patient Details  Name: Nathan [name] MRN: [id number] Date of Birth: January 09, 1946  Transition of Care [facility]) CM/SW Contact:  Dessa Phi, RN Phone Number: 01/28/2022, 4:42 PM   Clinical Narrative:  d/c today-[facility] rep Claiborne Billings aware of HHRN/HHPT resumed. No further CM needs.     Final next level of care: [place] Barriers to Discharge: No Barriers Identified   Patient Goals and CMS Choice Patient states their goals for this hospitalization and ongoing recovery are:: Home CMS Medicare.gov Compare Post Acute Care list provided to:: Patient Represenative (must comment) ([place] dtr) Choice offered to / list presented to : Adult Children  Discharge Placement                       Discharge Plan and Services   Discharge Planning Services: CM Consult Post Acute Care Choice: Home Health                               Social Determinants of Health (SDOH) Interventions     Readmission Risk Interventions    12/11/2021    2:36 PM  Readmission Risk Prevention Plan  Post Dischage Appt Complete  Medication Screening Complete  Transportation Screening Complete

## 2022-01-28 NOTE — Discharge Instructions (Signed)

## 2022-01-28 NOTE — Progress Notes (Signed)
Lower extremity venous bilateral and IVC/iliac study completed.  Preliminary results relayed to [facility], MD via phone.   See CV Proc for preliminary results report.   Darlin Coco, RDMS, RVT

## 2022-01-29 ENCOUNTER — Other Ambulatory Visit: Payer: Self-pay | Admitting: *Deleted

## 2022-01-29 LAB — TYPE AND SCREEN
ABO/RH(D): B POS
ABO/RH(D): B POS
Antibody Screen: NEGATIVE
Antibody Screen: NEGATIVE
Unit division: 0
Unit division: 0
Unit division: 0

## 2022-01-29 LAB — CULTURE, BLOOD (ROUTINE X 2)
Culture: NO GROWTH
Culture: NO GROWTH
Special Requests: ADEQUATE
Special Requests: ADEQUATE

## 2022-01-29 LAB — BPAM RBC
Blood Product Expiration Date: 202308112359
Blood Product Expiration Date: 202308132359
Blood Product Expiration Date: 202308182359
ISSUE DATE / TIME: 202307250323
ISSUE DATE / TIME: 202307280827
ISSUE DATE / TIME: 202307291355
Unit Type and Rh: 7300
Unit Type and Rh: 7300
Unit Type and Rh: 7300

## 2022-01-29 NOTE — Patient Outreach (Signed)
  Care Coordination [facility] Note Transition Care Management Follow-up Telephone Call Date of discharge and from where: [phone] [facility] How have you been since you were released from the hospital? [place]. His bag busted this morning and had to be cleaned up,  Any questions or concerns? No  Items Reviewed: Did the pt receive and understand the discharge instructions provided? Yes  and Family Medications obtained and verified? Yes  Other? No  Any new allergies since your discharge? No  Dietary orders reviewed? No Do you have support at home? Yes   Home Care and Equipment/Supplies: Were home health services ordered? yes If so, what is the name of the agency? [facility]  Has the agency set up a time to come to the patient's home? No daughter is calling now Were any new equipment or medical supplies ordered?  No What is the name of the medical supply agency? N/a Were you able to get the supplies/equipment? no Do you have any questions related to the use of the equipment or supplies? No  Functional Questionnaire: (I = Independent and D = Dependent) ADLs: D  Bathing/Dressing- D  Meal Prep- D  Eating- I  Maintaining continence- D  Transferring/Ambulation-D  Managing Meds- D  Follow up appointments reviewed:  PCP Hospital f/u appt confirmed? Y Scheduled to see Dr Horald Pollen [phone] 2 pm  [facility] f/u appt confirmed? No   Are transportation arrangements needed? No  If their condition worsens, is the pt aware to call PCP or go to the Emergency Dept.? Yes Was the patient provided with contact information for the PCP's office or ED? Yes Was to pt encouraged to call back with questions or concerns? Yes  SDOH assessments and interventions completed:   Yes  Care Coordination Interventions Activated:  No Care Coordination Interventions:   N/a  Encounter Outcome:  Pt. Visit Completed

## 2022-02-12 ENCOUNTER — Encounter (HOSPITAL_COMMUNITY): Payer: Self-pay

## 2022-02-12 ENCOUNTER — Other Ambulatory Visit: Payer: Self-pay

## 2022-02-12 ENCOUNTER — Inpatient Hospital Stay (HOSPITAL_COMMUNITY)
Admission: EM | Admit: 2022-02-12 | Discharge: 2022-02-16 | DRG: 663 | Disposition: A | Discharge status: Home Health | Payer: Medicare Other | Attending: Family Medicine | Admitting: Family Medicine

## 2022-02-12 DIAGNOSIS — Z66 Do not resuscitate: Secondary | ICD-10-CM | POA: Diagnosis present

## 2022-02-12 DIAGNOSIS — Y842 Radiological procedure and radiotherapy as the cause of abnormal reaction of the patient, or of later complication, without mention of misadventure at the time of the procedure: Secondary | ICD-10-CM | POA: Diagnosis present

## 2022-02-12 DIAGNOSIS — Z923 Personal history of irradiation: Secondary | ICD-10-CM | POA: Diagnosis not present

## 2022-02-12 DIAGNOSIS — Z20822 Contact with and (suspected) exposure to covid-19: Secondary | ICD-10-CM | POA: Diagnosis present

## 2022-02-12 DIAGNOSIS — D62 Acute posthemorrhagic anemia: Secondary | ICD-10-CM | POA: Diagnosis present

## 2022-02-12 DIAGNOSIS — Z960 Presence of urogenital implants: Secondary | ICD-10-CM | POA: Diagnosis present

## 2022-02-12 DIAGNOSIS — Z87891 Personal history of nicotine dependence: Secondary | ICD-10-CM | POA: Diagnosis not present

## 2022-02-12 DIAGNOSIS — I456 Pre-excitation syndrome: Secondary | ICD-10-CM | POA: Diagnosis present

## 2022-02-12 DIAGNOSIS — C61 Malignant neoplasm of prostate: Secondary | ICD-10-CM | POA: Diagnosis present

## 2022-02-12 DIAGNOSIS — I482 Chronic atrial fibrillation, unspecified: Secondary | ICD-10-CM | POA: Diagnosis present

## 2022-02-12 DIAGNOSIS — I10 Essential (primary) hypertension: Secondary | ICD-10-CM | POA: Diagnosis present

## 2022-02-12 DIAGNOSIS — R310 Gross hematuria: Principal | ICD-10-CM | POA: Diagnosis present

## 2022-02-12 DIAGNOSIS — E119 Type 2 diabetes mellitus without complications: Secondary | ICD-10-CM | POA: Diagnosis present

## 2022-02-12 DIAGNOSIS — Z95828 Presence of other vascular implants and grafts: Secondary | ICD-10-CM

## 2022-02-12 DIAGNOSIS — Z79899 Other long term (current) drug therapy: Secondary | ICD-10-CM | POA: Diagnosis not present

## 2022-02-12 DIAGNOSIS — R509 Fever, unspecified: Secondary | ICD-10-CM | POA: Diagnosis not present

## 2022-02-12 DIAGNOSIS — Z8049 Family history of malignant neoplasm of other genital organs: Secondary | ICD-10-CM

## 2022-02-12 DIAGNOSIS — Z86718 Personal history of other venous thrombosis and embolism: Secondary | ICD-10-CM

## 2022-02-12 DIAGNOSIS — Z8546 Personal history of malignant neoplasm of prostate: Secondary | ICD-10-CM | POA: Diagnosis not present

## 2022-02-12 DIAGNOSIS — Z7901 Long term (current) use of anticoagulants: Secondary | ICD-10-CM

## 2022-02-12 DIAGNOSIS — I480 Paroxysmal atrial fibrillation: Secondary | ICD-10-CM | POA: Diagnosis present

## 2022-02-12 DIAGNOSIS — Z801 Family history of malignant neoplasm of trachea, bronchus and lung: Secondary | ICD-10-CM

## 2022-02-12 DIAGNOSIS — R319 Hematuria, unspecified: Secondary | ICD-10-CM | POA: Diagnosis present

## 2022-02-12 LAB — CBC WITH DIFFERENTIAL/PLATELET
Abs Immature Granulocytes: 0.02 10*3/uL (ref 0.00–0.07)
Basophils Absolute: 0 10*3/uL (ref 0.0–0.1)
Basophils Relative: 0 %
Eosinophils Absolute: 0.3 10*3/uL (ref 0.0–0.5)
Eosinophils Relative: 3 %
HCT: 28.1 % — ABNORMAL LOW (ref 39.0–52.0)
Hemoglobin: 8.8 g/dL — ABNORMAL LOW (ref 13.0–17.0)
Immature Granulocytes: 0 %
Lymphocytes Relative: 10 %
Lymphs Abs: 0.9 10*3/uL (ref 0.7–4.0)
MCH: 27.9 pg (ref 26.0–34.0)
MCHC: 31.3 g/dL (ref 30.0–36.0)
MCV: 89.2 fL (ref 80.0–100.0)
Monocytes Absolute: 1 10*3/uL (ref 0.1–1.0)
Monocytes Relative: 12 %
Neutro Abs: 6.6 10*3/uL (ref 1.7–7.7)
Neutrophils Relative %: 75 %
Platelets: 239 10*3/uL (ref 150–400)
RBC: 3.15 MIL/uL — ABNORMAL LOW (ref 4.22–5.81)
RDW: 18.1 % — ABNORMAL HIGH (ref 11.5–15.5)
WBC: 8.8 10*3/uL (ref 4.0–10.5)
nRBC: 0 % (ref 0.0–0.2)

## 2022-02-12 LAB — URINALYSIS, ROUTINE W REFLEX MICROSCOPIC: Bacteria, UA: NONE SEEN

## 2022-02-12 LAB — COMPREHENSIVE METABOLIC PANEL
ALT: 12 U/L (ref 0–44)
AST: 21 U/L (ref 15–41)
Albumin: 2.9 g/dL — ABNORMAL LOW (ref 3.5–5.0)
Alkaline Phosphatase: 54 U/L (ref 38–126)
Anion gap: 8 (ref 5–15)
BUN: 14 mg/dL (ref 8–23)
CO2: 23 mmol/L (ref 22–32)
Calcium: 8.4 mg/dL — ABNORMAL LOW (ref 8.9–10.3)
Chloride: 108 mmol/L (ref 98–111)
Creatinine, Ser: 1.03 mg/dL (ref 0.61–1.24)
GFR, Estimated: >60 mL/min (ref >60)
Glucose, Bld: 110 mg/dL — ABNORMAL HIGH (ref 70–99)
Potassium: 3.8 mmol/L (ref 3.5–5.1)
Sodium: 139 mmol/L (ref 135–145)
Total Bilirubin: 0.3 mg/dL (ref 0.3–1.2)
Total Protein: 6.2 g/dL — ABNORMAL LOW (ref 6.5–8.1)

## 2022-02-12 MED ORDER — SODIUM CHLORIDE 0.9 % IV SOLN
1.0000 g | Freq: Once | INTRAVENOUS | Status: AC
  Administered 2022-02-12: 1 g via INTRAVENOUS
  Filled 2022-02-12: qty 10

## 2022-02-12 NOTE — Consult Note (Signed)
Urology Consult   Reason for consult: hematuria  History of Present Illness: Nathan [name] is a 76 y.o. with a complex GU history who presents to the ED tonight with hematuria.    He was seen in the urology clinic earlier today for the same complaint.  At that time moderate amount of clots were irrigated out, his suprapubic tube was exchanged, and a 22 [place] three-way catheter was placed.  After going home his daughter reports his urine seem to darken up; she became concerned and brought him to the emergency department.  He has no acute complaints at this time.  Vital signs notable for temperature 100.5.  Otherwise blood counts are stable  Past Medical History:  Diagnosis Date   Anginal pain ([place])    Atrial fibrillation ([place]) 01/2020   resolved now    Cancer of prostate ([place]) 2020   Chronic anticoagulation    Diabetes mellitus without complication ([place])    type 2 diet controlled   Dysrhythmia    a fib   Foley catheter in place    will be changed 05-16-20   HTN (hypertension)    Lipoma of chest wall    Pneumonia 01/2020   and uti in hospital for 1 week   WPW (Wolff-Parkinson-White syndrome)    No prior ablation    Past Surgical History:  Procedure Laterality Date   CATARACT EXTRACTION Bilateral 2020   CYSTOSCOPY N/A 06/05/2021   Procedure: CYSTOSCOPY WITH SUPRA PUBIC TUBE PLACEMENT;  Surgeon: Raynelle Bring, MD;  Location: [facility];  Service: Urology;  Laterality: N/A;   CYSTOSCOPY WITH FULGERATION N/A 01/27/2022   Procedure: [place] WITH CLOT EVACUATION;  Surgeon: Ardis Hughs, MD;  Location: [facility];  Service: Urology;  Laterality: N/A;   IR 3D INDEPENDENT WKST  01/17/2022   IR ANGIOGRAM PELVIS SELECTIVE OR SUPRASELECTIVE  01/17/2022   IR ANGIOGRAM SELECTIVE EACH ADDITIONAL VESSEL  01/17/2022   IR ANGIOGRAM SELECTIVE EACH ADDITIONAL VESSEL  01/17/2022   IR EMBO ART  VEN HEMORR LYMPH EXTRAV  INC GUIDE ROADMAPPING  01/17/2022   IR US GUIDE VASC ACCESS  RIGHT  01/17/2022   LIPOMA EXCISION Left 05/16/2020   Procedure: EXCISION LIPOMA LEFT CHEST WALL;  Surgeon: Kieth Brightly Arta Bruce, MD;  Location: [facility];  Service: General;  Laterality: Left;   RADIOACTIVE SEED IMPLANT  2020   TRANSURETHRAL RESECTION OF BLADDER TUMOR N/A 01/17/2022   Procedure: CYSTOSCOPY WITH CLOT EVACUATION, FULGERATION OF PROSTATE;  Surgeon: Festus Aloe, MD;  Location: [facility];  Service: Urology;  Laterality: N/A;   VENA CAVA FILTER PLACEMENT Right 12/10/2021   Procedure: INSERTION VENA-CAVA FILTER Right Femoral;  Surgeon: Waynetta Sandy, MD;  Location: [facility];  Service: Vascular;  Laterality: Right;    Current Hospital Medications:  Home Meds:  No current facility-administered medications on file prior to encounter.   Current Outpatient Medications on File Prior to Encounter  Medication Sig Dispense Refill   finasteride (PROSCAR) 5 MG tablet Take 5 mg by mouth daily.     folic acid (FOLVITE) 1 MG tablet Take 1 mg by mouth daily.     gabapentin (NEURONTIN) 300 MG capsule Take 300 mg by mouth 3 (three) times daily.     magnesium oxide (MAG-OX) 400 MG tablet Take 400 mg by mouth daily.      metoprolol succinate (TOPROL-XL) 100 MG 24 hr tablet Take 100 mg by mouth daily.     oxycodone (OXY-IR) 5 MG capsule Take 5 mg by  mouth every 6 (six) hours as needed for pain.     Prenatal Vit-Fe Fumarate-FA (PRENATAL MULTIVITAMIN) TABS tablet Take 1 tablet by mouth daily at 12 noon.     vitamin B-12 (CYANOCOBALAMIN) 1000 MCG tablet Take 1,000 mcg by mouth daily.       Scheduled Meds: Continuous Infusions: PRN Meds:.  Allergies: No Known Allergies  Family History  Problem Relation Age of Onset   Lung cancer Mother    Cervical cancer Sister    Breast cancer Neg Hx    Pancreatic cancer Neg Hx    Colon cancer Neg Hx     Social History:  reports that he quit smoking about 3 years ago. His smoking use included cigarettes. He has a 30.00  pack-year smoking history. He has never used smokeless tobacco. He reports that he does not currently use alcohol. He reports that he does not use drugs.  ROS: A complete review of systems was performed.  All systems are negative except for pertinent findings as noted.  Physical Exam:  Vital signs in last 24 hours: Temp:  [100.5 F (38.1 C)] 100.5 F (38.1 C) (08/14 1929) Pulse Rate:  [77-80] 77 (08/14 2215) Resp:  [17-18] 18 (08/14 2145) BP: (122-150)/(76-90) 148/89 (08/14 2215) SpO2:  [97 %-99 %] 97 % (08/14 2215) Weight:  [87.1 kg] 87.1 kg (08/14 2012) Constitutional:  Alert and oriented, No acute distress Cardiovascular: Regular rate and rhythm Respiratory: Normal respiratory effort, Lungs clear bilaterally GI: Abdomen is soft, nontender, nondistended, no abdominal masses GU: No CVA tenderness; Foley catheter and suprapubic tube in place with dark Kool-Aid colored urine in each leg bag. Neurologic: Grossly intact, no focal deficits Psychiatric: Normal mood and affect  Laboratory Data:  Recent Labs    02/12/22 2035  WBC 8.8  HGB 8.8*  HCT 28.1*  PLT 239    Recent Labs    02/12/22 2035  NA 139  K 3.8  CL 108  GLUCOSE 110*  BUN 14  CALCIUM 8.4*  CREATININE 1.03     Results for orders placed or performed during the hospital encounter of 02/12/22 (from the past 24 hour(s))  Comprehensive metabolic panel     Status: Abnormal   Collection Time: 02/12/22  8:35 PM  Result Value Ref Range   Sodium 139 135 - 145 mmol/L   Potassium 3.8 3.5 - 5.1 mmol/L   Chloride 108 98 - 111 mmol/L   CO2 23 22 - 32 mmol/L   Glucose, Bld 110 (H) 70 - 99 mg/dL   BUN 14 8 - 23 mg/dL   Creatinine, Ser 1.03 0.61 - 1.24 mg/dL   Calcium 8.4 (L) 8.9 - 10.3 mg/dL   Total Protein 6.2 (L) 6.5 - 8.1 g/dL   Albumin 2.9 (L) 3.5 - 5.0 g/dL   AST 21 15 - 41 U/L   ALT 12 0 - 44 U/L   Alkaline Phosphatase 54 38 - 126 U/L   Total Bilirubin 0.3 0.3 - 1.2 mg/dL   GFR, Estimated >60 >60 mL/min    Anion gap 8 5 - 15  CBC with Differential     Status: Abnormal   Collection Time: 02/12/22  8:35 PM  Result Value Ref Range   WBC 8.8 4.0 - 10.5 K/uL   RBC 3.15 (L) 4.22 - 5.81 MIL/uL   Hemoglobin 8.8 (L) 13.0 - 17.0 g/dL   HCT 28.1 (L) 39.0 - 52.0 %   MCV 89.2 80.0 - 100.0 fL   MCH 27.9 26.0 - 34.0  pg   MCHC 31.3 30.0 - 36.0 g/dL   RDW 18.1 (H) 11.5 - 15.5 %   Platelets 239 150 - 400 K/uL   nRBC 0.0 0.0 - 0.2 %   Neutrophils Relative % 75 %   Neutro Abs 6.6 1.7 - 7.7 K/uL   Lymphocytes Relative 10 %   Lymphs Abs 0.9 0.7 - 4.0 K/uL   Monocytes Relative 12 %   Monocytes Absolute 1.0 0.1 - 1.0 K/uL   Eosinophils Relative 3 %   Eosinophils Absolute 0.3 0.0 - 0.5 K/uL   Basophils Relative 0 %   Basophils Absolute 0.0 0.0 - 0.1 K/uL   Immature Granulocytes 0 %   Abs Immature Granulocytes 0.02 0.00 - 0.07 K/uL  Urinalysis, Routine w reflex microscopic     Status: Abnormal   Collection Time: 02/12/22  9:13 PM  Result Value Ref Range   Color, Urine RED (A) YELLOW   APPearance TURBID (A) CLEAR   Specific Gravity, Urine  1.005 - 1.030    TEST NOT REPORTED DUE TO COLOR INTERFERENCE OF URINE PIGMENT   pH  5.0 - 8.0    TEST NOT REPORTED DUE TO COLOR INTERFERENCE OF URINE PIGMENT   Glucose, UA (A) NEGATIVE mg/dL    TEST NOT REPORTED DUE TO COLOR INTERFERENCE OF URINE PIGMENT   Hgb urine dipstick (A) NEGATIVE    TEST NOT REPORTED DUE TO COLOR INTERFERENCE OF URINE PIGMENT   Bilirubin Urine (A) NEGATIVE    TEST NOT REPORTED DUE TO COLOR INTERFERENCE OF URINE PIGMENT   Ketones, ur (A) NEGATIVE mg/dL    TEST NOT REPORTED DUE TO COLOR INTERFERENCE OF URINE PIGMENT   Protein, ur (A) NEGATIVE mg/dL    TEST NOT REPORTED DUE TO COLOR INTERFERENCE OF URINE PIGMENT   Nitrite (A) NEGATIVE    TEST NOT REPORTED DUE TO COLOR INTERFERENCE OF URINE PIGMENT   Leukocytes,Ua (A) NEGATIVE    TEST NOT REPORTED DUE TO COLOR INTERFERENCE OF URINE PIGMENT   Bacteria, UA NONE SEEN NONE SEEN   No results  found for this or any previous visit (from the past 240 hour(s)).  Renal Function: Recent Labs    02/12/22 2035  CREATININE 1.03   Estimated Creatinine Clearance: 66.8 mL/min (by C-G formula based on SCr of 1.03 mg/dL).  Radiologic Imaging: No results found.  I independently reviewed the above imaging studies.  Impression/Recommendation 76 yo M with complex GU history with hematuria. I hand irrigated through the suprapubic tube and the Foley until his urine was light pink.  I got out 2 or 3 old appearing clots. No acute intervention indicated at this time.  The patient will likely be observed overnight; if this is the case I recommend gentle continuous bladder irrigation which should be held in the morning for assessment.   Donald Pore MD 02/12/2022, 10:17 PM  [facility]  Pager: [phone]

## 2022-02-12 NOTE — ED Provider Triage Note (Signed)
Emergency Medicine Provider Triage Evaluation Note  Nathan [name] , a 76 y.o. male  was evaluated in triage.  Pt complains of blood in his Foley bag.  Patient is accompanied by his daughter who states that they first noticed the urine in the Foley bag becoming blood-tinged on Friday.  This morning, she noticed frank blood in the bag and she tried flushing it and was unable to flush the catheter due to a clot.  She then brought him to the urologist and they were also having trouble flushing the catheter so decided to replace the entire system.  She brought him home and this evening, she noticed large amounts of frank blood in the Foley bag again and decided to bring him to the emergency department.  She states that when he was here last, he needed 8 units of blood and is concerned about his current bleeding.  He also has a suprapubic catheter.  Did not have a fever at home, however has a fever 100.5 degrees here today.  Review of Systems  Positive:  Negative:   Physical Exam  BP 122/77 (BP Location: Right Arm)   Pulse 79   Temp (!) 100.5 F (38.1 C) (Oral)   Resp 18   Ht '6\' 1"'$  (1.854 m)   Wt 87.1 kg   SpO2 97%   BMI 25.33 kg/m  Gen:   Awake, no distress   Resp:  Normal effort  MSK:   Moves extremities without difficulty  Other:  Suprapubic catheter in place without evidence of infection  Medical Decision Making  Medically screening exam initiated at 8:21 PM.  Appropriate orders placed.  Nathan [name] was informed that the remainder of the evaluation will be completed by another provider, this initial triage assessment does not replace that evaluation, and the importance of remaining in the ED until their evaluation is complete.     Tonye Pearson, [place] 02/12/22 2025

## 2022-02-12 NOTE — ED Triage Notes (Signed)
Pt reports with bleeding in his foley bag since Friday. Pt was recently hospitalized for the same issue.

## 2022-02-13 ENCOUNTER — Encounter (HOSPITAL_COMMUNITY): Payer: Self-pay | Admitting: Internal Medicine

## 2022-02-13 ENCOUNTER — Inpatient Hospital Stay (HOSPITAL_COMMUNITY): Payer: Medicare Other

## 2022-02-13 DIAGNOSIS — I10 Essential (primary) hypertension: Secondary | ICD-10-CM | POA: Diagnosis not present

## 2022-02-13 DIAGNOSIS — C61 Malignant neoplasm of prostate: Secondary | ICD-10-CM

## 2022-02-13 DIAGNOSIS — R310 Gross hematuria: Secondary | ICD-10-CM

## 2022-02-13 DIAGNOSIS — E119 Type 2 diabetes mellitus without complications: Secondary | ICD-10-CM

## 2022-02-13 DIAGNOSIS — R509 Fever, unspecified: Secondary | ICD-10-CM

## 2022-02-13 DIAGNOSIS — Z86718 Personal history of other venous thrombosis and embolism: Secondary | ICD-10-CM

## 2022-02-13 DIAGNOSIS — I480 Paroxysmal atrial fibrillation: Secondary | ICD-10-CM

## 2022-02-13 LAB — CBC
HCT: 27.4 % — ABNORMAL LOW (ref 39.0–52.0)
HCT: 29.6 % — ABNORMAL LOW (ref 39.0–52.0)
HCT: 28.7 % — ABNORMAL LOW (ref 39.0–52.0)
Hemoglobin: 8.4 g/dL — ABNORMAL LOW (ref 13.0–17.0)
Hemoglobin: 9 g/dL — ABNORMAL LOW (ref 13.0–17.0)
Hemoglobin: 8.8 g/dL — ABNORMAL LOW (ref 13.0–17.0)
MCH: 27.4 pg (ref 26.0–34.0)
MCH: 27.2 pg (ref 26.0–34.0)
MCH: 27.5 pg (ref 26.0–34.0)
MCHC: 30.7 g/dL (ref 30.0–36.0)
MCHC: 30.4 g/dL (ref 30.0–36.0)
MCHC: 30.7 g/dL (ref 30.0–36.0)
MCV: 89.3 fL (ref 80.0–100.0)
MCV: 89.4 fL (ref 80.0–100.0)
MCV: 89.7 fL (ref 80.0–100.0)
Platelets: 230 10*3/uL (ref 150–400)
Platelets: 235 10*3/uL (ref 150–400)
Platelets: 219 10*3/uL (ref 150–400)
RBC: 3.07 MIL/uL — ABNORMAL LOW (ref 4.22–5.81)
RBC: 3.31 MIL/uL — ABNORMAL LOW (ref 4.22–5.81)
RBC: 3.2 MIL/uL — ABNORMAL LOW (ref 4.22–5.81)
RDW: 18 % — ABNORMAL HIGH (ref 11.5–15.5)
RDW: 17.9 % — ABNORMAL HIGH (ref 11.5–15.5)
RDW: 17.9 % — ABNORMAL HIGH (ref 11.5–15.5)
WBC: 7.6 10*3/uL (ref 4.0–10.5)
WBC: 8 10*3/uL (ref 4.0–10.5)
WBC: 8.1 10*3/uL (ref 4.0–10.5)
nRBC: 0 % (ref 0.0–0.2)
nRBC: 0 % (ref 0.0–0.2)
nRBC: 0 % (ref 0.0–0.2)

## 2022-02-13 LAB — TYPE AND SCREEN
ABO/RH(D): B POS
Antibody Screen: NEGATIVE

## 2022-02-13 LAB — PROTIME-INR
INR: 1.2 (ref 0.8–1.2)
Prothrombin Time: 14.9 seconds (ref 11.4–15.2)

## 2022-02-13 LAB — APTT: aPTT: 36 seconds (ref 24–36)

## 2022-02-13 LAB — PROCALCITONIN: Procalcitonin: <0.1 ng/mL

## 2022-02-13 LAB — GLUCOSE, CAPILLARY
Glucose-Capillary: 93 mg/dL (ref 70–99)
Glucose-Capillary: 98 mg/dL (ref 70–99)
Glucose-Capillary: 141 mg/dL — ABNORMAL HIGH (ref 70–99)

## 2022-02-13 LAB — CBG MONITORING, ED: Glucose-Capillary: 103 mg/dL — ABNORMAL HIGH (ref 70–99)

## 2022-02-13 LAB — C-REACTIVE PROTEIN: CRP: 4.5 mg/dL — ABNORMAL HIGH (ref <1.0)

## 2022-02-13 LAB — SARS CORONAVIRUS 2 BY RT PCR: SARS Coronavirus 2 by RT PCR: NEGATIVE

## 2022-02-13 MED ORDER — HYDRALAZINE HCL 20 MG/ML IJ SOLN: 10.0000 mg | Freq: Four times a day (QID) | INTRAMUSCULAR | Status: DC | PRN

## 2022-02-13 MED ORDER — SODIUM CHLORIDE 0.9 % IV SOLN
1.0000 g | INTRAVENOUS | Status: DC
  Administered 2022-02-13: 1 g via INTRAVENOUS
  Filled 2022-02-13: qty 10

## 2022-02-13 MED ORDER — POLYETHYLENE GLYCOL 3350 17 G PO PACK: 17.0000 g | PACK | Freq: Every day | ORAL | Status: DC | PRN

## 2022-02-13 MED ORDER — LACTATED RINGERS IV SOLN: INTRAVENOUS | Status: DC

## 2022-02-13 MED ORDER — ACETAMINOPHEN 325 MG PO TABS: 650.0000 mg | ORAL_TABLET | Freq: Four times a day (QID) | ORAL | Status: DC | PRN

## 2022-02-13 MED ORDER — ONDANSETRON HCL 4 MG/2ML IJ SOLN: 4.0000 mg | Freq: Four times a day (QID) | INTRAMUSCULAR | Status: DC | PRN

## 2022-02-13 MED ORDER — SODIUM CHLORIDE 0.9 % IV SOLN: INTRAVENOUS | Status: DC

## 2022-02-13 MED ORDER — GABAPENTIN 300 MG PO CAPS
300.0000 mg | ORAL_CAPSULE | Freq: Three times a day (TID) | ORAL | Status: DC
  Administered 2022-02-13 – 2022-02-16 (×9): 300 mg via ORAL
  Filled 2022-02-13 (×9): qty 1

## 2022-02-13 MED ORDER — SODIUM CHLORIDE 0.9 % IR SOLN
3000.0000 mL | Status: DC
  Administered 2022-02-13 – 2022-02-14 (×2): 3000 mL

## 2022-02-13 MED ORDER — FINASTERIDE 5 MG PO TABS
5.0000 mg | ORAL_TABLET | Freq: Every day | ORAL | Status: DC
  Administered 2022-02-13 – 2022-02-16 (×4): 5 mg via ORAL
  Filled 2022-02-13 (×4): qty 1

## 2022-02-13 MED ORDER — ACETAMINOPHEN 650 MG RE SUPP: 650.0000 mg | Freq: Four times a day (QID) | RECTAL | Status: DC | PRN

## 2022-02-13 MED ORDER — ONDANSETRON HCL 4 MG PO TABS: 4.0000 mg | ORAL_TABLET | Freq: Four times a day (QID) | ORAL | Status: DC | PRN

## 2022-02-13 MED ORDER — OXYCODONE HCL 5 MG PO TABS: 5.0000 mg | ORAL_TABLET | Freq: Four times a day (QID) | ORAL | Status: DC | PRN

## 2022-02-13 MED ORDER — INSULIN ASPART 100 UNIT/ML IJ SOLN
0.0000 [IU] | Freq: Three times a day (TID) | INTRAMUSCULAR | Status: DC
  Administered 2022-02-14 – 2022-02-15 (×3): 1 [IU] via SUBCUTANEOUS
  Filled 2022-02-13: qty 0.09

## 2022-02-13 MED ORDER — METOPROLOL SUCCINATE ER 50 MG PO TB24
100.0000 mg | ORAL_TABLET | Freq: Every day | ORAL | Status: DC
  Administered 2022-02-13 – 2022-02-16 (×4): 100 mg via ORAL
  Filled 2022-02-13 (×5): qty 2

## 2022-02-13 NOTE — Progress Notes (Signed)
Patient ID: Nathan [name], male   DOB: June 15, 1946, 76 y.o.   MRN: [id number]    Subjective: Pt came to the ED last night with recurrent gross hematuria.  Seen by Dr. Cain Sieve who had discussed starting CBI.  This has not yet been started.  Objective: Vital signs in last 24 hours: Temp:  [98.7 F (37.1 C)-100.5 F (38.1 C)] 98.7 F (37.1 C) (08/15 0355) Pulse Rate:  [71-82] 71 (08/15 0500) Resp:  [16-18] 18 (08/15 0500) BP: (122-153)/(76-90) 153/76 (08/15 0500) SpO2:  [91 %-99 %] 91 % (08/15 0500) Weight:  [87.1 kg] 87.1 kg (08/14 2012)  Intake/Output from previous day: No intake/output data recorded. Intake/Output this shift: No intake/output data recorded.  Physical Exam:  General: Alert and oriented Abd: SP tube and urethral catheter in place with grossly bloody urine in both drainage bags but draining well.  CBI has not been set up yet.  Lab Results: Recent Labs    02/12/22 2035  HGB 8.8*  HCT 28.1*   BMET Recent Labs    02/12/22 2035  NA 139  K 3.8  CL 108  CO2 23  GLUCOSE 110*  BUN 14  CREATININE 1.03  CALCIUM 8.4*     Studies/Results: DG Chest 1 View  Result Date: 02/13/2022 CLINICAL DATA:  75 year old male with increased hematuria. EXAM: CHEST  1 VIEW COMPARISON:  Portable chest 01/24/2022 and earlier. FINDINGS: Portable AP semi upright view at 0643 hours. Lung volumes and mediastinal contours are stable. Mild tortuosity of the thoracic aorta. Visualized tracheal air column is within normal limits. Allowing for portable technique the lungs are clear. No pneumothorax or pleural effusion. No acute osseous abnormality identified. IMPRESSION: Negative portable chest. Electronically Signed   By: Genevie Ann M.D.   On: 02/13/2022 07:35    Assessment/Plan: 1) Gross hematuria: This has been a recurrent and difficult to treat problem. Pt is s/p arterial embolization and cystoscopic clot evacuation/fulguration during his previous admission a few weeks ago.  Hgb current  stable compared to last discharge with repeat labs pending this morning.  Will begin CBI.  With low grade fever, reasonable to start antibiotics pending cultures.  Will follow and reassess later today.   LOS: 1 day   Dutch Gray 02/13/2022, 7:54 AM

## 2022-02-13 NOTE — Assessment & Plan Note (Signed)
   Currently rate controlled, currently in sinus rhythm  Continuing to abstain from anticoagulation  Monitoring on telemetry  Continue metoprolol

## 2022-02-13 NOTE — Progress Notes (Signed)
Patient ID: Nathan [name], male   DOB: [birth year], 76 y.o.   MRN: [id number]  Pt doing well.  Hgb stable. CBI adjusted to moderate drip. Urine mostly clear.

## 2022-02-13 NOTE — Assessment & Plan Note (Signed)
   Patient presenting with a fever of 100.5 F on arrival, patient is once again exhibiting fever similar to his prior presentation with hematuria.  At that time patient was treated empirically with intravenous antibiotics with urine cultures revealing no growth.   Unfortunately unable to interpret the urinalysis due to extensive blood in the sample  We will place patient back on empiric antibiotic therapy, obtain urine culture, monitor for current fevers

## 2022-02-13 NOTE — Assessment & Plan Note (Signed)
.   Patient been placed on Accu-Cheks before every meal and nightly with sliding scale insulin . Hemoglobin A1C 5.1% 11/2021 . Diabetic Diet

## 2022-02-13 NOTE — ED Provider Notes (Signed)
[facility] DEPT Provider Note  CSN: [id number] Arrival date & time: 02/12/22 1911  Chief Complaint(s) Hematuria  HPI Nathan [name] is a 76 y.o. male with PMH prostate cancer status post radioactive seed implant with suprapubic catheter and chronic hematuria, chronic A-fib not on anticoagulation secondary to hematuria, recent DVT status post IVC filter, T2DM, prolonged hospital admission and discharge on 01/28/2022 for gross hematuria and symptomatic anemia who presents emergency department for evaluation of gross hematuria.  Patient was seen in urology clinic today and his suprapubic catheter was flushed and his hematuria cleared but daughter states that his hematuria returned at home and he was found to be sleepier than normal.  She is quite concerned about this given his recent prolonged hospital stay and comes back to the emergency department for further evaluation.  He currently denies chest pain, shortness of breath, headache, abdominal pain or other systemic symptoms.  Of note the patient arrives minimally febrile to 100.5.   Past Medical History Past Medical History:  Diagnosis Date   Anginal pain ([place])    Atrial fibrillation ([place]) 01/2020   resolved now    Cancer of prostate ([place]) 2020   Chronic anticoagulation    Diabetes mellitus without complication ([place])    type 2 diet controlled   Dysrhythmia    a fib   Foley catheter in place    will be changed 05-16-20   HTN (hypertension)    Lipoma of chest wall    Pneumonia 01/2020   and uti in hospital for 1 week   WPW (Wolff-Parkinson-White syndrome)    No prior ablation   Patient Active Problem List   Diagnosis Date Noted   Hematuria 02/12/2022   AKI (acute kidney injury) ([place]) 01/24/2022   Fever 01/24/2022   Acute blood loss anemia (ABLA) 01/24/2022   History of DVT (deep vein thrombosis) 01/12/2022   Gross hematuria 01/12/2022   Symptomatic anemia 01/11/2022   Acute DVT (deep venous  thrombosis) ([place]) 12/09/2021   Urinary retention 12/09/2021   UTI (urinary tract infection) 12/09/2021   HTN (hypertension) 12/09/2021   DM2 (diabetes mellitus, type 2) ([place]) 12/09/2021   Normocytic anemia 12/09/2021   Septic shock ([place]) 02/22/2020   Malignant neoplasm of prostate ([place]) 07/07/2019   Atrial fibrillation ([place]) 06/05/2019   Long term (current) use of anticoagulants 06/05/2019   Home Medication(s) Prior to Admission medications   Medication Sig Start Date End Date Taking? Authorizing Provider  acetic acid 0.25 % irrigation [id number] Milliliter(s) intravesical use Every Other Day 02/02/22   [provider]  finasteride (PROSCAR) 5 MG tablet Take 5 mg by mouth daily.    [provider]  folic acid (FOLVITE) 1 MG tablet Take 1 mg by mouth daily. 03/24/19   [provider]  gabapentin (NEURONTIN) 300 MG capsule Take 300 mg by mouth 3 (three) times daily. 10/27/21   [provider]  magnesium oxide (MAG-OX) 400 MG tablet Take 400 mg by mouth daily.  03/24/19   [provider]  metoprolol succinate (TOPROL-XL) 100 MG 24 hr tablet Take 100 mg by mouth daily. 11/14/21   [provider]  oxycodone (OXY-IR) 5 MG capsule Take 5 mg by mouth every 6 (six) hours as needed for pain.    [provider]  Prenatal Vit-Fe Fumarate-FA (PRENATAL MULTIVITAMIN) TABS tablet Take 1 tablet by mouth daily at 12 noon.    [provider]  vitamin B-12 (CYANOCOBALAMIN) 1000 MCG tablet Take 1,000 mcg by mouth daily.  [provider]                                                                                                                                    Past Surgical History Past Surgical History:  Procedure Laterality Date   CATARACT EXTRACTION Bilateral 2020   CYSTOSCOPY N/A 06/05/2021   Procedure: CYSTOSCOPY WITH SUPRA PUBIC TUBE PLACEMENT;  Surgeon: Raynelle Bring, MD;  Location: [facility];  Service: Urology;   Laterality: N/A;   CYSTOSCOPY WITH FULGERATION N/A 01/27/2022   Procedure: [place] WITH CLOT EVACUATION;  Surgeon: Ardis Hughs, MD;  Location: [facility];  Service: Urology;  Laterality: N/A;   IR 3D INDEPENDENT WKST  01/17/2022   IR ANGIOGRAM PELVIS SELECTIVE OR SUPRASELECTIVE  01/17/2022   IR ANGIOGRAM SELECTIVE EACH ADDITIONAL VESSEL  01/17/2022   IR ANGIOGRAM SELECTIVE EACH ADDITIONAL VESSEL  01/17/2022   IR EMBO ART  VEN HEMORR LYMPH EXTRAV  INC GUIDE ROADMAPPING  01/17/2022   IR US GUIDE VASC ACCESS RIGHT  01/17/2022   LIPOMA EXCISION Left 05/16/2020   Procedure: EXCISION LIPOMA LEFT CHEST WALL;  Surgeon: Kieth Brightly Arta Bruce, MD;  Location: [facility];  Service: General;  Laterality: Left;   RADIOACTIVE SEED IMPLANT  2020   TRANSURETHRAL RESECTION OF BLADDER TUMOR N/A 01/17/2022   Procedure: CYSTOSCOPY WITH CLOT EVACUATION, FULGERATION OF PROSTATE;  Surgeon: Festus Aloe, MD;  Location: [facility];  Service: Urology;  Laterality: N/A;   VENA CAVA FILTER PLACEMENT Right 12/10/2021   Procedure: INSERTION VENA-CAVA FILTER Right Femoral;  Surgeon: Waynetta Sandy, MD;  Location: [facility];  Service: Vascular;  Laterality: Right;   Family History Family History  Problem Relation Age of Onset   Lung cancer Mother    Cervical cancer Sister    Breast cancer Neg Hx    Pancreatic cancer Neg Hx    Colon cancer Neg Hx     Social History Social History   Tobacco Use   Smoking status: Former    Packs/day: 0.50    Years: 60.00    Total pack years: 30.00    Types: Cigarettes    Quit date: 01/31/2019    Years since quitting: 3.0   Smokeless tobacco: Never  Vaping Use   Vaping Use: Never used  Substance Use Topics   Alcohol use: Not Currently    Comment: no alochol since 2020   Drug use: Never   Allergies Patient has no known allergies.  Review of Systems Review of Systems  Genitourinary:  Positive for hematuria.    Physical Exam Vital  Signs  I have reviewed the triage vital signs BP (!) 144/76   Pulse 82   Temp 99.3 F (37.4 C) (Oral)   Resp 17   Ht '6\' 1"'$  (1.854 m)   Wt 87.1 kg   SpO2 95%   BMI 25.33 kg/m   Physical Exam Constitutional:  General: He is not in acute distress.    Appearance: Normal appearance.  HENT:     Head: Normocephalic and atraumatic.     Nose: No congestion or rhinorrhea.  Eyes:     General:        Right eye: No discharge.        Left eye: No discharge.     Extraocular Movements: Extraocular movements intact.     Pupils: Pupils are equal, round, and reactive to light.  Cardiovascular:     Rate and Rhythm: Normal rate and regular rhythm.     Heart sounds: No murmur heard. Pulmonary:     Effort: No respiratory distress.     Breath sounds: No wheezing or rales.  Abdominal:     General: There is no distension.     Tenderness: There is no abdominal tenderness.  Musculoskeletal:        General: Normal range of motion.     Cervical back: Normal range of motion.  Skin:    General: Skin is warm and dry.  Neurological:     General: No focal deficit present.     Mental Status: He is alert.     ED Results and Treatments Labs (all labs ordered are listed, but only abnormal results are displayed) Labs Reviewed  COMPREHENSIVE METABOLIC PANEL - Abnormal; Notable for the following components:      Result Value   Glucose, Bld 110 (*)    Calcium 8.4 (*)    Total Protein 6.2 (*)    Albumin 2.9 (*)    All other components within normal limits  CBC WITH DIFFERENTIAL/PLATELET - Abnormal; Notable for the following components:   RBC 3.15 (*)    Hemoglobin 8.8 (*)    HCT 28.1 (*)    RDW 18.1 (*)    All other components within normal limits  URINALYSIS, ROUTINE W REFLEX MICROSCOPIC - Abnormal; Notable for the following components:   Color, Urine RED (*)    APPearance TURBID (*)    Glucose, UA   (*)    Value: TEST NOT REPORTED DUE TO COLOR INTERFERENCE OF URINE PIGMENT   Hgb urine  dipstick   (*)    Value: TEST NOT REPORTED DUE TO COLOR INTERFERENCE OF URINE PIGMENT   Bilirubin Urine   (*)    Value: TEST NOT REPORTED DUE TO COLOR INTERFERENCE OF URINE PIGMENT   Ketones, ur   (*)    Value: TEST NOT REPORTED DUE TO COLOR INTERFERENCE OF URINE PIGMENT   Protein, ur   (*)    Value: TEST NOT REPORTED DUE TO COLOR INTERFERENCE OF URINE PIGMENT   Nitrite   (*)    Value: TEST NOT REPORTED DUE TO COLOR INTERFERENCE OF URINE PIGMENT   Leukocytes,Ua   (*)    Value: TEST NOT REPORTED DUE TO COLOR INTERFERENCE OF URINE PIGMENT   All other components within normal limits  URINE CULTURE  Radiology No results found.  Pertinent labs & imaging results that were available during my care of the patient were reviewed by me and considered in my medical decision making (see MDM for details).  Medications Ordered in ED Medications  cefTRIAXone (ROCEPHIN) 1 g in sodium chloride 0.9 % 100 mL IVPB (1 g Intravenous New Bag/Given 02/12/22 2328)                                                                                                                                     Procedures Procedures  (including critical care time)  Medical Decision Making / ED Course   This patient presents to the ED for concern of gross hematuria, this involves an extensive number of treatment options, and is a complaint that carries with it a high risk of complications and morbidity.  The differential diagnosis includes cystitis, prostatitis, bladder cancer, chronic hematuria, bladder stone  MDM: Patient seen emergency department for evaluation of gross hematuria.  Physical exam with suprapubic catheter bags with gross hematuria but is otherwise unremarkable.  Laboratory evaluation with a hemoglobin of 8.8, albumin 2.9 but is otherwise unremarkable.  Urinalysis is unable to be  interpreted secondary to gross hematuria.  Given patient's fever patient empirically started on ceftriaxone.  Urology was consulted and Dr. Cain Sieve is recommending hospital observation with light continuous bladder irrigation with urology follow-up in the morning.  Patient then admitted.   Additional history obtained: -Additional history obtained from daughter -External records from outside source obtained and reviewed including: Chart review including previous notes, labs, imaging, consultation notes   Lab Tests: -I ordered, reviewed, and interpreted labs.   The pertinent results include:   Labs Reviewed  COMPREHENSIVE METABOLIC PANEL - Abnormal; Notable for the following components:      Result Value   Glucose, Bld 110 (*)    Calcium 8.4 (*)    Total Protein 6.2 (*)    Albumin 2.9 (*)    All other components within normal limits  CBC WITH DIFFERENTIAL/PLATELET - Abnormal; Notable for the following components:   RBC 3.15 (*)    Hemoglobin 8.8 (*)    HCT 28.1 (*)    RDW 18.1 (*)    All other components within normal limits  URINALYSIS, ROUTINE W REFLEX MICROSCOPIC - Abnormal; Notable for the following components:   Color, Urine RED (*)    APPearance TURBID (*)    Glucose, UA   (*)    Value: TEST NOT REPORTED DUE TO COLOR INTERFERENCE OF URINE PIGMENT   Hgb urine dipstick   (*)    Value: TEST NOT REPORTED DUE TO COLOR INTERFERENCE OF URINE PIGMENT   Bilirubin Urine   (*)    Value: TEST NOT REPORTED DUE TO COLOR INTERFERENCE OF URINE PIGMENT   Ketones, ur   (*)    Value: TEST NOT REPORTED DUE TO COLOR INTERFERENCE OF URINE PIGMENT   Protein, ur   (*)  Value: TEST NOT REPORTED DUE TO COLOR INTERFERENCE OF URINE PIGMENT   Nitrite   (*)    Value: TEST NOT REPORTED DUE TO COLOR INTERFERENCE OF URINE PIGMENT   Leukocytes,Ua   (*)    Value: TEST NOT REPORTED DUE TO COLOR INTERFERENCE OF URINE PIGMENT   All other components within normal limits  URINE CULTURE     Medicines  ordered and prescription drug management: Meds ordered this encounter  Medications   cefTRIAXone (ROCEPHIN) 1 g in sodium chloride 0.9 % 100 mL IVPB    Order Specific Question:   Antibiotic Indication:    Answer:   UTI    -I have reviewed the patients home medicines and have made adjustments as needed  Critical interventions none  Consultations Obtained: I requested consultation with the urologist Dr. Cain Sieve,  and discussed lab and imaging findings as well as pertinent plan - they recommend: CBI and hospital admission   Cardiac Monitoring: The patient was maintained on a cardiac monitor.  I personally viewed and interpreted the cardiac monitored which showed an underlying rhythm of: NSR  Social Determinants of Health:  Factors impacting patients care include: none   Reevaluation: After the interventions noted above, I reevaluated the patient and found that they have :improved  Co morbidities that complicate the patient evaluation  Past Medical History:  Diagnosis Date   Anginal pain ([place])    Atrial fibrillation ([place]) 01/2020   resolved now    Cancer of prostate ([place]) 2020   Chronic anticoagulation    Diabetes mellitus without complication ([place])    type 2 diet controlled   Dysrhythmia    a fib   Foley catheter in place    will be changed 05-16-20   HTN (hypertension)    Lipoma of chest wall    Pneumonia 01/2020   and uti in hospital for 1 week   WPW (Wolff-Parkinson-White syndrome)    No prior ablation      Dispostion: I considered admission for this patient, and given persistent hematuria and need for CBI patient was admitted     Final Clinical Impression(s) / ED Diagnoses Final diagnoses:  None     '@PCDICTATION'$ @    Teressa Lower, MD 02/13/22 [phone]

## 2022-02-13 NOTE — Assessment & Plan Note (Signed)
   Stage IIb disease  Complicated history of prostate cancer status post radioactive seed implants and suprapubic catheter placement  Outpatient follow-up with [facility]

## 2022-02-13 NOTE — H&P (Signed)
History and Physical    Patient: Nathan [name] MRN: [id number] DOA: 02/12/2022  Date of Service: the patient was seen and examined on 02/13/2022  Patient coming from: Home  Chief Complaint:  Chief Complaint  Patient presents with   Hematuria    HPI:   76 year old male with past medical history of hypertension, paroxysmal atrial fibrillation (not on anticoagulation), left lower extremity DVT (11/2021 managed with IVC filter placed by Dr. Donzetta Matters), non insulin dependent diabetes mellitus type 2, as well as prostate cancer status post radioactive seed implants status post suprapubic catheter placement complicated by chronic hematuria.  Patient now presents once again to [facility] emergency department with several days of hematuria.  Of note, presented July 13th with severe anemia of 4.9 due to persisting hematuria.  Managed with administration of 4 units of packed red blood cells.  Patient underwent continuous bladder irrigation with cystoscopy on 7/29 during which an oozing blood vessel was fulgurated by Dr. Louis Meckel.  Patient was eventually discharged on 7/30.  Patient is a poor historian and therefore majority of history is been obtained from a combination of discussions with emergency department staff, review of notes and discussions with family.  Family reports that patient's urinary catheter output became bloody on Friday.  Over the course of the next several days family attempted to manage this with frequent flushing of the catheter however they were unable to do so due to clotting.  Over the span of time patient also began to experience worsening generalized weakness.  Due to progressively worsening symptoms patient presented to see urology in clinic earlier in the day on 8/14 where the catheter was flushed.    Patient was then sent home but upon arriving home patient was seemingly increasingly lethargic prompting the daughter to bring the patient to [facility] emergency  department for evaluation.  Upon evaluation in the emergency department EDP discussed case with Dr. Cain Sieve with urology who came to evaluate the patient at the bedside, exchange the patient's suprapubic catheter with a 25 French three-way catheter and began continuous bladder irrigation.  They recommended hospitalization for continued close monitoring.  The hospitalist group was then called to assess the patient for admission to the hospital.  Review of Systems: Review of Systems  Gastrointestinal:  Positive for abdominal pain.  Genitourinary:  Positive for hematuria.  Neurological:  Positive for weakness.  All other systems reviewed and are negative.    Past Medical History:  Diagnosis Date   Anginal pain ([place])    Atrial fibrillation ([place]) 01/2020   resolved now    Cancer of prostate ([place]) 2020   Chronic anticoagulation    Diabetes mellitus without complication ([place])    type 2 diet controlled   Dysrhythmia    a fib   Foley catheter in place    will be changed 05-16-20   HTN (hypertension)    Lipoma of chest wall    Pneumonia 01/2020   and uti in hospital for 1 week   WPW (Wolff-Parkinson-White syndrome)    No prior ablation    Past Surgical History:  Procedure Laterality Date   CATARACT EXTRACTION Bilateral 2020   CYSTOSCOPY N/A 06/05/2021   Procedure: CYSTOSCOPY WITH SUPRA PUBIC TUBE PLACEMENT;  Surgeon: Raynelle Bring, MD;  Location: [facility];  Service: Urology;  Laterality: N/A;   CYSTOSCOPY WITH FULGERATION N/A 01/27/2022   Procedure: [place] WITH CLOT EVACUATION;  Surgeon: Ardis Hughs, MD;  Location: [facility];  Service: Urology;  Laterality:  N/A;   IR 3D INDEPENDENT WKST  01/17/2022   IR ANGIOGRAM PELVIS SELECTIVE OR SUPRASELECTIVE  01/17/2022   IR ANGIOGRAM SELECTIVE EACH ADDITIONAL VESSEL  01/17/2022   IR ANGIOGRAM SELECTIVE EACH ADDITIONAL VESSEL  01/17/2022   IR EMBO ART  VEN HEMORR LYMPH EXTRAV  INC GUIDE ROADMAPPING  01/17/2022   IR US GUIDE  VASC ACCESS RIGHT  01/17/2022   LIPOMA EXCISION Left 05/16/2020   Procedure: EXCISION LIPOMA LEFT CHEST WALL;  Surgeon: Kieth Brightly Arta Bruce, MD;  Location: [place];  Service: General;  Laterality: Left;   RADIOACTIVE SEED IMPLANT  2020   TRANSURETHRAL RESECTION OF BLADDER TUMOR N/A 01/17/2022   Procedure: CYSTOSCOPY WITH CLOT EVACUATION, FULGERATION OF PROSTATE;  Surgeon: Festus Aloe, MD;  Location: [facility];  Service: Urology;  Laterality: N/A;   VENA CAVA FILTER PLACEMENT Right 12/10/2021   Procedure: INSERTION VENA-CAVA FILTER Right Femoral;  Surgeon: Waynetta [place], MD;  Location: [place];  Service: Vascular;  Laterality: Right;    Social History:  reports that he quit smoking about 3 years ago. His smoking use included cigarettes. He has a 30.00 pack-year smoking history. He has never used smokeless tobacco. He reports that he does not currently use alcohol. He reports that he does not use drugs.  No Known Allergies  Family History  Problem Relation Age of Onset   Lung cancer Mother    Cervical cancer Sister    Breast cancer Neg Hx    Pancreatic cancer Neg Hx    Colon cancer Neg Hx     Prior to Admission medications   Medication Sig Start Date End Date Taking? Authorizing Provider  acetic acid 0.25 % irrigation SMARTSIG:[id number] Milliliter(s) intravesical use Every Other Day 02/02/22   [provider]  finasteride (PROSCAR) 5 MG tablet Take 5 mg by mouth daily.    [provider]  folic acid (FOLVITE) 1 MG tablet Take 1 mg by mouth daily. 03/24/19   [provider]  gabapentin (NEURONTIN) 300 MG capsule Take 300 mg by mouth 3 (three) times daily. 10/27/21   [provider]  magnesium oxide (MAG-OX) 400 MG tablet Take 400 mg by mouth daily.  03/24/19   [provider]  metoprolol succinate (TOPROL-XL) 100 MG 24 hr tablet Take 100 mg by mouth daily. 11/14/21   [provider]  oxycodone (OXY-IR) 5 MG capsule  Take 5 mg by mouth every 6 (six) hours as needed for pain.    [provider]  Prenatal Vit-Fe Fumarate-FA (PRENATAL MULTIVITAMIN) TABS tablet Take 1 tablet by mouth daily at 12 noon.    [provider]  vitamin B-12 (CYANOCOBALAMIN) 1000 MCG tablet Take 1,000 mcg by mouth daily.    [provider]    Physical Exam:  Vitals:   02/13/22 0200 02/13/22 0355 02/13/22 0500 02/13/22 0810  BP: (!) 144/76  (!) 153/76 (!) 141/80  Pulse: 82  71 71  Resp: '17  18 18  '$ Temp:  98.7 F (37.1 C)  98.7 F (37.1 C)  TempSrc:  Oral  Oral  SpO2: 95%  91% 97%  Weight:      Height:        Constitutional: Awake alert and oriented x3, no associated distress.   Skin: no rashes, no lesions, good skin turgor noted. Eyes: Pupils are equally reactive to light.  No evidence of scleral icterus or conjunctival pallor.  ENMT: Dry mucous membranes noted.  Posterior pharynx clear of any exudate or lesions.  Neck: normal, supple, no masses, no thyromegaly.  No evidence of jugular venous distension.   Respiratory: clear to auscultation bilaterally, no wheezing, no crackles. Normal respiratory effort. No accessory muscle use.  Cardiovascular: Regular rate and rhythm, no murmurs / rubs / gallops. No extremity edema. 2+ pedal pulses. No carotid bruits.  Chest:   Nontender without crepitus or deformity.   Back:   Nontender without crepitus or deformity. Abdomen: Notable lower abdominal tenderness.  Abdomen is soft.  No evidence of intra-abdominal masses.  Positive bowel sounds noted in all quadrants.   GU: Both suprapubic three-way catheter as well as urethral three-way catheter in place both draining bloody urine with clots.  Some purulence noted around the urethral meatus. Musculoskeletal: No joint deformity upper and lower extremities. Good ROM, no contractures. Normal muscle tone.  Neurologic: CN 2-12 grossly intact. Sensation intact.  Patient moving all 4 extremities spontaneously.  Patient  is following all commands.  Patient is responsive to verbal stimuli.   Psychiatric: Patient exhibits angry mood with appropriate affect.  Patient seems to possess insight as to their current situation.    Data Reviewed:  I have personally reviewed and interpreted labs, imaging.  Significant findings are:  Chemistry revealing sodium 139, potassium 3.8, chloride 108, bicarbonate 23, BUN 14, creatinine 1.03 CBC revealing white blood cell count of 8.8, hemoglobin 8.8, hematocrit 28.1, platelet count of 239 Urinalysis unable to be interpreted due to blood.  EKG: Personally reviewed.  Rhythm is NSR with heart rate of 72BPM.  No dynamic ST segment changes appreciated.   Assessment and Plan: * Gross hematuria Patient presenting with ongoing gross hematuria with clots Currently, hemoglobin appears to be stable however patient has a potential of having precipitous drops in hemoglobin based on prior presentations Monitoring hemoglobin and hematocrit every 6 hours type and screen ordered, coagulation profile ordered Will transfuse in the 7-8 range based on the rate of bleeding Three-way catheters have been placed both in the urethra and suprapubically with urology's recommendation to perform continuous bladder irrigation Monitoring closely in case of hemodynamic compromise  Fever Patient presenting with a fever of 100.5 F on arrival, patient is once again exhibiting fever similar to his prior presentation with hematuria.  At that time patient was treated empirically with intravenous antibiotics with urine cultures revealing no growth.  Unfortunately unable to interpret the urinalysis due to extensive blood in the sample We will place patient back on empiric antibiotic therapy, obtain urine culture, monitor for current fevers  Paroxysmal atrial fibrillation (HCC) Currently rate controlled, currently in sinus rhythm Continuing to abstain from anticoagulation Monitoring on telemetry Continue  metoprolol  Type 2 diabetes mellitus without complication, without long-term current use of insulin ([place]) Patient been placed on Accu-Cheks before every meal and nightly with sliding scale insulin Hemoglobin A1C 5.1% 11/2021 Diabetic Diet   Essential hypertension Resume patients home regimen of oral antihypertensives with metoprolol PRN intravenous antihypertensives for excessively elevated blood pressure    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 Cain Sieve with Urology  Severity of Illness:  The appropriate patient status for this patient is INPATIENT. Inpatient status is judged to be reasonable and necessary in order to provide the required intensity of  service to ensure the patient's safety. The patient's presenting symptoms, physical exam findings, and initial radiographic and laboratory data in the context of their chronic comorbidities is felt to place them at high risk for further clinical deterioration. Furthermore, it is not anticipated that the patient will be medically stable for discharge from the hospital within 2 midnights of admission.   * I certify that at the point of admission it is my clinical judgment that the patient will require inpatient hospital care spanning beyond 2 midnights from the point of admission due to high intensity of service, high risk for further deterioration and high frequency of surveillance required.*  Author:  Vernelle Emerald MD  02/13/2022 8:37 AM

## 2022-02-13 NOTE — Assessment & Plan Note (Signed)
   Patient presenting with ongoing gross hematuria with clots  Currently, hemoglobin appears to be stable however patient has a potential of having precipitous drops in hemoglobin based on prior presentations  Monitoring hemoglobin and hematocrit every 6 hours  type and screen ordered, coagulation profile ordered  Will transfuse in the 7-8 range based on the rate of bleeding  Three-way catheters have been placed both in the urethra and suprapubically with urology's recommendation to perform continuous bladder irrigation  Monitoring closely in case of hemodynamic compromise

## 2022-02-13 NOTE — Assessment & Plan Note (Signed)
.   Resume patients home regimen of oral antihypertensives with metoprolol . PRN intravenous antihypertensives for excessively elevated blood pressure

## 2022-02-13 NOTE — Progress Notes (Signed)
  Progress Note   Patient: Nathan [name] LEX:[id number] DOB: 1946-06-01 DOA: 02/12/2022     1 DOS: the patient was seen and examined on 02/13/2022        Brief hospital course: This is a no charge note, for further details, please see the H&P by my partner Dr. Cyd Silence from earlier today     Assessment and Plan: * Gross hematuria CBI effluent clearing through the day -Consult urology, they have kept the patient on CBI through the day today and titrated down this afternoon   Fever -Follow urine culture - Continue antibiotics  Paroxysmal atrial fibrillation ([place]) - [place] nticoagulation  Type 2 diabetes mellitus without complication, without long-term current use of insulin (HCC)   Essential hypertension    Malignant neoplasm of prostate [facility])            Physical Exam: Vitals:   02/13/22 0810 02/13/22 1045 02/13/22 1145 02/13/22 1224  BP: (!) 141/80 (!) 155/77 (!) 165/84 (!) 157/74  Pulse: 71 73 73 79  Resp: 18 (!) '21 17 18  '$ Temp: 98.7 F (37.1 C)  99.1 F (37.3 C) 98.6 F (37 C)  TempSrc: Oral   Oral  SpO2: 97% 100% 99% 99%  Weight:      Height:               Disposition: Status is: Inpatient         Author: Edwin Dada, MD 02/13/2022 5:07 PM  For on call review [email or web address].

## 2022-02-14 DIAGNOSIS — I10 Essential (primary) hypertension: Secondary | ICD-10-CM | POA: Diagnosis not present

## 2022-02-14 DIAGNOSIS — R509 Fever, unspecified: Secondary | ICD-10-CM | POA: Diagnosis not present

## 2022-02-14 DIAGNOSIS — R310 Gross hematuria: Secondary | ICD-10-CM | POA: Diagnosis not present

## 2022-02-14 DIAGNOSIS — Z86718 Personal history of other venous thrombosis and embolism: Secondary | ICD-10-CM | POA: Diagnosis not present

## 2022-02-14 LAB — CBC
HCT: 26 % — ABNORMAL LOW (ref 39.0–52.0)
Hemoglobin: 8 g/dL — ABNORMAL LOW (ref 13.0–17.0)
MCH: 27.6 pg (ref 26.0–34.0)
MCHC: 30.8 g/dL (ref 30.0–36.0)
MCV: 89.7 fL (ref 80.0–100.0)
Platelets: 208 10*3/uL (ref 150–400)
RBC: 2.9 MIL/uL — ABNORMAL LOW (ref 4.22–5.81)
RDW: 17.7 % — ABNORMAL HIGH (ref 11.5–15.5)
WBC: 6.8 10*3/uL (ref 4.0–10.5)
nRBC: 0 % (ref 0.0–0.2)

## 2022-02-14 LAB — MAGNESIUM: Magnesium: 2 mg/dL (ref 1.7–2.4)

## 2022-02-14 LAB — GLUCOSE, CAPILLARY
Glucose-Capillary: 97 mg/dL (ref 70–99)
Glucose-Capillary: 107 mg/dL — ABNORMAL HIGH (ref 70–99)
Glucose-Capillary: 143 mg/dL — ABNORMAL HIGH (ref 70–99)
Glucose-Capillary: 98 mg/dL (ref 70–99)

## 2022-02-14 LAB — COMPREHENSIVE METABOLIC PANEL
ALT: 10 U/L (ref 0–44)
AST: 15 U/L (ref 15–41)
Albumin: 2.6 g/dL — ABNORMAL LOW (ref 3.5–5.0)
Alkaline Phosphatase: 49 U/L (ref 38–126)
Anion gap: 4 — ABNORMAL LOW (ref 5–15)
BUN: 9 mg/dL (ref 8–23)
CO2: 26 mmol/L (ref 22–32)
Calcium: 8.2 mg/dL — ABNORMAL LOW (ref 8.9–10.3)
Chloride: 112 mmol/L — ABNORMAL HIGH (ref 98–111)
Creatinine, Ser: 0.92 mg/dL (ref 0.61–1.24)
GFR, Estimated: >60 mL/min (ref >60)
Glucose, Bld: 99 mg/dL (ref 70–99)
Potassium: 4 mmol/L (ref 3.5–5.1)
Sodium: 142 mmol/L (ref 135–145)
Total Bilirubin: 0.5 mg/dL (ref 0.3–1.2)
Total Protein: 5.6 g/dL — ABNORMAL LOW (ref 6.5–8.1)

## 2022-02-14 LAB — URINE CULTURE: Culture: <10000 — AB

## 2022-02-14 NOTE — Progress Notes (Signed)
Patient ID: Nathan [name], male   DOB: 12/08/1945, 76 y.o.   MRN: [id number]    Subjective: No new complaints.  Objective: Vital signs in last 24 hours: Temp:  [98.4 F (36.9 C)-99.1 F (37.3 C)] 98.5 F (36.9 C) (08/16 0616) Pulse Rate:  [71-81] 74 (08/16 0616) Resp:  [17-21] 18 (08/16 0616) BP: (130-165)/(74-84) 133/84 (08/16 0616) SpO2:  [98 %-100 %] 98 % (08/16 0616)  Intake/Output from previous day: 08/15 0701 - 08/16 0700 In: 6595.4 [P.O.:240; I.V.:255.4; IV Piggyback:100] Out: 5650 [Urine:5650] Intake/Output this shift: Total I/O In: -  Out: 1800 [Urine:1800]  Physical Exam:  General: Alert and oriented GU: Urine clear on minimal CBI drip  Lab Results: Recent Labs    02/13/22 1416 02/13/22 1939 02/14/22 0434  HGB 9.0* 8.8* 8.0*  HCT 29.6* 28.7* 26.0*   BMET Recent Labs    02/12/22 2035 02/14/22 0434  NA 139 142  K 3.8 4.0  CL 108 112*  CO2 23 26  GLUCOSE 110* 99  BUN 14 9  CREATININE 1.03 0.92  CALCIUM 8.4* 8.2*     Studies/Results: DG Chest 1 View  Result Date: 02/13/2022 CLINICAL DATA:  76 year old male with increased hematuria. EXAM: CHEST  1 VIEW COMPARISON:  Portable chest 01/24/2022 and earlier. FINDINGS: Portable AP semi upright view at 0643 hours. Lung volumes and mediastinal contours are stable. Mild tortuosity of the thoracic aorta. Visualized tracheal air column is within normal limits. Allowing for portable technique the lungs are clear. No pneumothorax or pleural effusion. No acute osseous abnormality identified. IMPRESSION: Negative portable chest. Electronically Signed   By: Genevie Ann M.D.   On: 02/13/2022 07:35    Assessment/Plan: 1) Recurrent hematuria: Urine clearing.  CBI titrating down.  Hgb relatively stable.  Continue to monitor.   LOS: 2 days   Dutch Gray 02/14/2022, 8:49 AM

## 2022-02-14 NOTE — TOC Initial Note (Addendum)
Transition of Care [facility]) - Initial/Assessment Note    Patient Details  Name: Nathan [name] MRN: [id number] Date of Birth: 12-17-1945  [facility] of [facility] CM/SW Contact:    Leeroy [name], RN Phone Number: 02/14/2022, 8:01 AM  Clinical Narrative:                 Patient currently has hhc according to adult children will see which agency is covering. Hhc agency is [facility]. Expected Discharge Plan: [place] Barriers to Discharge: Continued Medical Work up   Patient Goals and CMS Choice Patient states their goals for this hospitalization and ongoing recovery are:: home with family CMS Medicare.gov Compare Post Acute Care list provided to:: Patient Represenative (must comment) (family members) Choice offered to / list presented to : Adult Children  Expected Discharge Plan and Services Expected Discharge Plan: [place]       Living arrangements for the past 2 months: Single Family Home                                      Prior Living Arrangements/Services Living arrangements for the past 2 months: Single Family Home Lives with:: Adult Children, Spouse Patient language and need for interpreter reviewed:: Yes              Criminal Activity/Legal Involvement Pertinent to Current Situation/Hospitalization: No - Comment as needed  Activities of Daily Living Home Assistive Devices/Equipment: None ADL Screening (condition at time of admission) Patient's cognitive ability adequate to safely complete daily activities?: Yes Is the patient deaf or have difficulty hearing?: No Does the patient have difficulty seeing, even when wearing glasses/contacts?: No Does the patient have difficulty concentrating, remembering, or making decisions?: No Patient able to express need for assistance with ADLs?: Yes Does the patient have difficulty dressing or bathing?: No Independently performs ADLs?: Yes (appropriate for developmental  age) Does the patient have difficulty walking or climbing stairs?: No Weakness of Legs: None Weakness of Arms/Hands: None  Permission Sought/Granted                  Emotional Assessment Appearance:: Appears stated age Attitude/Demeanor/Rapport: Gracious Affect (typically observed): Accepting Orientation: : Oriented to Self, Oriented to Place, Oriented to Situation Alcohol / Substance Use: Tobacco Use (quit 3 years ago) Psych Involvement: No (comment)  Admission diagnosis:  Gross hematuria [R31.0] Hematuria [R31.9] Patient Active Problem List   Diagnosis Date Noted   Fever 01/24/2022   Acute blood loss anemia (ABLA) 01/24/2022   History of DVT (deep vein thrombosis) 01/12/2022   Gross hematuria 01/12/2022   Symptomatic anemia 01/11/2022   Acute DVT (deep venous thrombosis) ([place]) 12/09/2021   UTI (urinary tract infection) 12/09/2021   Essential hypertension 12/09/2021   Type 2 diabetes mellitus without complication, without long-term current use of insulin ([place]) 12/09/2021   Normocytic anemia 12/09/2021   Malignant neoplasm of prostate ([place]) 07/07/2019   Paroxysmal atrial fibrillation ([place]) 06/05/2019   PCP:  [name], MD Pharmacy:   [place], [address] [place] [address] Phone: [phone] Fax: [phone]  [place] [address] Phone: [phone] Fax: [phone]     Social Determinants of Health (SDOH) Interventions    Readmission Risk Interventions    12/11/2021    2:36 PM  Readmission  Risk Prevention Plan  Post Dischage Appt Complete  Medication Screening Complete  Transportation Screening Complete

## 2022-02-14 NOTE — Progress Notes (Signed)
[facility]  PROGRESS NOTE  Nathan [name] EXB:[id number] DOB: 1945/09/13 DOA: 02/12/2022 PCP: [name], MD   Brief HPI:   76 year old male with past medical history of hypertension, paroxysmal atrial fibrillation not on anticoagulation, left lower extremity DVT, managed with IVC filter, diabetes mellitus type 2, prostate cancer s/p radioactive seed implant s/p suprapubic catheter placement complicated by chronic hematuria.  Came to hospital with several days of hematuria. Of note, presented July 13th with severe anemia of 4.9 due to persisting hematuria.  Managed with administration of 4 units of packed red blood cells.  Patient underwent continuous bladder irrigation with cystoscopy on 7/29 during which an oozing blood vessel was fulgurated by Dr. Louis Meckel.  Patient was eventually discharged on 7/30. In the ED urology was consulted, patient's suprapubic catheter was exchanged with 22 French three-way catheter and began continuous bladder irrigation.   Subjective   Patient seen and examined, denies any complaints.   Assessment/Plan:     Gross hematuria -Patient presented with ongoing gross hematuria with clots. -Urology consulted, patient started on continuous bladder irrigation -Urine is clearing, minimal CBI which has been titrated off per urology -Urology plans to remove urethral catheter in a.m.   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 Complicated history of prostate cancer status post radioactive seed implants and suprapubic catheter placement [facility]    Medications     finasteride  5 mg Oral Daily   gabapentin  300 mg Oral TID   insulin aspart  0-9 Units Subcutaneous TID AC & HS   metoprolol succinate  100 mg Oral Daily     Data Reviewed:   CBG:  Recent Labs  Lab 02/13/22 1236 02/13/22 1727 02/13/22 2101 02/14/22 0756 02/14/22 1151  GLUCAP 93 98 141* 97 107*    SpO2: 100 %    Vitals:   02/13/22 2218 02/14/22 0616 02/14/22 1018 02/14/22 1156  BP: 130/76 133/84 115/71 133/83  Pulse: 81 74 71 77  Resp: 20 18    Temp: 98.9 F (37.2 C) 98.5 F (36.9 C)  98.5 F (36.9 C)  TempSrc: Oral Oral  Oral  SpO2: 98% 98%  100%  Weight:      Height:          Data Reviewed:  Basic Metabolic Panel: Recent Labs  Lab 02/12/22 2035 02/14/22 0434  NA 139 142  K 3.8 4.0  CL 108 112*  CO2 23 26  GLUCOSE 110* 99  BUN 14 9  CREATININE 1.03 0.92  CALCIUM 8.4* 8.2*  MG  --  2.0    CBC: Recent Labs  Lab 02/12/22 2035 02/13/22 0850 02/13/22 1416 02/13/22 1939 02/14/22 0434  WBC 8.8 7.6 8.0 8.1 6.8  NEUTROABS 6.6  --   --   --   --   HGB 8.8* 8.4* 9.0* 8.8* 8.0*  HCT 28.1* 27.4* 29.6* 28.7* 26.0*  MCV 89.2 89.3 89.4 89.7 89.7  PLT 239 230 235 219 208    LFT Recent Labs  Lab 02/12/22 2035 02/14/22 0434  AST 21 15  ALT 12 10  ALKPHOS 54 49  BILITOT 0.3 0.5  PROT 6.2* 5.6*  ALBUMIN 2.9* 2.6*  Antibiotics: Anti-infectives (From admission, onward)    Start     Dose/Rate Route Frequency Ordered Stop   02/13/22 2200  cefTRIAXone (ROCEPHIN) 1 g in sodium chloride 0.9 % 100 mL IVPB        1 g 200 mL/hr over 30 Minutes Intravenous Every 24 hours 02/13/22 0627     02/12/22 2245  cefTRIAXone (ROCEPHIN) 1 g in sodium chloride 0.9 % 100 mL IVPB        1 g 200 mL/hr over 30 Minutes Intravenous  Once 02/12/22 2243 02/13/22 0817        DVT prophylaxis: SCDs  Code Status: Full code  Family Communication: No family at bedside   CONSULTS urology   Objective    Physical  Examination:   General-appears in no acute distress Heart-S1-S2, regular, no murmur auscultated Lungs-clear to auscultation bilaterally, no wheezing or crackles auscultated Abdomen-soft, nontender, no organomegaly Extremities-no edema in the lower extremities Neuro-alert, oriented x3, no focal deficit noted   Status is: Inpatient: Hematuria        [place]   [facility] If 7PM-7AM, please contact night-coverage at [email or web address], Office  [phone]   02/14/2022, 3:22 PM  LOS: 2 days

## 2022-02-14 NOTE — Progress Notes (Signed)
Patient ID: Nathan [name], male   DOB: [birth year], 76 y.o.   MRN: [id number]   Urine clearing.  Now on minimal CBI.  Continue to titrate to off tonight.  If clear off CBI tomorrow, will consider removal of urethral catheter.

## 2022-02-14 NOTE — Plan of Care (Signed)
  Problem: Clinical Measurements: Goal: Ability to maintain clinical measurements within normal limits will improve Outcome: Progressing Goal: Diagnostic test results will improve Outcome: Progressing   

## 2022-02-15 DIAGNOSIS — R509 Fever, unspecified: Secondary | ICD-10-CM | POA: Diagnosis not present

## 2022-02-15 DIAGNOSIS — I10 Essential (primary) hypertension: Secondary | ICD-10-CM | POA: Diagnosis not present

## 2022-02-15 DIAGNOSIS — R310 Gross hematuria: Secondary | ICD-10-CM | POA: Diagnosis not present

## 2022-02-15 DIAGNOSIS — Z86718 Personal history of other venous thrombosis and embolism: Secondary | ICD-10-CM | POA: Diagnosis not present

## 2022-02-15 LAB — CBC
HCT: 25.7 % — ABNORMAL LOW (ref 39.0–52.0)
Hemoglobin: 8 g/dL — ABNORMAL LOW (ref 13.0–17.0)
MCH: 27.8 pg (ref 26.0–34.0)
MCHC: 31.1 g/dL (ref 30.0–36.0)
MCV: 89.2 fL (ref 80.0–100.0)
Platelets: 199 10*3/uL (ref 150–400)
RBC: 2.88 MIL/uL — ABNORMAL LOW (ref 4.22–5.81)
RDW: 17.2 % — ABNORMAL HIGH (ref 11.5–15.5)
WBC: 6.3 10*3/uL (ref 4.0–10.5)
nRBC: 0 % (ref 0.0–0.2)

## 2022-02-15 LAB — GLUCOSE, CAPILLARY
Glucose-Capillary: 98 mg/dL (ref 70–99)
Glucose-Capillary: 133 mg/dL — ABNORMAL HIGH (ref 70–99)
Glucose-Capillary: 124 mg/dL — ABNORMAL HIGH (ref 70–99)
Glucose-Capillary: 139 mg/dL — ABNORMAL HIGH (ref 70–99)

## 2022-02-15 MED ORDER — CHLORHEXIDINE GLUCONATE CLOTH 2 % EX PADS
6.0000 | MEDICATED_PAD | Freq: Every day | CUTANEOUS | Status: DC
  Administered 2022-02-15 – 2022-02-16 (×2): 6 via TOPICAL

## 2022-02-15 NOTE — Progress Notes (Signed)
[facility]  PROGRESS NOTE  Nathan [name] WPY:[id number] DOB: Dec 02, 1945 DOA: 02/12/2022 PCP: [name], MD   Brief HPI:   76 year old male with past medical history of hypertension, paroxysmal atrial fibrillation not on anticoagulation, left lower extremity DVT, managed with IVC filter, diabetes mellitus type 2, prostate cancer s/p radioactive seed implant s/p suprapubic catheter placement complicated by chronic hematuria.  Came to hospital with several days of hematuria. Of note, presented July 13th with severe anemia of 4.9 due to persisting hematuria.  Managed with administration of 4 units of packed red blood cells.  Patient underwent continuous bladder irrigation with cystoscopy on 7/29 during which an oozing blood vessel was fulgurated by Dr. Louis Meckel.  Patient was eventually discharged on 7/30. In the ED urology was consulted, patient's suprapubic catheter was exchanged with 22 French three-way catheter and began continuous bladder irrigation.   Subjective   Patient seen and examined, no new complaints.   Assessment/Plan:     Gross hematuria -Patient presented with ongoing gross hematuria with clots. -Urology consulted, patient started on continuous bladder irrigation -Urine is clearing, minimal CBI which has been titrated off per urology -Urology plans to continue with indwelling Foley catheter and then follow-up in 1 week and then remove in the office once urine remains clear.  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 Complicated  history of prostate cancer status post radioactive seed implants and suprapubic catheter placement Outpatient follow-up with [facility]    Medications     Chlorhexidine Gluconate Cloth  6 each Topical Daily   finasteride  5 mg Oral Daily   gabapentin  300 mg Oral TID   insulin aspart  0-9 Units Subcutaneous TID AC & HS   metoprolol succinate  100 mg Oral Daily     Data Reviewed:   CBG:  Recent Labs  Lab 02/14/22 1151 02/14/22 1652 02/14/22 2002 02/15/22 0728 02/15/22 1146  GLUCAP 107* 143* 98 98 133*    SpO2: 97 %    Vitals:   02/14/22 1156 02/14/22 2039 02/15/22 0607 02/15/22 1241  BP: 133/83 138/77 138/83 (!) 141/76  Pulse: 77 74 72 79  Resp:  '18 16 20  '$ Temp: 98.5 F (36.9 C) 99.2 F (37.3 C) 98.7 F (37.1 C) 98.4 F (36.9 C)  TempSrc: Oral Oral Oral Oral  SpO2: 100% 98% 97% 97%  Weight:      Height:          Data Reviewed:  Basic Metabolic Panel: Recent Labs  Lab 02/12/22 2035 02/14/22 0434  NA 139 142  K 3.8 4.0  CL 108 112*  CO2 23 26  GLUCOSE 110* 99  BUN 14 9  CREATININE 1.03 0.92  CALCIUM 8.4* 8.2*  MG  --  2.0    CBC: Recent Labs  Lab 02/12/22 2035 02/13/22 0850 02/13/22 1416 02/13/22 1939 02/14/22 0434 02/15/22 1214  WBC 8.8 7.6 8.0 8.1 6.8 6.3  NEUTROABS 6.6  --   --   --   --   --   HGB 8.8* 8.4* 9.0* 8.8* 8.0* 8.0*  HCT 28.1* 27.4* 29.6* 28.7* 26.0* 25.7*  MCV 89.2  89.3 89.4 89.7 89.7 89.2  PLT 239 230 235 219 208 199    LFT Recent Labs  Lab 02/12/22 2035 02/14/22 0434  AST 21 15  ALT 12 10  ALKPHOS 54 49  BILITOT 0.3 0.5  PROT 6.2* 5.6*  ALBUMIN 2.9* 2.6*     Antibiotics: Anti-infectives (From admission, onward)    Start     Dose/Rate Route Frequency Ordered Stop   02/13/22 2200  cefTRIAXone (ROCEPHIN) 1 g in sodium chloride 0.9 % 100 mL IVPB  Status:  Discontinued        1 g 200 mL/hr over 30 Minutes Intravenous Every 24 hours 02/13/22 0627 02/14/22 1541   02/12/22 2245  cefTRIAXone (ROCEPHIN)  1 g in sodium chloride 0.9 % 100 mL IVPB        1 g 200 mL/hr over 30 Minutes Intravenous  Once 02/12/22 2243 02/13/22 0817        DVT prophylaxis: SCDs  Code Status: Full code  Family Communication: No family at bedside   CONSULTS urology   Objective    Physical Examination:   General-appears in no acute distress Heart-S1-S2, regular, no murmur auscultated Lungs-clear to auscultation bilaterally, no wheezing or crackles auscultated Abdomen-soft, nontender, no organomegaly Extremities-no edema in the lower extremities Neuro-alert, oriented x3, no focal deficit noted.   Status is: Inpatient: Hematuria        [facility] If 7PM-7AM, please contact night-coverage at [email or web address], Office  [phone]   02/15/2022, 2:20 PM  LOS: 3 days

## 2022-02-15 NOTE — Care Management Important Message (Signed)
Important Message  Patient Details IM Letter placed in Patients room. Name: Nathan [name] MRN: [id number] Date of Birth: 09-10-1945   Medicare Important Message Given:  Yes     Kerin Salen 02/15/2022, 1:02 PM

## 2022-02-15 NOTE — Progress Notes (Signed)
Patient ID: Nathan [name], male   DOB: 02-20-1946, 76 y.o.   MRN: [id number]    Subjective: No new complaints overnight.  Objective: Vital signs in last 24 hours: Temp:  [98.5 F (36.9 C)-99.2 F (37.3 C)] 98.7 F (37.1 C) (08/17 0607) Pulse Rate:  [71-77] 72 (08/17 0607) Resp:  [16-18] 16 (08/17 0607) BP: (115-138)/(71-83) 138/83 (08/17 0607) SpO2:  [97 %-100 %] 97 % (08/17 0607)  Intake/Output from previous day: 08/16 0701 - 08/17 0700 In: 4260 [P.O.:960] Out: 5500 [Urine:5500] Intake/Output this shift: No intake/output data recorded.  Physical Exam:  General: Alert and oriented GU: Urine clear with CBI barely dripping.  Lab Results: Recent Labs    02/13/22 1416 02/13/22 1939 02/14/22 0434  HGB 9.0* 8.8* 8.0*  HCT 29.6* 28.7* 26.0*   BMET Recent Labs    02/12/22 2035 02/14/22 0434  NA 139 142  K 3.8 4.0  CL 108 112*  CO2 23 26  GLUCOSE 110* 99  BUN 14 9  CREATININE 1.03 0.92  CALCIUM 8.4* 8.2*     Studies/Results: No results found.  Assessment/Plan: 1) Hematuria related to BPH/radiation changes of bladder/prostate:  Urine now clear.  Will hold CBI and plug urethral catheter.  SP tube placed back to drainage.  Will re-evaluate later this morning.  If urine remains clear, will be stable for discharge from a urologic standpoint.  May leave indwelling urethral catheter for now with plans to have him f/u in a week to have that removed if urine remains mostly clear.   LOS: 3 days   Dutch Gray 02/15/2022, 7:24 AM

## 2022-02-15 NOTE — Progress Notes (Signed)
   PT Cancellation Note  Patient Details Name: Nathan [name] MRN: [id number] DOB: 10/25/1945   Cancelled Treatment:    Reason Eval/Treat Not Completed: Patient declined, States that he just ate and wanted it to settle. Tried  earnestly to get patient to ambulate but he still declined. Will check back another time.  [place] Office [phone] Weekend [phone].sign   Claretha Cooper 02/15/2022, 1:41 PM

## 2022-02-15 NOTE — Progress Notes (Signed)
Patient ID: Nathan [name], male   DOB: May 27, 1946, 76 y.o.   MRN: [id number]   Urine remains clear off CBI.  [place] for discharge from a urologic standpoint.  Will leave SP tube to drainage and will leave urethral catheter in place for now plugged.  Will arrange outpatient f/u in about a week to remove urethral catheter.

## 2022-02-16 ENCOUNTER — Ambulatory Visit: Payer: Medicare Other | Admitting: Vascular Surgery

## 2022-02-16 DIAGNOSIS — R310 Gross hematuria: Secondary | ICD-10-CM | POA: Diagnosis not present

## 2022-02-16 DIAGNOSIS — I10 Essential (primary) hypertension: Secondary | ICD-10-CM | POA: Diagnosis not present

## 2022-02-16 DIAGNOSIS — R509 Fever, unspecified: Secondary | ICD-10-CM | POA: Diagnosis not present

## 2022-02-16 DIAGNOSIS — Z86718 Personal history of other venous thrombosis and embolism: Secondary | ICD-10-CM | POA: Diagnosis not present

## 2022-02-16 LAB — GLUCOSE, CAPILLARY: Glucose-Capillary: 109 mg/dL — ABNORMAL HIGH (ref 70–99)

## 2022-02-16 NOTE — Progress Notes (Signed)
Patient ID: Nathan [name], male   DOB: 1946-04-03, 76 y.o.   MRN: [id number]  Urine in tubing still clear with some old blood sediment.  [place] for discharge.  Will arrange f/u as outpatient in one week for removal of urethral catheter at that time.

## 2022-02-16 NOTE — TOC Transition Note (Signed)
Transition of Care [facility]) - CM/SW Discharge Note   Patient Details  Name: Nathan [name] MRN: [id number] Date of Birth: 30-Mar-1946  [facility]) CM/SW Contact:  Roseanne Kaufman, RN Phone Number: 02/16/2022, 1:43 PM   Clinical Narrative:   HHPT, OT will resume with [place], MD, RN notified. This RNCM will include on AVS at discharge.  No additional TOC needs at this time.      Barriers to Discharge: No Barriers Identified   Patient Goals and CMS Choice Patient states their goals for this hospitalization and ongoing recovery are:: return home with home health CMS Medicare.gov Compare Post Acute Care list provided to:: Patient Represenative (must comment) (family members) Choice offered to / list presented to : Adult Children  Discharge Placement     Home with HHPT/OT          Discharge Plan and Services   Discharge Planning Services: CM Consult                      HH Arranged: OT, PT [place] city  Agency: [place] Date Media: 02/16/22 Time [place]: [place] Representative spoke with at [place]: [place] Determinants of Health ([place]) Interventions     Readmission Risk Interventions    02/14/2022    9:26 AM 12/11/2021    2:36 PM  Readmission Risk Prevention Plan  Post Dischage Appt  Complete  Medication Screening  Complete  Transportation Screening Complete Complete  PCP or Specialist Appt within 5-7 Days Complete   Home Care Screening Complete   Medication Review (RN CM) Complete

## 2022-02-16 NOTE — Evaluation (Signed)
Physical Therapy One Time Evaluation Patient Details Name: Jamani Bearce MRN: [id number] DOB: 09-May-1946 Today's Date: 02/16/2022  History of Present Illness  76 y.o. male past medical history significant for prostate cancer status post radioactive seed implant with suprapubic catheter and chronic hematuria, chronic atrial fibrillation, diabetes mellitus type 2, recent history of DVT status post IVC filter on 12/10/21 who comes in for gross hematuria and with recent admission  for Acute blood loss anemia/Symptomatic anemia secondary to gross hematuria and S/P cystoscopy and fulgration of prostate 01/17/22.  Pt admitted 02/12/22 for Gross hematuria  Clinical Impression  Patient evaluated by Physical Therapy with no further acute PT needs identified. All education has been completed and the patient has no further questions.  Pt ambulated 400 ft in hallway with RW and reports feeling ready to d/c home.  Pt states he has a RW at home and lives with his daughter.  Pt would benefit from mobilizing with staff if remains in acute setting however no further skilled PT needs identified. See below for any follow-up Physical Therapy or equipment needs. PT is signing off. Thank you for this referral.        Recommendations for follow up therapy are one component of a multi-disciplinary discharge planning process, led by the attending physician.  Recommendations may be updated based on patient status, additional functional criteria and insurance authorization.  Follow Up Recommendations No PT follow up      Assistance Recommended at Discharge PRN  Patient can return home with the following  Help with stairs or ramp for entrance    Equipment Recommendations None recommended by PT  Recommendations for Other Services       Functional Status Assessment Patient has not had a recent decline in their functional status     Precautions / Restrictions Precautions Precautions: Fall Precaution Comments: suprapubic  catheter      Mobility  Bed Mobility Overal bed mobility: Modified Independent                  Transfers Overall transfer level: Needs assistance Equipment used: Rolling walker (2 wheels) Transfers: Sit to/from Stand Sit to Stand: Supervision                Ambulation/Gait Ambulation/Gait assistance: Supervision, Min guard Gait Distance (Feet): 400 Feet Assistive device: Rolling walker (2 wheels) Gait Pattern/deviations: Step-through pattern, Decreased stride length, Trunk flexed       General Gait Details: verbal cues for posture, RW positioning, denies any symptoms  Stairs            Wheelchair Mobility    Modified Rankin (Stroke Patients Only)       Balance Overall balance assessment: Needs assistance         Standing balance support: Reliant on assistive device for balance, Bilateral upper extremity supported Standing balance-Leahy Scale: Poor Standing balance comment: utilized RW for ambulation                             Pertinent Vitals/Pain Pain Assessment Pain Assessment: No/denies pain    Home Living Family/patient expects to be discharged to:: Private residence Living Arrangements: Children (daughter) Available Help at Discharge: Family;Available 24 hours/day Type of Home: House Home Access: Stairs to enter Entrance Stairs-Rails: Right Entrance Stairs-Number of Steps: 2 Alternate Level Stairs-Number of Steps: 14 Home Layout: Two level;Bed/bath upstairs Home Equipment: Cane - single point;Grab bars - tub/shower;Grab bars - toilet;Shower Land (2 wheels)  Prior Function Prior Level of Function : Independent/Modified Independent               ADLs Comments: family assists with IADLS     Hand Dominance        Extremity/Trunk Assessment        Lower Extremity Assessment Lower Extremity Assessment: Overall WFL for tasks assessed       Communication   Communication: No  difficulties  Cognition Arousal/Alertness: Awake/alert Behavior During Therapy: WFL for tasks assessed/performed Overall Cognitive Status: Within Functional Limits for tasks assessed                                          General Comments General comments (skin integrity, edema, etc.): pt denies any recent falls    Exercises     Assessment/Plan    PT Assessment Patient does not need any further PT services  PT Problem List         PT Treatment Interventions      PT Goals (Current goals can be found in the Care Plan section)  Acute Rehab PT Goals PT Goal Formulation: All assessment and education complete, DC therapy    Frequency       Co-evaluation               AM-PAC PT "6 Clicks" Mobility  Outcome Measure Help needed turning from your back to your side while in a flat bed without using bedrails?: None Help needed moving from lying on your back to sitting on the side of a flat bed without using bedrails?: None Help needed moving to and from a bed to a chair (including a wheelchair)?: A Little Help needed standing up from a chair using your arms (e.g., wheelchair or bedside chair)?: A Little Help needed to walk in hospital room?: A Little Help needed climbing 3-5 steps with a railing? : A Little 6 Click Score: 20    End of Session   Activity Tolerance: Patient tolerated treatment well Patient left: in bed;with call bell/phone within reach;with bed alarm set Nurse Communication: Mobility status PT Visit Diagnosis: Difficulty in walking, not elsewhere classified (R26.2)    Time: 2751-7001 PT Time Calculation (min) (ACUTE ONLY): 14 min   Charges:   PT Evaluation $PT Eval Low Complexity: 1 Low         Kati PT, DPT Physical Therapist Acute Rehabilitation Services Preferred contact method: Secure Chat Weekend Pager Only: [phone] Office: [phone] i  Myrtis Hopping [place] 02/16/2022, 11:16 AM

## 2022-02-16 NOTE — Discharge Summary (Signed)
Physician Discharge Summary   Patient: Nathan [name] MRN: [id number] DOB: 08/17/1945  Admit date:     02/12/2022  Discharge date: 02/16/22  Discharge Physician: Oswald Hillock   PCP: [name], MD   Recommendations at discharge:   Follow-up urology in 1 week Patient be discharged on both suprapubic catheter and ureteral catheter; urology will follow-up in 1 week and remove the urethral catheter.  Discharge Diagnoses: Principal Problem:   Gross hematuria Active Problems:   Fever   Paroxysmal atrial fibrillation (HCC)   Type 2 diabetes mellitus without complication, without long-term current use of insulin (HCC)   Essential hypertension   Malignant neoplasm of prostate (HCC)   History of DVT (deep vein thrombosis)  Resolved Problems:   * No resolved hospital problems. *  Hospital Course:   76 year old male with past medical history of hypertension, paroxysmal atrial fibrillation not on anticoagulation, left lower extremity DVT, managed with IVC filter, diabetes mellitus type 2, prostate cancer s/p radioactive seed implant s/p suprapubic catheter placement complicated by chronic hematuria.  Came to hospital with several days of hematuria. Of note, presented July 13th with severe anemia of 4.9 due to persisting hematuria.  Managed with administration of 4 units of packed red blood cells.  Patient underwent continuous bladder irrigation with cystoscopy on 7/29 during which an oozing blood vessel was fulgurated by Dr. Louis Meckel.  Patient was eventually discharged on 7/30. In the ED urology was consulted, patient's suprapubic catheter was exchanged with 22 French three-way catheter and began continuous bladder irrigation  Assessment and Plan:  Gross hematuria -Patient presented with ongoing gross hematuria with clots. -Urology consulted, patient started on continuous bladder irrigation -Urine is clearing, minimal CBI which has been titrated off per urology -Urology plans to  continue with indwelling Foley catheter and then follow-up in 1 week and then remove in the office once urine remains clear.   Acute blood loss anemia -Heme when stable at 8.0     Fever -Resolved -Patient started on IV ceftriaxone -Urine culture obtained from 02/12/2022 showed insignificant growth -IV ceftriaxone was discontinued    Paroxysmal atrial fibrillation -Heart rate is controlled -Continue metoprolol -Patient is not on anticoagulation since February as he was deemed not a good candidate for anticoagulation due to chronic hematuria      Malignant neoplasm of prostate ([place]) Stage IIb disease Complicated history of prostate cancer status post radioactive seed implants and suprapubic catheter placement Outpatient follow-up with [facility]       Consultants: Urology Procedures performed:  Disposition: Home Diet recommendation:  Discharge Diet Orders (From admission, onward)     Start     Ordered   02/16/22 0000  Diet - low sodium heart healthy        02/16/22 1138           Regular diet DISCHARGE MEDICATION: Allergies as of 02/16/2022   No Known Allergies      Medication List     STOP taking these medications    lisinopril 40 MG tablet Commonly known as: ZESTRIL       TAKE these medications    acetic acid 0.25 % irrigation Apply 1 Application topically every other day. Intravesical use   cyanocobalamin 1000 MCG tablet Commonly known as: VITAMIN B12 Take 1,000 mcg by mouth daily.   ergocalciferol 1.25 MG (50000 UT) capsule Commonly known as: VITAMIN D2 Take 50,000 Units by mouth once a week.   finasteride 5 MG tablet Commonly known as: PROSCAR Take 5  mg by mouth daily.   folic acid 1 MG tablet Commonly known as: FOLVITE Take 1 mg by mouth daily.   gabapentin 300 MG capsule Commonly known as: NEURONTIN Take 300 mg by mouth 3 (three) times daily.   magnesium oxide 400 MG tablet Commonly known as: MAG-OX Take 400 mg by mouth  daily.   metoprolol succinate 100 MG 24 hr tablet Commonly known as: TOPROL-XL Take 100 mg by mouth daily.   oxycodone 5 MG capsule Commonly known as: OXY-IR Take 5 mg by mouth every 6 (six) hours as needed for pain.   prenatal multivitamin Tabs tablet Take 1 tablet by mouth daily at 12 noon.        Follow-up Information     Raynelle Bring, MD Follow up in 1 week(s).   Specialty: Urology Contact information: [place] [address] [phone]         [name], MD Follow up in 2 week(s).   Specialty: Family Medicine Contact information: [place] [address] [phone]         Pixie Casino, MD .   Specialty: Cardiology Contact information: [address] [place] [address] [phone]                Discharge Exam: Danley Danker Weights   02/12/22 2012  Weight: 87.1 kg   General-appears in no acute distress Heart-S1-S2, regular, no murmur auscultated Lungs-clear to auscultation bilaterally, no wheezing or crackles auscultated Abdomen-soft, nontender, no organomegaly Extremities-no edema in the lower extremities Neuro-alert, oriented x3, no focal deficit noted  Condition at discharge: good  The results of significant diagnostics from this hospitalization (including imaging, microbiology, ancillary and laboratory) are listed below for reference.   Imaging Studies: DG Chest 1 View  Result Date: 02/13/2022 CLINICAL DATA:  76 year old male with increased hematuria. EXAM: CHEST  1 VIEW COMPARISON:  Portable chest 01/24/2022 and earlier. FINDINGS: Portable AP semi upright view at 0643 hours. Lung volumes and mediastinal contours are stable. Mild tortuosity of the thoracic aorta. Visualized tracheal air column is within normal limits. Allowing for portable technique the lungs are clear. No pneumothorax or pleural effusion. No acute osseous abnormality identified. IMPRESSION: Negative  portable chest. Electronically Signed   By: Genevie Ann M.D.   On: 02/13/2022 07:35   VAS [place] IVC/ILIAC (VENOUS ONLY)  Result Date: 01/30/2022 IVC/ILIAC STUDY Patient Name:  LARONN DEVONSHIRE  Date of Exam:   01/28/2022 Medical Rec #: [id number]       Accession #:    [id number] Date of Birth: 1946/06/03        Patient Gender: M Patient Age:   26 years Exam Location:  [facility] Procedure:      VAS [place] IVC/ILIAC (VENOUS ONLY) Referring Phys: Everlene Farrier HONGALGI --------------------------------------------------------------------------------  Indications: Positive lower extremity venous study with equivocal right external              iliac Other Factors: Prior left lower extremity venous study positive for DVT on                12-09-2021. IVC filter placed in the setting of bleeding/anemia.                Anticoagulation contraindicated. Limitations: Air/bowel gas.  Comparison Study: Prior bilateral lower extremity venous performed 01-28-2022                   was positive for extensive DVT involving the  right lower                   extremity and extending beyond the inguinal ligament. Performing Technologist: Darlin Coco RDMS, RVT  Examination Guidelines: A complete evaluation includes B-mode imaging, spectral Doppler, color Doppler, and power Doppler as needed of all accessible portions of each vessel. Bilateral testing is considered an integral part of a complete examination. Limited examinations for reoccurring indications may be performed as noted.  IVC/Iliac Findings: +----------+------+--------+---------------------------------------------------+    IVC    PatentThrombus                     Comments                       +----------+------+--------+---------------------------------------------------+ IVC Prox  patent                                                            +----------+------+--------+---------------------------------------------------+ IVC Mid          acute  IVC filter- near  occlusive thrombus with minimal                            flow observed                                       +----------+------+--------+---------------------------------------------------+ IVC Distal       acute                                                      +----------+------+--------+---------------------------------------------------+  +---------------+---------+-----------+---------+-----------+-----------------+       CIV      RT-PatentRT-ThrombusLT-PatentLT-Thrombus    Comments      +---------------+---------+-----------+---------+-----------+-----------------+ Common Iliac               acute    patent              Continuous flow  Prox                                                    throughout left                                                                CIV        +---------------+---------+-----------+---------+-----------+-----------------+ Common Iliac               acute    patent                               Mid                                                                      +---------------+---------+-----------+---------+-----------+-----------------+  Common Iliac               acute    patent                               Distal                                                                   +---------------+---------+-----------+---------+-----------+-----------------+  +----------------+---------+-----------+---------+-----------+----------------+       EIV       RT-PatentRT-ThrombusLT-PatentLT-Thrombus    Comments     +----------------+---------+-----------+---------+-----------+----------------+ External Iliac              acute    patent             Continuous flow  Vein Prox                                               throughout left                                                                EIV         +----------------+---------+-----------+---------+-----------+----------------+ External Iliac              acute    patent                              Vein Mid                                                                 +----------------+---------+-----------+---------+-----------+----------------+ External Iliac              acute    patent                              Vein Distal                                                              +----------------+---------+-----------+---------+-----------+----------------+   Summary: IVC/Iliac: There is evidence of acute thrombus involving the IVC. There is no evidence of thrombus involving the left common iliac vein. There is evidence of acute thrombus involving the right common iliac vein. There is no evidence of thrombus involving  the left external iliac vein. There is evidence of acute thrombus involving the right external iliac vein.  *See table(s) above for measurements and  observations.  Electronically signed by Orlie Pollen on 01/30/2022 at 12:43:10 PM.    Final    VAS [place] LOWER EXTREMITY VENOUS (DVT)  Result Date: 01/30/2022  Lower Venous DVT Study Patient Name:  DERRILL BAGNELL  Date of Exam:   01/28/2022 Medical Rec #: [id number]       Accession #:    [id number] Date of Birth: 1945/09/12        Patient Gender: M Patient Age:   74 years Exam Location:  [facility] Procedure:      VAS [place] LOWER EXTREMITY VENOUS (DVT) Referring Phys: ANAND HONGALGI --------------------------------------------------------------------------------  Indications: History of LLE DVT. New, asymmetrical swelling of RLE.  Anticoagulation: Contraindicated. IVC filter in place. Comparison Study: 12-09-2021 Prior left lower extremity venous was positive for                   acute DVT. Performing Technologist: Darlin Coco RDMS, RVT  Examination Guidelines: A complete evaluation includes B-mode imaging, spectral Doppler, color Doppler, and power  Doppler as needed of all accessible portions of each vessel. Bilateral testing is considered an integral part of a complete examination. Limited examinations for reoccurring indications may be performed as noted. The reflux portion of the exam is performed with the patient in reverse Trendelenburg.  +---------+---------------+---------+-----------+----------+--------------+ RIGHT    CompressibilityPhasicitySpontaneityPropertiesThrombus Aging +---------+---------------+---------+-----------+----------+--------------+ CFV      None           No       No                   Acute          +---------+---------------+---------+-----------+----------+--------------+ SFJ      None           No       No                   Acute          +---------+---------------+---------+-----------+----------+--------------+ FV Prox  None           No       No                   Acute          +---------+---------------+---------+-----------+----------+--------------+ FV Mid   None           No       No                   Acute          +---------+---------------+---------+-----------+----------+--------------+ FV DistalNone           No       No                   Acute          +---------+---------------+---------+-----------+----------+--------------+ PFV      None           No       No                   Acute          +---------+---------------+---------+-----------+----------+--------------+ POP      None           No       No                   Acute          +---------+---------------+---------+-----------+----------+--------------+ PTV  Partial        Yes      Yes                  Acute          +---------+---------------+---------+-----------+----------+--------------+ PERO     Partial        Yes      Yes                  Acute          +---------+---------------+---------+-----------+----------+--------------+ Gastroc  None           No       No                    Acute          +---------+---------------+---------+-----------+----------+--------------+   +---------+---------------+---------+-----------+----------+--------------+ LEFT     CompressibilityPhasicitySpontaneityPropertiesThrombus Aging +---------+---------------+---------+-----------+----------+--------------+ CFV      Full           No       Yes                                 +---------+---------------+---------+-----------+----------+--------------+ SFJ      Full                                                        +---------+---------------+---------+-----------+----------+--------------+ FV Prox  Full                                                        +---------+---------------+---------+-----------+----------+--------------+ FV Mid   Full                                                        +---------+---------------+---------+-----------+----------+--------------+ FV DistalNone           No       No                   Acute          +---------+---------------+---------+-----------+----------+--------------+ PFV      Full                                                        +---------+---------------+---------+-----------+----------+--------------+ POP      None           No       No                   Acute          +---------+---------------+---------+-----------+----------+--------------+ PTV      None           No       No  Acute          +---------+---------------+---------+-----------+----------+--------------+ PERO     None           No       No                   Acute          +---------+---------------+---------+-----------+----------+--------------+ Gastroc  None           No       No                   Acute          +---------+---------------+---------+-----------+----------+--------------+     Summary: RIGHT: - Findings consistent with acute deep vein thrombosis involving the right common  femoral vein, SF junction, right femoral vein, right proximal profunda vein, right popliteal vein, right posterior tibial veins, right peroneal veins, and right gastrocnemius veins. - No cystic structure found in the popliteal fossa. - Possible obstruction proximal to the inguinal ligament.  LEFT: - Findings consistent with acute deep vein thrombosis involving the left femoral vein, left popliteal vein, left posterior tibial veins, left peroneal veins, and left gastrocnemius veins. - No cystic structure found in the popliteal fossa. - Continuous flow noted in the left lower extremity.  *See table(s) above for measurements and observations. Electronically signed by Orlie Pollen on 01/30/2022 at 12:42:22 PM.    Final    US RENAL  Result Date: 01/24/2022 CLINICAL DATA:  Acute kidney injury EXAM: RENAL / URINARY TRACT ULTRASOUND COMPLETE COMPARISON:  CT scan 01/13/2022 FINDINGS: Right Kidney: Renal measurements: 11.0 by 5.5 by 6.0 cm = volume: 189 mL. 3.7 by 3.3 by 3.1 cm cyst of the right kidney upper pole, subtle internal echoes indicating minimal complexity, corresponding with Bosniak category 2. Renal echogenicity in the parenchyma is similar to that of the adjacent liver. Left Kidney: Renal measurements: 10.1 by 5.9 by 5.9 cm = volume: 182 mL. Left kidney upper pole 2.2 by 2.2 by 2.5 cm minimally complex renal cyst, Bosniak category 2. Mild left hydronephrosis. Bladder: The bladder is collapsed around the Foley catheter. Known prostatomegaly. Other: None. IMPRESSION: 1. Mild left hydronephrosis. 2. Benign-appearing bilateral renal cysts. 3. Prostatomegaly.  Foley catheter in the urinary bladder. Electronically Signed   By: Van Clines M.D.   On: 01/24/2022 12:52   DG CHEST PORT 1 VIEW  Result Date: 01/24/2022 CLINICAL DATA:  Fever EXAM: PORTABLE CHEST 1 VIEW COMPARISON:  Chest x-ray dated December 09, 2021 FINDINGS: The heart size and mediastinal contours are within normal limits. Both lungs are clear.  The visualized skeletal structures are unremarkable. IMPRESSION: No evidence of pneumonia. Electronically Signed   By: Yetta Glassman M.D.   On: 01/24/2022 09:44   IR Angiogram Pelvis Selective Or Supraselective  Result Date: 01/18/2022 INDICATION: Briefly, 76 year old male comorbid with history of prostate cancer s/p brachytherapy and AFib previously on anticoagulation who presented with gross hematuria refractory to urologic intervention. EXAM: Procedures: 1. PROSTATE ARTERY EMBOLIZATION, LEFT 2. PELVIC ARTERIOGRAPHY and CONE BEAM CT ANGIOGRAPHY of the PELVIS MEDICATIONS: Rocephin 2 gm IV. The antibiotic was administered within one hour of the procedure ANESTHESIA/SEDATION: Moderate (conscious) sedation was employed during this procedure. A total of Versed 4.5 mg and Fentanyl 300 mcg was administered intravenously. Moderate Sedation Time: 154 minutes. The patient's level of consciousness and vital signs were monitored continuously by radiology nursing throughout the procedure under my direct supervision. CONTRAST:  57m OMNIPAQUE IOHEXOL 300 MG/ML SOLN, 233m  OMNIPAQUE IOHEXOL 300 MG/ML SOLN, 51m OMNIPAQUE IOHEXOL 300 MG/ML SOLN, 57mOMNIPAQUE IOHEXOL 300 MG/ML SOLN FLUOROSCOPY TIME:  Fluoroscopic dose; 76992Gy COMPLICATIONS: None immediate. PROCEDURE: Informed consent was obtained from the the patient and/or patient's representative following explanation of the procedure, risks, benefits and alternatives. The patient understands, agrees and consents for the procedure. All questions were addressed. A time out was performed prior to the initiation of the procedure. Maximal barrier sterile technique utilized including caps, mask, sterile gowns, sterile gloves, large sterile drape, hand hygiene, and Betadine prep. The RIGHT femoral head was marked fluoroscopically. Under sterile conditions and local anesthesia, the RIGHT common femoral artery access was performed with a micropuncture needle. Under direct  ultrasound guidance, the right common femoral was accessed with a micropuncture kit. An ultrasound image was saved for documentation purposes. This allowed for placement of a 5 Fr vascular sheath. A limited arteriogram was performed through the side arm of the sheath confirming appropriate access within the RIGHT common femoral artery. The 5 Fr C2 catheter was utilized to select the contralateral LEFT internal iliac artery then cone beam CT angiogram was performed, with post processing performed at a separate workstation. Additionally, selective LEFT prostatic arteriogram was performed followed by catheterization with a 1.9 Fr 165 cm Progreat lambda microcatheter and 0.016 inch Fathom micro guide wire. Access was adequate for embolization. Embolization was performed with 100-300 um and 300-500 um embosphere micro particles followed by Gelfoam slurry. Active extravasation was noted from the LEFT prostate into the urinary bladder. Post embolization angiogram confirms stasis of the LEFT prostatic vascular territory. The 5 Fr catheter was retracted and using a Waltman loop was inserted into the ipsilateral/RIGHT internal iliac artery then cone beam CT angiogram was performed. Additionally selective RIGHT prostatic arteriogram was also performed followed by catheterization using microcatheter and microwire. A common origin of the RIGHT obturator artery and prostatic artery was present with a challenging posteromedial angle of origin. The common origin of the obturator and prostatic artery ended up with vasospasm and therefore we could not cannulate the RIGHT prostatic artery for safe embolization. The microcatheter and base catheter were retracted. Embolization was not performed on this side. CONE BEAM CT ANGIOGRAPHY: To utilize vascular tracking software, a cone beam CTA was then performed from the LEFT and RIGHT internal iliac arteries, and post processing was performed at a separate workstation. PURPOSE OF THE  ARTERIOGRAMS: No previous catheter-directed angiogram was available. Therefore a new complete diagnostic angiography was performed. The decision to proceed with an interventional procedure was made based on this new diagnostic angiogram. ARTERIAL CLOSURE: At this point, all wires, catheters and sheaths were removed from the patient. The sheath and catheter were removed and hemostasis was achieved by Angio-Seal closure of the RIGHT common femoral artery access. The puncture site was be dressed in a sterile manner. The patient tolerated the procedure well without immediate post procedural complication. FINDINGS: - access via the RIGHT common femoral artery. - Prostatic arteries arising from the anterior division, gluteo-pudendal trunk (Type II) on the LEFT and off the obturator artery on the RIGHT (Type III) - Active extravasation into urinary bladder on LEFT prostate artery arteriogram, consistent with hematuria - Successful microparticle prostate artery embolization (PAE) and Gelfoam slurry, with cross filling of the RIGHT prostatic gland. - Vasospasm of the shared origin of RIGHT obturator artery, unable to embolize via the RIGHT prostatic artery - AngioSeal closure at the RIGHT groin with distal RLE pulses at the end of the case. IMPRESSION: 1.  Successful LEFT prostatic artery microparticle embolization for refractory hematuria via trans femoral approach. 2. 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 Embolization was not performed on this side. PLAN: Continuous bladder irrigation (CBI) per Urology and continue to monitor for hematuria with serial H/h. Michaelle Birks, MD [facility] Electronically Signed   By: Michaelle Birks M.D.   On: 01/18/2022 08:59   IR US Guide Vasc Access Right  Result Date: 01/18/2022 INDICATION: Briefly, 76 year old male comorbid with history of prostate cancer s/p brachytherapy and AFib  previously on anticoagulation who presented with gross hematuria refractory to urologic intervention. EXAM: Procedures: 1. PROSTATE ARTERY EMBOLIZATION, LEFT 2. PELVIC ARTERIOGRAPHY and CONE BEAM CT ANGIOGRAPHY of the PELVIS MEDICATIONS: Rocephin 2 gm IV. The antibiotic was administered within one hour of the procedure ANESTHESIA/SEDATION: Moderate (conscious) sedation was employed during this procedure. A total of Versed 4.5 mg and Fentanyl 300 mcg was administered intravenously. Moderate Sedation Time: 154 minutes. The patient's level of consciousness and vital signs were monitored continuously by radiology nursing throughout the procedure under my direct supervision. CONTRAST:  48m OMNIPAQUE IOHEXOL 300 MG/ML SOLN, 268mOMNIPAQUE IOHEXOL 300 MG/ML SOLN, 2612mMNIPAQUE IOHEXOL 300 MG/ML SOLN, 62m39mNIPAQUE IOHEXOL 300 MG/ML SOLN FLUOROSCOPY TIME:  Fluoroscopic dose; 764 195 COMPLICATIONS: None immediate. PROCEDURE: Informed consent was obtained from the the patient and/or patient's representative following explanation of the procedure, risks, benefits and alternatives. The patient understands, agrees and consents for the procedure. All questions were addressed. A time out was performed prior to the initiation of the procedure. Maximal barrier sterile technique utilized including caps, mask, sterile gowns, sterile gloves, large sterile drape, hand hygiene, and Betadine prep. The RIGHT femoral head was marked fluoroscopically. Under sterile conditions and local anesthesia, the RIGHT common femoral artery access was performed with a micropuncture needle. Under direct ultrasound guidance, the right common femoral was accessed with a micropuncture kit. An ultrasound image was saved for documentation purposes. This allowed for placement of a 5 Fr vascular sheath. A limited arteriogram was performed through the side arm of the sheath confirming appropriate access within the RIGHT common femoral artery. The 5 Fr C2  catheter was utilized to select the contralateral LEFT internal iliac artery then cone beam CT angiogram was performed, with post processing performed at a separate workstation. Additionally, selective LEFT prostatic arteriogram was performed followed by catheterization with a 1.9 Fr 165 cm Progreat lambda microcatheter and 0.016 inch Fathom micro guide wire. Access was adequate for embolization. Embolization was performed with 100-300 um and 300-500 um embosphere micro particles followed by Gelfoam slurry. Active extravasation was noted from the LEFT prostate into the urinary bladder. Post embolization angiogram confirms stasis of the LEFT prostatic vascular territory. The 5 Fr catheter was retracted and using a Waltman loop was inserted into the ipsilateral/RIGHT internal iliac artery then cone beam CT angiogram was performed. Additionally selective RIGHT prostatic arteriogram was also performed followed by catheterization using microcatheter and microwire. A common origin of the RIGHT obturator artery and prostatic artery was present with a challenging posteromedial angle of origin. The common origin of the obturator and prostatic artery ended up with vasospasm and therefore we could not cannulate the RIGHT prostatic artery for safe embolization. The microcatheter and base catheter were retracted. Embolization was not performed on this side. CONE BEAM CT ANGIOGRAPHY: To utilize vascular tracking software, a cone beam CTA was then performed from the LEFT and RIGHT internal iliac  arteries, and post processing was performed at a separate workstation. PURPOSE OF THE ARTERIOGRAMS: No previous catheter-directed angiogram was available. Therefore a new complete diagnostic angiography was performed. The decision to proceed with an interventional procedure was made based on this new diagnostic angiogram. ARTERIAL CLOSURE: At this point, all wires, catheters and sheaths were removed from the patient. The sheath and  catheter were removed and hemostasis was achieved by Angio-Seal closure of the RIGHT common femoral artery access. The puncture site was be dressed in a sterile manner. The patient tolerated the procedure well without immediate post procedural complication. FINDINGS: - access via the RIGHT common femoral artery. - Prostatic arteries arising from the anterior division, gluteo-pudendal trunk (Type II) on the LEFT and off the obturator artery on the RIGHT (Type III) - Active extravasation into urinary bladder on LEFT prostate artery arteriogram, consistent with hematuria - Successful microparticle prostate artery embolization (PAE) and Gelfoam slurry, with cross filling of the RIGHT prostatic gland. - Vasospasm of the shared origin of RIGHT obturator artery, unable to embolize via the RIGHT prostatic artery - AngioSeal closure at the RIGHT groin with distal RLE pulses at the end of the case. IMPRESSION: 1. Successful LEFT prostatic artery microparticle embolization for refractory hematuria via trans femoral approach. 2. 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 Embolization was not performed on this side. PLAN: Continuous bladder irrigation (CBI) per Urology and continue to monitor for hematuria with serial H/h. Michaelle Birks, MD [facility] Radiology Electronically Signed   By: Michaelle Birks M.D.   On: 01/18/2022 08:59   IR 3D Independent Darreld Mclean  Result Date: 01/18/2022 INDICATION: Briefly, 76 year old male comorbid with history of prostate cancer s/p brachytherapy and AFib previously on anticoagulation who presented with gross hematuria refractory to urologic intervention. EXAM: Procedures: 1. PROSTATE ARTERY EMBOLIZATION, LEFT 2. PELVIC ARTERIOGRAPHY and CONE BEAM CT ANGIOGRAPHY of the PELVIS MEDICATIONS: Rocephin 2 gm IV. The antibiotic was administered within one hour of the procedure ANESTHESIA/SEDATION: Moderate  (conscious) sedation was employed during this procedure. A total of Versed 4.5 mg and Fentanyl 300 mcg was administered intravenously. Moderate Sedation Time: 154 minutes. The patient's level of consciousness and vital signs were monitored continuously by radiology nursing throughout the procedure under my direct supervision. CONTRAST:  57m OMNIPAQUE IOHEXOL 300 MG/ML SOLN, 267mOMNIPAQUE IOHEXOL 300 MG/ML SOLN, 2660mMNIPAQUE IOHEXOL 300 MG/ML SOLN, 55m27mNIPAQUE IOHEXOL 300 MG/ML SOLN FLUOROSCOPY TIME:  Fluoroscopic dose; 764 016 COMPLICATIONS: None immediate. PROCEDURE: Informed consent was obtained from the the patient and/or patient's representative following explanation of the procedure, risks, benefits and alternatives. The patient understands, agrees and consents for the procedure. All questions were addressed. A time out was performed prior to the initiation of the procedure. Maximal barrier sterile technique utilized including caps, mask, sterile gowns, sterile gloves, large sterile drape, hand hygiene, and Betadine prep. The RIGHT femoral head was marked fluoroscopically. Under sterile conditions and local anesthesia, the RIGHT common femoral artery access was performed with a micropuncture needle. Under direct ultrasound guidance, the right common femoral was accessed with a micropuncture kit. An ultrasound image was saved for documentation purposes. This allowed for placement of a 5 Fr vascular sheath. A limited arteriogram was performed through the side arm of the sheath confirming appropriate access within the RIGHT common femoral artery. The 5 Fr C2 catheter was utilized to select the contralateral LEFT internal iliac artery then cone beam CT angiogram was performed, with  post processing performed at a separate workstation. Additionally, selective LEFT prostatic arteriogram was performed followed by catheterization with a 1.9 Fr 165 cm Progreat lambda microcatheter and 0.016 inch Fathom micro guide  wire. Access was adequate for embolization. Embolization was performed with 100-300 um and 300-500 um embosphere micro particles followed by Gelfoam slurry. Active extravasation was noted from the LEFT prostate into the urinary bladder. Post embolization angiogram confirms stasis of the LEFT prostatic vascular territory. The 5 Fr catheter was retracted and using a Waltman loop was inserted into the ipsilateral/RIGHT internal iliac artery then cone beam CT angiogram was performed. Additionally selective RIGHT prostatic arteriogram was also performed followed by catheterization using microcatheter and microwire. A common origin of the RIGHT obturator artery and prostatic artery was present with a challenging posteromedial angle of origin. The common origin of the obturator and prostatic artery ended up with vasospasm and therefore we could not cannulate the RIGHT prostatic artery for safe embolization. The microcatheter and base catheter were retracted. Embolization was not performed on this side. CONE BEAM CT ANGIOGRAPHY: To utilize vascular tracking software, a cone beam CTA was then performed from the LEFT and RIGHT internal iliac arteries, and post processing was performed at a separate workstation. PURPOSE OF THE ARTERIOGRAMS: No previous catheter-directed angiogram was available. Therefore a new complete diagnostic angiography was performed. The decision to proceed with an interventional procedure was made based on this new diagnostic angiogram. ARTERIAL CLOSURE: At this point, all wires, catheters and sheaths were removed from the patient. The sheath and catheter were removed and hemostasis was achieved by Angio-Seal closure of the RIGHT common femoral artery access. The puncture site was be dressed in a sterile manner. The patient tolerated the procedure well without immediate post procedural complication. FINDINGS: - access via the RIGHT common femoral artery. - Prostatic arteries arising from the anterior  division, gluteo-pudendal trunk (Type II) on the LEFT and off the obturator artery on the RIGHT (Type III) - Active extravasation into urinary bladder on LEFT prostate artery arteriogram, consistent with hematuria - Successful microparticle prostate artery embolization (PAE) and Gelfoam slurry, with cross filling of the RIGHT prostatic gland. - Vasospasm of the shared origin of RIGHT obturator artery, unable to embolize via the RIGHT prostatic artery - AngioSeal closure at the RIGHT groin with distal RLE pulses at the end of the case. IMPRESSION: 1. Successful LEFT prostatic artery microparticle embolization for refractory hematuria via trans femoral approach. 2. 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 Embolization was not performed on this side. PLAN: Continuous bladder irrigation (CBI) per Urology and continue to monitor for hematuria with serial H/h. Michaelle Birks, MD [facility] Radiology Electronically Signed   By: Michaelle Birks M.D.   On: 01/18/2022 08:59   IR Angiogram Selective Each Additional Vessel  Result Date: 01/18/2022 INDICATION: Briefly, 76 year old male comorbid with history of prostate cancer s/p brachytherapy and AFib previously on anticoagulation who presented with gross hematuria refractory to urologic intervention. EXAM: Procedures: 1. PROSTATE ARTERY EMBOLIZATION, LEFT 2. PELVIC ARTERIOGRAPHY and CONE BEAM CT ANGIOGRAPHY of the PELVIS MEDICATIONS: Rocephin 2 gm IV. The antibiotic was administered within one hour of the procedure ANESTHESIA/SEDATION: Moderate (conscious) sedation was employed during this procedure. A total of Versed 4.5 mg and Fentanyl 300 mcg was administered intravenously. Moderate Sedation Time: 154 minutes. The patient's level of consciousness and vital signs were monitored continuously by radiology nursing throughout the procedure under my direct supervision.  CONTRAST:  23m  OMNIPAQUE IOHEXOL 300 MG/ML SOLN, 280mOMNIPAQUE IOHEXOL 300 MG/ML SOLN, 2666mMNIPAQUE IOHEXOL 300 MG/ML SOLN, 69m78mNIPAQUE IOHEXOL 300 MG/ML SOLN FLUOROSCOPY TIME:  Fluoroscopic dose; 764 154 COMPLICATIONS: None immediate. PROCEDURE: Informed consent was obtained from the the patient and/or patient's representative following explanation of the procedure, risks, benefits and alternatives. The patient understands, agrees and consents for the procedure. All questions were addressed. A time out was performed prior to the initiation of the procedure. Maximal barrier sterile technique utilized including caps, mask, sterile gowns, sterile gloves, large sterile drape, hand hygiene, and Betadine prep. The RIGHT femoral head was marked fluoroscopically. Under sterile conditions and local anesthesia, the RIGHT common femoral artery access was performed with a micropuncture needle. Under direct ultrasound guidance, the right common femoral was accessed with a micropuncture kit. An ultrasound image was saved for documentation purposes. This allowed for placement of a 5 Fr vascular sheath. A limited arteriogram was performed through the side arm of the sheath confirming appropriate access within the RIGHT common femoral artery. The 5 Fr C2 catheter was utilized to select the contralateral LEFT internal iliac artery then cone beam CT angiogram was performed, with post processing performed at a separate workstation. Additionally, selective LEFT prostatic arteriogram was performed followed by catheterization with a 1.9 Fr 165 cm Progreat lambda microcatheter and 0.016 inch Fathom micro guide wire. Access was adequate for embolization. Embolization was performed with 100-300 um and 300-500 um embosphere micro particles followed by Gelfoam slurry. Active extravasation was noted from the LEFT prostate into the urinary bladder. Post embolization angiogram confirms stasis of the LEFT prostatic vascular territory. The 5 Fr catheter  was retracted and using a Waltman loop was inserted into the ipsilateral/RIGHT internal iliac artery then cone beam CT angiogram was performed. Additionally selective RIGHT prostatic arteriogram was also performed followed by catheterization using microcatheter and microwire. A common origin of the RIGHT obturator artery and prostatic artery was present with a challenging posteromedial angle of origin. The common origin of the obturator and prostatic artery ended up with vasospasm and therefore we could not cannulate the RIGHT prostatic artery for safe embolization. The microcatheter and base catheter were retracted. Embolization was not performed on this side. CONE BEAM CT ANGIOGRAPHY: To utilize vascular tracking software, a cone beam CTA was then performed from the LEFT and RIGHT internal iliac arteries, and post processing was performed at a separate workstation. PURPOSE OF THE ARTERIOGRAMS: No previous catheter-directed angiogram was available. Therefore a new complete diagnostic angiography was performed. The decision to proceed with an interventional procedure was made based on this new diagnostic angiogram. ARTERIAL CLOSURE: At this point, all wires, catheters and sheaths were removed from the patient. The sheath and catheter were removed and hemostasis was achieved by Angio-Seal closure of the RIGHT common femoral artery access. The puncture site was be dressed in a sterile manner. The patient tolerated the procedure well without immediate post procedural complication. FINDINGS: - access via the RIGHT common femoral artery. - Prostatic arteries arising from the anterior division, gluteo-pudendal trunk (Type II) on the LEFT and off the obturator artery on the RIGHT (Type III) - Active extravasation into urinary bladder on LEFT prostate artery arteriogram, consistent with hematuria - Successful microparticle prostate artery embolization (PAE) and Gelfoam slurry, with cross filling of the RIGHT prostatic  gland. - Vasospasm of the shared origin of RIGHT obturator artery, unable to embolize via the RIGHT prostatic artery - AngioSeal closure at the RIGHT groin with distal  RLE pulses at the end of the case. IMPRESSION: 1. Successful LEFT prostatic artery microparticle embolization for refractory hematuria via trans femoral approach. 2. 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 Embolization was not performed on this side. PLAN: Continuous bladder irrigation (CBI) per Urology and continue to monitor for hematuria with serial H/h. Michaelle Birks, MD [facility] Radiology Electronically Signed   By: Michaelle Birks M.D.   On: 01/18/2022 08:59   IR Angiogram Selective Each Additional Vessel  Result Date: 01/18/2022 INDICATION: Briefly, 76 year old male comorbid with history of prostate cancer s/p brachytherapy and AFib previously on anticoagulation who presented with gross hematuria refractory to urologic intervention. EXAM: Procedures: 1. PROSTATE ARTERY EMBOLIZATION, LEFT 2. PELVIC ARTERIOGRAPHY and CONE BEAM CT ANGIOGRAPHY of the PELVIS MEDICATIONS: Rocephin 2 gm IV. The antibiotic was administered within one hour of the procedure ANESTHESIA/SEDATION: Moderate (conscious) sedation was employed during this procedure. A total of Versed 4.5 mg and Fentanyl 300 mcg was administered intravenously. Moderate Sedation Time: 154 minutes. The patient's level of consciousness and vital signs were monitored continuously by radiology nursing throughout the procedure under my direct supervision. CONTRAST:  38m OMNIPAQUE IOHEXOL 300 MG/ML SOLN, 243mOMNIPAQUE IOHEXOL 300 MG/ML SOLN, 2621mMNIPAQUE IOHEXOL 300 MG/ML SOLN, 15m35mNIPAQUE IOHEXOL 300 MG/ML SOLN FLUOROSCOPY TIME:  Fluoroscopic dose; 764 381 COMPLICATIONS: None immediate. PROCEDURE: Informed consent was obtained from the the patient and/or patient's representative following  explanation of the procedure, risks, benefits and alternatives. The patient understands, agrees and consents for the procedure. All questions were addressed. A time out was performed prior to the initiation of the procedure. Maximal barrier sterile technique utilized including caps, mask, sterile gowns, sterile gloves, large sterile drape, hand hygiene, and Betadine prep. The RIGHT femoral head was marked fluoroscopically. Under sterile conditions and local anesthesia, the RIGHT common femoral artery access was performed with a micropuncture needle. Under direct ultrasound guidance, the right common femoral was accessed with a micropuncture kit. An ultrasound image was saved for documentation purposes. This allowed for placement of a 5 Fr vascular sheath. A limited arteriogram was performed through the side arm of the sheath confirming appropriate access within the RIGHT common femoral artery. The 5 Fr C2 catheter was utilized to select the contralateral LEFT internal iliac artery then cone beam CT angiogram was performed, with post processing performed at a separate workstation. Additionally, selective LEFT prostatic arteriogram was performed followed by catheterization with a 1.9 Fr 165 cm Progreat lambda microcatheter and 0.016 inch Fathom micro guide wire. Access was adequate for embolization. Embolization was performed with 100-300 um and 300-500 um embosphere micro particles followed by Gelfoam slurry. Active extravasation was noted from the LEFT prostate into the urinary bladder. Post embolization angiogram confirms stasis of the LEFT prostatic vascular territory. The 5 Fr catheter was retracted and using a Waltman loop was inserted into the ipsilateral/RIGHT internal iliac artery then cone beam CT angiogram was performed. Additionally selective RIGHT prostatic arteriogram was also performed followed by catheterization using microcatheter and microwire. A common origin of the RIGHT obturator artery and  prostatic artery was present with a challenging posteromedial angle of origin. The common origin of the obturator and prostatic artery ended up with vasospasm and therefore we could not cannulate the RIGHT prostatic artery for safe embolization. The microcatheter and base catheter were retracted. Embolization was not performed on this side. CONE BEAM CT ANGIOGRAPHY: To utilize vascular tracking software, a cone beam CTA  was then performed from the LEFT and RIGHT internal iliac arteries, and post processing was performed at a separate workstation. PURPOSE OF THE ARTERIOGRAMS: No previous catheter-directed angiogram was available. Therefore a new complete diagnostic angiography was performed. The decision to proceed with an interventional procedure was made based on this new diagnostic angiogram. ARTERIAL CLOSURE: At this point, all wires, catheters and sheaths were removed from the patient. The sheath and catheter were removed and hemostasis was achieved by Angio-Seal closure of the RIGHT common femoral artery access. The puncture site was be dressed in a sterile manner. The patient tolerated the procedure well without immediate post procedural complication. FINDINGS: - access via the RIGHT common femoral artery. - Prostatic arteries arising from the anterior division, gluteo-pudendal trunk (Type II) on the LEFT and off the obturator artery on the RIGHT (Type III) - Active extravasation into urinary bladder on LEFT prostate artery arteriogram, consistent with hematuria - Successful microparticle prostate artery embolization (PAE) and Gelfoam slurry, with cross filling of the RIGHT prostatic gland. - Vasospasm of the shared origin of RIGHT obturator artery, unable to embolize via the RIGHT prostatic artery - AngioSeal closure at the RIGHT groin with distal RLE pulses at the end of the case. IMPRESSION: 1. Successful LEFT prostatic artery microparticle embolization for refractory hematuria via trans femoral approach.  2. 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 Embolization was not performed on this side. PLAN: Continuous bladder irrigation (CBI) per Urology and continue to monitor for hematuria with serial H/h. Michaelle Birks, MD [facility] Radiology Electronically Signed   By: Michaelle Birks M.D.   On: 01/18/2022 08:59   IR EMBO ART  VEN HEMORR LYMPH EXTRAV  INC GUIDE ROADMAPPING  Result Date: 01/18/2022 INDICATION: Briefly, 76 year old male comorbid with history of prostate cancer s/p brachytherapy and AFib previously on anticoagulation who presented with gross hematuria refractory to urologic intervention. EXAM: Procedures: 1. PROSTATE ARTERY EMBOLIZATION, LEFT 2. PELVIC ARTERIOGRAPHY and CONE BEAM CT ANGIOGRAPHY of the PELVIS MEDICATIONS: Rocephin 2 gm IV. The antibiotic was administered within one hour of the procedure ANESTHESIA/SEDATION: Moderate (conscious) sedation was employed during this procedure. A total of Versed 4.5 mg and Fentanyl 300 mcg was administered intravenously. Moderate Sedation Time: 154 minutes. The patient's level of consciousness and vital signs were monitored continuously by radiology nursing throughout the procedure under my direct supervision. CONTRAST:  53m OMNIPAQUE IOHEXOL 300 MG/ML SOLN, 226mOMNIPAQUE IOHEXOL 300 MG/ML SOLN, 2639mMNIPAQUE IOHEXOL 300 MG/ML SOLN, 34m86mNIPAQUE IOHEXOL 300 MG/ML SOLN FLUOROSCOPY TIME:  Fluoroscopic dose; 764 016 COMPLICATIONS: None immediate. PROCEDURE: Informed consent was obtained from the the patient and/or patient's representative following explanation of the procedure, risks, benefits and alternatives. The patient understands, agrees and consents for the procedure. All questions were addressed. A time out was performed prior to the initiation of the procedure. Maximal barrier sterile technique utilized including caps, mask, sterile gowns, sterile gloves,  large sterile drape, hand hygiene, and Betadine prep. The RIGHT femoral head was marked fluoroscopically. Under sterile conditions and local anesthesia, the RIGHT common femoral artery access was performed with a micropuncture needle. Under direct ultrasound guidance, the right common femoral was accessed with a micropuncture kit. An ultrasound image was saved for documentation purposes. This allowed for placement of a 5 Fr vascular sheath. A limited arteriogram was performed through the side arm of the sheath confirming appropriate access within the RIGHT common femoral artery. The 5 Fr C2 catheter  was utilized to select the contralateral LEFT internal iliac artery then cone beam CT angiogram was performed, with post processing performed at a separate workstation. Additionally, selective LEFT prostatic arteriogram was performed followed by catheterization with a 1.9 Fr 165 cm Progreat lambda microcatheter and 0.016 inch Fathom micro guide wire. Access was adequate for embolization. Embolization was performed with 100-300 um and 300-500 um embosphere micro particles followed by Gelfoam slurry. Active extravasation was noted from the LEFT prostate into the urinary bladder. Post embolization angiogram confirms stasis of the LEFT prostatic vascular territory. The 5 Fr catheter was retracted and using a Waltman loop was inserted into the ipsilateral/RIGHT internal iliac artery then cone beam CT angiogram was performed. Additionally selective RIGHT prostatic arteriogram was also performed followed by catheterization using microcatheter and microwire. A common origin of the RIGHT obturator artery and prostatic artery was present with a challenging posteromedial angle of origin. The common origin of the obturator and prostatic artery ended up with vasospasm and therefore we could not cannulate the RIGHT prostatic artery for safe embolization. The microcatheter and base catheter were retracted. Embolization was not  performed on this side. [facility] BEAM CT ANGIOGRAPHY: To utilize vascular tracking software, a cone beam CTA was then performed from the LEFT and RIGHT internal iliac arteries, and post processing was performed at a separate workstation. PURPOSE OF THE ARTERIOGRAMS: No previous catheter-directed angiogram was available. Therefore a new complete diagnostic angiography was performed. The decision to proceed with an interventional procedure was made based on this new diagnostic angiogram. ARTERIAL CLOSURE: At this point, all wires, catheters and sheaths were removed from the patient. The sheath and catheter were removed and hemostasis was achieved by Angio-Seal closure of the RIGHT common femoral artery access. The puncture site was be dressed in a sterile manner. The patient tolerated the procedure well without immediate post procedural complication. FINDINGS: - access via the RIGHT common femoral artery. - Prostatic arteries arising from the anterior division, gluteo-pudendal trunk (Type II) on the LEFT and off the obturator artery on the RIGHT (Type III) - Active extravasation into urinary bladder on LEFT prostate artery arteriogram, consistent with hematuria - Successful microparticle prostate artery embolization (PAE) and Gelfoam slurry, with cross filling of the RIGHT prostatic gland. - Vasospasm of the shared origin of RIGHT obturator artery, unable to embolize via the RIGHT prostatic artery - AngioSeal closure at the RIGHT groin with distal RLE pulses at the end of the case. IMPRESSION: 1. Successful LEFT prostatic artery microparticle embolization for refractory hematuria via trans femoral approach. 2. 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 Embolization was not performed on this side. PLAN: Continuous bladder irrigation (CBI) per Urology and continue to monitor for hematuria with serial H/h. Michaelle Birks, MD [facility]  [facility] Radiology Electronically Signed   By: Michaelle Birks M.D.   On: 01/18/2022 08:59    Microbiology: Results for orders placed or performed during the hospital encounter of 02/12/22  Urine Culture     Status: Abnormal   Collection Time: 02/12/22  9:13 PM   Specimen: Urine, Catheterized  Result Value Ref Range Status   Specimen Description   Final    URINE, CATHETERIZED Performed at [facility] [address], [place], [place] [address]    Special Requests   Final    NONE Performed at [facility], [place] [address], [place], [place] [address]    Culture (A)  Final    <  10,000 COLONIES/mL INSIGNIFICANT GROWTH Performed at [facility], [place] [address] [place], [place] [address]    Report Status 02/14/2022 FINAL  Final  SARS Coronavirus 2 by RT PCR (hospital order, performed in [facility] hospital lab) *cepheid single result test* Anterior Nasal Swab     Status: None   Collection Time: 02/13/22  6:26 AM   Specimen: Anterior Nasal Swab  Result Value Ref Range Status   SARS Coronavirus 2 by RT PCR NEGATIVE NEGATIVE Final    Comment: (NOTE) SARS-CoV-2 target nucleic acids are NOT DETECTED.  The SARS-CoV-2 RNA is generally detectable in upper and lower respiratory specimens during the acute phase of infection. The lowest concentration of SARS-CoV-2 viral copies this assay can detect is 250 copies / mL. A negative result does not preclude SARS-CoV-2 infection and should not be used as the sole basis for treatment or other patient management decisions.  A negative result may occur with improper specimen collection / handling, submission of specimen other than nasopharyngeal swab, presence of viral mutation(s) within the areas targeted by this assay, and inadequate number of viral copies (<250 copies / mL). A negative result must be combined with clinical observations, patient history, and epidemiological information.  Fact Sheet for  Patients:   https://www.patel.info/  Fact Sheet for Healthcare Providers: https://hall.com/  This test is not yet approved or  cleared by the [place] FDA and has been authorized for detection and/or diagnosis of SARS-CoV-2 by FDA under an Emergency Use Authorization (EUA).  This EUA will remain in effect (meaning this test can be used) for the duration of the COVID-19 declaration under Section 564(b)(1) of the Act, 21 U.S.C. section 360bbb-3(b)(1), unless the authorization is terminated or revoked sooner.  Performed at [facility], [place] [address], [place], [place] [address]   Culture, blood (Routine X 2) w Reflex to ID Panel     Status: None (Preliminary result)   Collection Time: 02/13/22  6:53 AM   Specimen: BLOOD  Result Value Ref Range Status   Specimen Description   Final    BLOOD LEFT ANTECUBITAL Performed at [facility] [address] [place], [place] [address]    Special Requests   Final    BOTTLES DRAWN AEROBIC AND ANAEROBIC Blood Culture adequate volume Performed at [facility] [address], [place], [place] [address]    Culture   Final    NO GROWTH 3 DAYS Performed at [facility], [place] [address] [place], [place] [address]    Report Status PENDING  Incomplete  Culture, blood (Routine X 2) w Reflex to ID Panel     Status: None (Preliminary result)   Collection Time: 02/13/22  8:50 AM   Specimen: BLOOD  Result Value Ref Range Status   Specimen Description   Final    BLOOD BLOOD LEFT FOREARM Performed at [facility] [address], [place], [place] [address]    Special Requests   Final    BOTTLES DRAWN AEROBIC AND ANAEROBIC Blood Culture results may not be optimal due to an excessive volume of blood received in culture bottles Performed at [facility] [address], [place], [place] [address]    Culture    Final    NO GROWTH 3 DAYS Performed at [facility], [place] [address], [place], [place] [address]    Report Status PENDING  Incomplete    Labs: CBC: Recent Labs  Lab 02/12/22  2035 02/13/22 0850 02/13/22 1416 02/13/22 1939 02/14/22 0434 02/15/22 1214  WBC 8.8 7.6 8.0 8.1 6.8 6.3  NEUTROABS 6.6  --   --   --   --   --   HGB 8.8* 8.4* 9.0* 8.8* 8.0* 8.0*  HCT 28.1* 27.4* 29.6* 28.7* 26.0* 25.7*  MCV 89.2 89.3 89.4 89.7 89.7 89.2  PLT 239 230 235 219 208 952   Basic Metabolic Panel: Recent Labs  Lab 02/12/22 2035 02/14/22 0434  NA 139 142  K 3.8 4.0  CL 108 112*  CO2 23 26  GLUCOSE 110* 99  BUN 14 9  CREATININE 1.03 0.92  CALCIUM 8.4* 8.2*  MG  --  2.0   Liver Function Tests: Recent Labs  Lab 02/12/22 2035 02/14/22 0434  AST 21 15  ALT 12 10  ALKPHOS 54 49  BILITOT 0.3 0.5  PROT 6.2* 5.6*  ALBUMIN 2.9* 2.6*   CBG: Recent Labs  Lab 02/15/22 0728 02/15/22 1146 02/15/22 1634 02/15/22 2133 02/16/22 0728  GLUCAP 98 133* 124* 139* 109*    Discharge time spent: greater than 30 minutes.  Signed: Oswald Hillock, MD [facility] 02/16/2022

## 2022-02-18 LAB — CULTURE, BLOOD (ROUTINE X 2)
Culture: NO GROWTH
Culture: NO GROWTH
Special Requests: ADEQUATE

## 2022-02-27 ENCOUNTER — Encounter (HOSPITAL_COMMUNITY): Payer: Self-pay

## 2022-02-27 ENCOUNTER — Other Ambulatory Visit: Payer: Self-pay

## 2022-02-27 ENCOUNTER — Telehealth: Payer: Self-pay

## 2022-02-27 ENCOUNTER — Inpatient Hospital Stay (HOSPITAL_COMMUNITY)
Admission: EM | Admit: 2022-02-27 | Discharge: 2022-03-03 | DRG: 669 | Disposition: A | Discharge status: Home Health | Payer: Medicare Other | Attending: Internal Medicine | Admitting: Internal Medicine

## 2022-02-27 DIAGNOSIS — Z86718 Personal history of other venous thrombosis and embolism: Secondary | ICD-10-CM | POA: Diagnosis not present

## 2022-02-27 DIAGNOSIS — N3041 Irradiation cystitis with hematuria: Secondary | ICD-10-CM | POA: Diagnosis present

## 2022-02-27 DIAGNOSIS — C61 Malignant neoplasm of prostate: Secondary | ICD-10-CM | POA: Diagnosis present

## 2022-02-27 DIAGNOSIS — Y842 Radiological procedure and radiotherapy as the cause of abnormal reaction of the patient, or of later complication, without mention of misadventure at the time of the procedure: Secondary | ICD-10-CM | POA: Diagnosis present

## 2022-02-27 DIAGNOSIS — Z9842 Cataract extraction status, left eye: Secondary | ICD-10-CM | POA: Diagnosis not present

## 2022-02-27 DIAGNOSIS — Z87891 Personal history of nicotine dependence: Secondary | ICD-10-CM

## 2022-02-27 DIAGNOSIS — D62 Acute posthemorrhagic anemia: Secondary | ICD-10-CM | POA: Diagnosis present

## 2022-02-27 DIAGNOSIS — N400 Enlarged prostate without lower urinary tract symptoms: Secondary | ICD-10-CM | POA: Diagnosis present

## 2022-02-27 DIAGNOSIS — R310 Gross hematuria: Principal | ICD-10-CM | POA: Diagnosis present

## 2022-02-27 DIAGNOSIS — D649 Anemia, unspecified: Secondary | ICD-10-CM

## 2022-02-27 DIAGNOSIS — I10 Essential (primary) hypertension: Secondary | ICD-10-CM | POA: Diagnosis not present

## 2022-02-27 DIAGNOSIS — Z95828 Presence of other vascular implants and grafts: Secondary | ICD-10-CM

## 2022-02-27 DIAGNOSIS — Z8049 Family history of malignant neoplasm of other genital organs: Secondary | ICD-10-CM | POA: Diagnosis not present

## 2022-02-27 DIAGNOSIS — I480 Paroxysmal atrial fibrillation: Secondary | ICD-10-CM | POA: Diagnosis not present

## 2022-02-27 DIAGNOSIS — Z8546 Personal history of malignant neoplasm of prostate: Secondary | ICD-10-CM | POA: Diagnosis not present

## 2022-02-27 DIAGNOSIS — Y788 Miscellaneous radiological devices associated with adverse incidents, not elsewhere classified: Secondary | ICD-10-CM | POA: Diagnosis present

## 2022-02-27 DIAGNOSIS — Z923 Personal history of irradiation: Secondary | ICD-10-CM

## 2022-02-27 DIAGNOSIS — Z79899 Other long term (current) drug therapy: Secondary | ICD-10-CM | POA: Diagnosis not present

## 2022-02-27 DIAGNOSIS — E119 Type 2 diabetes mellitus without complications: Secondary | ICD-10-CM | POA: Diagnosis present

## 2022-02-27 DIAGNOSIS — R609 Edema, unspecified: Secondary | ICD-10-CM | POA: Diagnosis present

## 2022-02-27 DIAGNOSIS — Z801 Family history of malignant neoplasm of trachea, bronchus and lung: Secondary | ICD-10-CM | POA: Diagnosis not present

## 2022-02-27 DIAGNOSIS — Z9841 Cataract extraction status, right eye: Secondary | ICD-10-CM

## 2022-02-27 DIAGNOSIS — R319 Hematuria, unspecified: Secondary | ICD-10-CM | POA: Diagnosis not present

## 2022-02-27 LAB — COMPREHENSIVE METABOLIC PANEL
ALT: 13 U/L (ref 0–44)
AST: 33 U/L (ref 15–41)
Albumin: 3.1 g/dL — ABNORMAL LOW (ref 3.5–5.0)
Alkaline Phosphatase: 51 U/L (ref 38–126)
Anion gap: 7 (ref 5–15)
BUN: 13 mg/dL (ref 8–23)
CO2: 24 mmol/L (ref 22–32)
Calcium: 8.7 mg/dL — ABNORMAL LOW (ref 8.9–10.3)
Chloride: 109 mmol/L (ref 98–111)
Creatinine, Ser: 0.96 mg/dL (ref 0.61–1.24)
GFR, Estimated: >60 mL/min (ref >60)
Glucose, Bld: 121 mg/dL — ABNORMAL HIGH (ref 70–99)
Potassium: 4.8 mmol/L (ref 3.5–5.1)
Sodium: 140 mmol/L (ref 135–145)
Total Bilirubin: 0.5 mg/dL (ref 0.3–1.2)
Total Protein: 6.3 g/dL — ABNORMAL LOW (ref 6.5–8.1)

## 2022-02-27 LAB — CBC WITH DIFFERENTIAL/PLATELET
Abs Immature Granulocytes: 0.03 10*3/uL (ref 0.00–0.07)
Basophils Absolute: 0 10*3/uL (ref 0.0–0.1)
Basophils Relative: 0 %
Eosinophils Absolute: 0.1 10*3/uL (ref 0.0–0.5)
Eosinophils Relative: 1 %
HCT: 23.3 % — ABNORMAL LOW (ref 39.0–52.0)
Hemoglobin: 6.7 g/dL — CL (ref 13.0–17.0)
Immature Granulocytes: 0 %
Lymphocytes Relative: 13 %
Lymphs Abs: 1.1 10*3/uL (ref 0.7–4.0)
MCH: 26.7 pg (ref 26.0–34.0)
MCHC: 28.8 g/dL — ABNORMAL LOW (ref 30.0–36.0)
MCV: 92.8 fL (ref 80.0–100.0)
Monocytes Absolute: 0.8 10*3/uL (ref 0.1–1.0)
Monocytes Relative: 10 %
Neutro Abs: 6.2 10*3/uL (ref 1.7–7.7)
Neutrophils Relative %: 76 %
Platelets: 284 10*3/uL (ref 150–400)
RBC: 2.51 MIL/uL — ABNORMAL LOW (ref 4.22–5.81)
RDW: 18.6 % — ABNORMAL HIGH (ref 11.5–15.5)
WBC: 8.2 10*3/uL (ref 4.0–10.5)
nRBC: 0 % (ref 0.0–0.2)

## 2022-02-27 LAB — APTT: aPTT: 29 seconds (ref 24–36)

## 2022-02-27 LAB — PREPARE RBC (CROSSMATCH)

## 2022-02-27 LAB — PROTIME-INR
INR: 1.2 (ref 0.8–1.2)
Prothrombin Time: 15.2 seconds (ref 11.4–15.2)

## 2022-02-27 MED ORDER — MAGNESIUM OXIDE -MG SUPPLEMENT 400 (240 MG) MG PO TABS
400.0000 mg | ORAL_TABLET | Freq: Every day | ORAL | Status: DC
  Administered 2022-02-28 – 2022-03-03 (×3): 400 mg via ORAL
  Filled 2022-02-27 (×3): qty 1

## 2022-02-27 MED ORDER — SODIUM CHLORIDE 0.9 % IR SOLN
3000.0000 mL | Status: DC
  Administered 2022-02-27 – 2022-03-01 (×12): 3000 mL

## 2022-02-27 MED ORDER — FOLIC ACID 1 MG PO TABS
1.0000 mg | ORAL_TABLET | Freq: Every day | ORAL | Status: DC
  Administered 2022-02-28 – 2022-03-03 (×3): 1 mg via ORAL
  Filled 2022-02-27 (×3): qty 1

## 2022-02-27 MED ORDER — ACETAMINOPHEN 500 MG PO TABS: 1000.0000 mg | ORAL_TABLET | Freq: Every day | ORAL | Status: DC | PRN

## 2022-02-27 MED ORDER — SODIUM CHLORIDE 0.9 % IV SOLN
INTRAVENOUS | Status: DC
Start: 2022-02-27 — End: 2022-03-03

## 2022-02-27 MED ORDER — FERROUS SULFATE 325 (65 FE) MG PO TABS
325.0000 mg | ORAL_TABLET | Freq: Every day | ORAL | Status: DC
  Administered 2022-02-28 – 2022-03-03 (×3): 325 mg via ORAL
  Filled 2022-02-27 (×3): qty 1

## 2022-02-27 MED ORDER — VITAMIN B-12 1000 MCG PO TABS
1000.0000 ug | ORAL_TABLET | Freq: Every day | ORAL | Status: DC
  Administered 2022-02-28 – 2022-03-03 (×3): 1000 ug via ORAL
  Filled 2022-02-27 (×3): qty 1

## 2022-02-27 MED ORDER — MORPHINE SULFATE (PF) 2 MG/ML IV SOLN: 2.0000 mg | INTRAVENOUS | Status: DC | PRN

## 2022-02-27 MED ORDER — METOPROLOL SUCCINATE ER 50 MG PO TB24: 100.0000 mg | ORAL_TABLET | Freq: Every day | ORAL | Status: DC

## 2022-02-27 MED ORDER — GABAPENTIN 300 MG PO CAPS: 300.0000 mg | ORAL_CAPSULE | Freq: Three times a day (TID) | ORAL | Status: DC | PRN

## 2022-02-27 MED ORDER — SODIUM CHLORIDE 0.9 % IV SOLN: 10.0000 mL/h | Freq: Once | INTRAVENOUS | Status: DC

## 2022-02-27 MED ORDER — POLYETHYLENE GLYCOL 3350 17 G PO PACK: 17.0000 g | PACK | Freq: Every day | ORAL | Status: DC | PRN

## 2022-02-27 MED ORDER — OXYCODONE HCL 5 MG PO TABS
5.0000 mg | ORAL_TABLET | ORAL | Status: DC | PRN
  Administered 2022-03-01 – 2022-03-02 (×3): 5 mg via ORAL
  Filled 2022-02-27 (×4): qty 1

## 2022-02-27 MED ORDER — METOPROLOL SUCCINATE ER 100 MG PO TB24
100.0000 mg | ORAL_TABLET | Freq: Every day | ORAL | Status: DC
  Administered 2022-02-28 – 2022-03-03 (×4): 100 mg via ORAL
  Filled 2022-02-27 (×2): qty 1
  Filled 2022-02-27 (×2): qty 2

## 2022-02-27 NOTE — Telephone Encounter (Signed)
Patient's daughter called advising that patient is currently admitted at [facility] for hematuria. Daughter advised that when he was adnitted prior you seen him. I advised her that you were probably the on call and they would only notify you if you were the patients urologist or the on call. Patient's daughter advised she planned on transferring care to you and just wanted me to relay the message.

## 2022-02-27 NOTE — H&P (Signed)
History and Physical    Nathan [name] JJH:[id number] DOB: 05-Jul-1945 DOA: 02/27/2022  PCP: [name], MD   Patient coming from: Home    Chief Complaint: Sent from urology office for hematuria  HPI: Nathan [name] is a 76 y.o. male with medical history significant of prostate cancer, following with urology, paroxysmal A-fib, chronic hematuria, hypertension, diet-controlled diabetes type 2,DVT who was sent from urology office to ED after he was found to be anemic.  Patient had recent history of cystoscopy with fulguration by urology about a month ago( July) and has been on  Foley catheter since then.  He was admitted on August 15 with complaints of hematuria.  At that time , urology was consulted and was started on continuous bladder irrigation followed by clearing of urine and was sent home on August 18 with plan to follow-up with urology.  Patient states that he has been having red urine since last discharge.  He was feeling really dizzy, lightheaded and weak.  He followed at urology office today with bloody urine in the catheter bag.  Hemoglobin was checked and it was 6.3.  He was irrigated with 2.5 L of fluid and was removed a copious amount of clot burden.  He was then sent to the emergency department. Patient seen and examined at the bedside in the emergency department.  During my evaluation, he was hemodynamically stable.  There was a gross blood  in the Foley bag.  No history of fever, chills, chest pain, abdominal pain, nausea, vomiting, diarrhea, dysuria,hematochezia or melena.  Patient being admitted for the management of acute blood loss anemia secondary to persistent hematuria awaiting urology intervention.   ED Course: Hemoglobin was 6.8 on presentation.  He is being transfused with 2 units of PRBC.  Urology will see him here.  Dr. Gloriann Loan aware.  Hemodynamically stable during my evaluation in the emergency department.  Daughter was at bedside.  Review of Systems: As per HPI  otherwise 10 point review of systems negative.    Past Medical History:  Diagnosis Date   Anginal pain ([place])    Atrial fibrillation ([place]) 01/2020   resolved now    Cancer of prostate ([place]) 2020   Chronic anticoagulation    Diabetes mellitus without complication ([place])    type 2 diet controlled   Dysrhythmia    a fib   Foley catheter in place    will be changed 05-16-20   HTN (hypertension)    Lipoma of chest wall    Pneumonia 01/2020   and uti in hospital for 1 week   WPW (Wolff-Parkinson-White syndrome)    No prior ablation    Past Surgical History:  Procedure Laterality Date   CATARACT EXTRACTION Bilateral 2020   CYSTOSCOPY N/A 06/05/2021   Procedure: CYSTOSCOPY WITH SUPRA PUBIC TUBE PLACEMENT;  Surgeon: Raynelle Bring, MD;  Location: [facility];  Service: Urology;  Laterality: N/A;   CYSTOSCOPY WITH FULGERATION N/A 01/27/2022   Procedure: [place] WITH CLOT EVACUATION;  Surgeon: Ardis Hughs, MD;  Location: [facility];  Service: Urology;  Laterality: N/A;   IR 3D INDEPENDENT WKST  01/17/2022   IR ANGIOGRAM PELVIS SELECTIVE OR SUPRASELECTIVE  01/17/2022   IR ANGIOGRAM SELECTIVE EACH ADDITIONAL VESSEL  01/17/2022   IR ANGIOGRAM SELECTIVE EACH ADDITIONAL VESSEL  01/17/2022   IR EMBO ART  VEN HEMORR LYMPH EXTRAV  INC GUIDE ROADMAPPING  01/17/2022   IR US GUIDE VASC ACCESS RIGHT  01/17/2022   LIPOMA EXCISION Left 05/16/2020  Procedure: EXCISION LIPOMA LEFT CHEST WALL;  Surgeon: Kinsinger, Arta Bruce, MD;  Location: [facility];  Service: General;  Laterality: Left;   RADIOACTIVE SEED IMPLANT  2020   TRANSURETHRAL RESECTION OF BLADDER TUMOR N/A 01/17/2022   Procedure: CYSTOSCOPY WITH CLOT EVACUATION, FULGERATION OF PROSTATE;  Surgeon: Festus Aloe, MD;  Location: [facility];  Service: Urology;  Laterality: N/A;   VENA CAVA FILTER PLACEMENT Right 12/10/2021   Procedure: INSERTION VENA-CAVA FILTER Right Femoral;  Surgeon: Waynetta Sandy, MD;   Location: [place];  Service: Vascular;  Laterality: Right;     reports that he quit smoking about 3 years ago. His smoking use included cigarettes. He has a 30.00 pack-year smoking history. He has never used smokeless tobacco. He reports that he does not currently use alcohol. He reports that he does not use drugs.  No Known Allergies  Family History  Problem Relation Age of Onset   Lung cancer Mother    Cervical cancer Sister    Breast cancer Neg Hx    Pancreatic cancer Neg Hx    Colon cancer Neg Hx      Prior to Admission medications   Medication Sig Start Date End Date Taking? Authorizing Provider  acetaminophen (TYLENOL) 500 MG tablet Take 1,000 mg by mouth daily as needed (pain).   Yes [provider]  acetic acid 0.25 % irrigation Apply 1 Application topically every other day. Intravesical use 02/02/22  Yes [provider]  ergocalciferol (VITAMIN D2) 1.25 MG (50000 UT) capsule Take 50,000 Units by mouth once a week.   Yes [provider]  ferrous sulfate 325 (65 FE) MG tablet Take 325 mg by mouth daily with breakfast.   Yes [provider]  finasteride (PROSCAR) 5 MG tablet Take 5 mg by mouth daily.   Yes [provider]  folic acid (FOLVITE) 1 MG tablet Take 1 mg by mouth daily. 03/24/19  Yes [provider]  gabapentin (NEURONTIN) 300 MG capsule Take 300 mg by mouth 3 (three) times daily as needed (nerve pain). 10/27/21  Yes [provider]  lisinopril (ZESTRIL) 40 MG tablet Take 40 mg by mouth daily.   Yes [provider]  magnesium oxide (MAG-OX) 400 MG tablet Take 400 mg by mouth daily.  03/24/19  Yes [provider]  metoprolol succinate (TOPROL-XL) 100 MG 24 hr tablet Take 100 mg by mouth daily. 11/14/21  Yes [provider]  Omega-3 Fatty Acids (FISH OIL) 1000 MG CAPS Take 1,000 mg by mouth daily.   Yes [provider]  Prenatal Vit-Fe Fumarate-FA (PRENATAL MULTIVITAMIN) TABS tablet  Take 1 tablet by mouth daily at 12 noon.   Yes [provider]  Sodium Chloride Flush (SALINE FLUSH IV) Inject into the vein daily.   Yes [provider]  vitamin B-12 (CYANOCOBALAMIN) 1000 MCG tablet Take 1,000 mcg by mouth daily.   Yes [provider]  oxycodone (OXY-IR) 5 MG capsule Take 5 mg by mouth every 6 (six) hours as needed for pain. Patient not taking: Reported on 02/27/2022    [provider]    Physical Exam: Vitals:   02/27/22 1322 02/27/22 1415 02/27/22 1445 02/27/22 1515  BP: 117/85 125/87 (!) 147/78 (!) 143/80  Pulse: 72 81 74 79  Resp: '18 18 15 18  '$ Temp: 98.1 F (36.7 C)     TempSrc: Oral     SpO2: 98% 100% 99% 100%    Constitutional: NAD, calm, comfortable Vitals:   02/27/22  1322 02/27/22 1415 02/27/22 1445 02/27/22 1515  BP: 117/85 125/87 (!) 147/78 (!) 143/80  Pulse: 72 81 74 79  Resp: '18 18 15 18  '$ Temp: 98.1 F (36.7 C)     TempSrc: Oral     SpO2: 98% 100% 99% 100%   Eyes: PERRL, lids and conjunctivae normal ENMT: Dry mouth Neck: normal, supple, no masses, no thyromegaly Respiratory: clear to auscultation bilaterally, no wheezing, no crackles. Normal respiratory effort. No accessory muscle use.  Cardiovascular: Regular rate and rhythm, no murmurs / rubs / gallops. No extremity edema.  Abdomen: no tenderness, no masses palpated. No hepatosplenomegaly. Bowel sounds positive.  Musculoskeletal: no clubbing / cyanosis. No joint deformity upper and lower extremities.  Skin: no rashes, lesions, ulcers. No induration Neurologic: CN 2-12 grossly intact.  Strength 5/5 in all 4.  Psychiatric: Normal judgment and insight. Alert and oriented x 3. Normal mood.  GU: Foley with gross bloody drainage  Foley Catheter:None  Labs on Admission: I have personally reviewed following labs and imaging studies  CBC: Recent Labs  Lab 02/27/22 1420  WBC 8.2  NEUTROABS 6.2  HGB 6.7*  HCT 23.3*  MCV 92.8  PLT 174   Basic Metabolic  Panel: Recent Labs  Lab 02/27/22 1420  NA 140  K 4.8  CL 109  CO2 24  GLUCOSE 121*  BUN 13  CREATININE 0.96  CALCIUM 8.7*   GFR: CrCl cannot be calculated (Unknown ideal weight.). Liver Function Tests: Recent Labs  Lab 02/27/22 1420  AST 33  ALT 13  ALKPHOS 51  BILITOT 0.5  PROT 6.3*  ALBUMIN 3.1*   No results for input(s): "LIPASE", "AMYLASE" in the last 168 hours. No results for input(s): "AMMONIA" in the last 168 hours. Coagulation Profile: Recent Labs  Lab 02/27/22 1420  INR 1.2   Cardiac Enzymes: No results for input(s): "CKTOTAL", "CKMB", "CKMBINDEX", "TROPONINI" in the last 168 hours. BNP (last 3 results) No results for input(s): "PROBNP" in the last 8760 hours. HbA1C: No results for input(s): "HGBA1C" in the last 72 hours. CBG: No results for input(s): "GLUCAP" in the last 168 hours. Lipid Profile: No results for input(s): "CHOL", "HDL", "LDLCALC", "TRIG", "CHOLHDL", "LDLDIRECT" in the last 72 hours. Thyroid Function Tests: No results for input(s): "TSH", "T4TOTAL", "FREET4", "T3FREE", "THYROIDAB" in the last 72 hours. Anemia Panel: No results for input(s): "VITAMINB12", "FOLATE", "FERRITIN", "TIBC", "IRON", "RETICCTPCT" in the last 72 hours. Urine analysis:    Component Value Date/Time   COLORURINE RED (A) 02/12/2022 2113   APPEARANCEUR TURBID (A) 02/12/2022 2113   LABSPEC  02/12/2022 2113    TEST NOT REPORTED DUE TO COLOR INTERFERENCE OF URINE PIGMENT   PHURINE  02/12/2022 2113    TEST NOT REPORTED DUE TO COLOR INTERFERENCE OF URINE PIGMENT   GLUCOSEU (A) 02/12/2022 2113    TEST NOT REPORTED DUE TO COLOR INTERFERENCE OF URINE PIGMENT   HGBUR (A) 02/12/2022 2113    TEST NOT REPORTED DUE TO COLOR INTERFERENCE OF URINE PIGMENT   BILIRUBINUR (A) 02/12/2022 2113    TEST NOT REPORTED DUE TO COLOR INTERFERENCE OF URINE PIGMENT   KETONESUR (A) 02/12/2022 2113    TEST NOT REPORTED DUE TO COLOR INTERFERENCE OF URINE PIGMENT   PROTEINUR (A) 02/12/2022  2113    TEST NOT REPORTED DUE TO COLOR INTERFERENCE OF URINE PIGMENT   NITRITE (A) 02/12/2022 2113    TEST NOT REPORTED DUE TO COLOR INTERFERENCE OF URINE PIGMENT   LEUKOCYTESUR (A) 02/12/2022 2113    TEST NOT  REPORTED DUE TO COLOR INTERFERENCE OF URINE PIGMENT    Radiological Exams on Admission: No results found.   Assessment/Plan Principal Problem:   Acute blood loss anemia (ABLA) Active Problems:   Gross hematuria   Paroxysmal atrial fibrillation (HCC)   Essential hypertension   Malignant neoplasm of prostate (HCC)   Acute blood loss anemia/hematuria: Secondary to acute on chronic hematuria.  He was discharged on August 18 after CBI when he presented with similar symptoms.  Patient reports that he has been having red urine since then. Found to have anemia with hemoglobin of 6.8 on presentation.  Being transfused 2 units of PRBC.  Continue to monitor H&H. Takes oral iron supplementation. Continue gentle IV fluids till tomorrow.  Prostate cancer: Follows with urology.  History of radiation to the prostate, history of cystoscopy with fulguration about a month ago.  On finasteride.  A-fib: Currently in normal sinus rhythm.  On rate control with metoprolol.  No anticoagulation due to history of chronic hematuria.  Hypertension: Takes metoprolol, lisinopril.  Currently blood pressure stable.  Continue metoprolol for now.  History of DVT of right lower extremity: Not on anticoagulation again due to chronic hematuria.  History of IVC filter placement.  Right lower extremity remains edematous  Prediabetes: A1c of 5.1 as per 11/2021.  Currently diet controlled      Severity of Illness: The appropriate patient status for this patient is INPATIENT   DVT prophylaxis: SCD Code Status: Full Family Communication: Daughter at bedside Consults called: Urology will followg     Shelly Coss MD [facility]  02/27/2022, 3:53 PM

## 2022-02-27 NOTE — ED Notes (Signed)
Urology at the bedside.

## 2022-02-27 NOTE — ED Notes (Signed)
Urology cart at bedside 

## 2022-02-27 NOTE — ED Triage Notes (Signed)
Pt reports bloody urine in foley bag for a while now. Pt had labs done and was told his hemoglobin was 6.2. Pt was sent here for further evaluation.

## 2022-02-27 NOTE — ED Notes (Signed)
Blood bank has units ready for this patient, notified Crystal,RN.

## 2022-02-27 NOTE — ED Provider Notes (Signed)
[facility] DEPT Provider Note   CSN: [id number] Arrival date & time: 02/27/22  1255     History  Chief Complaint  Patient presents with   Hematuria    Nathan [name] is a 76 y.o. male.   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 HgB is 6.8, He has a s/p tube draining to gravity and a 3 way foley catheter in the urethra size 24. He was irrigated with 2.5 liters and removed a copious amount of clot burden. His urine is bright red.  And he reports its been ongoing since leaving the hospital the last time.  He denies any chest pain but does complain of some shortness of breath when he gets up and does any activity.  He has not been on any anticoagulation since February.  He denies any pain.  The history is provided by the patient and medical records.       Home Medications Prior to Admission medications   Medication Sig Start Date End Date Taking? Authorizing Provider  acetaminophen (TYLENOL) 500 MG tablet Take 1,000 mg by mouth daily as needed (pain).   Yes [provider]  acetic acid 0.25 % irrigation Apply 1 Application topically every other day. Intravesical use 02/02/22  Yes [provider]  ergocalciferol (VITAMIN D2) 1.25 MG (50000 UT) capsule Take 50,000 Units by mouth once a week.   Yes [provider]  ferrous sulfate 325 (65 FE) MG tablet Take 325 mg by mouth daily with breakfast.   Yes [provider]  finasteride (PROSCAR) 5 MG tablet Take 5 mg by mouth daily.   Yes [provider]  folic acid (FOLVITE) 1 MG tablet Take 1 mg by mouth daily. 03/24/19  Yes [provider]  gabapentin (NEURONTIN) 300 MG capsule Take 300 mg by mouth 3 (three) times daily as needed (nerve pain). 10/27/21  Yes [provider]  lisinopril (ZESTRIL) 40 MG tablet Take 40 mg by mouth daily.   Yes [provider]  magnesium oxide (MAG-OX) 400 MG tablet Take 400 mg by mouth daily.  03/24/19  Yes [provider]  metoprolol succinate (TOPROL-XL) 100 MG 24 hr tablet Take 100 mg by mouth daily. 11/14/21  Yes [provider]  Omega-3 Fatty Acids (FISH OIL) 1000 MG CAPS Take 1,000 mg by mouth daily.   Yes [provider]  Prenatal Vit-Fe Fumarate-FA (PRENATAL MULTIVITAMIN) TABS tablet Take 1 tablet by mouth daily at 12 noon.   Yes [provider]  Sodium Chloride Flush (SALINE FLUSH IV) Inject into the vein daily.   Yes [provider]  vitamin B-12 (CYANOCOBALAMIN) 1000 MCG tablet Take 1,000 mcg by mouth daily.   Yes [provider]  oxycodone (OXY-IR) 5 MG capsule Take 5 mg by mouth every 6 (six) hours as needed for pain. Patient not taking: Reported on 02/27/2022    [provider]      Allergies    Patient has no known allergies.    Review of Systems   Review of Systems  Physical Exam Updated Vital Signs BP (!) 147/78   Pulse 74   Temp 98.1 F (36.7 C) (Oral)   Resp 15   SpO2 99%  Physical Exam Vitals and nursing note reviewed.  Constitutional:  General: He is not in acute distress.    Appearance: He is well-developed.  HENT:     Head: Normocephalic and atraumatic.  Eyes:     Pupils: Pupils are equal, round, and reactive to light.     Comments: Pale conjunctive a and oral mucosa  Cardiovascular:     Rate and Rhythm: Normal rate and regular rhythm.     Pulses: Normal pulses.     Heart sounds: No murmur heard. Pulmonary:     Effort: Pulmonary effort is normal. No respiratory distress.     Breath sounds: Normal breath sounds. No wheezing or rales.  Abdominal:     General: There is no distension.     Palpations: Abdomen is soft.     Tenderness: There is no abdominal tenderness. There is no guarding or rebound.   Genitourinary:    Comments: Foley catheter in place draining blood Musculoskeletal:        General: No tenderness. Normal range of motion.     Cervical back: Normal range of motion and neck supple.  Skin:    General: Skin is warm and dry.     Coloration: Skin is pale.     Findings: No erythema or rash.  Neurological:     Mental Status: He is alert and oriented to person, place, and time.  Psychiatric:        Behavior: Behavior normal.     ED Results / Procedures / Treatments   Labs (all labs ordered are listed, but only abnormal results are displayed) Labs Reviewed  CBC WITH DIFFERENTIAL/PLATELET - Abnormal; Notable for the following components:      Result Value   RBC 2.51 (*)    Hemoglobin 6.7 (*)    HCT 23.3 (*)    MCHC 28.8 (*)    RDW 18.6 (*)    All other components within normal limits  COMPREHENSIVE METABOLIC PANEL - Abnormal; Notable for the following components:   Glucose, Bld 121 (*)    Calcium 8.7 (*)    Total Protein 6.3 (*)    Albumin 3.1 (*)    All other components within normal limits  PROTIME-INR  APTT  TYPE AND SCREEN  PREPARE RBC (CROSSMATCH)    EKG None  Radiology No results found.  Procedures Procedures    Medications Ordered in ED Medications  0.9 %  sodium chloride infusion (has no administration in time range)    ED Course/ Medical Decision Making/ A&P                           Medical Decision Making Amount and/or Complexity of Data Reviewed External Data Reviewed: notes.    Details: Urology, recent hospitalization Labs: ordered. Decision-making details documented in ED Course.  Risk Prescription drug management.   Pt with multiple medical problems and comorbidities and presenting today with a complaint that caries a high risk for morbidity and mortality.  Here today from the urology office for generalized weakness, ongoing bleeding from his Foley catheter.  He was irrigated in the office with a large clot burden removed  and ongoing bleeding.  He is not anticoagulated at this time.  Urology office reported that patient's hemoglobin was 6.8 today however no results available for this.  He has required blood transfusion in the past.  Patient will need blood transfusion today, admission and additional intervention by urology.  He denies any infectious symptoms at this time is not displaying signs of sepsis.  3:19 PM I independently interpreted patient's labs today and patient has recurrent anemia with a hemoglobin of 6.7 from his baseline between 8 and 9 after transfusion last month.  CMP within normal limits.  Dr. Gloriann Loan is planning on coming and doing a procedure on the patient.  He will need admission for further care.  Transfusion was ordered.  Patient has been agreeable to transfusion.  CRITICAL CARE Performed by: Akesha Uresti Total critical care time: 30 minutes Critical care time was exclusive of separately billable procedures and treating other patients. Critical care was necessary to treat or prevent imminent or life-threatening deterioration. Critical care was time spent personally by me on the following activities: development of treatment plan with patient and/or surrogate as well as nursing, discussions with consultants, evaluation of patient's response to treatment, examination of patient, obtaining history from patient or surrogate, ordering and performing treatments and interventions, ordering and review of laboratory studies, ordering and review of radiographic studies, pulse oximetry and re-evaluation of patient's condition.          Final Clinical Impression(s) / ED Diagnoses Final diagnoses:  Gross hematuria  Symptomatic anemia    Rx / DC Orders ED Discharge Orders     None         Blanchie Dessert, MD 02/27/22 1519

## 2022-02-27 NOTE — Consult Note (Signed)
H&P Physician requesting consult: Amrit Adhikari  Chief Complaint: Gross hematuria  History of Present Illness: Note from clinic earlier today:  1. Prostate cancer  2. BPH/hematuria  3. Chronic urinary retention   HPI: 76 year old man who has a past medical history of prostate cancer treated with radiation, chronic urinary retention, and hematuria presents today with concerns of gross hematuria. He has been hospitalized multiple times over the last 3 months due to gross hematuria requiring transfusion and cystoscopy with fulguration as well as a PAE. His last hospitalization was approximately 1 month ago. He was discharged from the hospital on 02/12/22. At that time, his urine was clear. Over the course of the week at home it became bloody again and he was seen in our clinic by our on-call physician who placed a urethral catheter and irrigated him until his urine was again a light pink color. His family has been also flushing his catheter at home however, he is continuing to have grossly bloody urine with large clots. He denies pain and discomfort. He endorses fatigue, dizziness, orthostasis. He is slightly pale. He has a suprapubic catheter as well as a 24 hematuria catheter in his urethra. This that H&H were drawn today. This revealed a hemoglobin of 6.8 and hematocrit of 23.   --Interval: Patient was sent to the emergency department.  In the clinic, 2.5 L was irrigated and large amount of clot was evacuated from the bladder.  Patient continues with gross hematuria.   Past Medical History:  Diagnosis Date   Anginal pain ([place])    Atrial fibrillation ([place]) 01/2020   resolved now    Cancer of prostate ([place]) 2020   Chronic anticoagulation    Diabetes mellitus without complication ([place])    type 2 diet controlled   Dysrhythmia    a fib   Foley catheter in place    will be changed 05-16-20   HTN (hypertension)    Lipoma of chest wall    Pneumonia 01/2020   and uti in hospital for 1 week    WPW (Wolff-Parkinson-White syndrome)    No prior ablation   Past Surgical History:  Procedure Laterality Date   CATARACT EXTRACTION Bilateral 2020   CYSTOSCOPY N/A 06/05/2021   Procedure: CYSTOSCOPY WITH SUPRA PUBIC TUBE PLACEMENT;  Surgeon: Raynelle Bring, MD;  Location: [facility];  Service: Urology;  Laterality: N/A;   CYSTOSCOPY WITH FULGERATION N/A 01/27/2022   Procedure: [place] WITH CLOT EVACUATION;  Surgeon: Ardis Hughs, MD;  Location: [facility];  Service: Urology;  Laterality: N/A;   IR 3D INDEPENDENT WKST  01/17/2022   IR ANGIOGRAM PELVIS SELECTIVE OR SUPRASELECTIVE  01/17/2022   IR ANGIOGRAM SELECTIVE EACH ADDITIONAL VESSEL  01/17/2022   IR ANGIOGRAM SELECTIVE EACH ADDITIONAL VESSEL  01/17/2022   IR EMBO ART  VEN HEMORR LYMPH EXTRAV  INC GUIDE ROADMAPPING  01/17/2022   IR US GUIDE VASC ACCESS RIGHT  01/17/2022   LIPOMA EXCISION Left 05/16/2020   Procedure: EXCISION LIPOMA LEFT CHEST WALL;  Surgeon: Kieth Brightly Arta Bruce, MD;  Location: [facility];  Service: General;  Laterality: Left;   RADIOACTIVE SEED IMPLANT  2020   TRANSURETHRAL RESECTION OF BLADDER TUMOR N/A 01/17/2022   Procedure: CYSTOSCOPY WITH CLOT EVACUATION, FULGERATION OF PROSTATE;  Surgeon: Festus Aloe, MD;  Location: [facility];  Service: Urology;  Laterality: N/A;   VENA CAVA FILTER PLACEMENT Right 12/10/2021   Procedure: INSERTION VENA-CAVA FILTER Right Femoral;  Surgeon: Waynetta Sandy, MD;  Location: [facility];  Service: Vascular;  Laterality: Right;    Home Medications:  (Not in a hospital admission)  Allergies: No Known Allergies  Family History  Problem Relation Age of Onset   Lung cancer Mother    Cervical cancer Sister    Breast cancer Neg Hx    Pancreatic cancer Neg Hx    Colon cancer Neg Hx    Social History:  reports that he quit smoking about 3 years ago. His smoking use included cigarettes. He has a 30.00 pack-year smoking history. He has never used  smokeless tobacco. He reports that he does not currently use alcohol. He reports that he does not use drugs.  ROS: A complete review of systems was performed.  All systems are negative except for pertinent findings as noted. ROS   Physical Exam:  Vital signs in last 24 hours: Temp:  [98.1 F (36.7 C)] 98.1 F (36.7 C) (08/29 1322) Pulse Rate:  [72-85] 85 (08/29 1615) Resp:  [15-18] 18 (08/29 1515) BP: (117-147)/(77-87) 126/77 (08/29 1615) SpO2:  [98 %-100 %] 100 % (08/29 1615) General:  Alert and oriented, No acute distress HEENT: Normocephalic, atraumatic Neck: No JVD or lymphadenopathy Cardiovascular: Regular rate and rhythm Lungs: Regular rate and effort Abdomen: Soft, nontender, nondistended, no abdominal masses Back: No CVA tenderness Genitourinary: Suprapubic tube in place draining dark red urine.  Three-Way Foley Catheter capped Extremities: No edema Neurologic: Grossly intact  Laboratory Data:  Results for orders placed or performed during the hospital encounter of 02/27/22 (from the past 24 hour(s))  CBC with Differential/Platelet     Status: Abnormal   Collection Time: 02/27/22  2:20 PM  Result Value Ref Range   WBC 8.2 4.0 - 10.5 K/uL   RBC 2.51 (L) 4.22 - 5.81 MIL/uL   Hemoglobin 6.7 (LL) 13.0 - 17.0 g/dL   HCT 23.3 (L) 39.0 - 52.0 %   MCV 92.8 80.0 - 100.0 fL   MCH 26.7 26.0 - 34.0 pg   MCHC 28.8 (L) 30.0 - 36.0 g/dL   RDW 18.6 (H) 11.5 - 15.5 %   Platelets 284 150 - 400 K/uL   nRBC 0.0 0.0 - 0.2 %   Neutrophils Relative % 76 %   Neutro Abs 6.2 1.7 - 7.7 K/uL   Lymphocytes Relative 13 %   Lymphs Abs 1.1 0.7 - 4.0 K/uL   Monocytes Relative 10 %   Monocytes Absolute 0.8 0.1 - 1.0 K/uL   Eosinophils Relative 1 %   Eosinophils Absolute 0.1 0.0 - 0.5 K/uL   Basophils Relative 0 %   Basophils Absolute 0.0 0.0 - 0.1 K/uL   Immature Granulocytes 0 %   Abs Immature Granulocytes 0.03 0.00 - 0.07 K/uL  Comprehensive metabolic panel     Status: Abnormal    Collection Time: 02/27/22  2:20 PM  Result Value Ref Range   Sodium 140 135 - 145 mmol/L   Potassium 4.8 3.5 - 5.1 mmol/L   Chloride 109 98 - 111 mmol/L   CO2 24 22 - 32 mmol/L   Glucose, Bld 121 (H) 70 - 99 mg/dL   BUN 13 8 - 23 mg/dL   Creatinine, Ser 0.96 0.61 - 1.24 mg/dL   Calcium 8.7 (L) 8.9 - 10.3 mg/dL   Total Protein 6.3 (L) 6.5 - 8.1 g/dL   Albumin 3.1 (L) 3.5 - 5.0 g/dL   AST 33 15 - 41 U/L   ALT 13 0 - 44 U/L   Alkaline Phosphatase 51 38 - 126 U/L  Total Bilirubin 0.5 0.3 - 1.2 mg/dL   GFR, Estimated >60 >60 mL/min   Anion gap 7 5 - 15  Type and screen     Status: None (Preliminary result)   Collection Time: 02/27/22  2:20 PM  Result Value Ref Range   ABO/RH(D) B POS    Antibody Screen NEG    Sample Expiration      03/02/2022,2359 Performed at [facility], [place] [address] [place], [place] [address]    Unit Number [id number]    Blood Component Type RED CELLS,LR    Unit division 00    Status of Unit ALLOCATED    Transfusion Status OK TO TRANSFUSE    Crossmatch Result Compatible    Unit Number [id number]    Blood Component Type RED CELLS,LR    Unit division 00    Status of Unit ALLOCATED    Transfusion Status OK TO TRANSFUSE    Crossmatch Result Compatible   Protime-INR     Status: None   Collection Time: 02/27/22  2:20 PM  Result Value Ref Range   Prothrombin Time 15.2 11.4 - 15.2 seconds   INR 1.2 0.8 - 1.2  APTT     Status: None   Collection Time: 02/27/22  2:20 PM  Result Value Ref Range   aPTT 29 24 - 36 seconds  Prepare RBC (crossmatch)     Status: None   Collection Time: 02/27/22  3:13 PM  Result Value Ref Range   Order Confirmation      ORDER PROCESSED BY BLOOD BANK Performed at [facility], [place] [address] [place], [place] [address]    No results found for this or any previous visit (from the past 240 hour(s)). Creatinine: Recent Labs    02/27/22 1420  CREATININE 0.96   Procedure: Bladder  irrigation With sterile water, I irrigated the hematuria catheter as well as the suprapubic tube with 1 L of sterile water.  There was return of moderate amount of clot.  I irrigated until I did not get a more clot back.  I then initiated continuous bladder irrigation.  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  Agree with blood transfusion and monitor hemoglobin.  Nursing may irrigate Foley as needed.  Marton Redwood, III 02/27/2022, 5:16 PM

## 2022-02-28 DIAGNOSIS — I480 Paroxysmal atrial fibrillation: Secondary | ICD-10-CM | POA: Diagnosis not present

## 2022-02-28 DIAGNOSIS — R310 Gross hematuria: Secondary | ICD-10-CM

## 2022-02-28 DIAGNOSIS — I10 Essential (primary) hypertension: Secondary | ICD-10-CM

## 2022-02-28 DIAGNOSIS — D62 Acute posthemorrhagic anemia: Secondary | ICD-10-CM | POA: Diagnosis not present

## 2022-02-28 LAB — BASIC METABOLIC PANEL
Anion gap: 4 — ABNORMAL LOW (ref 5–15)
BUN: 12 mg/dL (ref 8–23)
CO2: 26 mmol/L (ref 22–32)
Calcium: 8.5 mg/dL — ABNORMAL LOW (ref 8.9–10.3)
Chloride: 111 mmol/L (ref 98–111)
Creatinine, Ser: 0.92 mg/dL (ref 0.61–1.24)
GFR, Estimated: >60 mL/min (ref >60)
Glucose, Bld: 101 mg/dL — ABNORMAL HIGH (ref 70–99)
Potassium: 4.2 mmol/L (ref 3.5–5.1)
Sodium: 141 mmol/L (ref 135–145)

## 2022-02-28 LAB — CBC
HCT: 24.5 % — ABNORMAL LOW (ref 39.0–52.0)
Hemoglobin: 7.5 g/dL — ABNORMAL LOW (ref 13.0–17.0)
MCH: 27.2 pg (ref 26.0–34.0)
MCHC: 30.6 g/dL (ref 30.0–36.0)
MCV: 88.8 fL (ref 80.0–100.0)
Platelets: 215 10*3/uL (ref 150–400)
RBC: 2.76 MIL/uL — ABNORMAL LOW (ref 4.22–5.81)
RDW: 18.6 % — ABNORMAL HIGH (ref 11.5–15.5)
WBC: 6.9 10*3/uL (ref 4.0–10.5)
nRBC: 0.4 % — ABNORMAL HIGH (ref 0.0–0.2)

## 2022-02-28 MED ORDER — HYOSCYAMINE SULFATE 0.125 MG SL SUBL
0.1250 mg | SUBLINGUAL_TABLET | Freq: Four times a day (QID) | SUBLINGUAL | Status: DC | PRN
  Administered 2022-02-28 – 2022-03-02 (×5): 0.125 mg via SUBLINGUAL
  Filled 2022-02-28 (×7): qty 1

## 2022-02-28 MED ORDER — CHLORHEXIDINE GLUCONATE CLOTH 2 % EX PADS
6.0000 | MEDICATED_PAD | Freq: Every day | CUTANEOUS | Status: DC
  Administered 2022-02-28 – 2022-03-02 (×3): 6 via TOPICAL

## 2022-02-28 MED ORDER — GLUCERNA SHAKE PO LIQD
237.0000 mL | Freq: Three times a day (TID) | ORAL | Status: DC
  Administered 2022-02-28 – 2022-03-02 (×6): 237 mL via ORAL
  Filled 2022-02-28 (×11): qty 237

## 2022-02-28 NOTE — TOC Initial Note (Signed)
Transition of Care [facility]) - Initial/Assessment Note    Patient Details  Name: Nathan [name] MRN: [id number] Date of Birth: September 18, 1945  Transition of Care [facility]) CM/SW Contact:    Vassie Moselle, LCSW Phone Number: 02/28/2022, 1:24 PM  Clinical Narrative:                 Pt currently receiving HHPT/OT with [facility]. Pt will need resumption of care orders placed prior to discharge if this remains the discharge plan.   Expected Discharge Plan: [place] Barriers to Discharge: No Barriers Identified   Patient Goals and CMS Choice Patient states their goals for this hospitalization and ongoing recovery are:: Return home with home health   Choice offered to / list presented to : Patient  Expected Discharge Plan and Services Expected Discharge Plan: [place] In-house Referral: NA Discharge Planning Services: NA Post Acute Care Choice: Home Health, Durable Medical Equipment Living arrangements for the past 2 months: Single Family Home                 DME Arranged: N/A DME Agency: NA       HH Arranged: OT, PT [place] Agency: [facility]        Prior Living Arrangements/Services Living arrangements for the past 2 months: Single Family Home Lives with:: Adult Children, Spouse Patient language and need for interpreter reviewed:: Yes Do you feel safe going back to the place where you live?: Yes      Need for Family Participation in Patient Care: No (Comment) Care giver support system in place?: Yes (comment) Current home services: DME, Home OT, Home PT Criminal Activity/Legal Involvement Pertinent to Current Situation/Hospitalization: No - Comment as needed  Activities of Daily Living Home Assistive Devices/Equipment: Environmental consultant (specify type), Shower chair with back, Grab bars in shower, Cane (specify quad or straight) ADL Screening (condition at time of admission) Patient's cognitive ability adequate to safely complete daily  activities?: Yes Is the patient deaf or have difficulty hearing?: No Does the patient have difficulty seeing, even when wearing glasses/contacts?: No Does the patient have difficulty concentrating, remembering, or making decisions?: No Patient able to express need for assistance with ADLs?: Yes Does the patient have difficulty dressing or bathing?: No Independently performs ADLs?: Yes (appropriate for developmental age) Does the patient have difficulty walking or climbing stairs?: No Weakness of Legs: None Weakness of Arms/Hands: None  Permission Sought/Granted   Permission granted to share information with : No              Emotional Assessment Appearance:: Appears stated age Attitude/Demeanor/Rapport: Gracious Affect (typically observed): Accepting, Pleasant Orientation: : Oriented to Self, Oriented to Place, Oriented to  Time, Oriented to Situation Alcohol / Substance Use: Not Applicable Psych Involvement: No (comment)  Admission diagnosis:  Gross hematuria [R31.0] Symptomatic anemia [D64.9] ABLA (acute blood loss anemia) [D62] Patient Active Problem List   Diagnosis Date Noted   ABLA (acute blood loss anemia) 02/27/2022   Fever 01/24/2022   Acute blood loss anemia (ABLA) 01/24/2022   History of DVT (deep vein thrombosis) 01/12/2022   Gross hematuria 01/12/2022   Symptomatic anemia 01/11/2022   Acute DVT (deep venous thrombosis) ([place]) 12/09/2021   UTI (urinary tract infection) 12/09/2021   Essential hypertension 12/09/2021   Type 2 diabetes mellitus without complication, without long-term current use of insulin ([place]) 12/09/2021   Normocytic anemia 12/09/2021   Malignant neoplasm of prostate ([place]) 07/07/2019   Paroxysmal atrial fibrillation ([place])  06/05/2019   PCP:  [name], MD Pharmacy:   [facility] [address], [place] [address] Phone: [phone] Fax: [phone]  [place] [address] Phone: [phone] Fax: [phone]     Social Determinants of Health (SDOH) Interventions    Readmission Risk Interventions    02/28/2022    1:23 PM 02/14/2022    9:26 AM 12/11/2021    2:36 PM  Readmission Risk Prevention Plan  Post Dischage Appt   Complete  Medication Screening   Complete  Transportation Screening Complete Complete Complete  PCP or Specialist Appt within 5-7 Days  Complete   PCP or Specialist Appt within 3-5 Days Complete    Home Care Screening  Complete   Medication Review (RN CM)  Complete   HRI or Home Care Consult Complete    Social Work Consult for [place] Planning/Counseling Complete    Palliative Care Screening Not Applicable    Medication Review Press photographer) Complete

## 2022-02-28 NOTE — Progress Notes (Signed)
Patient ID: Nathan [name], male   DOB: [birth year], 76 y.o.   MRN: [id number]    Subjective: Some clots removed by nursing staff this morning.  S/P transfusion of 2 units PRBCs yesterday.  Objective: Vital signs in last 24 hours: Temp:  [97.8 F (36.6 C)-98.9 F (37.2 C)] 98.7 F (37.1 C) (08/30 0813) Pulse Rate:  [72-104] 104 (08/30 0942) Resp:  [14-22] 14 (08/30 0813) BP: (111-149)/(66-92) 127/84 (08/30 0942) SpO2:  [98 %-100 %] 100 % (08/30 0813) Weight:  [84.7 kg] 84.7 kg (08/29 1855)  Intake/Output from previous day: 08/29 0701 - 08/30 0700 In: 3338 [Blood:338] Out: 11300 [[id number]] Intake/Output this shift: Total I/O In: -  Out: 4100 [Urine:4100]  Physical Exam:  General: Alert and oriented GU: Urine draining mostly clear in tubing on mild to moderate CBI.  Lab Results: Recent Labs    02/27/22 1420 02/28/22 0518  HGB 6.7* 7.5*  HCT 23.3* 24.5*   BMET Recent Labs    02/27/22 1420 02/28/22 0518  NA 140 141  K 4.8 4.2  CL 109 111  CO2 24 26  GLUCOSE 121* 101*  BUN 13 12  CREATININE 0.96 0.92  CALCIUM 8.7* 8.5*     Studies/Results: No results found.  Assessment/Plan: 1) Recurrent radiation induced hematuria:  Continue to wean CBI and transfuse as needed.  If acute bleeding persists requiring ongoing transfusion, will consider cystoscopic intervention although urine looking improved today.  He is pending insurance approval for outpatient hyperbaric oxygen therapy, which is the only good option for trying to reduce his risk of recurrent bleeding.    LOS: 1 day   Dutch Gray 02/28/2022, 11:35 AM

## 2022-02-28 NOTE — Progress Notes (Signed)
[facility] PROGRESS NOTE   Nathan [name] ERX:[id number] DOB: Jan 30, 1946 DOA: 02/27/2022  PCP: [name], MD  Brief History/Interval Summary: 76 y.o. male with medical history significant of prostate cancer, following with urology, paroxysmal A-fib, chronic hematuria, hypertension, diet-controlled diabetes type 2,DVT who was sent from urology office to ED after he was found to be anemic.  Patient had recent history of cystoscopy with fulguration by urology in July and has had a Foley catheter since then.  He was admitted on August 15 with complaints of hematuria.  At that time urology was consulted and was started on continuous bladder irrigation followed by clearing of urine and was sent home on August 18 with plan to follow-up with urology.  Anticoagulation was held.  An IVC filter was placed.  Presented back with recurrence of hematuria.   Consultants: Urology  Procedures: Continuous bladder irrigation    Subjective/Interval History: Patient denies any abdominal pain nausea or vomiting.  No chest pain or shortness of breath.    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 Urology is following and managing.  Currently on continuous bladder irrigation. Based on previous records it looks like he is also status post left prostatic artery embolization in July.  This was done by interventional radiology.  Acute blood loss anemia He was transfused 2 units of PRBC 8/29. Hemoglobin noted to be 7.5 today.  Continue to monitor daily and transfuse if it drops below 7.  History of prostate cancer Followed by urology.  History of radiation treatment.  Has a history of cystoscopy with fulguration in July.  On finasteride.  Paroxysmal atrial fibrillation Rate controlled with metoprolol.  Anticoagulation was held during one of his previous hospitalization due to hematuria.  History of lower extremity DVT He was  diagnosed with bilateral lower extremity DVT in July. He has an IVC filter in situ. Not on anticoagulation as discussed above.  Essential hypertension Noted to be on just metoprolol in the hospital.  Apparently he is also on ACE inhibitor.  Blood pressure is reasonably well controlled.  Prediabetes HbA1c 5.1 in June.  Diet controlled.  DVT Prophylaxis: SCDs only Code Status: Full code Family Communication: Discussed with patient Disposition Plan: Hopefully return home in improved  Status is: Inpatient Remains inpatient appropriate because: Hematuria      Medications: Scheduled:  Chlorhexidine Gluconate Cloth  6 each Topical Daily   cyanocobalamin  1,000 mcg Oral Daily   feeding supplement (GLUCERNA SHAKE)  237 mL Oral TID BM   ferrous sulfate  325 mg Oral Q breakfast   folic acid  1 mg Oral Daily   magnesium oxide  400 mg Oral Daily   metoprolol succinate  100 mg Oral Daily   Continuous:  sodium chloride     sodium chloride     sodium chloride irrigation     PJK:[id number], gabapentin, morphine injection, oxyCODONE, polyethylene glycol  Antibiotics: Anti-infectives (From admission, onward)    None       Objective:  Vital Signs  Vitals:   02/28/22 0600 02/28/22 0700 02/28/22 0813 02/28/22 0942  BP:   114/70 127/84  Pulse:   88 (!) 104  Resp: (!) '21 20 14   '$ Temp:   98.7 F (37.1 C)   TempSrc:   Oral   SpO2:   100%   Weight:      Height:        Intake/Output Summary (Last 24 hours) at 02/28/2022 0947  Last data filed at 02/28/2022 0945 Gross per 24 hour  Intake 3338 ml  Output 13700 ml  Net -10362 ml   Filed Weights   02/27/22 1855  Weight: 84.7 kg    General appearance: Awake alert.  In no distress Resp: Clear to auscultation bilaterally.  Normal effort Cardio: S1-S2 is normal regular.  No S3-S4.  No rubs murmurs or bruit GI: Abdomen is soft.  Nontender nondistended.  Bowel sounds are present normal.  No masses organomegaly Extremities: No  edema.  Full range of motion of lower extremities. Neurologic: Alert and oriented x3.  No focal neurological deficits.    Lab Results:  Data Reviewed: I have personally reviewed following labs and reports of the imaging studies  CBC: Recent Labs  Lab 02/27/22 1420 02/28/22 0518  WBC 8.2 6.9  NEUTROABS 6.2  --   HGB 6.7* 7.5*  HCT 23.3* 24.5*  MCV 92.8 88.8  PLT 284 383    Basic Metabolic Panel: Recent Labs  Lab 02/27/22 1420 02/28/22 0518  NA 140 141  K 4.8 4.2  CL 109 111  CO2 24 26  GLUCOSE 121* 101*  BUN 13 12  CREATININE 0.96 0.92  CALCIUM 8.7* 8.5*    GFR: Estimated Creatinine Clearance: 74.8 mL/min (by C-G formula based on SCr of 0.92 mg/dL).  Liver Function Tests: Recent Labs  Lab 02/27/22 1420  AST 33  ALT 13  ALKPHOS 51  BILITOT 0.5  PROT 6.3*  ALBUMIN 3.1*     Coagulation Profile: Recent Labs  Lab 02/27/22 1420  INR 1.2     Radiology Studies: No results found.     LOS: 1 day   Nathan [name] [employer] on [email or web address]  02/28/2022, 9:47 AM

## 2022-03-01 DIAGNOSIS — I480 Paroxysmal atrial fibrillation: Secondary | ICD-10-CM | POA: Diagnosis not present

## 2022-03-01 DIAGNOSIS — R310 Gross hematuria: Secondary | ICD-10-CM | POA: Diagnosis not present

## 2022-03-01 DIAGNOSIS — D62 Acute posthemorrhagic anemia: Secondary | ICD-10-CM | POA: Diagnosis not present

## 2022-03-01 DIAGNOSIS — I10 Essential (primary) hypertension: Secondary | ICD-10-CM | POA: Diagnosis not present

## 2022-03-01 LAB — CBC
HCT: 21.6 % — ABNORMAL LOW (ref 39.0–52.0)
Hemoglobin: 6.5 g/dL — CL (ref 13.0–17.0)
MCH: 27.4 pg (ref 26.0–34.0)
MCHC: 30.1 g/dL (ref 30.0–36.0)
MCV: 91.1 fL (ref 80.0–100.0)
Platelets: 192 10*3/uL (ref 150–400)
RBC: 2.37 MIL/uL — ABNORMAL LOW (ref 4.22–5.81)
RDW: 19 % — ABNORMAL HIGH (ref 11.5–15.5)
WBC: 8.3 10*3/uL (ref 4.0–10.5)
nRBC: 0.2 % (ref 0.0–0.2)

## 2022-03-01 LAB — PREPARE RBC (CROSSMATCH)

## 2022-03-01 LAB — HEMOGLOBIN AND HEMATOCRIT, BLOOD
HCT: 23.7 % — ABNORMAL LOW (ref 39.0–52.0)
Hemoglobin: 7.3 g/dL — ABNORMAL LOW (ref 13.0–17.0)

## 2022-03-01 MED ORDER — SODIUM CHLORIDE 0.9% IV SOLUTION: Freq: Once | INTRAVENOUS | Status: AC

## 2022-03-01 MED ORDER — FUROSEMIDE 10 MG/ML IJ SOLN
20.0000 mg | Freq: Once | INTRAMUSCULAR | Status: AC
Start: 2022-03-01 — End: 2022-03-01
  Administered 2022-03-01: 20 mg via INTRAVENOUS
  Filled 2022-03-01: qty 2

## 2022-03-01 NOTE — Progress Notes (Signed)
Mobility Specialist - Progress Note   03/01/22 1000  Mobility  Activity Ambulated with assistance in hallway  Level of Assistance Standby assist, set-up cues, supervision of patient - no hands on  Assistive Device Front wheel walker  Distance Ambulated (ft) 250 ft  Activity Response Tolerated well  $Mobility charge 1 Mobility   Pt received in bed and agreed to mobility. No c/o pain nor discomfort during ambulation. Pt returned to bed with all needs met and call bell within reach.   Roderick Pee Mobility Specialist

## 2022-03-01 NOTE — Consult Note (Signed)
   [facility] Inpatient Consult   03/01/2022  Nathan [name] January 19, 1946 [id number]  [place] [place] [facility] [ACO] Patient: Medicare ACO REACH  PCP: Horald Pollen, [place] is currently not in the [facility] [employer]  *Remote coverage for patient at [facility] for length of stay and high risk for unplanned readmission   Primary Care Provider:  [name], MD, is not a  listed [facility] provider, patient showing in [facility] Roster with specialist, Lyman Bishop, MD, Cardiology, no PCP in [place] for care management noted.    Natividad Brood, RN BSN [facility] Liaison  [phone] business mobile phone Toll free office [phone]  Fax number: [phone] [email or web address]  .

## 2022-03-01 NOTE — Progress Notes (Signed)
[facility] PROGRESS NOTE   Nathan [name] INO:[id number] DOB: December 04, 1945 DOA: 02/27/2022  PCP: [name], MD  Brief History/Interval Summary: 76 y.o. male with medical history significant of prostate cancer, following with urology, paroxysmal A-fib, chronic hematuria, hypertension, diet-controlled diabetes type 2,DVT who was sent from urology office to ED after he was found to be anemic.  Patient had recent history of cystoscopy with fulguration by urology in July and has had a Foley catheter since then.  He was admitted on August 15 with complaints of hematuria.  At that time urology was consulted and was started on continuous bladder irrigation followed by clearing of urine and was sent home on August 18 with plan to follow-up with urology.  Anticoagulation was held.  An IVC filter was placed.  Presented back with recurrence of hematuria.   Consultants: Urology  Procedures: Continuous bladder irrigation    Subjective/Interval History: Patient denies any complaints this morning.  No abdominal pain nausea or vomiting.  Urine is still quite bloody.     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 Urology is following and managing.  Currently on continuous bladder irrigation. Based on previous records it looks like he is also status post left prostatic artery embolization in July.  This was done by interventional radiology. Urology is considering cystoscopy and fulguration tomorrow.  Acute blood loss anemia He was transfused 2 units of PRBC 8/29. Hemoglobin has dropped below 7 this morning.  He will be ordered another unit of PRBC today.  History of prostate cancer Followed by urology.  History of radiation treatment.  Has a history of cystoscopy with fulguration in July.  On finasteride.  Paroxysmal atrial fibrillation Rate controlled with metoprolol.  Anticoagulation was held during one of his previous  hospitalization due to hematuria.  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 He has an IVC filter in situ. Not on anticoagulation as discussed above.  Essential hypertension Noted to be on just metoprolol in the hospital.  Apparently he is also on ACE inhibitor prior to admission.  Blood pressure is reasonably well controlled.  Prediabetes HbA1c 5.1 in June.  Diet controlled.  DVT Prophylaxis: SCDs only Code Status: Full code Family Communication: Discussed with patient Disposition Plan: Hopefully return home in improved  Status is: Inpatient Remains inpatient appropriate because: Hematuria      Medications: Scheduled:  sodium chloride   Intravenous Once   Chlorhexidine Gluconate Cloth  6 each Topical Daily   cyanocobalamin  1,000 mcg Oral Daily   feeding supplement (GLUCERNA SHAKE)  237 mL Oral TID BM   ferrous sulfate  325 mg Oral Q breakfast   folic acid  1 mg Oral Daily   magnesium oxide  400 mg Oral Daily   metoprolol succinate  100 mg Oral Daily   Continuous:  sodium chloride     sodium chloride 75 mL/hr at 03/01/22 0627   sodium chloride irrigation     SJG:[id number], gabapentin, hyoscyamine, morphine injection, oxyCODONE, polyethylene glycol  Antibiotics: Anti-infectives (From admission, onward)    None       Objective:  Vital Signs  Vitals:   02/28/22 0942 02/28/22 1402 02/28/22 2000 03/01/22 0524  BP: 127/84 115/68 124/76 134/77  Pulse: (!) 104 81 83 78  Resp:  '16 16 16  '$ Temp:  97.9 F (36.6 C) 98.1 F (36.7 C) 98.1 F (36.7 C)  TempSrc:  Oral Oral  Oral  SpO2:  100% 99% 97%  Weight:      Height:        Intake/Output Summary (Last 24 hours) at 03/01/2022 1002 Last data filed at 03/01/2022 0949 Gross per 24 hour  Intake 21606.35 ml  Output 32850 ml  Net -11243.65 ml    Filed Weights   02/27/22 1855  Weight: 84.7 kg    General appearance: Awake alert.  In no distress Resp:  Clear to auscultation bilaterally.  Normal effort Cardio: S1-S2 is normal regular.  No S3-S4.  No rubs murmurs or bruit GI: Abdomen is soft.  Nontender nondistended.  Bowel sounds are present normal.  No masses organomegaly Extremities: No edema.  Full range of motion of lower extremities. Neurologic: Alert and oriented x3.  No focal neurological deficits.     Lab Results:  Data Reviewed: I have personally reviewed following labs and reports of the imaging studies  CBC: Recent Labs  Lab 02/27/22 1420 02/28/22 0518 03/01/22 0511  WBC 8.2 6.9 8.3  NEUTROABS 6.2  --   --   HGB 6.7* 7.5* 6.5*  HCT 23.3* 24.5* 21.6*  MCV 92.8 88.8 91.1  PLT 284 215 192     Basic Metabolic Panel: Recent Labs  Lab 02/27/22 1420 02/28/22 0518  NA 140 141  K 4.8 4.2  CL 109 111  CO2 24 26  GLUCOSE 121* 101*  BUN 13 12  CREATININE 0.96 0.92  CALCIUM 8.7* 8.5*     GFR: Estimated Creatinine Clearance: 74.8 mL/min (by C-G formula based on SCr of 0.92 mg/dL).  Liver Function Tests: Recent Labs  Lab 02/27/22 1420  AST 33  ALT 13  ALKPHOS 51  BILITOT 0.5  PROT 6.3*  ALBUMIN 3.1*      Coagulation Profile: Recent Labs  Lab 02/27/22 1420  INR 1.2      Radiology Studies: No results found.     LOS: 2 days   [place] [facility] Pager on [email or web address]  03/01/2022, 10:02 AM

## 2022-03-01 NOTE — Progress Notes (Signed)
Order gave to transfer patient to the 4th floor/ Urology floor.  Report called to to [facility], RN and patient transferred to room 1409.  [place]ia [place], RN

## 2022-03-01 NOTE — Progress Notes (Signed)
Patient ID: Nathan [name], male   DOB: [birth year], 76 y.o.   MRN: [id number]    Subjective: No complaints except periodic bladder spasms.  Objective: Vital signs in last 24 hours: Temp:  [97.9 F (36.6 C)-98.7 F (37.1 C)] 98.1 F (36.7 C) (08/31 0524) Pulse Rate:  [78-104] 78 (08/31 0524) Resp:  [14-16] 16 (08/31 0524) BP: (114-134)/(68-84) 134/77 (08/31 0524) SpO2:  [97 %-100 %] 97 % (08/31 0524)  Intake/Output from previous day: 08/30 0701 - 08/31 0700 In: 21606.4 [I.V.:606.4] Out: 31400 [Urine:31400] Intake/Output this shift: Total I/O In: -  Out: 800 [Urine:800]  Physical Exam:  General: Alert and oriented GU: Urine pink on slow to medium CBI  Lab Results: Recent Labs    02/27/22 1420 02/28/22 0518 03/01/22 0511  HGB 6.7* 7.5* 6.5*  HCT 23.3* 24.5* 21.6*   BMET Recent Labs    02/27/22 1420 02/28/22 0518  NA 140 141  K 4.8 4.2  CL 109 111  CO2 24 26  GLUCOSE 121* 101*  BUN 13 12  CREATININE 0.96 0.92  CALCIUM 8.7* 8.5*     Studies/Results: No results found.  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  A few clots removed this morning when I hand irrigated.  CBI continued.  Will likely need transfusion today.  Will tentatively plan for OR tomorrow for cystoscopy and fulguration to see if there is a way to slow down bleeding.  Keep NPO after MN.  If improving tomorrow, will cancel procedure.  Otherwise, will need outpatient hyperbaric oxygen therapy ultimately to reduce risk of recurrent bleeding.  Awaiting insurance approval currently.   LOS: 2 days   Dutch Gray 03/01/2022, 7:27 AM

## 2022-03-02 ENCOUNTER — Encounter (HOSPITAL_COMMUNITY): Payer: Self-pay | Admitting: Internal Medicine

## 2022-03-02 ENCOUNTER — Inpatient Hospital Stay (HOSPITAL_COMMUNITY): Payer: Medicare Other | Admitting: Anesthesiology

## 2022-03-02 ENCOUNTER — Encounter (HOSPITAL_COMMUNITY)
Admission: EM | Disposition: A | Discharge status: Home Health | Payer: Self-pay | Source: Home / Self Care | Attending: Internal Medicine

## 2022-03-02 DIAGNOSIS — R319 Hematuria, unspecified: Secondary | ICD-10-CM | POA: Diagnosis not present

## 2022-03-02 DIAGNOSIS — E119 Type 2 diabetes mellitus without complications: Secondary | ICD-10-CM | POA: Diagnosis not present

## 2022-03-02 DIAGNOSIS — D62 Acute posthemorrhagic anemia: Secondary | ICD-10-CM | POA: Diagnosis not present

## 2022-03-02 DIAGNOSIS — Z87891 Personal history of nicotine dependence: Secondary | ICD-10-CM | POA: Diagnosis not present

## 2022-03-02 DIAGNOSIS — I10 Essential (primary) hypertension: Secondary | ICD-10-CM

## 2022-03-02 DIAGNOSIS — R310 Gross hematuria: Secondary | ICD-10-CM | POA: Diagnosis not present

## 2022-03-02 DIAGNOSIS — I480 Paroxysmal atrial fibrillation: Secondary | ICD-10-CM | POA: Diagnosis not present

## 2022-03-02 HISTORY — PX: CYSTOSCOPY WITH FULGERATION: SHX6638

## 2022-03-02 LAB — BASIC METABOLIC PANEL
Anion gap: 7 (ref 5–15)
BUN: 10 mg/dL (ref 8–23)
CO2: 26 mmol/L (ref 22–32)
Calcium: 8.8 mg/dL — ABNORMAL LOW (ref 8.9–10.3)
Chloride: 111 mmol/L (ref 98–111)
Creatinine, Ser: 1.03 mg/dL (ref 0.61–1.24)
GFR, Estimated: >60 mL/min (ref >60)
Glucose, Bld: 102 mg/dL — ABNORMAL HIGH (ref 70–99)
Potassium: 4.1 mmol/L (ref 3.5–5.1)
Sodium: 144 mmol/L (ref 135–145)

## 2022-03-02 LAB — CBC
HCT: 24.5 % — ABNORMAL LOW (ref 39.0–52.0)
Hemoglobin: 7.7 g/dL — ABNORMAL LOW (ref 13.0–17.0)
MCH: 28.3 pg (ref 26.0–34.0)
MCHC: 31.4 g/dL (ref 30.0–36.0)
MCV: 90.1 fL (ref 80.0–100.0)
Platelets: 182 10*3/uL (ref 150–400)
RBC: 2.72 MIL/uL — ABNORMAL LOW (ref 4.22–5.81)
RDW: 17.2 % — ABNORMAL HIGH (ref 11.5–15.5)
WBC: 7.3 10*3/uL (ref 4.0–10.5)
nRBC: 0.5 % — ABNORMAL HIGH (ref 0.0–0.2)

## 2022-03-02 LAB — PREPARE RBC (CROSSMATCH)

## 2022-03-02 SURGERY — CYSTOSCOPY, WITH BLADDER FULGURATION
Anesthesia: General

## 2022-03-02 MED ORDER — FENTANYL CITRATE PF 50 MCG/ML IJ SOSY: 25.0000 ug | PREFILLED_SYRINGE | INTRAMUSCULAR | Status: DC | PRN

## 2022-03-02 MED ORDER — DEXAMETHASONE SODIUM PHOSPHATE 10 MG/ML IJ SOLN
INTRAMUSCULAR | Status: AC
  Filled 2022-03-02: qty 1

## 2022-03-02 MED ORDER — LIDOCAINE HCL (PF) 2 % IJ SOLN
INTRAMUSCULAR | Status: AC
  Filled 2022-03-02: qty 5

## 2022-03-02 MED ORDER — CHLORHEXIDINE GLUCONATE 0.12 % MT SOLN
15.0000 mL | Freq: Once | OROMUCOSAL | Status: AC
  Administered 2022-03-02: 15 mL via OROMUCOSAL

## 2022-03-02 MED ORDER — PHENYLEPHRINE 80 MCG/ML (10ML) SYRINGE FOR IV PUSH (FOR BLOOD PRESSURE SUPPORT)
PREFILLED_SYRINGE | INTRAVENOUS | Status: DC | PRN
  Administered 2022-03-02 (×2): 80 ug via INTRAVENOUS

## 2022-03-02 MED ORDER — ACETAMINOPHEN 10 MG/ML IV SOLN: 1000.0000 mg | Freq: Once | INTRAVENOUS | Status: DC | PRN

## 2022-03-02 MED ORDER — PROPOFOL 10 MG/ML IV BOLUS
INTRAVENOUS | Status: AC
  Filled 2022-03-02: qty 20

## 2022-03-02 MED ORDER — STERILE WATER FOR IRRIGATION IR SOLN
Status: DC | PRN
  Administered 2022-03-02: 3000 mL via INTRAVESICAL

## 2022-03-02 MED ORDER — SODIUM CHLORIDE 0.9 % IV SOLN
2.0000 g | Freq: Once | INTRAVENOUS | Status: AC
  Administered 2022-03-02: 2 g via INTRAVENOUS

## 2022-03-02 MED ORDER — LACTATED RINGERS IV SOLN: INTRAVENOUS | Status: DC

## 2022-03-02 MED ORDER — SODIUM CHLORIDE 0.9 % IR SOLN
Status: DC | PRN
  Administered 2022-03-02: 3000 mL via INTRAVESICAL

## 2022-03-02 MED ORDER — DEXAMETHASONE SODIUM PHOSPHATE 10 MG/ML IJ SOLN
INTRAMUSCULAR | Status: DC | PRN
  Administered 2022-03-02: 5 mg via INTRAVENOUS

## 2022-03-02 MED ORDER — PROPOFOL 10 MG/ML IV BOLUS
INTRAVENOUS | Status: DC | PRN
  Administered 2022-03-02 (×2): 40 mg via INTRAVENOUS
  Administered 2022-03-02: 200 mg via INTRAVENOUS

## 2022-03-02 MED ORDER — LIDOCAINE 2% (20 MG/ML) 5 ML SYRINGE
INTRAMUSCULAR | Status: DC | PRN
  Administered 2022-03-02: 60 mg via INTRAVENOUS

## 2022-03-02 MED ORDER — FENTANYL CITRATE (PF) 100 MCG/2ML IJ SOLN
INTRAMUSCULAR | Status: AC
  Filled 2022-03-02: qty 2

## 2022-03-02 MED ORDER — 0.9 % SODIUM CHLORIDE (POUR BTL) OPTIME
TOPICAL | Status: DC | PRN
  Administered 2022-03-02: 1000 mL

## 2022-03-02 MED ORDER — FENTANYL CITRATE (PF) 100 MCG/2ML IJ SOLN
INTRAMUSCULAR | Status: DC | PRN
Start: 2022-03-02 — End: 2022-03-02
  Administered 2022-03-02: 25 ug via INTRAVENOUS
  Administered 2022-03-02: 50 ug via INTRAVENOUS
  Administered 2022-03-02: 25 ug via INTRAVENOUS
  Administered 2022-03-02 (×2): 50 ug via INTRAVENOUS

## 2022-03-02 MED ORDER — ONDANSETRON HCL 4 MG/2ML IJ SOLN
INTRAMUSCULAR | Status: DC | PRN
  Administered 2022-03-02: 4 mg via INTRAVENOUS

## 2022-03-02 MED ORDER — AMISULPRIDE (ANTIEMETIC) 5 MG/2ML IV SOLN: 10.0000 mg | Freq: Once | INTRAVENOUS | Status: DC | PRN

## 2022-03-02 MED ORDER — ONDANSETRON HCL 4 MG/2ML IJ SOLN
INTRAMUSCULAR | Status: AC
  Filled 2022-03-02: qty 2

## 2022-03-02 MED ORDER — SODIUM CHLORIDE 0.9 % IV SOLN
INTRAVENOUS | Status: AC
  Filled 2022-03-02: qty 20

## 2022-03-02 MED ORDER — ONDANSETRON HCL 4 MG/2ML IJ SOLN: 4.0000 mg | Freq: Once | INTRAMUSCULAR | Status: DC | PRN

## 2022-03-02 SURGICAL SUPPLY — 19 items
BAG URINE DRAIN 2000ML AR STRL (UROLOGICAL SUPPLIES) IMPLANT
BAG URO CATCHER STRL LF (MISCELLANEOUS) ×1 IMPLANT
CATH HEMA 3WAY 30CC 24FR COUDE (CATHETERS) IMPLANT
DRAPE FOOT SWITCH (DRAPES) ×1 IMPLANT
ELECT REM PT RETURN 15FT ADLT (MISCELLANEOUS) ×1 IMPLANT
GLOVE SURG LX 7.5 STRW (GLOVE) ×1
GLOVE SURG LX STRL 7.5 STRW (GLOVE) ×1 IMPLANT
GOWN STRL REUS W/ TWL XL LVL3 (GOWN DISPOSABLE) ×1 IMPLANT
GOWN STRL REUS W/TWL XL LVL3 (GOWN DISPOSABLE) ×1
KIT TURNOVER KIT A (KITS) IMPLANT
LOOP CUT BIPOLAR 24F LRG (ELECTROSURGICAL) IMPLANT
MANIFOLD NEPTUNE II (INSTRUMENTS) ×1 IMPLANT
PACK CYSTO (CUSTOM PROCEDURE TRAY) ×1 IMPLANT
PENCIL SMOKE EVACUATOR (MISCELLANEOUS) IMPLANT
SYR 30ML LL (SYRINGE) IMPLANT
SYR TOOMEY IRRIG 70ML (MISCELLANEOUS) ×1
SYRINGE TOOMEY IRRIG 70ML (MISCELLANEOUS) IMPLANT
TUBING CONNECTING 10 (TUBING) ×1 IMPLANT
TUBING UROLOGY SET (TUBING) ×1 IMPLANT

## 2022-03-02 NOTE — Anesthesia Procedure Notes (Signed)
Procedure Name: LMA Insertion Date/Time: 03/02/2022 12:15 PM  Performed by: Lollie Sails, CRNAPre-anesthesia Checklist: Patient identified, Emergency Drugs available, Suction available, Patient being monitored and Timeout performed Patient Re-evaluated:Patient Re-evaluated prior to induction Oxygen Delivery Method: Circle system utilized Preoxygenation: Pre-oxygenation with 100% oxygen Induction Type: IV induction Ventilation: Mask ventilation without difficulty LMA: LMA inserted LMA Size: 5.0 Number of attempts: 1 Placement Confirmation: positive ETCO2 and breath sounds checked- equal and bilateral Tube secured with: Tape Dental Injury: Teeth and Oropharynx as per pre-operative assessment

## 2022-03-02 NOTE — Anesthesia Preprocedure Evaluation (Signed)
Anesthesia Evaluation    Reviewed: Allergy & Precautions, Patient's Chart, lab work & pertinent test results  Airway Mallampati: II  TM Distance: >3 FB Neck ROM: Full    Dental  (+) Edentulous Upper, Missing   Pulmonary neg pulmonary ROS, former smoker,    Pulmonary exam normal        Cardiovascular hypertension, Pt. on medications and Pt. on home beta blockers + DVT  Normal cardiovascular exam+ dysrhythmias Atrial Fibrillation      Neuro/Psych negative neurological ROS  negative psych ROS   GI/Hepatic negative GI ROS, Neg liver ROS,   Endo/Other  diabetes  Renal/GU negative Renal ROS     Musculoskeletal negative musculoskeletal ROS (+)   Abdominal   Peds  Hematology  (+) Blood dyscrasia, anemia ,   Anesthesia Other Findings hematuria  Reproductive/Obstetrics                             Anesthesia Physical Anesthesia Plan  ASA: 3  Anesthesia Plan: General   Post-op Pain Management:    Induction: Intravenous  PONV Risk Score and Plan: 2 and Ondansetron, Dexamethasone and Treatment may vary due to age or medical condition  Airway Management Planned: LMA  Additional Equipment:   Intra-op Plan:   Post-operative Plan: Extubation in OR  Informed Consent: I have reviewed the patients History and Physical, chart, labs and discussed the procedure including the risks, benefits and alternatives for the proposed anesthesia with the patient or authorized representative who has indicated his/her understanding and acceptance.     Dental advisory given  Plan Discussed with: CRNA  Anesthesia Plan Comments:         Anesthesia Quick Evaluation

## 2022-03-02 NOTE — Op Note (Signed)
Preoperative diagnosis: Radiation cystitis/prostatitis  Postoperative diagnosis: Radiation cystitis  Procedures: 1.  Cystoscopy 2.  Clot evacuation 3.  Fulguration of bleeding sites within the bladder due to radiation cystitis  Surgeon: Roxy Horseman, Brooke Bonito MD  Anesthesia: General  Complications: None  EBL: Minimal  Specimens: None  Intraoperative findings: On cystoscopic evaluation, he was noted to have an extremely large prostate with bilateral lobe hypertrophy.  There was no bleeding noted from the prostatic urethra.  Inspection of bladder revealed numerous sites throughout the bladder that were oozing consistent with radiation cystitis changes.  Indication: Nathan [name] is a 76 year old gentleman with recurrent hematuria.  He has a history of prostate cancer and is status post radiation therapy in 2021.  He has now been hospitalized multiple times recently and required blood transfusion.  He has required 4 units during his current hospitalization.  As such, he was counseled regarding options and elected to proceed with the above surgical procedures.  The potential risks, complications, and expected recovery process was discussed in detail.  Informed consent was obtained.  Description of procedure: The patient was taken to the operating room and a general anesthetic was administered.  He was given preoperative antibiotics, placed in the dorsolithotomy position, and prepped and draped in usual sterile fashion.  Next, a preoperative timeout was performed.  Cystourethroscopy was performed using a 28 French resectoscope.  This revealed a normal anterior urethra.  Aside from a very large prostate with bilateral lobe hypertrophy, there was no active bleeding noted.  Inspection of the bladder immediately revealed the indwelling suprapubic tube and associated balloon.  There were numerous bleeding sites throughout the bladder consistent with diffuse radiation cystitis.  There was also a medium  amount of clot within the bladder.  This was evacuated using a Toomey syringe.  Reinspection revealed the active bleeding sites.  Numerous areas were then fulgurated using bipolar cautery loop.  It was estimated that over 4 cm of the bladder mucosa required fulguration.  The bladder was then emptied and reinspected.  Hemostasis was ensured.  It was felt that he would be best served without a urethral catheter replaced considering there was no bleeding in the prostatic urethra and in an attempt to avoid further irritation of his bladder.  He was left with a suprapubic tube to drainage.  The patient tolerated the procedure well without complications.  He was able to be awakened and transferred to recovery in satisfactory condition.

## 2022-03-02 NOTE — Care Management Important Message (Signed)
Important Message  Patient Details IM Letter given to the Patient. Name: Nathan [name] MRN: [id number] Date of Birth: 11/12/1945   Medicare Important Message Given:  Yes     Kerin Salen 03/02/2022, 11:59 AM

## 2022-03-02 NOTE — Progress Notes (Signed)
Day of Surgery Subjective: ***  Objective: Vital signs in last 24 hours: Temp:  [97.9 F (36.6 C)-98.5 F (36.9 C)] 97.9 F (36.6 C) (09/01 1355) Pulse Rate:  [64-86] 64 (09/01 1355) Resp:  [11-18] 18 (09/01 1355) BP: (122-161)/(68-95) 161/85 (09/01 1355) SpO2:  [96 %-100 %] 100 % (09/01 1355) Weight:  [84.7 kg] 84.7 kg (09/01 1134)  Intake/Output from previous day: 08/31 0701 - 09/01 0700 In: 21892 [I.V.:1750; Blood:642] Out: 22025 [Urine:22400] Intake/Output this shift: Total I/O In: 4270 [I.V.:953; Other:6000; IV Piggyback:100] Out: 7000 [Urine:7000]  Physical Exam:  General: Alert and oriented CV: RRR Lungs: Clear Abdomen: Soft, ND, ATTP; inc c/d/I*** Ext: NT, No erythema  Lab Results: Recent Labs    03/01/22 0511 03/01/22 1710 03/02/22 0530  HGB 6.5* 7.3* 7.7*  HCT 21.6* 23.7* 24.5*   BMET Recent Labs    02/28/22 0518 03/02/22 0530  NA 141 144  K 4.2 4.1  CL 111 111  CO2 26 26  GLUCOSE 101* 102*  BUN 12 10  CREATININE 0.92 1.03  CALCIUM 8.5* 8.8*     Studies/Results: No results found.  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 Saloma Cadena MD 03/02/2022, 3:54 PM [facility]  Pager: [phone]

## 2022-03-02 NOTE — Transfer of Care (Signed)
Immediate Anesthesia Transfer of Care Note  Patient: Nathan [name]  Procedure(s) Performed: CYSTOSCOPY, CLOT Oxford OF BLADDER  Patient Location: PACU  Anesthesia Type:General  Level of Consciousness: sedated and responds to stimulation  Airway & Oxygen Therapy: Patient Spontanous Breathing and Patient connected to face mask oxygen  Post-op Assessment: Report given to RN and Post -op Vital signs reviewed and stable  Post vital signs: Reviewed and stable  Last Vitals:  Vitals Value Taken Time  BP 143/72 03/02/22 1258  Temp    Pulse 67 03/02/22 1300  Resp 13 03/02/22 1300  SpO2 100 % 03/02/22 1300  Vitals shown include unvalidated device data.  Last Pain:  Vitals:   03/02/22 1134  TempSrc:   PainSc: 0-No pain         Complications: No notable events documented.

## 2022-03-02 NOTE — Progress Notes (Signed)
Patient ID: Nathan [name], male   DOB: 12/07/1945, 76 y.o.   MRN: [id number]    Subjective: No new complaints.  Objective: Vital signs in last 24 hours: Temp:  [97.9 F (36.6 C)-98.3 F (36.8 C)] 98.3 F (36.8 C) (09/01 0513) Pulse Rate:  [74-88] 76 (09/01 0513) Resp:  [16-18] 18 (09/01 0513) BP: (104-143)/(64-77) 122/74 (09/01 0513) SpO2:  [97 %-100 %] 99 % (09/01 0513)  Intake/Output from previous day: 08/31 0701 - 09/01 0700 In: 20392 [I.V.:1750; Blood:642] Out: 40086 [Urine:22400] Intake/Output this shift: No intake/output data recorded.  Physical Exam:  General: Alert and oriented GU: Urine red. CBI was clamped off this morning.  Multiple small clots hand irrigated out. CBI restarted.  Lab Results: Recent Labs    03/01/22 0511 03/01/22 1710 03/02/22 0530  HGB 6.5* 7.3* 7.7*  HCT 21.6* 23.7* 24.5*   BMET Recent Labs    02/28/22 0518 03/02/22 0530  NA 141 144  K 4.2 4.1  CL 111 111  CO2 26 26  GLUCOSE 101* 102*  BUN 12 10  CREATININE 0.92 1.03  CALCIUM 8.5* 8.8*     Studies/Results: No results found.  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  Still with evidence of significant hematuria.  Will plan to proceed to the OR today for cystoscopy, clot evacuation, and possible fulguration.  I did touch base with the wound center yesterday and they are working on getting him scheduled and approved for hyperbaric oxygen therapy but it will likely be 2-3 weeks.  Hope to control acute bleeding to allow him to be discharged and then get to hyperbaric treatment.   LOS: 3 days   Dutch Gray 03/02/2022, 7:21 AM

## 2022-03-02 NOTE — Progress Notes (Signed)
[facility] PROGRESS NOTE   Nathan [name] WJX:[id number] DOB: 01-Oct-1945 DOA: 02/27/2022  PCP: [name], MD  Brief History/Interval Summary: 76 y.o. male with medical history significant of prostate cancer, following with urology, paroxysmal A-fib, chronic hematuria, hypertension, diet-controlled diabetes type 2,DVT who was sent from urology office to ED after he was found to be anemic.  Patient had recent history of cystoscopy with fulguration by urology in July and has had a Foley catheter since then.  He was admitted on August 15 with complaints of hematuria.  At that time urology was consulted and was started on continuous bladder irrigation followed by clearing of urine and was sent home on August 18 with plan to follow-up with urology.  Anticoagulation was held.  An IVC filter was placed.  Presented back with recurrence of hematuria.   Consultants: Urology  Procedures:  Continuous bladder irrigation  Plan is for cystoscopy clot evacuation and possible fulguration later today    Subjective/Interval History: No abdominal pain but has been experiencing some bladder spasms.  Continues to see blood in his urine.  Denies any nausea vomiting.     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 Urology is following and managing.  Currently on continuous bladder irrigation. Based on previous records it looks like he is also status post left prostatic artery embolization in July.  This was done by interventional radiology. Plan is for cystoscopy and possible fulguration later today.  Acute blood loss anemia He was transfused 2 units of PRBC 8/29. Hemoglobin dropped below 7 on 8/31.  He was transfused 1 unit of PRBC and then required another unit after there is no significant rise in his posttransfusion hemoglobin.  Continue to monitor.   May need additional blood transfusion but if he does undergo fulguration and  control of bleeding today he may be able to avoid further transfusions.    History of prostate cancer Followed by urology.  History of radiation treatment.  Has a history of cystoscopy with fulguration in July.  On finasteride.  Paroxysmal atrial fibrillation Rate controlled with metoprolol.  Anticoagulation was held during one of his previous hospitalization due to hematuria.  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 He has an IVC filter in situ. Not on anticoagulation as discussed above.  Essential hypertension Noted to be on just metoprolol in the hospital.  Apparently he is also on ACE inhibitor prior to admission.  Blood pressure is reasonably well controlled.  Prediabetes HbA1c 5.1 in June.  Diet controlled.  DVT Prophylaxis: SCDs  Code Status: Full code Family Communication: Discussed with patient Disposition Plan: Hopefully return home when improved  Status is: Inpatient Remains inpatient appropriate because: Hematuria      Medications: Scheduled:  Chlorhexidine Gluconate Cloth  6 each Topical Daily   cyanocobalamin  1,000 mcg Oral Daily   feeding supplement (GLUCERNA SHAKE)  237 mL Oral TID BM   ferrous sulfate  325 mg Oral Q breakfast   folic acid  1 mg Oral Daily   magnesium oxide  400 mg Oral Daily   metoprolol succinate  100 mg Oral Daily   Continuous:  sodium chloride     sodium chloride 75 mL/hr at 03/02/22 0533   sodium chloride irrigation     OZH:YQMVHQIONGEXB, gabapentin, hyoscyamine, morphine injection, oxyCODONE, polyethylene glycol  Antibiotics: Anti-infectives (From admission, onward)    None  Objective:  Vital Signs  Vitals:   03/02/22 0115 03/02/22 0143 03/02/22 0343 03/02/22 0513  BP: 137/69 135/71 129/68 122/74  Pulse: 81 80 74 76  Resp: '16 16 16 18  '$ Temp: 98.1 F (36.7 C) 98.1 F (36.7 C) 98.1 F (36.7 C) 98.3 F (36.8 C)  TempSrc: Oral Oral Oral Oral  SpO2: 98% 98%  97% 99%  Weight:      Height:        Intake/Output Summary (Last 24 hours) at 03/02/2022 0920 Last data filed at 03/02/2022 0533 Gross per 24 hour  Intake 21891.99 ml  Output 19400 ml  Net 2491.99 ml    Filed Weights   02/27/22 1855  Weight: 84.7 kg    General appearance: Awake alert.  In no distress Resp: Clear to auscultation bilaterally.  Normal effort Cardio: S1-S2 is normal regular.  No S3-S4.  No rubs murmurs or bruit GI: Abdomen is soft.  Nontender nondistended.  Bowel sounds are present normal.  No masses organomegaly Extremities: No edema.  Full range of motion of lower extremities. Neurologic: Alert and oriented x3.  No focal neurological deficits.     Lab Results:  Data Reviewed: I have personally reviewed following labs and reports of the imaging studies  CBC: Recent Labs  Lab 02/27/22 1420 02/28/22 0518 03/01/22 0511 03/01/22 1710 03/02/22 0530  WBC 8.2 6.9 8.3  --  7.3  NEUTROABS 6.2  --   --   --   --   HGB 6.7* 7.5* 6.5* 7.3* 7.7*  HCT 23.3* 24.5* 21.6* 23.7* 24.5*  MCV 92.8 88.8 91.1  --  90.1  PLT 284 215 192  --  182     Basic Metabolic Panel: Recent Labs  Lab 02/27/22 1420 02/28/22 0518 03/02/22 0530  NA 140 141 144  K 4.8 4.2 4.1  CL 109 111 111  CO2 '24 26 26  '$ GLUCOSE 121* 101* 102*  BUN '13 12 10  '$ CREATININE 0.96 0.92 1.03  CALCIUM 8.7* 8.5* 8.8*     GFR: Estimated Creatinine Clearance: 66.8 mL/min (by C-G formula based on SCr of 1.03 mg/dL).  Liver Function Tests: Recent Labs  Lab 02/27/22 1420  AST 33  ALT 13  ALKPHOS 51  BILITOT 0.5  PROT 6.3*  ALBUMIN 3.1*      Coagulation Profile: Recent Labs  Lab 02/27/22 1420  INR 1.2      Radiology Studies: No results found.     LOS: 3 days   Mikaelyn Arthurs [employer] on [email or web address]  03/02/2022, 9:20 AM

## 2022-03-02 NOTE — TOC Progression Note (Signed)
Transition of Care [facility]) - Progression Note    Patient Details  Name: Nathan [name] MRN: [id number] Date of Birth: 10-29-1945  Transition of [facility]) CM/SW Contact  Shekina Cordell, Juliann Pulse, RN Phone Number: 03/02/2022, 1:32 PM  Clinical Narrative: Active w/[facility] HHPT/OT.      Expected Discharge Plan: [place] Barriers to Discharge: Continued Medical Work up  Expected Discharge Plan and Services Expected Discharge Plan: [place] In-house Referral: NA Discharge Planning Services: NA Post Acute Care Choice: Home Health, Durable Medical Equipment Living arrangements for the past 2 months: Single Family Home                 DME Arranged: N/A DME Agency: NA       HH Arranged: OT, PT [place] Agency: [facility]         Social Determinants of Health (SDOH) Interventions    Readmission Risk Interventions    02/28/2022    1:23 PM 02/14/2022    9:26 AM 12/11/2021    2:36 PM  Readmission Risk Prevention Plan  Post Dischage Appt   Complete  Medication Screening   Complete  Transportation Screening Complete Complete Complete  PCP or Specialist Appt within 5-7 Days  Complete   PCP or Specialist Appt within 3-5 Days Complete    Home Care Screening  Complete   Medication Review (RN CM)  Complete   HRI or Home Care Consult Complete    Social Work Consult for [place] Planning/Counseling Complete    Palliative Care Screening Not Applicable    Medication Review Press photographer) Complete

## 2022-03-03 DIAGNOSIS — R310 Gross hematuria: Secondary | ICD-10-CM | POA: Diagnosis not present

## 2022-03-03 LAB — BPAM RBC
Blood Product Expiration Date: 202309202359
Blood Product Expiration Date: 202309202359
Blood Product Expiration Date: 202309222359
Blood Product Expiration Date: 202309222359
ISSUE DATE / TIME: 202308291746
ISSUE DATE / TIME: 202308292107
ISSUE DATE / TIME: 202308311150
ISSUE DATE / TIME: 202309010120
Unit Type and Rh: 7300
Unit Type and Rh: 7300
Unit Type and Rh: 7300
Unit Type and Rh: 7300

## 2022-03-03 LAB — TYPE AND SCREEN
ABO/RH(D): B POS
Antibody Screen: NEGATIVE
Unit division: 0
Unit division: 0
Unit division: 0
Unit division: 0

## 2022-03-03 LAB — CBC
HCT: 24.3 % — ABNORMAL LOW (ref 39.0–52.0)
Hemoglobin: 7.5 g/dL — ABNORMAL LOW (ref 13.0–17.0)
MCH: 28.6 pg (ref 26.0–34.0)
MCHC: 30.9 g/dL (ref 30.0–36.0)
MCV: 92.7 fL (ref 80.0–100.0)
Platelets: 208 10*3/uL (ref 150–400)
RBC: 2.62 MIL/uL — ABNORMAL LOW (ref 4.22–5.81)
RDW: 18.3 % — ABNORMAL HIGH (ref 11.5–15.5)
WBC: 5.6 10*3/uL (ref 4.0–10.5)
nRBC: 0 % (ref 0.0–0.2)

## 2022-03-03 MED ORDER — OXYCODONE HCL 5 MG PO CAPS: 5.0000 mg | ORAL_CAPSULE | Freq: Four times a day (QID) | ORAL | 0 refills | Status: DC | PRN

## 2022-03-03 MED ORDER — HYOSCYAMINE SULFATE 0.125 MG SL SUBL: 0.1250 mg | SUBLINGUAL_TABLET | Freq: Four times a day (QID) | SUBLINGUAL | 0 refills | Status: DC | PRN

## 2022-03-03 NOTE — Progress Notes (Signed)
Pt given discharge teaching and provided leg bag for pt suprapubic cath per pt request for transport.  IV removed.  Belongings given to pt - cell phone and clothes.  Pt taken to front entrance in wheelchair.  Transportation provided by daughter.

## 2022-03-03 NOTE — TOC Transition Note (Signed)
Transition of Care [facility]) - CM/SW Discharge Note   Patient Details  Name: Nathan [name] MRN: [id number] Date of Birth: 10-06-1945  Transition of [facility]) CM/SW Contact:  Ross Ludwig, LCSW Phone Number: 03/03/2022, 12:17 PM   Clinical Narrative:     Patient will be going home with home health PT and OT,  through [facility].  CSW signing off please reconsult with any other social work needs, home health agency has been notified of planned discharge.    Final next level of care: [place] Barriers to Discharge: Barriers Resolved   Patient Goals and CMS Choice Patient states their goals for this hospitalization and ongoing recovery are:: To return back home with home health. CMS Medicare.gov Compare Post Acute Care list provided to:: Patient Represenative (must comment) Choice offered to / list presented to : Patient  Discharge Placement                       Discharge Plan and Services In-house Referral: NA Discharge Planning Services: NA Post Acute Care Choice: Home Health, Durable Medical Equipment          DME Arranged: N/A DME Agency: NA       HH Arranged: PT, OT HH Agency: [facility] Date [place]: 03/03/22 Time HH Agency Contacted: [id number]    Social Determinants of Health (SDOH) Interventions     Readmission Risk Interventions    02/28/2022    1:23 PM 02/14/2022    9:26 AM 12/11/2021    2:36 PM  Readmission Risk Prevention Plan  Post Dischage Appt   Complete  Medication Screening   Complete  Transportation Screening Complete Complete Complete  PCP or Specialist Appt within 5-7 Days  Complete   PCP or Specialist Appt within 3-5 Days Complete    Home Care Screening  Complete   Medication Review (RN CM)  Complete   HRI or Home Care Consult Complete    Social Work Consult for [place] Planning/Counseling Complete    Palliative Care Screening Not Applicable    Medication Review Press photographer) Complete

## 2022-03-03 NOTE — Discharge Summary (Signed)
[facility]  Physician Discharge Summary   Patient ID: Nathan [name] MRN: [id number] DOB/AGE: 1946-01-17 76 y.o.  Admit date: 02/27/2022 Discharge date: 03/03/2022    PCP: [name], MD  DISCHARGE DIAGNOSES:    Acute blood loss anemia (ABLA)   Gross hematuria   Paroxysmal atrial fibrillation (HCC)   Essential hypertension   Malignant neoplasm of prostate (HCC)   ABLA (acute blood loss anemia)   RECOMMENDATIONS FOR OUTPATIENT FOLLOW UP: Follow-up with urology as instructed by them   Home Health: None Equipment/Devices: None  CODE STATUS: Full code  DISCHARGE CONDITION: fair  Diet recommendation: As before  INITIAL HISTORY: 76 y.o. male with medical history significant of prostate cancer, following with urology, paroxysmal A-fib, chronic hematuria, hypertension, diet-controlled diabetes type 2,DVT who was sent from urology office to ED after he was found to be anemic.  Patient had recent history of cystoscopy with fulguration by urology in July and has had a Foley catheter since then.  He was admitted on August 15 with complaints of hematuria.  At that time urology was consulted and was started on continuous bladder irrigation followed by clearing of urine and was sent home on August 18 with plan to follow-up with urology.  Anticoagulation was held.  An IVC filter was placed.  Presented back with recurrence of hematuria.    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 Urology was consulted.  Patient placed on CBI. Based on previous records it looks like he is also status post left prostatic artery embolization in July.  This was done by interventional radiology. Patient continued to have hematuria despite CBI.  Underwent cystoscopy and fulguration.  Bleeding is subsided.   Hemoglobin stable.  Cleared by urology for discharge.   Acute blood loss anemia He was transfused 2 units of PRBC 8/29.  Transfused additional 2 units on 8/31. With resolution of hematuria his hemoglobin is noted to be stable.   History of prostate cancer Followed by urology.  History of radiation treatment.    On finasteride.   Paroxysmal atrial fibrillation Rate controlled with metoprolol.  Anticoagulation was held during one of his previous hospitalization due to hematuria.   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 He has an IVC filter in situ. Not on anticoagulation as discussed above.   Essential hypertension    Prediabetes HbA1c 5.1 in June.  Diet controlled.   Patient is stable.  Wants to go home.  Cleared by urology.  Okay for discharge home today.   PERTINENT LABS:  The results of significant diagnostics from this hospitalization (including imaging, microbiology, ancillary and laboratory) are listed below for reference.     Labs:   Basic Metabolic Panel: Recent Labs  Lab 02/27/22 1420 02/28/22 0518 03/02/22 0530  NA 140 141 144  K 4.8 4.2 4.1  CL 109 111 111  CO2 '24 26 26  '$ GLUCOSE 121* 101* 102*  BUN '13 12 10  '$ CREATININE 0.96 0.92 1.03  CALCIUM 8.7* 8.5* 8.8*   Liver Function Tests: Recent Labs  Lab 02/27/22 1420  AST 33  ALT 13  ALKPHOS 51  BILITOT 0.5  PROT 6.3*  ALBUMIN 3.1*    CBC: Recent Labs  Lab 02/27/22 1420 02/28/22 0518 03/01/22 0511 03/01/22 1710 03/02/22 0530 03/03/22 0446  WBC 8.2 6.9 8.3  --  7.3 5.6  NEUTROABS 6.2  --   --   --   --   --   HGB 6.7* 7.5* 6.5* 7.3* 7.7* 7.5*  HCT 23.3* 24.5* 21.6* 23.7* 24.5* 24.3*  MCV 92.8 88.8 91.1  --  90.1 92.7  PLT 284 215 192  --  182 208      IMAGING STUDIES DG Chest 1 View  Result Date: 02/13/2022 CLINICAL DATA:  76 year old male with increased hematuria. EXAM: CHEST  1 VIEW COMPARISON:  Portable chest 01/24/2022 and  earlier. FINDINGS: Portable AP semi upright view at 0643 hours. Lung volumes and mediastinal contours are stable. Mild tortuosity of the thoracic aorta. Visualized tracheal air column is within normal limits. Allowing for portable technique the lungs are clear. No pneumothorax or pleural effusion. No acute osseous abnormality identified. IMPRESSION: Negative portable chest. Electronically Signed   By: Genevie Ann M.D.   On: 02/13/2022 07:35    DISCHARGE EXAMINATION: Vitals:   03/02/22 2127 03/03/22 0205 03/03/22 0552 03/03/22 0924  BP: 106/64 104/60 134/77 136/88  Pulse: 76 68 68 81  Resp: '17 16 19 18  '$ Temp: 98.7 F (37.1 C) 98.4 F (36.9 C) 98.3 F (36.8 C) 98 F (36.7 C)  TempSrc: Oral Oral Oral Oral  SpO2: 100% 100% 100% 100%  Weight:      Height:       General appearance: Awake alert.  In no distress Resp: Clear to auscultation bilaterally.  Normal effort Cardio: S1-S2 is normal regular.  No S3-S4.  No rubs murmurs or bruit GI: Abdomen is soft.  Nontender nondistended.  Bowel sounds are present normal.  No masses organomegaly    DISPOSITION: Home  Discharge Instructions     Call MD for:  difficulty breathing, headache or visual disturbances   Complete by: As directed    Call MD for:  extreme fatigue   Complete by: As directed    Call MD for:  persistant dizziness or light-headedness   Complete by: As directed    Call MD for:  persistant nausea and vomiting   Complete by: As directed    Call MD for:  severe uncontrolled pain   Complete by: As directed    Call MD for:  temperature >100.4   Complete by: As directed    Diet - low sodium heart healthy   Complete by: As directed    Discharge instructions   Complete by: As directed    Please follow-up with the urologist as instructed by them.  Seek attention if you see blood in the urine again.  Take care of the Foley catheter as instructed by urology.  You were cared for by a hospitalist during your hospital stay. If you have  any questions about your discharge medications or the care you received while you were in the hospital after you are discharged, you can call the unit and asked to speak with the hospitalist on call if the hospitalist that took care of you is not available. Once you are discharged, your primary care physician will handle any further medical issues. Please note that NO REFILLS for any discharge medications will be authorized once you are discharged, as it is imperative that you return to your primary care physician (or establish a relationship with a primary care physician if you do not have one) for your aftercare needs so that they can reassess your need for medications and monitor your lab values. If you do not have  a primary care physician, you can call [phone] for a physician referral.   Increase activity slowly   Complete by: As directed           Allergies as of 03/03/2022   No Known Allergies      Medication List     TAKE these medications    acetaminophen 500 MG tablet Commonly known as: TYLENOL Take 1,000 mg by mouth daily as needed (pain).   acetic acid 0.25 % irrigation Apply 1 Application topically every other day. Intravesical use   cyanocobalamin 1000 MCG tablet Commonly known as: VITAMIN B12 Take 1,000 mcg by mouth daily.   ergocalciferol 1.25 MG (50000 UT) capsule Commonly known as: VITAMIN D2 Take 50,000 Units by mouth once a week.   ferrous sulfate 325 (65 FE) MG tablet Take 325 mg by mouth daily with breakfast.   finasteride 5 MG tablet Commonly known as: PROSCAR Take 5 mg by mouth daily.   Fish Oil 1000 MG Caps Take 1,000 mg by mouth daily.   folic acid 1 MG tablet Commonly known as: FOLVITE Take 1 mg by mouth daily.   gabapentin 300 MG capsule Commonly known as: NEURONTIN Take 300 mg by mouth 3 (three) times daily as needed (nerve pain).   hyoscyamine 0.125 MG SL tablet Commonly known as: LEVSIN SL Place 1 tablet (0.125 mg total) under the  tongue every 6 (six) hours as needed (bladder spasms).   lisinopril 40 MG tablet Commonly known as: ZESTRIL Take 40 mg by mouth daily.   magnesium oxide 400 MG tablet Commonly known as: MAG-OX Take 400 mg by mouth daily.   metoprolol succinate 100 MG 24 hr tablet Commonly known as: TOPROL-XL Take 100 mg by mouth daily.   oxycodone 5 MG capsule Commonly known as: OXY-IR Take 1 capsule (5 mg total) by mouth every 6 (six) hours as needed for pain.   prenatal multivitamin Tabs tablet Take 1 tablet by mouth daily at 12 noon.   SALINE FLUSH IV Inject into the vein daily.          Follow-up Information     [facility] [place] Follow up.   Specialty: Home Health Services Why: [facility] is to provide home health physical and occupational therapy. Contact information: [address]  [place] [address] [phone]                 TOTAL DISCHARGE TIME: 35 minutes  [facility] Pager on [email or web address]  03/04/2022, 11:59 AM

## 2022-03-03 NOTE — Progress Notes (Signed)
PT Cancellation Note  Patient Details Name: Nathan [name] MRN: [id number] DOB: [birth year]   Cancelled Treatment:    Reason Eval/Treat Not Completed: PT screened, no needs identified, will sign off. Pt sitting EOB dressing himself getting ready for d/c. Spoke to pt who states he walks with a RW at home.  He has walked with mobility specialist while here 250' with RW and S.  Pt states he has "too much help" at home and not interested in [facility].  Will sign off at this time. Santiago Glad L. Tamala Julian, PT  03/03/2022    [name] 03/03/2022, 10:10 AM

## 2022-03-06 ENCOUNTER — Encounter (HOSPITAL_COMMUNITY): Payer: Self-pay | Admitting: Urology

## 2022-03-10 NOTE — Anesthesia Postprocedure Evaluation (Signed)
Anesthesia Post Note  Patient: Nathan [name]  Procedure(s) Performed: CYSTOSCOPY, CLOT EVACUTION WITH TRANSURETHERAL [place] OF BLADDER     Patient location during evaluation: PACU Anesthesia Type: General Level of consciousness: awake Pain management: pain level controlled Vital Signs Assessment: post-procedure vital signs reviewed and stable Respiratory status: spontaneous breathing, nonlabored ventilation, respiratory function stable and patient connected to nasal cannula oxygen Cardiovascular status: blood pressure returned to baseline and stable Postop Assessment: no apparent nausea or vomiting Anesthetic complications: no   No notable events documented.  Last Vitals:  Vitals:   03/03/22 0552 03/03/22 0924  BP: 134/77 136/88  Pulse: 68 81  Resp: 19 18  Temp: 36.8 C 36.7 C  SpO2: 100% 100%    Last Pain:  Vitals:   03/03/22 0924  TempSrc: Oral  PainSc:                  Karyl Kinnier Micholas Drumwright

## 2022-03-14 ENCOUNTER — Encounter (HOSPITAL_BASED_OUTPATIENT_CLINIC_OR_DEPARTMENT_OTHER): Payer: Medicare Other | Attending: General Surgery | Admitting: General Surgery

## 2022-03-14 DIAGNOSIS — C61 Malignant neoplasm of prostate: Secondary | ICD-10-CM | POA: Insufficient documentation

## 2022-03-14 DIAGNOSIS — Z923 Personal history of irradiation: Secondary | ICD-10-CM | POA: Diagnosis not present

## 2022-03-14 DIAGNOSIS — Z87891 Personal history of nicotine dependence: Secondary | ICD-10-CM | POA: Diagnosis not present

## 2022-03-14 DIAGNOSIS — N3041 Irradiation cystitis with hematuria: Secondary | ICD-10-CM | POA: Diagnosis present

## 2022-03-14 DIAGNOSIS — I480 Paroxysmal atrial fibrillation: Secondary | ICD-10-CM | POA: Diagnosis not present

## 2022-03-14 DIAGNOSIS — I10 Essential (primary) hypertension: Secondary | ICD-10-CM | POA: Diagnosis not present

## 2022-03-14 NOTE — Progress Notes (Signed)
KRISTA, SOM ([id number]) Visit Report for 03/14/2022 Abuse Risk Screen Details Patient Name: Date of Service: Nathan [name], [name] 03/14/2022 8:00 A M Medical Record Number: [id number] Patient Account Number: [id number] Date of Birth/Sex: Treating RN: [birth year] (76 y.o. Male) Dellie Catholic Primary Care Brigido Mera: [name] Other Clinician: Referring Zalayah Pizzuto: Treating Nadav Swindell/Extender: Emelda Fear in Treatment: 0 Abuse Risk Screen Items Answer ABUSE RISK SCREEN: Has anyone close to you tried to hurt or harm you recentlyo No Do you feel uncomfortable with anyone in your familyo No Has anyone forced you do things that you didnt want to doo No Electronic Signature(s) Signed: 03/14/2022 10:08:18 AM By: Dellie Catholic RN Entered By: Dellie Catholic on 03/14/2022 08:15:15 -------------------------------------------------------------------------------- Activities of Daily Living Details Patient Name: Date of Service: Nathan [name] [name] 03/14/2022 8:00 A M Medical Record Number: [id number] Patient Account Number: [id number] Date of Birth/Sex: Treating RN: [birth year] (76 y.o. Male) Dellie Catholic Primary Care Zebastian Carico: [name] Other Clinician: Referring Gabrille Kilbride: Treating Cipriano Millikan/Extender: Emelda Fear in Treatment: 0 Activities of Daily Living Items Answer Activities of Daily Living (Please select one for each item) Drive Automobile Not Able T Medications ake Completely Able Use T elephone Completely Able Care for Appearance Completely Able Use T oilet Completely Able Bath / Shower Completely Able Dress Self Completely Able Feed Self Completely Able Walk Completely Able Get In / Out Bed Completely Able Housework Need Assistance Prepare Meals Completely [place] for Self Completely Able Electronic Signature(s) Signed: 03/14/2022 10:08:18 AM By: Dellie Catholic RN Entered By:  Dellie Catholic on 03/14/2022 08:16:03 -------------------------------------------------------------------------------- Education Screening Details Patient Name: Date of Service: Nathan [name], [name] 03/14/2022 8:00 A M Medical Record Number: [id number] Patient Account Number: [id number] Date of Birth/Sex: Treating RN: [birth year] (76 y.o. Male) Dellie Catholic Primary Care [name]: [name] Other Clinician: Referring Makayah Pauli: Treating Kenzly Rogoff/Extender: Emelda Fear in Treatment: 0 Primary Learner Assessed: Patient Learning Preferences/Education Level/Primary Language Learning Preference: Explanation, Demonstration, Printed Material Highest Education Level: High School Preferred Language: English Cognitive Barrier Language Barrier: No Translator Needed: No Memory Deficit: No Emotional Barrier: No Cultural/Religious Beliefs Affecting Medical Care: No Physical Barrier Impaired Vision: No Impaired Hearing: No Decreased Hand dexterity: No Knowledge/Comprehension Knowledge Level: High Comprehension Level: High Ability to understand written instructions: High Ability to understand verbal instructions: High Motivation Anxiety Level: Calm Cooperation: Cooperative Education Importance: Acknowledges Need Interest in Health Problems: Asks Questions Perception: Coherent Willingness to Engage in Self-Management High Activities: Readiness to Engage in Self-Management High Activities: Electronic Signature(s) Signed: 03/14/2022 10:08:18 AM By: Dellie Catholic RN Entered By: Dellie Catholic on 03/14/2022 08:16:57 -------------------------------------------------------------------------------- Fall Risk Assessment Details Patient Name: Date of Service: Nathan [name], [name] 03/14/2022 8:00 A M Medical Record Number: [id number] Patient Account Number: [id number] Date of Birth/Sex: Treating RN: 04/25/1946 (76 y.o. Male) Dellie Catholic Primary Care Vrishank Moster:  [name] Other Clinician: Referring Nasri Boakye: Treating Evalyn Shultis/Extender: Emelda Fear in Treatment: 0 Fall Risk Assessment Items Have you had 2 or more falls in the last 12 monthso 0 No Have you had any fall that resulted in injury in the last 12 monthso 0 No FALLS RISK SCREEN History of falling - immediate or within 3 months 0 No Secondary diagnosis (Do you have 2 or more medical diagnoseso) 0 No Ambulatory aid None/bed rest/wheelchair/nurse 0 No Crutches/cane/walker 15 Yes Furniture 0 No Intravenous therapy Access/Saline/Heparin Lock 0 No Gait/Transferring Normal/ bed rest/ wheelchair 0 No  Weak (short steps with or without shuffle, stooped but able to lift head while walking, may seek 0 No support from furniture) Impaired (short steps with shuffle, may have difficulty arising from chair, head down, impaired 0 No balance) Mental Status Oriented to own ability 0 No Electronic Signature(s) Signed: 03/14/2022 10:08:18 AM By: Dellie Catholic RN Entered By: Dellie Catholic on 03/14/2022 08:17:20 -------------------------------------------------------------------------------- Foot Assessment Details Patient Name: Date of Service: Nathan [name], [name] 03/14/2022 8:00 A M Medical Record Number: [id number] Patient Account Number: [id number] Date of Birth/Sex: Treating RN: [birth year] (76 y.o. Male) Dellie Catholic Primary Care [name]: [name] Other Clinician: Referring Jermesha Sottile: Treating Jerrad Mendibles/Extender: Emelda Fear in Treatment: 0 Foot Assessment Items Site Locations + = Sensation present, - = Sensation absent, C = Callus, U = Ulcer R = Redness, W = Warmth, M = Maceration, PU = Pre-ulcerative lesion F = Fissure, S = Swelling, D = Dryness Assessment Right: Left: Other Deformity: No No Prior Foot Ulcer: No No Prior Amputation: No No Charcot Joint: No No Ambulatory Status: Ambulatory With Help Assistance  Device: Cane Gait: Steady Electronic Signature(s) Signed: 03/14/2022 10:08:18 AM By: Dellie Catholic RN Entered By: Dellie Catholic on 03/14/2022 08:17:49 -------------------------------------------------------------------------------- Nutrition Risk Screening Details Patient Name: Date of Service: Nathan [name], [name] 03/14/2022 8:00 A M Medical Record Number: [id number] Patient Account Number: [id number] Date of Birth/Sex: Treating RN: 31-Jul-1945 (76 y.o. Male) Dellie Catholic Primary Care Zorian Gunderman: [name] Other Clinician: Referring Kambry Takacs: Treating Latarshia Jersey/Extender: Emelda Fear in Treatment: 0 Height (in): 73 Weight (lbs): 183 Body Mass Index (BMI): 24.1 Nutrition Risk Screening Items Score Screening NUTRITION RISK SCREEN: I have an illness or condition that made me change the kind and/or amount of food I eat 0 No I eat fewer than two meals per day 0 No I eat few fruits and vegetables, or milk products 0 No I have three or more drinks of beer, liquor or wine almost every day 0 No I have tooth or mouth problems that make it hard for me to eat 0 No I don't always have enough money to buy the food I need 0 No I eat alone most of the time 0 No I take three or more different prescribed or over-the-counter drugs a day 0 No Without wanting to, I have lost or gained 10 pounds in the last six months 0 No I am not always physically able to shop, cook and/or feed myself 0 No Nutrition Protocols Good Risk Protocol 0 No interventions needed Moderate Risk Protocol High Risk Proctocol Risk Level: Good Risk Score: 0 Electronic Signature(s) Signed: 03/14/2022 10:08:18 AM By: Dellie Catholic RN Entered By: Dellie Catholic on 03/14/2022 08:17:38

## 2022-03-14 NOTE — Progress Notes (Signed)
OLVIN, ROHR ([id number]) Visit Report for 03/14/2022 Allergy List Details Patient Name: Date of Service: Nathan [name], Nathan [name] 03/14/2022 8:00 A M Medical Record Number: [id number] Patient Account Number: [id number] Date of Birth/Sex: Treating RN: August 09, 1945 (76 y.o. Male) Dellie Catholic Primary Care Haley Roza: [name] Other Clinician: Referring Valree Feild: Treating Cartez Mogle/Extender: Kathe Mariner Weeks in Treatment: 0 Allergies Active Allergies No Known Allergies Allergy Notes Electronic Signature(s) Signed: 03/14/2022 10:08:18 AM By: Dellie Catholic RN Entered By: Dellie Catholic on 03/14/2022 08:04:49 -------------------------------------------------------------------------------- Arrival Information Details Patient Name: Date of Service: Nathan [name], Nathan [name] 03/14/2022 8:00 A M Medical Record Number: [id number] Patient Account Number: [id number] Date of Birth/Sex: Treating RN: [birth year] (76 y.o. Male) Dellie Catholic Primary Care Charlesia Canaday: [name] Other Clinician: Referring Ninoska Goswick: Treating Lorijean Husser/Extender: Emelda Fear in Treatment: 0 Visit Information Patient Arrived: Lyndel Pleasure Time: 08:01 Accompanied By: Daughter Transfer Assistance: None Patient Identification Verified: Yes Electronic Signature(s) Signed: 03/14/2022 10:08:18 AM By: Dellie Catholic RN Entered By: Dellie Catholic on 03/14/2022 08:02:27 -------------------------------------------------------------------------------- Clinic Level of Care Assessment Details Patient Name: Date of Service: [name], Nathan [name] 03/14/2022 8:00 A M Medical Record Number: [id number] Patient Account Number: [id number] Date of Birth/Sex: Treating RN: January 20, 1946 (76 y.o. Male) Dellie Catholic Primary Care Jehu Mccauslin: [name] Other Clinician: Referring Alyra Patty: Treating Leva Baine/Extender: Emelda Fear in Treatment: 0 Clinic Level of Care  Assessment Items TOOL 4 Quantity Score X- 1 0 Use when only an EandM is performed on FOLLOW-UP visit ASSESSMENTS - Nursing Assessment / Reassessment X- 1 10 Reassessment of Co-morbidities (includes updates in patient status) X- 1 5 Reassessment of Adherence to Treatment Plan ASSESSMENTS - Wound and Skin A ssessment / Reassessment '[]'$  - 0 Simple Wound Assessment / Reassessment - one wound '[]'$  - 0 Complex Wound Assessment / Reassessment - multiple wounds '[]'$  - 0 Dermatologic / Skin Assessment (not related to wound area) ASSESSMENTS - Focused Assessment '[]'$  - 0 Circumferential Edema Measurements - multi extremities '[]'$  - 0 Nutritional Assessment / Counseling / Intervention '[]'$  - 0 Lower Extremity Assessment (monofilament, tuning fork, pulses) '[]'$  - 0 Peripheral Arterial Disease Assessment (using hand held doppler) ASSESSMENTS - Ostomy and/or Continence Assessment and Care '[]'$  - 0 Incontinence Assessment and Management '[]'$  - 0 Ostomy Care Assessment and Management (repouching, etc.) PROCESS - Coordination of Care '[]'$  - 0 Simple Patient / Family Education for ongoing care X- 1 20 Complex (extensive) Patient / Family Education for ongoing care X- 1 10 Staff obtains Programmer, systems, Records, T Results / Process Orders est '[]'$  - 0 Staff telephones HHA, Nursing Homes / Clarify orders / etc '[]'$  - 0 Routine Transfer to another Facility (non-emergent condition) '[]'$  - 0 Routine Hospital Admission (non-emergent condition) X- 1 15 New Admissions / Biomedical engineer / Ordering NPWT Apligraf, etc. , '[]'$  - 0 Emergency Hospital Admission (emergent condition) '[]'$  - 0 Simple Discharge Coordination '[]'$  - 0 Complex (extensive) Discharge Coordination PROCESS - Special Needs '[]'$  - 0 Pediatric / Minor Patient Management '[]'$  - 0 Isolation Patient Management '[]'$  - 0 Hearing / Language / Visual special needs '[]'$  - 0 Assessment of Community assistance (transportation, D/C planning, etc.) '[]'$  -  0 Additional assistance / Altered mentation '[]'$  - 0 Support Surface(s) Assessment (bed, cushion, seat, etc.) INTERVENTIONS - Wound Cleansing / Measurement '[]'$  - 0 Simple Wound Cleansing - one wound '[]'$  - 0 Complex Wound Cleansing - multiple wounds '[]'$  - 0 Wound Imaging (photographs - any number of wounds) '[]'$  -  0 Wound Tracing (instead of photographs) '[]'$  - 0 Simple Wound Measurement - one wound '[]'$  - 0 Complex Wound Measurement - multiple wounds INTERVENTIONS - Wound Dressings '[]'$  - 0 Small Wound Dressing one or multiple wounds '[]'$  - 0 Medium Wound Dressing one or multiple wounds '[]'$  - 0 Large Wound Dressing one or multiple wounds '[]'$  - 0 Application of Medications - topical '[]'$  - 0 Application of Medications - injection INTERVENTIONS - Miscellaneous X- 1 5 External ear exam '[]'$  - 0 Specimen Collection (cultures, biopsies, blood, body fluids, etc.) '[]'$  - 0 Specimen(s) / Culture(s) sent or taken to Lab for analysis '[]'$  - 0 Patient Transfer (multiple staff / Civil Service fast streamer / Similar devices) '[]'$  - 0 Simple Staple / Suture removal (25 or less) '[]'$  - 0 Complex Staple / Suture removal (26 or more) '[]'$  - 0 Hypo / Hyperglycemic Management (close monitor of Blood Glucose) '[]'$  - 0 Ankle / Brachial Index (ABI) - do not check if billed separately X- 1 5 Vital Signs Has the patient been seen at the hospital within the last three years: Yes Total Score: 70 Level Of Care: New/Established - Level 2 Notes Scribed for Dr. Celine Ahr by J.Scotton RN (Dr. Celine Ahr performed bilateral ear examinations). Electronic Signature(s) Signed: 03/14/2022 10:08:18 AM By: Dellie Catholic RN Entered By: Dellie Catholic on 03/14/2022 08:55:16 -------------------------------------------------------------------------------- Encounter Discharge Information Details Patient Name: Date of Service: Nathan [name], Nathan [name] 03/14/2022 8:00 A M Medical Record Number: [id number] Patient Account Number: [id number] Date of Birth/Sex:  Treating RN: [birth year] (76 y.o. Male) Dellie Catholic Primary Care Smt. Loder: [name] Other Clinician: Referring Petra Dumler: Treating Johonna Binette/Extender: Emelda Fear in Treatment: 0 Encounter Discharge Information Items Discharge Condition: Stable Ambulatory Status: Cane Discharge Destination: Home Transportation: Private Auto Accompanied By: daughter Schedule Follow-up Appointment: Yes Clinical Summary of Care: Patient Declined Electronic Signature(s) Signed: 03/14/2022 10:08:18 AM By: Dellie Catholic RN Entered By: Dellie Catholic on 03/14/2022 08:57:07 -------------------------------------------------------------------------------- Lower Extremity Assessment Details Patient Name: Date of Service: [name], Nathan [name] 03/14/2022 8:00 A M Medical Record Number: [id number] Patient Account Number: [id number] Date of Birth/Sex: Treating RN: [birth year] (76 y.o. Male) Dellie Catholic Primary Care Milt Coye: [name] Other Clinician: Referring Lael Wetherbee: Treating Liset Mcmonigle/Extender: Emelda Fear in Treatment: 0 Electronic Signature(s) Signed: 03/14/2022 10:08:18 AM By: Dellie Catholic RN Entered By: Dellie Catholic on 03/14/2022 08:17:56 -------------------------------------------------------------------------------- Multi Wound Chart Details Patient Name: Date of Service: Nathan [name], Nathan [name] 03/14/2022 8:00 A M Medical Record Number: [id number] Patient Account Number: [id number] Date of Birth/Sex: Treating RN: Apr 24, 1946 (76 y.o. Male) Dellie Catholic Primary Care Lemuel Boodram: [name] Other Clinician: Referring Siennah Barrasso: Treating Damoni Erker/Extender: Emelda Fear in Treatment: 0 Vital Signs Height(in): 73 Pulse(bpm): 89 Weight(lbs): 183 Blood Pressure(mmHg): 149/82 Body Mass Index(BMI): 24.1 Temperature(F): 98.6 Respiratory Rate(breaths/min): 16 Wound Assessments Treatment  Notes Electronic Signature(s) Signed: 03/14/2022 9:29:18 AM By: [name] MD FACS Signed: 03/14/2022 10:08:18 AM By: Dellie Catholic RN Entered By: [name] on 03/14/2022 09:29:18 -------------------------------------------------------------------------------- Multi-Disciplinary Care Plan Details Patient Name: Date of Service: Nathan [name], Nathan [name] 03/14/2022 8:00 A M Medical Record Number: [id number] Patient Account Number: [id number] Date of Birth/Sex: Treating RN: 1946-01-05 (76 y.o. 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 Goals: Patient will tolerate the hyperbaric oxygen therapy treatment Date Initiated:  03/14/2022 Target Resolution Date: 06/01/2022 Goal Status: Active Interventions: Administer decongestants, per physician orders, prior to HBO2 Notes: Electronic Signature(s) Signed: 03/14/2022 10:08:18 AM By: Dellie Catholic RN Entered By: Dellie Catholic on 03/14/2022 08:51:37 -------------------------------------------------------------------------------- Pain Assessment Details Patient Name: Date of Service: Nathan [name], Nathan [name] 03/14/2022 8:00 A M Medical Record Number: [id number] Patient Account Number: [id number] Date of Birth/Sex: Treating RN: 08-14-1945 (76 y.o. Male) Dellie Catholic Primary Care Linn Clavin: [name] Other Clinician: Referring Larance Ratledge: Treating Rease Wence/Extender: Emelda Fear in Treatment: 0 Active Problems Location of Pain Severity and Description of Pain Patient Has Paino No Site Locations Pain Management and Medication Current Pain Management: Electronic Signature(s) Signed: 03/14/2022 10:08:18 AM By: Dellie Catholic RN Entered By:  Dellie Catholic on 03/14/2022 08:18:18 -------------------------------------------------------------------------------- Patient/Caregiver Education Details Patient Name: Date of Service: Nathan [name], Nathan [name] 9/13/2023andnbsp8:00 A M Medical Record Number: [id number] Patient Account Number: [id number] Date of Birth/Gender: Treating RN: [birth year] (76 y.o. Male) Dellie Catholic Primary Care Physician: [name] Other Clinician: Referring Physician: Treating Physician/Extender: Emelda Fear in Treatment: 0 Education Assessment Education Provided To: Patient Education Topics Provided Hyperbaric Oxygenation: Methods: Explain/Verbal Responses: Return demonstration correctly Electronic Signature(s) Signed: 03/14/2022 10:08:18 AM By: Dellie Catholic RN Entered By: Dellie Catholic on 03/14/2022 08:51:50 -------------------------------------------------------------------------------- Vitals Details Patient Name: Date of Service: Nathan [name], Nathan [name] 03/14/2022 8:00 A M Medical Record Number: [id number] Patient Account Number: [id number] Date of Birth/Sex: Treating RN: 1946/06/24 (76 y.o. Male) Dellie Catholic Primary Care Zell Doucette: [name] Other Clinician: Referring Keitha Kolk: Treating Jolea Dolle/Extender: Emelda Fear in Treatment: 0 Vital Signs Time Taken: 08:02 Temperature (F): 98.6 Height (in): 73 Pulse (bpm): 89 Source: Stated Respiratory Rate (breaths/min): 16 Weight (lbs): 183 Blood Pressure (mmHg): 149/82 Source: Stated Reference Range: 80 - 120 mg / dl Body Mass Index (BMI): 24.1 Electronic Signature(s) Signed: 03/14/2022 10:08:18 AM By: Dellie Catholic RN Entered By: Dellie Catholic on 03/14/2022 08:03:34

## 2022-03-14 NOTE — Progress Notes (Addendum)
Nathan [name] ([id number]) Visit Report for 03/14/2022 Chief Complaint Document Details Patient Name: Date of Service: Nathan [name] [name] 03/14/2022 8:00 A M Medical Record Number: [id number] Patient Account Number: [id number] Date of Birth/Sex: Treating RN: 10/08/1945 (76 y.o. 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 [name] Primary Care Provider: [name] Other Clinician: Referring Provider: Treating Provider/Extender: Emelda Fear in Treatment: 0 History of Present Illness HPI Description: ADMISSION 03/14/2022 This is a 76 year old man with a past medical history significant for prostate cancer. He was treated with radiation therapy and received a total of 70 Gy. His treatment took place between September 16, 2019 and October 23, 2019. He apparently had issues with hematuria even during his treatment phase. He has had a suprapubic catheter placed in the interim. Over the past year, he has had multiple hospitalizations for acute blood loss anemia secondary to hematuria. This is required multiple cystoscopies with clot evacuation and fulguration of bleeding portions of his bladder and prostate. 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 He has also undergone selective embolization of the prostatic artery in an effort to reduce his bleeding. His urologist has referred him to the wound care center for hyperbaric oxygen therapy. He does have paroxysmal atrial fibrillation but does not take anticoagulation because of his bleeding. He developed a left lower extremity DVT and has had a caval filter placed. He is not diabetic and does not smoke. His most recent EKG (02/13/2022) showed sinus rhythm with some premature atrial complexes. Multiple chest x-rays, including the most recent done on 02/13/2022, show no evidence of cardiopulmonary disease. Electronic Signature(s) Signed: 03/14/2022 10:42:45 AM By: [name] MD FACS Previous Signature: 03/14/2022 10:11:32 AM Version By: [name] MD FACS Entered By: [name] on 03/14/2022 10:42:45 -------------------------------------------------------------------------------- Physical Exam Details Patient Name: Date of Service: Nathan [name] 03/14/2022 8:00 A M Medical Record Number: [id number] Patient Account Number: [id number] Date of Birth/Sex: Treating RN: [birth year] (76 y.o. [name] Primary Care Provider: [name] Other Clinician: Referring Provider: Treating Provider/Extender: Kathe Mariner Weeks in Treatment: 0 Constitutional Slightly hypertensive. . . . No acute distress. Ears, Nose, Mouth, and Throat Unable to adequately visualize tympanic membranes bilaterally secondary to cerumen accumulation. Respiratory .[facility] . Electronic Signature(s) Signed: 03/14/2022 10:43:38 AM By: [name] MD FACS Entered By: [name] on 03/14/2022 10:43:38 -------------------------------------------------------------------------------- Physician Orders Details Patient Name: Date of Service: Nathan [name] 03/14/2022 8:00 A M Medical Record Number: [id number] Patient Account Number:  [id number] Date of Birth/Sex: Treating RN: 1946/04/28 (76 y.o. [name] Primary Care Provider: [name] Other Clinician: Referring Provider: Treating Provider/Extender: Emelda Fear in Treatment: 0 Verbal / Phone Orders: No Diagnosis Coding ICD-10 Coding Code Description 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 Personal history of irradiation C61 Malignant neoplasm of prostate I10 Essential (primary) hypertension I48.0 Paroxysmal atrial fibrillation Follow-up Appointments Other: - Dr Celine Ahr. ***  Please clean ears -Hydrogen Peroxide and water or Debrox ear cleanser.Thank you Hyperbaric Oxygen Therapy 2.0 ATA for 90 Minutes without A Breaks - 90 Minutes without airbreaks ir Total Number of Treatments: - 40 One treatments per day (delivered Monday through Friday unless otherwise specified in Special Instructions below): Afrin (Oxymetazoline HCL) 0.05% nasal spray - 1 spray in both nostrils daily as needed prior to HBO treatment for difficulty clearing ears Electronic Signature(s) Signed: 03/14/2022 10:44:00 AM By: [name] MD FACS Previous Signature: 03/14/2022 10:08:18 AM Version By: Dellie Catholic RN Entered By: [name] on 03/14/2022 10:44:00 -------------------------------------------------------------------------------- Problem List Details Patient Name: Date of Service: Nathan [name] 03/14/2022 8:00 A M Medical Record Number: [id number] Patient Account Number: [id number] Date of Birth/Sex: Treating RN: 12-18-1945 (76 y.o. [name] Primary Care Provider: [name] Other Clinician: Referring Provider: Treating Provider/Extender: Emelda Fear in Treatment: 0 Active Problems ICD-10 Encounter Code Description Active Date MDM Diagnosis N30.41 Irradiation cystitis with hematuria 03/14/2022 No Yes Z92.3 Personal history of irradiation 03/14/2022 No Yes C61 Malignant  neoplasm of prostate 03/14/2022 No Yes I10 Essential (primary) hypertension 03/14/2022 No Yes I48.0 Paroxysmal atrial fibrillation 03/14/2022 No Yes Inactive Problems Resolved Problems Electronic Signature(s) Signed: 03/14/2022 9:29:12 AM By: [name] MD FACS Entered By: [name] on 03/14/2022 09:29:12 -------------------------------------------------------------------------------- Progress Note Details Patient Name: Date of Service: Nathan [name] 03/14/2022 8:00 A M Medical Record Number: [id number] Patient Account Number: [id number] Date of Birth/Sex: Treating RN: June 19, 1946 (76 y.o. 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 He was treated with radiation therapy and received a total of 70 Gy. His treatment took place between September 16, 2019 and October 23, 2019. He apparently had issues with hematuria even during his treatment phase. He has had a suprapubic catheter placed in the interim. Over the past year, he has had multiple hospitalizations for acute blood loss anemia secondary to hematuria. This is required multiple cystoscopies with clot evacuation and fulguration of bleeding portions of his bladder and prostate. 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 He has also undergone selective embolization of the prostatic artery in an effort to reduce his bleeding. His urologist has referred him to the wound care center for hyperbaric oxygen therapy. He does have paroxysmal atrial fibrillation but does not  take anticoagulation because of his bleeding. He developed a left lower extremity DVT and has had a caval filter placed. He is not diabetic and does not smoke. His most recent EKG (02/13/2022) showed sinus rhythm with some premature atrial complexes. Multiple chest x-rays, including the most recent done on 02/13/2022, show no evidence of cardiopulmonary disease. Patient History Information obtained from Patient. Allergies No Known Allergies Family History Unknown History. Social History Former smoker - ended on 03/03/2019, Marital Status - Married, Alcohol Use - Never - 3 years ago, Drug Use - No History, Caffeine Use - Daily - Tea. Medical History Hematologic/Lymphatic Patient has history of Anemia Cardiovascular Patient has history of Angina - History, Arrhythmia - Hx A Fib, Hypertension Endocrine Patient has history of Type II Diabetes Hospitalization/Surgery History - Brachytherapy (Prostate cancer);Prostate  Artery Embolization (Left) Chest Wall Lipoma removal; Cystoscopy;Marland Kitchen Medical A Surgical History Notes nd Cardiovascular Wolff-Parkinson-White Syndrome Genitourinary Foley Catheter Oncologic Cancer of the prostate Review of Systems (ROS) Constitutional Symptoms (General Health) Denies complaints or symptoms of Fatigue, Fever, Chills, Marked Weight Change. Eyes Denies complaints or symptoms of Dry Eyes, Vision Changes, Glasses / Contacts. Ear/Nose/Mouth/Throat Denies complaints or symptoms of Chronic sinus problems or rhinitis. Neurologic Denies complaints or symptoms of Numbness/parasthesias. Psychiatric Denies complaints or symptoms of Claustrophobia, Suicidal. Objective Constitutional Slightly hypertensive. No acute distress. Vitals Time Taken: 8:02 AM, Height: 73 in, Source: Stated, Weight: 183 lbs, Source: Stated, BMI: 24.1, Temperature: 98.6 F, Pulse: 89 bpm, Respiratory Rate: 16 breaths/min, Blood Pressure: 149/82 mmHg. Ears, Nose, Mouth, and Throat Unable to  adequately visualize tympanic membranes bilaterally secondary to cerumen accumulation. 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 The most recent of these procedures was performed on March 02, 2022. In addition to the cystoscopy, clot evacuation and transurethral fulguration of the bladder was performed. Description of symptoms Due to his radiation cystitis, the patient has had episodes of hematuria. Very significant enough to require admission to the hospital with multiple blood transfusions. He has had multiple cystoscopies for clot evacuation. Required dilatation of his prostatic arteries to try and control the bleeding. X-Ray results Multiple chest x-rays have been performed. The most recent was on February 13, 2022. This did not demonstrate any evidence of cardiopulmonary disease. Plan of care/Summary This is a 76 year old man with a history of prostate cancer who was treated with radiation. He received 70 Gy in total. He has had refractory and even life- threatening hematuria as a result of his radiation cystitis. He will benefit from hyperbaric oxygen therapy. I have proposed an initial course of 40 treatments at 2.0 atm for 90 minutes without air breaks. The anticipated goal of treatment is to reduce or preferably, completely eliminate his episodes of hematuria that have resulted in the need for transfusion and hospitalization. In the interim, he will continue to follow-up with urology as needed to manage any ongoing symptoms while he is undergoing  hyperbaric oxygen treatment. Plan Follow-up Appointments: Other: - Dr Celine Ahr. ***Please clean ears -Hydrogen Peroxide and water or Debrox ear cleanser.Thank you Hyperbaric Oxygen Therapy: 2.0 ATA for 90 Minutes without Air Breaks - 90 Minutes without airbreaks T Number of Treatments: - 40 otal One treatments per day (delivered Monday through Friday unless otherwise specified in Special Instructions below): Afrin (Oxymetazoline HCL) 0.05% nasal spray - 1 spray in both nostrils daily as needed prior to HBO treatment for difficulty clearing ears This is a 76 year old man who had prostate cancer treated with IMRT 70 Gy total. He has experienced significant hematuria secondary to radiation cystitis. , Multiple interventions have been attempted including embolization of his prostatic arteries, multiple cystoscopies with clot evacuation and fulguration of the bladder and prostate. Despite this he continues to have hematuria to the point where he has required multiple blood transfusions. Chest x-ray and EKG are clear. He would benefit from hyperbaric oxygen therapy. I proposed an initial course of 40 treatments at 2 atm for 90 minutes without air breaks. Electronic  Signature(s) Signed: 03/15/2022 3:24:27 PM By: [name] MD FACS Previous Signature: 03/14/2022 10:55:15 AM Version By: [name] MD FACS Previous Signature: 03/14/2022 10:45:43 AM Version By: [name] MD FACS Entered By: [name] on 03/15/2022 15:24:27 -------------------------------------------------------------------------------- HxROS Details Patient Name: Date of Service: Nathan [name] 03/14/2022 8:00 A M Medical Record Number: [id number] Patient Account Number: [id number] Date of Birth/Sex: Treating RN: 04/16/1946 (76 y.o. [name] Primary Care Provider: [name] Other Clinician: Referring Provider: Treating Provider/Extender: Emelda Fear in Treatment:  0 Information Obtained From Patient Constitutional Symptoms (General Health) Complaints and Symptoms: Negative for: Fatigue; Fever; Chills; Marked Weight Change Eyes Complaints and Symptoms: Negative for: Dry Eyes; Vision Changes; Glasses / Contacts Ear/Nose/Mouth/Throat Complaints and Symptoms: Negative for: Chronic sinus problems or rhinitis Neurologic Complaints and Symptoms: Negative for: Numbness/parasthesias Psychiatric Complaints and Symptoms: Negative for: Claustrophobia; Suicidal Hematologic/Lymphatic Medical History: Positive for: Anemia Cardiovascular Medical History: Positive for: Angina - History; Arrhythmia - Hx A Fib; Hypertension Past Medical History Notes: Wolff-Parkinson-White Syndrome Endocrine Medical History: Positive for: Type II Diabetes Genitourinary Medical History: Past Medical History Notes: Foley Catheter Oncologic Medical History: Past Medical History Notes: Cancer of the prostate Immunizations Pneumococcal Vaccine: Received Pneumococcal Vaccination: No Implantable Devices None Hospitalization / Surgery History Type of Hospitalization/Surgery Brachytherapy (Prostate cancer);Prostate Artery Embolization (Left) Chest Wall Lipoma removal; Cystoscopy; Family and Social History Unknown History: Yes; Former smoker - ended on 03/03/2019; Marital Status - Married; Alcohol Use: Never - 3 years ago; Drug Use: No History; Caffeine Use: Daily - T Financial Concerns: No; Food, Clothing or Shelter Needs: No; Support System Lacking: No; Transportation Concerns: No ea; Electronic Signature(s) Signed: 03/14/2022 10:08:18 AM By: Dellie Catholic RN Signed: 03/14/2022 10:56:03 AM By: [name] MD FACS Entered By: Dellie Catholic on 03/14/2022 08:14:59 -------------------------------------------------------------------------------- SuperBill Details Patient Name: Date of Service: [name], [name] 03/14/2022 Medical Record Number: [id number] Patient  Account Number: [id number] Date of Birth/Sex: Treating RN: 1946-04-01 (76 y.o. [name] Primary Care Provider: [name] Other Clinician: Referring Provider: Treating Provider/Extender: Emelda Fear in Treatment: 0 Diagnosis Coding ICD-10 Codes Code Description 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 Personal history of irradiation C61 Malignant neoplasm of prostate I10 Essential (primary) hypertension I48.0 Paroxysmal atrial fibrillation Facility Procedures CPT4 Code: [id number] Description: [phone] - WOUND CARE VISIT-LEV 2 EST PT Modifier: Quantity: 1 Physician Procedures : CPT4 Code Description Modifier [id number] - WC PHYS LEVEL 4 - NEW PT ICD-10 Diagnosis Description 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 Personal history of irradiation C61 Malignant neoplasm of prostate I10 Essential (primary)  hypertension Quantity: 1 Electronic Signature(s) Signed: 03/14/2022 10:46:03 AM By: [name] MD FACS Previous Signature: 03/14/2022 10:08:18 AM Version By: Dellie Catholic RN Entered By: [name] on 03/14/2022 10:46:03

## 2022-03-19 ENCOUNTER — Encounter (HOSPITAL_BASED_OUTPATIENT_CLINIC_OR_DEPARTMENT_OTHER): Payer: Medicare Other | Admitting: Internal Medicine

## 2022-03-19 DIAGNOSIS — I10 Essential (primary) hypertension: Secondary | ICD-10-CM

## 2022-03-19 DIAGNOSIS — C61 Malignant neoplasm of prostate: Secondary | ICD-10-CM

## 2022-03-19 DIAGNOSIS — Z923 Personal history of irradiation: Secondary | ICD-10-CM

## 2022-03-19 DIAGNOSIS — N3041 Irradiation cystitis with hematuria: Secondary | ICD-10-CM

## 2022-03-19 NOTE — Progress Notes (Addendum)
KEIR, VIERNES ([id number]) Visit Report for 03/19/2022 Arrival Information Details Patient Name: Date of Service: Nathan [name] [name] 03/19/2022 1:00 PM Medical Record Number: [id number] Patient Account Number: [id number] Date of Birth/Sex: Treating RN: 1946/01/14 (76 y.o. [name], [name] Primary Care Slayton Lubitz: [name] Other Clinician: [name] Referring Catalyna Reilly: Treating Shaheim Mahar/Extender: [name] in Treatment: 0 Visit Information History Since Last Visit All ordered tests and consults were completed: Yes Patient Arrived: [name] Added or deleted any medications: No Arrival Time: 12:03 Any new allergies or adverse reactions: No Accompanied By: None Had a fall or experienced change in No Transfer Assistance: None activities of daily living that may affect Patient Identification Verified: Yes risk of falls: Secondary Verification Process Completed: Yes Signs or symptoms of abuse/neglect since last visito No Patient Requires Transmission-Based Precautions: No Hospitalized since last visit: No Patient Has Alerts: No Implantable device outside of the clinic excluding No cellular tissue based products placed in the center since last visit: Pain Present Now: No Electronic Signature(s) Signed: 03/19/2022 3:28:42 PM By: [name] EMT Entered By: [name] on 03/19/2022 15:28:42 -------------------------------------------------------------------------------- Encounter Discharge Information Details Patient Name: Date of Service: Nathan [name], Nathan [name] 03/19/2022 1:00 PM Medical Record Number: [id number] Patient Account Number: [id number] Date of Birth/Sex: Treating RN: 05/23/1946 (76 y.o. [name], [name] Primary Care Lyrick Worland: [name] Other Clinician: [name] Referring Kristie Bracewell: Treating Quentin Shorey/Extender: [name] in Treatment: 0 Encounter Discharge Information Items Discharge  Condition: Stable Ambulatory Status: Cane Discharge Destination: Home Transportation: Private Auto Accompanied By: None Schedule Follow-up Appointment: Yes Clinical Summary of Care: Electronic Signature(s) Signed: 03/19/2022 3:38:33 PM By: [name] EMT Entered By: [name] on 03/19/2022 15:38:32 -------------------------------------------------------------------------------- [place] Details Patient Name: Date of Service: Nathan [name], Nathan [name] 03/19/2022 1:00 PM Medical Record Number: [id number] Patient Account Number: [id number] Date of Birth/Sex: Treating RN: 1945-12-01 (76 y.o. [name], [name] Primary Care Jasmane Brockway: [name] Other Clinician: [name] Referring Safiya Girdler: Treating Adriel Kessen/Extender: [name] in Treatment: 0 Vital Signs Time Taken: 12:30 Temperature (F): 97.5 Height (in): 73 Pulse (bpm): 85 Weight (lbs): 183 Respiratory Rate (breaths/min): 16 Body Mass Index (BMI): 24.1 Blood Pressure (mmHg): 135/77 Reference Range: 80 - 120 mg / dl Electronic Signature(s) Signed: 03/19/2022 3:29:08 PM By: [name] EMT Entered By: [name] on 03/19/2022 15:29:08

## 2022-03-19 NOTE — Progress Notes (Addendum)
Nathan, [name] ([id number]) Visit Report for 03/19/2022 HBO Details Patient Name: Date of Service: Nathan [name] 03/19/2022 1:00 PM Medical Record Number: [id number] Patient Account Number: [id number] Date of Birth/Sex: Treating RN: 05/15/1946 (76 y.o. 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 HBO Treatment Details Treatment Number: 1 Patient Type: Outpatient Chamber Type: Monoplace Chamber Serial #: [id number] Treatment Protocol: 2.0 ATA with 90 minutes oxygen, and no air breaks Treatment Details Compression Rate Down: 1.0 psi / minute De-Compression Rate Up: 1.0 psi / minute Air breaks and breathing Decompress Decompress Compress Tx Pressure Begins Reached periods Begins Ends (leave unused spaces blank) Chamber Pressure (ATA 1 2 ------2 1 ) Clock Time (24 hr) 12:47 13:03 - - - - - - 14:34 14:47 Treatment Length: 120 (minutes) Treatment Segments: 4 Vital Signs Capillary Blood Glucose Reference Range: 80 - 120 mg / dl HBO Diabetic Blood Glucose Intervention Range: <131 mg/dl or >249 mg/dl Time Vitals Blood Respiratory Capillary Blood Glucose Pulse Action Type: Pulse: Temperature: Taken: Pressure: Rate: Glucose (mg/dl): Meter #: Oximetry (%) Taken: Pre 12:30 135/77 85 16 97.5 Post 14:50 135/77 62 16 97.9 Treatment Response Treatment Toleration: Well Treatment Completion Status: Treatment Completed without Adverse Event Treatment Notes The patient was seen by Dr. Heber Darlington before treatment was started today. This was the patient first treatment. Compression, and decompression was set at 1.0 PSI/min. I  asked the patient every 2 PSI, if he was able to clear his ear. He stated that he was. No problems noted during his treatment today. Dr. Kearney Hard patient prior to first treatment. Lungs were clear to auscultation on the anterior and posterior aspects. Cardiac exam with regular rate and rhythm with no murmurs gallops or rubs. Left ear difficult to visualize tympanic membrane due to significant cerumen. On the right side tympanic membrane was clear. Physician HBO Attestation: I certify that I supervised this HBO treatment in accordance with Medicare guidelines. A trained emergency response team is readily available per Yes hospital policies and procedures. Continue HBOT as ordered. Yes Electronic Signature(s) Signed: 03/19/2022 3:53:44 PM By: Kalman Shan DO Previous Signature: 03/19/2022 3:37:11 PM Version By: [name] EMT Entered By: Kalman Shan on 03/19/2022 15:47:34 -------------------------------------------------------------------------------- HBO Safety Checklist Details Patient Name: Date of Service: Nathan [name], Nathan [name] 03/19/2022 1:00 PM Medical Record Number: [id number] Patient Account Number: [id number] Date of Birth/Sex: Treating RN: 1946/06/23 (76 y.o. [name], [name] Primary Care Fredrico Beedle: [name] Other Clinician: [name] Referring Kinte Trim: Treating Jenah Vanasten/Extender: [name] in Treatment: 0 HBO Safety Checklist Items Safety Checklist Consent Form Signed Patient voided / foley secured and emptied When did you last eato 1000 Last dose of injectable or oral agent NA Ostomy pouch emptied and vented if applicable NA All implantable devices assessed, documented and approved Foley to leg bag Intravenous access site secured and place NA Valuables secured Linens and cotton and cotton/polyester blend (less than 51% polyester) Personal oil-based products / skin lotions / body lotions removed Wigs or  hairpieces removed NA Smoking or tobacco materials removed Books / newspapers / magazines / loose paper removed Cologne, aftershave, perfume and deodorant removed Jewelry removed (may wrap wedding band) NA Make-up removed NA Hair care products removed Battery  operated devices (external) removed Heating patches and chemical warmers removed Titanium eyewear removed NA Nail polish cured greater than 10 hours NA Casting material cured greater than 10 hours NA Hearing aids removed NA Loose dentures or partials removed NA Prosthetics have been removed NA Patient demonstrates correct use of air break device (if applicable) Patient concerns have been addressed Patient grounding bracelet on and cord attached to chamber Specifics for Inpatients (complete in addition to above) Medication sheet sent with patient NA Intravenous medications needed or due during therapy sent with patient NA Drainage tubes (e.g. nasogastric tube or chest tube secured and vented) NA Endotracheal or Tracheotomy tube secured NA Cuff deflated of air and inflated with saline NA Airway suctioned NA Notes The safety checklist was done before the treatment was started. Electronic Signature(s) Signed: 03/19/2022 3:30:21 PM By: [name] EMT Entered By: [name] on 03/19/2022 15:30:20

## 2022-03-19 NOTE — Progress Notes (Signed)
Nathan [name], Nathan [name] ([id number]) Visit Report for 03/19/2022 Problem List Details Patient Name: Date of Service: [name] 03/19/2022 1:00 PM Medical Record Number: [id number] Patient Account Number: [id number] Date of Birth/Sex: Treating RN: Aug 25, 1945 (76 y.o. [name], [name] Primary Care Provider: [name] Other Clinician: [name] Referring Provider: Treating Provider/Extender: [name] in Treatment: 0 Active Problems ICD-10 Encounter Code Description Active Date MDM Diagnosis N30.41 Irradiation cystitis with hematuria 03/14/2022 No Yes Z92.3 Personal history of irradiation 03/14/2022 No Yes C61 Malignant neoplasm of prostate 03/14/2022 No Yes I10 Essential (primary) hypertension 03/14/2022 No Yes I48.0 Paroxysmal atrial fibrillation 03/14/2022 No Yes Inactive Problems Resolved Problems Electronic Signature(s) Signed: 03/19/2022 3:38:08 PM By: [name] EMT Signed: 03/19/2022 3:53:44 PM By: Kalman Shan DO Entered By: [name] on 03/19/2022 15:38:08 -------------------------------------------------------------------------------- SuperBill Details Patient Name: Date of Service: [name], [name] 03/19/2022 Medical Record Number: [id number] Patient Account Number: [id number] Date of Birth/Sex: Treating RN: 10-23-1945 (76 y.o. 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 I10 Essential (primary) hypertension I48.0 Paroxysmal atrial fibrillation Facility Procedures CPT4 Code: [id number] Description: G0277-(Facility Use Only) HBOT full body chamber, 49mn , ICD-10 Diagnosis Description N30.41 Irradiation  cystitis with hematuria Z92.3 Personal history of irradiation C61 Malignant neoplasm of prostate I10 Essential (primary) hypertension Modifier: Quantity: 4 Physician Procedures : CPT4 Code Description Modifier [id number] 66063- WC PHYS HYPERBARIC OXYGEN THERAPY ICD-10 Diagnosis Description 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

## 2022-03-20 ENCOUNTER — Encounter (HOSPITAL_BASED_OUTPATIENT_CLINIC_OR_DEPARTMENT_OTHER): Payer: Medicare Other | Admitting: Internal Medicine

## 2022-03-20 DIAGNOSIS — C61 Malignant neoplasm of prostate: Secondary | ICD-10-CM | POA: Diagnosis not present

## 2022-03-20 DIAGNOSIS — Z923 Personal history of irradiation: Secondary | ICD-10-CM

## 2022-03-20 DIAGNOSIS — N3041 Irradiation cystitis with hematuria: Secondary | ICD-10-CM

## 2022-03-20 NOTE — Progress Notes (Addendum)
KAO, CONRY ([id number]) Visit Report for 03/20/2022 HBO Details Patient Name: Date of Service: Nathan [name] [name] 03/20/2022 1:00 PM Medical Record Number: [id number] Patient Account Number: [id number] Date of Birth/Sex: Treating RN: 20-Aug-1945 (76 y.o. 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 HBO Treatment Details Treatment Number: 2 Patient Type: Outpatient Chamber Type: Monoplace Chamber Serial #: [id number] Treatment Protocol: 2.0 ATA with 90 minutes oxygen, and no air breaks Treatment Details Compression Rate Down: 1.0 psi / minute De-Compression Rate Up: 1.0 psi / minute Air breaks and breathing Decompress Decompress Compress Tx Pressure Begins Reached periods Begins Ends (leave unused spaces blank) Chamber Pressure (ATA 1 2 ------2 1 ) Clock Time (24 hr) 12:49 13:06 - - - - - - 14:36 14:49 Treatment Length: 120 (minutes) Treatment Segments: 4 Vital Signs Capillary Blood Glucose Reference Range: 80 - 120 mg / dl HBO Diabetic Blood Glucose Intervention Range: <131 mg/dl or >249 mg/dl Type: Time Vitals Blood Respiratory Capillary Blood Glucose Pulse Action Pulse: Temperature: Taken: Pressure: Rate: Glucose (mg/dl): Meter #: Oximetry (%) Taken: Pre 12:44 142/82 79 18 97.9 none per protocol Post 14:52 124/89 65 16 98.4 none per protocol Treatment Response Treatment Toleration: Well Treatment Completion Status: Treatment Completed without Adverse Event Physician HBO Attestation: I certify that I supervised this HBO treatment in accordance with Medicare guidelines. A trained emergency response  team is readily available per Yes hospital policies and procedures. Continue HBOT as ordered. Yes Electronic Signature(s) Signed: 03/20/2022 4:16:01 PM By: Kalman Shan DO Previous Signature: 03/20/2022 3:20:25 PM Version By: [name] CHT EMT BS , , Entered By: Kalman Shan on 03/20/2022 15:38:10 -------------------------------------------------------------------------------- HBO Safety Checklist Details Patient Name: Date of Service: Nathan [name], [name] 03/20/2022 1:00 PM Medical Record Number: [id number] Patient Account Number: [id number] Date of Birth/Sex: Treating RN: 1945-08-22 (76 y.o. [name] Primary Care Marlia Schewe: [name] Other Clinician: [name] Referring Jeris Easterly: Treating Izaia Say/Extender: [name] in Treatment: 0 HBO Safety Checklist Items Safety Checklist Consent Form Signed Patient voided / foley secured and emptied When did you last eato 1000 Last dose of injectable or oral agent n/a Ostomy pouch emptied and vented if applicable NA Foley Emptied and secured All implantable devices assessed, documented and approved NA Intravenous access site secured and place NA Valuables secured Linens and cotton and cotton/polyester blend (less than 51% polyester) Personal oil-based products / skin lotions / body lotions removed Wigs or hairpieces removed NA Smoking or tobacco materials removed NA Books / newspapers / magazines / loose paper removed Cologne, aftershave, perfume and deodorant removed Jewelry removed (may wrap wedding band) Make-up removed NA Hair care products removed Battery operated devices (external) removed Heating patches and chemical warmers removed Titanium eyewear removed NA Nail polish cured greater than 10 hours NA Casting material cured greater than 10 hours NA Hearing aids removed NA Loose dentures or partials removed dentures removed Prosthetics have been  removed NA Patient demonstrates correct use of air break device (if applicable) Patient concerns have been addressed Patient grounding bracelet on and cord attached to chamber Specifics for Inpatients (complete in addition to above) Medication sheet sent with patient NA Intravenous medications needed or due during therapy sent with patient  NA Drainage tubes (e.g. nasogastric tube or chest tube secured and vented) NA Endotracheal or Tracheotomy tube secured NA Cuff deflated of air and inflated with saline NA Airway suctioned NA Notes Paper version used prior to treatment start. Electronic Signature(s) Signed: 03/20/2022 2:41:44 PM By: [name] CHT EMT BS , , Entered By: [name] on 03/20/2022 14:41:43

## 2022-03-20 NOTE — Progress Notes (Signed)
DAXEN, LANUM ([id number]) Visit Report for 03/20/2022 SuperBill Details Patient Name: Date of Service: Nathan [name] [name] 03/20/2022 Medical Record Number: [id number] Patient Account Number: [id number] Date of Birth/Sex: Treating RN: 12-Dec-1945 (76 y.o. 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

## 2022-03-20 NOTE — Progress Notes (Addendum)
Nathan [name] ([id number]) Visit Report for 03/20/2022 Arrival Information Details Patient Name: Date of Service: Nathan [name] [name] 03/20/2022 1:00 PM Medical Record Number: [id number] Patient Account Number: [id number] Date of Birth/Sex: Treating RN: 11-12-1945 (76 y.o. Nathan [name] Primary Care Acelynn Dejonge: [name] Other Clinician: [name] Referring Molly Maselli: Treating Myleen Brailsford/Extender: [name] in Treatment: 0 Visit Information History Since Last Visit All ordered tests and consults were completed: Yes Patient Arrived: Nathan [name] Added or deleted any medications: No Arrival Time: 12:15 Any new allergies or adverse reactions: No Accompanied By: self Had a fall or experienced change in No Transfer Assistance: None activities of daily living that may affect Patient Identification Verified: Yes risk of falls: Secondary Verification Process Completed: Yes Signs or symptoms of abuse/neglect since last visito No Patient Requires Transmission-Based Precautions: No Hospitalized since last visit: No Patient Has Alerts: No Implantable device outside of the clinic excluding No cellular tissue based products placed in the center since last visit: Pain Present Now: No Electronic Signature(s) Signed: 03/20/2022 2:38:56 PM By: [name] CHT EMT BS , , Previous Signature: 03/20/2022 2:34:11 PM Version By: [name] CHT EMT BS , , Entered By: [name] on 03/20/2022 14:38:55 -------------------------------------------------------------------------------- [place] Details Patient Name: Date of Service: Nathan [name], [name] 03/20/2022 1:00 PM Medical Record Number: [id number] Patient Account Number: [id number] Date of Birth/Sex: Treating RN: Jan 28, 1946 (76 y.o. Nathan [name] Primary Care Tine Mabee: [name] Other Clinician: [name] Referring Lachrista Heslin: Treating Auther Lyerly/Extender: [name] in Treatment: 0 Vital Signs Time Taken: 12:44 Temperature (F): 97.9 Height (in): 73 Pulse (bpm): 79 Weight (lbs): 183 Respiratory Rate (breaths/min): 18 Body Mass Index (BMI): 24.1 Blood Pressure (mmHg): 142/82 Reference Range: 80 - 120 mg / dl Electronic Signature(s) Signed: 03/20/2022 2:39:48 PM By: [name] CHT EMT BS , , Entered By: [name] on 03/20/2022 14:39:48

## 2022-03-21 ENCOUNTER — Encounter (HOSPITAL_BASED_OUTPATIENT_CLINIC_OR_DEPARTMENT_OTHER): Payer: Medicare Other | Admitting: General Surgery

## 2022-03-21 DIAGNOSIS — N3041 Irradiation cystitis with hematuria: Secondary | ICD-10-CM | POA: Diagnosis not present

## 2022-03-21 NOTE — Progress Notes (Signed)
Nathan, [name] ([id number]) Visit Report for 03/21/2022 HBO Details Patient Name: Date of Service: Nathan [name] [name] 03/21/2022 1:00 PM Medical Record Number: [id number] Patient Account Number: [id number] Date of Birth/Sex: Treating RN: [birth year] (76 y.o. 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 HBO Treatment Details Treatment Number: 3 Patient Type: Outpatient Chamber Type: Monoplace Chamber Serial #: [id number] Treatment Protocol: 2.0 ATA with 90 minutes oxygen, and no air breaks Treatment Details Compression Rate Down: 1.0 psi / minute De-Compression Rate Up: 1.0 psi / minute Air breaks and breathing Decompress Decompress Compress Tx Pressure Begins Reached periods Begins Ends (leave unused spaces blank) Chamber Pressure (ATA 1 2 ------2 1 ) Clock Time (24 hr) 12:36 12:50 - - - - - - 14:20 14:30 Treatment Length: 114 (minutes) Treatment Segments: 4 Vital Signs Capillary Blood Glucose Reference Range: 80 - 120 mg / dl HBO Diabetic Blood Glucose Intervention Range: <131 mg/dl or >249 mg/dl Time Vitals Blood Respiratory Capillary Blood Glucose Pulse Action Type: Pulse: Temperature: Taken: Pressure: Rate: Glucose (mg/dl): Meter #: Oximetry (%) Taken: Pre 12:32 153/87 75 16 98.2 Post 14:32 144/76 70 16 98.2 Treatment Response Treatment Toleration: Well Treatment Completion Status: Treatment Completed without Adverse Event Physician HBO Attestation: I certify that I supervised this HBO treatment in accordance with Medicare guidelines. A trained emergency response team is readily available per Yes hospital  policies and procedures. Continue HBOT as ordered. Yes Electronic Signature(s) Signed: 03/21/2022 4:09:56 PM By: [name] MD FACS Previous Signature: 03/21/2022 4:06:37 PM Version By: [name] EMT Entered By: [name] on 03/21/2022 16:09:56 -------------------------------------------------------------------------------- HBO Safety Checklist Details Patient Name: Date of Service: Nathan [name], [name] 03/21/2022 1:00 PM Medical Record Number: [id number] Patient Account Number: [id number] Date of Birth/Sex: Treating RN: Nov 10, 1945 (76 y.o. Nathan [name] Primary Care Eliyana Pagliaro: [name] Other Clinician: [name] Referring Tabias Swayze: Treating Ashten Prats/Extender: [name] in Treatment: 1 HBO Safety Checklist Items Safety Checklist Consent Form Signed Patient voided / foley secured and emptied When did you last eato 1000 Last dose of injectable or oral agent NA Ostomy pouch emptied and vented if applicable NA All implantable devices assessed, documented and approved NA Intravenous access site secured and place NA Valuables secured Linens and cotton and cotton/polyester blend (less than 51% polyester) Personal oil-based products / skin lotions / body lotions removed Wigs or hairpieces removed NA Smoking or tobacco materials removed Books / newspapers / magazines / loose paper removed Cologne, aftershave, perfume and deodorant removed Jewelry removed (may wrap wedding band) NA Make-up removed NA Hair care products removed Battery operated devices (external) removed Heating patches and chemical warmers removed Titanium eyewear removed NA Nail polish cured greater than 10 hours NA Casting material cured greater than 10 hours NA Hearing aids removed NA Loose dentures or partials removed NA Prosthetics have been removed NA Patient demonstrates correct use of air break device (if applicable) Patient concerns  have been addressed Patient grounding bracelet on and cord attached to chamber Specifics for Inpatients (complete in addition to above) Medication sheet sent with patient NA Intravenous medications needed or due during therapy sent with patient NA Drainage tubes (e.g. nasogastric tube or chest tube secured and vented) NA Endotracheal  or Tracheotomy tube secured NA Cuff deflated of air and inflated with saline NA Airway suctioned NA Notes The safety checklist was done before the treatment was started. Electronic Signature(s) Signed: 03/21/2022 4:04:43 PM By: [name] EMT Entered By: [name] on 03/21/2022 16:04:43

## 2022-03-21 NOTE — Progress Notes (Signed)
KISEAN, ROLLO ([id number]) Visit Report for 03/21/2022 Arrival Information Details Patient Name: Date of Service: [name] 03/21/2022 1:00 PM Medical Record Number: [id number] Patient Account Number: [id number] Date of Birth/Sex: Treating RN: 10-Mar-1946 (76 y.o. [name] Primary Care Tateanna Bach: [name] Other Clinician: [name] Referring Jeylin Woodmansee: Treating Moet Mikulski/Extender: [name] in Treatment: 1 Visit Information History Since Last Visit All ordered tests and consults were completed: Yes Patient Arrived: Ambulatory Added or deleted any medications: No Arrival Time: 12:15 Any new allergies or adverse reactions: No Accompanied By: None Had a fall or experienced change in No Transfer Assistance: None activities of daily living that may affect Patient Identification Verified: Yes risk of falls: Secondary Verification Process Completed: Yes Signs or symptoms of abuse/neglect since last visito No Patient Requires Transmission-Based Precautions: No Hospitalized since last visit: No Patient Has Alerts: No Implantable device outside of the clinic excluding No cellular tissue based products placed in the center since last visit: Pain Present Now: No Electronic Signature(s) Signed: 03/21/2022 4:02:10 PM By: [name] EMT Entered By: [name] on 03/21/2022 16:02:10 -------------------------------------------------------------------------------- Encounter Discharge Information Details Patient Name: Date of Service: [name], [name] 03/21/2022 1:00 PM Medical Record Number: [id number] Patient Account Number: [id number] Date of Birth/Sex: Treating RN: 1945/10/30 (76 y.o. [name] Primary Care Tila Millirons: [name] Other Clinician: [name] Referring Frankee Gritz: Treating Modesty Rudy/Extender: [name] in Treatment: 1 Encounter Discharge Information Items Discharge  Condition: Stable Ambulatory Status: Ambulatory Discharge Destination: Home Transportation: Private Auto Accompanied By: None Schedule Follow-up Appointment: Yes Clinical Summary of Care: Electronic Signature(s) Signed: 03/21/2022 4:08:48 PM By: [name] EMT Entered By: [name] on 03/21/2022 16:08:48 -------------------------------------------------------------------------------- [place] Details Patient Name: Date of Service: [name], [name] 03/21/2022 1:00 PM Medical Record Number: [id number] Patient Account Number: [id number] Date of Birth/Sex: Treating RN: [birth year] (76 y.o. [name] Primary Care Shaneen Reeser: [name] Other Clinician: [name] Referring Glendene Wyer: Treating Wiatt Mahabir/Extender: [name] in Treatment: 1 Vital Signs Time Taken: 12:32 Temperature (F): 98.2 Height (in): 73 Pulse (bpm): 75 Weight (lbs): 183 Respiratory Rate (breaths/min): 16 Body Mass Index (BMI): 24.1 Blood Pressure (mmHg): 153/87 Reference Range: 80 - 120 mg / dl Electronic Signature(s) Signed: 03/21/2022 4:02:38 PM By: [name] EMT Entered By: [name] on 03/21/2022 16:02:37

## 2022-03-21 NOTE — Progress Notes (Signed)
[name], [name] ([id number]) Visit Report for 03/21/2022 Problem List Details Patient Name: Date of Service: Nathan [name] [name] 03/21/2022 1:00 PM Medical Record Number: [id number] Patient Account Number: [id number] Date of Birth/Sex: Treating RN: Aug 10, 1945 (76 y.o. [name] Primary Care Provider: [name] Other Clinician: [name] Referring Provider: Treating Provider/Extender: [name] in Treatment: 1 Active Problems ICD-10 Encounter Code Description Active Date MDM Diagnosis N30.41 Irradiation cystitis with hematuria 03/14/2022 No Yes Z92.3 Personal history of irradiation 03/14/2022 No Yes C61 Malignant neoplasm of prostate 03/14/2022 No Yes I10 Essential (primary) hypertension 03/14/2022 No Yes I48.0 Paroxysmal atrial fibrillation 03/14/2022 No Yes Inactive Problems Resolved Problems Electronic Signature(s) Signed: 03/21/2022 4:07:52 PM By: [name] EMT Signed: 03/21/2022 4:08:58 PM By: [name] MD FACS Entered By: [name] on 03/21/2022 16:07:52 -------------------------------------------------------------------------------- SuperBill Details Patient Name: Date of Service: [name], [name] 03/21/2022 Medical Record Number: [id number] Patient Account Number: [id number] Date of Birth/Sex: Treating RN: [birth year] (76 y.o. [name] Primary Care Provider: [name] Other Clinician: [name] Referring Provider: Treating Provider/Extender: [name] in Treatment: 1 Diagnosis Coding ICD-10 Codes Code Description 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 Personal history of irradiation C61 Malignant neoplasm of prostate I10 Essential (primary) hypertension I48.0 Paroxysmal atrial fibrillation Facility Procedures CPT4 Code: 60109323 Description: G0277-(Facility Use Only) HBOT full body chamber, 73mn , ICD-10 Diagnosis Description N30.41 Irradiation  cystitis with hematuria Z92.3 Personal history of irradiation C61 Malignant neoplasm of prostate I10 Essential (primary) hypertension Modifier: Quantity: 4 Physician Procedures : CPT4 Code Description Modifier [id number] 25427- WC PHYS HYPERBARIC OXYGEN THERAPY ICD-10 Diagnosis Description 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 Personal history of irradiation C61 Malignant neoplasm of prostate I10 Essential (primary)  hypertension Quantity: 1 Electronic Signature(s) Signed: 03/21/2022 4:07:22 PM By: GValeria BatmanEMT Signed: 03/21/2022 4:08:58 PM By: CFredirick MaudlinMD FACS Entered By: GValeria Batmanon 03/21/2022 16:07:22

## 2022-03-22 ENCOUNTER — Encounter (HOSPITAL_BASED_OUTPATIENT_CLINIC_OR_DEPARTMENT_OTHER): Payer: Medicare Other | Admitting: General Surgery

## 2022-03-22 DIAGNOSIS — N3041 Irradiation cystitis with hematuria: Secondary | ICD-10-CM | POA: Diagnosis not present

## 2022-03-22 NOTE — Progress Notes (Addendum)
Nathan [name] ([id number]) Visit Report for 03/22/2022 Arrival Information Details Patient Name: Date of Service: Nathan [name] [name] 03/22/2022 1:00 PM Medical Record Number: [id number] Patient Account Number: [id number] Date of Birth/Sex: Treating RN: 25-Aug-1945 (76 y.o. [name] Primary Care Trannie Bardales: [name] Other Clinician: [name] Referring Elvin Mccartin: Treating Kalla Watson/Extender: [name] in Treatment: 1 Visit Information History Since Last Visit All ordered tests and consults were completed: Yes Patient Arrived: [name] Added or deleted any medications: No Arrival Time: 12:26 Any new allergies or adverse reactions: No Accompanied By: Self Had a fall or experienced change in No Transfer Assistance: None activities of daily living that may affect Patient Identification Verified: Yes risk of falls: Secondary Verification Process Completed: Yes Signs or symptoms of abuse/neglect since last visito No Patient Requires Transmission-Based Precautions: No Hospitalized since last visit: No Patient Has Alerts: No Implantable device outside of the clinic excluding No cellular tissue based products placed in the center since last visit: Pain Present Now: No Electronic Signature(s) Signed: 03/22/2022 6:14:37 PM By: [name] CHT EMT BS , , Previous Signature: 03/22/2022 4:29:38 PM Version By: [name] CHT EMT BS , , Entered By: [name] on 03/22/2022 18:14:37 -------------------------------------------------------------------------------- Encounter Discharge Information Details Patient Name: Date of Service: Nathan [name], [name] 03/22/2022 1:00 PM Medical Record Number: [id number] Patient Account Number: [id number] Date of Birth/Sex: Treating RN: [birth year] (76 y.o. [name] Primary Care Brenon Antosh: [name] Other Clinician: [name] Referring Winson Eichorn: Treating Destani Wamser/Extender:  [name] in Treatment: 1 Encounter Discharge Information Items Discharge Condition: Stable Ambulatory Status: Cane Discharge Destination: Home Transportation: Private Auto Accompanied By: self Schedule Follow-up Appointment: No Clinical Summary of Care: Electronic Signature(s) Signed: 03/22/2022 6:14:53 PM By: [name] CHT EMT BS , , Previous Signature: 03/22/2022 4:36:58 PM Version By: [name] CHT EMT BS , , Entered By: [name] on 03/22/2022 18:14:53 -------------------------------------------------------------------------------- [place] Details Patient Name: Date of Service: Nathan [name], [name] 03/22/2022 1:00 PM Medical Record Number: [id number] Patient Account Number: [id number] Date of Birth/Sex: Treating RN: 02/29/1946 (76 y.o. [name] Primary Care Torie Towle: [name] Other Clinician: [name] Referring Laresa Oshiro: Treating Stanley Lyness/Extender: [name] in Treatment: 1 Vital Signs Time Taken: 12:49 Temperature (F): 98.0 Height (in): 73 Pulse (bpm): 70 Weight (lbs): 183 Respiratory Rate (breaths/min): 18 Body Mass Index (BMI): 24.1 Blood Pressure (mmHg): 155/85 Reference Range: 80 - 120 mg / dl Electronic Signature(s) Signed: 03/22/2022 4:33:13 PM By: [name] CHT EMT BS , , Entered By: [name] on 03/22/2022 16:33:12

## 2022-03-22 NOTE — Progress Notes (Addendum)
Nathan [name] ([id number]) Visit Report for 03/22/2022 HBO Details Patient Name: Date of Service: Nathan [name] [name] 03/22/2022 1:00 PM Medical Record Number: [id number] Patient Account Number: [id number] Date of Birth/Sex: Treating RN: 1945/08/29 (76 y.o. 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 HBO Treatment Details Treatment Number: 4 Patient Type: Outpatient Chamber Type: Monoplace Chamber Serial #: [id number] Treatment Protocol: 2.0 ATA with 90 minutes oxygen, and no air breaks Treatment Details Compression Rate Down: 1.5 psi / minute De-Compression Rate Up: 1.5 psi / minute Air breaks and breathing Decompress Decompress Compress Tx Pressure Begins Reached periods Begins Ends (leave unused spaces blank) Chamber Pressure (ATA 1 2 ------2 1 ) Clock Time (24 hr) 12:52 13:05 - - - - - - 14:36 14:46 Treatment Length: 114 (minutes) Treatment Segments: 4 Vital Signs Capillary Blood Glucose Reference Range: 80 - 120 mg / dl HBO Diabetic Blood Glucose Intervention Range: <131 mg/dl or >249 mg/dl Type: Time Vitals Blood Respiratory Capillary Blood Glucose Pulse Action Pulse: Temperature: Taken: Pressure: Rate: Glucose (mg/dl): Meter #: Oximetry (%) Taken: Pre 12:49 155/85 70 18 98 none per protocol Post 14:48 156/85 66 18 97.9 none per protocol Treatment Response Treatment Toleration: Well Treatment Completion Status: Treatment Completed without Adverse Event Physician HBO Attestation: I certify that I supervised this HBO treatment in accordance with Medicare guidelines. A trained emergency response team is  readily available per Yes hospital policies and procedures. Continue HBOT as ordered. Yes Electronic Signature(s) Signed: 03/23/2022 7:37:55 AM By: [name] MD FACS Previous Signature: 03/22/2022 4:36:10 PM Version By: [name] CHT EMT BS , , Entered By: [name] on 03/23/2022 07:37:55 -------------------------------------------------------------------------------- HBO Safety Checklist Details Patient Name: Date of Service: Nathan [name], [name] 03/22/2022 1:00 PM Medical Record Number: [id number] Patient Account Number: [id number] Date of Birth/Sex: Treating RN: [birth year] (76 y.o. [name] Primary Care Tyshawn Keel: [name] Other Clinician: [name] Referring Agata Lucente: Treating Jenniffer Vessels/Extender: [name] in Treatment: 1 HBO Safety Checklist Items Safety Checklist Consent Form Signed Patient voided / foley secured and emptied When did you last eato 1030 Last dose of injectable or oral agent n/a Ostomy pouch emptied and vented if applicable NA All implantable devices assessed, documented and approved NA Intravenous access site secured and place NA Valuables secured Linens and cotton and cotton/polyester blend (less than 51% polyester) Personal oil-based products / skin lotions / body lotions removed Wigs or hairpieces removed NA Smoking or tobacco materials removed NA Books / newspapers / magazines / loose paper removed Cologne, aftershave, perfume and deodorant removed Jewelry removed (may wrap wedding band) Make-up removed NA Hair care products removed Battery operated devices (external) removed Heating patches and chemical warmers removed Titanium eyewear removed Nail polish cured greater than 10 hours NA Casting material cured greater than 10 hours NA Hearing aids removed NA Loose dentures or partials removed NA Prosthetics have been removed NA Patient demonstrates correct use of air  break device (if applicable) Patient concerns have been addressed Patient grounding bracelet on and cord attached to chamber Specifics for Inpatients (complete in addition to above) Medication sheet sent with patient NA Intravenous medications needed or due during therapy sent with patient NA Drainage tubes (e.g. nasogastric  tube or chest tube secured and vented) NA Endotracheal or Tracheotomy tube secured NA Cuff deflated of air and inflated with saline NA Airway suctioned NA Notes Paper version used prior to treatment. Electronic Signature(s) Signed: 03/22/2022 4:34:42 PM By: [name] CHT EMT BS , , Entered By: [name] on 03/22/2022 16:34:41

## 2022-03-23 ENCOUNTER — Encounter (HOSPITAL_BASED_OUTPATIENT_CLINIC_OR_DEPARTMENT_OTHER): Payer: Medicare Other | Admitting: General Surgery

## 2022-03-23 DIAGNOSIS — N3041 Irradiation cystitis with hematuria: Secondary | ICD-10-CM | POA: Diagnosis not present

## 2022-03-23 NOTE — Progress Notes (Addendum)
Nathan, [name] ([id number]) Visit Report for 03/23/2022 Arrival Information Details Patient Name: Date of Service: Nathan [name] [name] 03/23/2022 1:00 PM Medical Record Number: [id number] Patient Account Number: [id number] Date of Birth/Sex: Treating RN: 09-03-1945 (76 y.o. Nathan [name] Primary Care Orell Hurtado: [name] Other Clinician: [name] Referring Shana Zavaleta: Treating Genevie Elman/Extender: [name] in Treatment: 1 Visit Information History Since Last Visit All ordered tests and consults were completed: Yes Patient Arrived: Nathan [name] Added or deleted any medications: No Arrival Time: 12:23 Any new allergies or adverse reactions: No Accompanied By: self Had a fall or experienced change in No Transfer Assistance: None activities of daily living that may affect Patient Identification Verified: Yes risk of falls: Secondary Verification Process Completed: Yes Signs or symptoms of abuse/neglect since last visito No Patient Requires Transmission-Based Precautions: No Hospitalized since last visit: No Patient Has Alerts: No Implantable device outside of the clinic excluding No cellular tissue based products placed in the center since last visit: Pain Present Now: No Electronic Signature(s) Signed: 03/23/2022 2:10:12 PM By: [name] CHT EMT BS , , Entered By: [name] on 03/23/2022 14:10:12 -------------------------------------------------------------------------------- Encounter Discharge Information Details Patient Name: Date of Service: Nathan [name], [name] 03/23/2022 1:00 PM Medical Record Number: [id number] Patient Account Number: [id number] Date of Birth/Sex: Treating RN: March 07, 1946 (76 y.o. Nathan [name] Primary Care Heike Pounds: [name] Other Clinician: [name] Referring Yanique Mulvihill: Treating Darean Rote/Extender: [name] in Treatment: 1 Encounter Discharge  Information Items Discharge Condition: Stable Ambulatory Status: Cane Discharge Destination: Home Transportation: Private Auto Accompanied By: self Schedule Follow-up Appointment: No Clinical Summary of Care: Electronic Signature(s) Signed: 03/23/2022 2:54:46 PM By: [name] CHT EMT BS , , Entered By: [name] on 03/23/2022 14:54:46 -------------------------------------------------------------------------------- [facility] [place] Details Patient Name: Date of Service: Nathan [name], [name] 03/23/2022 1:00 PM Medical Record Number: [id number] Patient Account Number: [id number] Date of Birth/Sex: Treating RN: 1945/12/06 (76 y.o. Nathan [name] Primary Care Landry Lookingbill: [name] Other Clinician: [name] Referring Young Mulvey: Treating Jalyn Dutta/Extender: [name] in Treatment: 1 Vital Signs Time Taken: 12:45 Temperature (F): 97.4 Height (in): 73 Pulse (bpm): 81 Weight (lbs): 183 Respiratory Rate (breaths/min): 18 Body Mass Index (BMI): 24.1 Blood Pressure (mmHg): 139/80 Reference Range: 80 - 120 mg / dl Electronic Signature(s) Signed: 03/23/2022 2:10:43 PM By: [name] CHT EMT BS , , Entered By: [name] on 03/23/2022 14:10:43

## 2022-03-23 NOTE — Progress Notes (Addendum)
Nathan, [name] ([id number]) Visit Report for 03/23/2022 HBO Details Patient Name: Date of Service: Nathan [name] Nathan [name] 03/23/2022 1:00 PM Medical Record Number: [id number] Patient Account Number: [id number] Date of Birth/Sex: Treating RN: [birth year] (76 y.o. 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 HBO Treatment Details Treatment Number: 5 Patient Type: Outpatient Chamber Type: Monoplace Chamber Serial #: [id number] Treatment Protocol: 2.0 ATA with 90 minutes oxygen, and no air breaks Treatment Details Compression Rate Down: 2.0 psi / minute De-Compression Rate Up: 2.0 psi / minute Air breaks and breathing Decompress Decompress Compress Tx Pressure Begins Reached periods Begins Ends (leave unused spaces blank) Chamber Pressure (ATA 1 2 ------2 1 ) Clock Time (24 hr) 12:48 12:56 - - - - - - 14:26 14:34 Treatment Length: 106 (minutes) Treatment Segments: 4 Vital Signs Capillary Blood Glucose Reference Range: 80 - 120 mg / dl HBO Diabetic Blood Glucose Intervention Range: <131 mg/dl or >249 mg/dl Type: Time Vitals Blood Respiratory Capillary Blood Glucose Pulse Action Pulse: Temperature: Taken: Pressure: Rate: Glucose (mg/dl): Meter #: Oximetry (%) Taken: Pre 12:45 139/80 81 18 97.4 none per protocol Post 14:38 157/91 64 18 97.9 none per protocol Treatment Response Treatment Toleration: Well Treatment Completion Status: Treatment Completed without Adverse Event Physician HBO Attestation: I certify that I supervised this HBO treatment in accordance with Medicare guidelines. A trained emergency response team  is readily available per Yes hospital policies and procedures. Continue HBOT as ordered. Yes Electronic Signature(s) Signed: 03/23/2022 4:21:56 PM By: Nathan Maudlin MD FACS Previous Signature: 03/23/2022 2:53:55 PM Version By: Nathan [name] CHT EMT BS , , Previous Signature: 03/23/2022 2:18:42 PM Version By: Nathan [name] CHT EMT BS , , Entered By: Nathan [name] on 03/23/2022 16:21:56 -------------------------------------------------------------------------------- HBO Safety Checklist Details Patient Name: Date of Service: Nathan [name], [name] Nathan [name] 03/23/2022 1:00 PM Medical Record Number: [id number] Patient Account Number: [id number] Date of Birth/Sex: Treating RN: 1946/02/03 (76 y.o. Nathan [name] Primary Care Nathan [name]: Nathan [name] Other Clinician: Valeria [name] Referring Jahmia Berrett: Treating Nathan [name]/Extender: Nathan [name] in Treatment: 1 HBO Safety Checklist Items Safety Checklist Consent Form Signed Patient voided / foley secured and emptied Foley When did you last eato 1030 Last dose of injectable or oral agent n/a Ostomy pouch emptied and vented if applicable NA All implantable devices assessed, documented and approved NA Intravenous access site secured and place NA Valuables secured NA Linens and cotton and cotton/polyester blend (less than 51% polyester) Personal oil-based products / skin lotions / body lotions removed Wigs or hairpieces removed NA Smoking or tobacco materials removed NA Books / newspapers / magazines / loose paper removed Cologne, aftershave, perfume and deodorant removed Jewelry removed (may wrap wedding band) Make-up removed NA Hair care products removed Battery operated devices (external) removed Heating patches and chemical warmers removed Titanium eyewear removed Nail polish cured greater than 10 hours NA Casting material cured greater than 10 hours NA Hearing aids removed NA Loose  dentures or partials removed dentures removed Prosthetics have been removed NA Patient demonstrates correct use of air break device (if applicable) Patient concerns have been addressed Patient grounding bracelet on and cord attached to chamber Specifics for Inpatients (complete in addition to above) Medication sheet sent with  patient NA Intravenous medications needed or due during therapy sent with patient NA Drainage tubes (e.g. nasogastric tube or chest tube secured and vented) NA Endotracheal or Tracheotomy tube secured NA Cuff deflated of air and inflated with saline NA Airway suctioned NA Notes Paper version used prior to treatment. Electronic Signature(s) Signed: 03/23/2022 2:24:08 PM By: Nathan [name] CHT EMT BS , , Previous Signature: 03/23/2022 2:18:17 PM Version By: Nathan [name] CHT EMT BS , , Entered By: Nathan [name] on 03/23/2022 14:24:08

## 2022-03-23 NOTE — Progress Notes (Signed)
TYNAN, BOESEL ([id number]) Visit Report for 03/22/2022 SuperBill Details Patient Name: Date of Service: Nathan [name] [name] 03/22/2022 Medical Record Number: [id number] Patient Account Number: [id number] Date of Birth/Sex: Treating RN: 1945/11/27 (76 y.o. 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

## 2022-03-23 NOTE — Progress Notes (Signed)
AB, [name] ([id number]) Visit Report for 03/23/2022 SuperBill Details Patient Name: Date of Service: Nathan [name] [name] 03/23/2022 Medical Record Number: [id number] Patient Account Number: [id number] Date of Birth/Sex: Treating RN: [birth year] (77 y.o. 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

## 2022-03-26 ENCOUNTER — Encounter (HOSPITAL_BASED_OUTPATIENT_CLINIC_OR_DEPARTMENT_OTHER): Payer: Medicare Other | Admitting: Internal Medicine

## 2022-03-26 DIAGNOSIS — Z923 Personal history of irradiation: Secondary | ICD-10-CM

## 2022-03-26 DIAGNOSIS — N3041 Irradiation cystitis with hematuria: Secondary | ICD-10-CM | POA: Diagnosis not present

## 2022-03-26 DIAGNOSIS — C61 Malignant neoplasm of prostate: Secondary | ICD-10-CM | POA: Diagnosis not present

## 2022-03-26 NOTE — Progress Notes (Addendum)
Nathan, [name] ([id number]) Visit Report for 03/26/2022 HBO Details Patient Name: Date of Service: Nathan [name] [name] 03/26/2022 1:00 PM Medical Record Number: [id number] Patient Account Number: [id number] Date of Birth/Sex: Treating RN: 03/08/1946 (76 y.o. 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 HBO Treatment Details Treatment Number: 6 Patient Type: Outpatient Chamber Type: Monoplace Chamber Serial #: [id number] Treatment Protocol: 2.0 ATA with 90 minutes oxygen, and no air breaks Treatment Details Compression Rate Down: 1.5 psi / minute De-Compression Rate Up: 1.5 psi / minute Air breaks and breathing Decompress Decompress Compress Tx Pressure Begins Reached periods Begins Ends (leave unused spaces blank) Chamber Pressure (ATA 1 2 ------2 1 ) Clock Time (24 hr) 12:29 12:41 - - - - - - 14:11 14:21 Treatment Length: 112 (minutes) Treatment Segments: 4 Vital Signs Capillary Blood Glucose Reference Range: 80 - 120 mg / dl HBO Diabetic Blood Glucose Intervention Range: <131 mg/dl or >249 mg/dl Type: Time Vitals Blood Respiratory Capillary Blood Glucose Pulse Action Pulse: Temperature: Taken: Pressure: Rate: Glucose (mg/dl): Meter #: Oximetry (%) Taken: Pre 12:23 132/78 93 20 98.2 none per protocol Post 14:26 136/72 69 18 98 none per protocol Treatment Response Treatment Toleration: Well Treatment Completion Status: Treatment Completed without Adverse Event Physician HBO Attestation: I certify that I supervised this HBO treatment in accordance with Medicare guidelines. A trained emergency response team  is readily available per Yes hospital policies and procedures. Continue HBOT as ordered. Yes Electronic Signature(s) Signed: 03/26/2022 4:01:39 PM By: Kalman Shan DO Previous Signature: 03/26/2022 2:43:28 PM Version By: [name] CHT EMT BS , , Entered By: Kalman Shan on 03/26/2022 16:01:10 -------------------------------------------------------------------------------- HBO Safety Checklist Details Patient Name: Date of Service: Nathan [name], [name] 03/26/2022 1:00 PM Medical Record Number: [id number] Patient Account Number: [id number] Date of Birth/Sex: Treating RN: Nov 29, 1945 (76 y.o. Nathan [name], [name] Primary Care Punam Broussard: [name] Other Clinician: [name] Referring Derian Dimalanta: Treating Krist Rosenboom/Extender: [name] in Treatment: 1 HBO Safety Checklist Items Safety Checklist Consent Form Signed Patient voided / foley secured and emptied Foley When did you last eato 1030 Last dose of injectable or oral agent n/a Ostomy pouch emptied and vented if applicable NA All implantable devices assessed, documented and approved NA Intravenous access site secured and place NA Valuables secured Linens and cotton and cotton/polyester blend (less than 51% polyester) Personal oil-based products / skin lotions / body lotions removed Wigs or hairpieces removed NA Smoking or tobacco materials removed NA Books / newspapers / magazines / loose paper removed Cologne, aftershave, perfume and deodorant removed Jewelry removed (may wrap wedding band) Make-up removed NA Hair care products removed Battery operated devices (external) removed Heating patches and chemical warmers removed Titanium eyewear removed NA Nail polish cured greater than 10 hours NA Casting material cured greater than 10 hours NA Hearing aids removed NA Loose dentures or partials removed left at home Prosthetics have been removed NA Patient  demonstrates correct use of air break device (if applicable) Patient concerns have been addressed Patient grounding bracelet on and cord attached to chamber Specifics for Inpatients (complete in addition to above) Medication sheet sent with patient NA Intravenous medications needed or due during therapy sent with patient NA Drainage  tubes (e.g. nasogastric tube or chest tube secured and vented) NA Endotracheal or Tracheotomy tube secured NA Cuff deflated of air and inflated with saline NA Airway suctioned NA Notes Paper version used prior to treatment. Electronic Signature(s) Signed: 03/26/2022 1:20:14 PM By: [name] CHT EMT BS , , Entered By: [name] on 03/26/2022 13:20:14

## 2022-03-26 NOTE — Progress Notes (Signed)
[name], [name] ([id number]) Visit Report for 03/26/2022 SuperBill Details Patient Name: Date of Service: [name] 03/26/2022 Medical Record Number: [id number] Patient Account Number: [id number] Date of Birth/Sex: Treating RN: 20-Oct-1945 (76 y.o. 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

## 2022-03-26 NOTE — Progress Notes (Addendum)
Nathan, [name] ([id number]) Visit Report for 03/26/2022 Arrival Information Details Patient Name: Date of Service: Nathan [name] [name] 03/26/2022 1:00 PM Medical Record Number: [id number] Patient Account Number: [id number] Date of Birth/Sex: Treating RN: [birth year] (76 y.o. Nathan [name], [name] Primary Care Donia Yokum: [name] Other Clinician: [name] Referring Cory Kitt: Treating Ariday Brinker/Extender: [name] in Treatment: 1 Visit Information History Since Last Visit All ordered tests and consults were completed: Yes Patient Arrived: Ambulatory Added or deleted any medications: No Arrival Time: 12:23 Any new allergies or adverse reactions: No Accompanied By: self Had a fall or experienced change in No Transfer Assistance: None activities of daily living that may affect Patient Identification Verified: Yes risk of falls: Secondary Verification Process Completed: Yes Signs or symptoms of abuse/neglect since last visito No Patient Requires Transmission-Based Precautions: No Hospitalized since last visit: No Patient Has Alerts: No Implantable device outside of the clinic excluding No cellular tissue based products placed in the center since last visit: Pain Present Now: No Electronic Signature(s) Signed: 03/26/2022 1:17:22 PM By: [name] CHT EMT BS , , Entered By: [name] on 03/26/2022 13:17:22 -------------------------------------------------------------------------------- Encounter Discharge Information Details Patient Name: Date of Service: Nathan [name], [name] 03/26/2022 1:00 PM Medical Record Number: [id number] Patient Account Number: [id number] Date of Birth/Sex: Treating RN: 1945-08-20 (76 y.o. Nathan [name], [name] Primary Care Dayquan Buys: [name] Other Clinician: [name] Referring Marlin Jarrard: Treating Aliviya Schoeller/Extender: [name] in Treatment: 1 Encounter Discharge  Information Items Discharge Condition: Stable Ambulatory Status: Ambulatory Discharge Destination: Home Transportation: Private Auto Accompanied By: self Schedule Follow-up Appointment: No Clinical Summary of Care: Electronic Signature(s) Signed: 03/26/2022 2:44:19 PM By: [name] CHT EMT BS , , Entered By: [name] on 03/26/2022 14:44:18 -------------------------------------------------------------------------------- [place] Details Patient Name: Date of Service: Nathan [name], [name] 03/26/2022 1:00 PM Medical Record Number: [id number] Patient Account Number: [id number] Date of Birth/Sex: Treating RN: 09/26/1945 (76 y.o. Nathan [name], [name] Primary Care Alazia Crocket: [name] Other Clinician: [name] Referring Paiden Cavell: Treating Zaevion Parke/Extender: [name] in Treatment: 1 Vital Signs Time Taken: 12:23 Temperature (F): 98.2 Height (in): 73 Pulse (bpm): 93 Weight (lbs): 183 Respiratory Rate (breaths/min): 20 Body Mass Index (BMI): 24.1 Blood Pressure (mmHg): 132/78 Reference Range: 80 - 120 mg / dl Electronic Signature(s) Signed: 03/26/2022 1:17:42 PM By: [name] CHT EMT BS , , Entered By: [name] on 03/26/2022 13:17:42

## 2022-03-27 ENCOUNTER — Encounter (HOSPITAL_BASED_OUTPATIENT_CLINIC_OR_DEPARTMENT_OTHER): Payer: Medicare Other | Admitting: Internal Medicine

## 2022-03-27 DIAGNOSIS — Z923 Personal history of irradiation: Secondary | ICD-10-CM

## 2022-03-27 DIAGNOSIS — I10 Essential (primary) hypertension: Secondary | ICD-10-CM | POA: Diagnosis not present

## 2022-03-27 DIAGNOSIS — C61 Malignant neoplasm of prostate: Secondary | ICD-10-CM

## 2022-03-27 DIAGNOSIS — N3041 Irradiation cystitis with hematuria: Secondary | ICD-10-CM | POA: Diagnosis not present

## 2022-03-28 ENCOUNTER — Encounter (HOSPITAL_BASED_OUTPATIENT_CLINIC_OR_DEPARTMENT_OTHER): Payer: Medicare Other | Admitting: General Surgery

## 2022-03-28 DIAGNOSIS — N3041 Irradiation cystitis with hematuria: Secondary | ICD-10-CM | POA: Diagnosis not present

## 2022-03-28 NOTE — Progress Notes (Signed)
ALCEE, SIPOS ([id number]) Visit Report for 03/28/2022 Problem List Details Patient Name: Date of Service: Nathan [name] Nathan [name] 03/28/2022 1:00 PM Medical Record Number: [id number] Patient Account Number: [id number] Date of Birth/Sex: Treating RN: 05-10-1946 (76 y.o. Nathan [name], [name] Primary Care Provider: [name] Other Clinician: [name] Referring Provider: Treating Provider/Extender: [name] in Treatment: 2 Active Problems ICD-10 Encounter Code Description Active Date MDM Diagnosis N30.41 Irradiation cystitis with hematuria 03/14/2022 No Yes Z92.3 Personal history of irradiation 03/14/2022 No Yes C61 Malignant neoplasm of prostate 03/14/2022 No Yes I10 Essential (primary) hypertension 03/14/2022 No Yes I48.0 Paroxysmal atrial fibrillation 03/14/2022 No Yes Inactive Problems Resolved Problems Electronic Signature(s) Signed: 03/28/2022 2:47:42 PM By: [name] EMT Signed: 03/28/2022 3:45:58 PM By: [name] MD FACS Entered By: [name] on 03/28/2022 14:47:42 -------------------------------------------------------------------------------- [place] Details Patient Name: Date of Service: Nathan [name], Nathan [name] Nathan [name] 03/28/2022 Medical Record Number: [id number] Patient Account Number: [id number] Date of Birth/Sex: Treating RN: 08/09/1945 (76 y.o. 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 I10 Essential (primary) hypertension I48.0 Paroxysmal atrial fibrillation Facility Procedures CPT4 Code: [id number] Description: G0277-(Facility Use Only) HBOT full body chamber, 77mn , ICD-10 Diagnosis Description N30.41 Irradiation  cystitis with hematuria Z92.3 Personal history of irradiation C61 Malignant neoplasm of prostate Modifier: Quantity: 4 Physician Procedures : CPT4 Code Description Modifier [id number] 87681- WC PHYS HYPERBARIC OXYGEN THERAPY ICD-10 Diagnosis Description 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

## 2022-03-28 NOTE — Progress Notes (Addendum)
RYLON, POITRA ([id number]) Visit Report for 03/28/2022 HBO Details Patient Name: Date of Service: Nathan [name] [name] 03/28/2022 1:00 PM Medical Record Number: [id number] Patient Account Number: [id number] Date of Birth/Sex: Treating RN: 1946/02/25 (76 y.o. 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 HBO Treatment Details Treatment Number: 8 Patient Type: Outpatient Chamber Type: Monoplace Chamber Serial #: [id number] Treatment Protocol: 2.0 ATA with 90 minutes oxygen, and no air breaks Treatment Details Compression Rate Down: 2.0 psi / minute De-Compression Rate Up: 2.0 psi / minute Air breaks and breathing Decompress Decompress Compress Tx Pressure Begins Reached periods Begins Ends (leave unused spaces blank) Chamber Pressure (ATA 1 2 ------2 1 ) Clock Time (24 hr) 12:36 12:46 - - - - - - 14:17 14:24 Treatment Length: 108 (minutes) Treatment Segments: 4 Vital Signs Capillary Blood Glucose Reference Range: 80 - 120 mg / dl HBO Diabetic Blood Glucose Intervention Range: <131 mg/dl or >249 mg/dl Time Vitals Blood Respiratory Capillary Blood Glucose Pulse Action Type: Pulse: Temperature: Taken: Pressure: Rate: Glucose (mg/dl): Meter #: Oximetry (%) Taken: Pre 12:31 131/81 90 18 97.6 Post 14:26 153/78 73 16 97.5 Treatment Response Treatment Toleration: Well Treatment Completion Status: Treatment Completed without Adverse Event Physician HBO Attestation: I certify that I supervised this HBO treatment in accordance with Medicare guidelines. A trained emergency response team is readily available per Yes hospital  policies and procedures. Continue HBOT as ordered. Yes Electronic Signature(s) Signed: 03/28/2022 3:48:14 PM By: [name] MD FACS Previous Signature: 03/28/2022 2:45:52 PM Version By: [name] EMT Entered By: [name] on 03/28/2022 15:48:14 -------------------------------------------------------------------------------- HBO Safety Checklist Details Patient Name: Date of Service: [name], [name] 03/28/2022 1:00 PM Medical Record Number: [id number] Patient Account Number: [id number] Date of Birth/Sex: Treating RN: 30-Jun-1946 (76 y.o. [name], [name] Primary Care Kimika Streater: [name] Other Clinician: [name] Referring Paton Crum: Treating Juandiego Kolenovic/Extender: [name] in Treatment: 2 HBO Safety Checklist Items Safety Checklist Consent Form Signed Patient voided / foley secured and emptied When did you last eato 0945 Last dose of injectable or oral agent NA Ostomy pouch emptied and vented if applicable NA All implantable devices assessed, documented and approved Foley cath to leg bag Intravenous access site secured and place NA Valuables secured Linens and cotton and cotton/polyester blend (less than 51% polyester) Personal oil-based products / skin lotions / body lotions removed Wigs or hairpieces removed NA Smoking or tobacco materials removed Books / newspapers / magazines / loose paper removed Cologne, aftershave, perfume and deodorant removed Jewelry removed (may wrap wedding band) NA Make-up removed NA Hair care products removed Battery operated devices (external) removed Heating patches and chemical warmers removed Titanium eyewear removed NA Nail polish cured greater than 10 hours NA Casting material cured greater than 10 hours NA Hearing aids removed NA Loose dentures or partials removed NA Prosthetics have been removed NA Patient demonstrates correct use of air break device (if  applicable) Patient concerns have been addressed Patient grounding bracelet on and cord attached to chamber Specifics for Inpatients (complete in addition to above) Medication sheet sent with patient NA Intravenous medications needed or due during therapy sent with patient NA Drainage tubes (e.g. nasogastric tube or chest tube secured  and vented) NA Endotracheal or Tracheotomy tube secured NA Cuff deflated of air and inflated with saline NA Airway suctioned NA Notes The safety checklist was done before the treatment was started. Electronic Signature(s) Signed: 03/28/2022 2:44:48 PM By: [name] EMT Entered By: [name] on 03/28/2022 14:44:48

## 2022-03-28 NOTE — Progress Notes (Addendum)
Nathan [name], Nathan [name] ([id number]) Visit Report for 03/28/2022 Arrival Information Details Patient Name: Date of Service: Nathan [name] Nathan [name] 03/28/2022 1:00 PM Medical Record Number: [id number] Patient Account Number: [id number] Date of Birth/Sex: Treating RN: [birth year] (76 y.o. Nathan [name], [name] Primary Care Lindell Tussey: [name] Other Clinician: [name] Referring Haylen Bellotti: Treating Winston Sobczyk/Extender: [name] in Treatment: 2 Visit Information History Since Last Visit All ordered tests and consults were completed: Yes Patient Arrived: Ambulatory Added or deleted any medications: No Arrival Time: 12:21 Any new allergies or adverse reactions: No Accompanied By: None Had a fall or experienced change in No Transfer Assistance: None activities of daily living that may affect Patient Identification Verified: Yes risk of falls: Secondary Verification Process Completed: Yes Signs or symptoms of abuse/neglect since last visito No Patient Requires Transmission-Based Precautions: No Hospitalized since last visit: No Patient Has Alerts: No Implantable device outside of the clinic excluding No cellular tissue based products placed in the center since last visit: Pain Present Now: No Electronic Signature(s) Signed: 03/28/2022 2:40:23 PM By: [name] EMT Entered By: [name] on 03/28/2022 14:40:23 -------------------------------------------------------------------------------- Encounter Discharge Information Details Patient Name: Date of Service: Nathan [name], Nathan [name] 03/28/2022 1:00 PM Medical Record Number: [id number] Patient Account Number: [id number] Date of Birth/Sex: Treating RN: [birth year] (76 y.o. [name] Primary Care Tahesha Skeet: [name] Other Clinician: [name] Referring Kaliana Albino: Treating Mathan Darroch/Extender: [name] in Treatment: 2 Encounter Discharge Information Items Discharge  Condition: Stable Ambulatory Status: Ambulatory Discharge Destination: Home Transportation: Private Auto Accompanied By: None Schedule Follow-up Appointment: Yes Clinical Summary of Care: Electronic Signature(s) Signed: 03/28/2022 2:48:30 PM By: [name] EMT Entered By: [name] on 03/28/2022 14:48:30 -------------------------------------------------------------------------------- Vitals Details Patient Name: Date of Service: Nathan [name], Nathan [name] 03/28/2022 1:00 PM Medical Record Number: [id number] Patient Account Number: [id number] Date of Birth/Sex: Treating RN: 24-Feb-1946 (76 y.o. Nathan [name], [name] Primary Care Mckaylah Bettendorf: [name] Other Clinician: [name] Referring Ayala Ribble: Treating Halle Davlin/Extender: [name] in Treatment: 2 Vital Signs Time Taken: 12:31 Temperature (F): 97.6 Height (in): 73 Pulse (bpm): 90 Weight (lbs): 183 Respiratory Rate (breaths/min): 18 Body Mass Index (BMI): 24.1 Blood Pressure (mmHg): 131/81 Reference Range: 80 - 120 mg / dl Electronic Signature(s) Signed: 03/28/2022 2:41:58 PM By: [name] EMT Entered By: [name] on 03/28/2022 14:41:57

## 2022-03-29 ENCOUNTER — Encounter (HOSPITAL_BASED_OUTPATIENT_CLINIC_OR_DEPARTMENT_OTHER): Payer: Medicare Other | Admitting: General Surgery

## 2022-03-29 DIAGNOSIS — N3041 Irradiation cystitis with hematuria: Secondary | ICD-10-CM | POA: Diagnosis not present

## 2022-03-29 NOTE — Progress Notes (Signed)
Nathan [name] ([id number]) Visit Report for 03/29/2022 Arrival Information Details Patient Name: Date of Service: Nathan [name] Nathan [name] 03/29/2022 1:00 PM Medical Record Number: [id number] Patient Account Number: [id number] Date of Birth/Sex: Treating RN: 1945/11/06 (76 y.o. Nathan [name] Primary Care Ivann Trimarco: [name] Other Clinician: [name] Referring Melanny Wire: Treating Dniya Neuhaus/Extender: [name] in Treatment: 2 Visit Information History Since Last Visit All ordered tests and consults were completed: Yes Patient Arrived: Ambulatory Added or deleted any medications: No Arrival Time: 11:57 Any new allergies or adverse reactions: No Accompanied By: None Had a fall or experienced change in No Transfer Assistance: None activities of daily living that may affect Patient Identification Verified: Yes risk of falls: Secondary Verification Process Completed: Yes Signs or symptoms of abuse/neglect since last visito No Patient Requires Transmission-Based Precautions: No Hospitalized since last visit: No Patient Has Alerts: No Implantable device outside of the clinic excluding No cellular tissue based products placed in the center since last visit: Pain Present Now: No Electronic Signature(s) Signed: 03/29/2022 3:00:59 PM By: [name] EMT Entered By: [name] on 03/29/2022 15:00:59 -------------------------------------------------------------------------------- Encounter Discharge Information Details Patient Name: Date of Service: Nathan [name] 03/29/2022 1:00 PM Medical Record Number: [id number] Patient Account Number: [id number] Date of Birth/Sex: Treating RN: 1946/03/10 (76 y.o. Nathan [name] Primary Care Samaya Boardley: [name] Other Clinician: [name] Referring Hudson Majkowski: Treating Kween Bacorn/Extender: [name] in Treatment: 2 Encounter Discharge Information Items Discharge  Condition: Stable Ambulatory Status: Ambulatory Discharge Destination: Home Transportation: Private Auto Accompanied By: None Schedule Follow-up Appointment: Yes Clinical Summary of Care: Electronic Signature(s) Signed: 03/29/2022 3:05:35 PM By: [name] EMT Entered By: [name] on 03/29/2022 15:05:34 -------------------------------------------------------------------------------- [place] Details Patient Name: Date of Service: Nathan [name] 03/29/2022 1:00 PM Medical Record Number: [id number] Patient Account Number: [id number] Date of Birth/Sex: Treating RN: July 11, 1945 (76 y.o. Nathan [name] Primary Care Jaicee Michelotti: [name] Other Clinician: [name] Referring Opaline Reyburn: Treating Nathan [name]/Extender: [name] in Treatment: 2 Vital Signs Time Taken: 12:18 Temperature (F): 97.9 Height (in): 73 Pulse (bpm): 82 Weight (lbs): 183 Respiratory Rate (breaths/min): 16 Body Mass Index (BMI): 24.1 Blood Pressure (mmHg): 133/73 Reference Range: 80 - 120 mg / dl Electronic Signature(s) Signed: 03/29/2022 3:01:29 PM By: [name] EMT Entered By: [name] on 03/29/2022 15:01:29

## 2022-03-29 NOTE — Progress Notes (Addendum)
Nathan, [name] ([id number]) Visit Report for 03/29/2022 HBO Details Patient Name: Date of Service: Nathan [name] 03/29/2022 1:00 PM Medical Record Number: [id number] Patient Account Number: [id number] Date of Birth/Sex: Treating RN: Feb 27, 1946 (76 y.o. 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 HBO Treatment Details Treatment Number: 9 Patient Type: Outpatient Chamber Type: Monoplace Chamber Serial #: [id number] Treatment Protocol: 2.0 ATA with 90 minutes oxygen, and no air breaks Treatment Details Compression Rate Down: 2.0 psi / minute De-Compression Rate Up: 2.0 psi / minute Air breaks and breathing Decompress Decompress Compress Tx Pressure Begins Reached periods Begins Ends (leave unused spaces blank) Chamber Pressure (ATA 1 2 ------2 1 ) Clock Time (24 hr) 12:23 12:33 - - - - - - 14:03 14:12 Treatment Length: 109 (minutes) Treatment Segments: 4 Vital Signs Capillary Blood Glucose Reference Range: 80 - 120 mg / dl HBO Diabetic Blood Glucose Intervention Range: <131 mg/dl or >249 mg/dl Time Vitals Blood Respiratory Capillary Blood Glucose Pulse Action Type: Pulse: Temperature: Taken: Pressure: Rate: Glucose (mg/dl): Meter #: Oximetry (%) Taken: Pre 12:18 133/73 82 16 97.9 Post 14:14 140/71 68 16 97.7 Treatment Response Treatment Toleration: Well Treatment Completion Status: Treatment Completed without Adverse Event Physician HBO Attestation: I certify that I supervised this HBO treatment in accordance with Medicare guidelines. A trained emergency response team is readily available per Yes hospital  policies and procedures. Continue HBOT as ordered. Yes Electronic Signature(s) Signed: 03/29/2022 3:54:55 PM By: [name] MD FACS Previous Signature: 03/29/2022 3:04:41 PM Version By: [name] EMT Entered By: [name] on 03/29/2022 15:54:55 -------------------------------------------------------------------------------- HBO Safety Checklist Details Patient Name: Date of Service: Nathan [name], Nathan [name] 03/29/2022 1:00 PM Medical Record Number: [id number] Patient Account Number: [id number] Date of Birth/Sex: Treating RN: Nathan [name] Primary Care Elham Fini: [name] Other Clinician: [name] Referring Cynthia Cogle: Treating Linet Brash/Extender: [name] in Treatment: 2 HBO Safety Checklist Items Safety Checklist Consent Form Signed Patient voided / foley secured and emptied When did you last eato 1030 Last dose of injectable or oral agent NA Ostomy pouch emptied and vented if applicable NA All implantable devices assessed, documented and approved foley cath to leg bag Intravenous access site secured and place NA Valuables secured Linens and cotton and cotton/polyester blend (less than 51% polyester) Personal oil-based products / skin lotions / body lotions removed Wigs or hairpieces removed NA Smoking or tobacco materials removed Books / newspapers / magazines / loose paper removed Cologne, aftershave, perfume and deodorant removed Jewelry removed (may wrap wedding band) NA Make-up removed NA Hair care products removed Battery operated devices (external) removed Heating patches and chemical warmers removed Titanium eyewear removed NA Nail polish cured greater than 10 hours NA Casting material cured greater than 10 hours NA Hearing aids removed NA Loose dentures or partials removed removed by patient Prosthetics have been removed NA Patient demonstrates correct use of air break device (if  applicable) Patient concerns have been addressed Patient grounding bracelet on and cord attached to chamber Specifics for Inpatients (complete in addition to above) Medication sheet sent with patient NA Intravenous medications needed or due during therapy sent with patient NA Drainage tubes (e.g. nasogastric tube or chest  tube secured and vented) NA Endotracheal or Tracheotomy tube secured NA Cuff deflated of air and inflated with saline NA Airway suctioned NA Notes The safety checklist was done before the treatment was started. Electronic Signature(s) Signed: 03/29/2022 3:02:56 PM By: [name] EMT Entered By: [name] on 03/29/2022 15:02:56

## 2022-03-29 NOTE — Progress Notes (Signed)
SARGENT, MANKEY ([id number]) Visit Report for 03/29/2022 Problem List Details Patient Name: Date of Service: Nathan [name] [name] 03/29/2022 1:00 PM Medical Record Number: [id number] Patient Account Number: [id number] Date of Birth/Sex: Treating RN: 11-14-1945 (76 y.o. [name] Primary Care Provider: [name] Other Clinician: [name] Referring Provider: Treating Provider/Extender: [name] in Treatment: 2 Active Problems ICD-10 Encounter Code Description Active Date MDM Diagnosis N30.41 Irradiation cystitis with hematuria 03/14/2022 No Yes Z92.3 Personal history of irradiation 03/14/2022 No Yes C61 Malignant neoplasm of prostate 03/14/2022 No Yes I10 Essential (primary) hypertension 03/14/2022 No Yes I48.0 Paroxysmal atrial fibrillation 03/14/2022 No Yes Inactive Problems Resolved Problems Electronic Signature(s) Signed: 03/29/2022 3:05:06 PM By: [name] EMT Signed: 03/29/2022 3:54:34 PM By: [name] MD FACS Entered By: [name] on 03/29/2022 15:05:06 -------------------------------------------------------------------------------- SuperBill Details Patient Name: Date of Service: Nathan [name], [name] 03/29/2022 Medical Record Number: [id number] Patient Account Number: [id number] Date of Birth/Sex: Treating RN: Mar 17, 1946 (76 y.o. [name] Primary Care Provider: [name] Other Clinician: [name] Referring Provider: Treating Provider/Extender: [name] in Treatment: 2 Diagnosis Coding ICD-10 Codes Code Description 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 Personal history of irradiation C61 Malignant neoplasm of prostate I10 Essential (primary) hypertension I48.0 Paroxysmal atrial fibrillation Facility Procedures CPT4 Code: 62035597 Description: G0277-(Facility Use Only) HBOT full body chamber, 27mn , ICD-10 Diagnosis Description N30.41 Irradiation  cystitis with hematuria Z92.3 Personal history of irradiation C61 Malignant neoplasm of prostate Modifier: Quantity: 4 Physician Procedures : CPT4 Code Description Modifier [id number]- WC PHYS HYPERBARIC OXYGEN THERAPY ICD-10 Diagnosis Description 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 Personal history of irradiation C61 Malignant neoplasm of prostate Quantity: 1 Electronic Signature(s) Signed: 03/29/2022 3:05:02 PM By: GValeria BatmanEMT Signed: 03/29/2022 3:54:34 PM By: CFredirick MaudlinMD FACS Entered By: GValeria Batmanon 03/29/2022 15:05:01

## 2022-03-30 ENCOUNTER — Encounter (HOSPITAL_BASED_OUTPATIENT_CLINIC_OR_DEPARTMENT_OTHER): Payer: Medicare Other | Admitting: General Surgery

## 2022-03-30 DIAGNOSIS — N3041 Irradiation cystitis with hematuria: Secondary | ICD-10-CM | POA: Diagnosis not present

## 2022-03-30 NOTE — Progress Notes (Signed)
NOLE, ROBEY ([id number]) Visit Report for 03/30/2022 HBO Details Patient Name: Date of Service: Nathan [name] [name] 03/30/2022 1:00 PM Medical Record Number: [id number] Patient Account Number: [id number] Date of Birth/Sex: Treating RN: 1946/02/12 (76 y.o. 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 HBO Treatment Details Treatment Number: 10 Patient Type: Outpatient Chamber Type: Monoplace Chamber Serial #: [id number] Treatment Protocol: 2.0 ATA with 90 minutes oxygen, and no air breaks Treatment Details Compression Rate Down: 2.0 psi / minute De-Compression Rate Up: 2.0 psi / minute Air breaks and breathing Decompress Decompress Compress Tx Pressure Begins Reached periods Begins Ends (leave unused spaces blank) Chamber Pressure (ATA 1 2 ------2 1 ) Clock Time (24 hr) 12:33 12:42 - - - - - - 14:12 14:19 Treatment Length: 106 (minutes) Treatment Segments: 4 Vital Signs Capillary Blood Glucose Reference Range: 80 - 120 mg / dl HBO Diabetic Blood Glucose Intervention Range: <131 mg/dl or >249 mg/dl Time Vitals Blood Respiratory Capillary Blood Glucose Pulse Action Type: Pulse: Temperature: Taken: Pressure: Rate: Glucose (mg/dl): Meter #: Oximetry (%) Taken: Pre 12:27 139/81 92 16 97.9 Post 14:22 137/75 73 18 97.8 Treatment Response Treatment Toleration: Well Treatment Completion Status: Treatment Completed without Adverse Event Physician HBO Attestation: I certify that I supervised this HBO treatment in accordance with Medicare guidelines. A trained emergency response team is readily available per Yes hospital  policies and procedures. Continue HBOT as ordered. Yes Electronic Signature(s) Signed: 04/02/2022 7:35:18 AM By: [name] MD FACS Previous Signature: 03/30/2022 3:29:46 PM Version By: [name] EMT Entered By: [name] on 04/02/2022 07:35:18 -------------------------------------------------------------------------------- HBO Safety Checklist Details Patient Name: Date of Service: Nathan [name], [name] 03/30/2022 1:00 PM Medical Record Number: [id number] Patient Account Number: [id number] Date of Birth/Sex: Treating RN: Apr 01, 1946 (76 y.o. [name] Primary Care Marybella Ethier: [name] Other Clinician: [name] Referring Jahaira Earnhart: Treating Maridee Slape/Extender: [name] in Treatment: 2 HBO Safety Checklist Items Safety Checklist Consent Form Signed Patient voided / foley secured and emptied When did you last eato 1130 Last dose of injectable or oral agent NA Ostomy pouch emptied and vented if applicable NA All implantable devices assessed, documented and approved foley cath to leg bag Intravenous access site secured and place NA Valuables secured Linens and cotton and cotton/polyester blend (less than 51% polyester) Personal oil-based products / skin lotions / body lotions removed Wigs or hairpieces removed NA Smoking or tobacco materials removed Books / newspapers / magazines / loose paper removed Cologne, aftershave, perfume and deodorant removed Jewelry removed (may wrap wedding band) NA Make-up removed NA Hair care products removed Battery operated devices (external) removed Heating patches and chemical warmers removed Titanium eyewear removed NA Nail polish cured greater than 10 hours NA Casting material cured greater than 10 hours NA Hearing aids removed NA Loose dentures or partials removed removed by patient Prosthetics have been removed NA Patient demonstrates correct use of air break device (if  applicable) Patient concerns have been addressed Patient grounding bracelet on and cord attached to chamber Specifics for Inpatients (complete in addition to above) Medication sheet sent with patient NA Intravenous medications needed or due during therapy sent with patient NA Drainage tubes (e.g. nasogastric tube or chest  tube secured and vented) NA Endotracheal or Tracheotomy tube secured NA Cuff deflated of air and inflated with saline NA Airway suctioned NA Notes The safety checklist was done before the treatment was started. Electronic Signature(s) Signed: 03/30/2022 3:28:49 PM By: [name] EMT Entered By: [name] on 03/30/2022 15:28:49

## 2022-03-30 NOTE — Progress Notes (Signed)
MURRELL, DOME ([id number]) Visit Report for 03/30/2022 Arrival Information Details Patient Name: Date of Service: Nathan [name] [name] 03/30/2022 1:00 PM Medical Record Number: [id number] Patient Account Number: [id number] Date of Birth/Sex: Treating RN: 23-May-1946 (76 y.o. 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 Visit Information History Since Last Visit All ordered tests and consults were completed: Yes Patient Arrived: Ambulatory Added or deleted any medications: No Arrival Time: 01:02 Any new allergies or adverse reactions: No Accompanied By: None Had a fall or experienced change in No Transfer Assistance: None activities of daily living that may affect Patient Identification Verified: Yes risk of falls: Secondary Verification Process Completed: Yes Signs or symptoms of abuse/neglect since last visito No Patient Requires Transmission-Based Precautions: No Hospitalized since last visit: No Patient Has Alerts: No Implantable device outside of the clinic excluding No cellular tissue based products placed in the center since last visit: Pain Present Now: No Electronic Signature(s) Signed: 03/30/2022 3:25:53 PM By: [name] EMT Entered By: [name] on 03/30/2022 15:25:53 -------------------------------------------------------------------------------- Encounter Discharge Information Details Patient Name: Date of Service: Nathan [name], Nathan [name] 03/30/2022 1:00 PM Medical Record Number: [id number] Patient Account Number: [id number] Date of Birth/Sex: Treating RN: [birth year] (76 y.o. 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 Encounter Discharge Information Items Discharge  Condition: Stable Ambulatory Status: Ambulatory Discharge Destination: Home Transportation: Private Auto Accompanied By: None Schedule Follow-up Appointment: Yes Clinical Summary of Care: Electronic Signature(s) Signed: 03/30/2022 3:30:47 PM By: [name] EMT Entered By: [name] on 03/30/2022 15:30:47 -------------------------------------------------------------------------------- Vitals Details Patient Name: Date of Service: Nathan [name], Nathan [name] 03/30/2022 1:00 PM Medical Record Number: [id number] Patient Account Number: [id number] Date of Birth/Sex: Treating RN: Feb 20, 1946 (76 y.o. 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

## 2022-04-02 ENCOUNTER — Encounter (HOSPITAL_BASED_OUTPATIENT_CLINIC_OR_DEPARTMENT_OTHER): Payer: Medicare Other | Attending: Internal Medicine | Admitting: Internal Medicine

## 2022-04-02 DIAGNOSIS — N3041 Irradiation cystitis with hematuria: Secondary | ICD-10-CM | POA: Diagnosis not present

## 2022-04-02 DIAGNOSIS — C61 Malignant neoplasm of prostate: Secondary | ICD-10-CM | POA: Insufficient documentation

## 2022-04-02 DIAGNOSIS — Y842 Radiological procedure and radiotherapy as the cause of abnormal reaction of the patient, or of later complication, without mention of misadventure at the time of the procedure: Secondary | ICD-10-CM | POA: Diagnosis not present

## 2022-04-02 DIAGNOSIS — I480 Paroxysmal atrial fibrillation: Secondary | ICD-10-CM | POA: Diagnosis not present

## 2022-04-02 DIAGNOSIS — I10 Essential (primary) hypertension: Secondary | ICD-10-CM | POA: Diagnosis not present

## 2022-04-02 NOTE — Progress Notes (Signed)
PIPER, ALBRO ([id number]) Visit Report for 04/02/2022 Problem List Details Patient Name: Date of Service: Nathan [name] [name] 04/02/2022 1:00 PM Medical Record Number: [id number] Patient Account Number: [id number] Date of Birth/Sex: Treating RN: 02/17/1946 (76 y.o. 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/2022 No Yes Z92.3 Personal history of irradiation 03/14/2022 No Yes C61 Malignant neoplasm of prostate 03/14/2022 No Yes I10 Essential (primary) hypertension 03/14/2022 No Yes I48.0 Paroxysmal atrial fibrillation 03/14/2022 No Yes Inactive Problems Resolved Problems Electronic Signature(s) Signed: 04/02/2022 3:44:56 PM By: [name] EMT Signed: 04/02/2022 3:55:52 PM By: [name] MD FACS Entered By: [name] on 04/02/2022 15:44:56 -------------------------------------------------------------------------------- SuperBill Details Patient Name: Date of Service: Nathan [name], Nathan [name] 04/02/2022 Medical Record Number: [id number] Patient Account Number: [id number] Date of Birth/Sex: Treating RN: [birth year] (76 y.o. 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 Diagnosis Coding ICD-10 Codes Code Description 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 Personal history of irradiation C61 Malignant neoplasm of prostate I10 Essential (primary) hypertension I48.0 Paroxysmal atrial fibrillation Facility Procedures CPT4 Code: 92446286 Description: G0277-(Facility Use Only) HBOT full body chamber, 64mn , ICD-10 Diagnosis Description N30.41  Irradiation cystitis with hematuria Z92.3 Personal history of irradiation C61 Malignant neoplasm of prostate I10 Essential (primary) hypertension Modifier: Quantity: 4 Physician Procedures : CPT4 Code Description Modifier [id number] 65790- WC PHYS HYPERBARIC OXYGEN THERAPY ICD-10 Diagnosis Description 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 Personal history of irradiation C61 Malignant neoplasm of prostate I10 Essential (primary)  hypertension Quantity: 1 Electronic Signature(s) Signed: 04/02/2022 3:44:52 PM By: GValeria BatmanEMT Signed: 04/02/2022 3:55:52 PM By: CFredirick MaudlinMD FACS Entered By: GValeria Batmanon 04/02/2022 15:44:52

## 2022-04-02 NOTE — Progress Notes (Signed)
GOVERNOR, MATOS ([id number]) Visit Report for 03/30/2022 Problem List Details Patient Name: Date of Service: Nathan [name] [name] 03/30/2022 1:00 PM Medical Record Number: [id number] Patient Account Number: [id number] Date of Birth/Sex: Treating RN: 1946/02/23 (76 y.o. 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 Active Problems ICD-10 Encounter Code Description Active Date MDM Diagnosis N30.41 Irradiation cystitis with hematuria 03/14/2022 No Yes Z92.3 Personal history of irradiation 03/14/2022 No Yes C61 Malignant neoplasm of prostate 03/14/2022 No Yes I10 Essential (primary) hypertension 03/14/2022 No Yes I48.0 Paroxysmal atrial fibrillation 03/14/2022 No Yes Inactive Problems Resolved Problems Electronic Signature(s) Signed: 03/30/2022 3:30:17 PM By: Nathan [name] EMT Signed: 04/02/2022 7:32:54 AM By: Nathan Maudlin MD FACS Entered By: Nathan [name] on 03/30/2022 15:30:17 -------------------------------------------------------------------------------- SuperBill Details Patient Name: Date of Service: Nathan [name], [name] 03/30/2022 Medical Record Number: [id number] Patient Account Number: [id number] Date of Birth/Sex: Treating RN: 08/29/1945 (76 y.o. 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 Diagnosis Coding ICD-10 Codes Code Description 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 Personal history of irradiation C61 Malignant neoplasm of prostate I10 Essential (primary) hypertension I48.0 Paroxysmal atrial fibrillation Facility Procedures CPT4 Code: [id number] Description: G0277-(Facility Use Only) HBOT full body chamber, 69mn , ICD-10 Diagnosis Description N30.41 Irradiation  cystitis with hematuria Z92.3 Personal history of irradiation C61 Malignant neoplasm of prostate Modifier: Quantity: 4 Physician Procedures : CPT4 Code Description Modifier [id number]- WC PHYS HYPERBARIC OXYGEN THERAPY ICD-10 Diagnosis Description 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 Personal history of irradiation C61 Malignant neoplasm of prostate Quantity: 1 Electronic Signature(s) Signed: 03/30/2022 3:30:07 PM By: GValeria BatmanEMT Signed: 04/02/2022 7:32:54 AM By: CFredirick MaudlinMD FACS Entered By: GValeria Batmanon 03/30/2022 15:30:07

## 2022-04-02 NOTE — Progress Notes (Signed)
TAO, SATZ ([id number]) Visit Report for 04/02/2022 Arrival Information Details Patient Name: Date of Service: Nathan [name] [name] 04/02/2022 1:00 PM Medical Record Number: [id number] Patient Account Number: [id number] Date of Birth/Sex: Treating RN: 1945-11-10 (76 y.o. 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 Visit Information History Since Last Visit All ordered tests and consults were completed: Yes Patient Arrived: Ambulatory Added or deleted any medications: No Arrival Time: 12:11 Any new allergies or adverse reactions: No Accompanied By: None Had a fall or experienced change in No Transfer Assistance: None activities of daily living that may affect Patient Identification Verified: Yes risk of falls: Secondary Verification Process Completed: Yes Signs or symptoms of abuse/neglect since last visito No Patient Requires Transmission-Based Precautions: No Hospitalized since last visit: No Patient Has Alerts: No Implantable device outside of the clinic excluding No cellular tissue based products placed in the center since last visit: Pain Present Now: No Electronic Signature(s) Signed: 04/02/2022 3:42:14 PM By: [name] EMT Entered By: [name] on 04/02/2022 15:42:14 -------------------------------------------------------------------------------- Encounter Discharge Information Details Patient Name: Date of Service: Nathan [name], Nathan [name] 04/02/2022 1:00 PM Medical Record Number: [id number] Patient Account Number: [id number] Date of Birth/Sex: Treating RN: April 14, 1946 (76 y.o. 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 Encounter Discharge Information  Items Discharge Condition: Stable Ambulatory Status: Ambulatory Discharge Destination: Home Transportation: Private Auto Accompanied By: None Schedule Follow-up Appointment: Yes Clinical Summary of Care: Electronic Signature(s) Signed: 04/02/2022 3:45:22 PM By: [name] EMT Entered By: [name] on 04/02/2022 15:45:22 -------------------------------------------------------------------------------- Vitals Details Patient Name: Date of Service: Nathan [name], Nathan [name] 04/02/2022 1:00 PM Medical Record Number: [id number] Patient Account Number: [id number] Date of Birth/Sex: Treating RN: 1946/05/19 (76 y.o. 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

## 2022-04-02 NOTE — Progress Notes (Signed)
RANBIR, CHEW ([id number]) Visit Report for 04/02/2022 HBO Details Patient Name: Date of Service: Nathan [name] [name] 04/02/2022 1:00 PM Medical Record Number: [id number] Patient Account Number: [id number] Date of Birth/Sex: Treating RN: 21-Dec-1945 (76 y.o. 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 HBO Treatment Details Treatment Number: 11 Patient Type: Outpatient Chamber Type: Monoplace Chamber Serial #: [id number] Treatment Protocol: 2.0 ATA with 90 minutes oxygen, and no air breaks Treatment Details Compression Rate Down: 2.0 psi / minute De-Compression Rate Up: 2.0 psi / minute Air breaks and breathing Decompress Decompress Compress Tx Pressure Begins Reached periods Begins Ends (leave unused spaces blank) Chamber Pressure (ATA 1 2 ------2 1 ) Clock Time (24 hr) 12:36 12:45 - - - - - - 14:15 14:22 Treatment Length: 106 (minutes) Treatment Segments: 4 Vital Signs Capillary Blood Glucose Reference Range: 80 - 120 mg / dl HBO Diabetic Blood Glucose Intervention Range: <131 mg/dl or >249 mg/dl Time Vitals Blood Respiratory Capillary Blood Glucose Pulse Action Type: Pulse: Temperature: Taken: Pressure: Rate: Glucose (mg/dl): Meter #: Oximetry (%) Taken: Pre 12:32 147/83 78 18 97.4 Post 14:23 130/85 67 16 97.3 Treatment Response Treatment Toleration: Well Treatment Completion Status: Treatment Completed without Adverse Event Physician HBO Attestation: I certify that I supervised this HBO treatment in accordance with Medicare guidelines. A trained emergency response team is readily available per  Yes hospital policies and procedures. Continue HBOT as ordered. Yes Electronic Signature(s) Signed: 04/02/2022 3:58:50 PM By: Nathan Maudlin MD FACS Previous Signature: 04/02/2022 3:44:33 PM Version By: Nathan [name] EMT Entered By: Nathan [name] on 04/02/2022 15:58:50 -------------------------------------------------------------------------------- HBO Safety Checklist Details Patient Name: Date of Service: Nathan [name] 04/02/2022 1:00 PM Medical Record Number: [id number] Patient Account Number: [id number] Date of Birth/Sex: Treating RN: Nov 11, 1945 (76 y.o. Nathan [name] Primary Care Nathan [name]: Nathan [name] Other Clinician: Valeria [name] Referring Nathan [name]: Treating Nathan [name]/Extender: Nathan [name] in Treatment: 2 HBO Safety Checklist Items Safety Checklist Consent Form Signed Patient voided / foley secured and emptied When did you last eato 1030 Last dose of injectable or oral agent NA Ostomy pouch emptied and vented if applicable NA All implantable devices assessed, documented and approved foley cath to leg bag Intravenous access site secured and place NA Valuables secured Linens and cotton and cotton/polyester blend (less than 51% polyester) Personal oil-based products / skin lotions / body lotions removed Wigs or hairpieces removed NA Smoking or tobacco materials removed Books / newspapers / magazines / loose paper removed Cologne, aftershave, perfume and deodorant removed Jewelry removed (may wrap wedding band) NA Make-up removed NA Hair care products removed Battery operated devices (external) removed Heating patches and chemical warmers removed Titanium eyewear removed NA Nail polish cured greater than 10 hours NA Casting material cured greater than 10 hours NA Hearing aids removed NA Loose dentures or partials removed NA Prosthetics have been removed NA Patient demonstrates correct use of air break device  (if applicable) Patient concerns have been addressed Patient grounding bracelet on and cord attached to chamber Specifics for Inpatients (complete in addition to above) Medication sheet sent with patient NA Intravenous medications needed or due during therapy sent with patient NA Drainage tubes (e.g. nasogastric tube or chest tube secured  and vented) NA Endotracheal or Tracheotomy tube secured NA Cuff deflated of air and inflated with saline NA Airway suctioned NA Notes The safety checklist was done before the treatment was started. Electronic Signature(s) Signed: 04/02/2022 3:43:40 PM By: Nathan [name] EMT Entered By: Nathan [name] on 04/02/2022 15:43:39

## 2022-04-03 ENCOUNTER — Encounter (HOSPITAL_BASED_OUTPATIENT_CLINIC_OR_DEPARTMENT_OTHER): Payer: Medicare Other | Admitting: Internal Medicine

## 2022-04-03 DIAGNOSIS — N3041 Irradiation cystitis with hematuria: Secondary | ICD-10-CM | POA: Diagnosis not present

## 2022-04-03 DIAGNOSIS — C61 Malignant neoplasm of prostate: Secondary | ICD-10-CM

## 2022-04-03 DIAGNOSIS — Z923 Personal history of irradiation: Secondary | ICD-10-CM

## 2022-04-03 NOTE — Progress Notes (Signed)
Nathan [name] ([id number]) Visit Report for 04/03/2022 HBO Safety Checklist Details Patient Name: Date of Service: Nathan [name] [name] 04/03/2022 1:00 PM Medical Record Number: [id number] Patient Account Number: [id number] Date of Birth/Sex: Treating RN: 1946-05-26 (76 y.o. Mare Ferrari Primary Care Malissia Rabbani: [name] Other Clinician: [name] Referring Achol Azpeitia: Treating Darriona Dehaas/Extender: [name] in Treatment: 2 HBO Safety Checklist Items Safety Checklist Consent Form Signed Patient voided / foley secured and emptied When did you last eato 1030 Last dose of injectable or oral agent n/a Ostomy pouch emptied and vented if applicable NA All implantable devices assessed, documented and approved NA Intravenous access site secured and place NA Valuables secured Linens and cotton and cotton/polyester blend (less than 51% polyester) Personal oil-based products / skin lotions / body lotions removed Wigs or hairpieces removed NA Smoking or tobacco materials removed NA Books / newspapers / magazines / loose paper removed Cologne, aftershave, perfume and deodorant removed Jewelry removed (may wrap wedding band) Make-up removed Hair care products removed Battery operated devices (external) removed Heating patches and chemical warmers removed Titanium eyewear removed NA Nail polish cured greater than 10 hours NA Casting material cured greater than 10 hours NA Hearing aids removed NA Loose dentures or partials removed dentures removed Prosthetics have been removed NA Patient demonstrates correct use of air break device (if applicable) Patient concerns have been addressed Patient grounding bracelet on and cord attached to chamber Specifics for Inpatients (complete in addition to above) Medication sheet sent with patient NA Intravenous medications needed or due during therapy sent with patient NA Drainage tubes (e.g.  nasogastric tube or chest tube secured and vented) NA Endotracheal or Tracheotomy tube secured NA Cuff deflated of air and inflated with saline NA Airway suctioned NA Notes Paper version used prior to treatment start. Electronic Signature(s) Signed: 04/03/2022 2:28:13 PM By: [name] CHT EMT BS , , Entered By: [name] on 04/03/2022 14:28:13

## 2022-04-03 NOTE — Progress Notes (Addendum)
WISSAM, RESOR ([id number]) Visit Report for 04/03/2022 Arrival Information Details Patient Name: Date of Service: Nathan [name] [name] 04/03/2022 1:00 PM Medical Record Number: [id number] Patient Account Number: [id number] Date of Birth/Sex: Treating RN: [birth year] (76 y.o. Mare Ferrari Primary Care Jeanluc Wegman: [name] Other Clinician: [name] Referring Emmilynn Marut: Treating Lilee Aldea/Extender: [name] in Treatment: 2 Visit Information History Since Last Visit All ordered tests and consults were completed: Yes Patient Arrived: Ambulatory Added or deleted any medications: No Arrival Time: 12:27 Any new allergies or adverse reactions: No Accompanied By: self Had a fall or experienced change in No Transfer Assistance: None activities of daily living that may affect Patient Identification Verified: Yes risk of falls: Secondary Verification Process Completed: Yes Signs or symptoms of abuse/neglect since last visito No Patient Requires Transmission-Based Precautions: No Hospitalized since last visit: No Patient Has Alerts: No Implantable device outside of the clinic excluding No cellular tissue based products placed in the center since last visit: Pain Present Now: No Electronic Signature(s) Signed: 04/03/2022 2:26:41 PM By: [name] CHT EMT BS , , Entered By: [name] on 04/03/2022 14:26:40 -------------------------------------------------------------------------------- Encounter Discharge Information Details Patient Name: Date of Service: Nathan [name], [name] 04/03/2022 1:00 PM Medical Record Number: [id number] Patient Account Number: [id number] Date of Birth/Sex: Treating RN: [birth year] (76 y.o. Mare Ferrari Primary Care Birney Belshe: [name] Other Clinician: [name] Referring Nyima Vanacker: Treating Carolie Mcilrath/Extender: [name] in Treatment: 2 Encounter Discharge  Information Items Discharge Condition: Stable Ambulatory Status: Ambulatory Discharge Destination: Home Transportation: Private Auto Accompanied By: self Schedule Follow-up Appointment: No Clinical Summary of Care: Electronic Signature(s) Signed: 04/03/2022 2:56:42 PM By: [name] CHT EMT BS , , Entered By: [name] on 04/03/2022 14:56:42 -------------------------------------------------------------------------------- [place] Details Patient Name: Date of Service: Nathan [name], [name] 04/03/2022 1:00 PM Medical Record Number: [id number] Patient Account Number: [id number] Date of Birth/Sex: Treating RN: 09-02-1945 (76 y.o. Mare Ferrari Primary Care Evaluna Utke: [name] Other Clinician: [name] Referring Kelten Enochs: Treating Dalma Panchal/Extender: [name] in Treatment: 2 Vital Signs Time Taken: 12:37 Temperature (F): 97.3 Height (in): 73 Pulse (bpm): 89 Weight (lbs): 183 Respiratory Rate (breaths/min): 16 Body Mass Index (BMI): 24.1 Blood Pressure (mmHg): 150/85 Reference Range: 80 - 120 mg / dl Electronic Signature(s) Signed: 04/03/2022 2:27:08 PM By: [name] CHT EMT BS , , Entered By: [name] on 04/03/2022 14:27:07

## 2022-04-04 ENCOUNTER — Encounter (HOSPITAL_BASED_OUTPATIENT_CLINIC_OR_DEPARTMENT_OTHER): Payer: Medicare Other | Admitting: General Surgery

## 2022-04-04 DIAGNOSIS — N3041 Irradiation cystitis with hematuria: Secondary | ICD-10-CM | POA: Diagnosis not present

## 2022-04-04 NOTE — Progress Notes (Signed)
TREMONT, GAVITT ([id number]) Visit Report for 04/04/2022 Arrival Information Details Patient Name: Date of Service: Nathan [name] [name] 04/04/2022 1:00 PM Medical Record Number: [id number] Patient Account Number: [id number] Date of Birth/Sex: Treating RN: [birth year] (76 y.o. [name], [name] Primary Care Lissandro Dilorenzo: [name] Other Clinician: [name] Referring Loriene Taunton: Treating Sobia Karger/Extender: [name] in Treatment: 3 Visit Information History Since Last Visit All ordered tests and consults were completed: Yes Patient Arrived: [name] Added or deleted any medications: No Arrival Time: 12:23 Any new allergies or adverse reactions: No Accompanied By: None Had a fall or experienced change in No Transfer Assistance: None activities of daily living that may affect Patient Identification Verified: Yes risk of falls: Secondary Verification Process Completed: Yes Signs or symptoms of abuse/neglect since last visito No Patient Requires Transmission-Based Precautions: No Hospitalized since last visit: No Patient Has Alerts: No Implantable device outside of the clinic excluding No cellular tissue based products placed in the center since last visit: Pain Present Now: No Electronic Signature(s) Signed: 04/04/2022 3:47:23 PM By: [name] EMT Previous Signature: 04/04/2022 3:43:23 PM Version By: [name] EMT Entered By: [name] on 04/04/2022 15:47:23 -------------------------------------------------------------------------------- Encounter Discharge Information Details Patient Name: Date of Service: Nathan [name], Nathan [name] 04/04/2022 1:00 PM Medical Record Number: [id number] Patient Account Number: [id number] Date of Birth/Sex: Treating RN: 06/16/1946 (76 y.o. [name] Primary Care Delshon Blanchfield: [name] Other Clinician: [name] Referring Sanaii Caporaso: Treating Makaleigh Reinard/Extender: [name] in  Treatment: 3 Encounter Discharge Information Items Discharge Condition: Stable Ambulatory Status: Ambulatory Discharge Destination: Home Transportation: Private Auto Accompanied By: None Schedule Follow-up Appointment: Yes Clinical Summary of Care: Electronic Signature(s) Signed: 04/04/2022 3:56:56 PM By: [name] EMT Entered By: [name] on 04/04/2022 15:56:56 -------------------------------------------------------------------------------- [place] Details Patient Name: Date of Service: Nathan [name], Nathan [name] 04/04/2022 1:00 PM Medical Record Number: [id number] Patient Account Number: [id number] Date of Birth/Sex: Treating RN: 1946-03-22 (76 y.o. [name], [name] Primary Care Orry Sigl: [name] Other Clinician: [name] Referring Dreshawn Hendershott: Treating Davionne Mastrangelo/Extender: [name] in Treatment: 3 Vital Signs Time Taken: 12:40 Temperature (F): 97.9 Height (in): 73 Pulse (bpm): 82 Weight (lbs): 183 Respiratory Rate (breaths/min): 16 Body Mass Index (BMI): 24.1 Blood Pressure (mmHg): 156/82 Reference Range: 80 - 120 mg / dl Electronic Signature(s) Signed: 04/04/2022 3:45:28 PM By: [name] EMT Entered By: [name] on 04/04/2022 15:45:28

## 2022-04-04 NOTE — Progress Notes (Addendum)
MIHAIL, PRETTYMAN ([id number]) Visit Report for 04/04/2022 HBO Details Patient Name: Date of Service: Nathan [name] [name] 04/04/2022 1:00 PM Medical Record Number: [id number] Patient Account Number: [id number] Date of Birth/Sex: Treating RN: Dec 25, 1945 (76 y.o. 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 HBO Treatment Details Treatment Number: 13 Patient Type: Outpatient Chamber Type: Monoplace Chamber Serial #: [id number] Treatment Protocol: 2.0 ATA with 90 minutes oxygen, and no air breaks Treatment Details Compression Rate Down: 2.0 psi / minute De-Compression Rate Up: 2.0 psi / minute Air breaks and breathing Decompress Decompress Compress Tx Pressure Begins Reached periods Begins Ends (leave unused spaces blank) Chamber Pressure (ATA 1 2 ------2 1 ) Clock Time (24 hr) 12:43 12:51 - - - - - - 14:21 14:29 Treatment Length: 106 (minutes) Treatment Segments: 4 Vital Signs Capillary Blood Glucose Reference Range: 80 - 120 mg / dl HBO Diabetic Blood Glucose Intervention Range: <131 mg/dl or >249 mg/dl Time Vitals Blood Respiratory Capillary Blood Glucose Pulse Action Type: Pulse: Temperature: Taken: Pressure: Rate: Glucose (mg/dl): Meter #: Oximetry (%) Taken: Pre 12:40 156/82 82 16 97.9 Post 14:31 141/79 62 18 97.8 Treatment Response Treatment Toleration: Well Treatment Completion Status: Treatment Completed without Adverse Event Physician HBO Attestation: I certify that I supervised this HBO treatment in accordance with Medicare guidelines. A trained emergency response team is readily available per Yes hospital  policies and procedures. Continue HBOT as ordered. Yes Electronic Signature(s) Signed: 04/04/2022 5:01:05 PM By: [name] MD FACS Previous Signature: 04/04/2022 3:55:59 PM Version By: [name] EMT Entered By: [name] on 04/04/2022 17:01:05 -------------------------------------------------------------------------------- HBO Safety Checklist Details Patient Name: Date of Service: Nathan [name], [name] 04/04/2022 1:00 PM Medical Record Number: [id number] Patient Account Number: [id number] Date of Birth/Sex: Treating RN: 07/21/1945 (76 y.o. Nathan [name], [name] Primary Care [name] [name]: [name] Other Clinician: [name] Referring Kaina Orengo: Treating Greer Koeppen/Extender: [name] in Treatment: 3 HBO Safety Checklist Items Safety Checklist Consent Form Signed Patient voided / foley secured and emptied When did you last eato 0930 Last dose of injectable or oral agent NA Ostomy pouch emptied and vented if applicable NA All implantable devices assessed, documented and approved foley cath to leg bag Intravenous access site secured and place NA Valuables secured Linens and cotton and cotton/polyester blend (less than 51% polyester) Personal oil-based products / skin lotions / body lotions removed Wigs or hairpieces removed NA Smoking or tobacco materials removed Books / newspapers / magazines / loose paper removed Cologne, aftershave, perfume and deodorant removed Jewelry removed (may wrap wedding band) NA Make-up removed NA Hair care products removed Battery operated devices (external) removed Heating patches and chemical warmers removed Titanium eyewear removed NA Nail polish cured greater than 10 hours NA Casting material cured greater than 10 hours NA Hearing aids removed NA Loose dentures or partials removed NA Prosthetics have been removed NA Patient demonstrates correct use of air break device (if  applicable) Patient concerns have been addressed Patient grounding bracelet on and cord attached to chamber Specifics for Inpatients (complete in addition to above) Medication sheet sent with patient NA Intravenous medications needed or due during therapy sent with patient NA Drainage tubes (e.g. nasogastric tube or chest tube secured  and vented) NA Endotracheal or Tracheotomy tube secured NA Cuff deflated of air and inflated with saline NA Airway suctioned NA Notes The safety checklist was done before the treatment was started. Electronic Signature(s) Signed: 04/04/2022 3:47:09 PM By: [name] EMT Entered By: [name] on 04/04/2022 15:47:09

## 2022-04-04 NOTE — Progress Notes (Signed)
[name], [name] ([id number]) Visit Report for 04/04/2022 Problem List Details Patient Name: Date of Service: Nathan [name] [name] 04/04/2022 1:00 PM Medical Record Number: [id number] Patient Account Number: [id number] Date of Birth/Sex: Treating RN: 1945/10/02 (76 y.o. 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 No Yes Z92.3 Personal history of irradiation 03/14/2022 No Yes C61 Malignant neoplasm of prostate 03/14/2022 No Yes I10 Essential (primary) hypertension 03/14/2022 No Yes I48.0 Paroxysmal atrial fibrillation 03/14/2022 No Yes Inactive Problems Resolved Problems Electronic Signature(s) Signed: 04/04/2022 3:56:30 PM By: [name] EMT Signed: 04/04/2022 4:56:11 PM By: [name] MD FACS Entered By: [name] on 04/04/2022 15:56:30 -------------------------------------------------------------------------------- [place] Details Patient Name: Date of Service: [name], [name] 04/04/2022 Medical Record Number: [id number] Patient Account Number: [id number] Date of Birth/Sex: Treating RN: 03-02-1946 (76 y.o. 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 I10 Essential (primary) hypertension I48.0 Paroxysmal atrial fibrillation Facility Procedures CPT4 Code: [id number] Description: G0277-(Facility Use Only) HBOT full body chamber, 76mn , ICD-10 Diagnosis Description N30.41 Irradiation  cystitis with hematuria Z92.3 Personal history of irradiation C61 Malignant neoplasm of prostate I10 Essential (primary) hypertension Modifier: Quantity: 4 Physician Procedures : CPT4 Code Description Modifier [id number]- WC PHYS HYPERBARIC OXYGEN THERAPY ICD-10 Diagnosis Description 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

## 2022-04-04 NOTE — Progress Notes (Signed)
COLEN, ELTZROTH ([id number]) Visit Report for 04/03/2022 [place] Details Patient Name: Date of Service: Nathan [name] [name] 04/03/2022 Medical Record Number: [id number] Patient Account Number: [id number] Date of Birth/Sex: Treating RN: 1946/02/27 (76 y.o. 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

## 2022-04-05 ENCOUNTER — Encounter (HOSPITAL_BASED_OUTPATIENT_CLINIC_OR_DEPARTMENT_OTHER): Payer: Medicare Other | Admitting: General Surgery

## 2022-04-05 DIAGNOSIS — N3041 Irradiation cystitis with hematuria: Secondary | ICD-10-CM | POA: Diagnosis not present

## 2022-04-05 NOTE — Progress Notes (Signed)
Nathan, [name] ([id number]) Visit Report for 04/05/2022 HBO Details Patient Name: Date of Service: Nathan [name] [name] 04/05/2022 1:00 PM Medical Record Number: [id number] Patient Account Number: [id number] Date of Birth/Sex: Treating RN: 1945-11-08 (76 y.o. 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 HBO Treatment Details Treatment Number: 14 Patient Type: Outpatient Chamber Type: Monoplace Chamber Serial #: [id number] Treatment Protocol: 2.0 ATA with 90 minutes oxygen, and no air breaks Treatment Details Compression Rate Down: 1.5 psi / minute De-Compression Rate Up: 2.0 psi / minute Air breaks and breathing Decompress Decompress Compress Tx Pressure Begins Reached periods Begins Ends (leave unused spaces blank) Chamber Pressure (ATA 1 2 ------2 1 ) Clock Time (24 hr) 12:41 12:52 - - - - - - 14:22 14:30 Treatment Length: 109 (minutes) Treatment Segments: 4 Vital Signs Capillary Blood Glucose Reference Range: 80 - 120 mg / dl HBO Diabetic Blood Glucose Intervention Range: <131 mg/dl or >249 mg/dl Type: Time Vitals Blood Respiratory Capillary Blood Glucose Pulse Action Pulse: Temperature: Taken: Pressure: Rate: Glucose (mg/dl): Meter #: Oximetry (%) Taken: Pre 12:37 144/90 81 20 97.8 none per protocol Post 14:36 148/70 68 18 97.4 none per protocol Treatment Response Treatment Toleration: Well Treatment Completion Status: Treatment Completed without Adverse Event Physician HBO Attestation: I certify that I supervised this HBO treatment in accordance with Medicare guidelines. A trained emergency response team  is readily available per Yes hospital policies and procedures. Continue HBOT as ordered. Yes Electronic Signature(s) Signed: 04/05/2022 4:17:46 PM By: Nathan Maudlin MD FACS Previous Signature: 04/05/2022 3:10:39 PM Version By: Nathan [name] CHT EMT BS , , Entered By: Nathan [name] on 04/05/2022 16:17:46 -------------------------------------------------------------------------------- HBO Safety Checklist Details Patient Name: Date of Service: Nathan [name], [name] 04/05/2022 1:00 PM Medical Record Number: [id number] Patient Account Number: [id number] Date of Birth/Sex: Treating RN: 1945-09-29 (76 y.o. Nathan [name] Session Primary Care Nathan [name]: Nathan [name] Other Clinician: [name] Referring Nathan [name]: Treating Nathan [name]/Extender: Nathan [name] in Treatment: 3 HBO Safety Checklist Items Safety Checklist Consent Form Signed Patient voided / foley secured and emptied When did you last eato 1000 Last dose of injectable or oral agent n/a Ostomy pouch emptied and vented if applicable NA All implantable devices assessed, documented and approved NA Intravenous access site secured and place NA Valuables secured Linens and cotton and cotton/polyester blend (less than 51% polyester) Personal oil-based products / skin lotions / body lotions removed Wigs or hairpieces removed NA Smoking or tobacco materials removed NA Books / newspapers / magazines / loose paper removed Cologne, aftershave, perfume and deodorant removed Jewelry removed (may wrap wedding band) Make-up removed NA Hair care products removed Battery operated devices (external) removed Heating patches and chemical warmers removed Titanium eyewear removed NA Nail polish cured greater than 10 hours NA Casting material cured greater than 10 hours NA Hearing aids removed NA Loose dentures or partials removed dentures removed Prosthetics have been removed NA Patient demonstrates  correct use of air break device (if applicable) Patient concerns have been addressed Patient grounding bracelet on and cord attached to chamber Specifics for Inpatients (complete in addition to above) Medication sheet sent with patient NA Intravenous medications needed or due during therapy sent with patient NA Drainage tubes (  e.g. nasogastric tube or chest tube secured and vented) NA Endotracheal or Tracheotomy tube secured NA Cuff deflated of air and inflated with saline NA Airway suctioned NA Notes Paper version used prior to treatment start. Electronic Signature(s) Signed: 04/05/2022 2:03:34 PM By: Nathan [name] CHT EMT BS , , Entered By: Nathan [name] on 04/05/2022 13:41:49

## 2022-04-05 NOTE — Progress Notes (Signed)
Nathan [name], Nathan [name] ([id number]) Visit Report for 04/05/2022 Arrival Information Details Patient Name: Date of Service: Nathan [name] Nathan [name] 04/05/2022 1:00 PM Medical Record Number: [id number] Patient Account Number: [id number] Date of Birth/Sex: Treating RN: 04-Apr-1946 (76 y.o. [name] Primary Care Keigan Tafoya: [name] Other Clinician: [name] Referring Daelon Dunivan: Treating Brazen Domangue/Extender: [name] in Treatment: 3 Visit Information History Since Last Visit All ordered tests and consults were completed: Yes Patient Arrived: Ambulatory Added or deleted any medications: No Arrival Time: 12:25 Any new allergies or adverse reactions: No Accompanied By: self Had a fall or experienced change in No Transfer Assistance: None activities of daily living that may affect Patient Identification Verified: Yes risk of falls: Secondary Verification Process Completed: Yes Signs or symptoms of abuse/neglect since last visito No Patient Requires Transmission-Based Precautions: No Hospitalized since last visit: No Patient Has Alerts: No Implantable device outside of the clinic excluding No cellular tissue based products placed in the center since last visit: Pain Present Now: No Electronic Signature(s) Signed: 04/05/2022 2:03:34 PM By: [name] CHT EMT BS , , Entered By: [name] on 04/05/2022 13:40:16 -------------------------------------------------------------------------------- Encounter Discharge Information Details Patient Name: Date of Service: Nathan [name], Nathan [name] 04/05/2022 1:00 PM Medical Record Number: [id number] Patient Account Number: [id number] Date of Birth/Sex: Treating RN: 08-Jan-1946 (76 y.o. [name] Primary Care Rhylei Mcquaig: [name] Other Clinician: [name] Referring Keegen Heffern: Treating Brynden Thune/Extender: [name] in Treatment: 3 Encounter Discharge Information  Items Discharge Condition: Stable Ambulatory Status: Ambulatory Discharge Destination: Home Transportation: Private Auto Accompanied By: self Schedule Follow-up Appointment: No Clinical Summary of Care: Electronic Signature(s) Signed: 04/05/2022 3:10:39 PM By: [name] CHT EMT BS , , Entered By: [name] on 04/05/2022 15:10:15 -------------------------------------------------------------------------------- Vitals Details Patient Name: Date of Service: Nathan [name], Nathan [name] 04/05/2022 1:00 PM Medical Record Number: [id number] Patient Account Number: [id number] Date of Birth/Sex: Treating RN: 1946-06-20 (76 y.o. [name] Primary Care Freeman Borba: [name] Other Clinician: [name] Referring Jontae Adebayo: Treating Arlys Scatena/Extender: [name] in Treatment: 3 Vital Signs Time Taken: 12:37 Temperature (F): 97.8 Height (in): 73 Pulse (bpm): 81 Weight (lbs): 183 Respiratory Rate (breaths/min): 20 Body Mass Index (BMI): 24.1 Blood Pressure (mmHg): 144/90 Reference Range: 80 - 120 mg / dl Electronic Signature(s) Signed: 04/05/2022 2:03:34 PM By: [name] CHT EMT BS , , Entered By: [name] on 04/05/2022 13:40:41

## 2022-04-05 NOTE — Progress Notes (Signed)
LANDO, ALCALDE ([id number]) Visit Report for 04/05/2022 SuperBill Details Patient Name: Date of Service: Nathan [name] [name] 04/05/2022 Medical Record Number: [id number] Patient Account Number: [id number] Date of Birth/Sex: Treating RN: 03-Apr-1946 (76 y.o. 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

## 2022-04-06 ENCOUNTER — Encounter (HOSPITAL_BASED_OUTPATIENT_CLINIC_OR_DEPARTMENT_OTHER): Payer: Medicare Other | Admitting: General Surgery

## 2022-04-06 DIAGNOSIS — N3041 Irradiation cystitis with hematuria: Secondary | ICD-10-CM | POA: Diagnosis not present

## 2022-04-06 NOTE — Progress Notes (Addendum)
JAWAUN, CELMER ([id number]) Visit Report for 04/06/2022 Arrival Information Details Patient Name: Date of Service: Nathan [name] [name] 04/06/2022 1:00 PM Medical Record Number: [id number] Patient Account Number: [id number] Date of Birth/Sex: Treating RN: 1945-08-25 (76 y.o. [name], [name] Primary Care Safira Proffit: [name] Other Clinician: [name] Referring Tavon Magnussen: Treating Kelven Flater/Extender: [name] in Treatment: 3 Visit Information History Since Last Visit All ordered tests and consults were completed: Yes Patient Arrived: Ambulatory Added or deleted any medications: No Arrival Time: 12:17 Any new allergies or adverse reactions: No Accompanied By: self Had a fall or experienced change in No Transfer Assistance: None activities of daily living that may affect Patient Identification Verified: Yes risk of falls: Secondary Verification Process Completed: Yes Signs or symptoms of abuse/neglect since last visito No Patient Requires Transmission-Based Precautions: No Hospitalized since last visit: No Patient Has Alerts: No Implantable device outside of the clinic excluding No cellular tissue based products placed in the center since last visit: Pain Present Now: No Electronic Signature(s) Signed: 04/06/2022 3:32:54 PM By: [name] CHT EMT BS , , Entered By: [name] on 04/06/2022 15:32:54 -------------------------------------------------------------------------------- Encounter Discharge Information Details Patient Name: Date of Service: Nathan [name], [name] 04/06/2022 1:00 PM Medical Record Number: [id number] Patient Account Number: [id number] Date of Birth/Sex: Treating RN: 1946/03/18 (76 y.o. [name] Primary Care Ples Trudel: [name] Other Clinician: [name] Referring Azarias Chiou: Treating Maximilian Tallo/Extender: [name] in Treatment: 3 Encounter Discharge  Information Items Discharge Condition: Stable Ambulatory Status: Ambulatory Discharge Destination: Home Transportation: Private Auto Accompanied By: self Schedule Follow-up Appointment: No Clinical Summary of Care: Electronic Signature(s) Signed: 04/06/2022 5:59:00 PM By: [name] CHT EMT BS , , Entered By: [name] on 04/06/2022 17:59:00 -------------------------------------------------------------------------------- Vitals Details Patient Name: Date of Service: Nathan [name], [name] 04/06/2022 1:00 PM Medical Record Number: [id number] Patient Account Number: [id number] Date of Birth/Sex: Treating RN: 1945/09/29 (76 y.o. [name], [name] Primary Care Bolton Canupp: [name] Other Clinician: [name] Referring Jaksen Fiorella: Treating Felis Quillin/Extender: [name] in Treatment: 3 Vital Signs Time Taken: 12:25 Temperature (F): 97.5 Height (in): 73 Pulse (bpm): 91 Weight (lbs): 183 Respiratory Rate (breaths/min): 16 Body Mass Index (BMI): 24.1 Blood Pressure (mmHg): 169/89 Reference Range: 80 - 120 mg / dl Electronic Signature(s) Signed: 04/06/2022 3:33:22 PM By: [name] CHT EMT BS , , Entered By: [name] on 04/06/2022 15:33:22

## 2022-04-06 NOTE — Progress Notes (Addendum)
SYLVAIN, HASTEN ([id number]) Visit Report for 04/06/2022 HBO Details Patient Name: Date of Service: Nathan [name] [name] 04/06/2022 1:00 PM Medical Record Number: [id number] Patient Account Number: [id number] Date of Birth/Sex: Treating RN: [birth year] (76 y.o. 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 HBO Treatment Details Treatment Number: 15 Patient Type: Outpatient Chamber Type: Monoplace Chamber Serial #: [id number] Treatment Protocol: 2.0 ATA with 90 minutes oxygen, and no air breaks Treatment Details Compression Rate Down: 2.0 psi / minute De-Compression Rate Up: 2.0 psi / minute Air breaks and breathing Decompress Decompress Compress Tx Pressure Begins Reached periods Begins Ends (leave unused spaces blank) Chamber Pressure (ATA 1 2 ------2 1 ) Clock Time (24 hr) 12:30 12:38 - - - - - - 14:08 14:16 Treatment Length: 106 (minutes) Treatment Segments: 4 Vital Signs Capillary Blood Glucose Reference Range: 80 - 120 mg / dl HBO Diabetic Blood Glucose Intervention Range: <131 mg/dl or >249 mg/dl Type: Time Vitals Blood Respiratory Capillary Blood Glucose Pulse Action Pulse: Temperature: Taken: Pressure: Rate: Glucose (mg/dl): Meter #: Oximetry (%) Taken: Pre 12:25 169/89 91 16 97.5 none per protocol Post 14:19 150/80 70 18 97.3 none per protocol Treatment Response Treatment Toleration: Well Treatment Completion Status: Treatment Completed without Adverse Event Physician HBO Attestation: I certify that I supervised this HBO treatment in accordance with Medicare guidelines. A trained emergency response team  is readily available per Yes hospital policies and procedures. Continue HBOT as ordered. Yes Electronic Signature(s) Signed: 04/06/2022 4:43:19 PM By: [name] MD FACS Previous Signature: 04/06/2022 3:38:34 PM Version By: [name] CHT EMT BS , , Entered By: [name] on 04/06/2022 16:43:19 -------------------------------------------------------------------------------- HBO Safety Checklist Details Patient Name: Date of Service: Nathan [name], [name] 04/06/2022 1:00 PM Medical Record Number: [id number] Patient Account Number: [id number] Date of Birth/Sex: Treating RN: 06-07-1946 (76 y.o. [name], [name] Primary Care Elysia Grand: [name] Other Clinician: [name] Referring Hughey Rittenberry: Treating Talicia Sui/Extender: [name] in Treatment: 3 HBO Safety Checklist Items Safety Checklist Consent Form Signed Patient voided / foley secured and emptied When did you last eato 1000 Last dose of injectable or oral agent n/a Ostomy pouch emptied and vented if applicable NA All implantable devices assessed, documented and approved NA Intravenous access site secured and place NA Valuables secured Linens and cotton and cotton/polyester blend (less than 51% polyester) Personal oil-based products / skin lotions / body lotions removed Wigs or hairpieces removed NA Smoking or tobacco materials removed NA Books / newspapers / magazines / loose paper removed Cologne, aftershave, perfume and deodorant removed Jewelry removed (may wrap wedding band) Make-up removed NA Hair care products removed Battery operated devices (external) removed Heating patches and chemical warmers removed Titanium eyewear removed Nail polish cured greater than 10 hours NA Casting material cured greater than 10 hours NA Hearing aids removed NA Loose dentures or partials removed Prosthetics have been removed NA Patient demonstrates correct use of air  break device (if applicable) Patient concerns have been addressed Patient grounding bracelet on and cord attached to chamber Specifics for Inpatients (complete in addition to above) Medication sheet sent with patient NA Intravenous medications needed or due during therapy sent with patient NA Drainage tubes (e.g. nasogastric tube  or chest tube secured and vented) NA Endotracheal or Tracheotomy tube secured NA Cuff deflated of air and inflated with saline NA Airway suctioned NA Notes Paper version used prior to treatment start. Electronic Signature(s) Signed: 04/06/2022 3:37:45 PM By: [name] CHT EMT BS , , Entered By: [name] on 04/06/2022 15:37:45

## 2022-04-06 NOTE — Progress Notes (Signed)
[name], [name] ([id number]) Visit Report for 04/06/2022 SuperBill Details Patient Name: Date of Service: Nathan [name] [name] 04/06/2022 Medical Record Number: [id number] Patient Account Number: [id number] Date of Birth/Sex: Treating RN: 04/03/1946 (76 y.o. 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

## 2022-04-09 ENCOUNTER — Encounter (HOSPITAL_BASED_OUTPATIENT_CLINIC_OR_DEPARTMENT_OTHER): Payer: Medicare Other | Admitting: Internal Medicine

## 2022-04-09 DIAGNOSIS — I10 Essential (primary) hypertension: Secondary | ICD-10-CM

## 2022-04-09 DIAGNOSIS — N3041 Irradiation cystitis with hematuria: Secondary | ICD-10-CM | POA: Diagnosis not present

## 2022-04-09 DIAGNOSIS — C61 Malignant neoplasm of prostate: Secondary | ICD-10-CM | POA: Diagnosis not present

## 2022-04-09 DIAGNOSIS — Z923 Personal history of irradiation: Secondary | ICD-10-CM

## 2022-04-09 NOTE — Progress Notes (Signed)
KEANTE, URIZAR ([id number]) [id number] Page 1 of 2 Visit Report for 04/09/2022 Arrival Information Details Patient Name: Date of Service: Nathan [name] Nathan [name] 04/09/2022 1:00 PM Medical Record Number: [id number] Patient Account Number: [id number] Date of Birth/Sex: Treating RN: 12-15-1945 (76 y.o. Nathan [name], Nathan [name] Primary Care Nathan [name]: Nathan [name] Other Clinician: [name] Referring Nathan [name]: Treating Nathan [name]/Extender: Nathan [name] in Treatment: 3 Visit Information History Since Last Visit All ordered tests and consults were completed: Yes Patient Arrived: Nathan [name] Added or deleted any medications: No Arrival Time: 12:20 Any new allergies or adverse reactions: No Accompanied By: self Had a fall or experienced change in No Transfer Assistance: None activities of daily living that may affect Patient Identification Verified: Yes risk of falls: Secondary Verification Process Completed: Yes Signs or symptoms of abuse/neglect since last visito No Patient Requires Transmission-Based Precautions: No Hospitalized since last visit: No Patient Has Alerts: No Implantable device outside of the clinic excluding No cellular tissue based products placed in the center since last visit: Pain Present Now: No Electronic Signature(s) Signed: 04/09/2022 4:26:39 PM By: Nathan [name] CHT EMT BS , , Previous Signature: 04/09/2022 4:19:29 PM Version By: Nathan [name] CHT EMT BS , , Entered By: Nathan [name] on 04/09/2022 16:26:39 -------------------------------------------------------------------------------- Encounter Discharge Information Details Patient Name: Date of Service: Nathan [name], [name] Nathan [name] 04/09/2022 1:00 PM Medical Record Number: [id number] Patient Account Number: [id number] Date of Birth/Sex: Treating RN: [birth year] (76 y.o. Nathan [name] Primary Care Nathan [name]: Nathan [name] Other Clinician: [name] Referring Nathan [name]: Treating Nathan [name]/Extender: Nathan [name] in Treatment: 3 Encounter Discharge Information Items Discharge Condition: Stable Ambulatory Status: Cane Discharge Destination: Home Transportation: Private Auto Accompanied By: self Schedule Follow-up Appointment: No Clinical Summary of Care: Electronic Signature(s) Signed: 04/09/2022 4:26:57 PM By: Nathan [name] CHT EMT BS , , Previous Signature: 04/09/2022 4:18:37 PM Version By: Nathan [name] CHT EMT BS , , Entered By: Nathan [name] on 04/09/2022 16:26:57 Nathan [name] ([id number]) [id number] Page 2 of 2 -------------------------------------------------------------------------------- Vitals Details Patient Name: Date of Service: Nathan [name] Nathan [name] 04/09/2022 1:00 PM Medical Record Number: [id number] Patient Account Number: [id number] Date of Birth/Sex: Treating RN: 1946-02-04 (76 y.o. Nathan [name], Nathan [name] Primary Care Nathan [name]: Nathan [name] Other Clinician: [name] Referring Nathan [name]: Treating Nathan [name]/Extender: Nathan [name] in Treatment: 3 Vital Signs Time Taken: 12:30 Temperature (F): 97.9 Height (in): 73 Pulse (bpm): 86 Weight (lbs): 183 Respiratory Rate (breaths/min): 20 Body Mass Index (BMI): 24.1 Blood Pressure (mmHg): 144/90 Reference Range: 80 - 120 mg / dl Electronic Signature(s) Signed: 04/09/2022 4:19:58 PM By: Nathan [name] CHT EMT BS , , Entered By: Nathan [name] on 04/09/2022 16:19:57

## 2022-04-09 NOTE — Progress Notes (Signed)
JAMESEN, STAHNKE ([id number]) [id number] Page 1 of 2 Visit Report for 04/09/2022 HBO Details Patient Name: Date of Service: Nathan [name] [name] 04/09/2022 1:00 PM Medical Record Number: [id number] Patient Account Number: [id number] Date of Birth/Sex: Treating RN: 1946-04-06 (76 y.o. 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 HBO Treatment Details Treatment Number: 16 Patient Type: Outpatient Chamber Type: Monoplace Chamber Serial #: [id number] Treatment Protocol: 2.0 ATA with 90 minutes oxygen, and no air breaks Treatment Details Compression Rate Down: 1.5 psi / minute De-Compression Rate Up: 2.0 psi / minute Air breaks and breathing Decompress Decompress Compress Tx Pressure Begins Reached periods Begins Ends (leave unused spaces blank) Chamber Pressure (ATA 1 2 ------2 1 ) Clock Time (24 hr) 12:36 12:49 - - - - - - 14:19 14:27 Treatment Length: 111 (minutes) Treatment Segments: 4 Vital Signs Capillary Blood Glucose Reference Range: 80 - 120 mg / dl HBO Diabetic Blood Glucose Intervention Range: <131 mg/dl or >249 mg/dl Type: Time Vitals Blood Respiratory Capillary Blood Glucose Pulse Action Pulse: Temperature: Taken: Pressure: Rate: Glucose (mg/dl): Meter #: Oximetry (%) Taken: Pre 12:30 144/90 86 20 97.9 none per protocol Post 14:29 132/85 66 18 97.5 none per protocol Treatment Response Treatment Toleration: Well Treatment Completion Status: Treatment Completed without Adverse Event Physician HBO Attestation: I certify that I supervised this HBO treatment in accordance with  Medicare guidelines. A trained emergency response team is readily available per Yes hospital policies and procedures. Continue HBOT as ordered. Yes Electronic Signature(s) Signed: 04/10/2022 10:03:43 AM By: Kalman Shan DO Previous Signature: 04/09/2022 4:21:33 PM Version By: [name] CHT EMT BS , , Entered By: Kalman Shan on 04/10/2022 09:15:52 [name] ([id number]) [id number] Page 2 of 2 -------------------------------------------------------------------------------- HBO Safety Checklist Details Patient Name: Date of Service: Nathan [name] [name] 04/09/2022 1:00 PM Medical Record Number: [id number] Patient Account Number: [id number] Date of Birth/Sex: Treating RN: 30-Oct-1945 (76 y.o. [name], [name] Primary Care Armanda Forand: [name] Other Clinician: [name] Referring Barret Esquivel: Treating Tadd Holtmeyer/Extender: [name] in Treatment: 3 HBO Safety Checklist Items Safety Checklist Consent Form Signed Patient voided / foley secured and emptied When did you last eato 0930 Last dose of injectable or oral agent n/a Ostomy pouch emptied and vented if applicable NA All implantable devices assessed, documented and approved NA Intravenous access site secured and place NA Valuables secured Linens and cotton and cotton/polyester blend (less than 51% polyester) Personal oil-based products / skin lotions / body lotions removed Wigs or hairpieces removed NA Smoking or tobacco materials removed NA Books / newspapers / magazines / loose paper removed Cologne, aftershave, perfume and deodorant removed Jewelry removed (may wrap wedding band) Make-up removed NA Hair care products removed Battery operated devices (external) removed Heating patches and chemical warmers removed Titanium eyewear removed Nail polish cured greater than 10 hours NA Casting material cured greater than 10 hours NA Hearing aids  removed NA Loose dentures or partials removed left at home Prosthetics have been removed NA Patient demonstrates correct use of air break device (if applicable) Patient concerns have been addressed Patient grounding bracelet on and cord attached to chamber Specifics for Inpatients (complete in addition to above) Medication sheet sent with patient NA Intravenous  medications needed or due during therapy sent with patient NA Drainage tubes (e.g. nasogastric tube or chest tube secured and vented) NA Endotracheal or Tracheotomy tube secured NA Cuff deflated of air and inflated with saline NA Airway suctioned NA Notes Paper version used prior to treatment start. Electronic Signature(s) Signed: 04/09/2022 4:20:37 PM By: [name] CHT EMT BS , , Previous Signature: 04/09/2022 4:16:28 PM Version By: [name] CHT EMT BS , , Entered By: [name] on 04/09/2022 16:20:37

## 2022-04-10 ENCOUNTER — Encounter (HOSPITAL_BASED_OUTPATIENT_CLINIC_OR_DEPARTMENT_OTHER): Payer: Medicare Other | Admitting: Internal Medicine

## 2022-04-10 DIAGNOSIS — Z923 Personal history of irradiation: Secondary | ICD-10-CM | POA: Diagnosis not present

## 2022-04-10 DIAGNOSIS — C61 Malignant neoplasm of prostate: Secondary | ICD-10-CM | POA: Diagnosis not present

## 2022-04-10 DIAGNOSIS — N3041 Irradiation cystitis with hematuria: Secondary | ICD-10-CM

## 2022-04-10 NOTE — Progress Notes (Addendum)
MESHILEM, MACHUCA ([id number]) [id number] Page 1 of 2 Visit Report for 04/10/2022 Arrival Information Details Patient Name: Date of Service: Nathan [name] [name] 04/10/2022 1:00 PM Medical Record Number: [id number] Patient Account Number: [id number] Date of Birth/Sex: Treating RN: 1945/11/12 (76 y.o. [name] Primary Care Annaelle Kasel: [name] Other Clinician: [name] Referring Jorgeluis Gurganus: Treating Acasia Skilton/Extender: [name] in Treatment: 3 Visit Information History Since Last Visit All ordered tests and consults were completed: Yes Patient Arrived: [name] Added or deleted any medications: No Arrival Time: 12:28 Any new allergies or adverse reactions: No Accompanied By: self Had a fall or experienced change in No Transfer Assistance: None activities of daily living that may affect Patient Identification Verified: Yes risk of falls: Secondary Verification Process Completed: Yes Signs or symptoms of abuse/neglect since last visito No Patient Requires Transmission-Based Precautions: No Hospitalized since last visit: No Patient Has Alerts: No Implantable device outside of the clinic excluding No cellular tissue based products placed in the center since last visit: Pain Present Now: No Electronic Signature(s) Signed: 04/10/2022 1:55:31 PM By: [name] CHT EMT BS , , Entered By: [name] on 04/10/2022 13:22:45 -------------------------------------------------------------------------------- Encounter Discharge Information Details Patient Name: Date of Service: Nathan [name], [name] 04/10/2022 1:00 PM Medical Record Number: [id number] Patient Account Number: [id number] Date of Birth/Sex: Treating RN: 03-Oct-1945 (76 y.o. [name] Primary Care Odeal Welden: [name] Other Clinician: [name] Referring Aidyn Kellis: Treating Jeyden Coffelt/Extender: [name] in Treatment: 3 Encounter Discharge Information Items Discharge Condition: Stable Ambulatory Status: Cane Discharge Destination: Home Transportation: Private Auto Accompanied By: None Schedule Follow-up Appointment: Yes Clinical Summary of Care: Electronic Signature(s) Signed: 04/10/2022 2:53:54 PM By: [name] EMT Entered By: [name] on 04/10/2022 14:53:54 Nathan [name] ([id number]) [id number] Page 2 of 2 -------------------------------------------------------------------------------- Vitals Details Patient Name: Date of Service: Nathan [name] [name] 04/10/2022 1:00 PM Medical Record Number: [id number] Patient Account Number: [id number] Date of Birth/Sex: Treating RN: 01/13/1946 (76 y.o. [name] Primary Care Everlina Gotts: [name] Other Clinician: [name] Referring Albertine Lafoy: Treating Mckinnley Smithey/Extender: [name] in Treatment: 3 Vital Signs Time Taken: 12:40 Temperature (F): 97.7 Height (in): 73 Pulse (bpm): 75 Weight (lbs): 183 Respiratory Rate (breaths/min): 18 Body Mass Index (BMI): 24.1 Blood Pressure (mmHg): 155/90 Reference Range: 80 - 120 mg / dl Electronic Signature(s) Signed: 04/10/2022 1:55:31 PM By: [name] CHT EMT BS , , Entered By: [name] on 04/10/2022 13:23:14

## 2022-04-10 NOTE — Progress Notes (Signed)
DENIM, KALMBACH ([id number]) [id number] Page 1 of 1 Visit Report for 04/09/2022 SuperBill Details Patient Name: Date of Service: Nathan [name] [name] 04/09/2022 Medical Record Number: [id number] Patient Account Number: [id number] Date of Birth/Sex: Treating RN: 18-Jun-1946 (76 y.o. 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

## 2022-04-10 NOTE — Progress Notes (Signed)
JULIAS, MOULD ([id number]) [id number] Page 1 of 2 Visit Report for 04/10/2022 HBO Details Patient Name: Date of Service: Nathan [name] [name] 04/10/2022 1:00 PM Medical Record Number: [id number] Patient Account Number: [id number] Date of Birth/Sex: Treating RN: 30-Nov-1945 (76 y.o. 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 HBO Treatment Details Treatment Number: 17 Patient Type: Outpatient Chamber Type: Monoplace Chamber Serial #: [id number] Treatment Protocol: 2.0 ATA with 90 minutes oxygen, and no air breaks Treatment Details Compression Rate Down: 2.0 psi / minute De-Compression Rate Up: 2.0 psi / minute Air breaks and breathing Decompress Decompress Compress Tx Pressure Begins Reached periods Begins Ends (leave unused spaces blank) Chamber Pressure (ATA 1 2 ------2 1 ) Clock Time (24 hr) 12:48 12:57 - - - - - - 14:27 14:34 Treatment Length: 106 (minutes) Treatment Segments: 4 Vital Signs Capillary Blood Glucose Reference Range: 80 - 120 mg / dl HBO Diabetic Blood Glucose Intervention Range: <131 mg/dl or >249 mg/dl Type: Time Vitals Blood Respiratory Capillary Blood Glucose Pulse Action Pulse: Temperature: Taken: Pressure: Rate: Glucose (mg/dl): Meter #: Oximetry (%) Taken: Pre 12:40 155/90 75 18 97.7 none per protocol Post 14:37 137/81 77 16 97.7 Treatment Response Treatment Toleration: Well Treatment Completion Status: Treatment Completed without Adverse Event Physician HBO Attestation: I certify that I supervised this HBO treatment in accordance with Medicare guidelines. A  trained emergency response team is readily available per Yes hospital policies and procedures. Continue HBOT as ordered. Yes Electronic Signature(s) Signed: 04/10/2022 4:32:14 PM By: Kalman Shan DO Previous Signature: 04/10/2022 3:08:19 PM Version By: [name] EMT Previous Signature: 04/10/2022 2:52:55 PM Version By: [name] EMT Previous Signature: 04/10/2022 1:55:31 PM Version By: [name] CHT EMT BS , , Entered By: Kalman Shan on 04/10/2022 16:29:44 [name] ([id number]) [id number] Page 2 of 2 -------------------------------------------------------------------------------- HBO Safety Checklist Details Patient Name: Date of Service: Nathan [name] [name] 04/10/2022 1:00 PM Medical Record Number: [id number] Patient Account Number: [id number] Date of Birth/Sex: Treating RN: Jan 12, 1946 (76 y.o. [name] Primary Care Dagmar Adcox: [name] Other Clinician: [name] Referring Saydee Zolman: Treating Elis Sauber/Extender: [name] in Treatment: 3 HBO Safety Checklist Items Safety Checklist Consent Form Signed Patient voided / foley secured and emptied When did you last eato 1100 Last dose of injectable or oral agent n/a Ostomy pouch emptied and vented if applicable NA All implantable devices assessed, documented and approved NA Intravenous access site secured and place NA Valuables secured Linens and cotton and cotton/polyester blend (less than 51% polyester) Personal oil-based products / skin lotions / body lotions removed Wigs or hairpieces removed NA Smoking or tobacco materials removed NA Books / newspapers / magazines / loose paper removed Cologne, aftershave, perfume and deodorant removed Jewelry removed (may wrap wedding band) Make-up removed NA Hair care products removed Battery operated devices (external) removed Heating patches and chemical warmers removed Titanium  eyewear removed Nail polish cured greater than 10 hours Casting material cured greater than 10 hours NA Hearing aids removed NA Loose dentures or partials removed left at home Prosthetics have been removed NA Patient demonstrates correct use of air break device (if applicable) Patient concerns have been addressed Patient grounding bracelet on and cord attached to  chamber Specifics for Inpatients (complete in addition to above) Medication sheet sent with patient NA Intravenous medications needed or due during therapy sent with patient NA Drainage tubes (e.g. nasogastric tube or chest tube secured and vented) NA Endotracheal or Tracheotomy tube secured NA Cuff deflated of air and inflated with saline NA Airway suctioned NA Notes Paper version used prior to treatment start. Electronic Signature(s) Signed: 04/10/2022 1:55:31 PM By: [name] CHT EMT BS , , Entered By: [name] on 04/10/2022 13:24:43

## 2022-04-10 NOTE — Progress Notes (Signed)
JEREMY, DITULLIO ([id number]) [id number] Page 1 of 2 Visit Report for 04/10/2022 Problem List Details Patient Name: Date of Service: Nathan [name] [name] 04/10/2022 1:00 PM Medical Record Number: [id number] Patient Account Number: [id number] Date of Birth/Sex: Treating RN: [birth year] (76 y.o. [name] Primary Care Provider: [name] Other Clinician: [name] Referring Provider: Treating Provider/Extender: [name] in Treatment: 3 Active Problems ICD-10 Encounter Code Description Active Date MDM Diagnosis N30.41 Irradiation cystitis with hematuria 03/14/2022 No Yes Z92.3 Personal history of irradiation 03/14/2022 No Yes C61 Malignant neoplasm of prostate 03/14/2022 No Yes I10 Essential (primary) hypertension 03/14/2022 No Yes I48.0 Paroxysmal atrial fibrillation 03/14/2022 No Yes Inactive Problems Resolved Problems Electronic Signature(s) Signed: 04/10/2022 2:53:27 PM By: [name] EMT Signed: 04/10/2022 4:32:14 PM By: Kalman Shan DO Entered By: [name] on 04/10/2022 14:53:26 -------------------------------------------------------------------------------- SuperBill Details Patient Name: Date of Service: [name], [name] 04/10/2022 Medical Record Number: [id number] Patient Account Number: [id number] Date of Birth/Sex: Treating RN: 1946/03/26 (76 y.o. [name] Primary Care Provider: [name] Other Clinician: [name] Referring Provider: Treating Provider/Extender: [name] in Treatment: 3 Diagnosis [address] 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

## 2022-04-11 ENCOUNTER — Encounter (HOSPITAL_BASED_OUTPATIENT_CLINIC_OR_DEPARTMENT_OTHER): Payer: Medicare Other | Admitting: General Surgery

## 2022-04-11 DIAGNOSIS — N3041 Irradiation cystitis with hematuria: Secondary | ICD-10-CM | POA: Diagnosis not present

## 2022-04-11 NOTE — Progress Notes (Signed)
MEER, REINDL ([id number]) [id number] Page 1 of 1 Visit Report for 04/11/2022 SuperBill Details Patient Name: Date of Service: Nathan [name] [name] 04/11/2022 Medical Record Number: [id number] Patient Account Number: [id number] Date of Birth/Sex: Treating RN: 1946/02/25 (76 y.o. 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

## 2022-04-11 NOTE — Progress Notes (Signed)
IVEY, CINA ([id number]) [id number] Page 1 of 1 Visit Report for 04/11/2022 HBO Safety Checklist Details Patient Name: Date of Service: Nathan [name] [name] 04/11/2022 1:00 PM Medical Record Number: [id number] Patient Account Number: [id number] Date of Birth/Sex: Treating RN: Aug 26, 1945 (76 y.o. Nathan [name] Primary Care Nathan [name]: Nathan [name] Other Clinician: Valeria [name] Referring Nathan [name]: Treating Nathan [name]/Extender: [name] in Treatment: 4 HBO Safety Checklist Items Safety Checklist Consent Form Signed Patient voided / foley secured and emptied When did you last eato 0930 Last dose of injectable or oral agent n/a Ostomy pouch emptied and vented if applicable NA All implantable devices assessed, documented and approved NA Intravenous access site secured and place Valuables secured Linens and cotton and cotton/polyester blend (less than 51% polyester) Personal oil-based products / skin lotions / body lotions removed Wigs or hairpieces removed NA Smoking or tobacco materials removed NA Books / newspapers / magazines / loose paper removed Cologne, aftershave, perfume and deodorant removed Jewelry removed (may wrap wedding band) Make-up removed NA Hair care products removed Battery operated devices (external) removed Heating patches and chemical warmers removed NA Titanium eyewear removed NA Nail polish cured greater than 10 hours NA Casting material cured greater than 10 hours NA Hearing aids removed NA Loose dentures or partials removed dentures removed Prosthetics have been removed NA Patient demonstrates correct use of air break device (if applicable) Patient concerns have been addressed Patient grounding bracelet on and cord attached to chamber Specifics for Inpatients (complete in addition to above) Medication sheet sent with patient NA Intravenous medications needed or due during therapy  sent with NA patient Drainage tubes (e.g. nasogastric tube or chest tube secured and NA vented) Endotracheal or Tracheotomy tube secured NA Cuff deflated of air and inflated with saline NA Airway suctioned NA Notes Paper version used prior to treatment start. Electronic Signature(s) Signed: 04/11/2022 2:07:03 PM By: Nathan [name] CHT EMT BS , , Entered By: Nathan [name] on 04/11/2022 14:07:02

## 2022-04-11 NOTE — Progress Notes (Addendum)
BOBBI, KOZAKIEWICZ ([id number]) [id number] Page 1 of 2 Visit Report for 04/11/2022 Arrival Information Details Patient Name: Date of Service: Nathan [name] [name] 04/11/2022 1:00 PM Medical Record Number: [id number] Patient Account Number: [id number] Date of Birth/Sex: Treating RN: Jul 23, 1945 (76 y.o. Nathan [name], [name] Primary Care [place] Steidle: [name] Other Clinician: [name] Referring Fay Swider: Treating Sela Falk/Extender: [name] in Treatment: 4 Visit Information History Since Last Visit All ordered tests and consults were completed: Yes Patient Arrived: [name] Added or deleted any medications: No Arrival Time: 12:46 Any new allergies or adverse reactions: No Accompanied By: self Had a fall or experienced change in No Transfer Assistance: None activities of daily living that may affect Patient Identification Verified: Yes risk of falls: Secondary Verification Process Completed: Yes Signs or symptoms of abuse/neglect since last visito No Patient Requires Transmission-Based Precautions: No Hospitalized since last visit: No Patient Has Alerts: No Implantable device outside of the clinic excluding No cellular tissue based products placed in the center since last visit: Pain Present Now: No Electronic Signature(s) Signed: 04/11/2022 4:09:37 PM By: [name] CHT EMT BS , , Previous Signature: 04/11/2022 2:03:48 PM Version By: [name] CHT EMT BS , , Entered By: [name] on 04/11/2022 16:09:37 -------------------------------------------------------------------------------- Encounter Discharge Information Details Patient Name: Date of Service: Nathan [name], [name] 04/11/2022 1:00 PM Medical Record Number: [id number] Patient Account Number: [id number] Date of Birth/Sex: Treating RN: 10-26-1945 (76 y.o. Nathan [name] Primary Care Samatha Anspach: [name] Other Clinician: [name] Referring Arne Schlender: Treating Armonte Tortorella/Extender: [name] in Treatment: 4 Encounter Discharge Information Items Discharge Condition: Stable Ambulatory Status: Cane Discharge Destination: Home Transportation: Private Auto Accompanied By: self Schedule Follow-up Appointment: No Clinical Summary of Care: Electronic Signature(s) Signed: 04/11/2022 4:09:24 PM By: [name] CHT EMT BS , , Entered By: [name] on 04/11/2022 16:09:24 [name] ([id number]) [id number] Page 2 of 2 -------------------------------------------------------------------------------- Vitals Details Patient Name: Date of Service: Nathan [name] [name] 04/11/2022 1:00 PM Medical Record Number: [id number] Patient Account Number: [id number] Date of Birth/Sex: Treating RN: December 29, 1945 (76 y.o. Nathan [name] Primary Care Nuriya Stuck: [name] Other Clinician: [name] Referring Yoshiharu Brassell: Treating Cabe Lashley/Extender: [name] in Treatment: 4 Vital Signs Time Taken: 12:46 Temperature (F): 98.1 Height (in): 73 Pulse (bpm): 73 Weight (lbs): 183 Respiratory Rate (breaths/min): 18 Body Mass Index (BMI): 24.1 Blood Pressure (mmHg): 171/83 Reference Range: 80 - 120 mg / dl Electronic Signature(s) Signed: 04/11/2022 2:04:21 PM By: [name] CHT EMT BS , , Entered By: [name] on 04/11/2022 14:04:20

## 2022-04-12 ENCOUNTER — Encounter (HOSPITAL_BASED_OUTPATIENT_CLINIC_OR_DEPARTMENT_OTHER): Payer: Medicare Other | Admitting: General Surgery

## 2022-04-12 DIAGNOSIS — N3041 Irradiation cystitis with hematuria: Secondary | ICD-10-CM | POA: Diagnosis not present

## 2022-04-13 ENCOUNTER — Encounter (HOSPITAL_BASED_OUTPATIENT_CLINIC_OR_DEPARTMENT_OTHER): Payer: Medicare Other | Admitting: Internal Medicine

## 2022-04-13 DIAGNOSIS — N3041 Irradiation cystitis with hematuria: Secondary | ICD-10-CM | POA: Diagnosis not present

## 2022-04-13 NOTE — Progress Notes (Signed)
CROSS, [name] ([id number]) [id number] Page 1 of 2 Visit Report for 04/13/2022 HBO Details Patient Name: Date of Service: Nathan [name] [name] 04/13/2022 1:00 PM Medical Record Number: [id number] Patient Account Number: [id number] Date of Birth/Sex: Treating RN: 08-Oct-1945 (76 y.o. 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 HBO Treatment Details Treatment Number: 20 Patient Type: Outpatient Chamber Type: Monoplace Chamber Serial #: [id number] Treatment Protocol: 2.0 ATA with 90 minutes oxygen, and no air breaks Treatment Details Compression Rate Down: 2.0 psi / minute De-Compression Rate Up: 2.0 psi / minute Air breaks and breathing Decompress Decompress Compress Tx Pressure Begins Reached periods Begins Ends (leave unused spaces blank) Chamber Pressure (ATA 1 2 ------2 1 ) Clock Time (24 hr) 13:07 13:15 - - - - - - 14:45 14:53 Treatment Length: 106 (minutes) Treatment Segments: 4 Vital Signs Capillary Blood Glucose Reference Range: 80 - 120 mg / dl HBO Diabetic Blood Glucose Intervention Range: <131 mg/dl or >249 mg/dl Time Vitals Blood Respiratory Capillary Blood Glucose Pulse Action Type: Pulse: Temperature: Taken: Pressure: Rate: Glucose (mg/dl): Meter #: Oximetry (%) Taken: Pre 13:00 140/82 64 16 98 Post 14:54 161/87 71 18 97.3 Treatment Response Treatment Toleration: Well Treatment Completion Status: Treatment Completed without Adverse Event Jayleena Stille Notes No concerns with treatment given Physician HBO Attestation: I certify that I supervised this HBO treatment in accordance with  Medicare guidelines. A trained emergency response team is readily available per Yes hospital policies and procedures. Continue HBOT as ordered. Yes Electronic Signature(s) Signed: 04/16/2022 9:04:33 AM By: Linton Ham MD Previous Signature: 04/13/2022 3:28:50 PM Version By: [name] EMT Entered By: Linton Ham on 04/13/2022 17:01:07 [name] ([id number]) [id number] Page 2 of 2 -------------------------------------------------------------------------------- HBO Safety Checklist Details Patient Name: Date of Service: Nathan [name] [name] 04/13/2022 1:00 PM Medical Record Number: [id number] Patient Account Number: [id number] Date of Birth/Sex: Treating RN: [birth year] (76 y.o. Nathan [name] Primary Care Desree Leap: [name] Other Clinician: Referring Karri Kallenbach: Treating Keyle Doby/Extender: Erskine Emery, Penelope Galas in Treatment: 4 HBO Safety Checklist Items Safety Checklist Consent Form Signed Patient voided / foley secured and emptied When did you last eato 1000 Last dose of injectable or oral agent NA Ostomy pouch emptied and vented if applicable NA All implantable devices assessed, documented and approved NA Intravenous access site secured and place NA Valuables secured Linens and cotton and cotton/polyester blend (less than 51% polyester) Personal oil-based products / skin lotions / body lotions removed Wigs or hairpieces removed NA Smoking or tobacco materials removed Books / newspapers / magazines / loose paper removed Cologne, aftershave, perfume and deodorant removed Jewelry removed (may wrap wedding band) NA Make-up removed NA Hair care products removed Battery operated devices (external) removed Heating patches and chemical warmers removed Titanium eyewear removed NA Nail polish cured greater than 10 hours NA Casting material cured greater than 10 hours NA Hearing aids removed NA Loose dentures or  partials removed NA Prosthetics have been removed NA Patient demonstrates correct use of air break device (if applicable) Patient concerns have been addressed Patient grounding bracelet on and cord attached to chamber Specifics for Inpatients (complete in addition to above) Medication sheet sent with patient NA Intravenous medications needed or due during therapy sent with  patient NA Drainage tubes (e.g. nasogastric tube or chest tube secured and vented) NA Endotracheal or Tracheotomy tube secured NA Cuff deflated of air and inflated with saline NA Airway suctioned NA Notes The safety checklist was done before the treatment was started. Electronic Signature(s) Signed: 04/13/2022 3:27:22 PM By: [name] EMT Entered By: [name] on 04/13/2022 15:27:22

## 2022-04-13 NOTE — Progress Notes (Addendum)
Nathan, [name] ([id number]) [id number] Page 1 of 2 Visit Report for 04/13/2022 Arrival Information Details Patient Name: Date of Service: Nathan [name] [name] 04/13/2022 1:00 PM Medical Record Number: [id number] Patient Account Number: [id number] Date of Birth/Sex: Treating RN: Oct 11, 1945 (76 y.o. [name] Primary Care Lavar Rosenzweig: [name] Other Clinician: Referring Delora Gravatt: Treating Loneta Tamplin/Extender: Erskine Emery, Penelope Galas in Treatment: 4 Visit Information History Since Last Visit All ordered tests and consults were completed: Yes Patient Arrived: Ambulatory Added or deleted any medications: No Arrival Time: 12:50 Any new allergies or adverse reactions: No Accompanied By: None Had a fall or experienced change in No Transfer Assistance: None activities of daily living that may affect Patient Identification Verified: Yes risk of falls: Secondary Verification Process Completed: Yes Signs or symptoms of abuse/neglect since last visito No Patient Requires Transmission-Based Precautions: No Hospitalized since last visit: No Patient Has Alerts: No Implantable device outside of the clinic excluding No cellular tissue based products placed in the center since last visit: Pain Present Now: No Electronic Signature(s) Signed: 04/13/2022 3:02:00 PM By: [name] EMT Entered By: [name] on 04/13/2022 15:02:00 -------------------------------------------------------------------------------- Encounter Discharge Information Details Patient Name: Date of Service: Nathan [name], [name] 04/13/2022 1:00 PM Medical Record Number: [id number] Patient Account Number: [id number] Date of Birth/Sex: Treating RN: 05/09/1946 (76 y.o. [name] Primary Care Navneet Schmuck: [name] Other Clinician: Referring [name]: Treating Berkeley Veldman/Extender: Erskine Emery, Penelope Galas in Treatment: 4 Encounter Discharge  Information Items Discharge Condition: Stable Ambulatory Status: Ambulatory Discharge Destination: Home Transportation: Private Auto Accompanied By: None Schedule Follow-up Appointment: Yes Clinical Summary of Care: Electronic Signature(s) Signed: 04/13/2022 3:29:39 PM By: [name] EMT Entered By: [name] on 04/13/2022 15:29:39 Nathan [name] ([id number]) [id number] Page 2 of 2 -------------------------------------------------------------------------------- Vitals Details Patient Name: Date of Service: Nathan [name] [name] 04/13/2022 1:00 PM Medical Record Number: [id number] Patient Account Number: [id number] Date of Birth/Sex: Treating RN: July 11, 1945 (76 y.o. [name] Primary Care Merrill Deanda: [name] Other Clinician: Referring Ofelia Podolski: Treating Kerby Borner/Extender: [name] in Treatment: 4 Vital Signs Time Taken: 13:00 Temperature (F): 98.0 Height (in): 73 Pulse (bpm): 64 Weight (lbs): 183 Respiratory Rate (breaths/min): 16 Body Mass Index (BMI): 24.1 Blood Pressure (mmHg): 140/82 Reference Range: 80 - 120 mg / dl Electronic Signature(s) Signed: 04/13/2022 3:04:14 PM By: [name] EMT Entered By: [name] on 04/13/2022 15:04:14

## 2022-04-16 ENCOUNTER — Encounter (HOSPITAL_BASED_OUTPATIENT_CLINIC_OR_DEPARTMENT_OTHER): Payer: Medicare Other | Admitting: Internal Medicine

## 2022-04-16 DIAGNOSIS — N3041 Irradiation cystitis with hematuria: Secondary | ICD-10-CM | POA: Diagnosis not present

## 2022-04-16 DIAGNOSIS — Z923 Personal history of irradiation: Secondary | ICD-10-CM | POA: Diagnosis not present

## 2022-04-16 DIAGNOSIS — C61 Malignant neoplasm of prostate: Secondary | ICD-10-CM

## 2022-04-16 NOTE — Progress Notes (Addendum)
SULEMAN, GUNNING ([id number]) [id number] Page 1 of 2 Visit Report for 04/16/2022 HBO Details Patient Name: Date of Service: Nathan [name] 04/16/2022 1:00 PM Medical Record Number: [id number] Patient Account Number: [id number] Date of Birth/Sex: Treating RN: 23-Feb-1946 (76 y.o. 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 HBO Treatment Details Treatment Number: 21 Patient Type: Outpatient Chamber Type: Monoplace Chamber Serial #: [id number] Treatment Protocol: 2.0 ATA with 90 minutes oxygen, and no air breaks Treatment Details Compression Rate Down: 2.0 psi / minute De-Compression Rate Up: 2.0 psi / minute Air breaks and breathing Decompress Decompress Compress Tx Pressure Begins Reached periods Begins Ends (leave unused spaces blank) Chamber Pressure (ATA 1 2 ------2 1 ) Clock Time (24 hr) 12:48 12:57 - - - - - - 14:27 14:35 Treatment Length: 107 (minutes) Treatment Segments: 4 Vital Signs Capillary Blood Glucose Reference Range: 80 - 120 mg / dl HBO Diabetic Blood Glucose Intervention Range: <131 mg/dl or >249 mg/dl Type: Time Vitals Blood Respiratory Capillary Blood Glucose Pulse Action Pulse: Temperature: Taken: Pressure: Rate: Glucose (mg/dl): Meter #: Oximetry (%) Taken: Pre 12:42 164/87 74 18 97.5 none per protocol Post 14:36 137/81 66 18 97.9 none per protocol Treatment Response Treatment Toleration: Well Treatment Completion Status: Treatment Completed without Adverse Event Physician HBO Attestation: I certify that I supervised this HBO treatment in accordance with  Medicare guidelines. A trained emergency response team is readily available per Yes hospital policies and procedures. Continue HBOT as ordered. Yes Electronic Signature(s) Signed: 04/16/2022 1:28:24 PM By: Nathan [name] Previous Signature: 04/16/2022 4:07:15 PM Version By: [name] CHT EMT BS , , Entered By: Nathan Shan on 04/16/2022 16:27:39 Nathan [name] ([id number]) [id number] Page 2 of 2 -------------------------------------------------------------------------------- HBO Safety Checklist Details Patient Name: Date of Service: Nathan [name] 04/16/2022 1:00 PM Medical Record Number: [id number] Patient Account Number: [id number] Date of Birth/Sex: Treating RN: 07-29-1945 (76 y.o. [name] Primary Care Yan Pankratz: [name] Other Clinician: [name] Referring Twila Rappa: Treating Cecilia Nishikawa/Extender: [name] in Treatment: 4 HBO Safety Checklist Items Safety Checklist Consent Form Signed Patient voided / foley secured and emptied Foley When did you last eato 1000 Last dose of injectable or oral agent n/a Ostomy pouch emptied and vented if applicable NA All implantable devices assessed, documented and approved NA Intravenous access site secured and place NA Valuables secured Linens and cotton and cotton/polyester blend (less than 51% polyester) Personal oil-based products / skin lotions / body lotions removed Wigs or hairpieces removed NA Smoking or tobacco materials removed NA Books / newspapers / magazines / loose paper removed Cologne, aftershave, perfume and deodorant removed Jewelry removed (may wrap wedding band) Make-up removed NA Hair care products removed Battery operated devices (external) removed Heating patches and chemical warmers removed Titanium eyewear removed NA Nail polish cured greater than 10 hours NA Casting material cured greater than 10  hours NA Hearing aids removed NA Loose dentures or partials removed NA Prosthetics have been removed NA Patient demonstrates correct use of air break device (if applicable) Patient concerns have been addressed Patient grounding bracelet on and cord attached to chamber Specifics for Inpatients (complete in addition to above) Medication sheet sent with patient NA Intravenous  medications needed or due during therapy sent with patient NA Drainage tubes (e.g. nasogastric tube or chest tube secured and vented) NA Endotracheal or Tracheotomy tube secured NA Cuff deflated of air and inflated with saline NA Airway suctioned NA Notes Paper version used prior to treatment. Electronic Signature(s) Signed: 04/16/2022 4:19:35 PM By: [name] CHT EMT BS , , Previous Signature: 04/16/2022 1:06:00 PM Version By: [name] CHT EMT BS , , Entered By: [name] on 04/16/2022 16:19:35

## 2022-04-16 NOTE — Progress Notes (Signed)
REMY, VOILES ([id number]) [id number] Page 1 of 2 Visit Report for 04/13/2022 Problem List Details Patient Name: Date of Service: Nathan [name] [name] 04/13/2022 1:00 PM Medical Record Number: [id number] Patient Account Number: [id number] Date of Birth/Sex: Treating RN: 09/24/1945 (76 y.o. [name] Primary Care Provider: [name] Other Clinician: Referring Provider: Treating Provider/Extender: Erskine Emery, Penelope Galas in Treatment: 4 Active Problems ICD-10 Encounter Code Description Active Date MDM Diagnosis N30.41 Irradiation cystitis with hematuria 03/14/2022 No Yes Z92.3 Personal history of irradiation 03/14/2022 No Yes C61 Malignant neoplasm of prostate 03/14/2022 No Yes I10 Essential (primary) hypertension 03/14/2022 No Yes I48.0 Paroxysmal atrial fibrillation 03/14/2022 No Yes Inactive Problems Resolved Problems Electronic Signature(s) Signed: 04/13/2022 3:29:13 PM By: [name] EMT Signed: 04/16/2022 9:04:33 AM By: Linton Ham MD Entered By: [name] on 04/13/2022 15:29:13 -------------------------------------------------------------------------------- SuperBill Details Patient Name: Date of Service: Nathan [name], Nathan [name] 04/13/2022 Medical Record Number: [id number] Patient Account Number: [id number] Date of Birth/Sex: Treating RN: 1946-07-01 (76 y.o. [name] Primary Care Provider: [name] Other Clinician: Referring Provider: Treating Provider/Extender: [name] in Treatment: 4 Diagnosis [address] 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

## 2022-04-16 NOTE — Progress Notes (Signed)
TOREY, REINARD ([id number]) [id number] Page 1 of 1 Visit Report for 04/16/2022 SuperBill Details Patient Name: Date of Service: Nathan [name] [name] 04/16/2022 Medical Record Number: [id number] Patient Account Number: [id number] Date of Birth/Sex: Treating RN: September 12, 1945 (76 y.o. 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

## 2022-04-16 NOTE — Progress Notes (Signed)
Nathan [name] ([id number]) [id number] Page 1 of 2 Visit Report for 04/16/2022 Arrival Information Details Patient Name: Date of Service: Nathan [name] [name] 04/16/2022 1:00 PM Medical Record Number: [id number] Patient Account Number: [id number] Date of Birth/Sex: Treating RN: [birth year] (76 y.o. [name] Primary Care Arion Morgan: [name] Other Clinician: [name] Referring Rhythm Wigfall: Treating Naren Benally/Extender: [name] in Treatment: 4 Visit Information History Since Last Visit All ordered tests and consults were completed: Yes Patient Arrived: [name] Added or deleted any medications: No Arrival Time: 12:30 Any new allergies or adverse reactions: No Accompanied By: self Had a fall or experienced change in No Transfer Assistance: None activities of daily living that may affect Patient Identification Verified: Yes risk of falls: Secondary Verification Process Completed: Yes Signs or symptoms of abuse/neglect since last visito No Patient Requires Transmission-Based Precautions: No Hospitalized since last visit: No Patient Has Alerts: No Implantable device outside of the clinic excluding No cellular tissue based products placed in the center since last visit: Pain Present Now: No Electronic Signature(s) Signed: 04/16/2022 1:08:13 PM By: [name] CHT EMT BS , , Previous Signature: 04/16/2022 1:03:52 PM Version By: [name] CHT EMT BS , , Entered By: [name] on 04/16/2022 16:08:12 -------------------------------------------------------------------------------- Encounter Discharge Information Details Patient Name: Date of Service: Nathan [name], [name] 04/16/2022 1:00 PM Medical Record Number: [id number] Patient Account Number: [id number] Date of Birth/Sex: Treating RN: 01-Aug-1945 (76 y.o. [name] Primary Care Loring Liskey: [name] Other Clinician: [name] Referring Kelechi Orgeron: Treating Raychell Holcomb/Extender: [name] in Treatment: 4 Encounter Discharge Information Items Discharge Condition: Stable Ambulatory Status: Cane Discharge Destination: Home Transportation: Private Auto Accompanied By: self Schedule Follow-up Appointment: No Clinical Summary of Care: Electronic Signature(s) Signed: 04/16/2022 4:07:56 PM By: [name] CHT EMT BS , , Entered By: [name] on 04/16/2022 16:07:56 [name] ([id number]) [id number] Page 2 of 2 -------------------------------------------------------------------------------- Vitals Details Patient Name: Date of Service: Nathan [name] [name] 04/16/2022 1:00 PM Medical Record Number: [id number] Patient Account Number: [id number] Date of Birth/Sex: Treating RN: [birth year] (76 y.o. [name] Primary Care Humphrey Guerreiro: [name] Other Clinician: [name] Referring Mayleen Borrero: Treating Katelyne Galster/Extender: [name] in Treatment: 4 Vital Signs Time Taken: 12:42 Temperature (F): 97.5 Height (in): 73 Pulse (bpm): 74 Weight (lbs): 183 Respiratory Rate (breaths/min): 18 Body Mass Index (BMI): 24.1 Blood Pressure (mmHg): 164/87 Reference Range: 80 - 120 mg / dl Electronic Signature(s) Signed: 04/16/2022 1:04:32 PM By: [name] CHT EMT BS , , Entered By: [name] on 04/16/2022 16:04:31

## 2022-04-17 ENCOUNTER — Encounter (HOSPITAL_BASED_OUTPATIENT_CLINIC_OR_DEPARTMENT_OTHER): Payer: Medicare Other | Admitting: Internal Medicine

## 2022-04-17 DIAGNOSIS — C61 Malignant neoplasm of prostate: Secondary | ICD-10-CM

## 2022-04-17 DIAGNOSIS — Z923 Personal history of irradiation: Secondary | ICD-10-CM

## 2022-04-17 DIAGNOSIS — N3041 Irradiation cystitis with hematuria: Secondary | ICD-10-CM

## 2022-04-17 NOTE — Progress Notes (Signed)
MARSHA, HILLMAN ([id number]) [id number] Page 1 of 2 Visit Report for 04/17/2022 Problem List Details Patient Name: Date of Service: Nathan [name] [name] 04/17/2022 1:00 PM Medical Record Number: [id number] Patient Account Number: [id number] Date of Birth/Sex: Treating RN: 04/02/1946 (76 y.o. [name] Primary Care Provider: [name] Other Clinician: Referring Provider: Treating Provider/Extender: [name] in Treatment: 4 Active Problems ICD-10 Encounter Code Description Active Date MDM Diagnosis N30.41 Irradiation cystitis with hematuria 03/14/2022 No Yes Z92.3 Personal history of irradiation 03/14/2022 No Yes C61 Malignant neoplasm of prostate 03/14/2022 No Yes I10 Essential (primary) hypertension 03/14/2022 No Yes I48.0 Paroxysmal atrial fibrillation 03/14/2022 No Yes Inactive Problems Resolved Problems Electronic Signature(s) Signed: 04/17/2022 2:49:47 PM By: [name] EMT Signed: 04/17/2022 4:33:46 PM By: Kalman Shan DO Entered By: [name] on 04/17/2022 14:49:47 -------------------------------------------------------------------------------- SuperBill Details Patient Name: Date of Service: [name], [name] 04/17/2022 Medical Record Number: [id number] Patient Account Number: [id number] Date of Birth/Sex: Treating RN: [birth year] (76 y.o. [name] Primary Care Provider: [name] Other Clinician: Referring Provider: Treating Provider/Extender: [name] in Treatment: 4 Diagnosis [address] [name], [name] ([id number]) [id number] Page 2 of 2 ICD-10 Codes Code Description 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 Personal history of irradiation C61 Malignant neoplasm of prostate I10 Essential (primary) hypertension I48.0 Paroxysmal atrial fibrillation Facility Procedures : CPT4 Code Description: [id number]  G0277-(Facility Use Only) HBOT full body chamber, 37mn , ICD-10 Diagnosis Description 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 Personal history of irradiation C61 Malignant neoplasm of prostate Modifier: Quantity: 4 Physician Procedures : CPT4 Code Description Modifier [id number]- WC PHYS HYPERBARIC OXYGEN THERAPY ICD-10 Diagnosis Description 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 Personal history of irradiation C61 Malignant neoplasm of prostate Quantity: 1 Electronic Signature(s) Signed: 04/17/2022 2:49:39 PM By: GValeria BatmanEMT Signed: 04/17/2022 4:33:46 PM By: [name] Entered By: GValeria Batmanon 04/17/2022 14:49:38

## 2022-04-17 NOTE — Progress Notes (Signed)
Nathan [name] ([id number]) [id number] Page 1 of 2 Visit Report for 04/17/2022 Arrival Information Details Patient Name: Date of Service: Nathan [name] 04/17/2022 1:00 PM Medical Record Number: [id number] Patient Account Number: [id number] Date of Birth/Sex: Treating RN: 1945/12/30 (76 y.o. [name] Primary Care Collin Rengel: [name] Other Clinician: Referring Messiah Rovira: Treating Normajean Nash/Extender: [name] in Treatment: 4 Visit Information History Since Last Visit All ordered tests and consults were completed: Yes Patient Arrived: Ambulatory Added or deleted any medications: No Arrival Time: 12:27 Any new allergies or adverse reactions: No Accompanied By: None Had a fall or experienced change in No Transfer Assistance: None activities of daily living that may affect Patient Identification Verified: Yes risk of falls: Secondary Verification Process Completed: Yes Signs or symptoms of abuse/neglect since last visito No Patient Requires Transmission-Based Precautions: No Hospitalized since last visit: No Patient Has Alerts: No Implantable device outside of the clinic excluding No cellular tissue based products placed in the center since last visit: Pain Present Now: No Electronic Signature(s) Signed: 04/17/2022 2:45:50 PM By: [name] EMT Entered By: [name] on 04/17/2022 14:45:49 -------------------------------------------------------------------------------- Encounter Discharge Information Details Patient Name: Date of Service: Nathan [name] [name] 04/17/2022 1:00 PM Medical Record Number: [id number] Patient Account Number: [id number] Date of Birth/Sex: Treating RN: 17-Sep-1945 (77 y.o. [name] Primary Care Brianah Hopson: [name] Other Clinician: Referring Henry Utsey: Treating Quy Lotts/Extender: [name] in Treatment: 4 Encounter Discharge  Information Items Discharge Condition: Stable Ambulatory Status: Ambulatory Discharge Destination: Home Transportation: Private Auto Accompanied By: None Schedule Follow-up Appointment: Yes Clinical Summary of Care: Electronic Signature(s) Signed: 04/17/2022 2:50:21 PM By: [name] EMT Entered By: [name] on 04/17/2022 14:50:21 Nathan [name] ([id number]) [id number] Page 2 of 2 -------------------------------------------------------------------------------- Vitals Details Patient Name: Date of Service: Nathan [name] 04/17/2022 1:00 PM Medical Record Number: [id number] Patient Account Number: [id number] Date of Birth/Sex: Treating RN: 05/24/1946 (76 y.o. [name] Primary Care Redding Cloe: [name] Other Clinician: Referring Kimberlyann Hollar: Treating Rett Stehlik/Extender: [name] in Treatment: 4 Vital Signs Time Taken: 12:40 Temperature (F): 97.3 Height (in): 73 Pulse (bpm): 89 Weight (lbs): 183 Respiratory Rate (breaths/min): 16 Body Mass Index (BMI): 24.1 Blood Pressure (mmHg): 171/97 Reference Range: 80 - 120 mg / dl Electronic Signature(s) Signed: 04/17/2022 2:46:17 PM By: [name] EMT Entered By: [name] on 04/17/2022 14:46:16

## 2022-04-17 NOTE — Progress Notes (Addendum)
LOPEZ, DENTINGER ([id number]) [id number] Page 1 of 2 Visit Report for 04/17/2022 HBO Details Patient Name: Date of Service: Nathan [name] [name] 04/17/2022 1:00 PM Medical Record Number: [id number] Patient Account Number: [id number] Date of Birth/Sex: Treating RN: 08-25-1945 (76 y.o. 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 HBO Treatment Details Treatment Number: 22 Patient Type: Outpatient Chamber Type: Monoplace Chamber Serial #: [id number] Treatment Protocol: 2.0 ATA with 90 minutes oxygen, and no air breaks Treatment Details Compression Rate Down: 2.0 psi / minute De-Compression Rate Up: 2.0 psi / minute Air breaks and breathing Decompress Decompress Compress Tx Pressure Begins Reached periods Begins Ends (leave unused spaces blank) Chamber Pressure (ATA 1 2 ------2 1 ) Clock Time (24 hr) 12:47 12:56 - - - - - - 14:26 14:33 Treatment Length: 106 (minutes) Treatment Segments: 4 Vital Signs Capillary Blood Glucose Reference Range: 80 - 120 mg / dl HBO Diabetic Blood Glucose Intervention Range: <131 mg/dl or >249 mg/dl Time Vitals Blood Respiratory Capillary Blood Glucose Pulse Action Type: Pulse: Temperature: Taken: Pressure: Rate: Glucose (mg/dl): Meter #: Oximetry (%) Taken: Pre 12:40 171/97 89 16 97.3 Post 12:34 141/80 72 18 97.2 Treatment Response Treatment Toleration: Well Treatment Completion Status: Treatment Completed without Adverse Event Physician HBO Attestation: I certify that I supervised this HBO treatment in accordance with Medicare guidelines. A trained emergency response team is  readily available per Yes hospital policies and procedures. Continue HBOT as ordered. Yes Electronic Signature(s) Signed: 04/17/2022 4:33:46 PM By: Kalman Shan DO Previous Signature: 04/17/2022 2:49:16 PM Version By: [name] EMT Entered By: Kalman Shan on 04/17/2022 16:30:50 [name] ([id number]) [id number] Page 2 of 2 -------------------------------------------------------------------------------- HBO Safety Checklist Details Patient Name: Date of Service: Nathan [name] [name] 04/17/2022 1:00 PM Medical Record Number: [id number] Patient Account Number: [id number] Date of Birth/Sex: Treating RN: 1946-05-03 (76 y.o. [name] Primary Care Deran Barro: [name] Other Clinician: Referring Mariusz Jubb: Treating Shawan Tosh/Extender: [name] in Treatment: 4 HBO Safety Checklist Items Safety Checklist Consent Form Signed Patient voided / foley secured and emptied When did you last eato 0930 Last dose of injectable or oral agent NA Ostomy pouch emptied and vented if applicable NA All implantable devices assessed, documented and approved NA Intravenous access site secured and place NA Valuables secured Linens and cotton and cotton/polyester blend (less than 51% polyester) Personal oil-based products / skin lotions / body lotions removed Wigs or hairpieces removed NA Smoking or tobacco materials removed Books / newspapers / magazines / loose paper removed Cologne, aftershave, perfume and deodorant removed Jewelry removed (may wrap wedding band) NA Make-up removed NA Hair care products removed Battery operated devices (external) removed Heating patches and chemical warmers removed Titanium eyewear removed NA Nail polish cured greater than 10 hours NA Casting material cured greater than 10 hours NA Hearing aids removed NA Loose dentures or partials removed NA Prosthetics have been  removed NA Patient demonstrates correct use of air break device (if applicable) Patient concerns have been addressed Patient grounding bracelet on and cord attached to chamber Specifics for Inpatients (complete in addition to above) Medication sheet sent with patient NA Intravenous medications needed or due during therapy sent with patient NA Drainage tubes (e.g. nasogastric tube  or chest tube secured and vented) NA Endotracheal or Tracheotomy tube secured NA Cuff deflated of air and inflated with saline NA Airway suctioned NA Notes The safety checklist was done before the treatment was started. Electronic Signature(s) Signed: 04/17/2022 2:47:45 PM By: [name] EMT Entered By: [name] on 04/17/2022 14:47:44

## 2022-04-18 ENCOUNTER — Telehealth: Payer: Self-pay | Admitting: *Deleted

## 2022-04-18 ENCOUNTER — Encounter (HOSPITAL_BASED_OUTPATIENT_CLINIC_OR_DEPARTMENT_OTHER): Payer: Medicare Other | Admitting: General Surgery

## 2022-04-18 DIAGNOSIS — N3041 Irradiation cystitis with hematuria: Secondary | ICD-10-CM | POA: Diagnosis not present

## 2022-04-18 NOTE — Progress Notes (Signed)
Nathan, [name] ([id number]) [id number] Page 1 of 2 Visit Report for 04/18/2022 HBO Details Patient Name: Date of Service: Nathan [name] [name] 04/18/2022 1:00 PM Medical Record Number: [id number] Patient Account Number: [id number] Date of Birth/Sex: Treating RN: May 28, 1946 (76 y.o. 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 HBO Treatment Details Treatment Number: 23 Patient Type: Outpatient Chamber Type: Monoplace Chamber Serial #: [id number] Treatment Protocol: 2.0 ATA with 90 minutes oxygen, and no air breaks Treatment Details Compression Rate Down: 2.0 psi / minute De-Compression Rate Up: 2.0 psi / minute Air breaks and breathing Decompress Decompress Compress Tx Pressure Begins Reached periods Begins Ends (leave unused spaces blank) Chamber Pressure (ATA 1 2 ------2 1 ) Clock Time (24 hr) 12:46 12:59 - - - - - - 14:29 14:36 Treatment Length: 110 (minutes) Treatment Segments: 4 Vital Signs Capillary Blood Glucose Reference Range: 80 - 120 mg / dl HBO Diabetic Blood Glucose Intervention Range: <131 mg/dl or >249 mg/dl Type: Time Vitals Blood Respiratory Capillary Blood Glucose Pulse Action Pulse: Temperature: Taken: Pressure: Rate: Glucose (mg/dl): Meter #: Oximetry (%) Taken: Pre 12:40 138/80 70 20 98.1 none per protocol Post 14:40 138/75 68 18 97.2 none per protocol Treatment Response Treatment Toleration: Well Treatment Completion Status: Treatment Completed without Adverse Event Physician HBO Attestation: I certify that I supervised this HBO treatment in accordance with  Medicare guidelines. A trained emergency response team is readily available per Yes hospital policies and procedures. Continue HBOT as ordered. Yes Electronic Signature(s) Signed: 04/18/2022 4:45:07 PM By: Nathan Maudlin MD FACS Previous Signature: 04/18/2022 4:43:50 PM Version By: Nathan [name] , , Entered By: Nathan [name] on 04/18/2022 16:45:07 Nathan [name] ([id number]) [id number] Page 2 of 2 -------------------------------------------------------------------------------- HBO Safety Checklist Details Patient Name: Date of Service: Nathan [name] [name] 04/18/2022 1:00 PM Medical Record Number: [id number] Patient Account Number: [id number] Date of Birth/Sex: Treating RN: 11-10-1945 (76 y.o. Nathan [name] Primary Care Nathan [name]: Nathan [name] Other Clinician: [name] Referring Nathan [name]: Treating Nathan [name]/Extender: Nathan [name] Cancer in Treatment: 5 HBO Safety Checklist Items Safety Checklist Consent Form Signed Patient voided / foley secured and emptied When did you last eato 0900 Last dose of injectable or oral agent n/a Ostomy pouch emptied and vented if applicable NA All implantable devices assessed, documented and approved NA Intravenous access site secured and place NA Valuables secured Linens and cotton and cotton/polyester blend (less than 51% polyester) Personal oil-based products / skin lotions / body lotions removed Wigs or hairpieces removed NA Smoking or tobacco materials removed NA Books / newspapers / magazines / loose paper removed Cologne, aftershave, perfume and deodorant removed Jewelry removed (may wrap wedding band) Make-up removed NA Hair care products removed Battery operated devices (external) removed Heating patches and chemical warmers removed Titanium eyewear removed NA Nail polish cured greater than 10 hours NA Casting material cured greater than 10  hours NA Hearing aids removed NA Loose dentures or partials removed Prosthetics have been removed NA Patient demonstrates correct use of air break device (if applicable) Patient concerns have been addressed Patient grounding bracelet on and cord attached to chamber Specifics for Inpatients (complete in addition to above) Medication sheet sent with patient NA Intravenous medications  needed or due during therapy sent with patient NA Drainage tubes (e.g. nasogastric tube or chest tube secured and vented) NA Endotracheal or Tracheotomy tube secured NA Cuff deflated of air and inflated with saline NA Airway suctioned NA Notes Paper version used prior to treatment. Electronic Signature(s) Signed: 04/18/2022 4:42:36 PM By: Nathan [name] , , Entered By: Nathan [name] on 04/18/2022 16:42:36

## 2022-04-18 NOTE — Telephone Encounter (Signed)
Daughter Kenney Houseman called stating patients right leg was very swollen "blown up" and painful. I spoke to Dr Donzetta Matters.  He ordered a DVT study of right leg and an office visit with PA. I  informed daughter and she voiced understanding.

## 2022-04-18 NOTE — Progress Notes (Signed)
Nathan [name] ([id number]) [id number] Page 1 of 2 Visit Report for 04/18/2022 Arrival Information Details Patient Name: Date of Service: Nathan [name] [name] 04/18/2022 1:00 PM Medical Record Number: [id number] Patient Account Number: [id number] Date of Birth/Sex: Treating RN: 1945-08-01 (76 y.o. [name] Primary Care Exie Chrismer: [name] Other Clinician: [name] Referring Andreia Gandolfi: Treating Gorden Stthomas/Extender: [name] in Treatment: 5 Visit Information History Since Last Visit All ordered tests and consults were completed: Yes Patient Arrived: [name] Added or deleted any medications: No Arrival Time: 12:27 Any new allergies or adverse reactions: No Accompanied By: self Had a fall or experienced change in No Transfer Assistance: None activities of daily living that may affect Patient Identification Verified: Yes risk of falls: Secondary Verification Process Completed: Yes Signs or symptoms of abuse/neglect since last visito No Patient Requires Transmission-Based Precautions: No Hospitalized since last visit: No Patient Has Alerts: No Implantable device outside of the clinic excluding No cellular tissue based products placed in the center since last visit: Pain Present Now: No Electronic Signature(s) Signed: 04/18/2022 4:41:21 PM By: [name] CHT EMT BS , , Entered By: [name] on 04/18/2022 16:41:21 -------------------------------------------------------------------------------- Encounter Discharge Information Details Patient Name: Date of Service: Nathan [name], [name] 04/18/2022 1:00 PM Medical Record Number: [id number] Patient Account Number: [id number] Date of Birth/Sex: Treating RN: Dec 13, 1945 (76 y.o. [name] Primary Care Andrew Blasius: [name] Other Clinician: [name] Referring Haddon Fyfe: Treating Brody Kump/Extender: [name] in Treatment: 5 Encounter Discharge Information Items Discharge Condition: Stable Ambulatory Status: Cane Discharge Destination: Home Transportation: Private Auto Accompanied By: self Schedule Follow-up Appointment: No Clinical Summary of Care: Electronic Signature(s) Signed: 04/18/2022 4:44:47 PM By: [name] CHT EMT BS , , Entered By: [name] on 04/18/2022 16:44:47 [name] ([id number]) [id number] Page 2 of 2 -------------------------------------------------------------------------------- Vitals Details Patient Name: Date of Service: Nathan [name] [name] 04/18/2022 1:00 PM Medical Record Number: [id number] Patient Account Number: [id number] Date of Birth/Sex: Treating RN: 1945/08/06 (76 y.o. [name] Primary Care Kainen Struckman: [name] Other Clinician: [name] Referring Chayton Murata: Treating Motty Borin/Extender: [name] in Treatment: 5 Vital Signs Time Taken: 12:40 Temperature (F): 98.1 Height (in): 73 Pulse (bpm): 70 Weight (lbs): 183 Respiratory Rate (breaths/min): 20 Body Mass Index (BMI): 24.1 Blood Pressure (mmHg): 138/80 Reference Range: 80 - 120 mg / dl Electronic Signature(s) Signed: 04/18/2022 4:49:43 PM By: [name] CHT EMT BS , , Entered By: [name] on 04/18/2022 16:49:42

## 2022-04-18 NOTE — Telephone Encounter (Signed)
Daughter called back stating patient would not be able to come to the appointment that was scheduled at [facility] tomorrow due to an appointment at [facility] wound care center.  I suggested to have patient seen in the ED after his wound treatment. A message was left on voice mail at 4:11 pm.

## 2022-04-18 NOTE — Progress Notes (Signed)
SALIM, FORERO ([id number]) [id number] Page 1 of 1 Visit Report for 04/18/2022 SuperBill Details Patient Name: Date of Service: Nathan [name] [name] 04/18/2022 Medical Record Number: [id number] Patient Account Number: [id number] Date of Birth/Sex: Treating RN: March 29, 1946 (76 y.o. [name] Primary Care Provider: [name] Other Clinician: [name] Referring Provider: Treating Provider/Extender: [name] in Treatment: 5 Diagnosis Coding ICD-10 Codes Code Description 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 Personal history of irradiation C61 Malignant neoplasm of prostate I10 Essential (primary) hypertension I48.0 Paroxysmal atrial fibrillation Facility Procedures CPT4 Code Description Modifier Quantity [id number] G0277-(Facility Use Only) HBOT full body chamber, 68mn , 4 ICD-10 Diagnosis Description 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 Personal history of irradiation C61 Malignant neoplasm of prostate Physician Procedures Quantity CPT4 Code Description Modifier [id number] 44920- WC PHYS HYPERBARIC OXYGEN THERAPY 1 ICD-10 Diagnosis Description 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 Personal history of irradiation C61 Malignant neoplasm of prostate Electronic Signature(s) Signed: 04/18/2022 4:44:23 PM By: [name] , , Signed: 04/18/2022 4:44:47 PM By: CFredirick MaudlinMD FACS Entered By: SDonavan Burneton 04/18/2022 16:44:23

## 2022-04-19 ENCOUNTER — Ambulatory Visit: Payer: Medicare Other

## 2022-04-19 ENCOUNTER — Encounter (HOSPITAL_BASED_OUTPATIENT_CLINIC_OR_DEPARTMENT_OTHER): Payer: Medicare Other | Admitting: Internal Medicine

## 2022-04-19 ENCOUNTER — Encounter (HOSPITAL_COMMUNITY): Payer: Medicare Other

## 2022-04-19 DIAGNOSIS — Z923 Personal history of irradiation: Secondary | ICD-10-CM

## 2022-04-19 DIAGNOSIS — N3041 Irradiation cystitis with hematuria: Secondary | ICD-10-CM | POA: Diagnosis not present

## 2022-04-19 DIAGNOSIS — C61 Malignant neoplasm of prostate: Secondary | ICD-10-CM

## 2022-04-19 NOTE — Progress Notes (Signed)
LARSON, LIMONES ([id number]) [id number] Page 1 of 1 Visit Report for 04/19/2022 Arrival Information Details Patient Name: Date of Service: Nathan [name] [name] 04/19/2022 1:00 PM Medical Record Number: [id number] Patient Account Number: [id number] Date of Birth/Sex: Treating RN: 1946-03-25 (76 y.o. [name] Primary Care Evertt Chouinard: [name] Other Clinician: [name] Referring Rashida Ladouceur: Treating Dillie Burandt/Extender: [name] in Treatment: 5 Visit Information History Since Last Visit All ordered tests and consults were completed: Yes Patient Arrived: Ambulatory Added or deleted any medications: No Arrival Time: 12:34 Any new allergies or adverse reactions: No Accompanied By: self Had a fall or experienced change in No Transfer Assistance: None activities of daily living that may affect Patient Identification Verified: Yes risk of falls: Secondary Verification Process Completed: Yes Signs or symptoms of abuse/neglect since last visito No Patient Requires Transmission-Based Precautions: No Hospitalized since last visit: No Patient Has Alerts: No Implantable device outside of the clinic excluding No cellular tissue based products placed in the center since last visit: Pain Present Now: No Electronic Signature(s) Signed: 04/19/2022 4:27:10 PM By: [name] CHT EMT BS , , Entered By: [name] on 04/19/2022 16:27:10 -------------------------------------------------------------------------------- Vitals Details Patient Name: Date of Service: Nathan [name], [name] 04/19/2022 1:00 PM Medical Record Number: [id number] Patient Account Number: [id number] Date of Birth/Sex: Treating RN: 1946/01/22 (76 y.o. [name] Primary Care Trafton Roker: [name] Other Clinician: [name] Referring Male Iafrate: Treating Onur Mori/Extender: [name] in  Treatment: 5 Vital Signs Time Taken: 13:20 Temperature (F): 97.7 Height (in): 73 Pulse (bpm): 68 Weight (lbs): 183 Respiratory Rate (breaths/min): 18 Body Mass Index (BMI): 24.1 Blood Pressure (mmHg): 151/89 Reference Range: 80 - 120 mg / dl Electronic Signature(s) Signed: 04/19/2022 4:27:34 PM By: [name] CHT EMT BS , , Entered By: [name] on 04/19/2022 16:27:33

## 2022-04-19 NOTE — Progress Notes (Addendum)
IZACK, HOOGLAND ([id number]) [id number] Page 1 of 2 Visit Report for 04/19/2022 HBO Details Patient Name: Date of Service: Nathan [name] [name] 04/19/2022 1:00 PM Medical Record Number: [id number] Patient Account Number: [id number] Date of Birth/Sex: Treating RN: 1946-05-17 (76 y.o. 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 HBO Treatment Details Treatment Number: 24 Patient Type: Outpatient Chamber Type: Monoplace Chamber Serial #: [id number] Treatment Protocol: 2.0 ATA with 90 minutes oxygen, and no air breaks Treatment Details Compression Rate Down: 1.5 psi / minute De-Compression Rate Up: 1.5 psi / minute Air breaks and breathing Decompress Decompress Compress Tx Pressure Begins Reached periods Begins Ends (leave unused spaces blank) Chamber Pressure (ATA 1 2 ------2 1 ) Clock Time (24 hr) 12:53 13:07 - - - - - - 14:37 14:47 Treatment Length: 114 (minutes) Treatment Segments: 4 Vital Signs Capillary Blood Glucose Reference Range: 80 - 120 mg / dl HBO Diabetic Blood Glucose Intervention Range: <131 mg/dl or >249 mg/dl Type: Time Vitals Blood Respiratory Capillary Blood Glucose Pulse Action Pulse: Temperature: Taken: Pressure: Rate: Glucose (mg/dl): Meter #: Oximetry (%) Taken: Pre 13:20 151/89 68 18 97.7 none per protocol Post 14:55 157/80 66 18 97.3 none per protocol Treatment Response Treatment Toleration: Well Treatment Completion Status: Treatment Completed without Adverse Event Physician HBO Attestation: I certify that I supervised this HBO treatment in accordance with  Medicare guidelines. A trained emergency response team is readily available per Yes hospital policies and procedures. Continue HBOT as ordered. Yes Electronic Signature(s) Signed: 04/20/2022 11:55:19 AM By: Kalman Shan DO Previous Signature: 04/19/2022 4:46:46 PM Version By: [name] CHT EMT BS , , Entered By: Kalman Shan on 04/20/2022 11:53:59 [name] ([id number]) [id number] Page 2 of 2 -------------------------------------------------------------------------------- HBO Safety Checklist Details Patient Name: Date of Service: Nathan [name] [name] 04/19/2022 1:00 PM Medical Record Number: [id number] Patient Account Number: [id number] Date of Birth/Sex: Treating RN: 1945-12-28 (76 y.o. [name] Primary Care Breslin Burklow: [name] Other Clinician: [name] Referring Darlis Wragg: Treating Yulonda Wheeling/Extender: [name] in Treatment: 5 HBO Safety Checklist Items Safety Checklist Consent Form Signed Patient voided / foley secured and emptied When did you last eato 0930 Last dose of injectable or oral agent n/a Ostomy pouch emptied and vented if applicable NA All implantable devices assessed, documented and approved NA Intravenous access site secured and place NA Valuables secured Linens and cotton and cotton/polyester blend (less than 51% polyester) Personal oil-based products / skin lotions / body lotions removed Wigs or hairpieces removed NA Smoking or tobacco materials removed NA Books / newspapers / magazines / loose paper removed Cologne, aftershave, perfume and deodorant removed Jewelry removed (may wrap wedding band) Make-up removed Hair care products removed Battery operated devices (external) removed NA Heating patches and chemical warmers removed Titanium eyewear removed Nail polish cured greater than 10 hours NA Casting material cured greater than 10  hours NA Hearing aids removed NA Loose dentures or partials removed dentures removed Prosthetics have been removed NA Patient demonstrates correct use of air break device (if applicable) Patient concerns have been addressed Patient grounding bracelet on and cord attached to chamber Specifics for Inpatients (complete in addition to above) Medication sheet sent with patient NA Intravenous medications  needed or due during therapy sent with patient NA Drainage tubes (e.g. nasogastric tube or chest tube secured and vented) NA Endotracheal or Tracheotomy tube secured NA Cuff deflated of air and inflated with saline NA Airway suctioned NA Notes Paper version used prior to treatment. Electronic Signature(s) Signed: 04/19/2022 4:29:15 PM By: [name] CHT EMT BS , , Entered By: [name] on 04/19/2022 16:29:15

## 2022-04-20 ENCOUNTER — Encounter (HOSPITAL_BASED_OUTPATIENT_CLINIC_OR_DEPARTMENT_OTHER): Payer: Medicare Other | Admitting: Internal Medicine

## 2022-04-20 DIAGNOSIS — N3041 Irradiation cystitis with hematuria: Secondary | ICD-10-CM | POA: Diagnosis not present

## 2022-04-20 NOTE — Progress Notes (Signed)
SUMIT, BRANHAM ([id number]) [id number] Page 1 of 1 Visit Report for 04/20/2022 SuperBill Details Patient Name: Date of Service: Nathan [name] [name] 04/20/2022 Medical Record Number: [id number] Patient Account Number: [id number] Date of Birth/Sex: Treating RN: Jun 25, 1946 (76 y.o. 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

## 2022-04-20 NOTE — Progress Notes (Signed)
HABIB, KISE ([id number]) [id number] Page 1 of 2 Visit Report for 04/12/2022 Arrival Information Details Patient Name: Date of Service: Nathan [name] [name] 04/12/2022 1:00 PM Medical Record Number: [id number] Patient Account Number: [id number] Date of Birth/Sex: Treating RN: 10/16/1945 (76 y.o. [name], [name] Primary Care Deny Chevez: [name] Other Clinician: [name] Referring Ezrah Dembeck: Treating Toshie Demelo/Extender: [name] in Treatment: 4 Visit Information History Since Last Visit All ordered tests and consults were completed: Yes Patient Arrived: [name] Added or deleted any medications: No Arrival Time: 12:17 Any new allergies or adverse reactions: No Accompanied By: self Had a fall or experienced change in No Transfer Assistance: None activities of daily living that may affect Patient Identification Verified: Yes risk of falls: Secondary Verification Process Completed: Yes Signs or symptoms of abuse/neglect since last visito No Patient Requires Transmission-Based Precautions: No Hospitalized since last visit: No Patient Has Alerts: No Implantable device outside of the clinic excluding No cellular tissue based products placed in the center since last visit: Pain Present Now: No Electronic Signature(s) Signed: 04/19/2022 6:23:04 PM By: [name] CHT EMT BS , , Entered By: [name] on 04/12/2022 15:45:24 -------------------------------------------------------------------------------- Encounter Discharge Information Details Patient Name: Date of Service: [name], [name] 04/12/2022 1:00 PM Medical Record Number: [id number] Patient Account Number: [id number] Date of Birth/Sex: Treating RN: 1946-05-03 (76 y.o. [name], [name] Primary Care Emili Mcloughlin: [name] Other Clinician: [name] Referring Callee Rohrig: Treating Freida Nebel/Extender: [name] in Treatment: 4 Encounter Discharge Information Items Discharge Condition: Stable Ambulatory Status: Cane Discharge Destination: Home Transportation: Private Auto Accompanied By: self Schedule Follow-up Appointment: No Clinical Summary of Care: Electronic Signature(s) Signed: 04/19/2022 6:23:04 PM By: [name] CHT EMT BS , , Entered By: [name] on 04/12/2022 15:50:04 [name] ([id number]) [id number] Page 2 of 2 -------------------------------------------------------------------------------- Vitals Details Patient Name: Date of Service: Nathan [name] [name] 04/12/2022 1:00 PM Medical Record Number: [id number] Patient Account Number: [id number] Date of Birth/Sex: Treating RN: 1946-01-30 (76 y.o. [name], [name] Primary Care Ramal Eckhardt: [name] Other Clinician: [name] Referring Daniel Johndrow: Treating Donyae Kilner/Extender: [name] in Treatment: 4 Vital Signs Time Taken: 12:42 Temperature (F): 97.9 Height (in): 73 Pulse (bpm): 60 Weight (lbs): 183 Respiratory Rate (breaths/min): 18 Body Mass Index (BMI): 24.1 Blood Pressure (mmHg): 164/87 Reference Range: 80 - 120 mg / dl Airway Pulse Oximetry (%): 99 Electronic Signature(s) Signed: 04/19/2022 6:23:04 PM By: [name] CHT EMT BS , , Entered By: [name] on 04/12/2022 15:46:01

## 2022-04-20 NOTE — Progress Notes (Signed)
Nathan, [name] ([id number]) [id number] Page 1 of 2 Visit Report for 04/20/2022 Arrival Information Details Patient Name: Date of Service: Nathan [name] Nathan [name] 04/20/2022 1:00 PM Medical Record Number: [id number] Patient Account Number: [id number] Date of Birth/Sex: Treating RN: 10-Dec-1945 (76 y.o. Nathan [name] Primary Care Cleotha Whalin: Nathan [name] Other Clinician: [name] Referring Nathan [name]: Treating Nathan [name]/Extender: Nathan [name] in Treatment: 5 Visit Information History Since Last Visit All ordered tests and consults were completed: Yes Patient Arrived: Nathan [name] Added or deleted any medications: No Arrival Time: 12:32 Any new allergies or adverse reactions: No Accompanied By: self Had a fall or experienced change in No Transfer Assistance: None activities of daily living that may affect Patient Identification Verified: Yes risk of falls: Secondary Verification Process Completed: Yes Signs or symptoms of abuse/neglect since last visito No Patient Requires Transmission-Based Precautions: No Hospitalized since last visit: No Patient Has Alerts: No Implantable device outside of the clinic excluding No cellular tissue based products placed in the center since last visit: Pain Present Now: No Electronic Signature(s) Signed: 04/20/2022 3:46:17 PM By: Nathan [name] CHT EMT BS , , Entered By: Nathan [name] on 04/20/2022 15:46:17 -------------------------------------------------------------------------------- Encounter Discharge Information Details Patient Name: Date of Service: Nathan [name], [name] Nathan [name] 04/20/2022 1:00 PM Medical Record Number: [id number] Patient Account Number: [id number] Date of Birth/Sex: Treating RN: April 20, 1946 (76 y.o. Nathan [name] Primary Care Kemari Mares: Nathan [name] Other Clinician: [name] Referring Carrington Mullenax: Treating Fatoumata Albaugh/Extender: Nathan [name] in Treatment: 5 Encounter Discharge Information Items Discharge Condition: Stable Ambulatory Status: Cane Discharge Destination: Home Transportation: Private Auto Accompanied By: self Schedule Follow-up Appointment: No Clinical Summary of Care: Electronic Signature(s) Signed: 04/20/2022 3:50:05 PM By: Nathan [name] CHT EMT BS , , Entered By: Nathan [name] on 04/20/2022 15:50:05 Nathan [name] ([id number]) [id number] Page 2 of 2 -------------------------------------------------------------------------------- Vitals Details Patient Name: Date of Service: Nathan [name] Nathan [name] 04/20/2022 1:00 PM Medical Record Number: [id number] Patient Account Number: [id number] Date of Birth/Sex: Treating RN: 01/15/1946 (76 y.o. Nathan [name] Primary Care Sadae Arrazola: Nathan [name] Other Clinician: [name] Referring Holt Woolbright: Treating Ercell Perlman/Extender: Nathan [name] in Treatment: 5 Vital Signs Time Taken: 12:51 Temperature (F): 97.4 Height (in): 73 Pulse (bpm): 73 Weight (lbs): 183 Respiratory Rate (breaths/min): 20 Body Mass Index (BMI): 24.1 Blood Pressure (mmHg): 128/78 Reference Range: 80 - 120 mg / dl Electronic Signature(s) Signed: 04/20/2022 3:46:42 PM By: Nathan [name] CHT EMT BS , , Entered By: Nathan [name] on 04/20/2022 15:46:42

## 2022-04-20 NOTE — Progress Notes (Addendum)
[name], Nathan ([id number]) [id number] Page 1 of 2 Visit Report for 04/20/2022 HBO Details Patient Name: Date of Service: Nathan [name] [name] 04/20/2022 1:00 PM Medical Record Number: [id number] Patient Account Number: [id number] Date of Birth/Sex: Treating RN: [birth year] (76 y.o. 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 HBO Treatment Details Treatment Number: 25 Patient Type: Outpatient Chamber Type: Monoplace Chamber Serial #: [id number] Treatment Protocol: 2.0 ATA with 90 minutes oxygen, and no air breaks Treatment Details Compression Rate Down: 2.0 psi / minute De-Compression Rate Up: 2.0 psi / minute Air breaks and breathing Decompress Decompress Compress Tx Pressure Begins Reached periods Begins Ends (leave unused spaces blank) Chamber Pressure (ATA 1 2 ------2 1 ) Clock Time (24 hr) 12:55 13:03 - - - - - - 14:33 14:43 Treatment Length: 108 (minutes) Treatment Segments: 4 Vital Signs Capillary Blood Glucose Reference Range: 80 - 120 mg / dl HBO Diabetic Blood Glucose Intervention Range: <131 mg/dl or >249 mg/dl Type: Time Vitals Blood Respiratory Capillary Blood Glucose Pulse Action Pulse: Temperature: Taken: Pressure: Rate: Glucose (mg/dl): Meter #: Oximetry (%) Taken: Pre 12:51 128/78 73 20 97.4 none per protocol Post 14:45 144/83 70 16 97.4 none per protocol Treatment Response Treatment Toleration: Well Treatment Completion Status: Treatment Completed without Adverse Event Tocarra Gassen Notes No concerns with treatment given. Patient was also seen for monthly  evaluation Physician HBO Attestation: I certify that I supervised this HBO treatment in accordance with Medicare guidelines. A trained emergency response team is readily available per Yes hospital policies and procedures. Continue HBOT as ordered. Yes Electronic Signature(s) Signed: 04/20/2022 4:47:23 PM By: Linton Ham MD Previous Signature: 04/20/2022 3:49:13 PM Version By: [name] CHT EMT BS , , Entered By: Linton Ham on 04/20/2022 16:42:59 [name] ([id number]) [id number] Page 2 of 2 -------------------------------------------------------------------------------- HBO Safety Checklist Details Patient Name: Date of Service: Nathan [name] [name] 04/20/2022 1:00 PM Medical Record Number: [id number] Patient Account Number: [id number] Date of Birth/Sex: Treating RN: 11/06/1945 (76 y.o. Nathan [name] Primary Care Priyanka Causey: [name] Other Clinician: [name] Referring Kaysen Deal: Treating Emilly Lavey/Extender: [name] in Treatment: 5 HBO Safety Checklist Items Safety Checklist Consent Form Signed Patient voided / foley secured and emptied When did you last eato 1000 Last dose of injectable or oral agent n/a Ostomy pouch emptied and vented if applicable NA All implantable devices assessed, documented and approved NA Intravenous access site secured and place NA Valuables secured Linens and cotton and cotton/polyester blend (less than 51% polyester) Personal oil-based products / skin lotions / body lotions removed Wigs or hairpieces removed NA Smoking or tobacco materials removed NA Books / newspapers / magazines / loose paper removed Cologne, aftershave, perfume and deodorant removed Jewelry removed (may wrap wedding band) Make-up removed NA Hair care products removed Battery operated devices (external) removed Heating patches and chemical warmers removed Titanium eyewear  removed NA Nail polish cured greater than 10 hours NA Casting material cured greater than 10 hours NA Hearing aids removed NA Loose dentures or partials removed dentures removed Prosthetics have been removed NA Patient demonstrates correct use of air break device (if applicable) Patient concerns have been addressed Patient grounding bracelet on and cord attached to chamber Specifics  for Inpatients (complete in addition to above) Medication sheet sent with patient NA Intravenous medications needed or due during therapy sent with patient NA Drainage tubes (e.g. nasogastric tube or chest tube secured and vented) NA Endotracheal or Tracheotomy tube secured NA Cuff deflated of air and inflated with saline NA Airway suctioned NA Notes Paper version used prior to treatment. Electronic Signature(s) Signed: 04/20/2022 3:47:48 PM By: [name] CHT EMT BS , , Entered By: [name] on 04/20/2022 15:47:48

## 2022-04-20 NOTE — Progress Notes (Signed)
CLARENCE, COGSWELL ([id number]) [id number] Page 1 of 1 Visit Report for 04/19/2022 SuperBill Details Patient Name: Date of Service: Nathan [name] [name] 04/19/2022 Medical Record Number: [id number] Patient Account Number: [id number] Date of Birth/Sex: Treating RN: 1946/02/21 (76 y.o. 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

## 2022-04-20 NOTE — Progress Notes (Addendum)
Nathan, [name] ([id number]) [id number] Page 1 of 5 Visit Report for 04/20/2022 Arrival Information Details Patient Name: Date of Service: Nathan [name] [name] 04/20/2022 3:00 PM Medical Record Number: [id number] Patient Account Number: [id number] Date of Birth/Sex: Treating RN: 05-07-1946 (76 y.o. [name] Primary Care Nelma Phagan: [name] Other Clinician: Referring Calib Wadhwa: Treating Dashay Giesler/Extender: Erskine Emery, Penelope Galas in Treatment: 5 Visit Information History Since Last Visit Added or deleted any medications: No Patient Arrived: [name] Any new allergies or adverse reactions: No Arrival Time: 15:08 Had a fall or experienced change in No Accompanied By: self activities of daily living that may affect Transfer Assistance: None risk of falls: Patient Identification Verified: Yes Signs or symptoms of abuse/neglect since last visito No Secondary Verification Process Completed: Yes Hospitalized since last visit: No Patient Requires Transmission-Based Precautions: No Implantable device outside of the clinic excluding No Patient Has Alerts: No cellular tissue based products placed in the center since last visit: Pain Present Now: No Electronic Signature(s) Signed: 04/23/2022 11:03:23 AM By: Maye Hides Previous Signature: 04/20/2022 3:35:50 PM Version By: Adline Peals Entered By: Maye Hides on 04/23/2022 11:03:23 -------------------------------------------------------------------------------- Clinic Level of Care Assessment Details Patient Name: Date of Service: Nathan [name] [name] 04/20/2022 3:00 PM Medical Record Number: [id number] Patient Account Number: [id number] Date of Birth/Sex: Treating RN: 02/17/1946 (76 y.o. [name] Primary Care Asyah Candler: [name] Other Clinician: Referring Cristen Murcia: Treating Phyllip Claw/Extender: [name] in Treatment: 5 Clinic  Level of Care Assessment Items TOOL 4 Quantity Score X- 1 0 Use when only an EandM is performed on FOLLOW-UP visit ASSESSMENTS - Nursing Assessment / Reassessment X- 1 10 Reassessment of Co-morbidities (includes updates in patient status) X- 1 5 Reassessment of Adherence to Treatment Plan ASSESSMENTS - Wound and Skin A ssessment / Reassessment '[]'$  - 0 Simple Wound Assessment / Reassessment - one wound '[]'$  - 0 Complex Wound Assessment / Reassessment - multiple wounds '[]'$  - 0 Dermatologic / Skin Assessment (not related to wound area) ASSESSMENTS - Focused Assessment '[]'$  - 0 Circumferential Edema Measurements - multi extremities '[]'$  - 0 Nutritional Assessment / Counseling / Intervention '[]'$  - 0 Lower Extremity Assessment (monofilament, tuning fork, pulses) Nathan, [name] ([id number]) [id number] Page 2 of 5 '[]'$  - 0 Peripheral Arterial Disease Assessment (using hand held doppler) ASSESSMENTS - Ostomy and/or Continence Assessment and Care '[]'$  - 0 Incontinence Assessment and Management '[]'$  - 0 Ostomy Care Assessment and Management (repouching, etc.) PROCESS - Coordination of Care X - Simple Patient / Family Education for ongoing care 1 15 '[]'$  - 0 Complex (extensive) Patient / Family Education for ongoing care X- 1 10 Staff obtains Programmer, systems, Records, T Results / Process Orders est '[]'$  - 0 Staff telephones HHA, Nursing Homes / Clarify orders / etc '[]'$  - 0 Routine Transfer to another Facility (non-emergent condition) '[]'$  - 0 Routine Hospital Admission (non-emergent condition) '[]'$  - 0 New Admissions / Biomedical engineer / Ordering NPWT Apligraf, etc. , '[]'$  - 0 Emergency Hospital Admission (emergent condition) X- 1 10 Simple Discharge Coordination '[]'$  - 0 Complex (extensive) Discharge Coordination PROCESS - Special Needs '[]'$  - 0 Pediatric / Minor Patient Management '[]'$  - 0 Isolation Patient Management '[]'$  - 0 Hearing / Language / Visual special needs '[]'$  -  0 Assessment of Community assistance (transportation, D/C planning, etc.) '[]'$  - 0 Additional assistance / Altered mentation '[]'$  - 0 Support Surface(s) Assessment (bed, cushion, seat, etc.) INTERVENTIONS - Wound Cleansing / Measurement '[]'$  - 0 Simple  Wound Cleansing - one wound '[]'$  - 0 Complex Wound Cleansing - multiple wounds '[]'$  - 0 Wound Imaging (photographs - any number of wounds) '[]'$  - 0 Wound Tracing (instead of photographs) '[]'$  - 0 Simple Wound Measurement - one wound '[]'$  - 0 Complex Wound Measurement - multiple wounds INTERVENTIONS - Wound Dressings '[]'$  - 0 Small Wound Dressing one or multiple wounds '[]'$  - 0 Medium Wound Dressing one or multiple wounds '[]'$  - 0 Large Wound Dressing one or multiple wounds '[]'$  - 0 Application of Medications - topical '[]'$  - 0 Application of Medications - injection INTERVENTIONS - Miscellaneous '[]'$  - 0 External ear exam '[]'$  - 0 Specimen Collection (cultures, biopsies, blood, body fluids, etc.) '[]'$  - 0 Specimen(s) / Culture(s) sent or taken to Lab for analysis '[]'$  - 0 Patient Transfer (multiple staff / Civil Service fast streamer / Similar devices) '[]'$  - 0 Simple Staple / Suture removal (25 or less) '[]'$  - 0 Complex Staple / Suture removal (26 or more) '[]'$  - 0 Hypo / Hyperglycemic Management (close monitor of Blood Glucose) '[]'$  - 0 Ankle / Brachial Index (ABI) - do not check if billed separately [name], Nathan ([id number]) [id number] Page 3 of 5 X- 1 5 Vital Signs Has the patient been seen at the hospital within the last three years: Yes Total Score: 55 Level Of Care: New/Established - Level 2 Electronic Signature(s) Signed: 05/21/2022 10:38:50 AM By: Maye Hides Previous Signature: 04/20/2022 3:35:50 PM Version By: Adline Peals Entered By: Maye Hides on 04/23/2022 11:04:58 -------------------------------------------------------------------------------- Encounter Discharge Information Details Patient Name: Date of  Service: Nathan [name] [name] 04/20/2022 3:00 PM Medical Record Number: [id number] Patient Account Number: [id number] Date of Birth/Sex: Treating RN: 1945-10-14 (76 y.o. [name] Primary Care Oswin Griffith: [name] Other Clinician: Referring Girard Koontz: Treating Maurica Omura/Extender: Erskine Emery, Penelope Galas in Treatment: 5 Encounter Discharge Information Items Discharge Condition: Stable Ambulatory Status: Cane Discharge Destination: Home Transportation: Private Auto Accompanied By: self Schedule Follow-up Appointment: Yes Clinical Summary of Care: Patient Declined Electronic Signature(s) Signed: 04/23/2022 11:05:41 AM By: Maye Hides Previous Signature: 04/20/2022 3:35:50 PM Version By: Adline Peals Entered By: Maye Hides on 04/23/2022 11:05:41 -------------------------------------------------------------------------------- Lower Extremity Assessment Details Patient Name: Date of Service: Nathan [name] [name] 04/20/2022 3:00 PM Medical Record Number: [id number] Patient Account Number: [id number] Date of Birth/Sex: Treating RN: 05/19/1946 (76 y.o. [name] Primary Care Alaija Ruble: [name] Other Clinician: Referring Ilia Engelbert: Treating Terita Hejl/Extender: Erskine Emery, Penelope Galas in Treatment: 5 Electronic Signature(s) Signed: 04/23/2022 11:03:50 AM By: Maye Hides Signed: 04/23/2022 4:32:28 PM By: Adline Peals Previous Signature: 04/20/2022 3:35:50 PM Version By: Adline Peals Entered By: Maye Hides on 04/23/2022 11:03:49 Multi-Disciplinary Care Plan Details -------------------------------------------------------------------------------- [name] ([id number]) [id number] Page 4 of 5 Patient Name: Date of Service: Nathan [name] [name] 04/20/2022 3:00 PM Medical Record Number: [id number] Patient Account Number: [id number] Date of Birth/Sex: Treating RN: July 08, 1945  (76 y.o. 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 and treatment procedures Goals: Patient will tolerate the hyperbaric oxygen therapy treatment Date Initiated: 03/14/2022 Target Resolution Date: 06/01/2022 Goal Status: Active Interventions: Administer decongestants, per physician orders, prior to HBO2 Notes: Electronic Signature(s) Signed: 04/23/2022 11:04:22 AM By: Maye Hides Signed: 04/23/2022 4:32:28 PM By: Adline Peals Previous Signature: 04/20/2022 3:35:50 PM Version By: Adline Peals Entered By: Maye Hides  on 04/23/2022 11:04:22 -------------------------------------------------------------------------------- Pain Assessment Details Patient Name: Date of Service: Nathan [name] [name] 04/20/2022 3:00 PM Medical Record Number: [id number] Patient Account Number: [id number] Date of Birth/Sex: Treating RN: 11-27-1945 (76 y.o. [name] Primary Care Kyana Aicher: [name] Other Clinician: Referring Konnar Ben: Treating Annmargaret Decaprio/Extender: [name] in Treatment: 5 Active Problems Location of Pain Severity and Description of Pain Patient Has Paino No Site Locations Rate the pain. Current Pain Level: 0 Pain Management and Medication Current Pain Management: ARMONI, DEPASS ([id number]) [id number] Page 5 of 5 Electronic Signature(s) Signed: 04/23/2022 11:03:41 AM By: Maye Hides Signed: 04/23/2022 4:32:28 PM By: Adline Peals Previous Signature: 04/20/2022 3:35:50 PM Version By: Sabas Sous By: Maye Hides on 04/23/2022  11:03:41 -------------------------------------------------------------------------------- Patient/Caregiver Education Details Patient Name: Date of Service: Nathan [name] [name] 10/20/2023andnbsp3:00 PM Medical Record Number: [id number] Patient Account Number: [id number] Date of Birth/Gender: Treating RN: 1945-11-27 (76 y.o. [name] Primary Care Physician: [name] Other Clinician: Referring Physician: Treating Physician/Extender: [name] in Treatment: 5 Education Assessment Education Provided To: Patient Education Topics Provided Safety: Methods: Explain/Verbal Responses: Reinforcements needed, State content correctly Electronic Signature(s) Signed: 05/21/2022 10:38:50 AM By: Maye Hides Previous Signature: 04/20/2022 3:35:50 PM Version By: Adline Peals Entered By: Maye Hides on 04/23/2022 11:04:40 -------------------------------------------------------------------------------- Vitals Details Patient Name: Date of Service: [name], [name] [name] 04/20/2022 3:00 PM Medical Record Number: [id number] Patient Account Number: [id number] Date of Birth/Sex: Treating RN: 03/21/1946 (76 y.o. [name] Primary Care Alison Kubicki: [name] Other Clinician: Referring Addalee Kavanagh: Treating Omayra Tulloch/Extender: [name] in Treatment: 5 Vital Signs Time Taken: 14:45 Temperature (F): 97.4 Height (in): 73 Pulse (bpm): 70 Weight (lbs): 183 Respiratory Rate (breaths/min): 16 Body Mass Index (BMI): 24.1 Blood Pressure (mmHg): 144/83 Reference Range: 80 - 120 mg / dl Electronic Signature(s) Signed: 04/23/2022 11:03:32 AM By: Maye Hides Previous Signature: 04/20/2022 3:35:50 PM Version By: Adline Peals Entered By: Maye Hides on 04/23/2022 11:03:32

## 2022-04-20 NOTE — Progress Notes (Signed)
JAYCEN, VERCHER ([id number]) [id number] Page 1 of 2 Visit Report for 04/12/2022 HBO Details Patient Name: Date of Service: Nathan [name] [name] 04/12/2022 1:00 PM Medical Record Number: [id number] Patient Account Number: [id number] Date of Birth/Sex: Treating RN: 1945-11-12 (76 y.o. 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 HBO Treatment Details Treatment Number: 19 Patient Type: Outpatient Chamber Type: Monoplace Chamber Serial #: [id number] Treatment Protocol: 2.0 ATA with 90 minutes oxygen, and no air breaks Treatment Details Compression Rate Down: 1.5 psi / minute De-Compression Rate Up: 2.0 psi / minute Air breaks and breathing Decompress Decompress Compress Tx Pressure Begins Reached periods Begins Ends (leave unused spaces blank) Chamber Pressure (ATA 1 2 ------2 1 ) Clock Time (24 hr) 12:50 13:01 - - - - - - 14:31 14:39 Treatment Length: 109 (minutes) Treatment Segments: 4 Vital Signs Capillary Blood Glucose Reference Range: 80 - 120 mg / dl HBO Diabetic Blood Glucose Intervention Range: <131 mg/dl or >249 mg/dl Type: Time Vitals Blood Respiratory Capillary Blood Glucose Pulse Action Pulse: Temperature: Taken: Pressure: Rate: Glucose (mg/dl): Meter #: Oximetry (%) Taken: Pre 12:42 164/87 60 18 97.9 99 none per protocol Post 14:42 154/87 Treatment Response Treatment Toleration: Well Treatment Completion Status: Treatment Completed without Adverse Event Treatment Notes Pre treatment vitals. Patient requested Pulse Oximetry. Once applied, heart rate was read at 143 bpm. I  performed a manual pulse check which required a full minute as patient's pulse was varied fast and slow. Full 1 minute heart rate was 60 bpm. Patient was safely placed in the chamber and as the chamber was compressed I varied the rate set starting at 1 psi/min until reaching 5 psig at which time rate set was increased to 1.5 psi/min until reaching 10 psig. At 10 psig rate set was increased again to 2 psi/min until reaching 2 ATA, Patient tolerated treatment well. During decompression chamber rate set was 2 psi/min. Post treatment vitals were with in established limits. Physician HBO Attestation: I certify that I supervised this HBO treatment in accordance with Medicare guidelines. A trained emergency response team is readily available per Yes hospital policies and procedures. Continue HBOT as ordered. Yes Electronic Signature(s) Signed: 04/12/2022 4:13:57 PM By: [name] MD FACS Entered By: [name] on 04/12/2022 16:13:57 [name] ([id number]) [id number] Page 2 of 2 -------------------------------------------------------------------------------- HBO Safety Checklist Details Patient Name: Date of Service: Nathan [name] [name] 04/12/2022 1:00 PM Medical Record Number: [id number] Patient Account Number: [id number] Date of Birth/Sex: Treating RN: 1946-03-23 (76 y.o. [name], [name] Primary Care Gwendy Boeder: [name] Other Clinician: [name] Referring Aalani Aikens: Treating Ellysia Char/Extender: [name] in Treatment: 4 HBO Safety Checklist Items Safety Checklist Consent Form Signed Patient voided / foley secured and emptied When did you last eato 0930 Last dose of injectable or oral agent Ostomy pouch emptied and vented if applicable NA All implantable devices assessed, documented and approved NA Intravenous access site secured and place NA Valuables secured Linens and cotton and cotton/polyester  blend (less than 51% polyester) Personal oil-based products / skin lotions / body lotions removed Wigs or hairpieces removed NA Smoking or tobacco materials removed NA Books / newspapers / magazines / loose paper removed Cologne, aftershave, perfume and  deodorant removed Jewelry removed (may wrap wedding band) Make-up removed NA Hair care products removed Battery operated devices (external) removed Heating patches and chemical warmers removed Titanium eyewear removed Nail polish cured greater than 10 hours NA Casting material cured greater than 10 hours NA Hearing aids removed NA Loose dentures or partials removed dentures removed Prosthetics have been removed NA Patient demonstrates correct use of air break device (if applicable) Patient concerns have been addressed Patient grounding bracelet on and cord attached to chamber Specifics for Inpatients (complete in addition to above) Medication sheet sent with patient NA Intravenous medications needed or due during therapy sent with patient NA Drainage tubes (e.g. nasogastric tube or chest tube secured and vented) NA Endotracheal or Tracheotomy tube secured NA Cuff deflated of air and inflated with saline NA Airway suctioned NA Notes Paper version used prior to treatment start. Electronic Signature(s) Signed: 04/19/2022 6:23:04 PM By: [name] CHT EMT BS , , Entered By: [name] on 04/12/2022 15:46:53

## 2022-04-20 NOTE — Progress Notes (Signed)
ABASS, MISENER ([id number]) [id number] Page 1 of 1 Visit Report for 04/12/2022 SuperBill Details Patient Name: Date of Service: Nathan [name] [name] 04/12/2022 Medical Record Number: [id number] Patient Account Number: [id number] Date of Birth/Sex: Treating RN: 02-18-1946 (76 y.o. 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

## 2022-04-20 NOTE — Progress Notes (Signed)
Nathan [name], Nathan [name] ([id number]) [id number] Page 1 of 2 Visit Report for 03/27/2022 HBO Details Patient Name: Date of Service: Nathan [name] 03/27/2022 1:00 PM Medical Record Number: [id number] Patient Account Number: [id number] Date of Birth/Sex: Treating RN: [birth year] (76 y.o. 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 HBO Treatment Details Treatment Number: 7 Patient Type: Outpatient Chamber Type: Monoplace Chamber Serial #: [id number] Treatment Protocol: 2.0 ATA with 90 minutes oxygen, and no air breaks Treatment Details Compression Rate Down: 1.5 psi / minute De-Compression Rate Up: 1.5 psi / minute Air breaks and breathing Decompress Decompress Compress Tx Pressure Begins Reached periods Begins Ends (leave unused spaces blank) Chamber Pressure (ATA 1 2 ------2 1 ) Clock Time (24 hr) 12:32 12:44 - - - - - - 14:14 14:24 Treatment Length: 112 (minutes) Treatment Segments: 4 Vital Signs Capillary Blood Glucose Reference Range: 80 - 120 mg / dl HBO Diabetic Blood Glucose Intervention Range: <131 mg/dl or >249 mg/dl Type: Time Vitals Blood Respiratory Capillary Blood Glucose Pulse Action Pulse: Temperature: Taken: Pressure: Rate: Glucose (mg/dl): Meter #: Oximetry (%) Taken: Pre 12:26 129/75 88 18 97.8 none per protocol Post 14:32 154/74 68 18 98.1 none per protocol Treatment Response Treatment Toleration: Well Treatment Completion Status: Treatment Completed without Adverse Event Physician HBO Attestation: I certify that I supervised this HBO treatment in accordance with  Medicare guidelines. A trained emergency response team is readily available per Yes hospital policies and procedures. Continue HBOT as ordered. Yes Electronic Signature(s) Signed: 03/27/2022 4:43:30 PM By: Kalman Shan DO Entered By: Kalman Shan on 03/27/2022 16:42:57 [name] ([id number]) [id number] Page 2 of 2 -------------------------------------------------------------------------------- HBO Safety Checklist Details Patient Name: Date of Service: Nathan [name] 03/27/2022 1:00 PM Medical Record Number: [id number] Patient Account Number: [id number] Date of Birth/Sex: Treating RN: 17-Oct-1945 (76 y.o. [name], [name] Primary Care Kinesha Auten: [name] Other Clinician: [name] Referring Chante Mayson: Treating Raevyn Sokol/Extender: [name] in Treatment: 1 HBO Safety Checklist Items Safety Checklist Consent Form Signed Patient voided / foley secured and emptied When did you last eato 0930 Last dose of injectable or oral agent n/a Ostomy pouch emptied and vented if applicable NA All implantable devices assessed, documented and approved NA Intravenous access site secured and place NA Valuables secured Linens and cotton and cotton/polyester blend (less than 51% polyester) Personal oil-based products / skin lotions / body lotions removed Wigs or hairpieces removed NA Smoking or tobacco materials removed NA Books / newspapers / magazines / loose paper removed Cologne, aftershave, perfume and deodorant removed Jewelry removed (may wrap wedding band) Make-up removed Hair care products removed Battery operated devices (external) removed Heating patches and chemical warmers removed Titanium eyewear removed Nail polish cured greater than 10 hours NA Casting material cured greater than 10 hours NA Hearing aids removed NA Loose dentures or partials removed dentures removed Prosthetics have been  removed NA Patient demonstrates correct use of air break device (if applicable) Patient concerns have been addressed Patient grounding bracelet on and cord attached to chamber Specifics for Inpatients (complete in addition to above) Medication sheet sent with patient NA Intravenous medications needed or due during therapy sent with patient NA Drainage tubes (e.g. nasogastric tube or  chest tube secured and vented) NA Endotracheal or Tracheotomy tube secured NA Cuff deflated of air and inflated with saline NA Airway suctioned NA Notes Paper version used prior to treatment. Electronic Signature(s) Signed: 04/19/2022 6:23:04 PM By: [name] CHT EMT BS , , Entered By: [name] on 03/27/2022 16:39:27

## 2022-04-20 NOTE — Progress Notes (Signed)
DARVELL, MONTEFORTE ([id number]) [id number] Page 1 of 1 Visit Report for 03/27/2022 SuperBill Details Patient Name: Date of Service: Nathan [name] [name] 03/27/2022 Medical Record Number: [id number] Patient Account Number: [id number] Date of Birth/Sex: Treating RN: March 25, 1946 (76 y.o. 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

## 2022-04-20 NOTE — Progress Notes (Signed)
Nathan, [name] ([id number]) [id number] Page 1 of 2 Visit Report for 03/27/2022 Arrival Information Details Patient Name: Date of Service: Nathan [name] 03/27/2022 1:00 PM Medical Record Number: [id number] Patient Account Number: [id number] Date of Birth/Sex: Treating RN: 1945-08-23 (76 y.o. [name], [name] Primary Care Kendrew Paci: [name] Other Clinician: [name] Referring Anastacio Bua: Treating Shiela Bruns/Extender: [name] in Treatment: 1 Visit Information History Since Last Visit All ordered tests and consults were completed: Yes Patient Arrived: [name] Added or deleted any medications: No Arrival Time: 12:15 Any new allergies or adverse reactions: No Accompanied By: self Had a fall or experienced change in No Transfer Assistance: None activities of daily living that may affect Patient Identification Verified: Yes risk of falls: Secondary Verification Process Completed: Yes Signs or symptoms of abuse/neglect since last visito No Patient Requires Transmission-Based Precautions: No Hospitalized since last visit: No Patient Has Alerts: No Implantable device outside of the clinic excluding No cellular tissue based products placed in the center since last visit: Pain Present Now: No Electronic Signature(s) Signed: 04/19/2022 6:23:04 PM By: [name] CHT EMT BS , , Entered By: [name] on 03/27/2022 16:42:10 -------------------------------------------------------------------------------- Encounter Discharge Information Details Patient Name: Date of Service: Nathan [name], Nathan [name] 03/27/2022 1:00 PM Medical Record Number: [id number] Patient Account Number: [id number] Date of Birth/Sex: Treating RN: 05-25-1946 (76 y.o. [name], [name] Primary Care Willo Yoon: [name] Other Clinician: [name] Referring Saniya Tranchina: Treating Anaijah Augsburger/Extender: [name] in Treatment: 1 Encounter Discharge Information Items Discharge Condition: Stable Ambulatory Status: Cane Discharge Destination: Home Transportation: Private Auto Accompanied By: self Schedule Follow-up Appointment: No Clinical Summary of Care: Electronic Signature(s) Signed: 04/19/2022 6:23:04 PM By: [name] CHT EMT BS , , Entered By: [name] on 03/27/2022 [place], Nathan [name] ([id number]) [id number] Page 2 of 2 -------------------------------------------------------------------------------- Vitals Details Patient Name: Date of Service: Nathan [name] 03/27/2022 1:00 PM Medical Record Number: [id number] Patient Account Number: [id number] Date of Birth/Sex: Treating RN: 1946/07/02 (76 y.o. [name], [name] Primary Care Shailen Thielen: [name] Other Clinician: [name] Referring Eustolia Drennen: Treating Kameisha Malicki/Extender: [name] in Treatment: 1 Vital Signs Time Taken: 12:26 Temperature (F): 97.8 Height (in): 73 Pulse (bpm): 88 Weight (lbs): 183 Respiratory Rate (breaths/min): 18 Body Mass Index (BMI): 24.1 Blood Pressure (mmHg): 129/75 Reference Range: 80 - 120 mg / dl Electronic Signature(s) Signed: 04/19/2022 6:23:04 PM By: [name] CHT EMT BS , , Entered By: [name] on 03/27/2022 16:38:01

## 2022-04-23 ENCOUNTER — Encounter (HOSPITAL_BASED_OUTPATIENT_CLINIC_OR_DEPARTMENT_OTHER): Payer: Medicare Other | Admitting: Internal Medicine

## 2022-04-23 DIAGNOSIS — C61 Malignant neoplasm of prostate: Secondary | ICD-10-CM | POA: Diagnosis not present

## 2022-04-23 DIAGNOSIS — Z923 Personal history of irradiation: Secondary | ICD-10-CM

## 2022-04-23 DIAGNOSIS — N3041 Irradiation cystitis with hematuria: Secondary | ICD-10-CM | POA: Diagnosis not present

## 2022-04-23 NOTE — Progress Notes (Signed)
ZESHAN, SENA ([id number]) [id number] Page 1 of 2 Visit Report for 04/23/2022 Arrival Information Details Patient Name: Date of Service: Nathan [name] [name] 04/23/2022 1:00 PM Medical Record Number: [id number] Patient Account Number: [id number] Date of Birth/Sex: Treating RN: 11-Nov-1945 (76 y.o. Nathan [name] Primary Care Nathan [name]: Nathan [name] Other Clinician: Valeria [name] Referring Nathan [name]: Treating Nathan [name]/Extender: Nathan [name] in Treatment: 5 Visit Information History Since Last Visit All ordered tests and consults were completed: Yes Patient Arrived: Ambulatory Added or deleted any medications: No Arrival Time: 12:32 Any new allergies or adverse reactions: No Accompanied By: None Had a fall or experienced change in No Transfer Assistance: None activities of daily living that may affect Patient Identification Verified: Yes risk of falls: Secondary Verification Process Completed: Yes Signs or symptoms of abuse/neglect since last visito No Patient Requires Transmission-Based Precautions: No Hospitalized since last visit: No Patient Has Alerts: No Implantable device outside of the clinic excluding No cellular tissue based products placed in the center since last visit: Pain Present Now: No Electronic Signature(s) Signed: 04/23/2022 4:31:15 PM By: Nathan [name] EMT Entered By: Nathan [name] on 04/23/2022 16:31:14 -------------------------------------------------------------------------------- Encounter Discharge Information Details Patient Name: Date of Service: Nathan [name], Nathan [name] 04/23/2022 1:00 PM Medical Record Number: [id number] Patient Account Number: [id number] Date of Birth/Sex: Treating RN: May 28, 1946 (76 y.o. Nathan [name] Primary Care Ceola Para: Nathan [name] Other Clinician: Valeria [name] Referring Nathan [name]: Treating Nathan [name]/Extender: Nathan [name] in  Treatment: 5 Encounter Discharge Information Items Discharge Condition: Stable Ambulatory Status: Ambulatory Discharge Destination: Home Transportation: Private Auto Accompanied By: None Schedule Follow-up Appointment: Yes Clinical Summary of Care: Electronic Signature(s) Signed: 04/23/2022 4:34:30 PM By: Nathan [name] EMT Entered By: Nathan [name] on 04/23/2022 16:34:30 Nathan [name] ([id number]) [id number] Page 2 of 2 -------------------------------------------------------------------------------- Vitals Details Patient Name: Date of Service: Nathan [name] [name] 04/23/2022 1:00 PM Medical Record Number: [id number] Patient Account Number: [id number] Date of Birth/Sex: Treating RN: April 17, 1946 (76 y.o. Nathan [name] Primary Care Nathan [name]: Nathan [name] Other Clinician: Valeria [name] Referring Nathan [name]: Treating Nathan [name]/Extender: Nathan [name] in Treatment: 5 Vital Signs Time Taken: 12:56 Temperature (F): 98.1 Height (in): 73 Pulse (bpm): 71 Weight (lbs): 183 Respiratory Rate (breaths/min): 16 Body Mass Index (BMI): 24.1 Blood Pressure (mmHg): 167/97 Reference Range: 80 - 120 mg / dl Electronic Signature(s) Signed: 04/23/2022 4:31:43 PM By: Nathan [name] EMT Entered By: Nathan [name] on 04/23/2022 16:31:42

## 2022-04-23 NOTE — Progress Notes (Signed)
LYFE, MONGER ([id number]) [id number] Page 1 of 2 Visit Report for 04/23/2022 HBO Details Patient Name: Date of Service: Nathan [name] [name] 04/23/2022 1:00 PM Medical Record Number: [id number] Patient Account Number: [id number] Date of Birth/Sex: Treating RN: 10-07-1945 (76 y.o. 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 HBO Treatment Details Treatment Number: 26 Patient Type: Outpatient Chamber Type: Monoplace Chamber Serial #: [id number] Treatment Protocol: 2.0 ATA with 90 minutes oxygen, and no air breaks Treatment Details Compression Rate Down: 2.0 psi / minute De-Compression Rate Up: 2.0 psi / minute Air breaks and breathing Decompress Decompress Compress Tx Pressure Begins Reached periods Begins Ends (leave unused spaces blank) Chamber Pressure (ATA 1 2 ------2 1 ) Clock Time (24 hr) 12:58 13:07 - - - - - - 14:38 14:45 Treatment Length: 107 (minutes) Treatment Segments: 4 Vital Signs Capillary Blood Glucose Reference Range: 80 - 120 mg / dl HBO Diabetic Blood Glucose Intervention Range: <131 mg/dl or >249 mg/dl Time Vitals Blood Respiratory Capillary Blood Glucose Pulse Action Type: Pulse: Temperature: Taken: Pressure: Rate: Glucose (mg/dl): Meter #: Oximetry (%) Taken: Pre 12:56 167/97 71 16 98.1 Post 14:47 149/82 66 18 97.3 Treatment Response Treatment Toleration: Well Treatment Completion Status: Treatment Completed without Adverse Event Physician HBO Attestation: I certify that I supervised this HBO treatment in accordance with Medicare guidelines. A trained emergency  response team is readily available per Yes hospital policies and procedures. Continue HBOT as ordered. Yes Electronic Signature(s) Signed: 04/26/2022 10:15:01 AM By: Kalman Shan DO Previous Signature: 04/23/2022 4:33:35 PM Version By: [name] EMT Entered By: Kalman Shan on 04/26/2022 10:14:46 [name] ([id number]) [id number] Page 2 of 2 -------------------------------------------------------------------------------- HBO Safety Checklist Details Patient Name: Date of Service: Nathan [name] [name] 04/23/2022 1:00 PM Medical Record Number: [id number] Patient Account Number: [id number] Date of Birth/Sex: Treating RN: 21-Aug-1945 (76 y.o. [name] Primary Care Harika Laidlaw: [name] Other Clinician: [name] Referring Deven Furia: Treating Elna Radovich/Extender: [name] in Treatment: 5 HBO Safety Checklist Items Safety Checklist Consent Form Signed Patient voided / foley secured and emptied When did you last eato 0900 Last dose of injectable or oral agent NA Ostomy pouch emptied and vented if applicable NA All implantable devices assessed, documented and approved NA Intravenous access site secured and place NA Valuables secured Linens and cotton and cotton/polyester blend (less than 51% polyester) Personal oil-based products / skin lotions / body lotions removed Wigs or hairpieces removed NA Smoking or tobacco materials removed Books / newspapers / magazines / loose paper removed Cologne, aftershave, perfume and deodorant removed Jewelry removed (may wrap wedding band) Make-up removed NA Hair care products removed Battery operated devices (external) removed Heating patches and chemical warmers removed Titanium eyewear removed NA Nail polish cured greater than 10 hours NA Casting material cured greater than 10 hours NA Hearing aids removed NA Loose dentures or partials  removed NA Prosthetics have been removed NA Patient demonstrates correct use of air break device (if applicable) Patient concerns have been addressed Patient grounding bracelet on and cord attached to chamber Specifics for Inpatients (complete in addition to above) Medication sheet sent with patient NA Intravenous medications needed or due during therapy sent with patient NA Drainage tubes (  e.g. nasogastric tube or chest tube secured and vented) NA Endotracheal or Tracheotomy tube secured NA Cuff deflated of air and inflated with saline NA Airway suctioned NA Notes The safety checklist was done before the treatment was started. Electronic Signature(s) Signed: 04/23/2022 4:32:29 PM By: [name] EMT Entered By: [name] on 04/23/2022 16:32:29

## 2022-04-24 ENCOUNTER — Encounter (HOSPITAL_BASED_OUTPATIENT_CLINIC_OR_DEPARTMENT_OTHER): Payer: Medicare Other | Admitting: General Surgery

## 2022-04-24 DIAGNOSIS — N3041 Irradiation cystitis with hematuria: Secondary | ICD-10-CM | POA: Diagnosis not present

## 2022-04-24 NOTE — Progress Notes (Addendum)
KARNELL, VANDERLOOP ([id number]) [id number] Page 1 of 2 Visit Report for 04/24/2022 HBO Details Patient Name: Date of Service: Nathan [name] [name] 04/24/2022 1:00 PM Medical Record Number: [id number] Patient Account Number: [id number] Date of Birth/Sex: Treating RN: 12-07-1945 (76 y.o. 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 HBO Treatment Details Treatment Number: 27 Patient Type: Outpatient Chamber Type: Monoplace Chamber Serial #: [id number] Treatment Protocol: 2.0 ATA with 90 minutes oxygen, and no air breaks Treatment Details Compression Rate Down: 2.0 psi / minute De-Compression Rate Up: 2.0 psi / minute Air breaks and breathing Decompress Decompress Compress Tx Pressure Begins Reached periods Begins Ends (leave unused spaces blank) Chamber Pressure (ATA 1 2 ------2 1 ) Clock Time (24 hr) 12:48 12:57 - - - - - - 14:27 14:34 Treatment Length: 106 (minutes) Treatment Segments: 4 Vital Signs Capillary Blood Glucose Reference Range: 80 - 120 mg / dl HBO Diabetic Blood Glucose Intervention Range: <131 mg/dl or >249 mg/dl Time Vitals Blood Respiratory Capillary Blood Glucose Pulse Action Type: Pulse: Temperature: Taken: Pressure: Rate: Glucose (mg/dl): Meter #: Oximetry (%) Taken: Pre 12:43 183/80 86 16 97.5 Post 14:36 136/80 69 18 97.4 Treatment Response Treatment Toleration: Well Treatment Completion Status: Treatment Completed without Adverse Event Physician HBO Attestation: I certify that I supervised this HBO treatment in accordance with Medicare guidelines. A trained emergency  response team is readily available per Yes hospital policies and procedures. Continue HBOT as ordered. Yes Electronic Signature(s) Signed: 04/24/2022 4:12:03 PM By: [name] MD FACS Previous Signature: 04/24/2022 3:04:51 PM Version By: [name] EMT Entered By: [name] on 04/24/2022 16:12:03 [name] ([id number]) [id number] Page 2 of 2 -------------------------------------------------------------------------------- HBO Safety Checklist Details Patient Name: Date of Service: Nathan [name] [name] 04/24/2022 1:00 PM Medical Record Number: [id number] Patient Account Number: [id number] Date of Birth/Sex: Treating RN: [birth year] (76 y.o. [name] Primary Care Chastin Garlitz: [name] Other Clinician: [name] Referring Sophia Cubero: Treating Dymphna Wadley/Extender: [name] in Treatment: 5 HBO Safety Checklist Items Safety Checklist Consent Form Signed Patient voided / foley secured and emptied When did you last eato 1000 Last dose of injectable or oral agent NA Ostomy pouch emptied and vented if applicable NA All implantable devices assessed, documented and approved NA Intravenous access site secured and place NA Valuables secured Linens and cotton and cotton/polyester blend (less than 51% polyester) Personal oil-based products / skin lotions / body lotions removed Wigs or hairpieces removed NA Smoking or tobacco materials removed Books / newspapers / magazines / loose paper removed Cologne, aftershave, perfume and deodorant removed Jewelry removed (may wrap wedding band) Make-up removed NA Hair care products removed Battery operated devices (external) removed Heating patches and chemical warmers removed Titanium eyewear removed NA Nail polish cured greater than 10 hours NA Casting material cured greater than 10 hours NA Hearing aids removed NA Loose dentures or partials removed  removed by patient Prosthetics have been removed NA Patient demonstrates correct use of air break device (if applicable) Patient concerns have been addressed Patient grounding bracelet on and cord attached to chamber Specifics for Inpatients (complete in addition to above) Medication sheet sent with patient NA Intravenous medications needed or due during therapy sent with patient  NA Drainage tubes (e.g. nasogastric tube or chest tube secured and vented) NA Endotracheal or Tracheotomy tube secured NA Cuff deflated of air and inflated with saline NA Airway suctioned NA Notes The safety checklist was done before the treatment was started. Electronic Signature(s) Signed: 04/24/2022 3:02:41 PM By: [name] EMT Entered By: [name] on 04/24/2022 15:02:41

## 2022-04-24 NOTE — Progress Notes (Signed)
ALTO, GANDOLFO ([id number]) [id number] Page 1 of 2 Visit Report for 04/24/2022 Problem List Details Patient Name: Date of Service: Nathan [name] [name] 04/24/2022 1:00 PM Medical Record Number: [id number] Patient Account Number: [id number] Date of Birth/Sex: Treating RN: [birth year] (76 y.o. [name] Primary Care Provider: [name] Other Clinician: [name] Referring Provider: Treating Provider/Extender: [name] in Treatment: 5 Active Problems ICD-10 Encounter Code Description Active Date MDM Diagnosis N30.41 Irradiation cystitis with hematuria 03/14/2022 No Yes Z92.3 Personal history of irradiation 03/14/2022 No Yes C61 Malignant neoplasm of prostate 03/14/2022 No Yes I10 Essential (primary) hypertension 03/14/2022 No Yes I48.0 Paroxysmal atrial fibrillation 03/14/2022 No Yes Inactive Problems Resolved Problems Electronic Signature(s) Signed: 04/24/2022 3:05:49 PM By: [name] EMT Signed: 04/24/2022 4:11:14 PM By: [name] MD FACS Entered By: [name] on 04/24/2022 15:05:49 -------------------------------------------------------------------------------- SuperBill Details Patient Name: Date of Service: Nathan [name], Nathan [name] 04/24/2022 Medical Record Number: [id number] Patient Account Number: [id number] Date of Birth/Sex: Treating RN: 11-18-1945 (76 y.o. 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 C61 Malignant neoplasm of prostate I10 Essential (primary) hypertension I48.0 Paroxysmal atrial fibrillation Facility  Procedures : CPT4 Code Description: [id number] G0277-(Facility Use Only) HBOT full body chamber, 34mn , ICD-10 Diagnosis Description 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

## 2022-04-24 NOTE — Progress Notes (Signed)
JAKHI, DISHMAN ([id number]) [id number] Page 1 of 2 Visit Report for 04/24/2022 Arrival Information Details Patient Name: Date of Service: Nathan [name] [name] 04/24/2022 1:00 PM Medical Record Number: [id number] Patient Account Number: [id number] Date of Birth/Sex: Treating RN: 02-Jan-1946 (76 y.o. [name] Primary Care Stephenson Cichy: [name] Other Clinician: [name] Referring Junetta Hearn: Treating Prisha Hiley/Extender: [name] in Treatment: 5 Visit Information History Since Last Visit All ordered tests and consults were completed: Yes Patient Arrived: Ambulatory Added or deleted any medications: No Arrival Time: 12:31 Any new allergies or adverse reactions: No Accompanied By: None Had a fall or experienced change in No Transfer Assistance: None activities of daily living that may affect Patient Identification Verified: Yes risk of falls: Secondary Verification Process Completed: Yes Signs or symptoms of abuse/neglect since last visito No Patient Requires Transmission-Based Precautions: No Hospitalized since last visit: No Patient Has Alerts: No Implantable device outside of the clinic excluding No cellular tissue based products placed in the center since last visit: Pain Present Now: No Electronic Signature(s) Signed: 04/24/2022 3:00:28 PM By: [name] EMT Entered By: [name] on 04/24/2022 15:00:28 -------------------------------------------------------------------------------- Encounter Discharge Information Details Patient Name: Date of Service: [name], [name] 04/24/2022 1:00 PM Medical Record Number: [id number] Patient Account Number: [id number] Date of Birth/Sex: Treating RN: 09-Jan-1946 (76 y.o. [name] Primary Care Ralynn San: [name] Other Clinician: [name] Referring Lynise Porr: Treating Argentina Kosch/Extender: [name] in  Treatment: 5 Encounter Discharge Information Items Discharge Condition: Stable Ambulatory Status: Ambulatory Discharge Destination: Home Transportation: Private Auto Accompanied By: None Schedule Follow-up Appointment: Yes Clinical Summary of Care: Electronic Signature(s) Signed: 04/24/2022 3:06:18 PM By: [name] EMT Entered By: [name] on 04/24/2022 15:06:18 [name] ([id number]) [id number] Page 2 of 2 -------------------------------------------------------------------------------- Vitals Details Patient Name: Date of Service: Nathan [name] [name] 04/24/2022 1:00 PM Medical Record Number: [id number] Patient Account Number: [id number] Date of Birth/Sex: Treating RN: 1945/09/23 (76 y.o. [name] Primary Care Airam Heidecker: [name] Other Clinician: [name] Referring Creola Krotz: Treating Romy Mcgue/Extender: [name] in Treatment: 5 Vital Signs Time Taken: 12:43 Temperature (F): 97.5 Height (in): 73 Pulse (bpm): 86 Weight (lbs): 183 Respiratory Rate (breaths/min): 16 Body Mass Index (BMI): 24.1 Blood Pressure (mmHg): 183/80 Reference Range: 80 - 120 mg / dl Electronic Signature(s) Signed: 04/24/2022 3:00:57 PM By: [name] EMT Entered By: [name] on 04/24/2022 15:00:57

## 2022-04-25 ENCOUNTER — Encounter (HOSPITAL_BASED_OUTPATIENT_CLINIC_OR_DEPARTMENT_OTHER): Payer: Medicare Other | Admitting: General Surgery

## 2022-04-25 DIAGNOSIS — N3041 Irradiation cystitis with hematuria: Secondary | ICD-10-CM | POA: Diagnosis not present

## 2022-04-25 NOTE — Progress Notes (Signed)
KIDUS, DELMAN ([id number]) [id number] Page 1 of 2 Visit Report for 04/25/2022 Problem List Details Patient Name: Date of Service: Nathan [name] [name] 04/25/2022 1:00 PM Medical Record Number: [id number] Patient Account Number: [id number] Date of Birth/Sex: Treating RN: 20-Mar-1946 (76 y.o. [name] Primary Care Provider: [name] Other Clinician: [name] Referring Provider: Treating Provider/Extender: [name] in Treatment: 6 Active Problems ICD-10 Encounter Code Description Active Date MDM Diagnosis N30.41 Irradiation cystitis with hematuria 03/14/2022 No Yes Z92.3 Personal history of irradiation 03/14/2022 No Yes C61 Malignant neoplasm of prostate 03/14/2022 No Yes I10 Essential (primary) hypertension 03/14/2022 No Yes I48.0 Paroxysmal atrial fibrillation 03/14/2022 No Yes Inactive Problems Resolved Problems Electronic Signature(s) Signed: 04/25/2022 3:26:20 PM By: [name] EMT Signed: 04/25/2022 3:44:07 PM By: [name] MD FACS Entered By: [name] on 04/25/2022 15:26:20 -------------------------------------------------------------------------------- SuperBill Details Patient Name: Date of Service: Nathan [name], Nathan [name] 04/25/2022 Medical Record Number: [id number] Patient Account Number: [id number] Date of Birth/Sex: Treating RN: 06-17-1946 (76 y.o. [name] Primary Care Provider: [name] Other Clinician: [name] Referring Provider: Treating Provider/Extender: [name] in Treatment: 6 Diagnosis [address] Nathan [name], Nathan [name] ([id number]) [id number] Page 2 of 2 ICD-10 Codes Code Description 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 Personal history of irradiation C61 Malignant neoplasm of prostate I10 Essential (primary) hypertension I48.0 Paroxysmal atrial fibrillation Facility  Procedures : CPT4 Code Description: [id number] G0277-(Facility Use Only) HBOT full body chamber, 8mn , ICD-10 Diagnosis Description 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 Personal history of irradiation C61 Malignant neoplasm of prostate Modifier: Quantity: 4 Physician Procedures : CPT4 Code Description Modifier [id number]- WC PHYS HYPERBARIC OXYGEN THERAPY ICD-10 Diagnosis Description 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 Personal history of irradiation C61 Malignant neoplasm of prostate Quantity: 1 Electronic Signature(s) Signed: 04/25/2022 3:26:15 PM By: GValeria BatmanEMT Signed: 04/25/2022 3:44:07 PM By: CFredirick MaudlinMD FACS Entered By: GValeria Batmanon 04/25/2022 15:26:14

## 2022-04-25 NOTE — Progress Notes (Signed)
IZEKIEL, FLEGEL ([id number]) [id number] Page 1 of 2 Visit Report for 04/25/2022 Arrival Information Details Patient Name: Date of Service: Nathan [name] [name] 04/25/2022 1:00 PM Medical Record Number: [id number] Patient Account Number: [id number] Date of Birth/Sex: Treating RN: [birth year] (76 y.o. [name], [name] Primary Care Daaron Dimarco: [name] Other Clinician: [name] Referring Sharai Overbay: Treating Izabelle Daus/Extender: [name] in Treatment: 6 Visit Information History Since Last Visit All ordered tests and consults were completed: Yes Patient Arrived: [name] Added or deleted any medications: No Arrival Time: 12:33 Any new allergies or adverse reactions: No Accompanied By: None Had a fall or experienced change in No Transfer Assistance: None activities of daily living that may affect Patient Identification Verified: Yes risk of falls: Secondary Verification Process Completed: Yes Signs or symptoms of abuse/neglect since last visito No Patient Requires Transmission-Based Precautions: No Hospitalized since last visit: No Patient Has Alerts: No Implantable device outside of the clinic excluding No cellular tissue based products placed in the center since last visit: Pain Present Now: No Electronic Signature(s) Signed: 04/25/2022 3:23:18 PM By: [name] EMT Entered By: [name] on 04/25/2022 15:23:18 -------------------------------------------------------------------------------- Encounter Discharge Information Details Patient Name: Date of Service: [name], [name] 04/25/2022 1:00 PM Medical Record Number: [id number] Patient Account Number: [id number] Date of Birth/Sex: Treating RN: 06/23/1946 (76 y.o. [name] Primary Care Lada Fulbright: [name] Other Clinician: [name] Referring Lindyn Vossler: Treating Hendrix Yurkovich/Extender: [name] in Treatment:  6 Encounter Discharge Information Items Discharge Condition: Stable Ambulatory Status: Cane Discharge Destination: Home Transportation: Private Auto Accompanied By: None Schedule Follow-up Appointment: Yes Clinical Summary of Care: Electronic Signature(s) Signed: 04/25/2022 3:26:51 PM By: [name] EMT Entered By: [name] on 04/25/2022 15:26:51 [name] ([id number]) [id number] Page 2 of 2 -------------------------------------------------------------------------------- Vitals Details Patient Name: Date of Service: Nathan [name] [name] 04/25/2022 1:00 PM Medical Record Number: [id number] Patient Account Number: [id number] Date of Birth/Sex: Treating RN: 01-Dec-1945 (76 y.o. [name] Primary Care Maecyn Panning: [name] Other Clinician: [name] Referring Dalis Beers: Treating Jarret Torre/Extender: [name] in Treatment: 6 Vital Signs Time Taken: 12:45 Temperature (F): 97.8 Height (in): 73 Pulse (bpm): 67 Weight (lbs): 183 Respiratory Rate (breaths/min): 16 Body Mass Index (BMI): 24.1 Blood Pressure (mmHg): 158/88 Reference Range: 80 - 120 mg / dl Electronic Signature(s) Signed: 04/25/2022 3:23:50 PM By: [name] EMT Entered By: [name] on 04/25/2022 15:23:50

## 2022-04-25 NOTE — Progress Notes (Signed)
KALLAN, MERRICK ([id number]) [id number] Page 1 of 2 Visit Report for 04/25/2022 HBO Details Patient Name: Date of Service: Nathan [name] [name] 04/25/2022 1:00 PM Medical Record Number: [id number] Patient Account Number: [id number] Date of Birth/Sex: Treating RN: 30-Mar-1946 (76 y.o. 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 HBO Treatment Details Treatment Number: 28 Patient Type: Outpatient Chamber Type: Monoplace Chamber Serial #: [id number] Treatment Protocol: 2.0 ATA with 90 minutes oxygen, and no air breaks Treatment Details Compression Rate Down: 2.0 psi / minute De-Compression Rate Up: 2.0 psi / minute Air breaks and breathing Decompress Decompress Compress Tx Pressure Begins Reached periods Begins Ends (leave unused spaces blank) Chamber Pressure (ATA 1 2 ------2 1 ) Clock Time (24 hr) 12:51 12:59 - - - - - - 14:29 14:37 Treatment Length: 106 (minutes) Treatment Segments: 4 Vital Signs Capillary Blood Glucose Reference Range: 80 - 120 mg / dl HBO Diabetic Blood Glucose Intervention Range: <131 mg/dl or >249 mg/dl Time Vitals Blood Respiratory Capillary Blood Glucose Pulse Action Type: Pulse: Temperature: Taken: Pressure: Rate: Glucose (mg/dl): Meter #: Oximetry (%) Taken: Pre 12:45 158/88 67 16 97.8 Post 14:42 147/74 63 18 97.5 Treatment Response Treatment Toleration: Well Treatment Completion Status: Treatment Completed without Adverse Event Physician HBO Attestation: I certify that I supervised this HBO treatment in accordance with Medicare guidelines. A trained emergency  response team is readily available per Yes hospital policies and procedures. Continue HBOT as ordered. Yes Electronic Signature(s) Signed: 04/25/2022 3:44:31 PM By: [name] MD FACS Previous Signature: 04/25/2022 3:25:56 PM Version By: [name] EMT Entered By: [name] on 04/25/2022 15:44:30 [name] ([id number]) [id number] Page 2 of 2 -------------------------------------------------------------------------------- HBO Safety Checklist Details Patient Name: Date of Service: Nathan [name] [name] 04/25/2022 1:00 PM Medical Record Number: [id number] Patient Account Number: [id number] Date of Birth/Sex: Treating RN: August 19, 1945 (76 y.o. [name] Primary Care [place]: [name] Other Clinician: [name] Referring Elisea Khader: Treating Dosha Broshears/Extender: [name] in Treatment: 6 HBO Safety Checklist Items Safety Checklist Consent Form Signed Patient voided / foley secured and emptied When did you last eato 1000 Last dose of injectable or oral agent NA Ostomy pouch emptied and vented if applicable NA All implantable devices assessed, documented and approved NA Intravenous access site secured and place NA Valuables secured Linens and cotton and cotton/polyester blend (less than 51% polyester) Personal oil-based products / skin lotions / body lotions removed Wigs or hairpieces removed NA Smoking or tobacco materials removed Books / newspapers / magazines / loose paper removed Cologne, aftershave, perfume and deodorant removed Jewelry removed (may wrap wedding band) Make-up removed NA Hair care products removed Battery operated devices (external) removed Heating patches and chemical warmers removed Titanium eyewear removed NA Nail polish cured greater than 10 hours NA Casting material cured greater than 10 hours NA Hearing aids removed NA Loose dentures or partials removed  removed by patient Prosthetics have been removed NA Patient demonstrates correct use of air break device (if applicable) Patient concerns have been addressed Patient grounding bracelet on and cord attached to chamber Specifics for Inpatients (complete in addition to above) Medication sheet sent with patient NA Intravenous medications needed or due during therapy sent with patient  NA Drainage tubes (e.g. nasogastric tube or chest tube secured and vented) NA Endotracheal or Tracheotomy tube secured NA Cuff deflated of air and inflated with saline NA Airway suctioned NA Notes The safety checklist was done before the treatment was started. Electronic Signature(s) Signed: 04/25/2022 3:24:47 PM By: [name] EMT Entered By: [name] on 04/25/2022 15:24:47

## 2022-04-26 ENCOUNTER — Encounter (HOSPITAL_BASED_OUTPATIENT_CLINIC_OR_DEPARTMENT_OTHER): Payer: Medicare Other | Admitting: General Surgery

## 2022-04-26 DIAGNOSIS — N3041 Irradiation cystitis with hematuria: Secondary | ICD-10-CM | POA: Diagnosis not present

## 2022-04-26 NOTE — Progress Notes (Signed)
BYRON, PEACOCK ([id number]) [id number] Page 1 of 2 Visit Report for 04/23/2022 Problem List Details Patient Name: Date of Service: Nathan [name] [name] 04/23/2022 1:00 PM Medical Record Number: [id number] Patient Account Number: [id number] Date of Birth/Sex: Treating RN: Mar 26, 1946 (76 y.o. [name] Primary Care Provider: [name] Other Clinician: [name] Referring Provider: Treating Provider/Extender: [name] in Treatment: 5 Active Problems ICD-10 Encounter Code Description Active Date MDM Diagnosis N30.41 Irradiation cystitis with hematuria 03/14/2022 No Yes Z92.3 Personal history of irradiation 03/14/2022 No Yes C61 Malignant neoplasm of prostate 03/14/2022 No Yes I10 Essential (primary) hypertension 03/14/2022 No Yes I48.0 Paroxysmal atrial fibrillation 03/14/2022 No Yes Inactive Problems Resolved Problems Electronic Signature(s) Signed: 04/23/2022 4:34:03 PM By: [name] EMT Signed: 04/26/2022 10:15:01 AM By: Kalman Shan DO Entered By: [name] on 04/23/2022 16:34:03 -------------------------------------------------------------------------------- SuperBill Details Patient Name: Date of Service: Nathan [name], [name] 04/23/2022 Medical Record Number: [id number] Patient Account Number: [id number] Date of Birth/Sex: Treating RN: [birth year] (76 y.o. [name] Primary Care Provider: [name] Other Clinician: [name] Referring Provider: Treating Provider/Extender: [name] in Treatment: 5 Diagnosis [address] [name], [name] ([id number]) [id number] Page 2 of 2 ICD-10 Codes Code Description 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 Personal history of irradiation C61 Malignant neoplasm of prostate I10 Essential (primary) hypertension I48.0 Paroxysmal atrial fibrillation Facility Procedures : CPT4  Code Description: [id number] G0277-(Facility Use Only) HBOT full body chamber, 45mn , ICD-10 Diagnosis Description 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 Personal history of irradiation C61 Malignant neoplasm of prostate Modifier: Quantity: 4 Physician Procedures : CPT4 Code Description Modifier [id number] 50354- WC PHYS HYPERBARIC OXYGEN THERAPY ICD-10 Diagnosis Description 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 Personal history of irradiation C61 Malignant neoplasm of prostate Quantity: 1 Electronic Signature(s) Signed: 04/23/2022 4:33:55 PM By: GValeria BatmanEMT Signed: 04/26/2022 10:15:01 AM By: [name] Entered By: GValeria Batmanon 04/23/2022 16:33:55

## 2022-04-26 NOTE — Progress Notes (Signed)
TRESTAN, VAHLE ([id number]) [id number] Page 1 of 2 Visit Report for 04/26/2022 Arrival Information Details Patient Name: Date of Service: Nathan [name] [name] 04/26/2022 1:00 PM Medical Record Number: [id number] Patient Account Number: [id number] Date of Birth/Sex: Treating RN: [birth year] (76 y.o. [name] Primary Care Coleen Cardiff: [name] Other Clinician: [name] Referring Kamareon Sciandra: Treating Fannie Alomar/Extender: [name] in Treatment: 6 Visit Information History Since Last Visit All ordered tests and consults were completed: Yes Patient Arrived: [name] Added or deleted any medications: No Arrival Time: 12:22 Any new allergies or adverse reactions: No Accompanied By: None Had a fall or experienced change in No Transfer Assistance: None activities of daily living that may affect Patient Identification Verified: Yes risk of falls: Secondary Verification Process Completed: Yes Signs or symptoms of abuse/neglect since last visito No Patient Requires Transmission-Based Precautions: No Hospitalized since last visit: No Patient Has Alerts: No Implantable device outside of the clinic excluding No cellular tissue based products placed in the center since last visit: Pain Present Now: No Electronic Signature(s) Signed: 04/26/2022 3:34:15 PM By: [name] EMT Entered By: [name] on 04/26/2022 15:34:15 -------------------------------------------------------------------------------- Encounter Discharge Information Details Patient Name: Date of Service: Nathan [name], Nathan [name] 04/26/2022 1:00 PM Medical Record Number: [id number] Patient Account Number: [id number] Date of Birth/Sex: Treating RN: August 20, 1945 (76 y.o. [name] Primary Care Noelie Renfrow: [name] Other Clinician: [name] Referring Jhonatan Lomeli: Treating Shauntea Lok/Extender: [name] in  Treatment: 6 Encounter Discharge Information Items Discharge Condition: Stable Ambulatory Status: Cane Discharge Destination: Home Transportation: Private Auto Accompanied By: None Schedule Follow-up Appointment: Yes Clinical Summary of Care: Electronic Signature(s) Signed: 04/26/2022 3:39:22 PM By: [name] EMT Entered By: [name] on 04/26/2022 15:39:22 [name] ([id number]) [id number] Page 2 of 2 -------------------------------------------------------------------------------- Vitals Details Patient Name: Date of Service: Nathan [name] [name] 04/26/2022 1:00 PM Medical Record Number: [id number] Patient Account Number: [id number] Date of Birth/Sex: Treating RN: June 12, 1946 (76 y.o. [name] Primary Care Peggy Loge: [name] Other Clinician: [name] Referring Guinn Delarosa: Treating Emari Demmer/Extender: [name] in Treatment: 6 Vital Signs Time Taken: 12:42 Temperature (F): 97.4 Height (in): 73 Pulse (bpm): 80 Weight (lbs): 183 Respiratory Rate (breaths/min): 16 Body Mass Index (BMI): 24.1 Blood Pressure (mmHg): 171/89 Reference Range: 80 - 120 mg / dl Electronic Signature(s) Signed: 04/26/2022 3:34:41 PM By: [name] EMT Entered By: [name] on 04/26/2022 15:34:41

## 2022-04-26 NOTE — Progress Notes (Addendum)
CHIVAS, NOTZ ([id number]) [id number] Page 1 of 2 Visit Report for 04/26/2022 HBO Details Patient Name: Date of Service: Nathan [name] [name] 04/26/2022 1:00 PM Medical Record Number: [id number] Patient Account Number: [id number] Date of Birth/Sex: Treating RN: 05-Jun-1946 (76 y.o. 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 HBO Treatment Details Treatment Number: 29 Patient Type: Outpatient Chamber Type: Monoplace Chamber Serial #: [id number] Treatment Protocol: 2.0 ATA with 90 minutes oxygen, and no air breaks Treatment Details Compression Rate Down: 2.0 psi / minute De-Compression Rate Up: 2.0 psi / minute Air breaks and breathing Decompress Decompress Compress Tx Pressure Begins Reached periods Begins Ends (leave unused spaces blank) Chamber Pressure (ATA 1 2 ------2 1 ) Clock Time (24 hr) 12:46 12:54 - - - - - - 14:24 14:32 Treatment Length: 106 (minutes) Treatment Segments: 4 Vital Signs Capillary Blood Glucose Reference Range: 80 - 120 mg / dl HBO Diabetic Blood Glucose Intervention Range: <131 mg/dl or >249 mg/dl Time Vitals Blood Respiratory Capillary Blood Glucose Pulse Action Type: Pulse: Temperature: Taken: Pressure: Rate: Glucose (mg/dl): Meter #: Oximetry (%) Taken: Pre 12:42 171/89 80 16 97.4 Post 14:34 159/89 71 18 97.4 Treatment Response Treatment Toleration: Well Treatment Completion Status: Treatment Completed without Adverse Event Physician HBO Attestation: I certify that I supervised this HBO treatment in accordance with Medicare guidelines. A trained emergency  response team is readily available per Yes hospital policies and procedures. Continue HBOT as ordered. Yes Electronic Signature(s) Signed: 04/27/2022 8:03:19 AM By: [name] MD FACS Previous Signature: 04/26/2022 3:37:51 PM Version By: [name] EMT Entered By: [name] on 04/27/2022 08:03:18 [name] ([id number]) [id number] Page 2 of 2 -------------------------------------------------------------------------------- HBO Safety Checklist Details Patient Name: Date of Service: Nathan [name] [name] 04/26/2022 1:00 PM Medical Record Number: [id number] Patient Account Number: [id number] Date of Birth/Sex: Treating RN: 18-Jul-1945 (76 y.o. Nathan [name] Primary Care Breshae Belcher: [name] Other Clinician: [name] Referring Santhiago Collingsworth: Treating Krystelle Prashad/Extender: [name] in Treatment: 6 HBO Safety Checklist Items Safety Checklist Consent Form Signed Patient voided / foley secured and emptied When did you last eato 1000 Last dose of injectable or oral agent NA Ostomy pouch emptied and vented if applicable NA All implantable devices assessed, documented and approved foley cath to leg bag Intravenous access site secured and place NA Valuables secured Linens and cotton and cotton/polyester blend (less than 51% polyester) Personal oil-based products / skin lotions / body lotions removed Wigs or hairpieces removed NA Smoking or tobacco materials removed Books / newspapers / magazines / loose paper removed Cologne, aftershave, perfume and deodorant removed Jewelry removed (may wrap wedding band) Make-up removed NA Hair care products removed Battery operated devices (external) removed Heating patches and chemical warmers removed Titanium eyewear removed NA Nail polish cured greater than 10 hours NA Casting material cured greater than 10 hours NA Hearing aids removed NA Loose dentures or  partials removed removed by patient Prosthetics have been removed NA Patient demonstrates correct use of air break device (if applicable) Patient concerns have been addressed Patient grounding bracelet on and cord attached to chamber Specifics for Inpatients (complete in addition to above) Medication sheet sent with patient NA Intravenous medications needed or due during  therapy sent with patient NA Drainage tubes (e.g. nasogastric tube or chest tube secured and vented) NA Endotracheal or Tracheotomy tube secured NA Cuff deflated of air and inflated with saline NA Airway suctioned NA Notes The safety checklist was done before the treatment was started. Electronic Signature(s) Signed: 04/26/2022 3:36:32 PM By: [name] EMT Entered By: [name] on 04/26/2022 15:36:32

## 2022-04-27 ENCOUNTER — Encounter (HOSPITAL_BASED_OUTPATIENT_CLINIC_OR_DEPARTMENT_OTHER): Payer: Medicare Other | Admitting: General Surgery

## 2022-04-27 DIAGNOSIS — N3041 Irradiation cystitis with hematuria: Secondary | ICD-10-CM | POA: Diagnosis not present

## 2022-04-27 NOTE — Progress Notes (Signed)
MYLEN, MANGAN ([id number]) [id number] Page 1 of 2 Visit Report for 04/27/2022 Problem List Details Patient Name: Date of Service: Nathan [name] [name] 04/27/2022 1:00 PM Medical Record Number: [id number] Patient Account Number: [id number] Date of Birth/Sex: Treating RN: 03/04/1946 (76 y.o. 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/14/2022 No Yes Z92.3 Personal history of irradiation 03/14/2022 No Yes C61 Malignant neoplasm of prostate 03/14/2022 No Yes I10 Essential (primary) hypertension 03/14/2022 No Yes I48.0 Paroxysmal atrial fibrillation 03/14/2022 No Yes Inactive Problems Resolved Problems Electronic Signature(s) Signed: 04/27/2022 3:11:32 PM By: Nathan [name] EMT Signed: 04/27/2022 3:48:38 PM By: Nathan Maudlin MD FACS Entered By: Nathan [name] on 04/27/2022 15:11:32 -------------------------------------------------------------------------------- SuperBill Details Patient Name: Date of Service: [name], [name] 04/27/2022 Medical Record Number: [id number] Patient Account Number: [id number] Date of Birth/Sex: Treating RN: Mar 10, 1946 (76 y.o. Nathan [name] Primary Care Provider: Hayden [name] Other Clinician: Valeria [name] Referring Provider: Treating Provider/Extender: Nathan [name] Cancer in Treatment: 6 Diagnosis [address] JACCOB, CZAPLICKI ([id number]) [id number] Page 2 of 2 ICD-10 Codes Code Description 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 Personal history of irradiation C61 Malignant neoplasm of prostate I10 Essential (primary) hypertension I48.0 Paroxysmal atrial fibrillation Facility Procedures : CPT4  Code Description: [id number] G0277-(Facility Use Only) HBOT full body chamber, 49mn , ICD-10 Diagnosis Description 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 Personal history of irradiation C61 Malignant neoplasm of prostate I10 Essential  (primary) hypertension Modifier: Quantity: 4 Physician Procedures : CPT4 Code Description Modifier [id number] 89373- WC PHYS HYPERBARIC OXYGEN THERAPY ICD-10 Diagnosis Description 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 Personal history of irradiation C61 Malignant neoplasm of prostate I10 Essential (primary)  hypertension Quantity: 1 Electronic Signature(s) Signed: 04/27/2022 3:11:27 PM By: GValeria BatmanEMT Signed: 04/27/2022 3:48:38 PM By: CFredirick MaudlinMD FACS Entered By: GValeria Batmanon 04/27/2022 15:11:27

## 2022-04-27 NOTE — Progress Notes (Signed)
AXIEL, FJELD ([id number]) [id number] Page 1 of 2 Visit Report for 04/26/2022 Problem List Details Patient Name: Date of Service: Nathan [name] [name] 04/26/2022 1:00 PM Medical Record Number: [id number] Patient Account Number: [id number] Date of Birth/Sex: Treating RN: 10-01-1945 (76 y.o. [name] Primary Care Provider: [name] Other Clinician: [name] Referring Provider: Treating Provider/Extender: [name] in Treatment: 6 Active Problems ICD-10 Encounter Code Description Active Date MDM Diagnosis N30.41 Irradiation cystitis with hematuria 03/14/2022 No Yes Z92.3 Personal history of irradiation 03/14/2022 No Yes C61 Malignant neoplasm of prostate 03/14/2022 No Yes I10 Essential (primary) hypertension 03/14/2022 No Yes I48.0 Paroxysmal atrial fibrillation 03/14/2022 No Yes Inactive Problems Resolved Problems Electronic Signature(s) Signed: 04/26/2022 3:38:15 PM By: [name] EMT Signed: 04/27/2022 8:01:11 AM By: [name] MD FACS Entered By: [name] on 04/26/2022 15:38:15 -------------------------------------------------------------------------------- SuperBill Details Patient Name: Date of Service: [name], [name] 04/26/2022 Medical Record Number: [id number] Patient Account Number: [id number] Date of Birth/Sex: Treating RN: May 04, 1946 (76 y.o. 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 Personal history of irradiation C61 Malignant neoplasm of prostate I10 Essential (primary) hypertension I48.0 Paroxysmal atrial fibrillation Facility  Procedures : CPT4 Code Description: [id number]-(Facility Use Only) HBOT full body chamber, 22mn , ICD-10 Diagnosis Description 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 Personal history of irradiation C61 Malignant neoplasm of prostate Modifier: Quantity: 4 Physician Procedures : CPT4 Code Description Modifier [id number] 12248- WC PHYS HYPERBARIC OXYGEN THERAPY ICD-10 Diagnosis Description 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 Personal history of irradiation C61 Malignant neoplasm of prostate Quantity: 1 Electronic Signature(s) Signed: 04/26/2022 3:38:11 PM By: GValeria BatmanEMT Signed: 04/27/2022 8:01:11 AM By: CFredirick MaudlinMD FACS Entered By: GValeria Batmanon 04/26/2022 15:38:11

## 2022-04-27 NOTE — Progress Notes (Signed)
[name], Nathan ([id number]) [id number] Page 1 of 2 Visit Report for 04/27/2022 Arrival Information Details Patient Name: Date of Service: Nathan [name] [name] 04/27/2022 1:00 PM Medical Record Number: [id number] Patient Account Number: [id number] Date of Birth/Sex: Treating RN: 1946-06-10 (76 y.o. [name], [name] Primary Care Tico Crotteau: [name] Other Clinician: [name] Referring Cyra Spader: Treating Kimberley Speece/Extender: [name] in Treatment: 6 Visit Information History Since Last Visit All ordered tests and consults were completed: Yes Patient Arrived: [name] Added or deleted any medications: No Arrival Time: 12:22 Any new allergies or adverse reactions: No Accompanied By: None Had a fall or experienced change in No Transfer Assistance: None activities of daily living that may affect Patient Identification Verified: Yes risk of falls: Secondary Verification Process Completed: Yes Signs or symptoms of abuse/neglect since last visito No Patient Requires Transmission-Based Precautions: No Hospitalized since last visit: No Patient Has Alerts: No Implantable device outside of the clinic excluding No cellular tissue based products placed in the center since last visit: Pain Present Now: No Electronic Signature(s) Signed: 04/27/2022 3:12:21 PM By: [name] EMT Previous Signature: 04/27/2022 2:37:43 PM Version By: [name] EMT Entered By: [name] on 04/27/2022 15:12:21 -------------------------------------------------------------------------------- Encounter Discharge Information Details Patient Name: Date of Service: BA [name], Nathan [name] 04/27/2022 1:00 PM Medical Record Number: [id number] Patient Account Number: [id number] Date of Birth/Sex: Treating RN: [birth year] (76 y.o. [name], [name] Primary Care Daquon Greenleaf: [name] Other Clinician: [name] Referring Harriette Tovey: Treating  Ahlaya Ende/Extender: [name] in Treatment: 6 Encounter Discharge Information Items Discharge Condition: Stable Ambulatory Status: Cane Discharge Destination: Home Transportation: Private Auto Accompanied By: None Schedule Follow-up Appointment: Yes Clinical Summary of Care: Electronic Signature(s) Signed: 04/27/2022 3:12:08 PM By: [name] EMT Entered By: [name] on 04/27/2022 15:12:08 [name] ([id number]) [id number] Page 2 of 2 -------------------------------------------------------------------------------- Vitals Details Patient Name: Date of Service: Nathan [name] [name] 04/27/2022 1:00 PM Medical Record Number: [id number] Patient Account Number: [id number] Date of Birth/Sex: Treating RN: 12-Mar-1946 (76 y.o. [name], [name] Primary Care Azaan Leask: [name] Other Clinician: [name] Referring Channelle Bottger: Treating Advik Weatherspoon/Extender: [name] in Treatment: 6 Vital Signs Time Taken: 12:45 Temperature (F): 97.5 Height (in): 73 Pulse (bpm): 84 Weight (lbs): 183 Respiratory Rate (breaths/min): 18 Body Mass Index (BMI): 24.1 Blood Pressure (mmHg): 183/91 Reference Range: 80 - 120 mg / dl Electronic Signature(s) Signed: 04/27/2022 2:38:43 PM By: [name] EMT Entered By: [name] on 04/27/2022 14:38:43

## 2022-04-27 NOTE — Progress Notes (Signed)
KHAI, ARRONA ([id number]) [id number] Page 1 of 2 Visit Report for 04/27/2022 HBO Details Patient Name: Date of Service: Nathan [name] [name] 04/27/2022 1:00 PM Medical Record Number: [id number] Patient Account Number: [id number] Date of Birth/Sex: Treating RN: [birth year] (76 y.o. 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 HBO Treatment Details Treatment Number: 30 Patient Type: Outpatient Chamber Type: Monoplace Chamber Serial #: [id number] Treatment Protocol: 2.0 ATA with 90 minutes oxygen, and no air breaks Treatment Details Compression Rate Down: 2.0 psi / minute De-Compression Rate Up: 2.0 psi / minute Air breaks and breathing Decompress Decompress Compress Tx Pressure Begins Reached periods Begins Ends (leave unused spaces blank) Chamber Pressure (ATA 1 2 ------2 1 ) Clock Time (24 hr) 12:47 12:55 - - - - - - 14:25 14:33 Treatment Length: 106 (minutes) Treatment Segments: 4 Vital Signs Capillary Blood Glucose Reference Range: 80 - 120 mg / dl HBO Diabetic Blood Glucose Intervention Range: <131 mg/dl or >249 mg/dl Time Vitals Blood Respiratory Capillary Blood Glucose Pulse Action Type: Pulse: Temperature: Taken: Pressure: Rate: Glucose (mg/dl): Meter #: Oximetry (%) Taken: Pre 12:45 183/91 84 18 97.5 Post 14:38 157/82 69 16 97.4 Treatment Response Treatment Toleration: Well Treatment Completion Status: Treatment Completed without Adverse Event Physician HBO Attestation: I certify that I supervised this HBO treatment in accordance with Medicare guidelines. A trained emergency  response team is readily available per Yes hospital policies and procedures. Continue HBOT as ordered. Yes Electronic Signature(s) Signed: 04/27/2022 3:50:12 PM By: [name] MD FACS Previous Signature: 04/27/2022 3:10:53 PM Version By: [name] EMT Entered By: [name] on 04/27/2022 15:50:12 [name] ([id number]) [id number] Page 2 of 2 -------------------------------------------------------------------------------- HBO Safety Checklist Details Patient Name: Date of Service: Nathan [name] [name] 04/27/2022 1:00 PM Medical Record Number: [id number] Patient Account Number: [id number] Date of Birth/Sex: Treating RN: 09-20-1945 (76 y.o. [name], [name] Primary Care Dyshawn Cangelosi: [name] Other Clinician: [name] Referring Laramie Gelles: Treating Lamoyne Palencia/Extender: [name] in Treatment: 6 HBO Safety Checklist Items Safety Checklist Consent Form Signed Patient voided / foley secured and emptied When did you last eato 0930 Last dose of injectable or oral agent NA Ostomy pouch emptied and vented if applicable NA All implantable devices assessed, documented and approved foley cath to leg bag Intravenous access site secured and place NA Valuables secured Linens and cotton and cotton/polyester blend (less than 51% polyester) Personal oil-based products / skin lotions / body lotions removed Wigs or hairpieces removed NA Smoking or tobacco materials removed Books / newspapers / magazines / loose paper removed Cologne, aftershave, perfume and deodorant removed Jewelry removed (may wrap wedding band) Make-up removed NA Hair care products removed Battery operated devices (external) removed Heating patches and chemical warmers removed Titanium eyewear removed NA Nail polish cured greater than 10 hours NA Casting material cured greater than 10 hours NA Hearing aids removed NA Loose dentures or  partials removed removed by patient Prosthetics have been removed NA Patient demonstrates correct use of air break device (if applicable) Patient concerns have been addressed Patient grounding bracelet on and cord attached to chamber Specifics for Inpatients (complete in addition to above) Medication sheet sent with patient NA Intravenous medications needed or due during  therapy sent with patient NA Drainage tubes (e.g. nasogastric tube or chest tube secured and vented) NA Endotracheal or Tracheotomy tube secured NA Cuff deflated of air and inflated with saline NA Airway suctioned NA Notes The safety checklist was done before the treatment was started. Electronic Signature(s) Signed: 04/27/2022 2:40:15 PM By: [name] EMT Entered By: [name] on 04/27/2022 14:40:14

## 2022-04-30 ENCOUNTER — Encounter (HOSPITAL_BASED_OUTPATIENT_CLINIC_OR_DEPARTMENT_OTHER): Payer: Medicare Other | Admitting: Internal Medicine

## 2022-04-30 DIAGNOSIS — C61 Malignant neoplasm of prostate: Secondary | ICD-10-CM

## 2022-04-30 DIAGNOSIS — Z923 Personal history of irradiation: Secondary | ICD-10-CM

## 2022-04-30 DIAGNOSIS — N3041 Irradiation cystitis with hematuria: Secondary | ICD-10-CM

## 2022-04-30 NOTE — Progress Notes (Signed)
PONCE, SKILLMAN ([id number]) [id number] Page 1 of 2 Visit Report for 04/30/2022 Arrival Information Details Patient Name: Date of Service: Nathan [name] [name] 04/30/2022 1:00 PM Medical Record Number: [id number] Patient Account Number: [id number] Date of Birth/Sex: Treating RN: 12/19/1945 (76 y.o. [name] Primary Care Clem Wisenbaker: [name] Other Clinician: [name] Referring Joni Colegrove: Treating Rashi Granier/Extender: [name] in Treatment: 6 Visit Information History Since Last Visit All ordered tests and consults were completed: Yes Patient Arrived: Ambulatory Added or deleted any medications: No Arrival Time: 12:27 Any new allergies or adverse reactions: No Accompanied By: None Had a fall or experienced change in No Transfer Assistance: None activities of daily living that may affect Patient Identification Verified: Yes risk of falls: Secondary Verification Process Completed: Yes Signs or symptoms of abuse/neglect since last visito No Patient Requires Transmission-Based Precautions: No Hospitalized since last visit: No Patient Has Alerts: No Implantable device outside of the clinic excluding No cellular tissue based products placed in the center since last visit: Pain Present Now: No Electronic Signature(s) Signed: 04/30/2022 2:45:06 PM By: [name] EMT Entered By: [name] on 04/30/2022 14:45:06 -------------------------------------------------------------------------------- Encounter Discharge Information Details Patient Name: Date of Service: Nathan [name], [name] 04/30/2022 1:00 PM Medical Record Number: [id number] Patient Account Number: [id number] Date of Birth/Sex: Treating RN: 1945-09-11 (76 y.o. [name] Primary Care Niko Jakel: [name] Other Clinician: [name] Referring Anahit Klumb: Treating Abril Cappiello/Extender: [name] in  Treatment: 6 Encounter Discharge Information Items Discharge Condition: Stable Ambulatory Status: Ambulatory Discharge Destination: Home Transportation: Private Auto Accompanied By: None Schedule Follow-up Appointment: Yes Clinical Summary of Care: Electronic Signature(s) Signed: 04/30/2022 2:50:35 PM By: [name] EMT Entered By: [name] on 04/30/2022 14:50:34 [name] ([id number]) [id number] Page 2 of 2 -------------------------------------------------------------------------------- Vitals Details Patient Name: Date of Service: Nathan [name] [name] 04/30/2022 1:00 PM Medical Record Number: [id number] Patient Account Number: [id number] Date of Birth/Sex: Treating RN: April 03, 1946 (76 y.o. [name] Primary Care Hedaya Latendresse: [name] Other Clinician: [name] Referring Abayomi Pattison: Treating Fauna Neuner/Extender: [name] in Treatment: 6 Vital Signs Time Taken: 12:46 Temperature (F): 97.8 Height (in): 73 Pulse (bpm): 79 Weight (lbs): 183 Respiratory Rate (breaths/min): 16 Body Mass Index (BMI): 24.1 Blood Pressure (mmHg): 167/95 Reference Range: 80 - 120 mg / dl Electronic Signature(s) Signed: 04/30/2022 2:45:30 PM By: [name] EMT Entered By: [name] on 04/30/2022 14:45:30

## 2022-04-30 NOTE — Progress Notes (Addendum)
Nathan [name], Nathan [name] ([id number]) [id number] Page 1 of 2 Visit Report for 04/30/2022 HBO Details Patient Name: Date of Service: Nathan [name] [name] 04/30/2022 1:00 PM Medical Record Number: [id number] Patient Account Number: [id number] Date of Birth/Sex: Treating RN: 30-Jan-1946 (76 y.o. 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 HBO Treatment Details Treatment Number: 31 Patient Type: Outpatient Chamber Type: Monoplace Chamber Serial #: [id number] Treatment Protocol: 2.0 ATA with 90 minutes oxygen, and no air breaks Treatment Details Compression Rate Down: 2.0 psi / minute De-Compression Rate Up: 2.0 psi / minute Air breaks and breathing Decompress Decompress Compress Tx Pressure Begins Reached periods Begins Ends (leave unused spaces blank) Chamber Pressure (ATA 1 2 ------2 1 ) Clock Time (24 hr) 12:51 13:01 - - - - - - 14:31 14:38 Treatment Length: 107 (minutes) Treatment Segments: 4 Vital Signs Capillary Blood Glucose Reference Range: 80 - 120 mg / dl HBO Diabetic Blood Glucose Intervention Range: <131 mg/dl or >249 mg/dl Time Vitals Blood Respiratory Capillary Blood Glucose Pulse Action Type: Pulse: Temperature: Taken: Pressure: Rate: Glucose (mg/dl): Meter #: Oximetry (%) Taken: Pre 12:46 167/95 79 16 97.8 Post 14:40 143/76 67 18 97.4 Treatment Response Treatment Toleration: Well Treatment Completion Status: Treatment Completed without Adverse Event Physician HBO Attestation: I certify that I supervised this HBO treatment in accordance with Medicare guidelines. A trained emergency  response team is readily available per Yes hospital policies and procedures. Continue HBOT as ordered. Yes Electronic Signature(s) Signed: 05/04/2022 1:40:10 PM By: Kalman Shan DO Previous Signature: 04/30/2022 2:49:18 PM Version By: [name] EMT Entered By: Kalman Shan on 05/01/2022 14:47:04 Nathan [name] ([id number]) [id number] Page 2 of 2 -------------------------------------------------------------------------------- HBO Safety Checklist Details Patient Name: Date of Service: Nathan [name] [name] 04/30/2022 1:00 PM Medical Record Number: [id number] Patient Account Number: [id number] Date of Birth/Sex: Treating RN: Aug 30, 1945 (76 y.o. Nathan [name] Primary Care Kayleeann Huxford: [name] Other Clinician: [name] Referring Erine Phenix: Treating Lateshia Schmoker/Extender: [name] in Treatment: 6 HBO Safety Checklist Items Safety Checklist Consent Form Signed Patient voided / foley secured and emptied When did you last eato 1000 Last dose of injectable or oral agent NA Ostomy pouch emptied and vented if applicable NA All implantable devices assessed, documented and approved foley cath to leg bag Intravenous access site secured and place NA Valuables secured Linens and cotton and cotton/polyester blend (less than 51% polyester) Personal oil-based products / skin lotions / body lotions removed Wigs or hairpieces removed NA Smoking or tobacco materials removed Books / newspapers / magazines / loose paper removed Cologne, aftershave, perfume and deodorant removed Jewelry removed (may wrap wedding band) Make-up removed NA Hair care products removed Battery operated devices (external) removed Heating patches and chemical warmers removed Titanium eyewear removed NA Nail polish cured greater than 10 hours NA Casting material cured greater than 10 hours NA Hearing aids removed NA Loose dentures or  partials removed removed by patient Prosthetics have been removed NA Patient demonstrates correct use of air break device (if applicable) Patient concerns have been addressed Patient grounding bracelet on and cord attached to chamber Specifics for Inpatients (complete in addition to above) Medication sheet sent with patient NA Intravenous medications needed or due during therapy  sent with patient NA Drainage tubes (e.g. nasogastric tube or chest tube secured and vented) NA Endotracheal or Tracheotomy tube secured NA Cuff deflated of air and inflated with saline NA Airway suctioned NA Notes The safety checklist was done before the treatment was started. Electronic Signature(s) Signed: 04/30/2022 2:46:31 PM By: [name] EMT Entered By: [name] on 04/30/2022 14:46:31

## 2022-05-01 ENCOUNTER — Encounter (HOSPITAL_BASED_OUTPATIENT_CLINIC_OR_DEPARTMENT_OTHER): Payer: Medicare Other | Admitting: Internal Medicine

## 2022-05-01 DIAGNOSIS — C61 Malignant neoplasm of prostate: Secondary | ICD-10-CM | POA: Diagnosis not present

## 2022-05-01 DIAGNOSIS — Z923 Personal history of irradiation: Secondary | ICD-10-CM

## 2022-05-01 DIAGNOSIS — N3041 Irradiation cystitis with hematuria: Secondary | ICD-10-CM | POA: Diagnosis not present

## 2022-05-01 NOTE — Progress Notes (Signed)
KAVEON, BLATZ ([id number]) [id number] Page 1 of 2 Visit Report for 05/01/2022 Arrival Information Details Patient Name: Date of Service: Nathan [name] [name] 05/01/2022 1:00 PM Medical Record Number: [id number] Patient Account Number: [id number] Date of Birth/Sex: Treating RN: 21-Sep-1945 (76 y.o. Nathan [name], [name] Primary Care Daralyn Bert: [name] Other Clinician: [name] Referring Shavanna Furnari: Treating Draven Natter/Extender: [name] in Treatment: 6 Visit Information History Since Last Visit All ordered tests and consults were completed: Yes Patient Arrived: [name] Added or deleted any medications: No Arrival Time: 12:30 Any new allergies or adverse reactions: No Accompanied By: self Had a fall or experienced change in No Transfer Assistance: None activities of daily living that may affect Patient Identification Verified: Yes risk of falls: Secondary Verification Process Completed: Yes Signs or symptoms of abuse/neglect since last visito No Patient Requires Transmission-Based Precautions: No Hospitalized since last visit: No Patient Has Alerts: No Implantable device outside of the clinic excluding No cellular tissue based products placed in the center since last visit: Pain Present Now: No Electronic Signature(s) Signed: 05/01/2022 4:29:36 PM By: [name] CHT EMT BS , , Previous Signature: 05/01/2022 4:21:56 PM Version By: [name] CHT EMT BS , , Entered By: [name] on 05/01/2022 16:29:36 -------------------------------------------------------------------------------- Encounter Discharge Information Details Patient Name: Date of Service: Nathan [name], [name] 05/01/2022 1:00 PM Medical Record Number: [id number] Patient Account Number: [id number] Date of Birth/Sex: Treating RN: 24-Jan-1946 (76 y.o. Nathan [name] Primary Care Shiri Hodapp: [name] Other Clinician: [name] Referring Kennette Cuthrell: Treating Ricky Gallery/Extender: [name] in Treatment: 6 Encounter Discharge Information Items Discharge Condition: Stable Ambulatory Status: Cane Discharge Destination: Home Transportation: Private Auto Accompanied By: self Schedule Follow-up Appointment: No Clinical Summary of Care: Electronic Signature(s) Signed: 05/01/2022 4:29:23 PM By: [name] CHT EMT BS , , Entered By: [name] on 05/01/2022 16:29:23 Nathan [name] ([id number]) [id number] Page 2 of 2 -------------------------------------------------------------------------------- Vitals Details Patient Name: Date of Service: Nathan [name] [name] 05/01/2022 1:00 PM Medical Record Number: [id number] Patient Account Number: [id number] Date of Birth/Sex: Treating RN: 07-05-1945 (76 y.o. Nathan [name], [name] Primary Care Germaine Ripp: [name] Other Clinician: [name] Referring Mosiah Bastin: Treating Sorren Vallier/Extender: [name] in Treatment: 6 Vital Signs Time Taken: 12:57 Temperature (F): 98.2 Height (in): 73 Pulse (bpm): 84 Weight (lbs): 183 Respiratory Rate (breaths/min): 20 Body Mass Index (BMI): 24.1 Blood Pressure (mmHg): 170/92 Reference Range: 80 - 120 mg / dl Electronic Signature(s) Signed: 05/01/2022 4:22:19 PM By: [name] CHT EMT BS , , Entered By: [name] on 05/01/2022 16:22:19

## 2022-05-01 NOTE — Progress Notes (Addendum)
KAAN, TOSH ([id number]) [id number] Page 1 of 2 Visit Report for 05/01/2022 HBO Details Patient Name: Date of Service: Nathan [name] [name] 05/01/2022 1:00 PM Medical Record Number: [id number] Patient Account Number: [id number] Date of Birth/Sex: Treating RN: 1946/01/31 (76 y.o. 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 HBO Treatment Details Treatment Number: 32 Patient Type: Outpatient Chamber Type: Monoplace Chamber Serial #: [id number] Treatment Protocol: 2.0 ATA with 90 minutes oxygen, and no air breaks Treatment Details Compression Rate Down: 2.0 psi / minute De-Compression Rate Up: 2.0 psi / minute Air breaks and breathing Decompress Decompress Compress Tx Pressure Begins Reached periods Begins Ends (leave unused spaces blank) Chamber Pressure (ATA 1 2 ------2 1 ) Clock Time (24 hr) 13:01 13:12 - - - - - - 14:42 14:50 Treatment Length: 109 (minutes) Treatment Segments: 4 Vital Signs Capillary Blood Glucose Reference Range: 80 - 120 mg / dl HBO Diabetic Blood Glucose Intervention Range: <131 mg/dl or >249 mg/dl Type: Time Vitals Blood Respiratory Capillary Blood Glucose Pulse Action Pulse: Temperature: Taken: Pressure: Rate: Glucose (mg/dl): Meter #: Oximetry (%) Taken: Pre 12:57 170/92 84 20 98.2 none per protocol Post 14:53 157/87 74 18 97.3 none per protocol Treatment Response Treatment Toleration: Well Treatment Completion Status: Treatment Completed without Adverse Event Physician HBO Attestation: I certify that I supervised this HBO treatment in accordance with  Medicare guidelines. A trained emergency response team is readily available per Yes hospital policies and procedures. Continue HBOT as ordered. Yes Electronic Signature(s) Signed: 05/04/2022 1:40:10 PM By: Kalman Shan DO Previous Signature: 05/01/2022 4:25:00 PM Version By: [name] CHT EMT BS , , Entered By: Kalman Shan on 05/01/2022 16:36:18 [name] ([id number]) [id number] Page 2 of 2 -------------------------------------------------------------------------------- HBO Safety Checklist Details Patient Name: Date of Service: Nathan [name] [name] 05/01/2022 1:00 PM Medical Record Number: [id number] Patient Account Number: [id number] Date of Birth/Sex: Treating RN: 10-27-1945 (76 y.o. [name], [name] Primary Care Brielynn Sekula: [name] Other Clinician: [name] Referring Heer Justiss: Treating Bell Cai/Extender: [name] in Treatment: 6 HBO Safety Checklist Items Safety Checklist Consent Form Signed Patient voided / foley secured and emptied When did you last eato 0930 Last dose of injectable or oral agent n/a Ostomy pouch emptied and vented if applicable NA All implantable devices assessed, documented and approved NA Intravenous access site secured and place NA Valuables secured Linens and cotton and cotton/polyester blend (less than 51% polyester) Personal oil-based products / skin lotions / body lotions removed Wigs or hairpieces removed NA Smoking or tobacco materials removed NA Books / newspapers / magazines / loose paper removed Cologne, aftershave, perfume and deodorant removed Jewelry removed (may wrap wedding band) Make-up removed Hair care products removed Battery operated devices (external) removed Heating patches and chemical warmers removed Titanium eyewear removed Nail polish cured greater than 10 hours NA Casting material cured greater than 10 hours NA Hearing aids  removed NA Loose dentures or partials removed Prosthetics have been removed Patient demonstrates correct use of air break device (if applicable) NA Patient concerns have been addressed NA Patient grounding bracelet on and cord attached to chamber NA Specifics for Inpatients (complete in addition to above) Medication sheet sent with patient NA Intravenous medications needed  or due during therapy sent with patient NA Drainage tubes (e.g. nasogastric tube or chest tube secured and vented) NA Endotracheal or Tracheotomy tube secured NA Cuff deflated of air and inflated with saline NA Airway suctioned NA Notes Paper version used prior to treatment. Electronic Signature(s) Signed: 05/01/2022 4:23:54 PM By: [name] CHT EMT BS , , Entered By: [name] on 05/01/2022 16:23:54

## 2022-05-02 ENCOUNTER — Encounter (HOSPITAL_BASED_OUTPATIENT_CLINIC_OR_DEPARTMENT_OTHER): Payer: Medicare Other | Attending: General Surgery | Admitting: General Surgery

## 2022-05-02 DIAGNOSIS — E119 Type 2 diabetes mellitus without complications: Secondary | ICD-10-CM | POA: Insufficient documentation

## 2022-05-02 DIAGNOSIS — Y842 Radiological procedure and radiotherapy as the cause of abnormal reaction of the patient, or of later complication, without mention of misadventure at the time of the procedure: Secondary | ICD-10-CM | POA: Insufficient documentation

## 2022-05-02 DIAGNOSIS — Z8546 Personal history of malignant neoplasm of prostate: Secondary | ICD-10-CM | POA: Diagnosis not present

## 2022-05-02 DIAGNOSIS — Z87891 Personal history of nicotine dependence: Secondary | ICD-10-CM | POA: Insufficient documentation

## 2022-05-02 DIAGNOSIS — N3041 Irradiation cystitis with hematuria: Secondary | ICD-10-CM | POA: Diagnosis not present

## 2022-05-02 DIAGNOSIS — I10 Essential (primary) hypertension: Secondary | ICD-10-CM | POA: Insufficient documentation

## 2022-05-02 DIAGNOSIS — I480 Paroxysmal atrial fibrillation: Secondary | ICD-10-CM | POA: Diagnosis not present

## 2022-05-02 NOTE — Progress Notes (Signed)
JAKOBI, THETFORD ([id number]) [id number] Page 1 of 1 Visit Report for 05/02/2022 SuperBill Details Patient Name: Date of Service: Nathan [name] [name] 05/02/2022 Medical Record Number: [id number] Patient Account Number: [id number] Date of Birth/Sex: Treating RN: 25-Feb-1946 (76 y.o. 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

## 2022-05-02 NOTE — Progress Notes (Addendum)
KAHLIN, MARK ([id number]) [id number] Page 1 of 2 Visit Report for 05/02/2022 HBO Details Patient Name: Date of Service: Nathan [name] [name] 05/02/2022 1:00 PM Medical Record Number: [id number] Patient Account Number: [id number] Date of Birth/Sex: Treating RN: 1945/10/11 (76 y.o. 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 HBO Treatment Details Treatment Number: 33 Patient Type: Outpatient Chamber Type: Monoplace Chamber Serial #: [id number] Treatment Protocol: 2.0 ATA with 90 minutes oxygen, and no air breaks Treatment Details Compression Rate Down: 1.5 psi / minute De-Compression Rate Up: 2.0 psi / minute Air breaks and breathing Decompress Decompress Compress Tx Pressure Begins Reached periods Begins Ends (leave unused spaces blank) Chamber Pressure (ATA 1 2 ------2 1 ) Clock Time (24 hr) 13:16 13:27 - - - - - - 14:57 15:04 Treatment Length: 108 (minutes) Treatment Segments: 4 Vital Signs Capillary Blood Glucose Reference Range: 80 - 120 mg / dl HBO Diabetic Blood Glucose Intervention Range: <131 mg/dl or >249 mg/dl Type: Time Vitals Blood Respiratory Capillary Blood Glucose Pulse Action Pulse: Temperature: Taken: Pressure: Rate: Glucose (mg/dl): Meter #: Oximetry (%) Taken: Pre 13:10 153/93 82 20 98.1 none per protocol Post 15:08 145/79 72 18 97.5 none per protocol Treatment Response Treatment Toleration: Well Treatment Completion Status: Treatment Completed without Adverse Event Physician HBO Attestation: I certify that I supervised this HBO treatment in accordance with  Medicare guidelines. A trained emergency response team is readily available per Yes hospital policies and procedures. Continue HBOT as ordered. Yes Electronic Signature(s) Signed: 05/02/2022 4:41:57 PM By: [name] MD FACS Previous Signature: 05/02/2022 4:21:24 PM Version By: [name] CHT EMT BS , , Entered By: [name] on 05/02/2022 16:41:56 [name] ([id number]) [id number] Page 2 of 2 -------------------------------------------------------------------------------- HBO Safety Checklist Details Patient Name: Date of Service: Nathan [name] [name] 05/02/2022 1:00 PM Medical Record Number: [id number] Patient Account Number: [id number] Date of Birth/Sex: Treating RN: [birth year] (76 y.o. [name] Primary Care Javionna Leder: [name] Other Clinician: [name] Referring Damari Suastegui: Treating Signora Zucco/Extender: [name] in Treatment: 7 HBO Safety Checklist Items Safety Checklist Consent Form Signed Patient voided / foley secured and emptied When did you last eato 1030 Last dose of injectable or oral agent n/a Ostomy pouch emptied and vented if applicable NA All implantable devices assessed, documented and approved NA Intravenous access site secured and place NA Valuables secured Linens and cotton and cotton/polyester blend (less than 51% polyester) Personal oil-based products / skin lotions / body lotions removed Wigs or hairpieces removed NA Smoking or tobacco materials removed NA Books / newspapers / magazines / loose paper removed Cologne, aftershave, perfume and deodorant removed Jewelry removed (may wrap wedding band) Make-up removed NA Hair care products removed Battery operated devices (external) removed Heating patches and chemical warmers removed Titanium eyewear removed Nail polish cured greater than 10 hours NA Casting material cured greater than 10 hours NA Hearing  aids removed NA Loose dentures or partials removed dentures removed Prosthetics have been removed NA Patient demonstrates correct use of air break device (if applicable) Patient concerns have been addressed Patient grounding bracelet on and cord attached to chamber Specifics for Inpatients (complete in addition to above) Medication sheet sent with patient NA Intravenous  medications needed or due during therapy sent with patient NA Drainage tubes (e.g. nasogastric tube or chest tube secured and vented) NA Endotracheal or Tracheotomy tube secured NA Cuff deflated of air and inflated with saline NA Airway suctioned NA Notes Paper version used prior to treatment. Electronic Signature(s) Signed: 05/02/2022 1:53:59 PM By: [name] CHT EMT BS , , Previous Signature: 05/02/2022 1:51:31 PM Version By: [name] CHT EMT BS , , Entered By: [name] on 05/02/2022 13:53:59

## 2022-05-02 NOTE — Progress Notes (Addendum)
Nathan [name] ([id number]) [id number] Page 1 of 2 Visit Report for 05/02/2022 Arrival Information Details Patient Name: Date of Service: Nathan [name] [name] 05/02/2022 1:00 PM Medical Record Number: [id number] Patient Account Number: [id number] Date of Birth/Sex: Treating RN: Aug 05, 1945 (76 y.o. [name] Primary Care Zaina Jenkin: [name] Other Clinician: [name] Referring Anmarie Fukushima: Treating Levy Cedano/Extender: [name] in Treatment: 7 Visit Information History Since Last Visit All ordered tests and consults were completed: Yes Patient Arrived: [name] Added or deleted any medications: No Arrival Time: 12:31 Any new allergies or adverse reactions: No Accompanied By: self Had a fall or experienced change in No Transfer Assistance: None activities of daily living that may affect Patient Identification Verified: Yes risk of falls: Secondary Verification Process Completed: Yes Signs or symptoms of abuse/neglect since last visito No Patient Requires Transmission-Based Precautions: No Hospitalized since last visit: No Patient Has Alerts: No Implantable device outside of the clinic excluding No cellular tissue based products placed in the center since last visit: Pain Present Now: No Electronic Signature(s) Signed: 05/02/2022 1:53:43 PM By: [name] CHT EMT BS , , Previous Signature: 05/02/2022 1:49:51 PM Version By: [name] CHT EMT BS , , Entered By: [name] on 05/02/2022 13:53:43 -------------------------------------------------------------------------------- Encounter Discharge Information Details Patient Name: Date of Service: Nathan [name], [name] 05/02/2022 1:00 PM Medical Record Number: [id number] Patient Account Number: [id number] Date of Birth/Sex: Treating RN: 1945/12/05 (76 y.o. [name] Primary Care Sameeha Rockefeller: [name] Other Clinician: [name] Referring Mamadou Breon: Treating Kaslyn Richburg/Extender: [name] in Treatment: 7 Encounter Discharge Information Items Discharge Condition: Stable Ambulatory Status: Cane Discharge Destination: Home Transportation: Private Auto Accompanied By: self Schedule Follow-up Appointment: No Clinical Summary of Care: Electronic Signature(s) Signed: 05/02/2022 4:22:09 PM By: [name] CHT EMT BS , , Entered By: [name] on 05/02/2022 16:22:09 Nathan [name] ([id number]) [id number] Page 2 of 2 -------------------------------------------------------------------------------- Vitals Details Patient Name: Date of Service: Nathan [name] [name] 05/02/2022 1:00 PM Medical Record Number: [id number] Patient Account Number: [id number] Date of Birth/Sex: Treating RN: 01/31/1946 (76 y.o. [name] Primary Care Stephanne Greeley: [name] Other Clinician: [name] Referring Jo-Anne Kluth: Treating Raynesha Tiedt/Extender: [name] in Treatment: 7 Vital Signs Time Taken: 13:10 Temperature (F): 98.1 Height (in): 73 Pulse (bpm): 82 Weight (lbs): 183 Respiratory Rate (breaths/min): 20 Body Mass Index (BMI): 24.1 Blood Pressure (mmHg): 153/93 Reference Range: 80 - 120 mg / dl Electronic Signature(s) Signed: 05/02/2022 1:53:50 PM By: [name] CHT EMT BS , , Previous Signature: 05/02/2022 1:50:26 PM Version By: [name] CHT EMT BS , , Entered By: [name] on 05/02/2022 13:53:50

## 2022-05-03 ENCOUNTER — Encounter (HOSPITAL_BASED_OUTPATIENT_CLINIC_OR_DEPARTMENT_OTHER): Payer: Medicare Other | Admitting: General Surgery

## 2022-05-03 DIAGNOSIS — N3041 Irradiation cystitis with hematuria: Secondary | ICD-10-CM | POA: Diagnosis not present

## 2022-05-03 NOTE — Progress Notes (Signed)
ETIENNE, MOWERS ([id number]) [id number] Page 1 of 2 Visit Report for 05/03/2022 Arrival Information Details Patient Name: Date of Service: Nathan [name] Nathan [name] 05/03/2022 1:00 PM Medical Record Number: [id number] Patient Account Number: [id number] Date of Birth/Sex: Treating RN: [birth year] (76 y.o. Nathan [name], [name] Primary Care Ymani Porcher: [name] Other Clinician: [name] Referring Jaimie Redditt: Treating Aseel Truxillo/Extender: [name] in Treatment: 7 Visit Information History Since Last Visit Added or deleted any medications: No Patient Arrived: Ambulatory Any new allergies or adverse reactions: No Arrival Time: 12:34 Had a fall or experienced change in No Accompanied By: None activities of daily living that may affect Transfer Assistance: None risk of falls: Patient Identification Verified: Yes Signs or symptoms of abuse/neglect since last visito No Secondary Verification Process Completed: Yes Hospitalized since last visit: No Patient Requires Transmission-Based Precautions: No Implantable device outside of the clinic excluding No Patient Has Alerts: No cellular tissue based products placed in the center since last visit: Pain Present Now: No Electronic Signature(s) Signed: 05/03/2022 2:37:26 PM By: [name] EMT Entered By: [name] on 05/03/2022 14:37:26 -------------------------------------------------------------------------------- Encounter Discharge Information Details Patient Name: Date of Service: Nathan [name] 05/03/2022 1:00 PM Medical Record Number: [id number] Patient Account Number: [id number] Date of Birth/Sex: Treating RN: 1945/08/05 (76 y.o. Nathan [name] Primary Care Shoshanah Dapper: [name] Other Clinician: [name] Referring Ethell Blatchford: Treating Carlyn Mullenbach/Extender: [name] in Treatment: 7 Encounter Discharge Information Items Discharge  Condition: Stable Ambulatory Status: Ambulatory Discharge Destination: Home Transportation: Private Auto Accompanied By: None Schedule Follow-up Appointment: Yes Clinical Summary of Care: Electronic Signature(s) Signed: 05/03/2022 3:47:36 PM By: [name] EMT Entered By: [name] on 05/03/2022 15:47:35 [name] ([id number]) [id number] Page 2 of 2 -------------------------------------------------------------------------------- Vitals Details Patient Name: Date of Service: Nathan [name] Nathan [name] 05/03/2022 1:00 PM Medical Record Number: [id number] Patient Account Number: [id number] Date of Birth/Sex: Treating RN: 1945/11/15 (76 y.o. Nathan [name] Primary Care Justene Jensen: [name] Other Clinician: [name] Referring Lajoya Dombek: Treating Emmagene Ortner/Extender: [name] in Treatment: 7 Vital Signs Time Taken: 13:09 Temperature (F): 97.4 Height (in): 73 Pulse (bpm): 75 Weight (lbs): 183 Respiratory Rate (breaths/min): 16 Body Mass Index (BMI): 24.1 Blood Pressure (mmHg): 168/84 Reference Range: 80 - 120 mg / dl Electronic Signature(s) Signed: 05/03/2022 2:37:49 PM By: [name] EMT Entered By: [name] on 05/03/2022 14:37:49

## 2022-05-03 NOTE — Progress Notes (Signed)
KIOWA, HOLLAR ([id number]) [id number] Page 1 of 2 Visit Report for 05/03/2022 Problem List Details Patient Name: Date of Service: Nathan [name] [name] 05/03/2022 1:00 PM Medical Record Number: [id number] Patient Account Number: [id number] Date of Birth/Sex: Treating RN: 1946/05/04 (76 y.o. Nathan [name] Primary Care Provider: [name] Other Clinician: [name] Referring Provider: Treating Provider/Extender: [name] in Treatment: 7 Active Problems ICD-10 Encounter Code Description Active Date MDM Diagnosis N30.41 Irradiation cystitis with hematuria 03/14/2022 No Yes Z92.3 Personal history of irradiation 03/14/2022 No Yes C61 Malignant neoplasm of prostate 03/14/2022 No Yes I10 Essential (primary) hypertension 03/14/2022 No Yes I48.0 Paroxysmal atrial fibrillation 03/14/2022 No Yes Inactive Problems Resolved Problems Electronic Signature(s) Signed: 05/03/2022 3:47:02 PM By: [name] EMT Signed: 05/03/2022 4:34:52 PM By: [name] MD FACS Entered By: [name] on 05/03/2022 15:47:02 -------------------------------------------------------------------------------- SuperBill Details Patient Name: Date of Service: [name], [name] 05/03/2022 Medical Record Number: [id number] Patient Account Number: [id number] Date of Birth/Sex: Treating RN: 1945/10/09 (76 y.o. Nathan [name] Primary Care Provider: [name] Other Clinician: [name] Referring Provider: Treating Provider/Extender: [name] in Treatment: [address] 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

## 2022-05-03 NOTE — Progress Notes (Signed)
Nathan [name] ([id number]) [id number] Page 1 of 2 Visit Report for 05/03/2022 HBO Details Patient Name: Date of Service: Nathan [name] [name] 05/03/2022 1:00 PM Medical Record Number: [id number] Patient Account Number: [id number] Date of Birth/Sex: Treating RN: [birth year] (76 y.o. 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 HBO Treatment Details Treatment Number: 34 Patient Type: Outpatient Chamber Type: Monoplace Chamber Serial #: [id number] Treatment Protocol: 2.0 ATA with 90 minutes oxygen, and no air breaks Treatment Details Compression Rate Down: 2.0 psi / minute De-Compression Rate Up: 2.0 psi / minute Air breaks and breathing Decompress Decompress Compress Tx Pressure Begins Reached periods Begins Ends (leave unused spaces blank) Chamber Pressure (ATA 1 2 ------2 1 ) Clock Time (24 hr) 13:16 13:24 - - - - - - 14:56 15:03 Treatment Length: 107 (minutes) Treatment Segments: 4 Vital Signs Capillary Blood Glucose Reference Range: 80 - 120 mg / dl HBO Diabetic Blood Glucose Intervention Range: <131 mg/dl or >249 mg/dl Time Vitals Blood Respiratory Capillary Blood Glucose Pulse Action Type: Pulse: Temperature: Taken: Pressure: Rate: Glucose (mg/dl): Meter #: Oximetry (%) Taken: Pre 13:09 168/84 75 16 97.4 Post 15:08 156/84 67 18 97.5 Treatment Response Treatment Toleration: Well Treatment Completion Status: Treatment Completed without Adverse Event Physician HBO Attestation: I certify that I supervised this HBO treatment in accordance with Medicare guidelines. A trained emergency  response team is readily available per Yes hospital policies and procedures. Continue HBOT as ordered. Yes Electronic Signature(s) Signed: 05/03/2022 4:36:03 PM By: [name] MD FACS Previous Signature: 05/03/2022 3:46:32 PM Version By: [name] EMT Previous Signature: 05/03/2022 3:44:30 PM Version By: [name] MD FACS Previous Signature: 05/03/2022 2:40:19 PM Version By: [name] EMT Entered By: [name] on 05/03/2022 16:36:03 Nathan [name] ([id number]) [id number] Page 2 of 2 -------------------------------------------------------------------------------- HBO Safety Checklist Details Patient Name: Date of Service: Nathan [name] [name] 05/03/2022 1:00 PM Medical Record Number: [id number] Patient Account Number: [id number] Date of Birth/Sex: Treating RN: 1945-09-10 (76 y.o. Nathan [name] Mention Primary Care Crystalann Korf: [name] Other Clinician: [name] Referring Dontez Hauss: Treating Kourtland Coopman/Extender: [name] in Treatment: 7 HBO Safety Checklist Items Safety Checklist Consent Form Signed Patient voided / foley secured and emptied When did you last eato 0930 Last dose of injectable or oral agent NA Ostomy pouch emptied and vented if applicable NA All implantable devices assessed, documented and approved NA Intravenous access site secured and place NA Valuables secured Linens and cotton and cotton/polyester blend (less than 51% polyester) Personal oil-based products / skin lotions / body lotions removed Wigs or hairpieces removed NA Smoking or tobacco materials removed Books / newspapers / magazines / loose paper removed Cologne, aftershave, perfume and deodorant removed Jewelry removed (may wrap wedding band) Make-up removed NA Hair care products removed Battery operated devices (external) removed Heating patches and chemical warmers removed Titanium eyewear removed NA Nail polish  cured greater than 10 hours NA Casting material cured greater than 10 hours NA Hearing aids removed NA Loose dentures or partials removed removed by patient Prosthetics have been removed NA Patient demonstrates correct use of air break device (if applicable) Patient concerns have been addressed Patient grounding bracelet on and cord attached to chamber Specifics for Inpatients (  complete in addition to above) Medication sheet sent with patient NA Intravenous medications needed or due during therapy sent with patient NA Drainage tubes (e.g. nasogastric tube or chest tube secured and vented) NA Endotracheal or Tracheotomy tube secured NA Cuff deflated of air and inflated with saline NA Airway suctioned NA Notes The safety checklist was done before the treatment was started. Electronic Signature(s) Signed: 05/03/2022 2:39:35 PM By: [name] EMT Entered By: [name] on 05/03/2022 14:39:35

## 2022-05-04 ENCOUNTER — Encounter (HOSPITAL_BASED_OUTPATIENT_CLINIC_OR_DEPARTMENT_OTHER): Payer: Medicare Other | Admitting: General Surgery

## 2022-05-04 DIAGNOSIS — N3041 Irradiation cystitis with hematuria: Secondary | ICD-10-CM | POA: Diagnosis not present

## 2022-05-04 NOTE — Progress Notes (Signed)
ROCCO, KERKHOFF ([id number]) [id number] Page 1 of 2 Visit Report for 04/30/2022 Problem List Details Patient Name: Date of Service: Nathan [name] 04/30/2022 1:00 PM Medical Record Number: [id number] Patient Account Number: [id number] Date of Birth/Sex: Treating RN: 1946-05-21 (76 y.o. [name] Primary Care Provider: [name] Other Clinician: [name] Referring Provider: Treating Provider/Extender: [name] in Treatment: 6 Active Problems ICD-10 Encounter Code Description Active Date MDM Diagnosis N30.41 Irradiation cystitis with hematuria 03/14/2022 No Yes Z92.3 Personal history of irradiation 03/14/2022 No Yes C61 Malignant neoplasm of prostate 03/14/2022 No Yes I10 Essential (primary) hypertension 03/14/2022 No Yes I48.0 Paroxysmal atrial fibrillation 03/14/2022 No Yes Inactive Problems Resolved Problems Electronic Signature(s) Signed: 04/30/2022 2:50:00 PM By: [name] EMT Signed: 05/04/2022 1:40:10 PM By: Kalman Shan DO Entered By: [name] on 04/30/2022 14:49:59 -------------------------------------------------------------------------------- SuperBill Details Patient Name: Date of Service: Nathan [name], Nathan [name] 04/30/2022 Medical Record Number: [id number] Patient Account Number: [id number] Date of Birth/Sex: Treating RN: Apr 17, 1946 (76 y.o. 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 I10 Essential (primary) hypertension I48.0 Paroxysmal atrial fibrillation Facility  Procedures : CPT4 Code Description: [id number] G0277-(Facility Use Only) HBOT full body chamber, 25mn , ICD-10 Diagnosis Description 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

## 2022-05-04 NOTE — Progress Notes (Signed)
SHERRIL, HEYWARD ([id number]) [id number] Page 1 of 1 Visit Report for 05/01/2022 SuperBill Details Patient Name: Date of Service: Nathan [name] [name] 05/01/2022 Medical Record Number: [id number] Patient Account Number: [id number] Date of Birth/Sex: Treating RN: 09/07/1945 (76 y.o. 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

## 2022-05-04 NOTE — Progress Notes (Signed)
ROLAND, LIPKE ([id number]) [id number] Page 1 of 2 Visit Report for 05/04/2022 Arrival Information Details Patient Name: Date of Service: Nathan [name] [name] 05/04/2022 1:00 PM Medical Record Number: [id number] Patient Account Number: [id number] Date of Birth/Sex: Treating RN: 1945-10-19 (76 y.o. Nathan [name] Primary Care Yehia Mcbain: [name] Other Clinician: [name] Referring Shadonna Benedick: Treating Paloma Grange/Extender: [name] in Treatment: 7 Visit Information History Since Last Visit All ordered tests and consults were completed: Yes Patient Arrived: Nathan [name] Added or deleted any medications: No Arrival Time: 12:21 Any new allergies or adverse reactions: No Accompanied By: self Had a fall or experienced change in No Transfer Assistance: None activities of daily living that may affect Patient Identification Verified: Yes risk of falls: Secondary Verification Process Completed: Yes Signs or symptoms of abuse/neglect since last visito No Patient Requires Transmission-Based Precautions: No Hospitalized since last visit: No Patient Has Alerts: No Implantable device outside of the clinic excluding No cellular tissue based products placed in the center since last visit: Pain Present Now: No Electronic Signature(s) Signed: 05/04/2022 4:23:30 PM By: [name] CHT EMT BS , , Entered By: [name] on 05/04/2022 16:23:30 -------------------------------------------------------------------------------- Encounter Discharge Information Details Patient Name: Date of Service: Nathan [name], [name] 05/04/2022 1:00 PM Medical Record Number: [id number] Patient Account Number: [id number] Date of Birth/Sex: Treating RN: September 15, 1945 (76 y.o. Nathan [name] Primary Care Salli Bodin: [name] Other Clinician: [name] Referring Yavonne Kiss: Treating Reganne Messerschmidt/Extender: [name] in Treatment: 7 Encounter Discharge Information Items Discharge Condition: Stable Ambulatory Status: Cane Discharge Destination: Home Transportation: Private Auto Accompanied By: self Schedule Follow-up Appointment: No Clinical Summary of Care: Electronic Signature(s) Signed: 05/04/2022 4:27:05 PM By: [name] CHT EMT BS , , Entered By: [name] on 05/04/2022 16:27:05 Nathan [name] ([id number]) [id number] Page 2 of 2 -------------------------------------------------------------------------------- Vitals Details Patient Name: Date of Service: Nathan [name] [name] 05/04/2022 1:00 PM Medical Record Number: [id number] Patient Account Number: [id number] Date of Birth/Sex: Treating RN: Jul 30, 1945 (76 y.o. Nathan [name] Primary Care Michaelle Bottomley: [name] Other Clinician: [name] Referring Carthel Castille: Treating Zak Gondek/Extender: [name] in Treatment: 7 Vital Signs Time Taken: 12:35 Temperature (F): 98.1 Height (in): 73 Pulse (bpm): 81 Weight (lbs): 183 Respiratory Rate (breaths/min): 16 Body Mass Index (BMI): 24.1 Blood Pressure (mmHg): 174/88 Reference Range: 80 - 120 mg / dl Electronic Signature(s) Signed: 05/04/2022 4:23:49 PM By: [name] CHT EMT BS , , Entered By: [name] on 05/04/2022 16:23:49

## 2022-05-04 NOTE — Progress Notes (Signed)
[name], [name] ([id number]) [id number] Page 1 of 1 Visit Report for 05/04/2022 SuperBill Details Patient Name: Date of Service: Nathan [name] [name] 05/04/2022 Medical Record Number: [id number] Patient Account Number: [id number] Date of Birth/Sex: Treating RN: 27-Feb-1946 (76 y.o. 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

## 2022-05-04 NOTE — Progress Notes (Signed)
Nathan [name], Nathan [name] ([id number]) [id number] Page 1 of 2 Visit Report for 05/04/2022 HBO Details Patient Name: Date of Service: Nathan [name] [name] 05/04/2022 1:00 PM Medical Record Number: [id number] Patient Account Number: [id number] Date of Birth/Sex: Treating RN: 10/09/1945 (76 y.o. 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 HBO Treatment Details Treatment Number: 35 Patient Type: Outpatient Chamber Type: Monoplace Chamber Serial #: [id number] Treatment Protocol: 2.0 ATA with 90 minutes oxygen, and no air breaks Treatment Details Compression Rate Down: 2.0 psi / minute De-Compression Rate Up: 2.0 psi / minute Air breaks and breathing Decompress Decompress Compress Tx Pressure Begins Reached periods Begins Ends (leave unused spaces blank) Chamber Pressure (ATA 1 2 ------2 1 ) Clock Time (24 hr) 12:38 12:45 - - - - - - 14:15 14:24 Treatment Length: 106 (minutes) Treatment Segments: 4 Vital Signs Capillary Blood Glucose Reference Range: 80 - 120 mg / dl HBO Diabetic Blood Glucose Intervention Range: <131 mg/dl or >249 mg/dl Type: Time Vitals Blood Respiratory Capillary Blood Glucose Pulse Action Pulse: Temperature: Taken: Pressure: Rate: Glucose (mg/dl): Meter #: Oximetry (%) Taken: Pre 12:35 174/88 81 16 98.1 none per protocol Post 14:26 146/80 73 18 97.6 none per protocol Treatment Response Treatment Toleration: Well Treatment Completion Status: Treatment Completed without Adverse Event Physician HBO Attestation: I certify that I supervised this HBO treatment in accordance with  Medicare guidelines. A trained emergency response team is readily available per Yes hospital policies and procedures. Continue HBOT as ordered. Yes Electronic Signature(s) Signed: 05/04/2022 4:29:23 PM By: Nathan Maudlin MD FACS Previous Signature: 05/04/2022 4:26:17 PM Version By: Nathan [name] , , Previous Signature: 05/04/2022 4:25:07 PM Version By: Nathan [name] , , Entered By: Nathan [name] on 05/04/2022 16:29:23 Nathan [name] ([id number]) [id number] Page 2 of 2 -------------------------------------------------------------------------------- HBO Safety Checklist Details Patient Name: Date of Service: Nathan [name] [name] 05/04/2022 1:00 PM Medical Record Number: [id number] Patient Account Number: [id number] Date of Birth/Sex: Treating RN: 1945/07/24 (76 y.o. Nathan [name] Primary Care Nathan [name]: Nathan [name] Other Clinician: Valeria [name] Referring Nathan [name]: Treating Nathan [name]/Extender: Nathan [name] in Treatment: 7 HBO Safety Checklist Items Safety Checklist Consent Form Signed Patient voided / foley secured and emptied When did you last eato 0930 Last dose of injectable or oral agent Ostomy pouch emptied and vented if applicable NA All implantable devices assessed, documented and approved NA Intravenous access site secured and place NA Valuables secured Linens and cotton and cotton/polyester blend (less than 51% polyester) Personal oil-based products / skin lotions / body lotions removed Wigs or hairpieces removed NA Smoking or tobacco materials removed NA Books / newspapers / magazines / loose paper removed Cologne, aftershave, perfume and deodorant removed Jewelry removed (may wrap wedding band) Make-up removed NA Hair care products removed Battery operated devices (external) removed Heating patches and chemical warmers removed Titanium eyewear removed Nail polish cured  greater than 10 hours NA Casting material cured greater than 10 hours NA Hearing aids removed NA Loose dentures or partials removed dentures removed Prosthetics have been removed NA Patient demonstrates correct use of air break device (if applicable) Patient concerns have been addressed Patient grounding bracelet on and cord attached to chamber Specifics for  Inpatients (complete in addition to above) Medication sheet sent with patient NA Intravenous medications needed or due during therapy sent with patient NA Drainage tubes (e.g. nasogastric tube or chest tube secured and vented) NA Endotracheal or Tracheotomy tube secured NA Cuff deflated of air and inflated with saline NA Airway suctioned NA Notes Paper version used prior to treatment. Electronic Signature(s) Signed: 05/04/2022 4:24:43 PM By: Nathan [name] , , Entered By: Nathan [name] on 05/04/2022 16:24:43

## 2022-05-07 ENCOUNTER — Encounter (HOSPITAL_BASED_OUTPATIENT_CLINIC_OR_DEPARTMENT_OTHER): Payer: Medicare Other | Admitting: General Surgery

## 2022-05-07 DIAGNOSIS — N3041 Irradiation cystitis with hematuria: Secondary | ICD-10-CM | POA: Diagnosis not present

## 2022-05-07 NOTE — Progress Notes (Addendum)
ADRIK, KHIM ([id number]) [id number] Page 1 of 2 Visit Report for 05/07/2022 HBO Details Patient Name: Date of Service: Nathan [name] [name] 05/07/2022 1:00 PM Medical Record Number: [id number] Patient Account Number: [id number] Date of Birth/Sex: Treating RN: May 04, 1946 (76 y.o. 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 HBO Treatment Details Treatment Number: 36 Patient Type: Outpatient Chamber Type: Monoplace Chamber Serial #: [id number] Treatment Protocol: 2.0 ATA with 90 minutes oxygen, and no air breaks Treatment Details Compression Rate Down: 2.0 psi / minute De-Compression Rate Up: 2.0 psi / minute Air breaks and breathing Decompress Decompress Compress Tx Pressure Begins Reached periods Begins Ends (leave unused spaces blank) Chamber Pressure (ATA 1 2 ------2 1 ) Clock Time (24 hr) 12:53 13:01 - - - - - - 14:32 14:40 Treatment Length: 107 (minutes) Treatment Segments: 4 Vital Signs Capillary Blood Glucose Reference Range: 80 - 120 mg / dl HBO Diabetic Blood Glucose Intervention Range: <131 mg/dl or >249 mg/dl Time Vitals Blood Respiratory Capillary Blood Glucose Pulse Action Type: Pulse: Temperature: Taken: Pressure: Rate: Glucose (mg/dl): Meter #: Oximetry (%) Taken: Pre 12:48 158/88 75 16 98.1 Post 14:44 155/80 68 16 97.9 Treatment Response Treatment Toleration: Well Treatment Completion Status: Treatment Completed without Adverse Event Physician HBO Attestation: I certify that I supervised this HBO treatment in accordance with Medicare guidelines. A trained emergency  response team is readily available per Yes hospital policies and procedures. Continue HBOT as ordered. Yes Electronic Signature(s) Signed: 05/07/2022 4:05:19 PM By: [name] MD FACS Previous Signature: 05/07/2022 3:08:22 PM Version By: [name] EMT Entered By: [name] on 05/07/2022 16:05:19 [name] ([id number]) [id number] Page 2 of 2 -------------------------------------------------------------------------------- HBO Safety Checklist Details Patient Name: Date of Service: Nathan [name] [name] 05/07/2022 1:00 PM Medical Record Number: [id number] Patient Account Number: [id number] Date of Birth/Sex: Treating RN: 04/10/1946 (76 y.o. [name] Primary Care Laasia Arcos: [name] Other Clinician: [name] Referring Jaeger Trueheart: Treating Shene Maxfield/Extender: [name] in Treatment: 7 HBO Safety Checklist Items Safety Checklist Consent Form Signed Patient voided / foley secured and emptied When did you last eato 1000 Last dose of injectable or oral agent NA Ostomy pouch emptied and vented if applicable NA All implantable devices assessed, documented and approved foley cath to leg bag Intravenous access site secured and place NA Valuables secured Linens and cotton and cotton/polyester blend (less than 51% polyester) Personal oil-based products / skin lotions / body lotions removed Wigs or hairpieces removed NA Smoking or tobacco materials removed Books / newspapers / magazines / loose paper removed Cologne, aftershave, perfume and deodorant removed Jewelry removed (may wrap wedding band) Make-up removed NA Hair care products removed Battery operated devices (external) removed Heating patches and chemical warmers removed Titanium eyewear removed NA Nail polish cured greater than 10 hours NA Casting material cured greater than 10 hours NA Hearing aids removed NA Loose dentures or  partials removed removed by patient Prosthetics have been removed NA Patient demonstrates correct use of air break device (if applicable) Patient concerns have been addressed Patient grounding bracelet on and cord attached to chamber Specifics for Inpatients (complete in addition to above) Medication sheet sent with patient NA Intravenous medications needed or due during  therapy sent with patient NA Drainage tubes (e.g. nasogastric tube or chest tube secured and vented) NA Endotracheal or Tracheotomy tube secured NA Cuff deflated of air and inflated with saline NA Airway suctioned NA Notes The safety checklist was done before the treatment was started. Electronic Signature(s) Signed: 05/07/2022 3:07:14 PM By: [name] EMT Entered By: [name] on 05/07/2022 15:07:13

## 2022-05-07 NOTE — Progress Notes (Signed)
DORRIEN, GRUNDER ([id number]) [id number] Page 1 of 2 Visit Report for 05/07/2022 Problem List Details Patient Name: Date of Service: Nathan [name] [name] 05/07/2022 1:00 PM Medical Record Number: [id number] Patient Account Number: [id number] Date of Birth/Sex: Treating RN: 1946-05-24 (76 y.o. [name] Primary Care Provider: [name] Other Clinician: [name] Referring Provider: Treating Provider/Extender: [name] in Treatment: 7 Active Problems ICD-10 Encounter Code Description Active Date MDM Diagnosis N30.41 Irradiation cystitis with hematuria 03/14/2022 No Yes Z92.3 Personal history of irradiation 03/14/2022 No Yes C61 Malignant neoplasm of prostate 03/14/2022 No Yes I10 Essential (primary) hypertension 03/14/2022 No Yes I48.0 Paroxysmal atrial fibrillation 03/14/2022 No Yes Inactive Problems Resolved Problems Electronic Signature(s) Signed: 05/07/2022 3:09:03 PM By: [name] EMT Signed: 05/07/2022 4:03:30 PM By: [name] MD FACS Entered By: [name] on 05/07/2022 15:09:03 -------------------------------------------------------------------------------- SuperBill Details Patient Name: Date of Service: [name], [name] 05/07/2022 Medical Record Number: [id number] Patient Account Number: [id number] Date of Birth/Sex: Treating RN: [birth year] (76 y.o. [name] Primary Care Provider: [name] Other Clinician: [name] Referring Provider: Treating Provider/Extender: [name] in Treatment: 7 Diagnosis [address] [name], [name] ([id number]) [id number] Page 2 of 2 ICD-10 Codes Code Description 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 Personal history of irradiation C61 Malignant neoplasm of prostate I10 Essential (primary) hypertension I48.0 Paroxysmal atrial fibrillation Facility Procedures : CPT4  Code Description: [id number] G0277-(Facility Use Only) HBOT full body chamber, 22mn , ICD-10 Diagnosis Description 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 Personal history of irradiation C61 Malignant neoplasm of prostate Modifier: Quantity: 4 Physician Procedures : CPT4 Code Description Modifier [id number] PHYS HYPERBARIC OXYGEN THERAPY ICD-10 Diagnosis Description 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 Personal history of irradiation C61 Malignant neoplasm of prostate Quantity: 1 Electronic Signature(s) Signed: 05/07/2022 3:08:58 PM By: GValeria BatmanEMT Signed: 05/07/2022 4:03:30 PM By: CFredirick MaudlinMD FACS Entered By: GValeria Batmanon 05/07/2022 15:08:57

## 2022-05-07 NOTE — Progress Notes (Signed)
Nathan [name] ([id number]) [id number] Page 1 of 2 Visit Report for 05/07/2022 Arrival Information Details Patient Name: Date of Service: Nathan [name] 05/07/2022 1:00 PM Medical Record Number: [id number] Patient Account Number: [id number] Date of Birth/Sex: Treating RN: 12/07/1945 (76 y.o. Nathan [name], Nathan [name] Primary Care Kasey Hansell: [name] Other Clinician: [name] Referring Ryen Rhames: Treating Yader Criger/Extender: [name] in Treatment: 7 Visit Information History Since Last Visit All ordered tests and consults were completed: Yes Patient Arrived: [name] Added or deleted any medications: No Arrival Time: 12:35 Any new allergies or adverse reactions: No Accompanied By: None Had a fall or experienced change in No Transfer Assistance: None activities of daily living that may affect Patient Identification Verified: Yes risk of falls: Secondary Verification Process Completed: Yes Signs or symptoms of abuse/neglect since last visito No Patient Requires Transmission-Based Precautions: No Hospitalized since last visit: No Patient Has Alerts: No Implantable device outside of the clinic excluding No cellular tissue based products placed in the center since last visit: Pain Present Now: No Electronic Signature(s) Signed: 05/07/2022 3:09:50 PM By: [name] EMT Previous Signature: 05/07/2022 3:05:44 PM Version By: [name] EMT Entered By: [name] on 05/07/2022 15:09:50 -------------------------------------------------------------------------------- Encounter Discharge Information Details Patient Name: Date of Service: Nathan [name] [name] 05/07/2022 1:00 PM Medical Record Number: [id number] Patient Account Number: [id number] Date of Birth/Sex: Treating RN: 1945-11-22 (76 y.o. Nathan [name] Primary Care Zoe Goonan: [name] Other Clinician: [name] Referring Koy Lamp: Treating  Osias Resnick/Extender: [name] in Treatment: 7 Encounter Discharge Information Items Discharge Condition: Stable Ambulatory Status: Cane Discharge Destination: Home Transportation: Private Auto Accompanied By: None Schedule Follow-up Appointment: Yes Clinical Summary of Care: Electronic Signature(s) Signed: 05/07/2022 3:09:37 PM By: [name] EMT Entered By: [name] on 05/07/2022 15:09:37 Nathan [name] ([id number]) [id number] Page 2 of 2 -------------------------------------------------------------------------------- Vitals Details Patient Name: Date of Service: Nathan [name] 05/07/2022 1:00 PM Medical Record Number: [id number] Patient Account Number: [id number] Date of Birth/Sex: Treating RN: 1945-11-09 (76 y.o. Nathan [name] Primary Care Paulita Licklider: [name] Other Clinician: [name] Referring Archer Moist: Treating Taytum Scheck/Extender: [name] in Treatment: 7 Vital Signs Time Taken: 12:48 Temperature (F): 98.1 Height (in): 73 Pulse (bpm): 75 Weight (lbs): 183 Respiratory Rate (breaths/min): 16 Body Mass Index (BMI): 24.1 Blood Pressure (mmHg): 158/88 Reference Range: 80 - 120 mg / dl Electronic Signature(s) Signed: 05/07/2022 3:06:08 PM By: [name] EMT Entered By: [name] on 05/07/2022 15:06:08

## 2022-05-08 ENCOUNTER — Encounter (HOSPITAL_BASED_OUTPATIENT_CLINIC_OR_DEPARTMENT_OTHER): Payer: Medicare Other | Admitting: General Surgery

## 2022-05-08 DIAGNOSIS — N3041 Irradiation cystitis with hematuria: Secondary | ICD-10-CM | POA: Diagnosis not present

## 2022-05-08 NOTE — Progress Notes (Signed)
ELADIO, DENTREMONT ([id number]) [id number] Page 1 of 6 Visit Report for 05/08/2022 Chief Complaint Document Details Patient Name: Date of Service: Nathan [name] [name] 05/08/2022 12:30 PM Medical Record Number: [id number] Patient Account Number: [id number] Date of Birth/Sex: Treating RN: [birth year] (76 y.o. M) Primary Care Provider: [name] Other Clinician: Referring Provider: Treating Provider/Extender: [name] in Treatment: 7 Information Obtained from: Patient Chief Complaint Patient presents to the Wound Care center for HBO eval due to radiation cystitis with hematuria Electronic Signature(s) Signed: 05/08/2022 1:01:02 PM By: [name] MD FACS Entered By: [name] on 05/08/2022 13:01:01 -------------------------------------------------------------------------------- HPI Details Patient Name: Date of Service: Nathan [name], Nathan [name] 05/08/2022 12:30 PM Medical Record Number: [id number] Patient Account Number: [id number] Date of Birth/Sex: Treating RN: 02/29/1946 (76 y.o. M) Primary Care Provider: [name] Other Clinician: Referring Provider: Treating Provider/Extender: [name] in Treatment: 7 History of Present Illness HPI Description: ADMISSION 03/14/2022 This is a 76 year old man with a past medical history significant for prostate cancer. He was treated with radiation therapy and received a total of 70 Gy. His treatment took place between September 16, 2019 and October 23, 2019. He apparently had issues with hematuria even during his treatment phase. He has had a suprapubic catheter placed in the interim. Over the past year, he has had multiple hospitalizations for acute blood loss anemia secondary to hematuria. This is required multiple cystoscopies with clot evacuation and fulguration of bleeding portions of his bladder and prostate. 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 He has also undergone selective embolization of the prostatic artery in an effort to reduce his bleeding. His urologist has referred him to the wound care center for hyperbaric oxygen therapy. He does have paroxysmal atrial fibrillation but does not take anticoagulation because of his bleeding. He developed a left lower extremity DVT and has had a caval filter placed. He is not diabetic and does not smoke. His most recent EKG (02/13/2022) showed sinus rhythm with some premature atrial complexes. Multiple chest x-rays, including the most recent done on 02/13/2022, show no evidence of cardiopulmonary disease. 10/20; this is a patient who is receiving hyperbaric oxygen for radiation cystitis. He has a suprapubic catheter in place. He is at 27 out of 40 HBO treatments. He states that he is had no evidence of bleeding since he started treatment until this morning. He noticed some discoloration in the Foley bag and then it turned red however that is not continued over the course of today. He did not have any other systemic symptoms. He has tolerated HBO well 05/08/2022: This is a 30-day update. He continues to tolerate his hyperbaric oxygen therapy. He had his Foley catheter changed out last week. He is unable to tell me whether or not he is still having any hematuria. I examined the urine in his catheter and it appears to be slightly orange-tinted. Electronic Signature(s) Signed: 05/08/2022 1:02:14 PM By: [name] MD FACS Entered By: [name] on 05/08/2022 13:02:14 Nathan [name] ([id number]) [id number] Page 2 of 6 -------------------------------------------------------------------------------- Physical Exam Details Patient Name: Date of Service: Nathan [name], Nathan [name] 05/08/2022 12:30 PM Medical Record Number: [id number] Patient Account Number: [id number] Date of Birth/Sex: Treating RN: 10-Sep-1945 (76 y.o. M) Primary  Care Provider: [name] Other Clinician: Referring Provider: Treating Provider/Extender: [name] in Treatment: 7 Constitutional Hypertensive, asymptomatic. . . . No acute distress.. Ears, Nose, Mouth, and  Throat Extremely difficult exam secondary to tortuosity of external auditory canals; the visualized portions of the tympanic membranes are clear.Marland Kitchen Respiratory Normal work of breathing on room air.. . Cardiovascular . Electronic Signature(s) Signed: 05/08/2022 1:03:24 PM By: [name] MD FACS Entered By: [name] on 05/08/2022 13:03:24 -------------------------------------------------------------------------------- Physician Orders Details Patient Name: Date of Service: Nathan [name], Nathan [name] 05/08/2022 12:30 PM Medical Record Number: [id number] Patient Account Number: [id number] Date of Birth/Sex: Treating RN: 24-Apr-1946 (76 y.o. Mare Ferrari Primary Care Provider: [name] Other Clinician: Referring Provider: Treating Provider/Extender: [name] in Treatment: 7 Verbal / Phone Orders: No Diagnosis Coding ICD-10 Coding Code Description 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 Personal history of irradiation C61 Malignant neoplasm of prostate I10 Essential (primary) hypertension I48.0 Paroxysmal atrial fibrillation Follow-up Appointments Return appointment in 1 month. Other: - Dr Celine Ahr. ***Please clean ears -Hydrogen Peroxide and water or Debrox ear cleanser.Thank you Hyperbaric Oxygen Therapy 2.0 ATA for 90 Minutes without A Breaks - 90 Minutes without airbreaks ir Total Number of Treatments: - extend treatments by 20 for total of 60 as of 05/10/22 HBO Treatments 36/40 One treatments per day (delivered Monday through Friday unless otherwise specified in Special Instructions below): Afrin (Oxymetazoline HCL) 0.05% nasal spray - 1 spray in both nostrils daily as needed prior  to HBO treatment for difficulty clearing ears Electronic Signature(s) Signed: 05/08/2022 1:03:52 PM By: [name] MD FACS Nathan [name], Nathan [name] ([id number]) [id number] Page 3 of 6 Entered By: [name] on 05/08/2022 13:03:52 -------------------------------------------------------------------------------- Problem List Details Patient Name: Date of Service: Nathan [name] [name] 05/08/2022 12:30 PM Medical Record Number: [id number] Patient Account Number: [id number] Date of Birth/Sex: Treating RN: 19-Jul-1945 (76 y.o. M) Primary Care Provider: [name] Other Clinician: Referring Provider: Treating Provider/Extender: [name] in Treatment: 7 Active Problems ICD-10 Encounter Code Description Active Date MDM Diagnosis N30.41 Irradiation cystitis with hematuria 03/14/2022 No Yes Z92.3 Personal history of irradiation 03/14/2022 No Yes C61 Malignant neoplasm of prostate 03/14/2022 No Yes I10 Essential (primary) hypertension 03/14/2022 No Yes I48.0 Paroxysmal atrial fibrillation 03/14/2022 No Yes Inactive Problems Resolved Problems Electronic Signature(s) Signed: 05/08/2022 1:00:52 PM By: [name] MD FACS Entered By: [name] on 05/08/2022 13:00:52 -------------------------------------------------------------------------------- Progress Note Details Patient Name: Date of Service: Nathan [name], Nathan [name] 05/08/2022 12:30 PM Medical Record Number: [id number] Patient Account Number: [id number] Date of Birth/Sex: Treating RN: [birth year] (76 y.o. M) Primary Care Provider: [name] Other Clinician: Referring Provider: Treating Provider/Extender: [name] in Treatment: 7 Subjective Chief Complaint Information obtained from Patient Nathan [name], Nathan [name] ([id number]) [id number] Page 4 of 6 Patient presents to the Wound Care center for HBO eval due to radiation  cystitis with hematuria History of Present Illness (HPI) ADMISSION 03/14/2022 This is a 76 year old man with a past medical history significant for prostate cancer. He was treated with radiation therapy and received a total of 70 Gy. His treatment took place between September 16, 2019 and October 23, 2019. He apparently had issues with hematuria even during his treatment phase. He has had a suprapubic catheter placed in the interim. Over the past year, he has had multiple hospitalizations for acute blood loss anemia secondary to hematuria. This is required multiple cystoscopies with clot evacuation and fulguration of bleeding portions of his bladder and prostate. 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 He has also undergone selective embolization of the prostatic artery in an effort  to reduce his bleeding. His urologist has referred him to the wound care center for hyperbaric oxygen therapy. He does have paroxysmal atrial fibrillation but does not take anticoagulation because of his bleeding. He developed a left lower extremity DVT and has had a caval filter placed. He is not diabetic and does not smoke. His most recent EKG (02/13/2022) showed sinus rhythm with some premature atrial complexes. Multiple chest x-rays, including the most recent done on 02/13/2022, show no evidence of cardiopulmonary disease. 10/20; this is a patient who is receiving hyperbaric oxygen for radiation cystitis. He has a suprapubic catheter in place. He is at 27 out of 40 HBO treatments. He states that he is had no evidence of bleeding since he started treatment until this morning. He noticed some discoloration in the Foley bag and then it turned red however that is not continued over the course of today. He did not have any other systemic symptoms. He has tolerated HBO well 05/08/2022: This is a 30-day update. He continues to tolerate his hyperbaric oxygen therapy. He had his Foley catheter  changed out last week. He is unable to tell me whether or not he is still having any hematuria. I examined the urine in his catheter and it appears to be slightly orange-tinted. Patient History Information obtained from Patient. Family History Unknown History. Social History Former smoker - ended on 03/03/2019, Marital Status - Married, Alcohol Use - Never - 3 years ago, Drug Use - No History, Caffeine Use - Daily - Tea. Medical History Hematologic/Lymphatic Patient has history of Anemia Cardiovascular Patient has history of Angina - History, Arrhythmia - Hx A Fib, Hypertension Endocrine Patient has history of Type II Diabetes Hospitalization/Surgery History - Brachytherapy (Prostate cancer);Prostate Artery Embolization (Left) Chest Wall Lipoma removal; Cystoscopy;Marland Kitchen Medical A Surgical History Notes nd Cardiovascular Wolff-Parkinson-White Syndrome Genitourinary Foley Catheter Oncologic Cancer of the prostate Objective Constitutional Hypertensive, asymptomatic. No acute distress.. Vitals Time Taken: 12:44 PM, Height: 73 in, Weight: 183 lbs, BMI: 24.1, Temperature: 97.8 F, Pulse: 85 bpm, Respiratory Rate: 16 breaths/min, Blood Pressure: 170/91 mmHg. Ears, Nose, Mouth, and Throat Extremely difficult exam secondary to tortuosity of external auditory canals; the visualized portions of the tympanic membranes are clear.Marland Kitchen Respiratory Normal work of breathing on room air.. Assessment Active Problems ICD-10 Irradiation cystitis with hematuria Personal history of irradiation Malignant neoplasm of prostate Nathan [name], Nathan [name] ([id number]) [id number] Page 5 of 6 Essential (primary) hypertension Paroxysmal atrial fibrillation Plan Follow-up Appointments: Return appointment in 1 month. Other: - Dr Celine Ahr. 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 Undergoing hyperbaric oxygen therapy. T olerating treatment well. The patient is unable to tell me whether or not he is still having any clots or frank hematuria. Slight orange tinge to urine in catheter bag today. I am going to extend for an additional 20 treatments. We will communicate with urology, as well. Continue treatment as prescribed. Electronic Signature(s) Signed: 05/08/2022 1:05:19 PM By: [name] MD FACS Entered By: [name] on 05/08/2022 13:05:19 --------------------------------------------------------------------------------  HxROS Details Patient Name: Date of Service: Nathan [name] [name] 05/08/2022 12:30 PM Medical Record Number: [id number] Patient Account Number: [id number] Date of Birth/Sex: Treating RN: Mar 27, 1946 (76 y.o. M) Primary Care Provider: [name] Other Clinician: Referring Provider: Treating Provider/Extender: [name] in Treatment: 7 Information Obtained From Patient Hematologic/Lymphatic Medical History: Positive for: Anemia Cardiovascular Medical History: Positive for: Angina - History; Arrhythmia - Hx A Fib; Hypertension Past Medical History Notes: Wolff-Parkinson-White Syndrome Endocrine Medical History: Positive for: Type II Diabetes Genitourinary Medical History: Past Medical History Notes: Foley Catheter Oncologic Medical History: Past Medical History Notes: Cancer of the prostate Nathan [name], Nathan [name] ([id number])  [id number] Page 6 of 6 Immunizations Pneumococcal Vaccine: Received Pneumococcal Vaccination: No Implantable Devices None Hospitalization / Surgery History Type of Hospitalization/Surgery Brachytherapy (Prostate cancer);Prostate Artery Embolization (Left) Chest Wall Lipoma removal; Cystoscopy; Family and Social History Unknown History: Yes; Former smoker - ended on 03/03/2019; Marital Status - Married; Alcohol Use: Never - 3 years ago; Drug Use: No History; Caffeine Use: Daily - T Financial Concerns: No; Food, Clothing or Shelter Needs: No; Support System Lacking: No; Transportation Concerns: No ea; Electronic Signature(s) Signed: 05/08/2022 1:38:29 PM By: [name] MD FACS Entered By: [name] on 05/08/2022 13:02:19 -------------------------------------------------------------------------------- SuperBill Details Patient Name: Date of Service: Nathan [name], Nathan [name] 05/08/2022 Medical Record Number: [id number] Patient Account Number: [id number] Date of Birth/Sex: Treating RN: 1945/07/07 (76 y.o. M) Primary Care Provider: [name] Other Clinician: Referring Provider: Treating Provider/Extender: [name] in Treatment: 7 Diagnosis Coding ICD-10 Codes Code Description 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 Personal history of irradiation C61 Malignant neoplasm of prostate I10 Essential (primary) hypertension I48.0 Paroxysmal atrial fibrillation Facility Procedures : CPT4 Code: [id number] Description: [phone] - WOUND CARE VISIT-LEV 2 EST PT Modifier: 25 Quantity: 1 Physician Procedures : CPT4 Code Description Modifier [id number] - WC PHYS LEVEL 3 - EST PT ICD-10 Diagnosis Description N30.41 Irradiation cystitis with hematuria C61 Malignant neoplasm of prostate Z92.3 Personal history of irradiation I48.0 Paroxysmal atrial  fibrillation Quantity: 1 Electronic Signature(s) Signed: 05/08/2022 3:58:12  PM By: [name] MD FACS Signed: 05/08/2022 4:12:33 PM By: Sharyn Creamer RN, BSN Previous Signature: 05/08/2022 1:05:46 PM Version By: [name] MD FACS Entered By: Sharyn Creamer on 05/08/2022 15:36:23

## 2022-05-08 NOTE — Progress Notes (Signed)
KASHIS, PENLEY ([id number]) [id number] Page 1 of 2 Visit Report for 05/08/2022 Problem List Details Patient Name: Date of Service: Nathan [name] [name] 05/08/2022 1:15 PM Medical Record Number: [id number] Patient Account Number: [id number] Date of Birth/Sex: Treating RN: [birth year] (76 y.o. [name], [name] Primary Care Provider: [name] Other Clinician: [name] Referring Provider: Treating Provider/Extender: [name] in Treatment: 7 Active Problems ICD-10 Encounter Code Description Active Date MDM Diagnosis N30.41 Irradiation cystitis with hematuria 03/14/2022 No Yes Z92.3 Personal history of irradiation 03/14/2022 No Yes C61 Malignant neoplasm of prostate 03/14/2022 No Yes I10 Essential (primary) hypertension 03/14/2022 No Yes I48.0 Paroxysmal atrial fibrillation 03/14/2022 No Yes Inactive Problems Resolved Problems Electronic Signature(s) Signed: 05/08/2022 3:28:29 PM By: [name] EMT Signed: 05/08/2022 3:58:12 PM By: [name] MD FACS Entered By: [name] on 05/08/2022 15:28:28 -------------------------------------------------------------------------------- SuperBill Details Patient Name: Date of Service: [name], [name] 05/08/2022 Medical Record Number: [id number] Patient Account Number: [id number] Date of Birth/Sex: Treating RN: [birth year] (76 y.o. Nathan [name] Primary Care Provider: [name] Other Clinician: [name] Referring Provider: Treating Provider/Extender: [name] in Treatment: 7 Diagnosis [address] 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 I10 Essential (primary) hypertension I48.0 Paroxysmal atrial fibrillation Facility Procedures : CPT4 Code  Description: [id number]-(Facility Use Only) HBOT full body chamber, 52mn , ICD-10 Diagnosis Description 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 Personal history of irradiation C61 Malignant neoplasm of prostate Modifier: Quantity: 4 Physician Procedures : CPT4 Code Description Modifier [id number] PHYS HYPERBARIC OXYGEN THERAPY ICD-10 Diagnosis Description 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 Personal history of irradiation C61 Malignant neoplasm of prostate Quantity: 1 Electronic Signature(s) Signed: 05/08/2022 3:28:22 PM By: GValeria BatmanEMT Signed: 05/08/2022 3:58:12 PM By: CFredirick MaudlinMD FACS Entered By: GValeria Batmanon 05/08/2022 15:28:21

## 2022-05-08 NOTE — Progress Notes (Addendum)
[name], [name] ([id number]) [id number] Page 1 of 6 Visit Report for 05/08/2022 Arrival Information Details Patient Name: Date of Service: Nathan [name] [name] 05/08/2022 12:30 PM Medical Record Number: [id number] Patient Account Number: [id number] Date of Birth/Sex: Treating RN: Jun 30, 1946 (76 y.o. Mare Ferrari Primary Care Ki Luckman: [name] Other Clinician: Referring Lavanna Rog: Treating Martel Galvan/Extender: [name] in Treatment: 7 Visit Information History Since Last Visit Added or deleted any medications: No Patient Arrived: [name] Any new allergies or adverse reactions: No Arrival Time: 12:43 Had a fall or experienced change in No Accompanied By: self activities of daily living that may affect Transfer Assistance: None risk of falls: Patient Identification Verified: Yes Signs or symptoms of abuse/neglect since last visito No Secondary Verification Process Completed: Yes Hospitalized since last visit: No Patient Requires Transmission-Based Precautions: No Implantable device outside of the clinic excluding No Patient Has Alerts: No cellular tissue based products placed in the center since last visit: Pain Present Now: No Electronic Signature(s) Signed: 05/08/2022 4:12:33 PM By: Sharyn Creamer RN, BSN Entered By: Sharyn Creamer on 05/08/2022 12:44:48 -------------------------------------------------------------------------------- Clinic Level of Care Assessment Details Patient Name: Date of Service: [facility], [name] 05/08/2022 12:30 PM Medical Record Number: [id number] Patient Account Number: [id number] Date of Birth/Sex: Treating RN: 28-Aug-1945 (76 y.o. Mare Ferrari Primary Care Requan Hardge: [name] Other Clinician: Referring Christiona Siddique: Treating Sammy Douthitt/Extender: [name] in Treatment: 7 Clinic Level of Care Assessment Items TOOL 3 Quantity Score []$  - 0 Use when EandM  and Procedure is performed on FOLLOW-UP visit ASSESSMENTS - Nursing Assessment / Reassessment X- 1 10 Reassessment of Co-morbidities (includes updates in patient status) X- 1 5 Reassessment of Adherence to Treatment Plan ASSESSMENTS - Wound and Skin Assessment / Reassessment []$  - Points for Wound Assessment can only be taken for a new wound of unknown or different etiology and a procedure is 0 NOT performed to that wound []$  - 0 Simple Wound Assessment / Reassessment - one wound []$  - 0 Complex Wound Assessment / Reassessment - multiple wounds []$  - 0 Dermatologic / Skin Assessment (not related to wound area) ASSESSMENTS - Focused Assessment []$  - 0 Circumferential Edema Measurements - multi extremities MAVIS, PRIESS ([id number]) [id number] Page 2 of 6 []$  - 0 Nutritional Assessment / Counseling / Intervention []$  - 0 Lower Extremity Assessment (monofilament, tuning fork, pulses) []$  - 0 Peripheral Arterial Disease Assessment (using hand held doppler) ASSESSMENTS - Ostomy and/or Continence Assessment and Care []$  - 0 Incontinence Assessment and Management []$  - 0 Ostomy Care Assessment and Management (repouching, etc.) PROCESS - Coordination of Care []$  - Points for Discharge Coordination can only be taken for a new wound of unknown or different etiology and a procedure 0 is NOT performed to that wound X- 1 15 Simple Patient / Family Education for ongoing care []$  - 0 Complex (extensive) Patient / Family Education for ongoing care X- 1 10 Staff obtains Programmer, systems, Records, T Results / Process Orders est []$  - 0 Staff telephones HHA, Nursing Homes / Clarify orders / etc []$  - 0 Routine Transfer to another Facility (non-emergent condition) []$  - 0 Routine Hospital Admission (non-emergent condition) []$  - 0 New Admissions / Biomedical engineer / Ordering NPWT Apligraf, etc. , []$  - 0 Emergency Hospital Admission (emergent condition) []$  - 0 Simple  Discharge Coordination []$  - 0 Complex (extensive) Discharge Coordination PROCESS - Special Needs []$  - 0 Pediatric / Minor Patient Management []$  - 0 Isolation Patient Management []$  -  0 Hearing / Language / Visual special needs []$  - 0 Assessment of Community assistance (transportation, D/C planning, etc.) []$  - 0 Additional assistance / Altered mentation []$  - 0 Support Surface(s) Assessment (bed, cushion, seat, etc.) INTERVENTIONS - Wound Cleansing / Measurement []$  - Points for Wound Cleaning / Measurement, Wound Dressing, Specimen Collection and Specimen taken to lab can only 0 be taken for a new wound of unknown or different etiology and a procedure is NOT performed to that wound []$  - 0 Simple Wound Cleansing - one wound []$  - 0 Complex Wound Cleansing - multiple wounds []$  - 0 Wound Imaging (photographs - any number of wounds) []$  - 0 Wound Tracing (instead of photographs) []$  - 0 Simple Wound Measurement - one wound []$  - 0 Complex Wound Measurement - multiple wounds INTERVENTIONS - Wound Dressings []$  - 0 Small Wound Dressing one or multiple wounds []$  - 0 Medium Wound Dressing one or multiple wounds []$  - 0 Large Wound Dressing one or multiple wounds INTERVENTIONS - Miscellaneous []$  - 0 External ear exam []$  - 0 Specimen Collection (cultures, biopsies, blood, body fluids, etc.) []$  - 0 Specimen(s) / Culture(s) sent or taken to Lab for analysis []$  - 0 Patient Transfer (multiple staff / Harrel Lemon Lift / Similar devices) []$  - 0 Simple Staple / Suture removal (25 or less) SHABAZZ, WEIMAN ([id number]) [id number] Page 3 of 6 []$  - 0 Complex Staple / Suture removal (26 or more) []$  - 0 Hypo / Hyperglycemic Management (close monitor of Blood Glucose) []$  - 0 Ankle / Brachial Index (ABI) - do not check if billed separately X- 1 5 Vital Signs Has the patient been seen at the hospital within the last three years: Yes Total Score: 45 Level Of Care:  New/Established - Level 2 Electronic Signature(s) Signed: 05/08/2022 4:12:33 PM By: Sharyn Creamer RN, BSN Entered By: Sharyn Creamer on 05/08/2022 15:36:07 -------------------------------------------------------------------------------- Encounter Discharge Information Details Patient Name: Date of Service: [name], [name] 05/08/2022 12:30 PM Medical Record Number: [id number] Patient Account Number: [id number] Date of Birth/Sex: Treating RN: May 16, 1946 (76 y.o. Mare Ferrari Primary Care Caitlan Chauca: [name] Other Clinician: Referring Shyla Gayheart: Treating Deboraha Goar/Extender: [name] in Treatment: 7 Encounter Discharge Information Items Discharge Condition: Stable Ambulatory Status: Ambulatory Discharge Destination: Home Transportation: Private Auto Accompanied By: self Schedule Follow-up Appointment: Yes Clinical Summary of Care: Patient Declined Electronic Signature(s) Signed: 05/08/2022 4:12:33 PM By: Sharyn Creamer RN, BSN Entered By: Sharyn Creamer on 05/08/2022 15:37:05 -------------------------------------------------------------------------------- Lower Extremity Assessment Details Patient Name: Date of Service: [facility] DGER, [name] 05/08/2022 12:30 PM Medical Record Number: [id number] Patient Account Number: [id number] Date of Birth/Sex: Treating RN: 1945-11-15 (76 y.o. Mare Ferrari Primary Care Blondie Riggsbee: [name] Other Clinician: Referring Annete Ayuso: Treating Abagale Boulos/Extender: [name] in Treatment: 7 Electronic Signature(s) Signed: 05/08/2022 4:12:33 PM By: Sharyn Creamer RN, BSN Entered By: Sharyn Creamer on 05/08/2022 12:46:23 [name] ([id number] Page 4 of 6 -------------------------------------------------------------------------------- Multi Wound Chart Details Patient Name: Date of Service: Nathan [name] [name] 05/08/2022 12:30 PM Medical  Record Number: [id number] Patient Account Number: [id number] Date of Birth/Sex: Treating RN: January 28, 1946 (76 y.o. M) Primary Care Talena Neira: [name] Other Clinician: Referring Arriah Wadle: Treating Warwick Nick/Extender: [name] in Treatment: 7 Vital Signs Height(in): 73 Pulse(bpm): 85 Weight(lbs): 183 Blood Pressure(mmHg): 170/91 Body Mass Index(BMI): 24.1 Temperature(F): 97.8 Respiratory Rate(breaths/min): 16 [Treatment Notes:Wound Assessments Treatment Notes] Electronic Signature(s) Signed: 05/08/2022 1:00:56 PM By: [name]  MD FACS Entered By: [name] on 05/08/2022 13:00:56 -------------------------------------------------------------------------------- Multi-Disciplinary Care Plan Details Patient Name: Date of Service: Nathan [name] [name] 05/08/2022 12:30 PM Medical Record Number: [id number] Patient Account Number: [id number] Date of Birth/Sex: Treating RN: [birth year] (76 y.o. Mare Ferrari Primary Care Shakeila Pfarr: [name] Other Clinician: Referring Triniti Gruetzmacher: Treating Ingram Onnen/Extender: [name] in Treatment: 7 Active Inactive Electronic Signature(s) Signed: 06/21/2022 3:54:48 PM By: Deon Pilling RN, BSN Signed: 08/20/2022 9:54:16 AM By: Sharyn Creamer RN, BSN Previous Signature: 05/08/2022 4:12:33 PM Version By: Sharyn Creamer RN, BSN Entered By: Deon Pilling on 06/21/2022 15:54:48 -------------------------------------------------------------------------------- Pain Assessment Details Patient Name: Date of Service: [name], [name] 05/08/2022 12:30 PM Medical Record Number: [id number] Patient Account Number: [id number] Date of Birth/Sex: Treating RN: [birth year] (76 y.o. Mare Ferrari Primary Care Cande Mastropietro: [name] Other Clinician: Referring Joyanne Eddinger: Treating Kashon Kraynak/Extender: [name] in Treatment: 7 Active Problems Location of Pain  Severity and Description of Pain Patient Has Paino No Site Locations [place], [place] ([id number]) [id number] Page 5 of 6 Pain Management and Medication Current Pain Management: Electronic Signature(s) Signed: 05/08/2022 4:12:33 PM By: Sharyn Creamer RN, BSN Entered By: Sharyn Creamer on 05/08/2022 12:46:16 -------------------------------------------------------------------------------- Patient/Caregiver Education Details Patient Name: Date of Service: [name], [name] 11/7/2023andnbsp12:30 PM Medical Record Number: [id number] Patient Account Number: [id number] Date of Birth/Gender: Treating RN: 1945-10-10 (76 y.o. Mare Ferrari Primary Care Physician: [name] Other Clinician: Referring Physician: Treating Physician/Extender: [name] in Treatment: 7 Education Assessment Education Provided To: Patient Education Topics Provided Hyperbaric Oxygenation: Methods: Explain/Verbal Responses: State content correctly Electronic Signature(s) Signed: 05/08/2022 4:12:33 PM By: Sharyn Creamer RN, BSN Entered By: Sharyn Creamer on 05/08/2022 13:01:12 -------------------------------------------------------------------------------- Vitals Details Patient Name: Date of Service: [name], [name] 05/08/2022 12:30 PM Medical Record Number: [id number] Patient Account Number: [id number] Date of Birth/Sex: Treating RN: 1946/05/02 (76 y.o. Mare Ferrari Primary Care Moncia Annas: [name] Other Clinician: Referring Muneeb Veras: Treating Mahaley Schwering/Extender: Flory, Steever, Juanda Crumble ([id number]) [id number] Page 6 of 6 Weeks in Treatment: 7 Vital Signs Time Taken: 12:44 Temperature (F): 97.8 Height (in): 73 Pulse (bpm): 85 Weight (lbs): 183 Respiratory Rate (breaths/min): 16 Body Mass Index (BMI): 24.1 Blood Pressure (mmHg): 170/91 Reference Range: 80 - 120 mg /  dl Electronic Signature(s) Signed: 05/08/2022 4:12:33 PM By: Sharyn Creamer RN, BSN Entered By: Sharyn Creamer on 05/08/2022 12:46:07

## 2022-05-08 NOTE — Progress Notes (Signed)
HARSHAN, KEARLEY ([id number]) [id number] Page 1 of 2 Visit Report for 05/08/2022 HBO Details Patient Name: Date of Service: Nathan [name] [name] 05/08/2022 1:15 PM Medical Record Number: [id number] Patient Account Number: [id number] Date of Birth/Sex: Treating RN: 03/09/1946 (76 y.o. 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 HBO Treatment Details Treatment Number: 37 Patient Type: Outpatient Chamber Type: Monoplace Chamber Serial #: [id number] Treatment Protocol: 2.0 ATA with 90 minutes oxygen, and no air breaks Treatment Details Compression Rate Down: 2.0 psi / minute De-Compression Rate Up: 2.0 psi / minute Air breaks and breathing Decompress Decompress Compress Tx Pressure Begins Reached periods Begins Ends (leave unused spaces blank) Chamber Pressure (ATA 1 2 ------2 1 ) Clock Time (24 hr) 13:20 13:27 - - - - - - 14:57 15:06 Treatment Length: 106 (minutes) Treatment Segments: 4 Vital Signs Capillary Blood Glucose Reference Range: 80 - 120 mg / dl HBO Diabetic Blood Glucose Intervention Range: <131 mg/dl or >249 mg/dl Time Vitals Blood Respiratory Capillary Blood Glucose Pulse Action Type: Pulse: Temperature: Taken: Pressure: Rate: Glucose (mg/dl): Meter #: Oximetry (%) Taken: Pre 13:14 179/90 84 16 97.9 Post 15:08 148/88 71 16 97.3 Treatment Response Treatment Toleration: Well Treatment Completion Status: Treatment Completed without Adverse Event Physician HBO Attestation: I certify that I supervised this HBO treatment in accordance with Medicare guidelines. A trained emergency  response team is readily available per Yes hospital policies and procedures. Continue HBOT as ordered. Yes Electronic Signature(s) Signed: 05/08/2022 3:59:48 PM By: [name] MD FACS Previous Signature: 05/08/2022 3:28:02 PM Version By: [name] EMT Entered By: [name] on 05/08/2022 15:59:47 [name] ([id number]) [id number] Page 2 of 2 -------------------------------------------------------------------------------- HBO Safety Checklist Details Patient Name: Date of Service: Nathan [name] [name] 05/08/2022 1:15 PM Medical Record Number: [id number] Patient Account Number: [id number] Date of Birth/Sex: Treating RN: 13-Sep-1945 (76 y.o. [name], [name] Primary Care Elizah Mierzwa: [name] Other Clinician: [name] Referring Dereck Agerton: Treating Elowyn Raupp/Extender: [name] in Treatment: 7 HBO Safety Checklist Items Safety Checklist Consent Form Signed Patient voided / foley secured and emptied When did you last eato 1000 Last dose of injectable or oral agent NA Ostomy pouch emptied and vented if applicable NA All implantable devices assessed, documented and approved foley cath to leg bag Intravenous access site secured and place NA Valuables secured Linens and cotton and cotton/polyester blend (less than 51% polyester) Personal oil-based products / skin lotions / body lotions removed Wigs or hairpieces removed NA Smoking or tobacco materials removed Books / newspapers / magazines / loose paper removed Cologne, aftershave, perfume and deodorant removed Jewelry removed (may wrap wedding band) Make-up removed NA Hair care products removed Battery operated devices (external) removed Heating patches and chemical warmers removed Titanium eyewear removed NA Nail polish cured greater than 10 hours NA Casting material cured greater than 10 hours NA Hearing aids removed NA Loose dentures or partials  removed removed by patient Prosthetics have been removed NA Patient demonstrates correct use of air break device (if applicable) Patient concerns have been addressed Patient grounding bracelet on and cord attached to chamber Specifics for Inpatients (complete in addition to above) Medication sheet sent with patient NA Intravenous medications needed or due during  therapy sent with patient NA Drainage tubes (e.g. nasogastric tube or chest tube secured and vented) NA Endotracheal or Tracheotomy tube secured NA Cuff deflated of air and inflated with saline NA Airway suctioned NA Notes The safety checklist was done before the treatment was started. Electronic Signature(s) Signed: 05/08/2022 3:25:03 PM By: [name] EMT Entered By: [name] on 05/08/2022 15:25:03

## 2022-05-08 NOTE — Progress Notes (Signed)
DOYNE, MICKE ([id number]) [id number] Page 1 of 2 Visit Report for 05/08/2022 Arrival Information Details Patient Name: Date of Service: Nathan [name] [name] 05/08/2022 1:15 PM Medical Record Number: [id number] Patient Account Number: [id number] Date of Birth/Sex: Treating RN: [birth year] (76 y.o. [name], [name] Primary Care Kendrik Mcshan: [name] Other Clinician: [name] Referring Blane Worthington: Treating Jolly Bleicher/Extender: [name] in Treatment: 7 Visit Information History Since Last Visit All ordered tests and consults were completed: Yes Patient Arrived: Ambulatory Added or deleted any medications: No Arrival Time: 13:00 Any new allergies or adverse reactions: No Accompanied By: None Had a fall or experienced change in No Transfer Assistance: None activities of daily living that may affect Patient Identification Verified: Yes risk of falls: Secondary Verification Process Completed: Yes Signs or symptoms of abuse/neglect since last visito No Patient Requires Transmission-Based Precautions: No Hospitalized since last visit: No Patient Has Alerts: No Implantable device outside of the clinic excluding No cellular tissue based products placed in the center since last visit: Pain Present Now: No Electronic Signature(s) Signed: 05/08/2022 3:23:32 PM By: [name] EMT Entered By: [name] on 05/08/2022 15:23:31 -------------------------------------------------------------------------------- Encounter Discharge Information Details Patient Name: Date of Service: [name], [name] 05/08/2022 1:15 PM Medical Record Number: [id number] Patient Account Number: [id number] Date of Birth/Sex: Treating RN: 05/26/1946 (76 y.o. [name] Primary Care Charlotte Brafford: [name] Other Clinician: [name] Referring Kentrail Shew: Treating Yomaira Solar/Extender: [name] in Treatment:  7 Encounter Discharge Information Items Discharge Condition: Stable Ambulatory Status: Ambulatory Discharge Destination: Home Transportation: Private Auto Accompanied By: None Schedule Follow-up Appointment: Yes Clinical Summary of Care: Electronic Signature(s) Signed: 05/08/2022 3:29:02 PM By: [name] EMT Entered By: [name] on 05/08/2022 15:29:02 [name] ([id number]) [id number] Page 2 of 2 -------------------------------------------------------------------------------- Vitals Details Patient Name: Date of Service: Nathan [name] [name] 05/08/2022 1:15 PM Medical Record Number: [id number] Patient Account Number: [id number] Date of Birth/Sex: Treating RN: 06-30-1946 (76 y.o. [name], [name] Primary Care Ayaansh Smail: [name] Other Clinician: [name] Referring Vennesa Bastedo: Treating Germaine Ripp/Extender: [name] in Treatment: 7 Vital Signs Time Taken: 13:14 Temperature (F): 97.9 Height (in): 73 Pulse (bpm): 84 Weight (lbs): 183 Respiratory Rate (breaths/min): 16 Body Mass Index (BMI): 24.1 Blood Pressure (mmHg): 179/90 Reference Range: 80 - 120 mg / dl Electronic Signature(s) Signed: 05/08/2022 3:23:59 PM By: [name] EMT Entered By: [name] on 05/08/2022 15:23:59

## 2022-05-09 ENCOUNTER — Encounter (HOSPITAL_BASED_OUTPATIENT_CLINIC_OR_DEPARTMENT_OTHER): Payer: Medicare Other | Admitting: General Surgery

## 2022-05-09 DIAGNOSIS — N3041 Irradiation cystitis with hematuria: Secondary | ICD-10-CM | POA: Diagnosis not present

## 2022-05-09 NOTE — Progress Notes (Signed)
Nathan [name] ([id number]) [id number] Page 1 of 2 Visit Report for 05/09/2022 Arrival Information Details Patient Name: Date of Service: Nathan [name] [name] 05/09/2022 1:00 PM Medical Record Number: [id number] Patient Account Number: [id number] Date of Birth/Sex: Treating RN: [birth year] (76 y.o. [name], [name] Primary Care Nikki Glanzer: [name] Other Clinician: [name] Referring [name]: Treating [name]/Extender: [name] in Treatment: 8 Visit Information History Since Last Visit All ordered tests and consults were completed: Yes Patient Arrived: [name] Added or deleted any medications: No Arrival Time: 12:35 Any new allergies or adverse reactions: No Accompanied By: None Had a fall or experienced change in No Transfer Assistance: None activities of daily living that may affect Patient Identification Verified: Yes risk of falls: Secondary Verification Process Completed: Yes Signs or symptoms of abuse/neglect since last visito No Patient Requires Transmission-Based Precautions: No Hospitalized since last visit: No Patient Has Alerts: No Implantable device outside of the clinic excluding No cellular tissue based products placed in the center since last visit: Pain Present Now: No Electronic Signature(s) Signed: 05/09/2022 3:54:56 PM By: [name] EMT Previous Signature: 05/09/2022 3:45:22 PM Version By: [name] EMT Entered By: [name] on 05/09/2022 15:54:55 -------------------------------------------------------------------------------- Encounter Discharge Information Details Patient Name: Date of Service: Nathan [name] 05/09/2022 1:00 PM Medical Record Number: [id number] Patient Account Number: [id number] Date of Birth/Sex: Treating RN: 05/14/1946 (76 y.o. [name], [name] Primary Care Claxton Levitz: [name] Other Clinician: [name] Referring Regana Kemple: Treating  Semaya Vida/Extender: [name] in Treatment: 8 Encounter Discharge Information Items Discharge Condition: Stable Ambulatory Status: Cane Discharge Destination: Home Transportation: Private Auto Accompanied By: None Schedule Follow-up Appointment: Yes Clinical Summary of Care: Electronic Signature(s) Signed: 05/09/2022 3:55:12 PM By: [name] EMT Previous Signature: 05/09/2022 3:49:41 PM Version By: [name] EMT Entered By: [name] on 05/09/2022 15:55:12 Nathan [name] ([id number]) [id number] Page 2 of 2 -------------------------------------------------------------------------------- Vitals Details Patient Name: Date of Service: Nathan [name] [name] 05/09/2022 1:00 PM Medical Record Number: [id number] Patient Account Number: [id number] Date of Birth/Sex: Treating RN: 1945-11-06 (76 y.o. [name], [name] Primary Care Laken Lobato: [name] Other Clinician: [name] Referring [name]: Treating Zamia Tyminski/Extender: [name] in Treatment: 8 Vital Signs Time Taken: 12:45 Temperature (F): 98.3 Height (in): 73 Pulse (bpm): 70 Weight (lbs): 183 Respiratory Rate (breaths/min): 18 Body Mass Index (BMI): 24.1 Blood Pressure (mmHg): 162/95 Reference Range: 80 - 120 mg / dl Electronic Signature(s) Signed: 05/09/2022 3:45:54 PM By: [name] EMT Entered By: [name] on 05/09/2022 15:45:53

## 2022-05-09 NOTE — Progress Notes (Signed)
TOREZ, BEAUREGARD ([id number]) [id number] Page 1 of 2 Visit Report for 05/09/2022 Problem List Details Patient Name: Date of Service: Nathan [name] [name] 05/09/2022 1:00 PM Medical Record Number: [id number] Patient Account Number: [id number] Date of Birth/Sex: Treating RN: August 12, 1945 (76 y.o. [name], [name] Primary Care Provider: [name] Other Clinician: [name] Referring Provider: Treating Provider/Extender: [name] in Treatment: 8 Active Problems ICD-10 Encounter Code Description Active Date MDM Diagnosis N30.41 Irradiation cystitis with hematuria 03/14/2022 No Yes Z92.3 Personal history of irradiation 03/14/2022 No Yes C61 Malignant neoplasm of prostate 03/14/2022 No Yes I10 Essential (primary) hypertension 03/14/2022 No Yes I48.0 Paroxysmal atrial fibrillation 03/14/2022 No Yes Inactive Problems Resolved Problems Electronic Signature(s) Signed: 05/09/2022 3:49:10 PM By: [name] EMT Signed: 05/09/2022 3:57:12 PM By: [name] MD FACS Entered By: [name] on 05/09/2022 15:49:09 -------------------------------------------------------------------------------- SuperBill Details Patient Name: Date of Service: Nathan [name], Nathan [name] 05/09/2022 Medical Record Number: [id number] Patient Account Number: [id number] Date of Birth/Sex: Treating RN: 1946/03/18 (76 y.o. [name], [name] Primary Care Provider: [name] Other Clinician: [name] Referring Provider: Treating Provider/Extender: [name] in Treatment: [address] 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

## 2022-05-09 NOTE — Progress Notes (Signed)
DAVION, FLANNERY ([id number]) [id number] Page 1 of 2 Visit Report for 05/09/2022 HBO Details Patient Name: Date of Service: Nathan [name] [name] 05/09/2022 1:00 PM Medical Record Number: [id number] Patient Account Number: [id number] Date of Birth/Sex: Treating RN: 12/01/1945 (76 y.o. 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 HBO Treatment Details Treatment Number: 38 Patient Type: Outpatient Chamber Type: Monoplace Chamber Serial #: [id number] Treatment Protocol: 2.0 ATA with 90 minutes oxygen, and no air breaks Treatment Details Compression Rate Down: 2.0 psi / minute De-Compression Rate Up: 2.0 psi / minute Air breaks and breathing Decompress Decompress Compress Tx Pressure Begins Reached periods Begins Ends (leave unused spaces blank) Chamber Pressure (ATA 1 2 ------2 1 ) Clock Time (24 hr) 12:49 12:56 - - - - - - 14:26 14:35 Treatment Length: 106 (minutes) Treatment Segments: 4 Vital Signs Capillary Blood Glucose Reference Range: 80 - 120 mg / dl HBO Diabetic Blood Glucose Intervention Range: <131 mg/dl or >249 mg/dl Time Vitals Blood Respiratory Capillary Blood Glucose Pulse Action Type: Pulse: Temperature: Taken: Pressure: Rate: Glucose (mg/dl): Meter #: Oximetry (%) Taken: Pre 12:45 162/95 70 18 98.3 Post 14:37 151/87 70 20 98.1 Treatment Response Treatment Toleration: Well Treatment Completion Status: Treatment Completed without Adverse Event Physician HBO Attestation: I certify that I supervised this HBO treatment in accordance with Medicare guidelines. A trained emergency  response team is readily available per Yes hospital policies and procedures. Continue HBOT as ordered. Yes Electronic Signature(s) Signed: 05/09/2022 4:01:40 PM By: [name] MD FACS Previous Signature: 05/09/2022 3:48:44 PM Version By: [name] EMT Entered By: [name] on 05/09/2022 16:01:40 [name] ([id number]) [id number] Page 2 of 2 -------------------------------------------------------------------------------- HBO Safety Checklist Details Patient Name: Date of Service: Nathan [name] [name] 05/09/2022 1:00 PM Medical Record Number: [id number] Patient Account Number: [id number] Date of Birth/Sex: Treating RN: [birth year] (76 y.o. Nathan [name], [name] Primary Care Ashton Belote: [name] Other Clinician: [name] Referring Charrie Mcconnon: Treating Anel Creighton/Extender: [name] in Treatment: 8 HBO Safety Checklist Items Safety Checklist Consent Form Signed Patient voided / foley secured and emptied When did you last eato 0930 Last dose of injectable or oral agent NA Ostomy pouch emptied and vented if applicable NA All implantable devices assessed, documented and approved foley cath to leg bag Intravenous access site secured and place NA Valuables secured Linens and cotton and cotton/polyester blend (less than 51% polyester) Personal oil-based products / skin lotions / body lotions removed Wigs or hairpieces removed NA Smoking or tobacco materials removed Books / newspapers / magazines / loose paper removed Cologne, aftershave, perfume and deodorant removed Jewelry removed (may wrap wedding band) Make-up removed NA Hair care products removed Battery operated devices (external) removed Heating patches and chemical warmers removed Titanium eyewear removed NA Nail polish cured greater than 10 hours NA Casting material cured greater than 10 hours NA Hearing aids removed NA Loose dentures or  partials removed NA Prosthetics have been removed NA Patient demonstrates correct use of air break device (if applicable) Patient concerns have been addressed Patient grounding bracelet on and cord attached to chamber Specifics for Inpatients (complete in addition to above) Medication sheet sent with patient NA Intravenous medications needed or due during therapy sent  with patient NA Drainage tubes (e.g. nasogastric tube or chest tube secured and vented) NA Endotracheal or Tracheotomy tube secured NA Cuff deflated of air and inflated with saline NA Airway suctioned NA Notes The safety checklist was done before the treatment was started. Electronic Signature(s) Signed: 05/09/2022 3:47:21 PM By: [name] EMT Entered By: [name] on 05/09/2022 15:47:21

## 2022-05-10 ENCOUNTER — Encounter (HOSPITAL_BASED_OUTPATIENT_CLINIC_OR_DEPARTMENT_OTHER): Payer: Medicare Other | Admitting: Internal Medicine

## 2022-05-10 DIAGNOSIS — N3041 Irradiation cystitis with hematuria: Secondary | ICD-10-CM | POA: Diagnosis not present

## 2022-05-10 DIAGNOSIS — Z923 Personal history of irradiation: Secondary | ICD-10-CM | POA: Diagnosis not present

## 2022-05-10 DIAGNOSIS — C61 Malignant neoplasm of prostate: Secondary | ICD-10-CM

## 2022-05-10 DIAGNOSIS — I10 Essential (primary) hypertension: Secondary | ICD-10-CM

## 2022-05-10 NOTE — Progress Notes (Addendum)
REMY, VOILES ([id number]) [id number] Page 1 of 2 Visit Report for 05/10/2022 HBO Details Patient Name: Date of Service: Nathan [name] [name] 05/10/2022 1:00 PM Medical Record Number: [id number] Patient Account Number: [id number] Date of Birth/Sex: Treating RN: 05/10/1946 (76 y.o. 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 HBO Treatment Details Treatment Number: 39 Patient Type: Outpatient Chamber Type: Monoplace Chamber Serial #: [id number] Treatment Protocol: 2.0 ATA with 90 minutes oxygen, and no air breaks Treatment Details Compression Rate Down: 2.0 psi / minute De-Compression Rate Up: 2.0 psi / minute Air breaks and breathing Decompress Decompress Compress Tx Pressure Begins Reached periods Begins Ends (leave unused spaces blank) Chamber Pressure (ATA 1 2 ------2 1 ) Clock Time (24 hr) 12:18 12:27 - - - - - - 13:58 14:06 Treatment Length: 108 (minutes) Treatment Segments: 4 Vital Signs Capillary Blood Glucose Reference Range: 80 - 120 mg / dl HBO Diabetic Blood Glucose Intervention Range: <131 mg/dl or >249 mg/dl Type: Time Vitals Blood Respiratory Capillary Blood Glucose Pulse Action Pulse: Temperature: Taken: Pressure: Rate: Glucose (mg/dl): Meter #: Oximetry (%) Taken: Pre 12:14 159/77 70 18 98.2 none per protocol Post 14:08 154/80 63 18 97.3 none per protocol Treatment Response Treatment Toleration: Well Treatment Completion Status: Treatment Completed without Adverse Event Physician HBO Attestation: I certify that I supervised this HBO treatment in accordance with  Medicare guidelines. A trained emergency response team is readily available per Yes hospital policies and procedures. Continue HBOT as ordered. Yes Electronic Signature(s) Signed: 05/10/2022 4:46:25 PM By: Kalman Shan DO Previous Signature: 05/10/2022 4:20:19 PM Version By: [name] CHT EMT BS , , Previous Signature: 05/10/2022 4:44:16 PM Version By: Kalman Shan DO Entered By: Kalman Shan on 05/10/2022 16:45:09 [name] ([id number]) [id number] Page 2 of 2 -------------------------------------------------------------------------------- HBO Safety Checklist Details Patient Name: Date of Service: Nathan [name] [name] 05/10/2022 1:00 PM Medical Record Number: [id number] Patient Account Number: [id number] Date of Birth/Sex: Treating RN: 1945/10/09 (76 y.o. [name] Primary Care Jennea Rager: [name] Other Clinician: [name] Referring Jream Broyles: Treating Britainy Kozub/Extender: [name] in Treatment: 8 HBO Safety Checklist Items Safety Checklist Consent Form Signed Patient voided / foley secured and emptied When did you last eato 0930 Last dose of injectable or oral agent n/a Ostomy pouch emptied and vented if applicable NA All implantable devices assessed, documented and approved NA Intravenous access site secured and place NA Valuables secured Linens and cotton and cotton/polyester blend (less than 51% polyester) Personal oil-based products / skin lotions / body lotions removed Wigs or hairpieces removed NA Smoking or tobacco materials removed NA Books / newspapers / magazines / loose paper removed Cologne, aftershave, perfume and deodorant removed Jewelry removed (may wrap wedding band) Make-up removed NA Hair care products removed Battery operated devices (external) removed Heating patches and chemical warmers removed Titanium eyewear removed Nail polish cured greater than 10  hours NA Casting material cured greater than 10 hours NA Hearing aids removed NA Loose dentures or partials removed dentures removed Prosthetics have been removed NA Patient demonstrates correct use of air break device (if applicable) Patient concerns have been addressed Patient grounding bracelet on and cord attached to chamber Specifics for Inpatients (complete in addition  to above) Medication sheet sent with patient NA Intravenous medications needed or due during therapy sent with patient NA Drainage tubes (e.g. nasogastric tube or chest tube secured and vented) NA Endotracheal or Tracheotomy tube secured NA Cuff deflated of air and inflated with saline NA Airway suctioned NA Notes Paper version used prior to treatment. Electronic Signature(s) Signed: 05/10/2022 3:54:08 PM By: [name] CHT EMT BS , , Previous Signature: 05/10/2022 3:53:52 PM Version By: [name] CHT EMT BS , , Entered By: [name] on 05/10/2022 15:54:08

## 2022-05-10 NOTE — Progress Notes (Signed)
Nathan, [name] ([id number]) [id number] Page 1 of 2 Visit Report for 05/10/2022 Arrival Information Details Patient Name: Date of Service: Nathan [name] [name] 05/10/2022 1:00 PM Medical Record Number: [id number] Patient Account Number: [id number] Date of Birth/Sex: Treating RN: 1946/01/20 (76 y.o. [name] Primary Care Lilian Fuhs: [name] Other Clinician: [name] Referring Zakkiyya Barno: Treating Damiana Berrian/Extender: [name] in Treatment: 8 Visit Information History Since Last Visit All ordered tests and consults were completed: Yes Patient Arrived: Nathan [name] Added or deleted any medications: No Arrival Time: 12:04 Any new allergies or adverse reactions: No Accompanied By: self Had a fall or experienced change in No Transfer Assistance: None activities of daily living that may affect Patient Identification Verified: Yes risk of falls: Secondary Verification Process Completed: Yes Signs or symptoms of abuse/neglect since last visito No Patient Requires Transmission-Based Precautions: No Hospitalized since last visit: No Patient Has Alerts: No Implantable device outside of the clinic excluding No cellular tissue based products placed in the center since last visit: Pain Present Now: No Electronic Signature(s) Signed: 05/10/2022 3:52:01 PM By: [name] CHT EMT BS , , Entered By: [name] on 05/10/2022 15:52:00 -------------------------------------------------------------------------------- Encounter Discharge Information Details Patient Name: Date of Service: Nathan [name], [name] 05/10/2022 1:00 PM Medical Record Number: [id number] Patient Account Number: [id number] Date of Birth/Sex: Treating RN: 1945-11-07 (76 y.o. [name] Primary Care Mariadel Mruk: [name] Other Clinician: [name] Referring Vaden Becherer: Treating Antonios Ostrow/Extender: [name] in Treatment: 8 Encounter Discharge Information Items Discharge Condition: Stable Ambulatory Status: Cane Discharge Destination: Home Transportation: Private Auto Accompanied By: driver Schedule Follow-up Appointment: No Clinical Summary of Care: Electronic Signature(s) Signed: 05/10/2022 4:21:21 PM By: [name] CHT EMT BS , , Entered By: [name] on 05/10/2022 16:21:20 [name] ([id number]) [id number] Page 2 of 2 -------------------------------------------------------------------------------- Vitals Details Patient Name: Date of Service: Nathan [name] [name] 05/10/2022 1:00 PM Medical Record Number: [id number] Patient Account Number: [id number] Date of Birth/Sex: Treating RN: 1946-02-07 (76 y.o. [name] Primary Care Gian Ybarra: [name] Other Clinician: [name] Referring Nathan [name]: Treating Casson Catena/Extender: [name] in Treatment: 8 Vital Signs Time Taken: 12:14 Temperature (F): 98.2 Height (in): 73 Pulse (bpm): 70 Weight (lbs): 183 Respiratory Rate (breaths/min): 18 Body Mass Index (BMI): 24.1 Blood Pressure (mmHg): 159/77 Reference Range: 80 - 120 mg / dl Electronic Signature(s) Signed: 05/10/2022 3:52:23 PM By: [name] CHT EMT BS , , Entered By: [name] on 05/10/2022 15:52:23

## 2022-05-10 NOTE — Progress Notes (Signed)
WESSLEY, EMERT ([id number]) [id number] Page 1 of 1 Visit Report for 05/10/2022 SuperBill Details Patient Name: Date of Service: Nathan [name] [name] 05/10/2022 Medical Record Number: [id number] Patient Account Number: [id number] Date of Birth/Sex: Treating RN: April 30, 1946 (76 y.o. 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

## 2022-05-11 ENCOUNTER — Encounter (HOSPITAL_BASED_OUTPATIENT_CLINIC_OR_DEPARTMENT_OTHER): Payer: Medicare Other | Admitting: General Surgery

## 2022-05-11 DIAGNOSIS — N3041 Irradiation cystitis with hematuria: Secondary | ICD-10-CM | POA: Diagnosis not present

## 2022-05-11 NOTE — Progress Notes (Signed)
LAVIN, PETTEWAY ([id number]) [id number] Page 1 of 1 Visit Report for 05/11/2022 SuperBill Details Patient Name: Date of Service: Nathan [name] [name] 05/11/2022 Medical Record Number: [id number] Patient Account Number: [id number] Date of Birth/Sex: Treating RN: Mar 25, 1946 (76 y.o. 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

## 2022-05-11 NOTE — Progress Notes (Addendum)
BAILEN, GEFFRE ([id number]) [id number] Page 1 of 2 Visit Report for 05/11/2022 HBO Details Patient Name: Date of Service: Nathan [name] [name] 05/11/2022 1:00 PM Medical Record Number: [id number] Patient Account Number: [id number] Date of Birth/Sex: Treating RN: 23-Nov-1945 (76 y.o. 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 HBO Treatment Details Treatment Number: 40 Patient Type: Outpatient Chamber Type: Monoplace Chamber Serial #: [id number] Treatment Protocol: 2.0 ATA with 90 minutes oxygen, and no air breaks Treatment Details Compression Rate Down: 2.0 psi / minute De-Compression Rate Up: 2.0 psi / minute Air breaks and breathing Decompress Decompress Compress Tx Pressure Begins Reached periods Begins Ends (leave unused spaces blank) Chamber Pressure (ATA 1 2 ------2 1 ) Clock Time (24 hr) 12:37 12:46 - - - - - - 14:16 14:24 Treatment Length: 107 (minutes) Treatment Segments: 4 Vital Signs Capillary Blood Glucose Reference Range: 80 - 120 mg / dl HBO Diabetic Blood Glucose Intervention Range: <131 mg/dl or >249 mg/dl Type: Time Vitals Blood Respiratory Capillary Blood Glucose Pulse Action Pulse: Temperature: Taken: Pressure: Rate: Glucose (mg/dl): Meter #: Oximetry (%) Taken: Pre 12:33 174/88 70 20 97.9 none per protocol Post 14:31 166/85 66 20 97.3 none per protocol Treatment Response Treatment Toleration: Well Treatment Completion Status: Treatment Completed without Adverse Event Physician HBO Attestation: I certify that I supervised this HBO treatment in accordance with  Medicare guidelines. A trained emergency response team is readily available per Yes hospital policies and procedures. Continue HBOT as ordered. Yes Electronic Signature(s) Signed: 05/11/2022 4:35:13 PM By: [name] MD FACS Previous Signature: 05/11/2022 2:49:56 PM Version By: [name] CHT EMT BS , , Entered By: [name] on 05/11/2022 [place], Raymund ([id number]) [id number] Page 2 of 2 -------------------------------------------------------------------------------- HBO Safety Checklist Details Patient Name: Date of Service: Nathan [name] [name] 05/11/2022 1:00 PM Medical Record Number: [id number] Patient Account Number: [id number] Date of Birth/Sex: Treating RN: 10-Jun-1946 (76 y.o. [name] Primary Care Calyn Rubi: [name] Other Clinician: [name] Referring Teniola Tseng: Treating Chayim Bialas/Extender: [name] in Treatment: 8 HBO Safety Checklist Items Safety Checklist Consent Form Signed Patient voided / foley secured and emptied When did you last eato 1000 Last dose of injectable or oral agent n/a Ostomy pouch emptied and vented if applicable NA All implantable devices assessed, documented and approved NA Intravenous access site secured and place NA Valuables secured Linens and cotton and cotton/polyester blend (less than 51% polyester) Personal oil-based products / skin lotions / body lotions removed Wigs or hairpieces removed NA Smoking or tobacco materials removed NA Books / newspapers / magazines / loose paper removed Cologne, aftershave, perfume and deodorant removed Jewelry removed (may wrap wedding band) Make-up removed NA Hair care products removed Battery operated devices (external) removed Heating patches and chemical warmers removed Titanium eyewear removed Nail polish cured greater than 10 hours NA Casting material cured greater than 10  hours NA Hearing aids removed NA Loose dentures or partials removed dentures removed Prosthetics have been removed NA Patient demonstrates correct use of air break device (if applicable) Patient concerns have been addressed Patient grounding bracelet on and cord attached to chamber Specifics for Inpatients (complete in addition to above) Medication sheet sent with patient NA Intravenous  medications needed or due during therapy sent with patient NA Drainage tubes (e.g. nasogastric tube or chest tube secured and vented) NA Endotracheal or Tracheotomy tube secured NA Cuff deflated of air and inflated with saline NA Airway suctioned NA Notes Paper version used prior to treatment. Electronic Signature(s) Signed: 05/11/2022 2:20:23 PM By: [name] CHT EMT BS , , Entered By: [name] on 05/11/2022 14:20:23

## 2022-05-11 NOTE — Progress Notes (Signed)
OLNEY, MONIER ([id number]) [id number] Page 1 of 2 Visit Report for 05/11/2022 Arrival Information Details Patient Name: Date of Service: Nathan [name] [name] 05/11/2022 1:00 PM Medical Record Number: [id number] Patient Account Number: [id number] Date of Birth/Sex: Treating RN: [birth year] (76 y.o. Nathan [name] Primary Care Wajiha Versteeg: [name] Other Clinician: [name] Referring Autumn Pruitt: Treating Daksh Coates/Extender: [name] in Treatment: 8 Visit Information History Since Last Visit All ordered tests and consults were completed: Yes Patient Arrived: [name] Added or deleted any medications: No Arrival Time: 12:18 Any new allergies or adverse reactions: No Accompanied By: self Had a fall or experienced change in No Transfer Assistance: None activities of daily living that may affect Patient Identification Verified: Yes risk of falls: Secondary Verification Process Completed: Yes Signs or symptoms of abuse/neglect since last visito No Patient Requires Transmission-Based Precautions: No Hospitalized since last visit: No Patient Has Alerts: No Implantable device outside of the clinic excluding No cellular tissue based products placed in the center since last visit: Pain Present Now: No Electronic Signature(s) Signed: 05/11/2022 2:51:02 PM By: [name] CHT EMT BS , , Previous Signature: 05/11/2022 2:18:46 PM Version By: [name] CHT EMT BS , , Entered By: [name] on 05/11/2022 14:51:01 -------------------------------------------------------------------------------- Encounter Discharge Information Details Patient Name: Date of Service: Nathan [name], [name] 05/11/2022 1:00 PM Medical Record Number: [id number] Patient Account Number: [id number] Date of Birth/Sex: Treating RN: 18-Nov-1945 (76 y.o. Nathan [name] Primary Care Kaitlynne Wenz: [name] Other Clinician: [name] Referring Countess Biebel: Treating Mitchelle Goerner/Extender: [name] in Treatment: 8 Encounter Discharge Information Items Discharge Condition: Stable Ambulatory Status: Cane Discharge Destination: Home Transportation: Private Auto Accompanied By: driver/family member Schedule Follow-up Appointment: No Clinical Summary of Care: Electronic Signature(s) Signed: 05/11/2022 2:50:47 PM By: [name] CHT EMT BS , , Entered By: [name] on 05/11/2022 14:50:47 Nathan [name] ([id number]) [id number] Page 2 of 2 -------------------------------------------------------------------------------- Vitals Details Patient Name: Date of Service: Nathan [name] [name] 05/11/2022 1:00 PM Medical Record Number: [id number] Patient Account Number: [id number] Date of Birth/Sex: Treating RN: 07-May-1946 (76 y.o. Nathan [name] Primary Care Jguadalupe Opiela: [name] Other Clinician: [name] Referring Kaisa Wofford: Treating Sharifa Bucholz/Extender: [name] in Treatment: 8 Vital Signs Time Taken: 12:33 Temperature (F): 97.9 Height (in): 73 Pulse (bpm): 70 Weight (lbs): 183 Respiratory Rate (breaths/min): 20 Body Mass Index (BMI): 24.1 Blood Pressure (mmHg): 174/88 Reference Range: 80 - 120 mg / dl Electronic Signature(s) Signed: 05/11/2022 2:19:16 PM By: [name] CHT EMT BS , , Entered By: [name] on 05/11/2022 14:19:16

## 2022-05-14 ENCOUNTER — Encounter (HOSPITAL_BASED_OUTPATIENT_CLINIC_OR_DEPARTMENT_OTHER): Payer: Medicare Other | Admitting: Internal Medicine

## 2022-05-14 ENCOUNTER — Ambulatory Visit: Payer: Medicare Other | Attending: Internal Medicine | Admitting: Internal Medicine

## 2022-05-14 DIAGNOSIS — N3041 Irradiation cystitis with hematuria: Secondary | ICD-10-CM

## 2022-05-14 DIAGNOSIS — C61 Malignant neoplasm of prostate: Secondary | ICD-10-CM

## 2022-05-14 DIAGNOSIS — Z923 Personal history of irradiation: Secondary | ICD-10-CM

## 2022-05-15 ENCOUNTER — Encounter (HOSPITAL_BASED_OUTPATIENT_CLINIC_OR_DEPARTMENT_OTHER): Payer: Medicare Other | Admitting: Internal Medicine

## 2022-05-15 DIAGNOSIS — C61 Malignant neoplasm of prostate: Secondary | ICD-10-CM | POA: Diagnosis not present

## 2022-05-15 DIAGNOSIS — Z923 Personal history of irradiation: Secondary | ICD-10-CM | POA: Diagnosis not present

## 2022-05-15 DIAGNOSIS — I10 Essential (primary) hypertension: Secondary | ICD-10-CM | POA: Diagnosis not present

## 2022-05-15 DIAGNOSIS — N3041 Irradiation cystitis with hematuria: Secondary | ICD-10-CM

## 2022-05-15 NOTE — Progress Notes (Signed)
JHONNY, CALIXTO ([id number]) [id number] Page 1 of 2 Visit Report for 05/15/2022 HBO Details Patient Name: Date of Service: Nathan [name] [name] 05/15/2022 1:00 PM Medical Record Number: [id number] Patient Account Number: [id number] Date of Birth/Sex: Treating RN: [birth year] (76 y.o. 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 HBO Treatment Details Treatment Number: 42 Patient Type: Outpatient Chamber Type: Monoplace Chamber Serial #: [id number] Treatment Protocol: 2.0 ATA with 90 minutes oxygen, and no air breaks Treatment Details Compression Rate Down: 1.5 psi / minute De-Compression Rate Up: 2.0 psi / minute Air breaks and breathing Decompress Decompress Compress Tx Pressure Begins Reached periods Begins Ends (leave unused spaces blank) Chamber Pressure (ATA 1 2 ------2 1 ) Clock Time (24 hr) 12:41 12:49 - - - - - - 14:19 14:27 Treatment Length: 106 (minutes) Treatment Segments: 4 Vital Signs Capillary Blood Glucose Reference Range: 80 - 120 mg / dl HBO Diabetic Blood Glucose Intervention Range: <131 mg/dl or >249 mg/dl Time Vitals Blood Respiratory Capillary Blood Glucose Pulse Action Type: Pulse: Temperature: Taken: Pressure: Rate: Glucose (mg/dl): Meter #: Oximetry (%) Taken: Pre 12:33 167/97 77 18 97.1 Post 14:29 172/89 70 16 97.3 Treatment Response Treatment Toleration: Well Treatment Completion Status: Treatment Completed without Adverse Event Physician HBO Attestation: I certify that I supervised this HBO treatment in accordance with Medicare guidelines. A trained emergency  response team is readily available per Yes hospital policies and procedures. Continue HBOT as ordered. Yes Electronic Signature(s) Signed: 05/15/2022 6:10:28 PM By: Kalman Shan DO Previous Signature: 05/15/2022 4:08:11 PM Version By: [name] CHT EMT BS , , Previous Signature: 05/15/2022 2:51:32 PM Version By: [name] CHT EMT BS , , Entered By: Kalman Shan on 05/15/2022 16:50:52 [name] ([id number]) [id number] Page 2 of 2 -------------------------------------------------------------------------------- HBO Safety Checklist Details Patient Name: Date of Service: Nathan [name] [name] 05/15/2022 1:00 PM Medical Record Number: [id number] Patient Account Number: [id number] Date of Birth/Sex: Treating RN: Apr 04, 1946 (76 y.o. [name], [name] Primary Care Jarita Raval: [name] Other Clinician: [name] Referring Glendy Barsanti: Treating Shawonda Kerce/Extender: [name] in Treatment: 8 HBO Safety Checklist Items Safety Checklist Consent Form Signed Patient voided / foley secured and emptied When did you last eato 1000 Last dose of injectable or oral agent n/a Ostomy pouch emptied and vented if applicable NA All implantable devices assessed, documented and approved NA Intravenous access site secured and place NA Valuables secured Linens and cotton and cotton/polyester blend (less than 51% polyester) Personal oil-based products / skin lotions / body lotions removed Wigs or hairpieces removed NA Smoking or tobacco materials removed NA Books / newspapers / magazines / loose paper removed Cologne, aftershave, perfume and deodorant removed Jewelry removed (may wrap wedding band) Make-up removed Hair care products removed Battery operated devices (external) removed Heating patches and chemical warmers removed Titanium eyewear removed Nail polish cured greater than 10 hours Casting material cured  greater than 10 hours NA Hearing aids removed NA Loose dentures or partials removed Prosthetics have been removed NA Patient demonstrates correct use of air break device (if applicable) Patient concerns have been addressed Patient grounding bracelet on and cord attached to chamber Specifics for Inpatients (complete in addition to above) Medication sheet sent with  patient NA Intravenous medications needed or due during therapy sent with patient NA Drainage tubes (e.g. nasogastric tube or chest tube secured and vented) NA Endotracheal or Tracheotomy tube secured NA Cuff deflated of air and inflated with saline NA Airway suctioned NA Notes Paper version used prior to treatment. Electronic Signature(s) Signed: 05/15/2022 3:52:31 PM By: [name] CHT EMT BS , , Previous Signature: 05/15/2022 1:11:09 PM Version By: [name] CHT EMT BS , , Entered By: [name] on 05/15/2022 15:52:31

## 2022-05-15 NOTE — Progress Notes (Addendum)
[name], Nathan ([id number]) [id number] Page 1 of 2 Visit Report for 05/15/2022 Arrival Information Details Patient Name: Date of Service: Nathan [name] [name] 05/15/2022 1:00 PM Medical Record Number: [id number] Patient Account Number: [id number] Date of Birth/Sex: Treating RN: 07-07-1945 (76 y.o. [name], [name] Primary Care Artemio Dobie: [name] Other Clinician: [name] Referring Anjelita Sheahan: Treating Dvid Pendry/Extender: [name] in Treatment: 8 Visit Information History Since Last Visit All ordered tests and consults were completed: Yes Patient Arrived: Nathan [name] Added or deleted any medications: No Arrival Time: 12:19 Any new allergies or adverse reactions: No Accompanied By: self Had a fall or experienced change in No Transfer Assistance: None activities of daily living that may affect Patient Identification Verified: Yes risk of falls: Secondary Verification Process Completed: Yes Signs or symptoms of abuse/neglect since last visito No Patient Requires Transmission-Based Precautions: No Hospitalized since last visit: No Patient Has Alerts: No Implantable device outside of the clinic excluding No cellular tissue based products placed in the center since last visit: Pain Present Now: No Electronic Signature(s) Signed: 05/15/2022 2:52:24 PM By: [name] CHT EMT BS , , Previous Signature: 05/15/2022 1:07:29 PM Version By: [name] CHT EMT BS , , Entered By: [name] on 05/15/2022 14:52:23 -------------------------------------------------------------------------------- Encounter Discharge Information Details Patient Name: Date of Service: Nathan [name], [name] 05/15/2022 1:00 PM Medical Record Number: [id number] Patient Account Number: [id number] Date of Birth/Sex: Treating RN: 03/05/1946 (76 y.o. Nathan [name] Primary Care Reegan Bouffard: [name] Other Clinician: [name] Referring Ellaina Schuler: Treating Hazelle Woollard/Extender: [name] in Treatment: 8 Encounter Discharge Information Items Discharge Condition: Stable Ambulatory Status: Cane Discharge Destination: Home Transportation: Private Auto Accompanied By: self Schedule Follow-up Appointment: No Clinical Summary of Care: Electronic Signature(s) Signed: 05/15/2022 2:52:12 PM By: [name] CHT EMT BS , , Entered By: [name] on 05/15/2022 14:52:12 [name] ([id number]) [id number] Page 2 of 2 -------------------------------------------------------------------------------- Vitals Details Patient Name: Date of Service: Nathan [name] [name] 05/15/2022 1:00 PM Medical Record Number: [id number] Patient Account Number: [id number] Date of Birth/Sex: Treating RN: 08-26-1945 (76 y.o. [name], [name] Primary Care Martina Brodbeck: [name] Other Clinician: [name] Referring Clayten Allcock: Treating Oluchi Pucci/Extender: [name] in Treatment: 8 Vital Signs Time Taken: 12:33 Temperature (F): 97.1 Height (in): 73 Pulse (bpm): 77 Weight (lbs): 183 Respiratory Rate (breaths/min): 18 Body Mass Index (BMI): 24.1 Blood Pressure (mmHg): 167/97 Reference Range: 80 - 120 mg / dl Electronic Signature(s) Signed: 05/15/2022 1:08:13 PM By: [name] CHT EMT BS , , Entered By: [name] on 05/15/2022 13:08:13

## 2022-05-15 NOTE — Progress Notes (Signed)
WARNELL, RASNIC ([id number]) [id number] Page 1 of 2 Visit Report for 05/14/2022 HBO Details Patient Name: Date of Service: Nathan [name] Nathan [name] 05/14/2022 1:00 PM Medical Record Number: [id number] Patient Account Number: [id number] Date of Birth/Sex: Treating RN: [birth year] (76 y.o. 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 HBO Treatment Details Treatment Number: 41 Patient Type: Outpatient Chamber Type: Monoplace Chamber Serial #: [id number] Treatment Protocol: 2.0 ATA with 90 minutes oxygen, and no air breaks Treatment Details Compression Rate Down: 2.0 psi / minute De-Compression Rate Up: 2.0 psi / minute Air breaks and breathing Decompress Decompress Compress Tx Pressure Begins Reached periods Begins Ends (leave unused spaces blank) Chamber Pressure (ATA 1 2 ------2 1 ) Clock Time (24 hr) 12:22 12:29 - - - - - - 13:59 14:08 Treatment Length: 106 (minutes) Treatment Segments: 4 Vital Signs Capillary Blood Glucose Reference Range: 80 - 120 mg / dl HBO Diabetic Blood Glucose Intervention Range: <131 mg/dl or >249 mg/dl Time Vitals Blood Respiratory Capillary Blood Glucose Pulse Action Type: Pulse: Temperature: Taken: Pressure: Rate: Glucose (mg/dl): Meter #: Oximetry (%) Taken: Pre 12:18 177/89 78 16 98.1 Post 14:10 147/85 69 16 97.9 Treatment Response Treatment Toleration: Well Treatment Completion Status: Treatment Completed without Adverse Event Treatment Notes late entry due to [facility] charting problems. Entry from paper charting. Physician HBO Attestation: I certify that I  supervised this HBO treatment in accordance with Medicare guidelines. A trained emergency response team is readily available per Yes hospital policies and procedures. Continue HBOT as ordered. Yes Electronic Signature(s) Signed: 05/15/2022 6:10:28 PM By: Kalman Shan DO Previous Signature: 05/15/2022 10:46:06 AM Version By: [name] EMT Entered By: Kalman Shan on 05/15/2022 18:08:33 [name] ([id number]) [id number] Page 2 of 2 -------------------------------------------------------------------------------- HBO Safety Checklist Details Patient Name: Date of Service: Nathan [name] Nathan [name] 05/14/2022 1:00 PM Medical Record Number: [id number] Patient Account Number: [id number] Date of Birth/Sex: Treating RN: [birth year] (76 y.o. Nathan [name] Primary Care Thais Silberstein: [name] Other Clinician: [name] Referring Jupiter Boys: Treating Jenean Escandon/Extender: [name] in Treatment: 8 HBO Safety Checklist Items Safety Checklist Consent Form Signed Patient voided / foley secured and emptied When did you last eato 1030 Last dose of injectable or oral agent NA Ostomy pouch emptied and vented if applicable NA All implantable devices assessed, documented and approved foley cath to leg bag Intravenous access site secured and place NA Valuables secured Linens and cotton and cotton/polyester blend (less than 51% polyester) Personal oil-based products / skin lotions / body lotions removed Wigs or hairpieces removed NA Smoking or tobacco materials removed Books / newspapers / magazines / loose paper removed Cologne, aftershave, perfume and deodorant removed Jewelry removed (may wrap wedding band) Make-up removed NA Hair care products removed Battery operated devices (external) removed Heating patches and chemical warmers removed Titanium eyewear removed NA Nail polish cured greater than 10 hours NA Casting  material cured greater than 10 hours NA Hearing aids removed NA Loose dentures or partials removed removed by patient Prosthetics have been removed NA Patient demonstrates correct use of air break device (if applicable) Patient concerns have been addressed Patient grounding bracelet on and cord attached to chamber Specifics for Inpatients (complete in addition to above)  Medication sheet sent with patient NA Intravenous medications needed or due during therapy sent with patient NA Drainage tubes (e.g. nasogastric tube or chest tube secured and vented) NA Endotracheal or Tracheotomy tube secured NA Cuff deflated of air and inflated with saline NA Airway suctioned NA Notes late entry due to [facility] charting problems. Entry from paper charting. The safety checklist was done before the treatment was started. Electronic Signature(s) Signed: 05/15/2022 10:44:53 AM By: [name] EMT Entered By: [name] on 05/15/2022 10:44:52

## 2022-05-15 NOTE — Progress Notes (Signed)
LEONDRE, TAUL ([id number]) [id number] Page 1 of 2 Visit Report for 05/14/2022 Arrival Information Details Patient Name: Date of Service: Nathan [name] [name] 05/14/2022 1:00 PM Medical Record Number: [id number] Patient Account Number: [id number] Date of Birth/Sex: Treating RN: Jun 06, 1946 (76 y.o. [name] Primary Care Ansh Fauble: [name] Other Clinician: [name] Referring Gabrial Domine: Treating Mekiah Cambridge/Extender: [name] in Treatment: 8 Visit Information History Since Last Visit All ordered tests and consults were completed: Yes Patient Arrived: Ambulatory Added or deleted any medications: No Arrival Time: 12:00 Any new allergies or adverse reactions: No Accompanied By: None Had a fall or experienced change in No Transfer Assistance: None activities of daily living that may affect Patient Identification Verified: Yes risk of falls: Secondary Verification Process Completed: Yes Signs or symptoms of abuse/neglect since last visito No Patient Requires Transmission-Based Precautions: No Hospitalized since last visit: No Patient Has Alerts: No Implantable device outside of the clinic excluding No cellular tissue based products placed in the center since last visit: Pain Present Now: No Notes late entry due to [facility] charting problems. Entry from paper charting. Electronic Signature(s) Signed: 05/15/2022 10:41:40 AM By: [name] EMT Entered By: [name] on 05/15/2022 10:41:40 -------------------------------------------------------------------------------- Encounter Discharge Information Details Patient Name: Date of Service: [name], [name] 05/14/2022 1:00 PM Medical Record Number: [id number] Patient Account Number: [id number] Date of Birth/Sex: Treating RN: June 14, 1946 (76 y.o. [name] Primary Care Zamaria Brazzle: [name] Other Clinician: [name] Referring  Naja Apperson: Treating Carsen Leaf/Extender: [name] in Treatment: 8 Encounter Discharge Information Items Discharge Condition: Stable Ambulatory Status: Ambulatory Discharge Destination: Home Transportation: Private Auto Accompanied By: None Schedule Follow-up Appointment: Yes Clinical Summary of Care: Notes late entry due to [facility] charting problems. Entry from paper charting. Electronic Signature(s) Signed: 05/15/2022 10:47:02 AM By: [name] EMT Entered By: [name] on 05/15/2022 10:47:02 [name] ([id number]) [id number] Page 2 of 2 -------------------------------------------------------------------------------- Vitals Details Patient Name: Date of Service: Nathan [name] [name] 05/14/2022 1:00 PM Medical Record Number: [id number] Patient Account Number: [id number] Date of Birth/Sex: Treating RN: 09-Jun-1946 (76 y.o. [name] Primary Care Rocko Fesperman: [name] Other Clinician: [name] Referring Jacquelina Hewins: Treating Kholton Coate/Extender: [name] in Treatment: 8 Vital Signs Time Taken: 12:18 Temperature (F): 98.1 Height (in): 73 Pulse (bpm): 78 Weight (lbs): 183 Respiratory Rate (breaths/min): 16 Body Mass Index (BMI): 24.1 Blood Pressure (mmHg): 177/89 Reference Range: 80 - 120 mg / dl Notes late entry due to [facility] charting problems. Entry from paper charting. Electronic Signature(s) Signed: 05/15/2022 10:42:13 AM By: [name] EMT Entered By: [name] on 05/15/2022 10:42:13

## 2022-05-16 ENCOUNTER — Encounter (HOSPITAL_BASED_OUTPATIENT_CLINIC_OR_DEPARTMENT_OTHER): Payer: Medicare Other | Admitting: General Surgery

## 2022-05-16 DIAGNOSIS — N3041 Irradiation cystitis with hematuria: Secondary | ICD-10-CM | POA: Diagnosis not present

## 2022-05-16 NOTE — Progress Notes (Signed)
SAVIEN, MAMULA ([id number]) [id number] Page 1 of 2 Visit Report for 05/14/2022 Problem List Details Patient Name: Date of Service: Nathan [name] [name] 05/14/2022 1:00 PM Medical Record Number: [id number] Patient Account Number: [id number] Date of Birth/Sex: Treating RN: [birth year] (76 y.o. [name] Primary Care Provider: [name] Other Clinician: [name] Referring Provider: Treating Provider/Extender: [name] in Treatment: 8 Active Problems ICD-10 Encounter Code Description Active Date MDM Diagnosis N30.41 Irradiation cystitis with hematuria 03/14/2022 No Yes Z92.3 Personal history of irradiation 03/14/2022 No Yes C61 Malignant neoplasm of prostate 03/14/2022 No Yes I10 Essential (primary) hypertension 03/14/2022 No Yes I48.0 Paroxysmal atrial fibrillation 03/14/2022 No Yes Inactive Problems Resolved Problems Electronic Signature(s) Signed: 05/15/2022 10:46:31 AM By: [name] EMT Signed: 05/15/2022 6:10:28 PM By: Kalman Shan DO Entered By: [name] on 05/15/2022 10:46:31 -------------------------------------------------------------------------------- SuperBill Details Patient Name: Date of Service: Nathan [name], Nathan [name] 05/14/2022 Medical Record Number: [id number] Patient Account Number: [id number] Date of Birth/Sex: Treating RN: August 25, 1945 (76 y.o. 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 Personal history of irradiation C61 Malignant neoplasm of prostate I10 Essential (primary) hypertension I48.0 Paroxysmal atrial fibrillation Facility  Procedures : CPT4 Code Description: [id number] G0277-(Facility Use Only) HBOT full body chamber, 23mn , ICD-10 Diagnosis Description 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

## 2022-05-16 NOTE — Progress Notes (Signed)
RAZIEL, KOENIGS ([id number]) [id number] Page 1 of 1 Visit Report for 05/15/2022 SuperBill Details Patient Name: Date of Service: Nathan [name] [name] 05/15/2022 Medical Record Number: [id number] Patient Account Number: [id number] Date of Birth/Sex: Treating RN: 10-06-1945 (76 y.o. 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

## 2022-05-17 ENCOUNTER — Encounter (HOSPITAL_BASED_OUTPATIENT_CLINIC_OR_DEPARTMENT_OTHER): Payer: Medicare Other | Admitting: General Surgery

## 2022-05-17 DIAGNOSIS — N3041 Irradiation cystitis with hematuria: Secondary | ICD-10-CM | POA: Diagnosis not present

## 2022-05-17 NOTE — Progress Notes (Signed)
Nathan [name], Nathan [name] ([id number]) [id number] Page 1 of 2 Visit Report for 05/17/2022 HBO Details Patient Name: Date of Service: Nathan [name] [name] 05/17/2022 1:00 PM Medical Record Number: [id number] Patient Account Number: Nathan [name] Date of Birth/Sex: Treating RN: 01/22/1946 (76 y.o. 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 HBO Treatment Details Treatment Number: 44 Patient Type: Outpatient Chamber Type: Monoplace Chamber Serial #: Nathan [name] Treatment Protocol: 2.0 ATA with 90 minutes oxygen, and no air breaks Treatment Details Compression Rate Down: 2.0 psi / minute De-Compression Rate Up: 2.0 psi / minute Air breaks and breathing Decompress Decompress Compress Tx Pressure Begins Reached periods Begins Ends (leave unused spaces blank) Chamber Pressure (ATA 1 2 ------2 1 ) Clock Time (24 hr) 12:23 12:31 - - - - - - 14:01 14:09 Treatment Length: 106 (minutes) Treatment Segments: 4 Vital Signs Capillary Blood Glucose Reference Range: 80 - 120 mg / dl HBO Diabetic Blood Glucose Intervention Range: <131 mg/dl or >249 mg/dl Type: Time Vitals Blood Respiratory Capillary Blood Glucose Pulse Action Pulse: Temperature: Taken: Pressure: Rate: Glucose (mg/dl): Meter #: Oximetry (%) Taken: Pre 12:17 160/97 71 18 97.3 none per protocol Post 14:10 166/91 70 16 98.1 none per protocol Treatment Response Treatment Toleration: Well Treatment Completion Status: Treatment Completed without Adverse Event Physician HBO Attestation: I certify that I supervised this HBO treatment in accordance with  Medicare guidelines. A trained emergency response team is readily available per Yes hospital policies and procedures. Continue HBOT as ordered. Yes Electronic Signature(s) Signed: 05/17/2022 4:06:14 PM By: Nathan Maudlin MD FACS Previous Signature: 05/17/2022 4:05:06 PM Version By: Nathan [name] CHT EMT BS , , Previous Signature: 05/17/2022 4:04:46 PM Version By: Nathan [name] CHT EMT BS , , Entered By: Nathan [name] on 05/17/2022 16:06:14 Nathan [name] (Nathan [name]) [id number] Page 2 of 2 -------------------------------------------------------------------------------- HBO Safety Checklist Details Patient Name: Date of Service: Nathan [name] [name] 05/17/2022 1:00 PM Medical Record Number: Nathan [name] Patient Account Number: Nathan [name] Date of Birth/Sex: Treating RN: 12/02/1945 (76 y.o. Nathan [name], Nathan [name] Primary Care Nathan [name]: Nathan [name] Other Clinician: [name] Referring Nathan [name]: Treating Nathan [name]/Extender: Nathan [name] Cancer in Treatment: 9 HBO Safety Checklist Items Safety Checklist Consent Form Signed Patient voided / foley secured and emptied When did you last eato 1000 Last dose of injectable or oral agent Ostomy pouch emptied and vented if applicable NA All implantable devices assessed, documented and approved NA Intravenous access site secured and place NA Valuables secured Linens and cotton and cotton/polyester blend (less than 51% polyester) Personal oil-based products / skin lotions / body lotions removed Wigs or hairpieces removed NA Smoking or tobacco materials removed NA Books / newspapers / magazines / loose paper removed Cologne, aftershave, perfume and deodorant removed Jewelry removed (may wrap wedding band) Make-up removed NA Hair care products removed Battery operated devices (external) removed Heating patches and chemical warmers removed Titanium eyewear removed Nail polish  cured greater than 10 hours NA Casting material cured greater than 10 hours NA Hearing aids removed NA Loose dentures or partials removed dentures removed Prosthetics have been removed NA Patient demonstrates correct use of air break device (if applicable) Patient concerns have been addressed Patient grounding bracelet on and cord attached to chamber Specifics for  Inpatients (complete in addition to above) Medication sheet sent with patient NA Intravenous medications needed or due during therapy sent with patient NA Drainage tubes (e.g. nasogastric tube or chest tube secured and vented) NA Endotracheal or Tracheotomy tube secured NA Cuff deflated of air and inflated with saline NA Airway suctioned NA Notes Paper version used prior to treatment. Electronic Signature(s) Signed: 05/17/2022 4:03:53 PM By: Nathan [name] CHT EMT BS , , Entered By: Nathan [name] on 05/17/2022 16:03:53

## 2022-05-17 NOTE — Progress Notes (Signed)
STEAVEN, WHOLEY ([id number]) [id number] Page 1 of 1 Visit Report for 05/16/2022 SuperBill Details Patient Name: Date of Service: Nathan [name] [name] 05/16/2022 Medical Record Number: [id number] Patient Account Number: [id number] Date of Birth/Sex: Treating RN: 06-01-1946 (76 y.o. 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

## 2022-05-17 NOTE — Progress Notes (Signed)
Nathan, [name] ([id number]) [id number] Page 1 of 2 Visit Report for 05/16/2022 Arrival Information Details Patient Name: Date of Service: Nathan [name] Nathan [name] 05/16/2022 1:00 PM Medical Record Number: [id number] Patient Account Number: [id number] Date of Birth/Sex: Treating RN: 07/12/1945 (76 y.o. Nathan [name] Primary Care Nathan [name]: Nathan [name] Nathan [name]: Nathan [name] Referring Nathan [name]: Treating Nathan [name]/Nathan [name] in Treatment: 9 Visit Information History Since Last Visit All ordered tests and consults were completed: Yes Patient Arrived: Nathan [name] Added or deleted any medications: No Arrival Time: 12:44 Any new allergies or adverse reactions: No Accompanied By: self Had a fall or experienced change in No Transfer Assistance: None activities of daily living that may affect Patient Identification Verified: Yes risk of falls: Secondary Verification Process Completed: Yes Signs or symptoms of abuse/neglect since last visito No Patient Requires Transmission-Based Precautions: No Hospitalized since last visit: No Patient Has Alerts: No Implantable device outside of the clinic excluding No cellular tissue based products placed in the center since last visit: Pain Present Now: No Electronic Signature(s) Signed: 05/16/2022 4:52:51 PM By: Nathan [name] CHT EMT BS , , Previous Signature: 05/16/2022 4:47:09 PM Version By: Nathan [name] CHT EMT BS , , Entered By: Nathan [name] on 05/16/2022 16:52:50 -------------------------------------------------------------------------------- Encounter Discharge Information Details Patient Name: Date of Service: Nathan [name], Nathan [name] 05/16/2022 1:00 PM Medical Record Number: [id number] Patient Account Number: [id number] Date of Birth/Sex: Treating RN: 1945-07-17 (76 y.o. Nathan [name] Primary Care Nathan [name]: Nathan [name] Nathan [name]: Nathan [name] Referring Nathan [name]: Treating Nathan [name]/Nathan [name] in Treatment: 9 Encounter Discharge Information Items Discharge Condition: Stable Ambulatory Status: Cane Discharge Destination: Home Transportation: Private Auto Accompanied By: self Schedule Follow-up Appointment: No Clinical Summary of Care: Electronic Signature(s) Signed: 05/16/2022 4:52:38 PM By: Nathan [name] CHT EMT BS , , Entered By: Nathan [name] on 05/16/2022 16:52:38 Nathan [name] ([id number]) [id number] Page 2 of 2 -------------------------------------------------------------------------------- Vitals Details Patient Name: Date of Service: Nathan [name] Nathan [name] 05/16/2022 1:00 PM Medical Record Number: [id number] Patient Account Number: [id number] Date of Birth/Sex: Treating RN: Mar 03, 1946 (76 y.o. Nathan [name] Primary Care Nathan [name]: Nathan [name] Nathan [name]: Nathan [name] Referring Nathan [name]: Treating Nathan [name]/Nathan [name] in Treatment: 9 Vital Signs Time Taken: 12:57 Temperature (F): 97.8 Weight (lbs): 183 Pulse (bpm): 72 Respiratory Rate (breaths/min): 20 Blood Pressure (mmHg): 161/89 Reference Range: 80 - 120 mg / dl Electronic Signature(s) Signed: 05/16/2022 4:47:39 PM By: Nathan [name] CHT EMT BS , , Entered By: Nathan [name] on 05/16/2022 16:47:38

## 2022-05-17 NOTE — Progress Notes (Signed)
BARTLETT, ENKE ([id number]) [id number] Page 1 of 1 Visit Report for 05/17/2022 SuperBill Details Patient Name: Date of Service: Nathan [name] [name] 05/17/2022 Medical Record Number: [id number] Patient Account Number: [id number] Date of Birth/Sex: Treating RN: April 25, 1946 (76 y.o. 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

## 2022-05-17 NOTE — Progress Notes (Signed)
MARVELLE, CAUDILL ([id number]) [id number] Page 1 of 2 Visit Report for 05/17/2022 Arrival Information Details Patient Name: Date of Service: Nathan [name] [name] 05/17/2022 1:00 PM Medical Record Number: [id number] Patient Account Number: [id number] Date of Birth/Sex: Treating RN: May 10, 1946 (76 y.o. Nathan [name], [name] Primary Care Pierrette Scheu: [name] Other Clinician: [name] Referring Eswin Worrell: Treating Charlotte Brafford/Extender: [name] in Treatment: 9 Visit Information History Since Last Visit All ordered tests and consults were completed: Yes Patient Arrived: Nathan [name] Added or deleted any medications: No Arrival Time: 12:03 Any new allergies or adverse reactions: No Accompanied By: self Had a fall or experienced change in No Transfer Assistance: None activities of daily living that may affect Patient Identification Verified: Yes risk of falls: Secondary Verification Process Completed: Yes Signs or symptoms of abuse/neglect since last visito No Patient Requires Transmission-Based Precautions: No Hospitalized since last visit: No Patient Has Alerts: No Implantable device outside of the clinic excluding No cellular tissue based products placed in the center since last visit: Pain Present Now: No Electronic Signature(s) Signed: 05/17/2022 4:05:38 PM By: [name] CHT EMT BS , , Previous Signature: 05/17/2022 4:02:32 PM Version By: [name] CHT EMT BS , , Entered By: [name] on 05/17/2022 16:05:38 -------------------------------------------------------------------------------- Encounter Discharge Information Details Patient Name: Date of Service: Nathan [name], [name] 05/17/2022 1:00 PM Medical Record Number: [id number] Patient Account Number: [id number] Date of Birth/Sex: Treating RN: June 27, 1946 (76 y.o. Nathan [name], [name] Primary Care [name]: [name] Other Clinician: [name] Referring Kariel Skillman: Treating [name]/Extender: [name] in Treatment: 9 Encounter Discharge Information Items Discharge Condition: Stable Ambulatory Status: Cane Discharge Destination: Home Transportation: Private Auto Accompanied By: driver Schedule Follow-up Appointment: No Clinical Summary of Care: Electronic Signature(s) Signed: 05/17/2022 4:05:59 PM By: [name] CHT EMT BS , , Entered By: [name] on 05/17/2022 16:05:59 [name] ([id number]) [id number] Page 2 of 2 -------------------------------------------------------------------------------- Vitals Details Patient Name: Date of Service: Nathan [name] [name] 05/17/2022 1:00 PM Medical Record Number: [id number] Patient Account Number: [id number] Date of Birth/Sex: Treating RN: August 17, 1945 (76 y.o. Nathan [name], [name] Primary Care [name]: [name] Other Clinician: [name] Referring Rylynne Schicker: Treating [name]/Extender: [name] in Treatment: 9 Vital Signs Time Taken: 12:17 Temperature (F): 97.3 Weight (lbs): 183 Pulse (bpm): 71 Respiratory Rate (breaths/min): 18 Blood Pressure (mmHg): 160/97 Reference Range: 80 - 120 mg / dl Electronic Signature(s) Signed: 05/17/2022 4:03:00 PM By: [name] CHT EMT BS , , Entered By: [name] on 05/17/2022 16:03:00

## 2022-05-17 NOTE — Progress Notes (Signed)
CORDAI, RODRIGUE ([id number]) [id number] Page 1 of 2 Visit Report for 05/16/2022 HBO Details Patient Name: Date of Service: Nathan [name] [name] 05/16/2022 1:00 PM Medical Record Number: [id number] Patient Account Number: [id number] Date of Birth/Sex: Treating RN: 02/04/1946 (76 y.o. 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 HBO Treatment Details Treatment Number: 43 Patient Type: Outpatient Chamber Type: Monoplace Chamber Serial #: [id number] Treatment Protocol: 2.0 ATA with 90 minutes oxygen, and no air breaks Treatment Details Compression Rate Down: 1.5 psi / minute De-Compression Rate Up: 2.0 psi / minute Air breaks and breathing Decompress Decompress Compress Tx Pressure Begins Reached periods Begins Ends (leave unused spaces blank) Chamber Pressure (ATA 1 2 ------2 1 ) Clock Time (24 hr) 13:03 13:16 - - - - - - 14:47 14:55 Treatment Length: 112 (minutes) Treatment Segments: 4 Vital Signs Capillary Blood Glucose Reference Range: 80 - 120 mg / dl HBO Diabetic Blood Glucose Intervention Range: <131 mg/dl or >249 mg/dl Type: Time Vitals Blood Respiratory Capillary Blood Glucose Pulse Action Pulse: Temperature: Taken: Pressure: Rate: Glucose (mg/dl): Meter #: Oximetry (%) Taken: Pre 12:57 161/89 72 20 97.8 none per protocol Post 14:58 165/95 69 18 97.5 none per protocol Treatment Response Treatment Toleration: Well Treatment Completion Status: Treatment Completed without Adverse Event Physician HBO Attestation: I certify that I supervised this HBO treatment in accordance with  Medicare guidelines. A trained emergency response team is readily available per Yes hospital policies and procedures. Continue HBOT as ordered. Yes Electronic Signature(s) Signed: 05/16/2022 5:16:41 PM By: [name] MD FACS Previous Signature: 05/16/2022 4:49:32 PM Version By: [name] CHT EMT BS , , Entered By: [name] on 05/16/2022 17:16:41 [name] ([id number]) [id number] Page 2 of 2 -------------------------------------------------------------------------------- HBO Safety Checklist Details Patient Name: Date of Service: Nathan [name] [name] 05/16/2022 1:00 PM Medical Record Number: [id number] Patient Account Number: [id number] Date of Birth/Sex: Treating RN: [birth year] (76 y.o. [name] Primary Care Cayce Paschal: [name] Other Clinician: [name] Referring Gaege Sangalang: Treating Norton Bivins/Extender: [name] in Treatment: 9 HBO Safety Checklist Items Safety Checklist Consent Form Signed Patient voided / foley secured and emptied When did you last eato 1000 Last dose of injectable or oral agent n/a Ostomy pouch emptied and vented if applicable NA All implantable devices assessed, documented and approved NA Intravenous access site secured and place NA Valuables secured Linens and cotton and cotton/polyester blend (less than 51% polyester) Personal oil-based products / skin lotions / body lotions removed Wigs or hairpieces removed NA Smoking or tobacco materials removed NA Books / newspapers / magazines / loose paper removed Cologne, aftershave, perfume and deodorant removed Jewelry removed (may wrap wedding band) Make-up removed Hair care products removed Battery operated devices (external) removed Heating patches and chemical warmers removed Titanium eyewear removed Nail polish cured greater than 10 hours NA Casting material cured greater than 10 hours NA Hearing  aids removed NA Loose dentures or partials removed dentures removed Prosthetics have been removed NA Patient demonstrates correct use of air break device (if applicable) Patient concerns have been addressed Patient grounding bracelet on and cord attached to chamber Specifics for Inpatients (complete in addition to above) Medication sheet sent with patient NA Intravenous medications  needed or due during therapy sent with patient NA Drainage tubes (e.g. nasogastric tube or chest tube secured and vented) NA Endotracheal or Tracheotomy tube secured NA Cuff deflated of air and inflated with saline NA Airway suctioned NA Notes Paper version used prior to treatment. Electronic Signature(s) Signed: 05/16/2022 4:48:39 PM By: [name] CHT EMT BS , , Entered By: [name] on 05/16/2022 16:48:39

## 2022-05-18 ENCOUNTER — Encounter (HOSPITAL_BASED_OUTPATIENT_CLINIC_OR_DEPARTMENT_OTHER): Payer: Medicare Other | Admitting: General Surgery

## 2022-05-18 DIAGNOSIS — N3041 Irradiation cystitis with hematuria: Secondary | ICD-10-CM | POA: Diagnosis not present

## 2022-05-18 NOTE — Progress Notes (Signed)
Nathan [name] ([id number]) [id number] Page 1 of 2 Visit Report for 05/18/2022 HBO Details Patient Name: Date of Service: Nathan [name] [name] 05/18/2022 1:00 PM Medical Record Number: [id number] Patient Account Number: [id number] Date of Birth/Sex: Treating RN: 02-19-1946 (76 y.o. 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 HBO Treatment Details Treatment Number: 45 Patient Type: Outpatient Chamber Type: Monoplace Chamber Serial #: [id number] Treatment Protocol: 2.0 ATA with 90 minutes oxygen, and no air breaks Treatment Details Compression Rate Down: 2.0 psi / minute De-Compression Rate Up: 2.0 psi / minute Air breaks and breathing Decompress Decompress Compress Tx Pressure Begins Reached periods Begins Ends (leave unused spaces blank) Chamber Pressure (ATA 1 2 ------2 1 ) Clock Time (24 hr) 12:33 12:43 - - - - - - 14:13 14:20 Treatment Length: 107 (minutes) Treatment Segments: 4 Vital Signs Capillary Blood Glucose Reference Range: 80 - 120 mg / dl HBO Diabetic Blood Glucose Intervention Range: <131 mg/dl or >249 mg/dl Time Vitals Blood Respiratory Capillary Blood Glucose Pulse Action Type: Pulse: Temperature: Taken: Pressure: Rate: Glucose (mg/dl): Meter #: Oximetry (%) Taken: Pre 12:28 183/92 83 20 98 Post 14:25 162/87 76 18 98.2 Treatment Response Treatment Toleration: Well Treatment Completion Status: Treatment Completed without Adverse Event Physician HBO Attestation: I certify that I supervised this HBO treatment in accordance with Medicare guidelines. A trained emergency  response team is readily available per Yes hospital policies and procedures. Continue HBOT as ordered. Yes Electronic Signature(s) Signed: 05/18/2022 5:11:30 PM By: [name] MD FACS Previous Signature: 05/18/2022 2:30:57 PM Version By: [name] EMT Entered By: [name] on 05/18/2022 17:11:30 [name] ([id number]) [id number] Page 2 of 2 -------------------------------------------------------------------------------- HBO Safety Checklist Details Patient Name: Date of Service: Nathan [name] [name] 05/18/2022 1:00 PM Medical Record Number: [id number] Patient Account Number: [id number] Date of Birth/Sex: Treating RN: 09-29-1945 (76 y.o. [name] Primary Care Estephany Perot: [name] Other Clinician: [name] Referring Levada Bowersox: Treating Tiearra Colwell/Extender: [name] in Treatment: 9 HBO Safety Checklist Items Safety Checklist Consent Form Signed Patient voided / foley secured and emptied When did you last eato 1000 Last dose of injectable or oral agent NA Ostomy pouch emptied and vented if applicable NA All implantable devices assessed, documented and approved foley cath to leg bag Intravenous access site secured and place NA Valuables secured Linens and cotton and cotton/polyester blend (less than 51% polyester) Personal oil-based products / skin lotions / body lotions removed Wigs or hairpieces removed NA Smoking or tobacco materials removed Books / newspapers / magazines / loose paper removed Cologne, aftershave, perfume and deodorant removed Jewelry removed (may wrap wedding band) Make-up removed NA Hair care products removed Battery operated devices (external) removed Heating patches and chemical warmers removed Titanium eyewear removed NA Nail polish cured greater than 10 hours NA Casting material cured greater than 10 hours NA Hearing aids removed NA Loose dentures or  partials removed removed by patient Prosthetics have been removed NA Patient demonstrates correct use of air break device (if applicable) Patient concerns have been addressed Patient grounding bracelet on and cord attached to chamber Specifics for Inpatients (complete in addition to above) Medication sheet sent with patient NA Intravenous medications needed or due during  therapy sent with patient NA Drainage tubes (e.g. nasogastric tube or chest tube secured and vented) NA Endotracheal or Tracheotomy tube secured NA Cuff deflated of air and inflated with saline NA Airway suctioned NA Notes The safety check was done before the treatment was started. Electronic Signature(s) Signed: 05/18/2022 2:29:39 PM By: [name] EMT Entered By: [name] on 05/18/2022 14:29:38

## 2022-05-18 NOTE — Progress Notes (Signed)
CARSTON, RIEDL ([id number]) [id number] Page 1 of 2 Visit Report for 05/18/2022 Arrival Information Details Patient Name: Date of Service: Nathan [name] [name] 05/18/2022 1:00 PM Medical Record Number: [id number] Patient Account Number: [id number] Date of Birth/Sex: Treating RN: 12-30-1945 (76 y.o. [name] Primary Care Aryn Safran: [name] Other Clinician: [name] Referring Nelida Mandarino: Treating Shera Laubach/Extender: [name] in Treatment: 9 Visit Information History Since Last Visit All ordered tests and consults were completed: Yes Patient Arrived: Ambulatory Added or deleted any medications: No Arrival Time: 12:17 Any new allergies or adverse reactions: No Accompanied By: None Had a fall or experienced change in No Transfer Assistance: None activities of daily living that may affect Patient Identification Verified: Yes risk of falls: Secondary Verification Process Completed: Yes Signs or symptoms of abuse/neglect since last visito No Patient Requires Transmission-Based Precautions: No Hospitalized since last visit: No Patient Has Alerts: No Implantable device outside of the clinic excluding No cellular tissue based products placed in the center since last visit: Pain Present Now: No Electronic Signature(s) Signed: 05/18/2022 2:28:11 PM By: [name] EMT Entered By: [name] on 05/18/2022 14:28:11 -------------------------------------------------------------------------------- Encounter Discharge Information Details Patient Name: Date of Service: Nathan [name], Nathan [name] 05/18/2022 1:00 PM Medical Record Number: [id number] Patient Account Number: [id number] Date of Birth/Sex: Treating RN: 02-Oct-1945 (76 y.o. [name] Primary Care Joseeduardo Brix: [name] Other Clinician: [name] Referring Tavish Gettis: Treating Laelia Angelo/Extender: [name] in  Treatment: 9 Encounter Discharge Information Items Discharge Condition: Stable Ambulatory Status: Ambulatory Discharge Destination: Home Transportation: Private Auto Accompanied By: None Schedule Follow-up Appointment: Yes Clinical Summary of Care: Electronic Signature(s) Signed: 05/18/2022 2:32:46 PM By: [name] EMT Entered By: [name] on 05/18/2022 14:32:46 Nathan [name] ([id number]) [id number] Page 2 of 2 -------------------------------------------------------------------------------- Vitals Details Patient Name: Date of Service: Nathan [name] [name] 05/18/2022 1:00 PM Medical Record Number: [id number] Patient Account Number: [id number] Date of Birth/Sex: Treating RN: 1946-03-26 (76 y.o. [name] Primary Care Derrious Bologna: [name] Other Clinician: [name] Referring Calyx Hawker: Treating Aidyn Kellis/Extender: [name] in Treatment: 9 Vital Signs Time Taken: 12:28 Temperature (F): 98.0 Weight (lbs): 183 Pulse (bpm): 83 Respiratory Rate (breaths/min): 20 Blood Pressure (mmHg): 183/92 Reference Range: 80 - 120 mg / dl Electronic Signature(s) Signed: 05/18/2022 2:28:39 PM By: [name] EMT Entered By: [name] on 05/18/2022 14:28:38

## 2022-05-19 NOTE — Progress Notes (Signed)
WILLSON, LIPA ([id number]) [id number] Page 1 of 2 Visit Report for 05/18/2022 Problem List Details Patient Name: Date of Service: Nathan [name] [name] 05/18/2022 1:00 PM Medical Record Number: [id number] Patient Account Number: [id number] Date of Birth/Sex: Treating RN: August 26, 1945 (76 y.o. 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 No Yes Z92.3 Personal history of irradiation 03/14/2022 No Yes C61 Malignant neoplasm of prostate 03/14/2022 No Yes I10 Essential (primary) hypertension 03/14/2022 No Yes I48.0 Paroxysmal atrial fibrillation 03/14/2022 No Yes Inactive Problems Resolved Problems Electronic Signature(s) Signed: 05/18/2022 2:31:20 PM By: [name] EMT Signed: 05/18/2022 5:13:10 PM By: [name] MD FACS Entered By: [name] on 05/18/2022 14:31:20 -------------------------------------------------------------------------------- SuperBill Details Patient Name: Date of Service: [name], [name] 05/18/2022 Medical Record Number: [id number] Patient Account Number: [id number] Date of Birth/Sex: Treating RN: December 27, 1945 (76 y.o. 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 I10 Essential (primary) hypertension I48.0 Paroxysmal atrial fibrillation Facility  Procedures : CPT4 Code Description: [id number] G0277-(Facility Use Only) HBOT full body chamber, 70mn , ICD-10 Diagnosis Description 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

## 2022-05-21 ENCOUNTER — Encounter (HOSPITAL_BASED_OUTPATIENT_CLINIC_OR_DEPARTMENT_OTHER): Payer: Medicare Other | Admitting: Internal Medicine

## 2022-05-21 DIAGNOSIS — N3041 Irradiation cystitis with hematuria: Secondary | ICD-10-CM | POA: Diagnosis not present

## 2022-05-21 DIAGNOSIS — C61 Malignant neoplasm of prostate: Secondary | ICD-10-CM

## 2022-05-21 DIAGNOSIS — Z923 Personal history of irradiation: Secondary | ICD-10-CM

## 2022-05-21 NOTE — Progress Notes (Signed)
Nathan [name], Nathan [name] ([id number]) [id number] Page 1 of 2 Visit Report for 05/21/2022 HBO Details Patient Name: Date of Service: Nathan [name] [name] 05/21/2022 1:00 PM Medical Record Number: [id number] Patient Account Number: [id number] Date of Birth/Sex: Treating RN: March 28, 1946 (76 y.o. 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 HBO Treatment Details Treatment Number: 46 Patient Type: Outpatient Chamber Type: Monoplace Chamber Serial #: [id number] Treatment Protocol: 2.0 ATA with 90 minutes oxygen, and no air breaks Treatment Details Compression Rate Down: 2.0 psi / minute De-Compression Rate Up: 2.0 psi / minute Air breaks and breathing Decompress Decompress Compress Tx Pressure Begins Reached periods Begins Ends (leave unused spaces blank) Chamber Pressure (ATA 1 2 ------2 1 ) Clock Time (24 hr) 13:04 13:13 - - - - - - 14:42 14:51 Treatment Length: 107 (minutes) Treatment Segments: 4 Vital Signs Capillary Blood Glucose Reference Range: 80 - 120 mg / dl HBO Diabetic Blood Glucose Intervention Range: <131 mg/dl or >249 mg/dl Time Vitals Blood Respiratory Capillary Blood Glucose Pulse Action Type: Pulse: Temperature: Taken: Pressure: Rate: Glucose (mg/dl): Meter #: Oximetry (%) Taken: Pre 12:47 180/88 85 16 97.4 Post 14:53 143/81 68 16 97.2 Treatment Response Treatment Toleration: Well Treatment Completion Status: Treatment Completed without Adverse Event Physician HBO Attestation: I certify that I supervised this HBO treatment in accordance with Medicare guidelines. A trained emergency  response team is readily available per Yes hospital policies and procedures. Continue HBOT as ordered. Yes Electronic Signature(s) Unsigned Previous Signature: 05/21/2022 3:08:30 PM Version By: [name] EMT Entered By: Kalman Shan on 05/21/2022 15:50:09 Signature(s): Nathan [name], Nathan [name] ([id number]) [id number] Date(s): [id number] Page 2 of 2 -------------------------------------------------------------------------------- HBO Safety Checklist Details Patient Name: Date of Service: Nathan [name] [name] 05/21/2022 1:00 PM Medical Record Number: [id number] Patient Account Number: [id number] Date of Birth/Sex: Treating RN: 1946-04-25 (76 y.o. Nathan [name] Session Primary Care Kailani Brass: [name] Other Clinician: [name] Referring Yailine Ballard: Treating Crystian Frith/Extender: [name] in Treatment: 9 HBO Safety Checklist Items Safety Checklist Consent Form Signed Patient voided / foley secured and emptied When did you last eato 0930 Last dose of injectable or oral agent NA Ostomy pouch emptied and vented if applicable NA All implantable devices assessed, documented and approved NA Intravenous access site secured and place NA Valuables secured Linens and cotton and cotton/polyester blend (less than 51% polyester) Personal oil-based products / skin lotions / body lotions removed Wigs or hairpieces removed NA Smoking or tobacco materials removed Books / newspapers / magazines / loose paper removed Cologne, aftershave, perfume and deodorant removed Jewelry removed (may wrap wedding band) Make-up removed NA Hair care products removed Battery operated devices (external) removed Heating patches and chemical warmers removed Titanium eyewear removed NA Nail polish cured greater than 10 hours NA Casting material cured greater than 10 hours NA Hearing aids removed NA Loose dentures or partials removed Prosthetics have been  removed NA Patient demonstrates correct use of air break device (if applicable) Patient concerns have been addressed Patient grounding bracelet on and cord attached to chamber Specifics for Inpatients (complete in addition to above) Medication sheet sent with patient NA Intravenous medications needed or due during therapy sent with patient NA Drainage tubes (e.g. nasogastric tube or chest  tube secured and vented) NA Endotracheal or Tracheotomy tube secured NA Cuff deflated of air and inflated with saline NA Airway suctioned NA Notes The safety check was done before the treatment was started. Electronic Signature(s) Signed: 05/21/2022 3:07:18 PM By: [name] EMT Entered By: [name] on 05/21/2022 15:07:17

## 2022-05-21 NOTE — Progress Notes (Signed)
TYRIC, RODEHEAVER ([id number]) [id number] Page 1 of 2 Visit Report for 05/21/2022 Arrival Information Details Patient Name: Date of Service: Nathan [name] [name] 05/21/2022 1:00 PM Medical Record Number: [id number] Patient Account Number: [id number] Date of Birth/Sex: Treating RN: [birth year] (76 y.o. [name] Primary Care Arrick Dutton: [name] Other Clinician: [name] Referring Tonna Palazzi: Treating Kayden Hutmacher/Extender: [name] in Treatment: 9 Visit Information History Since Last Visit All ordered tests and consults were completed: Yes Patient Arrived: Ambulatory Added or deleted any medications: No Arrival Time: 11:56 Any new allergies or adverse reactions: No Accompanied By: None Had a fall or experienced change in No Transfer Assistance: None activities of daily living that may affect Patient Identification Verified: Yes risk of falls: Secondary Verification Process Completed: Yes Signs or symptoms of abuse/neglect since last visito No Patient Requires Transmission-Based Precautions: No Hospitalized since last visit: No Patient Has Alerts: No Implantable device outside of the clinic excluding No cellular tissue based products placed in the center since last visit: Pain Present Now: No Electronic Signature(s) Signed: 05/21/2022 3:05:47 PM By: [name] EMT Entered By: [name] on 05/21/2022 15:05:47 -------------------------------------------------------------------------------- Encounter Discharge Information Details Patient Name: Date of Service: [name], [name] 05/21/2022 1:00 PM Medical Record Number: [id number] Patient Account Number: [id number] Date of Birth/Sex: Treating RN: [birth year] (76 y.o. [name] Primary Care Shreyansh Tiffany: [name] Other Clinician: [name] Referring Sabastien Tyler: Treating Dereke Neumann/Extender: [name] in Treatment:  9 Encounter Discharge Information Items Discharge Condition: Stable Ambulatory Status: Ambulatory Discharge Destination: Home Transportation: Private Auto Accompanied By: None Schedule Follow-up Appointment: Yes Clinical Summary of Care: Electronic Signature(s) Signed: 05/21/2022 3:09:34 PM By: [name] EMT Entered By: [name] on 05/21/2022 15:09:34 [name] ([id number]) [id number] Page 2 of 2 -------------------------------------------------------------------------------- Vitals Details Patient Name: Date of Service: Nathan [name] [name] 05/21/2022 1:00 PM Medical Record Number: [id number] Patient Account Number: [id number] Date of Birth/Sex: Treating RN: [birth year] (76 y.o. [name] Primary Care Khalea Ventura: [name] Other Clinician: [name] Referring Rosann Gorum: Treating Vetta Couzens/Extender: [name] in Treatment: 9 Vital Signs Time Taken: 12:47 Temperature (F): 97.4 Weight (lbs): 183 Pulse (bpm): 85 Respiratory Rate (breaths/min): 16 Blood Pressure (mmHg): 180/88 Reference Range: 80 - 120 mg / dl Electronic Signature(s) Signed: 05/21/2022 3:06:17 PM By: [name] EMT Entered By: [name] on 05/21/2022 15:06:17

## 2022-05-22 ENCOUNTER — Encounter (HOSPITAL_BASED_OUTPATIENT_CLINIC_OR_DEPARTMENT_OTHER): Payer: Medicare Other | Admitting: Internal Medicine

## 2022-05-22 DIAGNOSIS — Z923 Personal history of irradiation: Secondary | ICD-10-CM | POA: Diagnosis not present

## 2022-05-22 DIAGNOSIS — N3041 Irradiation cystitis with hematuria: Secondary | ICD-10-CM | POA: Diagnosis not present

## 2022-05-22 DIAGNOSIS — C61 Malignant neoplasm of prostate: Secondary | ICD-10-CM | POA: Diagnosis not present

## 2022-05-22 NOTE — Progress Notes (Signed)
KRIKOR, WILLET ([id number]) [id number] Page 1 of 2 Visit Report for 05/22/2022 HBO Details Patient Name: Date of Service: Nathan [name] [name] 05/22/2022 1:00 PM Medical Record Number: [id number] Patient Account Number: [id number] Date of Birth/Sex: Treating RN: 03-18-1946 (76 y.o. 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 HBO Treatment Details Treatment Number: 47 Patient Type: Outpatient Chamber Type: Monoplace Chamber Serial #: [id number] Treatment Protocol: 2.0 ATA with 90 minutes oxygen, and no air breaks Treatment Details Compression Rate Down: 2.0 psi / minute De-Compression Rate Up: 2.0 psi / minute Air breaks and breathing Decompress Decompress Compress Tx Pressure Begins Reached periods Begins Ends (leave unused spaces blank) Chamber Pressure (ATA 1 2 ------2 1 ) Clock Time (24 hr) 13:00 13:09 - - - - - - 14:40 14:51 Treatment Length: 111 (minutes) Treatment Segments: 4 Vital Signs Capillary Blood Glucose Reference Range: 80 - 120 mg / dl HBO Diabetic Blood Glucose Intervention Range: <131 mg/dl or >249 mg/dl Time Vitals Blood Respiratory Capillary Blood Glucose Pulse Action Type: Pulse: Temperature: Taken: Pressure: Rate: Glucose (mg/dl): Meter #: Oximetry (%) Taken: Pre 12:55 180/96 79 18 98 Post 14:55 164/87 71 18 97.5 Treatment Response Treatment Toleration: Well Treatment Completion Status: Treatment Completed without Adverse Event Electronic Signature(s) Signed: 05/22/2022 5:18:02 PM By: [name] CHT EMT BS , , Signed: 05/23/2022 8:35:44 AM By: Kalman Shan DO Entered By: [name] on 05/22/2022 17:18:01 -------------------------------------------------------------------------------- HBO Safety Checklist Details Patient Name: Date of Service: Nathan [name], [name] 05/22/2022 1:00 PM [name] ([id number]) [id number] Page 2 of 2 Medical Record Number: [id number] Patient Account Number: [id number] Date of Birth/Sex: Treating RN: 24-Sep-1945 (76 y.o. [name], [name] Primary Care Lorren Splawn: [name] Other Clinician: [name] Referring Sanjit Mcmichael: Treating Jessee Mezera/Extender: [name] in Treatment: 9 HBO Safety Checklist Items Safety Checklist Consent Form Signed Patient voided / foley secured and emptied When did you last eato 1000 Last dose of injectable or oral agent Ostomy pouch emptied and vented if applicable NA All implantable devices assessed, documented and approved NA Intravenous access site secured and place NA Valuables secured Linens and cotton and cotton/polyester blend (less than 51% polyester) Personal oil-based products / skin lotions / body lotions removed Wigs or hairpieces removed NA Smoking or tobacco materials removed NA Books / newspapers / magazines / loose paper removed Cologne, aftershave, perfume and deodorant removed Jewelry removed (may wrap wedding band) Make-up removed NA Hair care products removed Battery operated devices (external) removed Heating patches and chemical warmers removed Titanium eyewear removed Nail polish cured greater than 10 hours NA Casting material cured greater than 10 hours NA Hearing aids removed NA Loose dentures or partials removed dentures removed Prosthetics have been removed NA Patient demonstrates correct use of air break device (if applicable) Patient concerns have been addressed Patient grounding bracelet on and cord attached to chamber Specifics for Inpatients (complete in  addition to above) Medication sheet sent with patient NA Intravenous medications needed or due during therapy sent with patient NA Drainage tubes (e.g. nasogastric tube or chest tube secured and vented) NA Endotracheal or Tracheotomy tube secured NA Cuff deflated of air and inflated with saline NA Airway suctioned NA Notes Paper version used prior  to treatment. Electronic Signature(s) Signed: 05/22/2022 5:17:08 PM By: [name] CHT EMT BS , , Entered By: [name] on 05/22/2022 17:17:08

## 2022-05-22 NOTE — Progress Notes (Signed)
ZEVEN, KOCAK ([id number]) [id number] Page 1 of 2 Visit Report for 05/22/2022 Arrival Information Details Patient Name: Date of Service: Nathan [name] [name] 05/22/2022 1:00 PM Medical Record Number: [id number] Patient Account Number: [id number] Date of Birth/Sex: Treating RN: [birth year] (76 y.o. [name], [name] Primary Care Ileigh Mettler: [name] Other Clinician: [name] Referring Lexii Walsh: Treating Anthem Frazer/Extender: [name] in Treatment: 9 Visit Information History Since Last Visit All ordered tests and consults were completed: Yes Patient Arrived: Ambulatory Added or deleted any medications: No Arrival Time: 12:41 Any new allergies or adverse reactions: No Accompanied By: self Had a fall or experienced change in No Transfer Assistance: None activities of daily living that may affect Patient Identification Verified: Yes risk of falls: Secondary Verification Process Completed: Yes Signs or symptoms of abuse/neglect since last visito No Patient Requires Transmission-Based Precautions: No Hospitalized since last visit: No Patient Has Alerts: No Implantable device outside of the clinic excluding No cellular tissue based products placed in the center since last visit: Pain Present Now: No Electronic Signature(s) Signed: 05/22/2022 5:15:16 PM By: [name] CHT EMT BS , , Entered By: [name] on 05/22/2022 17:15:16 -------------------------------------------------------------------------------- Encounter Discharge Information Details Patient Name: Date of Service: Nathan [name], [name] 05/22/2022 1:00 PM Medical Record Number: [id number] Patient Account Number: [id number] Date of Birth/Sex: Treating RN: 20-Sep-1945 (76 y.o. [name], [name] Primary Care Darcelle Herrada: [name] Other Clinician: [name] Referring Rey Dansby: Treating Demarko Zeimet/Extender: [name] in Treatment: 9 Encounter Discharge Information Items Discharge Condition: Stable Ambulatory Status: Ambulatory Discharge Destination: Home Transportation: Private Auto Accompanied By: self Schedule Follow-up Appointment: No Clinical Summary of Care: Electronic Signature(s) Signed: 05/22/2022 5:18:40 PM By: [name] CHT EMT BS , , Entered By: [name] on 05/22/2022 17:18:39 Nathan [name] ([id number]) [id number] Page 2 of 2 -------------------------------------------------------------------------------- Vitals Details Patient Name: Date of Service: Nathan [name] [name] 05/22/2022 1:00 PM Medical Record Number: [id number] Patient Account Number: [id number] Date of Birth/Sex: Treating RN: September 02, 1945 (76 y.o. [name], [name] Primary Care Taci Sterling: [name] Other Clinician: [name] Referring Raihana Balderrama: Treating Gracelyn Coventry/Extender: [name] in Treatment: 9 Vital Signs Time Taken: 12:55 Temperature (F): 98.0 Weight (lbs): 183 Pulse (bpm): 79 Respiratory Rate (breaths/min): 18 Blood Pressure (mmHg): 180/96 Reference Range: 80 - 120 mg / dl Electronic Signature(s) Signed: 05/22/2022 5:15:45 PM By: [name] CHT EMT BS , , Entered By: [name] on 05/22/2022 17:15:45

## 2022-05-22 NOTE — Progress Notes (Signed)
JAILEN, COWARD ([id number]) [id number] Page 1 of 2 Visit Report for 05/21/2022 Problem List Details Patient Name: Date of Service: Nathan [name] [name] 05/21/2022 1:00 PM Medical Record Number: [id number] Patient Account Number: [id number] Date of Birth/Sex: Treating RN: [birth year] (76 y.o. 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/2022 No Yes Z92.3 Personal history of irradiation 03/14/2022 No Yes C61 Malignant neoplasm of prostate 03/14/2022 No Yes I10 Essential (primary) hypertension 03/14/2022 No Yes I48.0 Paroxysmal atrial fibrillation 03/14/2022 No Yes Inactive Problems Resolved Problems Electronic Signature(s) Signed: 05/21/2022 3:09:00 PM By: [name] EMT Signed: 05/22/2022 12:46:27 PM By: Kalman Shan DO Entered By: [name] on 05/21/2022 15:09:00 -------------------------------------------------------------------------------- SuperBill Details Patient Name: Date of Service: Nathan [name], Nathan [name] 05/21/2022 Medical Record Number: [id number] Patient Account Number: [id number] Date of Birth/Sex: Treating RN: 1946/06/06 (76 y.o. 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 I10 Essential (primary) hypertension I48.0 Paroxysmal atrial fibrillation Facility Procedures : CPT4 Code  Description: [id number] G0277-(Facility Use Only) HBOT full body chamber, 81mn , ICD-10 Diagnosis Description 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

## 2022-05-23 ENCOUNTER — Encounter (HOSPITAL_BASED_OUTPATIENT_CLINIC_OR_DEPARTMENT_OTHER): Payer: Medicare Other | Admitting: General Surgery

## 2022-05-23 DIAGNOSIS — N3041 Irradiation cystitis with hematuria: Secondary | ICD-10-CM | POA: Diagnosis not present

## 2022-05-23 NOTE — Progress Notes (Signed)
RUSS, LOOPER ([id number]) [id number] Page 1 of 1 Visit Report for 05/22/2022 SuperBill Details Patient Name: Date of Service: Nathan [name] [name] 05/22/2022 Medical Record Number: [id number] Patient Account Number: [id number] Date of Birth/Sex: Treating RN: 1945-11-26 (76 y.o. 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

## 2022-05-24 NOTE — Progress Notes (Signed)
RIDHAAN, DREIBELBIS ([id number]) [id number] Page 1 of 1 Visit Report for 05/23/2022 SuperBill Details Patient Name: Date of Service: Nathan [name] [name] 05/23/2022 Medical Record Number: [id number] Patient Account Number: [id number] Date of Birth/Sex: Treating RN: [birth year] (76 y.o. 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

## 2022-05-24 NOTE — Progress Notes (Signed)
TERAN, KNITTLE ([id number]) [id number] Page 1 of 2 Visit Report for 05/23/2022 Arrival Information Details Patient Name: Date of Service: Nathan [name] [name] 05/23/2022 1:00 PM Medical Record Number: [id number] Patient Account Number: [id number] Date of Birth/Sex: Treating RN: [birth year] (76 y.o. [name] Primary Care Germaine Ripp: [name] Other Clinician: [name] Referring Freyja Govea: Treating Rohit Deloria/Extender: [name] in Treatment: 10 Visit Information History Since Last Visit All ordered tests and consults were completed: Yes Patient Arrived: Ambulatory Added or deleted any medications: No Arrival Time: 12:01 Any new allergies or adverse reactions: No Accompanied By: self Had a fall or experienced change in No Transfer Assistance: None activities of daily living that may affect Patient Identification Verified: Yes risk of falls: Secondary Verification Process Completed: Yes Signs or symptoms of abuse/neglect since last visito No Patient Requires Transmission-Based Precautions: No Hospitalized since last visit: No Patient Has Alerts: No Implantable device outside of the clinic excluding No cellular tissue based products placed in the center since last visit: Pain Present Now: No Electronic Signature(s) Signed: 05/23/2022 4:44:05 PM By: [name] CHT EMT BS , , Entered By: [name] on 05/23/2022 16:44:05 -------------------------------------------------------------------------------- Encounter Discharge Information Details Patient Name: Date of Service: Nathan [name], [name] 05/23/2022 1:00 PM Medical Record Number: [id number] Patient Account Number: [id number] Date of Birth/Sex: Treating RN: [birth year] (76 y.o. [name] Primary Care Yazen Rosko: [name] Other Clinician: [name] Referring Shayann Garbutt: Treating Shell Yandow/Extender: [name] in Treatment: 10 Encounter Discharge Information Items Discharge Condition: Stable Ambulatory Status: Ambulatory Discharge Destination: Home Transportation: Private Auto Accompanied By: self Schedule Follow-up Appointment: No Clinical Summary of Care: Electronic Signature(s) Signed: 05/23/2022 4:46:40 PM By: [name] CHT EMT BS , , Entered By: [name] on 05/23/2022 16:46:40 [name] ([id number]) [id number] Page 2 of 2 -------------------------------------------------------------------------------- Vitals Details Patient Name: Date of Service: Nathan [name] [name] 05/23/2022 1:00 PM Medical Record Number: [id number] Patient Account Number: [id number] Date of Birth/Sex: Treating RN: 1945/07/17 (76 y.o. [name] Primary Care Selmer Adduci: [name] Other Clinician: [name] Referring Diandra Cimini: Treating Idonna Heeren/Extender: [name] in Treatment: 10 Vital Signs Time Taken: 12:44 Temperature (F): 98.1 Weight (lbs): 183 Pulse (bpm): 72 Respiratory Rate (breaths/min): 18 Blood Pressure (mmHg): 167/89 Reference Range: 80 - 120 mg / dl Electronic Signature(s) Signed: 05/23/2022 4:44:23 PM By: [name] CHT EMT BS , , Entered By: [name] on 05/23/2022 16:44:23

## 2022-05-24 NOTE — Progress Notes (Signed)
KEYMARI, SATO ([id number]) [id number] Page 1 of 2 Visit Report for 05/23/2022 HBO Details Patient Name: Date of Service: Nathan [name] [name] 05/23/2022 1:00 PM Medical Record Number: [id number] Patient Account Number: [id number] Date of Birth/Sex: Treating RN: 07-07-1945 (76 y.o. 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 HBO Treatment Details Treatment Number: 48 Patient Type: Outpatient Chamber Type: Monoplace Chamber Serial #: [id number] Treatment Protocol: 2.0 ATA with 90 minutes oxygen, and no air breaks Treatment Details Compression Rate Down: 1.5 psi / minute De-Compression Rate Up: 2.0 psi / minute Air breaks and breathing Decompress Decompress Compress Tx Pressure Begins Reached periods Begins Ends (leave unused spaces blank) Chamber Pressure (ATA 1 2 ------2 1 ) Clock Time (24 hr) 12:50 13:02 - - - - - - 14:32 14:40 Treatment Length: 110 (minutes) Treatment Segments: 4 Vital Signs Capillary Blood Glucose Reference Range: 80 - 120 mg / dl HBO Diabetic Blood Glucose Intervention Range: <131 mg/dl or >249 mg/dl Type: Time Vitals Blood Respiratory Capillary Blood Glucose Pulse Action Pulse: Temperature: Taken: Pressure: Rate: Glucose (mg/dl): Meter #: Oximetry (%) Taken: Pre 12:44 167/89 72 18 98.1 none per protocol Post 14:43 151/84 68 18 97.5 none per protocol Treatment Response Treatment Toleration: Well Treatment Completion Status: Treatment Completed without Adverse Event Physician HBO Attestation: I certify that I supervised this HBO treatment in accordance with  Medicare guidelines. A trained emergency response team is readily available per Yes hospital policies and procedures. Continue HBOT as ordered. Yes Electronic Signature(s) Signed: 05/23/2022 5:08:04 PM By: [name] MD FACS Previous Signature: 05/23/2022 4:46:06 PM Version By: [name] CHT EMT BS , , Entered By: [name] on 05/23/2022 17:08:03 [name] ([id number]) [id number] Page 2 of 2 -------------------------------------------------------------------------------- HBO Safety Checklist Details Patient Name: Date of Service: Nathan [name] [name] 05/23/2022 1:00 PM Medical Record Number: [id number] Patient Account Number: [id number] Date of Birth/Sex: Treating RN: 01/11/1946 (76 y.o. [name] Primary Care Candis Kabel: [name] Other Clinician: [name] Referring Ellyce Lafevers: Treating Rechelle Niebla/Extender: [name] in Treatment: 10 HBO Safety Checklist Items Safety Checklist Consent Form Signed Patient voided / foley secured and emptied When did you last eato 0930 Last dose of injectable or oral agent n/a Ostomy pouch emptied and vented if applicable NA All implantable devices assessed, documented and approved NA Intravenous access site secured and place NA Valuables secured Linens and cotton and cotton/polyester blend (less than 51% polyester) Personal oil-based products / skin lotions / body lotions removed Wigs or hairpieces removed NA Smoking or tobacco materials removed NA Books / newspapers / magazines / loose paper removed Cologne, aftershave, perfume and deodorant removed Jewelry removed (may wrap wedding band) Make-up removed NA Hair care products removed Battery operated devices (external) removed Heating patches and chemical warmers removed Titanium eyewear removed Nail polish cured greater than 10 hours NA Casting material cured greater than 10  hours NA Hearing aids removed NA Loose dentures or partials removed dentures removed Prosthetics have been removed NA Patient demonstrates correct use of air break device (if applicable) Patient concerns have been addressed Patient grounding bracelet on and cord attached to chamber Specifics for Inpatients (complete in addition to above) Medication sheet sent with patient NA Intravenous  medications needed or due during therapy sent with patient NA Drainage tubes (e.g. nasogastric tube or chest tube secured and vented) NA Endotracheal or Tracheotomy tube secured NA Cuff deflated of air and inflated with saline NA Airway suctioned NA Notes Paper version used prior to treatment. Electronic Signature(s) Signed: 05/23/2022 4:45:20 PM By: [name] CHT EMT BS , , Entered By: [name] on 05/23/2022 16:45:20

## 2022-05-28 ENCOUNTER — Encounter (HOSPITAL_BASED_OUTPATIENT_CLINIC_OR_DEPARTMENT_OTHER): Payer: Medicare Other | Admitting: Internal Medicine

## 2022-05-28 DIAGNOSIS — C61 Malignant neoplasm of prostate: Secondary | ICD-10-CM

## 2022-05-28 DIAGNOSIS — Z923 Personal history of irradiation: Secondary | ICD-10-CM

## 2022-05-28 DIAGNOSIS — N3041 Irradiation cystitis with hematuria: Secondary | ICD-10-CM

## 2022-05-28 NOTE — Progress Notes (Addendum)
LINKOLN, ALKIRE ([id number]) [id number] Page 1 of 2 Visit Report for 05/28/2022 HBO Details Patient Name: Date of Service: Nathan [name] [name] 05/28/2022 1:00 PM Medical Record Number: [id number] Patient Account Number: [id number] Date of Birth/Sex: Treating RN: [birth year] (76 y.o. 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 HBO Treatment Details Treatment Number: 49 Patient Type: Outpatient Chamber Type: Monoplace Chamber Serial #: [id number] Treatment Protocol: 2.0 ATA with 90 minutes oxygen, and no air breaks Treatment Details Compression Rate Down: 2.0 psi / minute De-Compression Rate Up: 2.0 psi / minute Air breaks and breathing Decompress Decompress Compress Tx Pressure Begins Reached periods Begins Ends (leave unused spaces blank) Chamber Pressure (ATA 1 2 ------2 1 ) Clock Time (24 hr) 12:24 12:32 - - - - - - 14:02 14:09 Treatment Length: 105 (minutes) Treatment Segments: 3 Vital Signs Capillary Blood Glucose Reference Range: 80 - 120 mg / dl HBO Diabetic Blood Glucose Intervention Range: <131 mg/dl or >249 mg/dl Time Vitals Blood Respiratory Capillary Blood Glucose Pulse Action Type: Pulse: Temperature: Taken: Pressure: Rate: Glucose (mg/dl): Meter #: Oximetry (%) Taken: Pre 12:19 149/87 88 16 97.7 Post 14:13 153/76 68 16 97.9 Treatment Response Treatment Toleration: Well Treatment Completion Status: Treatment Completed without Adverse Event Physician HBO Attestation: I certify that I supervised this HBO treatment in accordance with Medicare guidelines. A trained emergency  response team is readily available per Yes hospital policies and procedures. Continue HBOT as ordered. Yes Electronic Signature(s) Unsigned Previous Signature: 05/28/2022 3:54:48 PM Version By: [name] EMT Previous Signature: 05/28/2022 4:00:28 PM Version By: Kalman Shan DO Entered By: Kalman Shan on 05/28/2022 16:03:01 Signature(s): JANTZEN, PILGER ([id number]) [id number] Date(s): [id number] Page 2 of 2 -------------------------------------------------------------------------------- HBO Safety Checklist Details Patient Name: Date of Service: Nathan [name] [name] 05/28/2022 1:00 PM Medical Record Number: [id number] Patient Account Number: [id number] Date of Birth/Sex: Treating RN: 12/04/1945 (76 y.o. [name], [name] Primary Care Vaughn Beaumier: [name] Other Clinician: [name] Referring Kholton Coate: Treating Manreet Kiernan/Extender: [name] in Treatment: 10 HBO Safety Checklist Items Safety Checklist Consent Form Signed Patient voided / foley secured and emptied When did you last eato 0930 Last dose of injectable or oral agent NA Ostomy pouch emptied and vented if applicable NA All implantable devices assessed, documented and approved foley cath to leg bag Intravenous access site secured and place NA Valuables secured Linens and cotton and cotton/polyester blend (less than 51% polyester) Personal oil-based products / skin lotions / body lotions removed Wigs or hairpieces removed NA Smoking or tobacco materials removed Books / newspapers / magazines / loose paper removed Cologne, aftershave, perfume and deodorant removed Jewelry removed (may wrap wedding band) Make-up removed NA Hair care products removed Battery operated devices (external) removed Heating patches and chemical warmers removed Titanium eyewear removed NA Nail polish cured greater than 10 hours NA Casting material cured greater than 10  hours NA Hearing aids removed NA Loose dentures or partials removed removed by patient Prosthetics have been removed NA Patient demonstrates correct use of air break device (if applicable) Patient concerns have been addressed Patient grounding bracelet on and cord attached to chamber Specifics for Inpatients (complete in addition to above) Medication sheet sent with patient NA Intravenous  medications needed or due during therapy sent with patient NA Drainage tubes (e.g. nasogastric tube or chest tube secured and vented) NA Endotracheal or Tracheotomy tube secured NA Cuff deflated of air and inflated with saline NA Airway suctioned NA Notes The safety check was done before the treatment was started. Electronic Signature(s) Signed: 05/28/2022 3:53:41 PM By: [name] EMT Entered By: [name] on 05/28/2022 15:53:40

## 2022-05-28 NOTE — Progress Notes (Signed)
RAMAN, FEATHERSTON ([id number]) [id number] Page 1 of 2 Visit Report for 05/28/2022 Arrival Information Details Patient Name: Date of Service: Nathan [name] Nathan [name] 05/28/2022 1:00 PM Medical Record Number: [id number] Patient Account Number: [id number] Date of Birth/Sex: Treating RN: May 19, 1946 (76 y.o. Nathan [name], [name] Primary Care Nathan [name]: [name] Other Clinician: [name] Referring Gentry Seeber: Treating Maxine Fredman/Extender: [name] in Treatment: 10 Visit Information History Since Last Visit All ordered tests and consults were completed: Yes Patient Arrived: Nathan [name] Added or deleted any medications: No Arrival Time: 12:02 Any new allergies or adverse reactions: No Accompanied By: None Had a fall or experienced change in No Transfer Assistance: None activities of daily living that may affect Patient Identification Verified: Yes risk of falls: Secondary Verification Process Completed: Yes Signs or symptoms of abuse/neglect since last visito No Patient Requires Transmission-Based Precautions: No Hospitalized since last visit: No Patient Has Alerts: No Implantable device outside of the clinic excluding No cellular tissue based products placed in the center since last visit: Pain Present Now: No Electronic Signature(s) Signed: 05/29/2022 2:31:30 PM By: [name] CHT EMT BS , , Previous Signature: 05/28/2022 3:51:11 PM Version By: [name] EMT Entered By: [name] on 05/29/2022 14:31:30 -------------------------------------------------------------------------------- Encounter Discharge Information Details Patient Name: Date of Service: Nathan [name], Nathan [name] Nathan [name] 05/28/2022 1:00 PM Medical Record Number: [id number] Patient Account Number: [id number] Date of Birth/Sex: Treating RN: February 04, 1946 (76 y.o. Nathan [name] Primary Care Nathan [name]: [name] Other Clinician: [name] Referring  Nathan [name]: Treating Jaiona Simien/Extender: [name] in Treatment: 10 Encounter Discharge Information Items Discharge Condition: Stable Ambulatory Status: Ambulatory Discharge Destination: Home Transportation: Private Auto Accompanied By: None Schedule Follow-up Appointment: Yes Clinical Summary of Care: Electronic Signature(s) Signed: 05/28/2022 3:55:41 PM By: [name] EMT Entered By: [name] on 05/28/2022 15:55:41 Nathan [name] ([id number]) [id number] Page 2 of 2 -------------------------------------------------------------------------------- Vitals Details Patient Name: Date of Service: Nathan [name] Nathan [name] 05/28/2022 1:00 PM Medical Record Number: [id number] Patient Account Number: [id number] Date of Birth/Sex: Treating RN: [birth year] (76 y.o. Nathan [name], Nathan [name] Primary Care Nathan [name]: [name] Other Clinician: [name] Referring Nathan [name]: Treating Nathan [name]/Extender: [name] in Treatment: 10 Vital Signs Time Taken: 12:19 Temperature (F): 97.7 Weight (lbs): 183 Pulse (bpm): 88 Respiratory Rate (breaths/min): 16 Blood Pressure (mmHg): 149/87 Reference Range: 80 - 120 mg / dl Electronic Signature(s) Signed: 05/28/2022 3:52:14 PM By: [name] EMT Entered By: [name] on 05/28/2022 15:52:14

## 2022-05-28 NOTE — Progress Notes (Signed)
NAEL, PETROSYAN ([id number]) [id number] Page 1 of 2 Visit Report for 05/28/2022 Problem List Details Patient Name: Date of Service: Nathan [name] [name] 05/28/2022 1:00 PM Medical Record Number: [id number] Patient Account Number: [id number] Date of Birth/Sex: Treating RN: 26-Mar-1946 (76 y.o. [name], [name] Primary Care Provider: [name] Other Clinician: [name] Referring Provider: Treating Provider/Extender: [name] in Treatment: 10 Active Problems ICD-10 Encounter Code Description Active Date MDM Diagnosis N30.41 Irradiation cystitis with hematuria 03/14/2022 No Yes Z92.3 Personal history of irradiation 03/14/2022 No Yes C61 Malignant neoplasm of prostate 03/14/2022 No Yes I10 Essential (primary) hypertension 03/14/2022 No Yes I48.0 Paroxysmal atrial fibrillation 03/14/2022 No Yes Inactive Problems Resolved Problems Electronic Signature(s) Signed: 05/28/2022 3:55:15 PM By: [name] EMT Signed: 05/28/2022 4:00:28 PM By: Kalman Shan DO Entered By: [name] on 05/28/2022 15:55:15 -------------------------------------------------------------------------------- SuperBill Details Patient Name: Date of Service: Nathan [name], Nathan [name] 05/28/2022 Medical Record Number: [id number] Patient Account Number: [id number] Date of Birth/Sex: Treating RN: March 27, 1946 (76 y.o. Nathan [name] Primary Care Provider: [name] Other Clinician: [name] Referring Provider: Treating Provider/Extender: [name] in Treatment: [address] 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

## 2022-05-29 ENCOUNTER — Encounter (HOSPITAL_BASED_OUTPATIENT_CLINIC_OR_DEPARTMENT_OTHER): Payer: Medicare Other | Admitting: Internal Medicine

## 2022-05-29 DIAGNOSIS — C61 Malignant neoplasm of prostate: Secondary | ICD-10-CM | POA: Diagnosis not present

## 2022-05-29 DIAGNOSIS — Z923 Personal history of irradiation: Secondary | ICD-10-CM | POA: Diagnosis not present

## 2022-05-29 DIAGNOSIS — N3041 Irradiation cystitis with hematuria: Secondary | ICD-10-CM | POA: Diagnosis not present

## 2022-05-29 NOTE — Progress Notes (Addendum)
ALFORD, GAMERO ([id number]) [id number] Page 1 of 2 Visit Report for 05/29/2022 Arrival Information Details Patient Name: Date of Service: Nathan [name] [name] 05/29/2022 1:00 PM Medical Record Number: [id number] Patient Account Number: [id number] Date of Birth/Sex: Treating RN: Oct 31, 1945 (76 y.o. Mare Ferrari Primary Care Doy Taaffe: [name] Other Clinician: [name] Referring Yanky [name]: Treating Itsel Opfer/Extender: [name] in Treatment: 10 Visit Information History Since Last Visit All ordered tests and consults were completed: Yes Patient Arrived: Nathan [name] Added or deleted any medications: No Arrival Time: 12:39 Any new allergies or adverse reactions: No Accompanied By: self Had a fall or experienced change in No Transfer Assistance: None activities of daily living that may affect Patient Identification Verified: Yes risk of falls: Secondary Verification Process Completed: Yes Signs or symptoms of abuse/neglect since last visito No Patient Requires Transmission-Based Precautions: No Hospitalized since last visit: No Patient Has Alerts: No Implantable device outside of the clinic excluding No cellular tissue based products placed in the center since last visit: Pain Present Now: No Electronic Signature(s) Signed: 05/29/2022 2:25:20 PM By: [name] CHT EMT BS , , Entered By: [name] on 05/29/2022 14:25:20 -------------------------------------------------------------------------------- Encounter Discharge Information Details Patient Name: Date of Service: Nathan [name], [name] 05/29/2022 1:00 PM Medical Record Number: [id number] Patient Account Number: [id number] Date of Birth/Sex: Treating RN: [birth year] (76 y.o. Mare Ferrari Primary Care Stanlee Roehrig: [name] Other Clinician: [name] Referring Satchel Heidinger: Treating Shamicka Inga/Extender: [name] in Treatment: 10 Encounter Discharge Information Items Discharge Condition: Stable Ambulatory Status: Ambulatory Discharge Destination: Home Transportation: Private Auto Accompanied By: self Schedule Follow-up Appointment: No Clinical Summary of Care: Electronic Signature(s) Signed: 05/29/2022 4:10:51 PM By: [name] CHT EMT BS , , Entered By: [name] on 05/29/2022 16:10:51 [name] ([id number]) [id number] Page 2 of 2 -------------------------------------------------------------------------------- Vitals Details Patient Name: Date of Service: Nathan [name] [name] 05/29/2022 1:00 PM Medical Record Number: [id number] Patient Account Number: [id number] Date of Birth/Sex: Treating RN: 12-21-1945 (76 y.o. Mare Ferrari Primary Care Raevyn Sokol: [name] Other Clinician: [name] Referring Nathan [name]: Treating Calvin Chura/Extender: [name] in Treatment: 10 Vital Signs Time Taken: 12:51 Temperature (F): 98.1 Weight (lbs): 183 Pulse (bpm): 93 Respiratory Rate (breaths/min): 18 Blood Pressure (mmHg): 161/92 Reference Range: 80 - 120 mg / dl Electronic Signature(s) Signed: 05/29/2022 2:25:44 PM By: [name] CHT EMT BS , , Entered By: [name] on 05/29/2022 14:25:44

## 2022-05-29 NOTE — Progress Notes (Addendum)
SIRAJ, DERMODY ([id number]) [id number] Page 1 of 2 Visit Report for 05/29/2022 HBO Details Patient Name: Date of Service: Nathan [name] [name] 05/29/2022 1:00 PM Medical Record Number: [id number] Patient Account Number: [id number] Date of Birth/Sex: Treating RN: 25-Jul-1945 (76 y.o. 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 HBO Treatment Details Treatment Number: 50 Patient Type: Outpatient Chamber Type: Monoplace Chamber Serial #: [id number] Treatment Protocol: 2.0 ATA with 90 minutes oxygen, and no air breaks Treatment Details Compression Rate Down: 2.0 psi / minute De-Compression Rate Up: 2.0 psi / minute Air breaks and breathing Decompress Decompress Compress Tx Pressure Begins Reached periods Begins Ends (leave unused spaces blank) Chamber Pressure (ATA 1 2 ------2 1 ) Clock Time (24 hr) 12:57 13:07 - - - - - - 14:37 14:46 Treatment Length: 109 (minutes) Treatment Segments: 4 Vital Signs Capillary Blood Glucose Reference Range: 80 - 120 mg / dl HBO Diabetic Blood Glucose Intervention Range: <131 mg/dl or >249 mg/dl Type: Time Vitals Blood Respiratory Capillary Blood Glucose Pulse Action Pulse: Temperature: Taken: Pressure: Rate: Glucose (mg/dl): Meter #: Oximetry (%) Taken: Pre 12:51 161/92 93 18 98.1 none per protocol Post 14:49 165/89 70 18 98.1 none per protocol Treatment Response Treatment Toleration: Well Treatment Completion Status: Treatment Completed without Adverse Event Treatment Notes Patient safely placed in chamber after safety check was performed. Patient  tolerated travel at 1 psi/min until reaching 6 psig at which point travel rate was increased to 2 psi/min. Patient denied problems with ear equalization. Patient tolerated treatment well. Chamber was decompressed at a rate of 2 psi/min, patient denied problems with ear equalization. Patient was stable upon discharge. Physician HBO Attestation: I certify that I supervised this HBO treatment in accordance with Medicare guidelines. A trained emergency response team is readily available per Yes hospital policies and procedures. Continue HBOT as ordered. Yes Electronic Signature(s) Signed: 05/31/2022 9:11:53 AM By: Kalman Shan DO Previous Signature: 05/29/2022 4:10:10 PM Version By: [name] CHT EMT BS , , Previous Signature: 05/29/2022 2:29:07 PM Version By: [name] CHT EMT BS , , Previous Signature: 05/29/2022 4:03:55 PM Version By: Kalman Shan DO Entered By: Kalman Shan on 05/31/2022 09:11:28 [name] ([id number]) [id number] Page 2 of 2 -------------------------------------------------------------------------------- HBO Safety Checklist Details Patient Name: Date of Service: Nathan [name] [name] 05/29/2022 1:00 PM Medical Record Number: [id number] Patient Account Number: [id number] Date of Birth/Sex: Treating RN: 1946-04-10 (76 y.o. Mare Ferrari Primary Care Eddye Broxterman: [name] Other Clinician: [name] Referring Derril Franek: Treating Elvena Oyer/Extender: [name] in Treatment: 10 HBO Safety Checklist Items Safety Checklist Consent Form Signed Patient voided / foley secured and emptied When did you last eato 0900 Last dose of injectable or oral agent n/a Ostomy pouch emptied and vented if applicable NA All implantable devices assessed, documented and approved NA Intravenous access site secured and place NA Valuables secured Linens and cotton and cotton/polyester blend (less  than 51% polyester) Personal oil-based products / skin lotions / body lotions removed Wigs or hairpieces removed NA Smoking or tobacco materials removed NA Books / newspapers / magazines / loose paper removed Cologne, aftershave, perfume and deodorant removed Jewelry removed (may wrap wedding band) Make-up removed NA Hair care products  removed Battery operated devices (external) removed Heating patches and chemical warmers removed Titanium eyewear removed Nail polish cured greater than 10 hours NA Casting material cured greater than 10 hours NA Hearing aids removed NA Loose dentures or partials removed dentures removed Prosthetics have been removed NA Patient demonstrates correct use of air break device (if applicable) Patient concerns have been addressed Patient grounding bracelet on and cord attached to chamber Specifics for Inpatients (complete in addition to above) Medication sheet sent with patient NA Intravenous medications needed or due during therapy sent with patient NA Drainage tubes (e.g. nasogastric tube or chest tube secured and vented) NA Endotracheal or Tracheotomy tube secured NA Cuff deflated of air and inflated with saline NA Airway suctioned NA Notes Paper version used prior to treatment. Electronic Signature(s) Signed: 05/29/2022 2:27:22 PM By: [name] CHT EMT BS , , Entered By: [name] on 05/29/2022 14:27:22

## 2022-05-30 ENCOUNTER — Encounter (HOSPITAL_BASED_OUTPATIENT_CLINIC_OR_DEPARTMENT_OTHER): Payer: Medicare Other | Admitting: General Surgery

## 2022-05-30 DIAGNOSIS — N3041 Irradiation cystitis with hematuria: Secondary | ICD-10-CM | POA: Diagnosis not present

## 2022-05-30 NOTE — Progress Notes (Signed)
SEARCY, MIYOSHI ([id number]) [id number] Page 1 of 1 Visit Report for 05/30/2022 SuperBill Details Patient Name: Date of Service: Nathan [name] [name] 05/30/2022 Medical Record Number: [id number] Patient Account Number: [id number] Date of Birth/Sex: Treating RN: 08/14/1945 (76 y.o. 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

## 2022-05-30 NOTE — Progress Notes (Addendum)
Nathan [name] ([id number]) [id number] Page 1 of 2 Visit Report for 05/30/2022 Arrival Information Details Patient Name: Date of Service: Nathan [name] [name] 05/30/2022 1:00 PM Medical Record Number: [id number] Patient Account Number: [id number] Date of Birth/Sex: Treating RN: March 10, 1946 (76 y.o. Nathan [name] Primary Care Nathan [name]: Nathan [name] Other Clinician: [name] Referring Nathan [name]: Treating Nathan [name]: Nathan [name] in Treatment: 11 Visit Information History Since Last Visit All ordered tests and consults were completed: Yes Patient Arrived: Nathan [name] Added or deleted any medications: No Arrival Time: 11:42 Any new allergies or adverse reactions: No Accompanied By: self Had a fall or experienced change in No Transfer Assistance: None activities of daily living that may affect Patient Identification Verified: Yes risk of falls: Secondary Verification Process Completed: Yes Signs or symptoms of abuse/neglect since last visito No Patient Requires Transmission-Based Precautions: No Hospitalized since last visit: No Patient Has Alerts: No Implantable device outside of the clinic excluding No cellular tissue based products placed in the center since last visit: Pain Present Now: No Electronic Signature(s) Signed: 05/30/2022 1:52:59 PM By: Nathan [name] , , Entered By: Nathan [name] on 05/30/2022 13:52:59 -------------------------------------------------------------------------------- Encounter Discharge Information Details Patient Name: Date of Service: Nathan [name], [name] 05/30/2022 1:00 PM Medical Record Number: [id number] Patient Account Number: [id number] Date of Birth/Sex: Treating RN: 1946-01-20 (76 y.o. Nathan [name] Primary Care Kaley Jutras: Nathan [name] Other Clinician: [name] Referring Nathan [name]: Treating Nathan [name]: Nathan [name] in Treatment: 11 Encounter Discharge Information Items Discharge Condition: Stable Ambulatory Status: Cane Discharge Destination: Home Transportation: Private Auto Accompanied By: self Schedule Follow-up Appointment: No Clinical Summary of Care: Electronic Signature(s) Signed: 05/30/2022 4:17:14 PM By: Nathan [name] , , Entered By: Nathan [name] on 05/30/2022 16:17:14 Nathan [name] ([id number]) [id number] Page 2 of 2 -------------------------------------------------------------------------------- Vitals Details Patient Name: Date of Service: Nathan [name] [name] 05/30/2022 1:00 PM Medical Record Number: [id number] Patient Account Number: [id number] Date of Birth/Sex: Treating RN: 10-03-1945 (76 y.o. Nathan [name] Primary Care Nathan [name]: Nathan [name] Other Clinician: [name] Referring Shahab Polhamus: Treating Roseanna Koplin/Extender: Nathan [name] in Treatment: 11 Vital Signs Time Taken: 12:03 Temperature (F): 97.3 Weight (lbs): 183 Pulse (bpm): 75 Respiratory Rate (breaths/min): 18 Blood Pressure (mmHg): 161/85 Reference Range: 80 - 120 mg / dl Electronic Signature(s) Signed: 05/30/2022 1:53:25 PM By: Nathan [name] , , Entered By: Nathan [name] on 05/30/2022 13:53:25

## 2022-05-30 NOTE — Progress Notes (Addendum)
JONNATAN, HANNERS ([id number]) [id number] Page 1 of 2 Visit Report for 05/30/2022 HBO Details Patient Name: Date of Service: [name] 05/30/2022 1:00 PM Medical Record Number: [id number] Patient Account Number: [id number] Date of Birth/Sex: Treating RN: 05-05-1946 (76 y.o. 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 HBO Treatment Details Treatment Number: 51 Patient Type: Outpatient Chamber Type: Monoplace Chamber Serial #: [id number] Treatment Protocol: 2.0 ATA with 90 minutes oxygen, and no air breaks Treatment Details Compression Rate Down: 2.0 psi / minute De-Compression Rate Up: 2.0 psi / minute Air breaks and breathing Decompress Decompress Compress Tx Pressure Begins Reached periods Begins Ends (leave unused spaces blank) Chamber Pressure (ATA 1 2 ------2 1 ) Clock Time (24 hr) 12:08 12:17 - - - - - - 13:47 13:55 Treatment Length: 107 (minutes) Treatment Segments: 4 Vital Signs Capillary Blood Glucose Reference Range: 80 - 120 mg / dl HBO Diabetic Blood Glucose Intervention Range: <131 mg/dl or >249 mg/dl Type: Time Vitals Blood Respiratory Capillary Blood Glucose Pulse Action Pulse: Temperature: Taken: Pressure: Rate: Glucose (mg/dl): Meter #: Oximetry (%) Taken: Pre 12:03 161/85 75 18 97.3 none per protocol Post 13:58 152/83 63 18 97.7 none per protocol Treatment Response Treatment Toleration: Well Treatment Completion Status: Treatment Completed without Adverse Event Treatment Notes Patient safely placed in the chamber after performing safety checklist.  Patient tolerated treatment at rate set 1 psi/min until reaching approximately 6 psig at which time patient confirmed ear equalization was progressing well. Rate set was increased to 2 psi/min. Patient tolerated travel and subsequent treatment well. During decompression rate set was 2 psi/min as well. Patient tolerated decompression of chamber well, denying any issues with ear equalization. Patient was stable upon discharge. Physician HBO Attestation: I certify that I supervised this HBO treatment in accordance with Medicare guidelines. A trained emergency response team is readily available per Yes hospital policies and procedures. Continue HBOT as ordered. Yes Electronic Signature(s) Signed: 05/31/2022 8:16:14 AM By: [name] MD FACS Previous Signature: 05/30/2022 7:15:38 PM Version By: [name] CHT EMT BS , , Previous Signature: 05/30/2022 4:33:00 PM Version By: [name] MD FACS Previous Signature: 05/30/2022 4:16:26 PM Version By: [name] CHT EMT BS , , Previous Signature: 05/30/2022 3:03:27 PM Version By: [name] CHT EMT BS , , [place], Juanda Crumble ([id number]) [id number] Page 2 of 2 Previous Signature: 05/30/2022 3:03:27 PM Version By: [name] CHT EMT BS , , Entered By: [name] on 05/31/2022 08:16:14 -------------------------------------------------------------------------------- HBO Safety Checklist Details Patient Name: Date of Service: [facility] DGER, [name] 05/30/2022 1:00 PM Medical Record Number: [id number] Patient Account Number: [id number] Date of Birth/Sex: Treating RN: [birth year] (76 y.o. [name] Primary Care Tion Tse: [name] Other Clinician: [name] Referring Tyron Manetta: Treating Nykolas Bacallao/Extender: [name] in Treatment: 11 HBO Safety Checklist Items Safety Checklist Consent Form Signed Patient voided / foley secured and emptied  foley When did you last eato 0930 Last dose of injectable or oral agent n/a Ostomy pouch emptied and vented if applicable NA All implantable devices assessed, documented and approved NA Intravenous access site secured and place NA Valuables secured Linens and cotton and cotton/polyester blend (less than 51% polyester) Personal oil-based products /  skin lotions / body lotions removed Wigs or hairpieces removed NA Smoking or tobacco materials removed NA Books / newspapers / magazines / loose paper removed Cologne, aftershave, perfume and deodorant removed Jewelry removed (may wrap wedding band) Make-up removed NA Hair care products removed Battery operated devices (external) removed Heating patches and chemical warmers removed Titanium eyewear removed Nail polish cured greater than 10 hours NA Casting material cured greater than 10 hours NA Hearing aids removed NA Loose dentures or partials removed dentures removed Prosthetics have been removed NA Patient demonstrates correct use of air break device (if applicable) Patient concerns have been addressed Patient grounding bracelet on and cord attached to chamber Specifics for Inpatients (complete in addition to above) Medication sheet sent with patient NA Intravenous medications needed or due during therapy sent with patient NA Drainage tubes (e.g. nasogastric tube or chest tube secured and vented) NA Endotracheal or Tracheotomy tube secured NA Cuff deflated of air and inflated with saline NA Airway suctioned NA Notes Paper version used prior to treatment. Electronic Signature(s) Signed: 05/30/2022 1:54:56 PM By: [name] CHT EMT BS , , Entered By: [name] on 05/30/2022 13:54:55

## 2022-05-31 ENCOUNTER — Encounter (HOSPITAL_BASED_OUTPATIENT_CLINIC_OR_DEPARTMENT_OTHER): Payer: Medicare Other | Admitting: General Surgery

## 2022-05-31 DIAGNOSIS — N3041 Irradiation cystitis with hematuria: Secondary | ICD-10-CM | POA: Diagnosis not present

## 2022-05-31 NOTE — Progress Notes (Signed)
SOTERO, BRINKMEYER ([id number]) [id number] Page 1 of 1 Visit Report for 05/31/2022 SuperBill Details Patient Name: Date of Service: Nathan [name] [name] 05/31/2022 Medical Record Number: [id number] Patient Account Number: [id number] Date of Birth/Sex: Treating RN: 04-Sep-1945 (76 y.o. 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

## 2022-05-31 NOTE — Progress Notes (Signed)
OIVA, DIBARI ([id number]) [id number] Page 1 of 2 Visit Report for 05/31/2022 Arrival Information Details Patient Name: Date of Service: Nathan [name] [name] 05/31/2022 1:00 PM Medical Record Number: [id number] Patient Account Number: [id number] Date of Birth/Sex: Treating RN: 05-Jun-1946 (76 y.o. [name] Primary Care Patryck Kilgore: [name] Other Clinician: [name] Referring Cardin Nitschke: Treating Alexes Menchaca/Extender: [name] in Treatment: 11 Visit Information History Since Last Visit All ordered tests and consults were completed: Yes Patient Arrived: [name] Added or deleted any medications: No Arrival Time: 11:57 Any new allergies or adverse reactions: No Accompanied By: self Had a fall or experienced change in No Transfer Assistance: None activities of daily living that may affect Patient Identification Verified: Yes risk of falls: Secondary Verification Process Completed: Yes Signs or symptoms of abuse/neglect since last visito No Patient Requires Transmission-Based Precautions: No Hospitalized since last visit: No Patient Has Alerts: No Implantable device outside of the clinic excluding No cellular tissue based products placed in the center since last visit: Pain Present Now: No Electronic Signature(s) Signed: 05/31/2022 2:34:23 PM By: [name] CHT EMT BS , , Previous Signature: 05/31/2022 2:29:49 PM Version By: [name] CHT EMT BS , , Entered By: [name] on 05/31/2022 14:34:23 -------------------------------------------------------------------------------- Encounter Discharge Information Details Patient Name: Date of Service: Nathan [name], [name] 05/31/2022 1:00 PM Medical Record Number: [id number] Patient Account Number: [id number] Date of Birth/Sex: Treating RN: Dec 09, 1945 (76 y.o. [name] Primary Care Nathaly Dawkins: [name] Other Clinician:  [name] Referring Cheikh Bramble: Treating Celest Reitz/Extender: [name] in Treatment: 11 Encounter Discharge Information Items Discharge Condition: Stable Ambulatory Status: Cane Discharge Destination: Home Transportation: Private Auto Accompanied By: self Schedule Follow-up Appointment: No Clinical Summary of Care: Electronic Signature(s) Signed: 05/31/2022 2:33:59 PM By: [name] CHT EMT BS , , Entered By: [name] on 05/31/2022 14:33:59 [name] ([id number]) [id number] Page 2 of 2 -------------------------------------------------------------------------------- Vitals Details Patient Name: Date of Service: Nathan [name] [name] 05/31/2022 1:00 PM Medical Record Number: [id number] Patient Account Number: [id number] Date of Birth/Sex: Treating RN: Aug 26, 1945 (76 y.o. [name] Primary Care Chelsa Stout: [name] Other Clinician: [name] Referring Jamicah Anstead: Treating Lashana Spang/Extender: [name] in Treatment: 11 Vital Signs Time Taken: 12:10 Temperature (F): 97.5 Weight (lbs): 183 Pulse (bpm): 68 Respiratory Rate (breaths/min): 18 Blood Pressure (mmHg): 146/87 Reference Range: 80 - 120 mg / dl Electronic Signature(s) Signed: 05/31/2022 2:30:15 PM By: [name] CHT EMT BS , , Entered By: [name] on 05/31/2022 14:30:14

## 2022-05-31 NOTE — Progress Notes (Signed)
DEZI, SCHANER ([id number]) [id number] Page 1 of 1 Visit Report for 05/29/2022 SuperBill Details Patient Name: Date of Service: Nathan [name] [name] 05/29/2022 Medical Record Number: [id number] Patient Account Number: [id number] Date of Birth/Sex: Treating RN: [birth year] (76 y.o. 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

## 2022-05-31 NOTE — Progress Notes (Signed)
JOEL, COWIN ([id number]) [id number] Page 1 of 2 Visit Report for 05/31/2022 HBO Details Patient Name: Date of Service: Nathan [name] [name] 05/31/2022 1:00 PM Medical Record Number: [id number] Patient Account Number: [id number] Date of Birth/Sex: Treating RN: 1945/11/22 (76 y.o. 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 HBO Treatment Details Treatment Number: 52 Patient Type: Outpatient Chamber Type: Monoplace Chamber Serial #: [id number] Treatment Protocol: 2.0 ATA with 90 minutes oxygen, and no air breaks Treatment Details Compression Rate Down: 2.0 psi / minute De-Compression Rate Up: 2.0 psi / minute Air breaks and breathing Decompress Decompress Compress Tx Pressure Begins Reached periods Begins Ends (leave unused spaces blank) Chamber Pressure (ATA 1 2 ------2 1 ) Clock Time (24 hr) 12:16 12:28 - - - - - - 13:58 14:06 Treatment Length: 110 (minutes) Treatment Segments: 4 Vital Signs Capillary Blood Glucose Reference Range: 80 - 120 mg / dl HBO Diabetic Blood Glucose Intervention Range: <131 mg/dl or >249 mg/dl Type: Time Vitals Blood Respiratory Capillary Blood Glucose Pulse Action Pulse: Temperature: Taken: Pressure: Rate: Glucose (mg/dl): Meter #: Oximetry (%) Taken: Pre 12:10 146/87 68 18 97.5 none per protocol Post 14:10 133/75 57 18 97.9 none per protocol Treatment Response Treatment Toleration: Well Treatment Completion Status: Treatment Completed without Adverse Event Treatment Notes Patient tolerated treatment well. Physician HBO Attestation: I certify  that I supervised this HBO treatment in accordance with Medicare guidelines. A trained emergency response team is readily available per Yes hospital policies and procedures. Continue HBOT as ordered. Yes Electronic Signature(s) Signed: 05/31/2022 3:46:09 PM By: [name] MD FACS Previous Signature: 05/31/2022 2:36:49 PM Version By: [name] CHT EMT BS , , Previous Signature: 05/31/2022 2:33:14 PM Version By: [name] CHT EMT BS , , Entered By: [name] on 05/31/2022 15:46:08 [name] ([id number]) [id number] Page 2 of 2 -------------------------------------------------------------------------------- HBO Safety Checklist Details Patient Name: Date of Service: Nathan [name] [name] 05/31/2022 1:00 PM Medical Record Number: [id number] Patient Account Number: [id number] Date of Birth/Sex: Treating RN: 17-May-1946 (76 y.o. [name] Primary Care Ronnell Makarewicz: [name] Other Clinician: [name] Referring Nylan Nakatani: Treating Yaira Bernardi/Extender: [name] in Treatment: 11 HBO Safety Checklist Items Safety Checklist Consent Form Signed Patient voided / foley secured and emptied When did you last eato 0930 Last dose of injectable or oral agent n/a Ostomy pouch emptied and vented if applicable NA All implantable devices assessed, documented and approved NA Intravenous access site secured and place NA Valuables secured Linens and cotton and cotton/polyester blend (less than 51% polyester) Personal oil-based products / skin lotions / body lotions removed Wigs or hairpieces removed NA Smoking or tobacco materials removed NA Books / newspapers / magazines / loose paper removed Cologne, aftershave, perfume and deodorant removed Jewelry removed (may wrap wedding band) Make-up removed NA Hair care products removed Battery operated devices (external) removed Heating patches and  chemical warmers removed Titanium eyewear removed Nail polish cured greater than 10 hours NA Casting material cured greater than 10 hours NA Hearing aids removed NA Loose dentures or partials removed dentures removed Prosthetics have been removed NA Patient demonstrates correct use of air break device (if applicable) Patient concerns have been addressed Patient grounding bracelet  on and cord attached to chamber Specifics for Inpatients (complete in addition to above) Medication sheet sent with patient NA Intravenous medications needed or due during therapy sent with patient NA Drainage tubes (e.g. nasogastric tube or chest tube secured and vented) NA Endotracheal or Tracheotomy tube secured NA Cuff deflated of air and inflated with saline NA Airway suctioned NA Notes Paper version used prior to treatment. Electronic Signature(s) Signed: 05/31/2022 2:31:38 PM By: [name] CHT EMT BS , , Entered By: [name] on 05/31/2022 14:31:37

## 2022-06-01 ENCOUNTER — Encounter (HOSPITAL_BASED_OUTPATIENT_CLINIC_OR_DEPARTMENT_OTHER): Payer: Medicare Other | Attending: General Surgery | Admitting: General Surgery

## 2022-06-01 DIAGNOSIS — Z923 Personal history of irradiation: Secondary | ICD-10-CM | POA: Insufficient documentation

## 2022-06-01 DIAGNOSIS — C61 Malignant neoplasm of prostate: Secondary | ICD-10-CM | POA: Diagnosis not present

## 2022-06-01 DIAGNOSIS — I10 Essential (primary) hypertension: Secondary | ICD-10-CM | POA: Diagnosis not present

## 2022-06-01 DIAGNOSIS — I480 Paroxysmal atrial fibrillation: Secondary | ICD-10-CM | POA: Insufficient documentation

## 2022-06-01 DIAGNOSIS — N3041 Irradiation cystitis with hematuria: Secondary | ICD-10-CM | POA: Insufficient documentation

## 2022-06-01 NOTE — Progress Notes (Signed)
SLEVIN, GUNBY ([id number]) [id number] Page 1 of 2 Visit Report for 06/01/2022 HBO Details Patient Name: Date of Service: Nathan [name] [name] 06/01/2022 1:00 PM Medical Record Number: [id number] Patient Account Number: [id number] Date of Birth/Sex: Treating RN: Mar 11, 1946 (76 y.o. 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 HBO Treatment Details Treatment Number: 53 Patient Type: Outpatient Chamber Type: Monoplace Chamber Serial #: [id number] Treatment Protocol: 2.0 ATA with 90 minutes oxygen, and no air breaks Treatment Details Compression Rate Down: 1.5 psi / minute De-Compression Rate Up: 2.0 psi / minute Air breaks and breathing Decompress Decompress Compress Tx Pressure Begins Reached periods Begins Ends (leave unused spaces blank) Chamber Pressure (ATA 1 2 ------2 1 ) Clock Time (24 hr) 12:24 12:34 - - - - - - 14:04 14:12 Treatment Length: 108 (minutes) Treatment Segments: 4 Vital Signs Capillary Blood Glucose Reference Range: 80 - 120 mg / dl HBO Diabetic Blood Glucose Intervention Range: <131 mg/dl or >249 mg/dl Type: Time Vitals Blood Respiratory Capillary Blood Glucose Pulse Action Pulse: Temperature: Taken: Pressure: Rate: Glucose (mg/dl): Meter #: Oximetry (%) Taken: Pre 12:20 157/78 76 20 98.1 none per protocol Post 14:14 146/86 64 20 97.4 none per protocol Treatment Response Treatment Toleration: Well Treatment Completion Status: Treatment Completed without Adverse Event Treatment Notes Patient arrived, dressed for treatment. Patient safely placed in chamber after  performing safety check. Chamber compressed at 1 psi/min until reaching 3 psig at which point rate set was changed to 2psi/min after patient denied problems with ear equalization. Patient tolerated compression, treatment, and decompression well. Decompression was at the rate set of 2 psi/min. Patient denied issues with ear equalization after treatment. Patient was stable upon discharge. Physician HBO Attestation: I certify that I supervised this HBO treatment in accordance with Medicare guidelines. A trained emergency response team is readily available per Yes hospital policies and procedures. Continue HBOT as ordered. Yes Electronic Signature(s) Signed: 06/01/2022 4:16:56 PM By: [name] MD FACS Previous Signature: 06/01/2022 4:05:44 PM Version By: [name] CHT EMT BS , , Entered By: [name] on 06/01/2022 16:16:55 [name] ([id number]) [id number] Page 2 of 2 -------------------------------------------------------------------------------- HBO Safety Checklist Details Patient Name: Date of Service: Nathan [name] [name] 06/01/2022 1:00 PM Medical Record Number: [id number] Patient Account Number: [id number] Date of Birth/Sex: Treating RN: 1945/12/26 (76 y.o. Nathan [name], Nathan [name] Primary Care Corneshia Hines: [name] Other Clinician: [name] Referring Bellami Farrelly: Treating Kehinde Totzke/Extender: [name] in Treatment: 11 HBO Safety Checklist Items Safety Checklist Consent Form Signed Patient voided / foley secured and emptied When did you last eato 0900 Last dose of injectable or oral agent n/a Ostomy pouch emptied and vented if applicable NA All implantable devices assessed, documented and approved NA Intravenous access site secured and place NA Valuables secured Linens and cotton and cotton/polyester blend (less than 51% polyester) Personal oil-based products / skin lotions / body lotions  removed Wigs or hairpieces removed NA Smoking or tobacco materials removed NA Books / newspapers / magazines / loose paper removed Cologne, aftershave, perfume and deodorant removed Jewelry removed (may wrap wedding band) Make-up removed NA Hair care products removed Battery operated devices (external) removed Heating patches and chemical warmers removed Titanium eyewear removed  Nail polish cured greater than 10 hours NA Casting material cured greater than 10 hours NA Hearing aids removed NA Loose dentures or partials removed NA Prosthetics have been removed NA Patient demonstrates correct use of air break device (if applicable) Patient concerns have been addressed Patient grounding bracelet on and cord attached to chamber Specifics for Inpatients (complete in addition to above) Medication sheet sent with patient NA Intravenous medications needed or due during therapy sent with patient NA Drainage tubes (e.g. nasogastric tube or chest tube secured and vented) NA Endotracheal or Tracheotomy tube secured NA Cuff deflated of air and inflated with saline NA Airway suctioned NA Notes Paper version used prior to treatment. Electronic Signature(s) Signed: 06/01/2022 4:02:33 PM By: [name] CHT EMT BS , , Entered By: [name] on 06/01/2022 16:02:33

## 2022-06-01 NOTE — Progress Notes (Addendum)
QUARAN, Nathan [name] ([id number]) [id number] Page 1 of 2 Visit Report for 06/01/2022 Arrival Information Details Patient Name: Date of Service: Nathan [name] [name] 06/01/2022 1:00 PM Medical Record Number: [id number] Patient Account Number: [id number] Date of Birth/Sex: Treating RN: 31-Jul-1945 (76 y.o. Nathan [name], [name] Primary Care Deandrea Rion: [name] Other Clinician: [name] Referring Laxmi Choung: Treating Toba Claudio/Extender: [name] in Treatment: 11 Visit Information History Since Last Visit All ordered tests and consults were completed: Yes Patient Arrived: Nathan [name] Added or deleted any medications: No Arrival Time: 12:06 Any new allergies or adverse reactions: No Accompanied By: self Had a fall or experienced change in No Transfer Assistance: None activities of daily living that may affect Patient Identification Verified: Yes risk of falls: Secondary Verification Process Completed: Yes Signs or symptoms of abuse/neglect since last visito No Patient Requires Transmission-Based Precautions: No Hospitalized since last visit: No Patient Has Alerts: No Implantable device outside of the clinic excluding No cellular tissue based products placed in the center since last visit: Pain Present Now: No Electronic Signature(s) Signed: 06/01/2022 4:07:00 PM By: [name] CHT EMT BS , , Previous Signature: 06/01/2022 4:01:09 PM Version By: [name] CHT EMT BS , , Entered By: [name] on 06/01/2022 16:07:00 -------------------------------------------------------------------------------- Encounter Discharge Information Details Patient Name: Date of Service: Nathan [name], [name] 06/01/2022 1:00 PM Medical Record Number: [id number] Patient Account Number: [id number] Date of Birth/Sex: Treating RN: 1945/12/23 (76 y.o. Nathan [name], [name] Primary Care Jordin Vicencio: [name] Other Clinician: [name] Referring Dinorah Masullo: Treating Viraat Vanpatten/Extender: [name] in Treatment: 11 Encounter Discharge Information Items Discharge Condition: Stable Ambulatory Status: Cane Discharge Destination: Home Transportation: Private Auto Accompanied By: self Schedule Follow-up Appointment: No Clinical Summary of Care: Electronic Signature(s) Signed: 06/01/2022 4:06:29 PM By: [name] CHT EMT BS , , Entered By: [name] on 06/01/2022 16:06:29 [name] ([id number]) [id number] Page 2 of 2 -------------------------------------------------------------------------------- Multi-Disciplinary Care Plan Details Patient Name: Date of Service: Nathan [name] [name] 06/01/2022 1:00 PM Medical Record Number: [id number] Patient Account Number: [id number] Date of Birth/Sex: Treating RN: [birth year] (76 y.o. 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 and treatment procedures Goals: Patient will tolerate the hyperbaric oxygen therapy treatment Date Initiated: 03/14/2022 Target Resolution Date: 08/03/2022 Goal Status: Active Interventions: Administer decongestants, per physician orders, prior to HBO2 Notes: Electronic Signature(s) Signed: 06/01/2022 3:37:43 PM By: Adline Peals Entered By: Adline Peals on 06/01/2022 15:37:19 -------------------------------------------------------------------------------- [place] Details Patient Name: Date of Service: Nathan [name], [name] 06/01/2022 1:00 PM Medical Record Number: [id number] Patient Account Number: [id number] Date of Birth/Sex: Treating RN: 12-25-1945 (76 y.o. Nathan [name] Primary Care Cambell Stanek: [name] Other Clinician:  [name] Referring Rodolphe Edmonston: Treating Aanchal Cope/Extender: [name] in Treatment: 11 Vital Signs Time Taken: 12:20 Temperature (F): 98.1 Weight (lbs): 183 Pulse (bpm): 76 Respiratory Rate (breaths/min): 20 Blood Pressure (mmHg): 157/78 Reference Range: 80 - 120 mg / dl Electronic Signature(s) Signed: 06/01/2022 4:01:36 PM By: [name] CHT EMT BS , , Entered By: [name] on 06/01/2022 16:01:36

## 2022-06-01 NOTE — Progress Notes (Signed)
AGASTYA, MEISTER ([id number]) [id number] Page 1 of 1 Visit Report for 06/01/2022 SuperBill Details Patient Name: Date of Service: Nathan [name] [name] 06/01/2022 Medical Record Number: [id number] Patient Account Number: [id number] Date of Birth/Sex: Treating RN: Oct 02, 1945 (76 y.o. 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

## 2022-06-04 ENCOUNTER — Encounter (HOSPITAL_BASED_OUTPATIENT_CLINIC_OR_DEPARTMENT_OTHER): Payer: Medicare Other | Admitting: Internal Medicine

## 2022-06-04 DIAGNOSIS — C61 Malignant neoplasm of prostate: Secondary | ICD-10-CM

## 2022-06-04 DIAGNOSIS — N3041 Irradiation cystitis with hematuria: Secondary | ICD-10-CM | POA: Diagnosis not present

## 2022-06-04 DIAGNOSIS — Z923 Personal history of irradiation: Secondary | ICD-10-CM

## 2022-06-04 NOTE — Progress Notes (Signed)
TAL, KEMPKER ([id number]) [id number] Page 1 of 2 Visit Report for 06/04/2022 Arrival Information Details Patient Name: Date of Service: Nathan [name] Nathan [name] 06/04/2022 1:00 PM Medical Record Number: [id number] Patient Account Number: [id number] Date of Birth/Sex: Treating RN: 06/09/1946 (76 y.o. [name], [name] Primary Care Kimberli Winne: [name] Other Clinician: [name] Referring Dorethia Jeanmarie: Treating Antonia Culbertson/Extender: [name] in Treatment: 11 Visit Information History Since Last Visit All ordered tests and consults were completed: Yes Patient Arrived: [name] Added or deleted any medications: No Arrival Time: 12:14 Any new allergies or adverse reactions: No Accompanied By: self Had a fall or experienced change in No Transfer Assistance: None activities of daily living that may affect Patient Identification Verified: Yes risk of falls: Secondary Verification Process Completed: Yes Signs or symptoms of abuse/neglect since last visito No Patient Requires Transmission-Based Precautions: No Hospitalized since last visit: No Patient Has Alerts: No Implantable device outside of the clinic excluding No cellular tissue based products placed in the center since last visit: Pain Present Now: No Electronic Signature(s) Signed: 06/04/2022 4:39:37 PM By: [name] CHT EMT BS , , Previous Signature: 06/04/2022 4:38:29 PM Version By: [name] CHT EMT BS , , Previous Signature: 06/04/2022 4:38:11 PM Version By: [name] CHT EMT BS , , Entered By: [name] on 06/04/2022 16:39:37 -------------------------------------------------------------------------------- Encounter Discharge Information Details Patient Name: Date of Service: Nathan [name], Nathan [name] Nathan [name] 06/04/2022 1:00 PM Medical Record Number: [id number] Patient Account Number: [id number] Date of Birth/Sex: Treating RN: 1946-02-05 (76 y.o. [name], [name] Primary Care Jarman Litton: [name] Other Clinician: [name] Referring Britny Riel: Treating Saren Corkern/Extender: [name] in Treatment: 11 Encounter Discharge Information Items Discharge Condition: Stable Ambulatory Status: Cane Discharge Destination: Home Transportation: Private Auto Accompanied By: self Schedule Follow-up Appointment: No Clinical Summary of Care: Electronic Signature(s) Signed: 06/04/2022 4:43:02 PM By: [name] CHT EMT BS , , Entered By: [name] on 06/04/2022 16:43:01 Nathan [name] ([id number]) [id number] Page 2 of 2 -------------------------------------------------------------------------------- Vitals Details Patient Name: Date of Service: Nathan [name] Nathan [name] 06/04/2022 1:00 PM Medical Record Number: [id number] Patient Account Number: [id number] Date of Birth/Sex: Treating RN: [birth year] (76 y.o. [name], [name] Primary Care Deyanira Fesler: [name] Other Clinician: [name] Referring Azizah Lisle: Treating Shalayna Ornstein/Extender: [name] in Treatment: 11 Vital Signs Time Taken: 12:25 Temperature (F): 97.5 Weight (lbs): 183 Pulse (bpm): 91 Respiratory Rate (breaths/min): 20 Blood Pressure (mmHg): 143/85 Reference Range: 80 - 120 mg / dl Electronic Signature(s) Signed: 06/04/2022 4:39:17 PM By: [name] CHT EMT BS , , Entered By: [name] on 06/04/2022 16:39:17

## 2022-06-04 NOTE — Progress Notes (Signed)
RAEQUAN, VANSCHAICK ([id number]) [id number] Page 1 of 2 Visit Report for 06/04/2022 HBO Details Patient Name: Date of Service: Nathan [name] [name] 06/04/2022 1:00 PM Medical Record Number: [id number] Patient Account Number: [id number] Date of Birth/Sex: Treating RN: 1945/09/11 (76 y.o. 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 HBO Treatment Details Treatment Number: 54 Patient Type: Outpatient Chamber Type: Monoplace Chamber Serial #: [id number] Treatment Protocol: 2.0 ATA with 90 minutes oxygen, and no air breaks Treatment Details Compression Rate Down: 2.0 psi / minute De-Compression Rate Up: 2.0 psi / minute Air breaks and breathing Decompress Decompress Compress Tx Pressure Begins Reached periods Begins Ends (leave unused spaces blank) Chamber Pressure (ATA 1 2 ------2 1 ) Clock Time (24 hr) 12:32 12:40 - - - - - - 14:10 14:18 Treatment Length: 106 (minutes) Treatment Segments: 4 Vital Signs Capillary Blood Glucose Reference Range: 80 - 120 mg / dl HBO Diabetic Blood Glucose Intervention Range: <131 mg/dl or >249 mg/dl Type: Time Vitals Blood Respiratory Capillary Blood Glucose Pulse Action Pulse: Temperature: Taken: Pressure: Rate: Glucose (mg/dl): Meter #: Oximetry (%) Taken: Pre 12:25 143/85 91 20 97.5 none per protocol Post 14:21 134/82 62 18 98 none per protocol Treatment Response Treatment Toleration: Well Treatment Completion Status: Treatment Completed without Adverse Event Physician HBO Attestation: I certify that I supervised this HBO treatment in accordance with  Medicare guidelines. A trained emergency response team is readily available per Yes hospital policies and procedures. Continue HBOT as ordered. Yes Electronic Signature(s) Signed: 06/05/2022 12:20:03 PM By: Kalman Shan DO Previous Signature: 06/04/2022 4:41:42 PM Version By: [name] CHT EMT BS , , Entered By: Kalman Shan on 06/05/2022 11:35:43 [name] ([id number]) [id number] Page 2 of 2 -------------------------------------------------------------------------------- HBO Safety Checklist Details Patient Name: Date of Service: Nathan [name] [name] 06/04/2022 1:00 PM Medical Record Number: [id number] Patient Account Number: [id number] Date of Birth/Sex: Treating RN: [birth year] (76 y.o. [name], [name] Primary Care Lizzette Carbonell: [name] Other Clinician: [name] Referring Enyla Lisbon: Treating Laryn Venning/Extender: [name] in Treatment: 11 HBO Safety Checklist Items Safety Checklist Consent Form Signed Patient voided / foley secured and emptied When did you last eato 1000 Last dose of injectable or oral agent n/a Ostomy pouch emptied and vented if applicable NA All implantable devices assessed, documented and approved NA Intravenous access site secured and place NA Valuables secured Linens and cotton and cotton/polyester blend (less than 51% polyester) Personal oil-based products / skin lotions / body lotions removed Wigs or hairpieces removed NA Smoking or tobacco materials removed NA Books / newspapers / magazines / loose paper removed Cologne, aftershave, perfume and deodorant removed Jewelry removed (may wrap wedding band) Make-up removed NA Hair care products removed Battery operated devices (external) removed Heating patches and chemical warmers removed Titanium eyewear removed Nail polish cured greater than 10 hours NA Casting material cured greater than 10 hours NA Hearing  aids removed NA Loose dentures or partials removed dentures removed Prosthetics have been removed NA Patient demonstrates correct use of air break device (if applicable) Patient concerns have been addressed Patient grounding bracelet on and cord attached to chamber Specifics for Inpatients (complete in addition to above) Medication sheet sent with patient NA Intravenous  medications needed or due during therapy sent with patient NA Drainage tubes (e.g. nasogastric tube or chest tube secured and vented) NA Endotracheal or Tracheotomy tube secured NA Cuff deflated of air and inflated with saline NA Airway suctioned NA Notes Paper version used prior to treatment. Electronic Signature(s) Signed: 06/04/2022 4:40:45 PM By: [name] CHT EMT BS , , Entered By: [name] on 06/04/2022 16:40:44

## 2022-06-05 ENCOUNTER — Encounter (HOSPITAL_BASED_OUTPATIENT_CLINIC_OR_DEPARTMENT_OTHER): Payer: Medicare Other | Admitting: Internal Medicine

## 2022-06-05 DIAGNOSIS — N3041 Irradiation cystitis with hematuria: Secondary | ICD-10-CM

## 2022-06-05 DIAGNOSIS — Z923 Personal history of irradiation: Secondary | ICD-10-CM | POA: Diagnosis not present

## 2022-06-05 DIAGNOSIS — C61 Malignant neoplasm of prostate: Secondary | ICD-10-CM | POA: Diagnosis not present

## 2022-06-05 NOTE — Progress Notes (Signed)
HELIX, LAFONTAINE ([id number]) [id number] Page 1 of 2 Visit Report for 06/05/2022 Arrival Information Details Patient Name: Date of Service: Nathan [name] [name] 06/05/2022 1:00 PM Medical Record Number: [id number] Patient Account Number: [id number] Date of Birth/Sex: Treating RN: 10-Sep-1945 (76 y.o. Nathan [name] Primary Care Brynda Heick: [name] Other Clinician: [name] Referring Etienne Mowers: Treating Floyd Lusignan/Extender: [name] in Treatment: 11 Visit Information History Since Last Visit All ordered tests and consults were completed: Yes Patient Arrived: Nathan [name] Added or deleted any medications: No Arrival Time: 11:59 Any new allergies or adverse reactions: No Accompanied By: self Had a fall or experienced change in No Transfer Assistance: None activities of daily living that may affect Patient Identification Verified: Yes risk of falls: Secondary Verification Process Completed: Yes Signs or symptoms of abuse/neglect since last visito No Patient Requires Transmission-Based Precautions: No Hospitalized since last visit: No Patient Has Alerts: No Implantable device outside of the clinic excluding No cellular tissue based products placed in the center since last visit: Pain Present Now: No Electronic Signature(s) Signed: 06/05/2022 3:43:55 PM By: [name] CHT EMT BS , , Entered By: [name] on 06/05/2022 15:43:54 -------------------------------------------------------------------------------- Encounter Discharge Information Details Patient Name: Date of Service: Nathan [name], [name] 06/05/2022 1:00 PM Medical Record Number: [id number] Patient Account Number: [id number] Date of Birth/Sex: Treating RN: 10-08-1945 (76 y.o. Nathan [name] Primary Care Evea Sheek: [name] Other Clinician: [name] Referring Kylii Ennis: Treating Kenzlie Disch/Extender: [name]  in Treatment: 11 Encounter Discharge Information Items Discharge Condition: Stable Ambulatory Status: Cane Discharge Destination: Home Transportation: Private Auto Accompanied By: self Schedule Follow-up Appointment: No Clinical Summary of Care: Electronic Signature(s) Signed: 06/05/2022 3:49:12 PM By: [name] CHT EMT BS , , Entered By: [name] on 06/05/2022 Nathan [name] ([id number]) [id number] Page 2 of 2 -------------------------------------------------------------------------------- Vitals Details Patient Name: Date of Service: Nathan [name] [name] 06/05/2022 1:00 PM Medical Record Number: [id number] Patient Account Number: [id number] Date of Birth/Sex: Treating RN: June 26, 1946 (76 y.o. Nathan [name] Primary Care Bret Vanessen: [name] Other Clinician: [name] Referring Alfretta Pinch: Treating Tamina Cyphers/Extender: [name] in Treatment: 11 Vital Signs Time Taken: 13:01 Temperature (F): 97.5 Weight (lbs): 183 Pulse (bpm): 69 Respiratory Rate (breaths/min): 20 Blood Pressure (mmHg): 151/77 Reference Range: 80 - 120 mg / dl Electronic Signature(s) Signed: 06/05/2022 3:45:25 PM By: [name] CHT EMT BS , , Entered By: [name] on 06/05/2022 15:45:25

## 2022-06-05 NOTE — Progress Notes (Signed)
RONTAVIOUS, ALBRIGHT ([id number]) [id number] Page 1 of 1 Visit Report for 06/04/2022 SuperBill Details Patient Name: Date of Service: Nathan [name] [name] 06/04/2022 Medical Record Number: [id number] Patient Account Number: [id number] Date of Birth/Sex: Treating RN: [birth year] (76 y.o. 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

## 2022-06-05 NOTE — Progress Notes (Signed)
TRIG, MCBRYAR ([id number]) [id number] Page 1 of 2 Visit Report for 06/05/2022 HBO Details Patient Name: Date of Service: Nathan [name] [name] 06/05/2022 1:00 PM Medical Record Number: [id number] Patient Account Number: [id number] Date of Birth/Sex: Treating RN: June 27, 1946 (76 y.o. 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 HBO Treatment Details Treatment Number: 55 Patient Type: Outpatient Chamber Type: Monoplace Chamber Serial #: [id number] Treatment Protocol: 2.0 ATA with 90 minutes oxygen, and no air breaks Treatment Details Compression Rate Down: 2.0 psi / minute De-Compression Rate Up: 2.0 psi / minute Air breaks and breathing Decompress Decompress Compress Tx Pressure Begins Reached periods Begins Ends (leave unused spaces blank) Chamber Pressure (ATA 1 2 ------2 1 ) Clock Time (24 hr) 13:06 13:14 - - - - - - 14:45 14:52 Treatment Length: 106 (minutes) Treatment Segments: 4 Vital Signs Capillary Blood Glucose Reference Range: 80 - 120 mg / dl HBO Diabetic Blood Glucose Intervention Range: <131 mg/dl or >249 mg/dl Type: Time Vitals Blood Respiratory Capillary Blood Glucose Pulse Action Pulse: Temperature: Taken: Pressure: Rate: Glucose (mg/dl): Meter #: Oximetry (%) Taken: Pre 13:01 151/77 69 20 97.5 none per protocol Post 14:56 139/79 62 18 97.3 none per protocol Treatment Response Treatment Toleration: Well Treatment Completion Status: Treatment Completed without Adverse Event Treatment Notes Patient prepared for treatment. Patient thought today was his last treatment.  Today is Treatment # 89. Patient safely placed in chamber after safety check was performed. Compression was at a rate of 1 psi/min until reaching 3 psig at which time patient denied any issues with ear equalization and rate set was increased to 2 psi/min. Patient tolerated treatment well and decompression phase was at a rate of 2 psi/min as well. Patient denied any issues with ear equalization saying he felt fine. Patient was stable upon discharge. Physician HBO Attestation: I certify that I supervised this HBO treatment in accordance with Medicare guidelines. A trained emergency response team is readily available per Yes hospital policies and procedures. Continue HBOT as ordered. Yes Electronic Signature(s) Signed: 06/06/2022 12:34:39 PM By: Kalman Shan DO Previous Signature: 06/05/2022 4:54:13 PM Version By: [name] CHT EMT BS , , Previous Signature: 06/05/2022 3:48:27 PM Version By: [name] CHT EMT BS , , Entered By: Kalman Shan on 06/06/2022 12:32:39 [name] ([id number]) [id number] Page 2 of 2 -------------------------------------------------------------------------------- HBO Safety Checklist Details Patient Name: Date of Service: Nathan [name] [name] 06/05/2022 1:00 PM Medical Record Number: [id number] Patient Account Number: [id number] Date of Birth/Sex: Treating RN: 1946-01-20 (76 y.o. [name] Primary Care Shirl Ludington: [name] Other Clinician: [name] Referring Everardo Voris: Treating Briena Swingler/Extender: [name] in Treatment: 11 HBO Safety Checklist Items Safety Checklist Consent Form Signed Patient voided / foley secured and emptied When did you last eato 0930 Last dose of injectable or oral agent n/a Ostomy pouch emptied and vented if applicable NA All implantable devices assessed, documented and approved NA Intravenous access site secured and place NA Valuables  secured Linens and cotton and cotton/polyester blend (less than 51% polyester) Personal oil-based products / skin lotions / body lotions removed Wigs or hairpieces removed NA Smoking or tobacco materials removed NA Books / newspapers / magazines / loose paper removed  Cologne, aftershave, perfume and deodorant removed Jewelry removed (may wrap wedding band) Make-up removed NA Hair care products removed Battery operated devices (external) removed Heating patches and chemical warmers removed Titanium eyewear removed Nail polish cured greater than 10 hours NA Casting material cured greater than 10 hours NA Hearing aids removed NA Loose dentures or partials removed Prosthetics have been removed NA Patient demonstrates correct use of air break device (if applicable) Patient concerns have been addressed Patient grounding bracelet on and cord attached to chamber Specifics for Inpatients (complete in addition to above) Medication sheet sent with patient NA Intravenous medications needed or due during therapy sent with patient NA Drainage tubes (e.g. nasogastric tube or chest tube secured and vented) NA Endotracheal or Tracheotomy tube secured NA Cuff deflated of air and inflated with saline NA Airway suctioned NA Notes Paper version used prior to treatment. Electronic Signature(s) Signed: 06/05/2022 3:47:17 PM By: [name] CHT EMT BS , , Entered By: [name] on 06/05/2022 15:47:17

## 2022-06-06 ENCOUNTER — Encounter: Payer: Self-pay | Admitting: Internal Medicine

## 2022-06-06 ENCOUNTER — Encounter (HOSPITAL_BASED_OUTPATIENT_CLINIC_OR_DEPARTMENT_OTHER): Payer: Medicare Other | Admitting: General Surgery

## 2022-06-06 DIAGNOSIS — N3041 Irradiation cystitis with hematuria: Secondary | ICD-10-CM | POA: Diagnosis not present

## 2022-06-06 NOTE — Progress Notes (Signed)
ERAGON, HAMMOND ([id number]) [id number] Page 1 of 1 Visit Report for 06/05/2022 SuperBill Details Patient Name: Date of Service: Nathan [name] [name] 06/05/2022 Medical Record Number: [id number] Patient Account Number: [id number] Date of Birth/Sex: Treating RN: 1945/07/23 (76 y.o. 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

## 2022-06-06 NOTE — Progress Notes (Signed)
DAKARRI, KESSINGER ([id number]) [id number] Page 1 of 2 Visit Report for 06/06/2022 Arrival Information Details Patient Name: Date of Service: Nathan [name] [name] 06/06/2022 1:00 PM Medical Record Number: [id number] Patient Account Number: [id number] Date of Birth/Sex: Treating RN: [birth year] (76 y.o. [name], [name] Primary Care Jorrell Kuster: [name] Other Clinician: [name] Referring Linh Johannes: Treating Nisreen Guise/Extender: [name] in Treatment: 12 Visit Information History Since Last Visit All ordered tests and consults were completed: Yes Patient Arrived: [name] Added or deleted any medications: No Arrival Time: 12:23 Any new allergies or adverse reactions: No Accompanied By: self Had a fall or experienced change in No Transfer Assistance: None activities of daily living that may affect Patient Identification Verified: Yes risk of falls: Secondary Verification Process Completed: Yes Signs or symptoms of abuse/neglect since last visito No Patient Requires Transmission-Based Precautions: No Hospitalized since last visit: No Patient Has Alerts: No Implantable device outside of the clinic excluding No cellular tissue based products placed in the center since last visit: Pain Present Now: No Electronic Signature(s) Signed: 06/06/2022 3:04:34 PM By: [name] CHT EMT BS , , Entered By: [name] on 06/06/2022 15:04:34 -------------------------------------------------------------------------------- Encounter Discharge Information Details Patient Name: Date of Service: Nathan [name], [name] 06/06/2022 1:00 PM Medical Record Number: [id number] Patient Account Number: [id number] Date of Birth/Sex: Treating RN: [birth year] (76 y.o. Nathan [name] Primary Care Dustina Scoggin: [name] Other Clinician: [name] Referring Latrica Clowers: Treating Keyvin Rison/Extender: [name]  in Treatment: 12 Encounter Discharge Information Items Discharge Condition: Stable Ambulatory Status: Cane Discharge Destination: Home Transportation: Private Auto Accompanied By: self Schedule Follow-up Appointment: No Clinical Summary of Care: Electronic Signature(s) Signed: 06/06/2022 3:10:12 PM By: [name] CHT EMT BS , , Entered By: [name] on 06/06/2022 15:10:12 [name] ([id number]) [id number] Page 2 of 2 -------------------------------------------------------------------------------- Vitals Details Patient Name: Date of Service: Nathan [name] [name] 06/06/2022 1:00 PM Medical Record Number: [id number] Patient Account Number: [id number] Date of Birth/Sex: Treating RN: 17-Jan-1946 (76 y.o. Nathan [name] Primary Care Allena Pietila: [name] Other Clinician: [name] Referring Lucia Harm: Treating Nathan [name]/Extender: [name] in Treatment: 12 Vital Signs Time Taken: 12:50 Temperature (F): 97.7 Weight (lbs): 183 Pulse (bpm): 71 Respiratory Rate (breaths/min): 18 Blood Pressure (mmHg): 162/94 Reference Range: 80 - 120 mg / dl Electronic Signature(s) Signed: 06/06/2022 3:05:07 PM By: [name] CHT EMT BS , , Entered By: [name] on 06/06/2022 15:05:07

## 2022-06-07 ENCOUNTER — Encounter (HOSPITAL_BASED_OUTPATIENT_CLINIC_OR_DEPARTMENT_OTHER): Payer: Medicare Other | Admitting: General Surgery

## 2022-06-07 DIAGNOSIS — N3041 Irradiation cystitis with hematuria: Secondary | ICD-10-CM | POA: Diagnosis not present

## 2022-06-07 NOTE — Progress Notes (Signed)
KADYN, GUILD ([id number]) [id number] Page 1 of 1 Visit Report for 06/06/2022 SuperBill Details Patient Name: Date of Service: Nathan [name] [name] 06/06/2022 Medical Record Number: [id number] Patient Account Number: [id number] Date of Birth/Sex: Treating RN: 1945-09-08 (76 y.o. 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

## 2022-06-07 NOTE — Progress Notes (Signed)
[name], [name] ([id number]) [id number] Page 1 of 2 Visit Report for 06/06/2022 HBO Details Patient Name: Date of Service: Nathan [name] [name] 06/06/2022 1:00 PM Medical Record Number: [id number] Patient Account Number: [id number] Date of Birth/Sex: Treating RN: [birth year] (76 y.o. 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 HBO Treatment Details Treatment Number: 56 Patient Type: Outpatient Chamber Type: Monoplace Chamber Serial #: [id number] Treatment Protocol: 2.0 ATA with 90 minutes oxygen, and no air breaks Treatment Details Compression Rate Down: 2.0 psi / minute De-Compression Rate Up: 2.0 psi / minute Air breaks and breathing Decompress Decompress Compress Tx Pressure Begins Reached periods Begins Ends (leave unused spaces blank) Chamber Pressure (ATA 1 2 ------2 1 ) Clock Time (24 hr) 12:55 13:03 - - - - - - 14:33 14:41 Treatment Length: 106 (minutes) Treatment Segments: 4 Vital Signs Capillary Blood Glucose Reference Range: 80 - 120 mg / dl HBO Diabetic Blood Glucose Intervention Range: <131 mg/dl or >249 mg/dl Type: Time Vitals Blood Respiratory Capillary Blood Glucose Pulse Action Pulse: Temperature: Taken: Pressure: Rate: Glucose (mg/dl): Meter #: Oximetry (%) Taken: Pre 12:50 162/94 71 18 97.7 none per protocol Post 14:45 154/83 61 18 97.3 none per protocol Treatment Response Treatment Toleration: Well Treatment Completion Status: Treatment Completed without Adverse Event Treatment Notes Patient travelled at 2 psi/min after patient denied any issues with ear  equalization. Patient tolerated treatment. Decompression of the chamber proceeded at a rate of 2 psi/min. Patient denied any problems today stating that he felt fine. Patient was stable upon discharge. Physician HBO Attestation: I certify that I supervised this HBO treatment in accordance with Medicare guidelines. A trained emergency response team is readily available per Yes hospital policies and procedures. Continue HBOT as ordered. Yes Electronic Signature(s) Signed: 06/06/2022 5:06:15 PM By: [name] MD FACS Previous Signature: 06/06/2022 3:12:41 PM Version By: [name] CHT EMT BS , , Previous Signature: 06/06/2022 3:09:10 PM Version By: [name] CHT EMT BS , , Previous Signature: 06/06/2022 3:07:59 PM Version By: [name] CHT EMT BS , , Entered By: [name] on 06/06/2022 17:06:15 [name] ([id number]) [id number] Page 2 of 2 -------------------------------------------------------------------------------- HBO Safety Checklist Details Patient Name: Date of Service: Nathan [name] [name] 06/06/2022 1:00 PM Medical Record Number: [id number] Patient Account Number: [id number] Date of Birth/Sex: Treating RN: 1945/07/06 (76 y.o. Nathan [name], [name] Primary Care Perle Brickhouse: [name] Other Clinician: [name] Referring Kayela Humphres: Treating Khyran Riera/Extender: [name] in Treatment: 12 HBO Safety Checklist Items Safety Checklist Consent Form Signed Patient voided / foley secured and emptied When did you last eato 1000 Last dose of injectable or oral agent n/a Ostomy pouch emptied and vented if applicable NA All implantable devices assessed, documented and approved NA Intravenous access site secured and place NA Valuables secured Linens and cotton and cotton/polyester blend (less than 51% polyester) Personal oil-based products / skin lotions / body lotions removed Wigs or  hairpieces removed NA Smoking or tobacco materials removed NA Books / newspapers / magazines / loose paper removed Cologne, aftershave, perfume and deodorant removed Jewelry removed (may wrap wedding band) Make-up removed NA Hair care products removed Battery operated devices (external) removed Heating patches and chemical warmers removed Titanium  eyewear removed Nail polish cured greater than 10 hours NA Casting material cured greater than 10 hours NA Hearing aids removed NA Loose dentures or partials removed dentures removed Prosthetics have been removed NA Patient demonstrates correct use of air break device (if applicable) Patient concerns have been addressed Patient grounding bracelet on and cord attached to chamber Specifics for Inpatients (complete in addition to above) Medication sheet sent with patient NA Intravenous medications needed or due during therapy sent with patient NA Drainage tubes (e.g. nasogastric tube or chest tube secured and vented) NA Endotracheal or Tracheotomy tube secured NA Cuff deflated of air and inflated with saline NA Airway suctioned NA Notes Paper version used prior to treatment. Electronic Signature(s) Signed: 06/06/2022 3:06:07 PM By: [name] CHT EMT BS , , Entered By: [name] on 06/06/2022 15:06:06

## 2022-06-08 ENCOUNTER — Encounter (HOSPITAL_BASED_OUTPATIENT_CLINIC_OR_DEPARTMENT_OTHER): Payer: Medicare Other | Admitting: General Surgery

## 2022-06-08 DIAGNOSIS — N3041 Irradiation cystitis with hematuria: Secondary | ICD-10-CM | POA: Diagnosis not present

## 2022-06-08 NOTE — Progress Notes (Signed)
ERUBIEL, MANASCO ([id number]) [id number] Page 1 of 2 Visit Report for 06/07/2022 HBO Details Patient Name: Date of Service: Nathan [name] [name] 06/07/2022 1:00 PM Medical Record Number: [id number] Patient Account Number: [id number] Date of Birth/Sex: Treating RN: [birth year] (75 y.o. 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 HBO Treatment Details Treatment Number: 57 Patient Type: Outpatient Chamber Type: Monoplace Chamber Serial #: [id number] Treatment Protocol: 2.0 ATA with 90 minutes oxygen, and no air breaks Treatment Details Compression Rate Down: 2.0 psi / minute De-Compression Rate Up: 2.0 psi / minute Air breaks and breathing Decompress Decompress Compress Tx Pressure Begins Reached periods Begins Ends (leave unused spaces blank) Chamber Pressure (ATA 1 2 ------2 1 ) Clock Time (24 hr) 12:31 12:40 - - - - - - 14:10 14:17 Treatment Length: 106 (minutes) Treatment Segments: 4 Vital Signs Capillary Blood Glucose Reference Range: 80 - 120 mg / dl HBO Diabetic Blood Glucose Intervention Range: <131 mg/dl or >249 mg/dl Type: Time Vitals Blood Respiratory Capillary Blood Glucose Pulse Action Pulse: Temperature: Taken: Pressure: Rate: Glucose (mg/dl): Meter #: Oximetry (%) Taken: Pre 12:24 175/99 72 18 97.9 none per protocol Post 14:20 154/82 59 18 97.5 none per protocol Treatment Response Treatment Toleration: Well Treatment Completion Status: Treatment Completed without Adverse Event Physician HBO Attestation: I certify that I supervised this HBO treatment in accordance with  Medicare guidelines. A trained emergency response team is readily available per Yes hospital policies and procedures. Continue HBOT as ordered. Yes Electronic Signature(s) Signed: 06/07/2022 3:44:10 PM By: [name] MD FACS Previous Signature: 06/07/2022 3:36:30 PM Version By: [name] CHT EMT BS , , Entered By: [name] on 06/07/2022 15:44:10 [name] ([id number]) [id number] Page 2 of 2 -------------------------------------------------------------------------------- HBO Safety Checklist Details Patient Name: Date of Service: Nathan [name] [name] 06/07/2022 1:00 PM Medical Record Number: [id number] Patient Account Number: [id number] Date of Birth/Sex: Treating RN: 1946-01-05 (76 y.o. [name] Primary Care Marquay Kruse: [name] Other Clinician: [name] Referring Donia Yokum: Treating Monnica Saltsman/Extender: [name] in Treatment: 12 HBO Safety Checklist Items Safety Checklist Consent Form Signed Patient voided / foley secured and emptied When did you last eato 1000 Last dose of injectable or oral agent n/a Ostomy pouch emptied and vented if applicable NA All implantable devices assessed, documented and approved NA Intravenous access site secured and place NA Valuables secured Linens and cotton and cotton/polyester blend (less than 51% polyester) Personal oil-based products / skin lotions / body lotions removed Wigs or hairpieces removed NA Smoking or tobacco materials removed NA Books / newspapers / magazines / loose paper removed Cologne, aftershave, perfume and deodorant removed Jewelry removed (may wrap wedding band) Make-up removed NA Hair care products removed Battery operated devices (external) removed Heating patches and chemical warmers removed Titanium eyewear removed Nail polish cured greater than 10 hours NA Casting material cured greater than 10 hours NA Hearing  aids removed NA Loose dentures or partials removed dentures removed Prosthetics have been removed NA Patient demonstrates correct use of air break device (if applicable) Patient concerns have been addressed Patient grounding bracelet on and cord attached to chamber Specifics for Inpatients (complete in addition to above) Medication sheet sent with patient NA  Intravenous medications needed or due during therapy sent with patient NA Drainage tubes (e.g. nasogastric tube or chest tube secured and vented) NA Endotracheal or Tracheotomy tube secured NA Cuff deflated of air and inflated with saline NA Airway suctioned NA Notes Paper version used prior to treatment. Electronic Signature(s) Signed: 06/07/2022 3:35:42 PM By: [name] CHT EMT BS , , Entered By: [name] on 06/07/2022 15:35:41

## 2022-06-08 NOTE — Progress Notes (Signed)
AKSH, SWART ([id number]) [id number] Page 1 of 2 Visit Report for 06/07/2022 Arrival Information Details Patient Name: Date of Service: Nathan [name] [name] 06/07/2022 1:00 PM Medical Record Number: [id number] Patient Account Number: [id number] Date of Birth/Sex: Treating RN: 1946/02/07 (76 y.o. Nathan [name] Primary Care Nathan [name]: Nathan [name] Other Clinician: Valeria [name] Referring Nathan [name]: Treating Nathan [name]/Extender: Nathan [name] in Treatment: 12 Visit Information History Since Last Visit All ordered tests and consults were completed: Yes Patient Arrived: Nathan [name] Added or deleted any medications: No Arrival Time: 12:01 Any new allergies or adverse reactions: No Accompanied By: self Had a fall or experienced change in No Transfer Assistance: None activities of daily living that may affect Patient Identification Verified: Yes risk of falls: Secondary Verification Process Completed: Yes Signs or symptoms of abuse/neglect since last visito No Patient Requires Transmission-Based Precautions: No Hospitalized since last visit: No Patient Has Alerts: No Implantable device outside of the clinic excluding No cellular tissue based products placed in the center since last visit: Pain Present Now: No Electronic Signature(s) Signed: 06/07/2022 3:34:08 PM By: Nathan [name] CHT EMT BS , , Entered By: Nathan [name] on 06/07/2022 15:34:08 -------------------------------------------------------------------------------- Encounter Discharge Information Details Patient Name: Date of Service: Nathan [name], Nathan [name] 06/07/2022 1:00 PM Medical Record Number: [id number] Patient Account Number: [id number] Date of Birth/Sex: Treating RN: 07/06/1945 (76 y.o. Nathan [name] Primary Care Malike Foglio: Nathan [name] Other Clinician: Valeria [name] Referring Nathan [name]: Treating Nathan [name]/Extender: Nathan [name] in  Treatment: 12 Encounter Discharge Information Items Discharge Condition: Stable Ambulatory Status: Cane Discharge Destination: Home Transportation: Private Auto Accompanied By: driver Schedule Follow-up Appointment: No Clinical Summary of Care: Electronic Signature(s) Signed: 06/07/2022 3:53:51 PM By: Nathan [name] CHT EMT BS , , Entered By: Nathan [name] on 06/07/2022 15:53:51 Nathan [name] ([id number]) [id number] Page 2 of 2 -------------------------------------------------------------------------------- Vitals Details Patient Name: Date of Service: Nathan [name] [name] 06/07/2022 1:00 PM Medical Record Number: [id number] Patient Account Number: [id number] Date of Birth/Sex: Treating RN: 1946/02/15 (76 y.o. Nathan [name] Primary Care Nathan [name]: Nathan [name] Other Clinician: Valeria [name] Referring Nathan [name]: Treating Nathan [name]/Extender: Nathan [name] in Treatment: 12 Vital Signs Time Taken: 12:24 Temperature (F): 97.9 Weight (lbs): 183 Pulse (bpm): 72 Respiratory Rate (breaths/min): 18 Blood Pressure (mmHg): 175/99 Reference Range: 80 - 120 mg / dl Electronic Signature(s) Signed: 06/07/2022 3:34:31 PM By: Nathan [name] CHT EMT BS , , Entered By: Nathan [name] on 06/07/2022 15:34:31

## 2022-06-08 NOTE — Progress Notes (Signed)
TOREY, REINARD ([id number]) [id number] Page 1 of 1 Visit Report for 06/07/2022 SuperBill Details Patient Name: Date of Service: Nathan [name] [name] 06/07/2022 Medical Record Number: [id number] Patient Account Number: [id number] Date of Birth/Sex: Treating RN: [birth year] (76 y.o. 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

## 2022-06-09 NOTE — Progress Notes (Signed)
DECARLOS, EMPEY ([id number]) [id number] Page 1 of 2 Visit Report for 06/08/2022 Arrival Information Details Patient Name: Date of Service: Nathan [name] [name] 06/08/2022 1:00 PM Medical Record Number: [id number] Patient Account Number: [id number] Date of Birth/Sex: Treating RN: [birth year] (76 y.o. [name], [name] Primary Care Samael Blades: [name] Other Clinician: [name] Referring Fortino Haag: Treating Deyci Gesell/Extender: [name] in Treatment: 12 Visit Information History Since Last Visit All ordered tests and consults were completed: Yes Patient Arrived: Ambulatory Added or deleted any medications: No Arrival Time: 11:56 Any new allergies or adverse reactions: No Accompanied By: None Had a fall or experienced change in No Transfer Assistance: None activities of daily living that may affect Patient Identification Verified: Yes risk of falls: Secondary Verification Process Completed: Yes Signs or symptoms of abuse/neglect since last visito No Patient Requires Transmission-Based Precautions: No Hospitalized since last visit: No Patient Has Alerts: No Implantable device outside of the clinic excluding No cellular tissue based products placed in the center since last visit: Pain Present Now: No Electronic Signature(s) Signed: 06/08/2022 4:29:21 PM By: [name] EMT Entered By: [name] on 06/08/2022 16:29:21 -------------------------------------------------------------------------------- Encounter Discharge Information Details Patient Name: Date of Service: [name], [name] 06/08/2022 1:00 PM Medical Record Number: [id number] Patient Account Number: [id number] Date of Birth/Sex: Treating RN: 1946-01-19 (76 y.o. [name] Primary Care Sameer Teeple: [name] Other Clinician: [name] Referring Masashi Snowdon: Treating Makaylah Oddo/Extender: [name] in Treatment:  12 Encounter Discharge Information Items Discharge Condition: Stable Ambulatory Status: Ambulatory Discharge Destination: Home Transportation: Private Auto Accompanied By: None Schedule Follow-up Appointment: Yes Clinical Summary of Care: Electronic Signature(s) Signed: 06/08/2022 4:33:24 PM By: [name] EMT Entered By: [name] on 06/08/2022 16:33:24 [name] ([id number]) [id number] Page 2 of 2 -------------------------------------------------------------------------------- Vitals Details Patient Name: Date of Service: Nathan [name] [name] 06/08/2022 1:00 PM Medical Record Number: [id number] Patient Account Number: [id number] Date of Birth/Sex: Treating RN: 25-Dec-1945 (76 y.o. [name] Primary Care Ahni Bradwell: [name] Other Clinician: [name] Referring Kura Bethards: Treating Sydna Brodowski/Extender: [name] in Treatment: 12 Vital Signs Time Taken: 12:42 Temperature (F): 97.5 Weight (lbs): 183 Pulse (bpm): 71 Respiratory Rate (breaths/min): 16 Blood Pressure (mmHg): 155/79 Reference Range: 80 - 120 mg / dl Electronic Signature(s) Signed: 06/08/2022 4:29:51 PM By: [name] EMT Entered By: [name] on 06/08/2022 16:29:51

## 2022-06-09 NOTE — Progress Notes (Addendum)
Nathan, [name] ([id number]) [id number] Page 1 of 2 Visit Report for 06/08/2022 HBO Details Patient Name: Date of Service: Nathan [name] [name] 06/08/2022 1:00 PM Medical Record Number: [id number] Patient Account Number: [id number] Date of Birth/Sex: Treating RN: 1946-05-30 (76 y.o. 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 HBO Treatment 06/12/2022 End Date: HBO Discharge Treatment Series Complete; Non-Wound Protocol Outcome: Completed without Symptom Relief HBO Treatment Details Treatment Number: 58 Patient Type: Outpatient Chamber Type: Monoplace Chamber Serial #: [id number] Treatment Protocol: 2.0 ATA with 90 minutes oxygen, and no air breaks Treatment Details Compression Rate Down: 2.0 psi / minute De-Compression Rate Up: 2.0 psi / minute Air breaks and breathing Decompress Decompress Compress Tx Pressure Begins Reached periods Begins Ends (leave unused spaces blank) Chamber Pressure (ATA 1 2 ------2 1 ) Clock Time (24 hr) 12:48 12:56 - - - - - - 14:26 14:34 Treatment Length: 106 (minutes) Treatment Segments: 4 Vital Signs Capillary Blood Glucose Reference Range: 80 - 120 mg / dl HBO Diabetic Blood Glucose Intervention Range: <131 mg/dl or >249 mg/dl Time Vitals Blood Respiratory Capillary Blood Glucose Pulse Action Type: Pulse: Temperature: Taken: Pressure: Rate: Glucose (mg/dl): Meter #: Oximetry (%) Taken: Pre 12:42 155/79 71 16 97.5 Post 14:37 141/80 59 18 97.6 Treatment Response Treatment Toleration: Well Treatment Completion Status: Treatment Completed without Adverse  Event Physician HBO Attestation: I certify that I supervised this HBO treatment in accordance with Medicare guidelines. A trained emergency response team is readily available per Yes hospital policies and procedures. Continue HBOT as ordered. Yes Electronic Signature(s) Signed: 06/19/2022 7:58:03 AM By: Nathan Maudlin MD FACS Previous Signature: 06/19/2022 7:56:37 AM Version By: Nathan Maudlin MD FACS Previous Signature: 06/08/2022 4:32:31 PM Version By: Nathan [name] EMT Entered By: Nathan [name] on 06/19/2022 07:58:03 Nathan [name] ([id number]) [id number] Page 2 of 2 -------------------------------------------------------------------------------- HBO Safety Checklist Details Patient Name: Date of Service: Nathan [name] [name] 06/08/2022 1:00 PM Medical Record Number: [id number] Patient Account Number: [id number] Date of Birth/Sex: Treating RN: 23-Nov-1945 (76 y.o. Nathan [name], [name] Primary Care [name]: Nathan [name] Other Clinician: Valeria [name] Referring [name]: Treating [name]/Extender: Nathan [name] in Treatment: 12 HBO Safety Checklist Items Safety Checklist Consent Form Signed Patient voided / foley secured and emptied When did you last eato 0930 Last dose of injectable or oral agent NA Ostomy pouch emptied and vented if applicable NA All implantable devices assessed, documented and approved NA Intravenous access site secured and place NA Valuables secured Linens and cotton and cotton/polyester blend (less than 51% polyester) Personal oil-based products / skin lotions / body lotions removed Wigs or hairpieces removed NA Smoking or tobacco materials removed Books / newspapers / magazines / loose paper removed Cologne, aftershave, perfume and deodorant removed Jewelry removed (may wrap wedding band) Make-up removed NA Hair care products removed Battery operated devices (external) removed Heating patches  and chemical warmers removed Titanium eyewear removed NA Nail polish cured greater than 10 hours NA Casting material cured greater than 10 hours NA Hearing aids removed NA Loose dentures or partials removed removed by patient Prosthetics have been removed NA Patient demonstrates correct use of air break device (if applicable) Patient concerns have been addressed Patient grounding bracelet on  and cord attached to chamber Specifics for Inpatients (complete in addition to above) Medication sheet sent with patient NA Intravenous medications needed or due during therapy sent with patient NA Drainage tubes (e.g. nasogastric tube or chest tube secured and vented) NA Endotracheal or Tracheotomy tube secured NA Cuff deflated of air and inflated with saline NA Airway suctioned NA Notes The safety checklist was done before the treatment started. Electronic Signature(s) Signed: 06/08/2022 4:31:07 PM By: Nathan [name] EMT Entered By: Nathan [name] on 06/08/2022 16:31:07

## 2022-06-11 ENCOUNTER — Encounter (HOSPITAL_BASED_OUTPATIENT_CLINIC_OR_DEPARTMENT_OTHER): Payer: Medicare Other | Admitting: Internal Medicine

## 2022-06-11 DIAGNOSIS — N3041 Irradiation cystitis with hematuria: Secondary | ICD-10-CM | POA: Diagnosis not present

## 2022-06-11 NOTE — Progress Notes (Addendum)
Nathan [name] ([id number]) [id number] Page 1 of 2 Visit Report for 06/11/2022 Arrival Information Details Patient Name: Date of Service: Nathan [name] [name] 06/11/2022 1:00 PM Medical Record Number: [id number] Patient Account Number: [id number] Date of Birth/Sex: Treating RN: 02/16/1946 (76 y.o. [name], [name] Primary Care Doni Bacha: [name] Other Clinician: [name] Referring Taila Basinski: Treating Janele Lague/Extender: [name] in Treatment: 12 Visit Information History Since Last Visit All ordered tests and consults were completed: Yes Patient Arrived: [name] Added or deleted any medications: No Arrival Time: 12:23 Any new allergies or adverse reactions: No Accompanied By: None Had a fall or experienced change in No Transfer Assistance: None activities of daily living that may affect Patient Identification Verified: Yes risk of falls: Secondary Verification Process Completed: Yes Signs or symptoms of abuse/neglect since last visito No Patient Requires Transmission-Based Precautions: No Hospitalized since last visit: No Patient Has Alerts: No Implantable device outside of the clinic excluding No cellular tissue based products placed in the center since last visit: Pain Present Now: No Electronic Signature(s) Signed: 06/11/2022 4:13:37 PM By: [name] EMT Previous Signature: 06/11/2022 2:59:51 PM Version By: [name] EMT Entered By: [name] on 06/11/2022 16:13:37 -------------------------------------------------------------------------------- Encounter Discharge Information Details Patient Name: Date of Service: Nathan [name], [name] 06/11/2022 1:00 PM Medical Record Number: [id number] Patient Account Number: [id number] Date of Birth/Sex: Treating RN: 1945-11-19 (76 y.o. Nathan [name] Primary Care Jacaden Forbush: [name] Other Clinician: [name] Referring Leili Eskenazi: Treating  Ilyas Lipsitz/Extender: [name] in Treatment: 12 Encounter Discharge Information Items Discharge Condition: Stable Ambulatory Status: Cane Discharge Destination: Home Transportation: Private Auto Accompanied By: None Schedule Follow-up Appointment: Yes Clinical Summary of Care: Electronic Signature(s) Signed: 06/11/2022 4:15:05 PM By: [name] EMT Previous Signature: 06/11/2022 3:03:20 PM Version By: [name] EMT Entered By: [name] on 06/11/2022 16:15:04 [name] ([id number]) [id number] Page 2 of 2 -------------------------------------------------------------------------------- Vitals Details Patient Name: Date of Service: Nathan [name] [name] 06/11/2022 1:00 PM Medical Record Number: [id number] Patient Account Number: [id number] Date of Birth/Sex: Treating RN: [birth year] (76 y.o. Nathan [name] Primary Care Ekam Besson: [name] Other Clinician: [name] Referring Gerrald Basu: Treating Tolbert Matheson/Extender: [name] in Treatment: 12 Vital Signs Time Taken: 12:35 Temperature (F): 97.5 Weight (lbs): 183 Pulse (bpm): 86 Respiratory Rate (breaths/min): 16 Blood Pressure (mmHg): 141/91 Reference Range: 80 - 120 mg / dl Electronic Signature(s) Signed: 06/11/2022 4:13:47 PM By: [name] EMT Previous Signature: 06/11/2022 3:00:16 PM Version By: [name] EMT Entered By: [name] on 06/11/2022 16:13:47

## 2022-06-12 ENCOUNTER — Encounter (HOSPITAL_BASED_OUTPATIENT_CLINIC_OR_DEPARTMENT_OTHER): Payer: Medicare Other | Admitting: Internal Medicine

## 2022-06-12 DIAGNOSIS — Z923 Personal history of irradiation: Secondary | ICD-10-CM | POA: Diagnosis not present

## 2022-06-12 DIAGNOSIS — N3041 Irradiation cystitis with hematuria: Secondary | ICD-10-CM

## 2022-06-12 DIAGNOSIS — C61 Malignant neoplasm of prostate: Secondary | ICD-10-CM | POA: Diagnosis not present

## 2022-06-12 NOTE — Progress Notes (Addendum)
AURELIO, MCCAMY ([id number]) [id number] Page 1 of 2 Visit Report for 06/11/2022 HBO Details Patient Name: Date of Service: Nathan [name] [name] 06/11/2022 1:00 PM Medical Record Number: [id number] Patient Account Number: [id number] Date of Birth/Sex: Treating RN: 1945/12/14 (76 y.o. 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 HBO Treatment Details Treatment Number: 59 Patient Type: Outpatient Chamber Type: Monoplace Chamber Serial #: [id number] Treatment Protocol: 2.0 ATA with 90 minutes oxygen, and no air breaks Treatment Details Compression Rate Down: 2.0 psi / minute De-Compression Rate Up: 2.0 psi / minute Air breaks and breathing Decompress Decompress Compress Tx Pressure Begins Reached periods Begins Ends (leave unused spaces blank) Chamber Pressure (ATA 1 2 ------2 1 ) Clock Time (24 hr) 12:37 12:46 - - - - - - 14:17 14:24 Treatment Length: 107 (minutes) Treatment Segments: 4 Vital Signs Capillary Blood Glucose Reference Range: 80 - 120 mg / dl HBO Diabetic Blood Glucose Intervention Range: <131 mg/dl or >249 mg/dl Time Vitals Blood Respiratory Capillary Blood Glucose Pulse Action Type: Pulse: Temperature: Taken: Pressure: Rate: Glucose (mg/dl): Meter #: Oximetry (%) Taken: Pre 12:35 141/91 86 16 97.5 Post 14:26 123/95 68 16 97.5 Treatment Response Treatment Toleration: Well Treatment Completion Status: Treatment Completed without Adverse Event Airrion Otting Notes No concerns with treatment given Physician HBO Attestation: I certify that I supervised this HBO treatment in  accordance with Medicare guidelines. A trained emergency response team is readily available per Yes hospital policies and procedures. Continue HBOT as ordered. Yes Electronic Signature(s) Signed: 06/12/2022 3:46:52 PM By: Linton Ham MD Previous Signature: 06/11/2022 4:14:12 PM Version By: [name] EMT Previous Signature: 06/11/2022 5:01:06 PM Version By: Linton Ham MD Previous Signature: 06/11/2022 3:02:23 PM Version By: [name] EMT Entered By: Linton Ham on 06/12/2022 12:32:13 Nathan [name] ([id number]) [id number] Page 2 of 2 -------------------------------------------------------------------------------- HBO Safety Checklist Details Patient Name: Date of Service: Nathan [name] [name] 06/11/2022 1:00 PM Medical Record Number: [id number] Patient Account Number: [id number] Date of Birth/Sex: Treating RN: 13-May-1946 (76 y.o. Nathan [name], [name] Primary Care Marimar Suber: [name] Other Clinician: [name] Referring Marijo Quizon: Treating Shaunee Mulkern/Extender: [name] in Treatment: 12 HBO Safety Checklist Items Safety Checklist Consent Form Signed Patient voided / foley secured and emptied When did you last eato 0730 Last dose of injectable or oral agent NA Ostomy pouch emptied and vented if applicable NA All implantable devices assessed, documented and approved foley cath to leg bag Intravenous access site secured and place NA Valuables secured Linens and cotton and cotton/polyester blend (less than 51% polyester) Personal oil-based products / skin lotions / body lotions removed Wigs or hairpieces removed NA Smoking or tobacco materials removed Books / newspapers / magazines / loose paper removed Cologne, aftershave, perfume and deodorant removed Jewelry removed (may wrap wedding band) Make-up removed NA Hair care products removed Battery operated devices (external) removed Heating patches and  chemical warmers removed Titanium eyewear removed NA Nail polish cured greater than 10 hours NA Casting material cured greater than 10 hours NA Hearing aids removed NA Loose dentures or partials removed removed by patient Prosthetics have been removed NA Patient demonstrates correct use of air break device (if applicable) Patient concerns have been addressed Patient grounding  bracelet on and cord attached to chamber Specifics for Inpatients (complete in addition to above) Medication sheet sent with patient NA Intravenous medications needed or due during therapy sent with patient NA Drainage tubes (e.g. nasogastric tube or chest tube secured and vented) NA Endotracheal or Tracheotomy tube secured NA Cuff deflated of air and inflated with saline NA Airway suctioned NA Notes The safety checklist was done before the treatment started. Electronic Signature(s) Signed: 06/11/2022 4:13:58 PM By: [name] EMT Previous Signature: 06/11/2022 3:01:28 PM Version By: [name] EMT Entered By: [name] on 06/11/2022 16:13:58

## 2022-06-12 NOTE — Progress Notes (Signed)
DOVER, HEAD ([id number]) [id number] Page 1 of 2 Visit Report for 06/11/2022 Problem List Details Patient Name: Date of Service: [name] 06/11/2022 1:00 PM Medical Record Number: [id number] Patient Account Number: [id number] Date of Birth/Sex: Treating RN: [birth year] (76 y.o. 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 Malignant neoplasm of prostate 03/14/2022 No Yes I10 Essential (primary) hypertension 03/14/2022 No Yes I48.0 Paroxysmal atrial fibrillation 03/14/2022 No Yes Inactive Problems Resolved Problems Electronic Signature(s) Signed: 06/11/2022 4:14:35 PM By: [name] EMT Signed: 06/11/2022 5:01:06 PM By: Linton Ham MD Previous Signature: 06/11/2022 3:02:44 PM Version By: [name] EMT Entered By: [name] on 06/11/2022 16:14:35 -------------------------------------------------------------------------------- SuperBill Details Patient Name: Date of Service: [name], [name] 06/11/2022 Medical Record Number: [id number] Patient Account Number: [id number] Date of Birth/Sex: Treating RN: Nov 06, 1945 (76 y.o. [name] Primary Care Provider: [name] Other Clinician: [name] Referring Provider: Treating Provider/Extender: [name] in Treatment: [address] VINAL, ROSENGRANT ([id number]) [id number] Page 2 of 2 ICD-10 Codes Code Description 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 Personal history of irradiation C61 Malignant neoplasm of prostate I10 Essential (primary)  hypertension I48.0 Paroxysmal atrial fibrillation Facility Procedures : CPT4 Code Description: [id number] G0277-(Facility Use Only) HBOT full body chamber, 25mn , ICD-10 Diagnosis Description 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 Personal history of irradiation C61 Malignant neoplasm of prostate Modifier: Quantity: 4 Physician Procedures : CPT4 Code Description Modifier [id number] PHYS HYPERBARIC OXYGEN THERAPY ICD-10 Diagnosis Description 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 Personal history of irradiation C61 Malignant neoplasm of prostate Quantity: 1 Electronic Signature(s) Signed: 06/11/2022 4:14:24 PM By: GValeria BatmanEMT Signed: 06/11/2022 5:01:06 PM By: RLinton HamMD Previous Signature: 06/11/2022 3:02:38 PM Version By: GValeria BatmanEMT Entered By: GValeria Batmanon 06/11/2022 16:14:24

## 2022-06-12 NOTE — Progress Notes (Addendum)
Nathan [name], Nathan [name] ([id number]) [id number] Page 1 of 3 Visit Report for 06/12/2022 HBO Details Patient Name: Date of Service: Nathan [name] Nathan [name] 06/12/2022 1:00 PM Medical Record Number: [id number] Patient Account Number: [id number] Date of Birth/Sex: Treating RN: [birth year] (76 y.o. 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 HBO Treatment 06/12/2022 End Date: HBO Discharge Treatment Series Complete; Non-Wound Protocol Outcome: Completed without Symptom Relief HBO Treatment Details Treatment Number: 60 Patient Type: Outpatient Chamber Type: Monoplace Chamber Serial #: [id number] Treatment Protocol: 2.0 ATA with 90 minutes oxygen, and no air breaks Treatment Details Compression Rate Down: 2.0 psi / minute De-Compression Rate Up: 2.0 psi / minute Air breaks and breathing Decompress Decompress Compress Tx Pressure Begins Reached periods Begins Ends (leave unused spaces blank) Chamber Pressure (ATA 1 2 ------2 1 ) Clock Time (24 hr) 12:52 13:00 - - - - - - 14:30 14:38 Treatment Length: 106 (minutes) Treatment Segments: 4 Vital Signs Capillary Blood Glucose Reference Range: 80 - 120 mg / dl HBO Diabetic Blood Glucose Intervention Range: <131 mg/dl or >249 mg/dl Time Vitals Blood Respiratory Capillary Blood Glucose Pulse Action Type: Pulse: Temperature: Taken: Pressure: Rate: Glucose (mg/dl): Meter #: Oximetry (%) Taken: Pre 12:46 161/93 75 18 97.9 Post 14:39 144/76 59 18 97.5 Treatment Response Treatment Toleration: Well Treatment Completion Status: Treatment Completed without Adverse  Event Treatment Notes Today was patient's final planned HBO treatment. Patient stated that he still has hematuria and has not seen much change. MSCAMMELL Myliyah Rebuck Notes No concerns with treatment given. This was his last dive. Apparently not much improvement Physician HBO Attestation: I certify that I supervised this HBO treatment in accordance with Medicare guidelines. A trained emergency response team is readily available per Yes hospital policies and procedures. Continue HBOT as ordered. Yes Electronic Signature(s) Signed: 06/18/2022 7:47:41 AM By: Linton Ham MD Nathan [name], Signed: 06/18/2022 7:47:41 AM By: Linton Ham MD [place] ([id number]) [id number] Page 2 of 3 Previous Signature: 06/13/2022 5:24:01 PM Version By: Nathan [name] CHT EMT BS , , Previous Signature: 06/13/2022 5:23:30 PM Version By: Nathan [name] CHT EMT BS , , Previous Signature: 06/13/2022 5:23:19 PM Version By: Nathan [name] CHT EMT BS , , Previous Signature: 06/13/2022 2:43:57 PM Version By: Nathan [name] EMT Previous Signature: 06/13/2022 2:43:00 PM Version By: Nathan [name] EMT Previous Signature: 06/12/2022 4:34:47 PM Version By: Nathan Shan DO Previous Signature: 06/12/2022 3:01:04 PM Version By: Nathan [name] EMT Entered By: Linton Ham on 06/18/2022 07:45:03 -------------------------------------------------------------------------------- HBO Safety Checklist Details Patient Name: Date of Service: Nathan [name], [name] Nathan [name] 06/12/2022 1:00 PM Medical Record Number: [id number] Patient Account Number: [id number] Date of Birth/Sex: Treating RN: 14-Jun-1946 (76 y.o. Nathan [name] Session Primary Care Nathan [name]: Nathan [name] Other Clinician: Valeria [name] Referring Daniele Dillow: Treating Nathan [name]/Extender: Nathan [name] in Treatment: 12 HBO Safety Checklist Items Safety Checklist Consent Form Signed Patient voided / foley secured and  emptied When did you last eato 1000 Last dose of injectable or oral agent NA Ostomy pouch emptied and vented if applicable NA All implantable devices assessed, documented and approved foley cath to leg bag Intravenous access site secured and place NA Valuables secured Linens and cotton and cotton/polyester blend (less than 51% polyester) Personal oil-based products /  skin lotions / body lotions removed Wigs or hairpieces removed NA Smoking or tobacco materials removed Books / newspapers / magazines / loose paper removed Cologne, aftershave, perfume and deodorant removed Jewelry removed (may wrap wedding band) Make-up removed NA Hair care products removed Battery operated devices (external) removed Heating patches and chemical warmers removed Titanium eyewear removed Nail polish cured greater than 10 hours NA Casting material cured greater than 10 hours NA Hearing aids removed NA Loose dentures or partials removed removed by patient Prosthetics have been removed NA Patient demonstrates correct use of air break device (if applicable) Patient concerns have been addressed Patient grounding bracelet on and cord attached to chamber Specifics for Inpatients (complete in addition to above) Medication sheet sent with patient NA Intravenous medications needed or due during therapy sent with patient NA Drainage tubes (e.g. nasogastric tube or chest tube secured and vented) NA Endotracheal or Tracheotomy tube secured NA Cuff deflated of air and inflated with saline NA Airway suctioned NA Notes The safety checklist was done before the treatment started. Electronic Signature(s) Signed: 06/13/2022 2:42:44 PM By: Nathan [name] EMT Previous Signature: 06/12/2022 2:59:42 PM Version By: Nathan [name] EMT Entered By: Nathan [name] on 06/13/2022 14:42:43 Nathan [name] ([id number]) [id number] Page 3 of 3

## 2022-06-12 NOTE — Progress Notes (Signed)
Nathan, [name] ([id number]) [id number] Page 1 of 2 Visit Report for 06/12/2022 Arrival Information Details Patient Name: Date of Service: Nathan [name] [name] 06/12/2022 1:00 PM Medical Record Number: [id number] Patient Account Number: [id number] Date of Birth/Sex: Treating RN: May 16, 1946 (76 y.o. [name] Primary Care Amaiya Scruton: [name] Other Clinician: [name] Referring Wilmore Holsomback: Treating Tasheba Henson/Extender: [name] in Treatment: 12 Visit Information History Since Last Visit All ordered tests and consults were completed: Yes Patient Arrived: Nathan [name] Added or deleted any medications: No Arrival Time: 12:22 Any new allergies or adverse reactions: No Accompanied By: None Had a fall or experienced change in No Transfer Assistance: None activities of daily living that may affect Patient Identification Verified: Yes risk of falls: Secondary Verification Process Completed: Yes Signs or symptoms of abuse/neglect since last visito No Patient Requires Transmission-Based Precautions: No Hospitalized since last visit: No Patient Has Alerts: No Implantable device outside of the clinic excluding No cellular tissue based products placed in the center since last visit: Pain Present Now: No Electronic Signature(s) Signed: 06/13/2022 2:41:54 PM By: [name] EMT Previous Signature: 06/12/2022 2:57:40 PM Version By: [name] EMT Entered By: [name] on 06/13/2022 14:41:54 -------------------------------------------------------------------------------- Encounter Discharge Information Details Patient Name: Date of Service: [name], [name] 06/12/2022 1:00 PM Medical Record Number: [id number] Patient Account Number: [id number] Date of Birth/Sex: Treating RN: 1945-08-02 (76 y.o. [name] Primary Care Rishab Stoudt: [name] Other Clinician: [name] Referring Malikiah Debarr: Treating  Mihcael Ledee/Extender: [name] in Treatment: 12 Encounter Discharge Information Items Discharge Condition: Stable Ambulatory Status: Cane Discharge Destination: Home Transportation: Private Auto Accompanied By: None Schedule Follow-up Appointment: Yes Clinical Summary of Care: Electronic Signature(s) Signed: 06/13/2022 2:44:14 PM By: [name] EMT Previous Signature: 06/12/2022 3:01:59 PM Version By: [name] EMT Entered By: [name] on 06/13/2022 14:44:14 [name] ([id number]) [id number] Page 2 of 2 -------------------------------------------------------------------------------- Vitals Details Patient Name: Date of Service: Nathan [name] [name] 06/12/2022 1:00 PM Medical Record Number: [id number] Patient Account Number: [id number] Date of Birth/Sex: Treating RN: 06-01-1946 (76 y.o. [name] Primary Care Nathan [name]: [name] Other Clinician: [name] Referring Angelos [name]: Treating Jeanni Allshouse/Extender: [name] in Treatment: 12 Vital Signs Time Taken: 12:46 Temperature (F): 97.9 Weight (lbs): 183 Pulse (bpm): 75 Respiratory Rate (breaths/min): 18 Blood Pressure (mmHg): 161/93 Reference Range: 80 - 120 mg / dl Electronic Signature(s) Signed: 06/13/2022 2:42:03 PM By: [name] EMT Previous Signature: 06/12/2022 2:58:09 PM Version By: [name] EMT Entered By: [name] on 06/13/2022 14:42:03

## 2022-06-13 ENCOUNTER — Encounter (HOSPITAL_BASED_OUTPATIENT_CLINIC_OR_DEPARTMENT_OTHER): Payer: Medicare Other | Admitting: Internal Medicine

## 2022-06-13 NOTE — Progress Notes (Addendum)
JONAVON, TRIEU ([id number]) [id number] Page 1 of 2 Visit Report for 06/12/2022 Physician Orders Details Patient Name: Date of Service: Nathan [name] [name] 06/12/2022 1:00 PM Medical Record Number: [id number] Patient Account Number: [id number] Date of Birth/Sex: Treating RN: 06/13/1946 (76 y.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 Personal history of irradiation C61 Malignant neoplasm of prostate I10 Essential (primary) hypertension I48.0 Paroxysmal atrial fibrillation Electronic Signature(s) Signed: 06/13/2022 2:43:32 PM By: [name] EMT Signed: 06/18/2022 7:47:41 AM By: Linton Ham MD Entered By: [name] on 06/13/2022 14:43:32 -------------------------------------------------------------------------------- Problem List Details Patient Name: Date of Service: Nathan [name], [name] 06/12/2022 1:00 PM Medical Record Number: [id number] Patient Account Number: [id number] Date of Birth/Sex: Treating RN: 1945-08-06 (63 y.[name] Primary Care Provider: [name] Other Clinician: [name] Referring Provider: Treating Provider/Extender: [name] in Treatment: 12 Active Problems ICD-10 Encounter Code Description Active Date MDM Diagnosis N30.41 Irradiation cystitis with hematuria 03/14/2022 No Yes Z92.3 Personal history of irradiation 03/14/2022 No Yes C61 Malignant neoplasm of prostate 03/14/2022 No Yes I10 Essential (primary) hypertension 03/14/2022 No Yes [name], Nathan [name] ([id number]) [id number] Page 2 of 2 I48.0 Paroxysmal atrial fibrillation 03/14/2022 No Yes Inactive Problems Resolved  Problems Electronic Signature(s) Signed: 06/13/2022 2:43:22 PM By: [name] EMT Signed: 06/18/2022 7:47:41 AM By: Linton Ham MD Previous Signature: 06/12/2022 3:01:30 PM Version By: [name] EMT Previous Signature: 06/12/2022 4:34:47 PM Version By: Kalman Shan DO Entered By: [name] on 06/13/2022 14:43:22 -------------------------------------------------------------------------------- SuperBill Details Patient Name: Date of Service: [name], [name] 06/12/2022 Medical Record Number: [id number] Patient Account Number: [id number] Date of Birth/Sex: Treating RN: April 01, 1946 (37 y.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

## 2022-06-14 ENCOUNTER — Encounter (HOSPITAL_BASED_OUTPATIENT_CLINIC_OR_DEPARTMENT_OTHER): Payer: Medicare Other | Admitting: Internal Medicine

## 2022-06-15 ENCOUNTER — Encounter (HOSPITAL_BASED_OUTPATIENT_CLINIC_OR_DEPARTMENT_OTHER): Payer: Medicare Other | Admitting: Internal Medicine

## 2022-06-19 NOTE — Progress Notes (Signed)
AMIRR, ACHORD ([id number]) [id number] Page 1 of 2 Visit Report for 06/08/2022 Problem List Details Patient Name: Date of Service: Nathan [name] [name] 06/08/2022 1:00 PM Medical Record Number: [id number] Patient Account Number: [id number] Date of Birth/Sex: Treating RN: 02-Oct-1945 (76 y.o. [name], [name] Primary Care Provider: [name] Other Clinician: [name] Referring Provider: Treating Provider/Extender: [name] in Treatment: 12 Active Problems ICD-10 Encounter Code Description Active Date MDM Diagnosis N30.41 Irradiation cystitis with hematuria 03/14/2022 No Yes Z92.3 Personal history of irradiation 03/14/2022 No Yes C61 Malignant neoplasm of prostate 03/14/2022 No Yes I10 Essential (primary) hypertension 03/14/2022 No Yes I48.0 Paroxysmal atrial fibrillation 03/14/2022 No Yes Inactive Problems Resolved Problems Electronic Signature(s) Signed: 06/08/2022 4:32:55 PM By: [name] EMT Signed: 06/19/2022 11:23:07 AM By: [name] MD FACS Entered By: [name] on 06/08/2022 16:32:54 -------------------------------------------------------------------------------- SuperBill Details Patient Name: Date of Service: [name], [name] 06/08/2022 Medical Record Number: [id number] Patient Account Number: [id number] Date of Birth/Sex: Treating RN: [birth year] (76 y.o. 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 Personal history of irradiation C61 Malignant neoplasm of prostate I10 Essential (primary) hypertension I48.0 Paroxysmal atrial fibrillation Facility Procedures : CPT4  Code Description: [id number] G0277-(Facility Use Only) HBOT full body chamber, 52mn , ICD-10 Diagnosis Description 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 Personal history of irradiation C61 Malignant neoplasm of prostate Modifier: Quantity: 4 Physician Procedures : CPT4 Code Description Modifier [id number] 84166- WC PHYS HYPERBARIC OXYGEN THERAPY ICD-10 Diagnosis Description 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

## 2022-09-04 ENCOUNTER — Other Ambulatory Visit: Payer: Self-pay

## 2022-09-04 ENCOUNTER — Emergency Department (HOSPITAL_COMMUNITY)
Admission: EM | Admit: 2022-09-04 | Discharge: 2022-09-04 | Disposition: A | Discharge status: Home / Self Care | Payer: Medicare HMO | Attending: Emergency Medicine | Admitting: Emergency Medicine

## 2022-09-04 ENCOUNTER — Encounter (HOSPITAL_COMMUNITY): Payer: Self-pay | Admitting: Emergency Medicine

## 2022-09-04 ENCOUNTER — Emergency Department (HOSPITAL_BASED_OUTPATIENT_CLINIC_OR_DEPARTMENT_OTHER): Admit: 2022-09-04 | Discharge: 2022-09-04 | Disposition: A | Discharge status: Home / Self Care | Payer: Medicare HMO

## 2022-09-04 DIAGNOSIS — Z79899 Other long term (current) drug therapy: Secondary | ICD-10-CM | POA: Diagnosis not present

## 2022-09-04 DIAGNOSIS — I10 Essential (primary) hypertension: Secondary | ICD-10-CM | POA: Diagnosis not present

## 2022-09-04 DIAGNOSIS — I8011 Phlebitis and thrombophlebitis of right femoral vein: Secondary | ICD-10-CM | POA: Insufficient documentation

## 2022-09-04 DIAGNOSIS — I82421 Acute embolism and thrombosis of right iliac vein: Secondary | ICD-10-CM | POA: Diagnosis not present

## 2022-09-04 DIAGNOSIS — M7989 Other specified soft tissue disorders: Secondary | ICD-10-CM | POA: Diagnosis not present

## 2022-09-04 DIAGNOSIS — E119 Type 2 diabetes mellitus without complications: Secondary | ICD-10-CM | POA: Insufficient documentation

## 2022-09-04 DIAGNOSIS — I82411 Acute embolism and thrombosis of right femoral vein: Secondary | ICD-10-CM | POA: Diagnosis not present

## 2022-09-04 DIAGNOSIS — R2241 Localized swelling, mass and lump, right lower limb: Secondary | ICD-10-CM | POA: Diagnosis present

## 2022-09-04 LAB — CBC WITH DIFFERENTIAL/PLATELET
Abs Immature Granulocytes: 0.01 10*3/uL (ref 0.00–0.07)
Basophils Absolute: 0 10*3/uL (ref 0.0–0.1)
Basophils Relative: 1 %
Eosinophils Absolute: 0.1 10*3/uL (ref 0.0–0.5)
Eosinophils Relative: 3 %
HCT: 38.9 % — ABNORMAL LOW (ref 39.0–52.0)
Hemoglobin: 12.1 g/dL — ABNORMAL LOW (ref 13.0–17.0)
Immature Granulocytes: 0 %
Lymphocytes Relative: 22 %
Lymphs Abs: 0.9 10*3/uL (ref 0.7–4.0)
MCH: 28.1 pg (ref 26.0–34.0)
MCHC: 31.1 g/dL (ref 30.0–36.0)
MCV: 90.5 fL (ref 80.0–100.0)
Monocytes Absolute: 0.6 10*3/uL (ref 0.1–1.0)
Monocytes Relative: 14 %
Neutro Abs: 2.5 10*3/uL (ref 1.7–7.7)
Neutrophils Relative %: 60 %
Platelets: 211 10*3/uL (ref 150–400)
RBC: 4.3 MIL/uL (ref 4.22–5.81)
RDW: 15.3 % (ref 11.5–15.5)
WBC: 4.1 10*3/uL (ref 4.0–10.5)
nRBC: 0 % (ref 0.0–0.2)

## 2022-09-04 LAB — BASIC METABOLIC PANEL
Anion gap: 4 — ABNORMAL LOW (ref 5–15)
BUN: 13 mg/dL (ref 8–23)
CO2: 27 mmol/L (ref 22–32)
Calcium: 9 mg/dL (ref 8.9–10.3)
Chloride: 107 mmol/L (ref 98–111)
Creatinine, Ser: 1.04 mg/dL (ref 0.61–1.24)
GFR, Estimated: >60 mL/min (ref >60)
Glucose, Bld: 91 mg/dL (ref 70–99)
Potassium: 3.7 mmol/L (ref 3.5–5.1)
Sodium: 138 mmol/L (ref 135–145)

## 2022-09-04 NOTE — ED Notes (Signed)
US at bedside

## 2022-09-04 NOTE — Discharge Instructions (Signed)
Your ultrasound showed the same blood clots that in your right leg.  It would be important for you to follow-up with vascular for any further interventions.  I have attached a physician to these papers and you may call their office today or tomorrow morning for an appointment as soon as possible.  You may also discuss the veins in your hands with the vascular surgeon  I encourage you to follow-up with your primary care if you become any more concerned about your penile swelling.  My exam today looks good.

## 2022-09-04 NOTE — ED Provider Notes (Signed)
[facility] Provider Note   CSN: [id number] Arrival date & time: 09/04/22  1319     History  Chief Complaint  Patient presents with   Leg Swelling   Groin Swelling    [name] is a 77 y.o. male with a past medical history of, type 2 diabetes, hypertension and DVT diagnosed 12/2021 presenting today with increased swelling in the right leg.  Has a known DVT in the area however he is unable to be anticoagulated secondary to hematuria from his suprapubic catheter.  Has a suprapubic catheter for prostate cancer.  Also said that this morning he noticed some swelling to his penis.  No pain to his leg or his penis.  No penile drainage or dysuria.  Still having output in his catheter bag.  HPI     Home Medications Prior to Admission medications   Medication Sig Start Date End Date Taking? Authorizing Provider  acetaminophen (TYLENOL) 500 MG tablet Take 1,000 mg by mouth daily as needed (pain).    [provider]  acetic acid 0.25 % irrigation Apply 1 Application topically every other day. Intravesical use 02/02/22   [provider]  ergocalciferol (VITAMIN D2) 1.25 MG (50000 UT) capsule Take 50,000 Units by mouth once a week.    [provider]  ferrous sulfate 325 (65 FE) MG tablet Take 325 mg by mouth daily with breakfast.    [provider]  finasteride (PROSCAR) 5 MG tablet Take 5 mg by mouth daily.    [provider]  folic acid (FOLVITE) 1 MG tablet Take 1 mg by mouth daily. 03/24/19   [provider]  gabapentin (NEURONTIN) 300 MG capsule Take 300 mg by mouth 3 (three) times daily as needed (nerve pain). 10/27/21   [provider]  hyoscyamine (LEVSIN SL) 0.125 MG SL tablet Place 1 tablet (0.125 mg total) under the tongue every 6 (six) hours as needed (bladder spasms). 03/03/22   Bonnielee Haff, MD  lisinopril (ZESTRIL) 40 MG tablet Take 40 mg by mouth daily.    [provider]  magnesium oxide (MAG-OX) 400 MG tablet Take 400 mg by mouth daily.  03/24/19   [provider]  metoprolol succinate (TOPROL-XL) 100 MG 24 hr tablet Take 100 mg by mouth daily. 11/14/21   [provider]  Omega-3 Fatty Acids (FISH OIL) 1000 MG CAPS Take 1,000 mg by mouth daily.    [provider]  oxycodone (OXY-IR) 5 MG capsule Take 1 capsule (5 mg total) by mouth every 6 (six) hours as needed for pain. 03/03/22   Bonnielee Haff, MD  Prenatal Vit-Fe Fumarate-FA (PRENATAL MULTIVITAMIN) TABS tablet Take 1 tablet by mouth daily at 12 noon.    [provider]  Sodium Chloride Flush (SALINE FLUSH IV) Inject into the vein daily.    [provider]  vitamin B-12 (CYANOCOBALAMIN) 1000 MCG tablet Take 1,000 mcg by mouth daily.    [provider]      Allergies    Patient has no known allergies.    Review of Systems   Review of Systems  Physical Exam Updated Vital Signs BP (!) 163/83 (BP Location: Right Arm)   Pulse 75   Temp 97.6 F (36.4 C) (Oral)   Resp 16   Ht '6\' 1"'$  (1.854 m)   Wt 84.4 kg   SpO2 99%   BMI 24.54 kg/m  Physical Exam Vitals and nursing note reviewed.  Constitutional:  General: He is not in acute distress.    Appearance: Normal appearance. He is not ill-appearing.  HENT:     Head: Normocephalic and atraumatic.  Eyes:     General: No scleral icterus.    Conjunctiva/sclera: Conjunctivae normal.  Pulmonary:     Effort: Pulmonary effort is normal. No respiratory distress.  Genitourinary:    Comments: GU exam performed in presence of patient's daughter.  Patient is uncircumcised however no phimosis/paraphimosis.  No penile drainage.  No lesions or ulcerations.   Skin:    General: Skin is warm and dry.     Findings: No rash.     Comments: Swelling and tension to the right lower extremity to the level of the mid thigh.  DP pulse intact.  Full range of motion of the ankle and MTPs.  No appreciable  swelling in the left lower extremity.  Neurological:     Mental Status: He is alert.  Psychiatric:        Mood and Affect: Mood normal.     ED Results / Procedures / Treatments   Labs (all labs ordered are listed, but only abnormal results are displayed) Labs Reviewed - No data to display  EKG None  Radiology VAS [place] LOWER EXTREMITY VENOUS (DVT) (7a-7p)  Result Date: 09/04/2022  Lower Venous DVT Study Patient Name:  Nathan [name]  Date of Exam:   09/04/2022 Medical Rec #: [id number]       Accession #:    [id number] Date of Birth: 09/04/1945        Patient Gender: M Patient Age:   8 years Exam Location:  [facility] Procedure:      VAS [place] LOWER EXTREMITY VENOUS (DVT) Referring Phys: Emmerson Shuffield --------------------------------------------------------------------------------  Indications: Swelling.  Risk Factors: DVT. Anticoagulation: IVC filter. Limitations: Poor ultrasound/tissue interface. Comparison Study: 12/09/2021 - RIGHT:                   - No evidence of common femoral vein obstruction.                    LEFT:                   - Findings consistent with acute deep vein thrombosis                   involving the left                   femoral vein, left popliteal vein, left posterior tibial                   veins, left                   peroneal veins, and left gastrocnemius veins.                   - Findings consistent with acute superficial vein thrombosis                   involving the                   left small saphenous vein.                   - No cystic structure found in the popliteal fossa.                    01/28/2022 - IVC/Iliac: There is evidence  of acute thrombus                   involving the IVC. There is                   no evidence of thrombus involving the left common iliac vein.                   There is                   evidence of acute thrombus involving the right common iliac                   vein. There is                   no evidence of thrombus  involving                   the left external iliac vein. There is evidence of acute                   thrombus                   involving the right external iliac vein.                    01/28/2022 - RIGHT:                   - Findings consistent with acute deep vein thrombosis                   involving the right                   common femoral vein, SF junction, right femoral vein, right                   proximal                   profunda vein, right popliteal vein, right posterior tibial                   veins, right                   peroneal veins, and right                   gastrocnemius veins.                   - No cystic structure found in the popliteal fossa.                   - Possible obstruction proximal to the inguinal ligament.                    LEFT:                   - Findings consistent with acute deep vein thrombosis                   involving the left                   femoral vein, left popliteal vein, left posterior tibial                   veins, left  peroneal veins, and left gastrocnemius veins.                   - No cystic structure found in the popliteal fossa.                   - Continuous flow noted in the left lower extremity. Performing Technologist: Oliver Hum RVT  Examination Guidelines: A complete evaluation includes B-mode imaging, spectral Doppler, color Doppler, and power Doppler as needed of all accessible portions of each vessel. Bilateral testing is considered an integral part of a complete examination. Limited examinations for reoccurring indications may be performed as noted. The reflux portion of the exam is performed with the patient in reverse Trendelenburg.  +---------+---------------+---------+-----------+----------+-----------------+ RIGHT    CompressibilityPhasicitySpontaneityPropertiesThrombus Aging    +---------+---------------+---------+-----------+----------+-----------------+ CFV      None           No       No                    Age Indeterminate +---------+---------------+---------+-----------+----------+-----------------+ SFJ      Full                                                           +---------+---------------+---------+-----------+----------+-----------------+ FV Prox  Partial        Yes      No                   Age Indeterminate +---------+---------------+---------+-----------+----------+-----------------+ FV Mid   Partial        Yes      No                   Age Indeterminate +---------+---------------+---------+-----------+----------+-----------------+ FV DistalPartial        Yes      No                   Age Indeterminate +---------+---------------+---------+-----------+----------+-----------------+ PFV      Full                                                           +---------+---------------+---------+-----------+----------+-----------------+ POP      Full           Yes      No                                     +---------+---------------+---------+-----------+----------+-----------------+ PTV      Full                                                           +---------+---------------+---------+-----------+----------+-----------------+ PERO     Full                                                           +---------+---------------+---------+-----------+----------+-----------------+  Gastroc  Full                                                           +---------+---------------+---------+-----------+----------+-----------------+ CIV                     Yes      No                                     +---------+---------------+---------+-----------+----------+-----------------+ EIV                     No       No                   Age Indeterminate +---------+---------------+---------+-----------+----------+-----------------+   +----+---------------+---------+-----------+----------+--------------+  LEFTCompressibilityPhasicitySpontaneityPropertiesThrombus Aging +----+---------------+---------+-----------+----------+--------------+ CFV Full           Yes      Yes                                 +----+---------------+---------+-----------+----------+--------------+    Summary: RIGHT: - Findings consistent with age indeterminate deep vein thrombosis involving the right external iliac vein, right common femoral vein, and right femoral vein. - No cystic structure found in the popliteal fossa.  LEFT: - No evidence of common femoral vein obstruction.  *See table(s) above for measurements and observations.    Preliminary     Procedures Procedures   Medications Ordered in ED Medications - No data to display  ED Course/ Medical Decision Making/ A&P                             Medical Decision Making Amount and/or Complexity of Data Reviewed Labs: ordered.   77 year old presenting with right leg swelling.  Differential includes but is not limited to cellulitis, DVT, CHF.  This is not an exhaustive differential.    Past Medical History / Co-morbidities / Social History: Suprapubic catheter for prostate cancer, DVT of the right lower extremity that cannot be anticoagulated, IVC filter in place.   Additional history: Per chart review patient had an IVC filter placed in July 2023 due to his inability to be anticoagulated for his DVT secondary to hematuria.  DVT study diagnosed in July 2023 showed:   Findings consistent with acute deep vein thrombosis involving the right  common femoral vein, SF junction, right femoral vein, right proximal  profunda vein, right popliteal vein, right posterior tibial veins, right  peroneal veins, and right  gastrocnemius veins.    Physical Exam: Pertinent physical exam findings include Swelling of the right lower extremity.  Normal penile exam  Lab Tests: I ordered, and personally interpreted labs.  The pertinent results include: No acute  findings   Imaging Studies: I ordered patient's DVT study and spoke with the performing technician.  Patient has some total occlusions however overall continues to have blood flow distally and proximally.  No emergent findings.   MDM/Disposition: This is a 77 year old male who presented today due to swelling of the right leg that is becoming uncomfortable.  Has a known DVT and cannot be anticoagulated.  Vascular  study today is similar to what it was in summer 2023.  No acute findings.  Patient at this point will need to follow-up outpatient with vascular surgery.  I discussed this with the patient and his daughter at bedside and they are agreeable.  Lab work also looks stable.  My exam of his penis was normal, no signs of paraphimosis/phimosis, ulcerations or any concerns.  Normal testicular exam.  Believe he can follow-up with either urology or PCP if he continues to have concerns of swelling.  Discharged in stable condition     I discussed this case with my attending physician Dr. Gilford Raid who cosigned this note including patient's presenting symptoms, physical exam, and planned diagnostics and interventions. Attending physician stated agreement with plan or made changes to plan which were implemented.      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 Return precautions discussed in full. Patient had no additional questions and expressed complete understanding.   This chart was dictated using voice recognition software.  Despite best efforts to proofread,  errors can occur which can change the documentation meaning.     Rhae Hammock, PA-C 09/04/22 1458    Isla Pence, MD 09/04/22 1525

## 2022-09-04 NOTE — Progress Notes (Signed)
Right lower extremity venous duplex has been completed. Preliminary results can be found in CV Proc through chart review.  Results were given to [place] PA.  09/04/22 2:44 PM Nathan [name] RVT

## 2022-09-04 NOTE — ED Triage Notes (Signed)
Patient arrives ambulatory with cane by POV c/o right leg swelling and groin swelling onset of this morning. C/o tightness to right leg. Patient reports known blood clot in leg however not on blood thinners due to suprapubic catheter issues with bleeding.

## 2022-09-05 ENCOUNTER — Ambulatory Visit (INDEPENDENT_AMBULATORY_CARE_PROVIDER_SITE_OTHER): Payer: Medicare HMO | Admitting: Vascular Surgery

## 2022-09-05 ENCOUNTER — Encounter: Payer: Self-pay | Admitting: Vascular Surgery

## 2022-09-05 VITALS — BP 150/82 | HR 64 | Temp 97.9°F | Resp 20 | Ht 73.0 in | Wt 206.9 lb

## 2022-09-05 DIAGNOSIS — I87001 Postthrombotic syndrome without complications of right lower extremity: Secondary | ICD-10-CM | POA: Diagnosis not present

## 2022-09-05 NOTE — Progress Notes (Signed)
Patient ID: Nathan [name], male   DOB: Nov 16, 1945, 77 y.o.   MRN: [id number]  Reason for Consult: New Patient (Initial Visit)   Referred by [name], MD  Subjective:     HPI:  Nathan [name] is a 77 y.o. male who I have previously placed an IVC filter in for extensive left lower extremity DVT with hematuria.  He also has a history of prostate cancer.  Currently during his hospitalization he also had a DVT in the right lower extremity.  He was then placed on anticoagulation with his filter in place but this was stopped in February.  He has significant swelling in the right lower extremity.  He continues to walk with the help of a cane.  He says his leg feels heavy all the time.  He denies any left lower extremity swelling.  Did not have DVT before any of his recent hospitalizations.  Past Medical History:  Diagnosis Date   Anginal pain ([place])    Atrial fibrillation ([place]) 01/2020   resolved now    Cancer of prostate ([place]) 2020   Chronic anticoagulation    Diabetes mellitus without complication ([place])    type 2 diet controlled   Dysrhythmia    a fib   Foley catheter in place    will be changed 05-16-20   HTN (hypertension)    Lipoma of chest wall    Pneumonia 01/2020   and uti in hospital for 1 week   WPW (Wolff-Parkinson-White syndrome)    No prior ablation   Family History  Problem Relation Age of Onset   Lung cancer Mother    Cervical cancer Sister    Breast cancer Neg Hx    Pancreatic cancer Neg Hx    Colon cancer Neg Hx    Past Surgical History:  Procedure Laterality Date   CATARACT EXTRACTION Bilateral 2020   CYSTOSCOPY N/A 06/05/2021   Procedure: CYSTOSCOPY WITH SUPRA PUBIC TUBE PLACEMENT;  Surgeon: Raynelle Bring, MD;  Location: [facility];  Service: Urology;  Laterality: N/A;   CYSTOSCOPY WITH FULGERATION N/A 01/27/2022   Procedure: [place] WITH CLOT EVACUATION;  Surgeon: Ardis Hughs, MD;  Location: [facility];  Service: Urology;   Laterality: N/A;   CYSTOSCOPY WITH FULGERATION N/A 03/02/2022   Procedure: CYSTOSCOPY, CLOT EVACUTION WITH TRANSURETHERAL FULGERATION OF BLADDER;  Surgeon: Raynelle Bring, MD;  [facility];  Service: Urology;  Laterality: N/A;   IR 3D INDEPENDENT WKST  01/17/2022   IR ANGIOGRAM PELVIS SELECTIVE OR SUPRASELECTIVE  01/17/2022   IR ANGIOGRAM SELECTIVE EACH ADDITIONAL VESSEL  01/17/2022   IR ANGIOGRAM SELECTIVE EACH ADDITIONAL VESSEL  01/17/2022   IR EMBO ART  VEN HEMORR LYMPH EXTRAV  INC GUIDE ROADMAPPING  01/17/2022   IR US GUIDE VASC ACCESS RIGHT  01/17/2022   LIPOMA EXCISION Left 05/16/2020   Procedure: EXCISION LIPOMA LEFT CHEST WALL;  Surgeon: Kieth Brightly Arta Bruce, MD;  Location: [facility];  Service: General;  Laterality: Left;   RADIOACTIVE SEED IMPLANT  2020   TRANSURETHRAL RESECTION OF BLADDER TUMOR N/A 01/17/2022   Procedure: CYSTOSCOPY WITH CLOT EVACUATION, FULGERATION OF PROSTATE;  Surgeon: Festus Aloe, MD;  [facility];  Service: Urology;  Laterality: N/A;   VENA CAVA FILTER PLACEMENT Right 12/10/2021   Procedure: INSERTION VENA-CAVA FILTER Right Femoral;  Surgeon: Waynetta Sandy, MD;  Location: [facility];  Service: Vascular;  Laterality: Right;    Short Social History:  Social History   Tobacco Use  Smoking status: Former    Packs/day: 0.50    Years: 60.00    Total pack years: 30.00    Types: Cigarettes    Quit date: 01/31/2019    Years since quitting: 3.5   Smokeless tobacco: Never  Substance Use Topics   Alcohol use: Not Currently    Comment: no alochol since 2020    No Known Allergies  Current Outpatient Medications  Medication Sig Dispense Refill   acetaminophen (TYLENOL) 500 MG tablet Take 1,000 mg by mouth daily as needed (pain).     acetic acid 0.25 % irrigation Apply 1 Application topically every other day. Intravesical use     ergocalciferol (VITAMIN D2) 1.25 MG (50000 UT) capsule Take 50,000 Units by mouth once a week.      ferrous sulfate 325 (65 FE) MG tablet Take 325 mg by mouth daily with breakfast.     finasteride (PROSCAR) 5 MG tablet Take 5 mg by mouth daily.     folic acid (FOLVITE) 1 MG tablet Take 1 mg by mouth daily.     gabapentin (NEURONTIN) 300 MG capsule Take 300 mg by mouth 3 (three) times daily as needed (nerve pain).     hyoscyamine (LEVSIN SL) 0.125 MG SL tablet Place 1 tablet (0.125 mg total) under the tongue every 6 (six) hours as needed (bladder spasms). 30 tablet 0   lisinopril (ZESTRIL) 40 MG tablet Take 40 mg by mouth daily.     magnesium oxide (MAG-OX) 400 MG tablet Take 400 mg by mouth daily.      metoprolol succinate (TOPROL-XL) 100 MG 24 hr tablet Take 100 mg by mouth daily.     Omega-3 Fatty Acids (FISH OIL) 1000 MG CAPS Take 1,000 mg by mouth daily.     Prenatal Vit-Fe Fumarate-FA (PRENATAL MULTIVITAMIN) TABS tablet Take 1 tablet by mouth daily at 12 noon.     Sodium Chloride Flush (SALINE FLUSH IV) Inject into the vein daily.     vitamin B-12 (CYANOCOBALAMIN) 1000 MCG tablet Take 1,000 mcg by mouth daily.     oxycodone (OXY-IR) 5 MG capsule Take 1 capsule (5 mg total) by mouth every 6 (six) hours as needed for pain. (Patient not taking: Reported on 09/05/2022) 20 capsule 0   No current facility-administered medications for this visit.    Review of Systems  Constitutional:  Constitutional negative. HENT: HENT negative.  Eyes: Eyes negative.  Respiratory: Respiratory negative.  Cardiovascular: Positive for leg swelling.  GI: Gastrointestinal negative.  Musculoskeletal: Positive for leg pain.  Skin: Skin negative.  Neurological: Neurological negative. Hematologic: Hematologic/lymphatic negative.  Psychiatric: Psychiatric negative.        Objective:  Objective   Vitals:   09/05/22 1434  BP: (!) 150/82  Pulse: 64  Resp: 20  Temp: 97.9 F (36.6 C)  SpO2: 98%  Weight: 206 lb 14.4 oz (93.8 kg)  Height: '6\' 1"'$  (1.854 m)   Body mass index is 27.3 kg/m.  Physical  Exam HENT:     Head: Normocephalic.     Nose: Nose normal.  Eyes:     Pupils: Pupils are equal, round, and reactive to light.  Pulmonary:     Effort: Pulmonary effort is normal.  Musculoskeletal:     Cervical back: Normal range of motion.     Right lower leg: Edema present.     Left lower leg: No edema.     Comments: Right leg is very tight measures 43 cm relative to the left side which is 38  cm (both measured 10 cm from tibial tuberosity)  Skin:    General: Skin is warm.  Neurological:     Mental Status: He is alert.  Psychiatric:        Mood and Affect: Mood normal.        Thought Content: Thought content normal.     Data: RIGHT    CompressibilityPhasicitySpontaneityPropertiesThrombus Aging      +---------+---------------+---------+-----------+----------+---------------  --+  CFV     None           No       No                   Age  Indeterminate  +---------+---------------+---------+-----------+----------+---------------  --+  SFJ     Full                                                             +---------+---------------+---------+-----------+----------+---------------  --+  FV Prox  Partial        Yes      No                   Age  Indeterminate  +---------+---------------+---------+-----------+----------+---------------  --+  FV Mid   Partial        Yes      No                   Age  Indeterminate  +---------+---------------+---------+-----------+----------+---------------  --+  FV DistalPartial        Yes      No                   Age  Indeterminate  +---------+---------------+---------+-----------+----------+---------------  --+  PFV     Full                                                             +---------+---------------+---------+-----------+----------+---------------  --+  POP     Full           Yes      No                                        +---------+---------------+---------+-----------+----------+---------------  --+  PTV     Full                                                             +---------+---------------+---------+-----------+----------+---------------  --+  PERO    Full                                                             +---------+---------------+---------+-----------+----------+---------------  --+  Gastroc Full                                                             +---------+---------------+---------+-----------+----------+---------------  --+  CIV                    Yes      No                                       +---------+---------------+---------+-----------+----------+---------------  --+  EIV                    No       No                   Age  Indeterminate  +---------+---------------+---------+-----------+----------+---------------      +----+---------------+---------+-----------+----------+--------------+  LEFTCompressibilityPhasicitySpontaneityPropertiesThrombus Aging  +----+---------------+---------+-----------+----------+--------------+  CFV Full           Yes      Yes                                  +----+---------------+---------+-----------+----------+--------------+      Summary:  RIGHT:  - Findings consistent with age indeterminate deep vein thrombosis  involving the right external iliac vein, right common femoral vein, and  right femoral vein.  - No cystic structure found in the popliteal fossa.    LEFT:  - No evidence of common femoral vein obstruction.         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  This is consistent with chronic DVT given his history of extensive right lower extremity DVT  extending back to July.  Currently not taking any anticoagulants.  Given the significant symptoms of post thrombotic syndrome I have recommended venography from a prone popliteal or small saphenous vein approach.  If his femoral veins are completely occluded would need common femoral vein approach to rule out any central venous processes.  I discussed with the patient and his daughter that even with intervention his leg is unlikely to return to normal but that potentially we can get it down to approximately 5% larger than the left currently is more than 10% larger.  We discussed the risk and benefits of the procedure possible need for anticoagulation in the future given that he has not had any further bleeding issues.  All of their questions were answered and we will get him scheduled for Monday in the near future.     Waynetta Sandy MD [facility]

## 2022-09-05 NOTE — H&P (View-Only) (Signed)
 Patient ID: Nathan [name], male   DOB: 11/03/1945, 76 y.o.   MRN: [id number]  Reason for Consult: New Patient (Initial Visit)   Referred by Richter, Karen L, MD  Subjective:     HPI:  Nathan [name] is a 76 y.o. male who I have previously placed an IVC filter in for extensive left lower extremity DVT with hematuria.  He also has a history of prostate cancer.  Currently during his hospitalization he also had a DVT in the right lower extremity.  He was then placed on anticoagulation with his filter in place but this was stopped in February.  He has significant swelling in the right lower extremity.  He continues to walk with the help of a cane.  He says his leg feels heavy all the time.  He denies any left lower extremity swelling.  Did not have DVT before any of his recent hospitalizations.  Past Medical History:  Diagnosis Date   Anginal pain ([facility])    Atrial fibrillation ([facility]) 01/2020   resolved now    Cancer of prostate ([facility]) 2020   Chronic anticoagulation    Diabetes mellitus without complication ([facility])    type 2 diet controlled   Dysrhythmia    a fib   Foley catheter in place    will be changed 05-16-20   HTN (hypertension)    Lipoma of chest wall    Pneumonia 01/2020   and uti in hospital for 1 week   WPW (Wolff-Parkinson-White syndrome)    No prior ablation   Family History  Problem Relation Age of Onset   Lung cancer Mother    Cervical cancer Sister    Breast cancer Neg Hx    Pancreatic cancer Neg Hx    Colon cancer Neg Hx    Past Surgical History:  Procedure Laterality Date   CATARACT EXTRACTION Bilateral 2020   CYSTOSCOPY N/A 06/05/2021   Procedure: CYSTOSCOPY WITH SUPRA PUBIC TUBE PLACEMENT;  Surgeon: Borden, Lester, MD;  [facility];  Service: Urology;  Laterality: N/A;   CYSTOSCOPY WITH FULGERATION N/A 01/27/2022   Procedure: CYSTOSCOPY WITH FULGERATION WITH CLOT EVACUATION;  Surgeon: Herrick, Benjamin W, MD;  [facility];  Service: Urology;   Laterality: N/A;   CYSTOSCOPY WITH FULGERATION N/A 03/02/2022   Procedure: CYSTOSCOPY, CLOT EVACUTION WITH TRANSURETHERAL FULGERATION OF BLADDER;  Surgeon: Borden, Lester, MD;  [facility];  Service: Urology;  Laterality: N/A;   IR 3D INDEPENDENT WKST  01/17/2022   IR ANGIOGRAM PELVIS SELECTIVE OR SUPRASELECTIVE  01/17/2022   IR ANGIOGRAM SELECTIVE EACH ADDITIONAL VESSEL  01/17/2022   IR ANGIOGRAM SELECTIVE EACH ADDITIONAL VESSEL  01/17/2022   IR EMBO ART  VEN HEMORR LYMPH EXTRAV  INC GUIDE ROADMAPPING  01/17/2022   IR US GUIDE VASC ACCESS RIGHT  01/17/2022   LIPOMA EXCISION Left 05/16/2020   Procedure: EXCISION LIPOMA LEFT CHEST WALL;  Surgeon: Kinsinger, Luke Aaron, MD;  Location: [facility];  Service: General;  Laterality: Left;   RADIOACTIVE SEED IMPLANT  2020   TRANSURETHRAL RESECTION OF BLADDER TUMOR N/A 01/17/2022   Procedure: CYSTOSCOPY WITH CLOT EVACUATION, FULGERATION OF PROSTATE;  Surgeon: Eskridge, Matthew, MD;  [facility];  Service: Urology;  Laterality: N/A;   VENA CAVA FILTER PLACEMENT Right 12/10/2021   Procedure: INSERTION VENA-CAVA FILTER Right Femoral;  Surgeon: Quatavious Rossa Christopher, MD;  Location[facility];  Service: Vascular;  Laterality: Right;    Short Social History:  Social History   Tobacco Use     Smoking status: Former    Packs/day: 0.50    Years: 60.00    Total pack years: 30.00    Types: Cigarettes    Quit date: 01/31/2019    Years since quitting: 3.5   Smokeless tobacco: Never  Substance Use Topics   Alcohol use: Not Currently    Comment: no alochol since 2020    No Known Allergies  Current Outpatient Medications  Medication Sig Dispense Refill   acetaminophen (TYLENOL) 500 MG tablet Take 1,000 mg by mouth daily as needed (pain).     acetic acid 0.25 % irrigation Apply 1 Application topically every other day. Intravesical use     ergocalciferol (VITAMIN D2) 1.25 MG (50000 UT) capsule Take 50,000 Units by mouth once a week.      ferrous sulfate 325 (65 FE) MG tablet Take 325 mg by mouth daily with breakfast.     finasteride (PROSCAR) 5 MG tablet Take 5 mg by mouth daily.     folic acid (FOLVITE) 1 MG tablet Take 1 mg by mouth daily.     gabapentin (NEURONTIN) 300 MG capsule Take 300 mg by mouth 3 (three) times daily as needed (nerve pain).     hyoscyamine (LEVSIN SL) 0.125 MG SL tablet Place 1 tablet (0.125 mg total) under the tongue every 6 (six) hours as needed (bladder spasms). 30 tablet 0   lisinopril (ZESTRIL) 40 MG tablet Take 40 mg by mouth daily.     magnesium oxide (MAG-OX) 400 MG tablet Take 400 mg by mouth daily.      metoprolol succinate (TOPROL-XL) 100 MG 24 hr tablet Take 100 mg by mouth daily.     Omega-3 Fatty Acids (FISH OIL) 1000 MG CAPS Take 1,000 mg by mouth daily.     Prenatal Vit-Fe Fumarate-FA (PRENATAL MULTIVITAMIN) TABS tablet Take 1 tablet by mouth daily at 12 noon.     Sodium Chloride Flush (SALINE FLUSH IV) Inject into the vein daily.     vitamin B-12 (CYANOCOBALAMIN) 1000 MCG tablet Take 1,000 mcg by mouth daily.     oxycodone (OXY-IR) 5 MG capsule Take 1 capsule (5 mg total) by mouth every 6 (six) hours as needed for pain. (Patient not taking: Reported on 09/05/2022) 20 capsule 0   No current facility-administered medications for this visit.    Review of Systems  Constitutional:  Constitutional negative. HENT: HENT negative.  Eyes: Eyes negative.  Respiratory: Respiratory negative.  Cardiovascular: Positive for leg swelling.  GI: Gastrointestinal negative.  Musculoskeletal: Positive for leg pain.  Skin: Skin negative.  Neurological: Neurological negative. Hematologic: Hematologic/lymphatic negative.  Psychiatric: Psychiatric negative.        Objective:  Objective   Vitals:   09/05/22 1434  BP: (!) 150/82  Pulse: 64  Resp: 20  Temp: 97.9 F (36.6 C)  SpO2: 98%  Weight: 206 lb 14.4 oz (93.8 kg)  Height: 6' 1" (1.854 m)   Body mass index is 27.3 kg/m.  Physical  Exam HENT:     Head: Normocephalic.     Nose: Nose normal.  Eyes:     Pupils: Pupils are equal, round, and reactive to light.  Pulmonary:     Effort: Pulmonary effort is normal.  Musculoskeletal:     Cervical back: Normal range of motion.     Right lower leg: Edema present.     Left lower leg: No edema.     Comments: Right leg is very tight measures 43 cm relative to the left side which is 38   cm (both measured 10 cm from tibial tuberosity)  Skin:    General: Skin is warm.  Neurological:     Mental Status: He is alert.  Psychiatric:        Mood and Affect: Mood normal.        Thought Content: Thought content normal.     Data: RIGHT    CompressibilityPhasicitySpontaneityPropertiesThrombus Aging      +---------+---------------+---------+-----------+----------+---------------  --+  CFV     None           No       No                   Age  Indeterminate  +---------+---------------+---------+-----------+----------+---------------  --+  SFJ     Full                                                             +---------+---------------+---------+-----------+----------+---------------  --+  FV Prox  Partial        Yes      No                   Age  Indeterminate  +---------+---------------+---------+-----------+----------+---------------  --+  FV Mid   Partial        Yes      No                   Age  Indeterminate  +---------+---------------+---------+-----------+----------+---------------  --+  FV DistalPartial        Yes      No                   Age  Indeterminate  +---------+---------------+---------+-----------+----------+---------------  --+  PFV     Full                                                             +---------+---------------+---------+-----------+----------+---------------  --+  POP     Full           Yes      No                                        +---------+---------------+---------+-----------+----------+---------------  --+  PTV     Full                                                             +---------+---------------+---------+-----------+----------+---------------  --+  PERO    Full                                                             +---------+---------------+---------+-----------+----------+---------------  --+    Gastroc Full                                                             +---------+---------------+---------+-----------+----------+---------------  --+  CIV                    Yes      No                                       +---------+---------------+---------+-----------+----------+---------------  --+  EIV                    No       No                   Age  Indeterminate  +---------+---------------+---------+-----------+----------+---------------      +----+---------------+---------+-----------+----------+--------------+  LEFTCompressibilityPhasicitySpontaneityPropertiesThrombus Aging  +----+---------------+---------+-----------+----------+--------------+  CFV Full           Yes      Yes                                  +----+---------------+---------+-----------+----------+--------------+      Summary:  RIGHT:  - Findings consistent with age indeterminate deep vein thrombosis  involving the right external iliac vein, right common femoral vein, and  right femoral vein.  - No cystic structure found in the popliteal fossa.    LEFT:  - No evidence of common femoral vein obstruction.         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  This is consistent with chronic DVT given his history of extensive right lower extremity DVT  extending back to July.  Currently not taking any anticoagulants.  Given the significant symptoms of post thrombotic syndrome I have recommended venography from a prone popliteal or small saphenous vein approach.  If his femoral veins are completely occluded would need common femoral vein approach to rule out any central venous processes.  I discussed with the patient and his daughter that even with intervention his leg is unlikely to return to normal but that potentially we can get it down to approximately 5% larger than the left currently is more than 10% larger.  We discussed the risk and benefits of the procedure possible need for anticoagulation in the future given that he has not had any further bleeding issues.  All of their questions were answered and we will get him scheduled for Monday in the near future.     Sarvesh Meddaugh Christopher Iliana Hutt MD [facility]   

## 2022-09-06 ENCOUNTER — Telehealth: Payer: Self-pay

## 2022-09-06 NOTE — Telephone Encounter (Signed)
Attempted to reach pt's daughter to schedule his procedure. LVM requesting they call us back.

## 2022-09-07 ENCOUNTER — Other Ambulatory Visit: Payer: Self-pay

## 2022-09-07 DIAGNOSIS — I87001 Postthrombotic syndrome without complications of right lower extremity: Secondary | ICD-10-CM

## 2022-09-17 ENCOUNTER — Other Ambulatory Visit: Payer: Self-pay

## 2022-09-17 ENCOUNTER — Observation Stay (HOSPITAL_COMMUNITY)
Admission: RE | Admit: 2022-09-17 | Discharge: 2022-09-18 | Disposition: A | Discharge status: Home / Self Care | Payer: Medicare HMO | Source: Ambulatory Visit | Attending: Vascular Surgery | Admitting: Vascular Surgery

## 2022-09-17 ENCOUNTER — Encounter (HOSPITAL_COMMUNITY)
Admission: RE | Disposition: A | Discharge status: Home / Self Care | Payer: Self-pay | Source: Ambulatory Visit | Attending: Vascular Surgery

## 2022-09-17 DIAGNOSIS — I87021 Postthrombotic syndrome with inflammation of right lower extremity: Secondary | ICD-10-CM | POA: Diagnosis not present

## 2022-09-17 DIAGNOSIS — I4891 Unspecified atrial fibrillation: Secondary | ICD-10-CM | POA: Insufficient documentation

## 2022-09-17 DIAGNOSIS — I82501 Chronic embolism and thrombosis of unspecified deep veins of right lower extremity: Secondary | ICD-10-CM | POA: Diagnosis not present

## 2022-09-17 DIAGNOSIS — I82509 Chronic embolism and thrombosis of unspecified deep veins of unspecified lower extremity: Secondary | ICD-10-CM | POA: Diagnosis present

## 2022-09-17 DIAGNOSIS — Z79899 Other long term (current) drug therapy: Secondary | ICD-10-CM | POA: Diagnosis not present

## 2022-09-17 DIAGNOSIS — E119 Type 2 diabetes mellitus without complications: Secondary | ICD-10-CM | POA: Insufficient documentation

## 2022-09-17 DIAGNOSIS — I10 Essential (primary) hypertension: Secondary | ICD-10-CM | POA: Diagnosis not present

## 2022-09-17 DIAGNOSIS — I87001 Postthrombotic syndrome without complications of right lower extremity: Principal | ICD-10-CM

## 2022-09-17 DIAGNOSIS — Z87891 Personal history of nicotine dependence: Secondary | ICD-10-CM | POA: Diagnosis not present

## 2022-09-17 HISTORY — PX: LOWER EXTREMITY VENOGRAPHY: CATH118253

## 2022-09-17 HISTORY — PX: PERIPHERAL VASCULAR INTERVENTION: CATH118257

## 2022-09-17 LAB — CBC
HCT: 40.8 % (ref 39.0–52.0)
Hemoglobin: 12.8 g/dL — ABNORMAL LOW (ref 13.0–17.0)
MCH: 28.4 pg (ref 26.0–34.0)
MCHC: 31.4 g/dL (ref 30.0–36.0)
MCV: 90.5 fL (ref 80.0–100.0)
Platelets: 211 10*3/uL (ref 150–400)
RBC: 4.51 MIL/uL (ref 4.22–5.81)
RDW: 14.6 % (ref 11.5–15.5)
WBC: 5.5 10*3/uL (ref 4.0–10.5)
nRBC: 0 % (ref 0.0–0.2)

## 2022-09-17 LAB — GLUCOSE, CAPILLARY
Glucose-Capillary: 90 mg/dL (ref 70–99)
Glucose-Capillary: 83 mg/dL (ref 70–99)

## 2022-09-17 LAB — CREATININE, SERUM
Creatinine, Ser: 1.16 mg/dL (ref 0.61–1.24)
GFR, Estimated: >60 mL/min (ref >60)

## 2022-09-17 SURGERY — LOWER EXTREMITY VENOGRAPHY
Anesthesia: LOCAL | Laterality: Right

## 2022-09-17 MED ORDER — CLOPIDOGREL BISULFATE 300 MG PO TABS
ORAL_TABLET | ORAL | Status: DC | PRN
  Administered 2022-09-17: 300 mg via ORAL

## 2022-09-17 MED ORDER — GABAPENTIN 300 MG PO CAPS
300.0000 mg | ORAL_CAPSULE | Freq: Three times a day (TID) | ORAL | Status: DC
  Administered 2022-09-17 – 2022-09-18 (×3): 300 mg via ORAL
  Filled 2022-09-17 (×3): qty 1

## 2022-09-17 MED ORDER — FERROUS SULFATE 325 (65 FE) MG PO TABS
325.0000 mg | ORAL_TABLET | Freq: Every day | ORAL | Status: DC
  Administered 2022-09-18: 325 mg via ORAL
  Filled 2022-09-17: qty 1

## 2022-09-17 MED ORDER — HYDRALAZINE HCL 20 MG/ML IJ SOLN: 5.0000 mg | INTRAMUSCULAR | Status: DC | PRN

## 2022-09-17 MED ORDER — CLOPIDOGREL BISULFATE 75 MG PO TABS
75.0000 mg | ORAL_TABLET | Freq: Every day | ORAL | Status: DC
  Administered 2022-09-18: 75 mg via ORAL
  Filled 2022-09-17: qty 1

## 2022-09-17 MED ORDER — SODIUM CHLORIDE 0.9% FLUSH: 3.0000 mL | INTRAVENOUS | Status: DC | PRN

## 2022-09-17 MED ORDER — PRENATAL MULTIVITAMIN CH
1.0000 | ORAL_TABLET | Freq: Every day | ORAL | Status: DC
  Filled 2022-09-17: qty 1

## 2022-09-17 MED ORDER — MIDAZOLAM HCL 2 MG/2ML IJ SOLN
INTRAMUSCULAR | Status: DC | PRN
  Administered 2022-09-17: 1 mg via INTRAVENOUS

## 2022-09-17 MED ORDER — CHLORHEXIDINE GLUCONATE CLOTH 2 % EX PADS: 6.0000 | MEDICATED_PAD | Freq: Every day | CUTANEOUS | Status: DC

## 2022-09-17 MED ORDER — HEPARIN (PORCINE) IN NACL 1000-0.9 UT/500ML-% IV SOLN
INTRAVENOUS | Status: DC | PRN
  Administered 2022-09-17 (×2): 500 mL

## 2022-09-17 MED ORDER — HEPARIN SODIUM (PORCINE) 1000 UNIT/ML IJ SOLN
INTRAMUSCULAR | Status: AC
  Filled 2022-09-17: qty 10

## 2022-09-17 MED ORDER — LIDOCAINE HCL (PF) 1 % IJ SOLN
INTRAMUSCULAR | Status: DC | PRN
  Administered 2022-09-17: 2 mL

## 2022-09-17 MED ORDER — ACETAMINOPHEN 325 MG PO TABS: 650.0000 mg | ORAL_TABLET | ORAL | Status: DC | PRN

## 2022-09-17 MED ORDER — LABETALOL HCL 5 MG/ML IV SOLN
10.0000 mg | INTRAVENOUS | Status: DC | PRN
  Filled 2022-09-17: qty 4

## 2022-09-17 MED ORDER — FINASTERIDE 5 MG PO TABS
5.0000 mg | ORAL_TABLET | Freq: Every day | ORAL | Status: DC
  Administered 2022-09-17 – 2022-09-18 (×2): 5 mg via ORAL
  Filled 2022-09-17 (×2): qty 1

## 2022-09-17 MED ORDER — OXYCODONE HCL 5 MG PO TABS: 5.0000 mg | ORAL_TABLET | ORAL | Status: DC | PRN

## 2022-09-17 MED ORDER — SODIUM CHLORIDE 0.9% FLUSH
3.0000 mL | Freq: Two times a day (BID) | INTRAVENOUS | Status: DC
  Administered 2022-09-17 – 2022-09-18 (×2): 3 mL via INTRAVENOUS

## 2022-09-17 MED ORDER — CLOPIDOGREL BISULFATE 300 MG PO TABS
ORAL_TABLET | ORAL | Status: AC
  Filled 2022-09-17: qty 1

## 2022-09-17 MED ORDER — VERAPAMIL HCL ER 120 MG PO TBCR
120.0000 mg | EXTENDED_RELEASE_TABLET | Freq: Every day | ORAL | Status: DC
  Administered 2022-09-18: 120 mg via ORAL
  Filled 2022-09-17: qty 1

## 2022-09-17 MED ORDER — FENTANYL CITRATE (PF) 100 MCG/2ML IJ SOLN
INTRAMUSCULAR | Status: DC | PRN
  Administered 2022-09-17: 50 ug via INTRAVENOUS

## 2022-09-17 MED ORDER — SODIUM CHLORIDE 0.9 % IV SOLN: 250.0000 mL | INTRAVENOUS | Status: DC | PRN

## 2022-09-17 MED ORDER — SODIUM CHLORIDE 0.9 % IV SOLN: INTRAVENOUS | Status: DC

## 2022-09-17 MED ORDER — LISINOPRIL 20 MG PO TABS
40.0000 mg | ORAL_TABLET | Freq: Every day | ORAL | Status: DC
  Administered 2022-09-18: 40 mg via ORAL
  Filled 2022-09-17: qty 2

## 2022-09-17 MED ORDER — VITAMIN B-12 1000 MCG PO TABS
1000.0000 ug | ORAL_TABLET | Freq: Every day | ORAL | Status: DC
  Administered 2022-09-17 – 2022-09-18 (×2): 1000 ug via ORAL
  Filled 2022-09-17 (×2): qty 1

## 2022-09-17 MED ORDER — FOLIC ACID 1 MG PO TABS
1.0000 mg | ORAL_TABLET | Freq: Every day | ORAL | Status: DC
  Administered 2022-09-17 – 2022-09-18 (×2): 1 mg via ORAL
  Filled 2022-09-17 (×2): qty 1

## 2022-09-17 MED ORDER — LIDOCAINE HCL (PF) 1 % IJ SOLN
INTRAMUSCULAR | Status: AC
  Filled 2022-09-17: qty 30

## 2022-09-17 MED ORDER — SODIUM CHLORIDE 0.9 % IV SOLN: INTRAVENOUS | Status: AC

## 2022-09-17 MED ORDER — MORPHINE SULFATE (PF) 2 MG/ML IV SOLN: 2.0000 mg | INTRAVENOUS | Status: DC | PRN

## 2022-09-17 MED ORDER — HEPARIN SODIUM (PORCINE) 1000 UNIT/ML IJ SOLN
INTRAMUSCULAR | Status: DC | PRN
  Administered 2022-09-17: 10000 [IU] via INTRAVENOUS

## 2022-09-17 MED ORDER — METOPROLOL SUCCINATE ER 100 MG PO TB24
100.0000 mg | ORAL_TABLET | Freq: Every day | ORAL | Status: DC
  Administered 2022-09-18: 100 mg via ORAL
  Filled 2022-09-17: qty 1

## 2022-09-17 MED ORDER — CLOPIDOGREL BISULFATE 75 MG PO TABS: 75.0000 mg | ORAL_TABLET | Freq: Every day | ORAL | Status: DC

## 2022-09-17 MED ORDER — CLOPIDOGREL BISULFATE 75 MG PO TABS: 300.0000 mg | ORAL_TABLET | Freq: Once | ORAL | Status: DC

## 2022-09-17 MED ORDER — MIDAZOLAM HCL 2 MG/2ML IJ SOLN
INTRAMUSCULAR | Status: AC
  Filled 2022-09-17: qty 2

## 2022-09-17 MED ORDER — MAGNESIUM OXIDE -MG SUPPLEMENT 400 (240 MG) MG PO TABS
400.0000 mg | ORAL_TABLET | Freq: Every day | ORAL | Status: DC
  Administered 2022-09-17 – 2022-09-18 (×2): 400 mg via ORAL
  Filled 2022-09-17 (×3): qty 1

## 2022-09-17 MED ORDER — HEPARIN SODIUM (PORCINE) 5000 UNIT/ML IJ SOLN
5000.0000 [IU] | Freq: Three times a day (TID) | INTRAMUSCULAR | Status: DC
  Administered 2022-09-17 – 2022-09-18 (×2): 5000 [IU] via SUBCUTANEOUS
  Filled 2022-09-17 (×2): qty 1

## 2022-09-17 MED ORDER — FENTANYL CITRATE (PF) 100 MCG/2ML IJ SOLN
INTRAMUSCULAR | Status: AC
  Filled 2022-09-17: qty 2

## 2022-09-17 MED ORDER — IODIXANOL 320 MG/ML IV SOLN
INTRAVENOUS | Status: DC | PRN
  Administered 2022-09-17: 100 mL via INTRAVENOUS

## 2022-09-17 MED ORDER — ONDANSETRON HCL 4 MG/2ML IJ SOLN: 4.0000 mg | Freq: Four times a day (QID) | INTRAMUSCULAR | Status: DC | PRN

## 2022-09-17 MED ORDER — ERGOCALCIFEROL 1.25 MG (50000 UT) PO CAPS: 50000.0000 [IU] | ORAL_CAPSULE | ORAL | Status: DC

## 2022-09-17 SURGICAL SUPPLY — 17 items
BALLN MUSTANG 12X80X75 (BALLOONS) ×2
BALLN MUSTANG 8X80X75 (BALLOONS) ×2
BALLOON MUSTANG 12X80X75 (BALLOONS) IMPLANT
BALLOON MUSTANG 8X80X75 (BALLOONS) IMPLANT
CATH ANGIO 5F BER 100CM (CATHETERS) IMPLANT
CATH VISIONS PV .035 IVUS (CATHETERS) IMPLANT
GLIDEWIRE ADV .035X260CM (WIRE) IMPLANT
KIT ENCORE 26 ADVANTAGE (KITS) IMPLANT
KIT MICROPUNCTURE NIT STIFF (SHEATH) IMPLANT
KIT PV (KITS) ×2 IMPLANT
SHEATH PINNACLE 8F 10CM (SHEATH) IMPLANT
SHEATH PINNACLE 9F 10CM (SHEATH) IMPLANT
SHEATH PROBE COVER 6X72 (BAG) IMPLANT
STENT VENOUS ABRE 14X120 (Permanent Stent) IMPLANT
STENT VENOUS ABRE 14X80 (Permanent Stent) IMPLANT
TRANSDUCER W/STOPCOCK (MISCELLANEOUS) ×2 IMPLANT
TRAY PV CATH (CUSTOM PROCEDURE TRAY) ×2 IMPLANT

## 2022-09-17 NOTE — Op Note (Signed)
Patient name: Nathan [name] MRN: [id number] DOB: 05-30-1946 Sex: male  09/17/2022 Pre-operative Diagnosis: Post thrombotic syndrome right lower extremity Post-operative diagnosis:  Same Surgeon:  Erlene Quan C. Donzetta Matters, MD Procedure Performed: 1.  Ultrasound-guided cannulation right small saphenous vein 2.  Right lower extremity venogram 3.  Intravascular ultrasound right popliteal vein, right femoral vein, right common femoral vein, right external and common iliac veins and IVC 4.  Stent of right common and external iliac veins with central 14 x 120 mm Abre and peripheral 14 x 80 mm Abre postdilated with 12 mm balloon 5.  Moderate sedation with fentanyl and Versed for 46 minutes   Indications: 77 year old male with a history of an IVC filter placed for left lower extremity DVT when he had hematuria.  He subsequently developed significant swelling of the right lower extremity.  DVT in the past did include the common femoral vein as well as the entire right lower extremity.  He now has significant post thrombotic syndrome and is indicated for venography with intravascular ultrasound and possible intervention possible mechanical thrombectomy.  Findings: Small saphenous vein on the right measured approximately 1 cm this was cannulated we performed venography which demonstrated occlusion of the external and common iliac veins with disease extending down into the common femoral vein.  We were able to cross into what appeared to be sclerotic common and external iliac veins by intravascular ultrasound.  We ultimately stented from the IVC down through the common iliac vein on the right and the extrailiac vein on the right and postdilated with 12 mm balloon at completion there was a lumen of 10 mm in the mid segment where previously was occluded and there was brisk flow through the stents without retrograde reflux into the great saphenous vein and profunda branches as was preintervention.  Plan will be to keep  the patient on Plavix and not anticoagulate him given there is no acute thrombus and he has had issues with anticoagulation in the past.   Procedure:  The patient was identified in the holding area and taken to room 8.  The patient was then placed prone on the table and prepped and draped in the usual sterile fashion.  A time out was called.  Ultrasound was used to evaluate the right small saphenous vein which was severely dilated but was compressible.  The area was anesthetized with 1% lidocaine cannulated with a micropuncture needle followed by wire and sheath.  And images saved to the permanent record.  We placed a Glidewire advantage followed by 8 [place] sheath.  We then heparinized the patient.  Venogram demonstrated occlusion above the common femoral vein.  I was able to use a Berenstein catheter and Glidewire advantage to traverse all the way through to the cava and confirmed intraluminal access and then placed a Glidewire advantage into the SVC.  We then began with intravascular ultrasound which demonstrated occlusion of the common and external neck veins on the right.  We then predilated with a 8 mm balloon and plan for stenting which was measured by IVUS.  We primarily stented centrally with a 14 x 120 mm stent and peripherally with a 14 x 80 mm stent and these were postdilated with 12 mm balloon.  Completion demonstrated brisk flow with a lumen of 10 mm in the mid segment of the stent.  Satisfied with this the wire was removed the sheath was removed and the suture bolster was placed with a U-stitch 0 silk suture.  The patient's leg will  be wrapped to help with edema resolution.  He tolerated the procedure very well without immediate complication.  Contrast: 50cc  Elanah Osmanovic C. Donzetta Matters, MD [facility] [place] [facility] Office: [phone] Pager: [phone]

## 2022-09-17 NOTE — Progress Notes (Signed)
Carlis Abbott, MD notified of hematuria, no new orders.  Will monitor and they will check CBC in AM

## 2022-09-17 NOTE — Interval H&P Note (Signed)
History and Physical Interval Note:  09/17/2022 9:51 AM  Nathan [name]  has presented today for surgery, with the diagnosis of post thrombotic syndrome of rt leg.  The various methods of treatment have been discussed with the patient and family. After consideration of risks, benefits and other options for treatment, the patient has consented to  Procedure(s): LOWER EXTREMITY VENOGRAPHY (Right) as a surgical intervention.  The patient's history has been reviewed, patient examined, no change in status, stable for surgery.  I have reviewed the patient's chart and labs.  Questions were answered to the patient's satisfaction.     Servando Snare

## 2022-09-18 ENCOUNTER — Encounter (HOSPITAL_COMMUNITY): Payer: Self-pay | Admitting: Vascular Surgery

## 2022-09-18 DIAGNOSIS — I87021 Postthrombotic syndrome with inflammation of right lower extremity: Secondary | ICD-10-CM | POA: Diagnosis not present

## 2022-09-18 DIAGNOSIS — I87001 Postthrombotic syndrome without complications of right lower extremity: Secondary | ICD-10-CM | POA: Diagnosis not present

## 2022-09-18 LAB — COMPREHENSIVE METABOLIC PANEL
ALT: 11 U/L (ref 0–44)
AST: 17 U/L (ref 15–41)
Albumin: 3.1 g/dL — ABNORMAL LOW (ref 3.5–5.0)
Alkaline Phosphatase: 51 U/L (ref 38–126)
Anion gap: 8 (ref 5–15)
BUN: 11 mg/dL (ref 8–23)
CO2: 23 mmol/L (ref 22–32)
Calcium: 8.7 mg/dL — ABNORMAL LOW (ref 8.9–10.3)
Chloride: 107 mmol/L (ref 98–111)
Creatinine, Ser: 1.05 mg/dL (ref 0.61–1.24)
GFR, Estimated: >60 mL/min (ref >60)
Glucose, Bld: 110 mg/dL — ABNORMAL HIGH (ref 70–99)
Potassium: 3.8 mmol/L (ref 3.5–5.1)
Sodium: 138 mmol/L (ref 135–145)
Total Bilirubin: 0.7 mg/dL (ref 0.3–1.2)
Total Protein: 5.9 g/dL — ABNORMAL LOW (ref 6.5–8.1)

## 2022-09-18 LAB — LIPID PANEL
Cholesterol: 138 mg/dL (ref 0–200)
HDL: 40 mg/dL — ABNORMAL LOW (ref >40)
LDL Cholesterol: 88 mg/dL (ref 0–99)
Total CHOL/HDL Ratio: 3.5 RATIO
Triglycerides: 51 mg/dL (ref <150)
VLDL: 10 mg/dL (ref 0–40)

## 2022-09-18 LAB — CBC
HCT: 35.8 % — ABNORMAL LOW (ref 39.0–52.0)
Hemoglobin: 11.6 g/dL — ABNORMAL LOW (ref 13.0–17.0)
MCH: 28.7 pg (ref 26.0–34.0)
MCHC: 32.4 g/dL (ref 30.0–36.0)
MCV: 88.6 fL (ref 80.0–100.0)
Platelets: 189 10*3/uL (ref 150–400)
RBC: 4.04 MIL/uL — ABNORMAL LOW (ref 4.22–5.81)
RDW: 14.4 % (ref 11.5–15.5)
WBC: 4.8 10*3/uL (ref 4.0–10.5)
nRBC: 0 % (ref 0.0–0.2)

## 2022-09-18 MED ORDER — CLOPIDOGREL BISULFATE 75 MG PO TABS: 75.0000 mg | ORAL_TABLET | Freq: Every day | ORAL | 2 refills | Status: DC

## 2022-09-18 MED ORDER — OXYCODONE HCL 5 MG PO TABS: 5.0000 mg | ORAL_TABLET | Freq: Four times a day (QID) | ORAL | 0 refills | Status: DC | PRN

## 2022-09-18 NOTE — Discharge Instructions (Signed)
° °  [facility] ° °Discharge Instructions ° °Lower Extremity Angiogram; Angioplasty/Stenting ° °Please refer to the following instructions for your post-procedure care. Your surgeon or physician assistant will discuss any changes with you. ° °Activity ° °Avoid lifting more than 8 pounds (1 gallons of milk) for 72 hours (3 days) after your procedure. You may walk as much as you can tolerate. It's OK to drive after 72 hours. ° °Bathing/Showering ° °You may shower the day after your procedure. If you have a bandage, you may remove it at 24- 48 hours. Clean your incision site with mild soap and water. Pat the area dry with a clean towel. ° °Diet ° °Resume your pre-procedure diet. There are no special food restrictions following this procedure. All patients with peripheral vascular disease should follow a low fat/low cholesterol diet. In order to heal from your surgery, it is CRITICAL to get adequate nutrition. Your body requires vitamins, minerals, and protein. Vegetables are the best source of vitamins and minerals. Vegetables also provide the perfect balance of protein. Processed food has little nutritional value, so try to avoid this. ° °Medications ° °Resume taking all of your medications unless your doctor tells you not to. If your incision is causing pain, you may take over-the-counter pain relievers such as acetaminophen (Tylenol) ° °Follow Up ° °Follow up will be arranged at the time of your procedure. You may have an office visit scheduled or may be scheduled for surgery. Ask your surgeon if you have any questions. ° °Please call us immediately for any of the following conditions: °•Severe or worsening pain your legs or feet at rest or with walking. °•Increased pain, redness, drainage at your groin puncture site. °•Fever of 101 degrees or higher. °•If you have any mild or slow bleeding from your puncture site: lie down, apply firm constant pressure over the area with a piece of  gauze or a clean wash cloth for 30 minutes- no peeking!, call 911 right away if you are still bleeding after 30 minutes, or if the bleeding is heavy and unmanageable. ° °Reduce your risk factors of vascular disease: ° °Stop smoking. If you would like help call [facility] at 1-800-QUIT-NOW (1-800-784-8669) or [place] at 336-586-4000. °Manage your cholesterol °Maintain a desired weight °Control your diabetes °Keep your blood pressure down ° °If you have any questions, please call the office at [phone] ° °

## 2022-09-18 NOTE — Plan of Care (Signed)

## 2022-09-18 NOTE — Progress Notes (Addendum)
  Progress Note    09/18/2022 7:42 AM 1 Day Post-Op  Subjective:  R leg feels a little better compared to before surgery   Vitals:   09/18/22 0000 09/18/22 0341  BP: (!) 143/78 139/76  Pulse: 93 71  Resp: 20 20  Temp: 98.6 F (37 C) 98.6 F (37 C)  SpO2: 91% 93%   Physical Exam: Lungs:  non labored Extremities:  R calf soft, motor and sensation intact R foot Abdomen:  soft, NT, ND Neurologic: A&O  CBC    Component Value Date/Time   WBC 4.8 09/18/2022 0219   RBC 4.04 (L) 09/18/2022 0219   HGB 11.6 (L) 09/18/2022 0219   HCT 35.8 (L) 09/18/2022 0219   PLT 189 09/18/2022 0219   MCV 88.6 09/18/2022 0219   MCH 28.7 09/18/2022 0219   MCHC 32.4 09/18/2022 0219   RDW 14.4 09/18/2022 0219   LYMPHSABS 0.9 09/04/2022 1417   MONOABS 0.6 09/04/2022 1417   EOSABS 0.1 09/04/2022 1417   BASOSABS 0.0 09/04/2022 1417    BMET    Component Value Date/Time   NA 138 09/18/2022 0219   NA 147 (H) 06/09/2019 1447   K 3.8 09/18/2022 0219   CL 107 09/18/2022 0219   CO2 23 09/18/2022 0219   GLUCOSE 110 (H) 09/18/2022 0219   BUN 11 09/18/2022 0219   BUN 16 06/09/2019 1447   CREATININE 1.05 09/18/2022 0219   CALCIUM 8.7 (L) 09/18/2022 0219   GFRNONAA >60 09/18/2022 0219   GFRAA >60 02/28/2020 0238    INR    Component Value Date/Time   INR 1.2 02/27/2022 1420     Intake/Output Summary (Last 24 hours) at 09/18/2022 0742 Last data filed at 09/17/2022 1800 Gross per 24 hour  Intake 718.02 ml  Output 800 ml  Net -81.98 ml     Assessment/Plan:  77 y.o. male is s/p mechanical thrombectomy R iliac with stenting 1 Day Post-Op   R leg subjectively better this morning Thigh high compression sock ordered Prescription written for plavix Ok for discharge after wrap removed, suture behind the knee removed, and compression sock applied Office will arrange IVC/iliac venous duplex in 1 month   Dagoberto Ligas, PA-C [facility] [phone] 09/18/2022 7:42  AM  I have independently interviewed and examined patient and agree with PA assessment and plan above.  Patient's right lower extremity with much improved edema and much softer today.  Plan will be for Plavix given his history of hematuria with anticoagulation and even had hematuria yesterday after the administration of heparin.  We will fit for compression sock today and plan for discharge with follow-up in 1 month with IVC iliac duplex.  IVC filter was patent and given his inability to have anticoagulation would likely leave in place indefinitely.  Angele Wiemann C. Donzetta Matters, MD [facility] [place] Office: [phone] Pager: [phone]

## 2022-09-19 NOTE — Discharge Summary (Signed)
Discharge Summary  Patient ID: Nathan [name] [id number] 76 y.o. [birth year]  Admit date: 09/17/2022  Discharge date and time: 09/18/2022 12:02 PM   Admitting Physician: Waynetta Sandy, MD   Discharge Physician: same  Admission Diagnoses: Chronic deep vein thrombosis (DVT) ([place]) [I82.509]  Discharge Diagnoses: same  Admission Condition: fair  Discharged Condition: fair  Indication for Admission: post op care  Hospital Course: Nathan [name] is a 77 year old male who was brought in as an outpatient and underwent stenting of the right common and external iliac veins due to post thrombotic syndrome of the right lower extremity.  This was performed by Dr. Donzetta Matters on 09/17/2022.  He tolerated the procedure and was admitted to the hospital postoperatively.  POD #1 subjectively the right leg felt better and less tight.  He was fitted for thigh-high 20 to 30 mmHg compression to wear regularly.  He was also prescribed Plavix.  He is unable to tolerate anticoagulation due to hematuria.  He will follow-up in office in 1 month with an IVC/iliac venous duplex.  He was discharged home in stable condition.  Consults: None  Treatments: surgery: Venogram with right common and external iliac venous stenting by Dr. Donzetta Matters on 09/17/2022  Discharge Exam: See progress note 09/18/22 Vitals:   09/18/22 0822 09/18/22 0846  BP: (!) 116/102 138/64  Pulse: 72 67  Resp: (!) 21   Temp: 98.3 F (36.8 C)   SpO2:       Disposition: Discharge disposition: 01-Home or Self Care       Patient Instructions:  Allergies as of 09/18/2022   No Known Allergies      Medication List     TAKE these medications    clopidogrel 75 MG tablet Commonly known as: PLAVIX Take 1 tablet (75 mg total) by mouth daily.   cyanocobalamin 1000 MCG tablet Commonly known as: VITAMIN B12 Take 1,000 mcg by mouth daily.   ergocalciferol 1.25 MG (50000 UT) capsule Commonly known as: VITAMIN D2 Take 50,000 Units  by mouth once a week.   ferrous sulfate 325 (65 FE) MG tablet Take 325 mg by mouth daily with breakfast.   finasteride 5 MG tablet Commonly known as: PROSCAR Take 5 mg by mouth daily.   Fish Oil 1000 MG Caps Take 1,000 mg by mouth daily.   folic acid 1 MG tablet Commonly known as: FOLVITE Take 1 mg by mouth daily.   gabapentin 300 MG capsule Commonly known as: NEURONTIN Take 300 mg by mouth 3 (three) times daily.   lisinopril 40 MG tablet Commonly known as: ZESTRIL Take 40 mg by mouth daily.   magnesium oxide 400 MG tablet Commonly known as: MAG-OX Take 400 mg by mouth daily.   metoprolol succinate 100 MG 24 hr tablet Commonly known as: TOPROL-XL Take 100 mg by mouth daily.   Muscle Rub 10-15 % Crea Apply 1 Application topically as needed for muscle pain.   OVER THE COUNTER MEDICATION Take 1 capsule by mouth daily as needed (Siatic Nerve pain). Sciatiease   oxyCODONE 5 MG immediate release tablet Commonly known as: Oxy IR/ROXICODONE Take 1 tablet (5 mg total) by mouth every 6 (six) hours as needed for moderate pain.   prenatal multivitamin Tabs tablet Take 1 tablet by mouth daily at 12 noon.   verapamil 120 MG 24 hr capsule Commonly known as: VERELAN PM Take 120 mg by mouth daily.       Activity: activity as tolerated Diet: regular diet Wound Care: none needed  Follow-up  with [facility] in 1 month.  Signed: Dagoberto Ligas, PA-C 09/19/2022 1:30 PM [facility] Office: [phone]

## 2022-09-29 ENCOUNTER — Encounter (HOSPITAL_COMMUNITY): Payer: Self-pay

## 2022-09-29 ENCOUNTER — Emergency Department (HOSPITAL_COMMUNITY)
Admission: EM | Admit: 2022-09-29 | Discharge: 2022-09-29 | Disposition: A | Discharge status: Home / Self Care | Payer: Medicare HMO | Attending: Student | Admitting: Student

## 2022-09-29 ENCOUNTER — Emergency Department (HOSPITAL_COMMUNITY): Payer: Medicare HMO

## 2022-09-29 DIAGNOSIS — I10 Essential (primary) hypertension: Secondary | ICD-10-CM | POA: Insufficient documentation

## 2022-09-29 DIAGNOSIS — Z87891 Personal history of nicotine dependence: Secondary | ICD-10-CM | POA: Insufficient documentation

## 2022-09-29 DIAGNOSIS — Z79899 Other long term (current) drug therapy: Secondary | ICD-10-CM | POA: Insufficient documentation

## 2022-09-29 DIAGNOSIS — R319 Hematuria, unspecified: Secondary | ICD-10-CM | POA: Insufficient documentation

## 2022-09-29 DIAGNOSIS — E876 Hypokalemia: Secondary | ICD-10-CM | POA: Diagnosis not present

## 2022-09-29 DIAGNOSIS — Z7984 Long term (current) use of oral hypoglycemic drugs: Secondary | ICD-10-CM | POA: Insufficient documentation

## 2022-09-29 DIAGNOSIS — E119 Type 2 diabetes mellitus without complications: Secondary | ICD-10-CM | POA: Insufficient documentation

## 2022-09-29 DIAGNOSIS — Z8546 Personal history of malignant neoplasm of prostate: Secondary | ICD-10-CM | POA: Diagnosis not present

## 2022-09-29 LAB — COMPREHENSIVE METABOLIC PANEL
ALT: 20 U/L (ref 0–44)
AST: 16 U/L (ref 15–41)
Albumin: 3.6 g/dL (ref 3.5–5.0)
Alkaline Phosphatase: 61 U/L (ref 38–126)
Anion gap: 7 (ref 5–15)
BUN: 13 mg/dL (ref 8–23)
CO2: 24 mmol/L (ref 22–32)
Calcium: 8.7 mg/dL — ABNORMAL LOW (ref 8.9–10.3)
Chloride: 105 mmol/L (ref 98–111)
Creatinine, Ser: 0.96 mg/dL (ref 0.61–1.24)
GFR, Estimated: >60 mL/min (ref >60)
Glucose, Bld: 87 mg/dL (ref 70–99)
Potassium: 3.3 mmol/L — ABNORMAL LOW (ref 3.5–5.1)
Sodium: 136 mmol/L (ref 135–145)
Total Bilirubin: 0.7 mg/dL (ref 0.3–1.2)
Total Protein: 6.9 g/dL (ref 6.5–8.1)

## 2022-09-29 LAB — CBC WITH DIFFERENTIAL/PLATELET
Abs Immature Granulocytes: 0.01 10*3/uL (ref 0.00–0.07)
Basophils Absolute: 0 10*3/uL (ref 0.0–0.1)
Basophils Relative: 1 %
Eosinophils Absolute: 0.2 10*3/uL (ref 0.0–0.5)
Eosinophils Relative: 4 %
HCT: 38 % — ABNORMAL LOW (ref 39.0–52.0)
Hemoglobin: 11.9 g/dL — ABNORMAL LOW (ref 13.0–17.0)
Immature Granulocytes: 0 %
Lymphocytes Relative: 20 %
Lymphs Abs: 0.9 10*3/uL (ref 0.7–4.0)
MCH: 28.1 pg (ref 26.0–34.0)
MCHC: 31.3 g/dL (ref 30.0–36.0)
MCV: 89.6 fL (ref 80.0–100.0)
Monocytes Absolute: 0.6 10*3/uL (ref 0.1–1.0)
Monocytes Relative: 15 %
Neutro Abs: 2.5 10*3/uL (ref 1.7–7.7)
Neutrophils Relative %: 60 %
Platelets: 219 10*3/uL (ref 150–400)
RBC: 4.24 MIL/uL (ref 4.22–5.81)
RDW: 14.3 % (ref 11.5–15.5)
WBC: 4.2 10*3/uL (ref 4.0–10.5)
nRBC: 0 % (ref 0.0–0.2)

## 2022-09-29 LAB — PROTIME-INR
INR: 1.2 (ref 0.8–1.2)
Prothrombin Time: 15.2 seconds (ref 11.4–15.2)

## 2022-09-29 MED ORDER — IOHEXOL 300 MG/ML  SOLN
100.0000 mL | Freq: Once | INTRAMUSCULAR | Status: AC | PRN
  Administered 2022-09-29: 100 mL via INTRAVENOUS

## 2022-09-29 NOTE — ED Triage Notes (Signed)
Pt has had blood in his urine since yesterday. Pt had a procedure 3/6. Urine is dark red. Foley was irrigated by daughter and got it to [employer] but now is a dark red. Pt is on Plavix.

## 2022-09-29 NOTE — ED Provider Notes (Signed)
[facility] Provider Note  CSN: [id number] Arrival date & time: 09/29/22 1712  Chief Complaint(s) Hematuria  HPI Jasdeep Mckown is a 77 y.o. 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  Daughter states that patient had a previous history of gross hematuria that required cystoscopy and ablation of the prostate.  She states that despite being on Plavix since discharge she had not seen any gross hematuria until today.  She attempted to irrigate the Foley at home and did start to have some mild improvement but the hematuria then returned.  Patient's daughter states that she felt like she waited too long last time so came to the emergency department early due to concern for gross hematuria.  Patient denies chest pain, shortness of breath, abdominal pain, nausea, vomiting or other systemic symptoms.   Past Medical History Past Medical History:  Diagnosis Date   Anginal pain ([place])    Atrial fibrillation ([place]) 01/2020   resolved now    Cancer of prostate ([place]) 2020   Chronic anticoagulation    Diabetes mellitus without complication ([place])    type 2 diet controlled   Dysrhythmia    a fib   Foley catheter in place    will be changed 05-16-20   HTN (hypertension)    Lipoma of chest wall    Pneumonia 01/2020   and uti in hospital for 1 week   WPW (Wolff-Parkinson-White syndrome)    No prior ablation   Patient Active Problem List   Diagnosis Date Noted   Chronic deep vein thrombosis (DVT) ([place]) 09/17/2022   ABLA (acute blood loss anemia) 02/27/2022   Fever 01/24/2022   Acute blood loss anemia (ABLA) 01/24/2022   History of DVT (deep vein thrombosis) 01/12/2022   Gross hematuria 01/12/2022    Symptomatic anemia 01/11/2022   Acute DVT (deep venous thrombosis) ([place]) 12/09/2021   UTI (urinary tract infection) 12/09/2021   Essential hypertension 12/09/2021   Type 2 diabetes mellitus without complication, without long-term current use of insulin ([place]) 12/09/2021   Normocytic anemia 12/09/2021   Malignant neoplasm of prostate ([place]) 07/07/2019   Paroxysmal atrial fibrillation ([place]) 06/05/2019   Home Medication(s) Prior to Admission medications   Medication Sig Start Date End Date Taking? Authorizing Provider  clopidogrel (PLAVIX) 75 MG tablet Take 1 tablet (75 mg total) by mouth daily. 09/18/22   Dagoberto Ligas, PA-C  ergocalciferol (VITAMIN D2) 1.25 MG (50000 UT) capsule Take 50,000 Units by mouth once a week.    [provider]  ferrous sulfate 325 (65 FE) MG tablet Take 325 mg by mouth daily with breakfast.    [provider]  finasteride (PROSCAR) 5 MG tablet Take 5 mg by mouth daily.    [provider]  folic acid (FOLVITE) 1 MG tablet Take 1 mg by mouth daily. 03/24/19   [provider]  gabapentin (NEURONTIN) 300 MG capsule Take 300 mg by mouth 3 (three) times daily. 10/27/21   [provider]  lisinopril (ZESTRIL) 40 MG tablet Take 40 mg by mouth daily.    [provider]  magnesium oxide (MAG-OX) 400 MG tablet Take 400 mg by mouth daily.  03/24/19   [provider]  Menthol-Methyl Salicylate (MUSCLE RUB) 10-15 % CREA Apply 1  Application topically as needed for muscle pain.    [provider]  metoprolol succinate (TOPROL-XL) 100 MG 24 hr tablet Take 100 mg by mouth daily. 11/14/21   [provider]  Omega-3 Fatty Acids (FISH OIL) 1000 MG CAPS Take 1,000 mg by mouth daily.    [provider]  OVER THE COUNTER MEDICATION Take 1 capsule by mouth daily as needed (Siatic Nerve pain). Sciatiease    [provider]  oxyCODONE (OXY IR/ROXICODONE) 5 MG immediate release tablet Take 1 tablet (5  mg total) by mouth every 6 (six) hours as needed for moderate pain. 09/18/22   Dagoberto Ligas, PA-C  Prenatal Vit-Fe Fumarate-FA (PRENATAL MULTIVITAMIN) TABS tablet Take 1 tablet by mouth daily at 12 noon.    [provider]  verapamil (VERELAN PM) 120 MG 24 hr capsule Take 120 mg by mouth daily. 08/07/22   [provider]  vitamin B-12 (CYANOCOBALAMIN) 1000 MCG tablet Take 1,000 mcg by mouth daily.    [provider]                                                                                                                                    Past Surgical History Past Surgical History:  Procedure Laterality Date   CATARACT EXTRACTION Bilateral 2020   CYSTOSCOPY N/A 06/05/2021   Procedure: CYSTOSCOPY WITH SUPRA PUBIC TUBE PLACEMENT;  Surgeon: Raynelle Bring, MD;  Location: [facility];  Service: Urology;  Laterality: N/A;   CYSTOSCOPY WITH FULGERATION N/A 01/27/2022   Procedure: [place] WITH CLOT EVACUATION;  Surgeon: Ardis Hughs, MD;  Location: [facility];  Service: Urology;  Laterality: N/A;   CYSTOSCOPY WITH FULGERATION N/A 03/02/2022   Procedure: CYSTOSCOPY, CLOT EVACUTION WITH TRANSURETHERAL FULGERATION OF BLADDER;  Surgeon: Raynelle Bring, MD;  Location: [facility];  Service: Urology;  Laterality: N/A;   IR 3D INDEPENDENT WKST  01/17/2022   IR ANGIOGRAM PELVIS SELECTIVE OR SUPRASELECTIVE  01/17/2022   IR ANGIOGRAM SELECTIVE EACH ADDITIONAL VESSEL  01/17/2022   IR ANGIOGRAM SELECTIVE EACH ADDITIONAL VESSEL  01/17/2022   IR EMBO ART  VEN HEMORR LYMPH EXTRAV  INC GUIDE ROADMAPPING  01/17/2022   IR US GUIDE VASC ACCESS RIGHT  01/17/2022   LIPOMA EXCISION Left 05/16/2020   Procedure: EXCISION LIPOMA LEFT CHEST WALL;  Surgeon: Kieth Brightly Arta Bruce, MD;  Location: [facility];  Service: General;  Laterality: Left;   LOWER EXTREMITY VENOGRAPHY Right 09/17/2022   Procedure: LOWER EXTREMITY VENOGRAPHY;  Surgeon: Waynetta Sandy, MD;   Location: [facility];  Service: Cardiovascular;  Laterality: Right;   PERIPHERAL VASCULAR INTERVENTION  09/17/2022   Procedure: PERIPHERAL VASCULAR INTERVENTION;  Surgeon: Waynetta Sandy, MD;  Location: [facility];  Service: Cardiovascular;;   RADIOACTIVE SEED IMPLANT  2020   TRANSURETHRAL RESECTION OF BLADDER TUMOR N/A 01/17/2022   Procedure: CYSTOSCOPY WITH CLOT EVACUATION, FULGERATION OF PROSTATE;  Surgeon: Junious Silk,  Rodman Key, MD;  Location: [facility];  Service: Urology;  Laterality: N/A;   VENA CAVA FILTER PLACEMENT Right 12/10/2021   Procedure: INSERTION VENA-CAVA FILTER Right Femoral;  Surgeon: Waynetta Sandy, MD;  Location: [facility];  Service: Vascular;  Laterality: Right;   Family History Family History  Problem Relation Age of Onset   Lung cancer Mother    Cervical cancer Sister    Breast cancer Neg Hx    Pancreatic cancer Neg Hx    Colon cancer Neg Hx     Social History Social History   Tobacco Use   Smoking status: Former    Packs/day: 0.50    Years: 60.00    Additional pack years: 0.00    Total pack years: 30.00    Types: Cigarettes    Quit date: 01/31/2019    Years since quitting: 3.6   Smokeless tobacco: Never  Vaping Use   Vaping Use: Never used  Substance Use Topics   Alcohol use: Not Currently    Comment: no alochol since 2020   Drug use: Never   Allergies Patient has no known allergies.  Review of Systems Review of Systems  Genitourinary:  Positive for hematuria.    Physical Exam Vital Signs  I have reviewed the triage vital signs BP (!) 171/86 (BP Location: Left Arm)   Pulse 68   Temp 97.6 F (36.4 C) (Oral)   Resp 19   Ht 6\' 1"  (1.854 m)   Wt 93.4 kg   SpO2 97%   BMI 27.18 kg/m   Physical Exam Constitutional:      General: He is not in acute distress.    Appearance: Normal appearance.  HENT:     Head: Normocephalic and atraumatic.     Nose: No congestion or rhinorrhea.  Eyes:     General:        Right  eye: No discharge.        Left eye: No discharge.     Extraocular Movements: Extraocular movements intact.     Pupils: Pupils are equal, round, and reactive to light.  Cardiovascular:     Rate and Rhythm: Normal rate and regular rhythm.     Heart sounds: No murmur heard. Pulmonary:     Effort: No respiratory distress.     Breath sounds: No wheezing or rales.  Abdominal:     General: There is no distension.     Tenderness: There is no abdominal tenderness.  Musculoskeletal:        General: Normal range of motion.     Cervical back: Normal range of motion.  Skin:    General: Skin is warm and dry.  Neurological:     General: No focal deficit present.     Mental Status: He is alert.     ED Results and Treatments Labs (all labs ordered are listed, but only abnormal results are displayed) Labs Reviewed  COMPREHENSIVE METABOLIC PANEL - Abnormal; Notable for the following components:      Result Value   Potassium 3.3 (*)    Calcium 8.7 (*)    All other components within normal limits  CBC WITH DIFFERENTIAL/PLATELET - Abnormal; Notable for the following components:   Hemoglobin 11.9 (*)    HCT 38.0 (*)    All other components within normal limits  PROTIME-INR  Radiology CT ABDOMEN PELVIS W CONTRAST  Result Date: 09/29/2022 CLINICAL DATA:  Hematuria. EXAM: CT ABDOMEN AND PELVIS WITH CONTRAST TECHNIQUE: Multidetector CT imaging of the abdomen and pelvis was performed using the standard protocol following bolus administration of intravenous contrast. RADIATION DOSE REDUCTION: This exam was performed according to the departmental dose-optimization program which includes automated exposure control, adjustment of the mA and/or kV according to patient size and/or use of iterative reconstruction technique. CONTRAST:  139mL OMNIPAQUE IOHEXOL 300 MG/ML  SOLN COMPARISON:  CT  abdomen pelvis dated 01/13/2022. FINDINGS: Lower chest: Partially visualized small bilateral pleural effusions, right greater left, and new since the prior CT. There is associated partial compressive atelectasis of the lower lobes. Pneumonia is not excluded. There is coronary vascular calcification. No intra-abdominal free air or free fluid. Hepatobiliary: The liver is unremarkable. No biliary ductal dilatation. No calcified gallstone. Mild thickened appearance of the bladder wall. Pancreas: Unremarkable. No pancreatic ductal dilatation or surrounding inflammatory changes. Spleen: Normal in size without focal abnormality. Adrenals/Urinary Tract: The right adrenal gland is unremarkable. There is a 14 mm indeterminate left adrenal nodule, present on the prior CT. Slight interval increase in the size of an enhancing lesion from the anterior upper pole of the right kidney measuring approximately 12 mm (previously 10 mm). This is not characterized on this CT and may be better evaluated with ultrasound or MRI on a nonemergent/outpatient basis. Small bilateral renal cysts. There is no hydronephrosis on either side. There is symmetric enhancement and excretion of contrast by both kidneys. The visualized ureters appear unremarkable. The urinary bladder is decompressed around a suprapubic catheter. There is soft tissue mass in the posterior bladder wall which may represent an enlarged prostate with median lobe hypertrophy or a urothelial neoplasm. Correlation with history and further evaluation with cystoscopy, if clinically indicated, recommended. Stomach/Bowel: There is sigmoid diverticulosis without active inflammatory changes. There is no bowel obstruction or active inflammation. The appendix is normal. Vascular/Lymphatic: Mild aortoiliac atherosclerotic disease. An infrarenal IVC filter noted. A stent is noted in the right external iliac and common iliac veins. Probable scarring in the right common femoral vein. The IVC  is otherwise unremarkable. No portal venous gas. There is no adenopathy. Reproductive: Enlarged prostate gland with several fiducial markers. Other: None Musculoskeletal: Degenerative changes of the spine. No acute osseous pathology. IMPRESSION: 1. No acute intra-abdominal or pelvic pathology. 2. Soft tissue mass in the posterior bladder wall may represent an enlarged prostate versus a urothelial neoplasm. Correlation with history and further evaluation with cystoscopy, if clinically indicated, recommended. 3. Sigmoid diverticulosis. No bowel obstruction. Normal appendix. 4. Partially visualized small bilateral pleural effusions, right greater left, and new since the prior CT. 5. Slight interval increase in the size of an indeterminate lesion from the anterior upper pole of the right kidney. 6.  Aortic Atherosclerosis (ICD10-I70.0). Electronically Signed   By: Anner Crete M.D.   On: 09/29/2022 19:42    Pertinent labs & imaging results that were available during my care of the patient were reviewed by me and considered in my medical decision making (see MDM for details).  Medications Ordered in ED Medications  iohexol (OMNIPAQUE) 300 MG/ML solution 100 mL (100 mLs Intravenous Contrast Given 09/29/22 1907)  Procedures Procedures  (including critical care time)  Medical Decision Making / ED Course   This patient presents to the ED for concern of hematuria, this involves an extensive number of treatment options, and is a complaint that carries with it a high risk of complications and morbidity.  The differential diagnosis includes prostatic bleed, bladder cancer, prostate cancer, nephrolithiasis, bladder stone, supratherapeutic INR, coagulopathy  MDM: Patient seen emergency room for evaluation of hematuria.  Physical exam is unremarkable.  Laboratory evaluation is  reassuring with a stable hemoglobin at 11.9.  INR normal at 1.2.  Mild hypokalemia to 3.3.  CT abdomen pelvis showing soft tissue mass in the posterior bladder wall consistent with either an enlarged prostate or a urothelial neoplasm.  There is also a slight interval increase in the size of an indeterminate lesion in the anterior upper pole of the right kidney.  I spoke with Dr. Junious Silk of urology who states that if the hemoglobin is stable, patient can be seen outpatient next week.  I provided the daughter with additional supplies for Foley irrigation at home and given the patient has remained hemodynamically stable in the emergency department, patient discharged with outpatient urologic follow-up and strict return precautions of which both the patient and his daughter voiced understanding.  Of note, we did talk about the patient's Plavix use and we discussed risks and benefits surrounding patient's current hematuria versus protecting the patency of his vascular stents and we both agreed the patient will continue taking his Plavix to protect the vascular stents.   Additional history obtained: -Additional history obtained from daughter -External records from outside source obtained and reviewed including: Chart review including previous notes, labs, imaging, consultation notes   Lab Tests: -I ordered, reviewed, and interpreted labs.   The pertinent results include:   Labs Reviewed  COMPREHENSIVE METABOLIC PANEL - Abnormal; Notable for the following components:      Result Value   Potassium 3.3 (*)    Calcium 8.7 (*)    All other components within normal limits  CBC WITH DIFFERENTIAL/PLATELET - Abnormal; Notable for the following components:   Hemoglobin 11.9 (*)    HCT 38.0 (*)    All other components within normal limits  PROTIME-INR      Imaging Studies ordered: I ordered imaging studies including CT abdomen pelvis I independently visualized and interpreted imaging. I agree with the  radiologist interpretation   Medicines ordered and prescription drug management: Meds ordered this encounter  Medications   iohexol (OMNIPAQUE) 300 MG/ML solution 100 mL    -I have reviewed the patients home medicines and have made adjustments as needed  Critical interventions none  Consultations Obtained: I requested consultation with the urologist on-call Dr. Junious Silk,  and discussed lab and imaging findings as well as pertinent plan - they recommend: Outpatient follow-up   Cardiac Monitoring: The patient was maintained on a cardiac monitor.  I personally viewed and interpreted the cardiac monitored which showed an underlying rhythm of: NSR  Social Determinants of Health:  Factors impacting patients care include: none   Reevaluation: After the interventions noted above, I reevaluated the patient and found that they have :stayed the same  Co morbidities that complicate the patient evaluation  Past Medical History:  Diagnosis Date   Anginal pain ([place])    Atrial fibrillation ([place]) 01/2020   resolved now    Cancer of prostate ([place]) 2020   Chronic anticoagulation    Diabetes mellitus without complication ([place])    type 2 diet  controlled   Dysrhythmia    a fib   Foley catheter in place    will be changed 05-16-20   HTN (hypertension)    Lipoma of chest wall    Pneumonia 01/2020   and uti in hospital for 1 week   WPW (Wolff-Parkinson-White syndrome)    No prior ablation      Dispostion: I considered admission for this patient, and at this time he does not meet inpatient criteria for admission.  Patient discharged with outpatient urology follow-up and return precautions.     Final Clinical Impression(s) / ED Diagnoses Final diagnoses:  None     @PCDICTATION @    Teressa Lower, MD 09/30/22 [id number]

## 2022-10-10 ENCOUNTER — Other Ambulatory Visit: Payer: Self-pay | Admitting: *Deleted

## 2022-10-10 DIAGNOSIS — I87001 Postthrombotic syndrome without complications of right lower extremity: Secondary | ICD-10-CM

## 2022-10-17 ENCOUNTER — Ambulatory Visit (HOSPITAL_COMMUNITY)
Admission: RE | Admit: 2022-10-17 | Discharge: 2022-10-17 | Disposition: A | Discharge status: Home / Self Care | Payer: Managed Care, Other (non HMO) | Source: Ambulatory Visit | Attending: Vascular Surgery | Admitting: Vascular Surgery

## 2022-10-17 ENCOUNTER — Ambulatory Visit (INDEPENDENT_AMBULATORY_CARE_PROVIDER_SITE_OTHER): Payer: Managed Care, Other (non HMO) | Admitting: Vascular Surgery

## 2022-10-17 ENCOUNTER — Encounter: Payer: Self-pay | Admitting: Vascular Surgery

## 2022-10-17 VITALS — BP 144/77 | HR 59 | Temp 97.8°F | Resp 20 | Ht 73.0 in | Wt 175.0 lb

## 2022-10-17 DIAGNOSIS — I87001 Postthrombotic syndrome without complications of right lower extremity: Secondary | ICD-10-CM

## 2022-10-17 NOTE — Progress Notes (Signed)
Patient ID: Nathan [name], male   DOB: 05-05-1946, 77 y.o.   MRN: [id number]  Reason for Consult: Routine Post Op   Referred by Dois Davenport, MD  Subjective:     HPI:  Nathan [name] is a 77 y.o. 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  He later show to my office about 1 month ago with extensive right lower extremity swelling and we were able to stent his right common and external iliac veins and he has now had dramatic improvement in his edema in the right lower extremity to the point where it is now the same size of the left lower extremity and soft and he has no further pain.  He continues to walk with the help of a cane but does not require walker or wheelchair since the procedure.  He is wearing intermittent compression stockings.  He has been evaluated emergency department for recurrent hematuria through his suprapubic catheter and has a few clots today but he has been cleared to continue Plavix and we decided he is not a candidate for anticoagulation.  Past Medical History:  Diagnosis Date   Anginal pain    Atrial fibrillation 01/2020   resolved now    Cancer of prostate 2020   Chronic anticoagulation    Diabetes mellitus without complication    type 2 diet controlled   Dysrhythmia    a fib   Foley catheter in place    will be changed 05-16-20   HTN (hypertension)    Lipoma of chest wall    Pneumonia 01/2020   and uti in hospital for 1 week   WPW (Wolff-Parkinson-White syndrome)    No prior ablation   Family History  Problem Relation Age of Onset   Lung cancer Mother    Cervical cancer Sister    Breast cancer Neg Hx    Pancreatic cancer Neg Hx    Colon cancer Neg Hx    Past Surgical History:  Procedure Laterality Date   CATARACT EXTRACTION Bilateral 2020   CYSTOSCOPY N/A 06/05/2021   Procedure: CYSTOSCOPY WITH SUPRA PUBIC TUBE PLACEMENT;  Surgeon: Heloise Purpura, MD;  Location: [facility];  Service: Urology;  Laterality: N/A;   CYSTOSCOPY WITH FULGERATION N/A 01/27/2022   Procedure: CYSTOSCOPY WITH FULGERATION WITH CLOT EVACUATION;  Surgeon: Crist Fat, MD;  Location: [facility];  Service: Urology;  Laterality: N/A;   CYSTOSCOPY WITH FULGERATION N/A 03/02/2022   Procedure: CYSTOSCOPY, CLOT EVACUTION WITH TRANSURETHERAL FULGERATION OF BLADDER;  Surgeon: Heloise Purpura, MD;  Location: [facility];  Service: Urology;  Laterality: N/A;   IR 3D INDEPENDENT WKST  01/17/2022   IR ANGIOGRAM PELVIS SELECTIVE OR SUPRASELECTIVE  01/17/2022   IR ANGIOGRAM SELECTIVE EACH ADDITIONAL VESSEL  01/17/2022   IR ANGIOGRAM SELECTIVE EACH ADDITIONAL VESSEL  01/17/2022   IR EMBO ART  VEN HEMORR LYMPH EXTRAV  INC GUIDE ROADMAPPING  01/17/2022   IR US GUIDE VASC ACCESS RIGHT  01/17/2022   LIPOMA EXCISION Left 05/16/2020   Procedure: EXCISION LIPOMA LEFT CHEST WALL;  Surgeon: Sheliah Hatch De Blanch, MD;  Location: [facility];  Service: General;  Laterality: Left;   LOWER EXTREMITY VENOGRAPHY Right 09/17/2022   Procedure: LOWER EXTREMITY VENOGRAPHY;  Surgeon: Maeola Harman, MD;  Location: [facility] INVASIVE CV LAB;  Service: Cardiovascular;  Laterality: Right;   PERIPHERAL VASCULAR INTERVENTION  09/17/2022   Procedure: PERIPHERAL VASCULAR INTERVENTION;  Surgeon: Maeola Harman, MD;  Location: [facility] INVASIVE CV LAB;  Service: Cardiovascular;;   RADIOACTIVE SEED IMPLANT  2020   TRANSURETHRAL RESECTION OF BLADDER TUMOR N/A 01/17/2022   Procedure: CYSTOSCOPY WITH CLOT EVACUATION, FULGERATION OF PROSTATE;  Surgeon: Jerilee Field, MD;  Location: [facility];  Service: Urology;  Laterality: N/A;   VENA CAVA FILTER PLACEMENT Right 12/10/2021   Procedure: INSERTION VENA-CAVA FILTER Right Femoral;  Surgeon: Maeola Harman, MD;  Location: [facility] OR;  Service: Vascular;  Laterality: Right;    Short Social History:  Social History   Tobacco Use   Smoking status:  Former    Packs/day: 0.50    Years: 60.00    Additional pack years: 0.00    Total pack years: 30.00    Types: Cigarettes    Quit date: 01/31/2019    Years since quitting: 3.7   Smokeless tobacco: Never  Substance Use Topics   Alcohol use: Not Currently    Comment: no alochol since 2020    No Known Allergies  Current Outpatient Medications  Medication Sig Dispense Refill   clopidogrel (PLAVIX) 75 MG tablet Take 1 tablet (75 mg total) by mouth daily. 30 tablet 2   ergocalciferol (VITAMIN D2) 1.25 MG (50000 UT) capsule Take 50,000 Units by mouth once a week.     ferrous sulfate 325 (65 FE) MG tablet Take 325 mg by mouth daily with breakfast.     finasteride (PROSCAR) 5 MG tablet Take 5 mg by mouth daily.     folic acid (FOLVITE) 1 MG tablet Take 1 mg by mouth daily.     gabapentin (NEURONTIN) 300 MG capsule Take 300 mg by mouth 3 (three) times daily.     lisinopril (ZESTRIL) 40 MG tablet Take 40 mg by mouth daily.     magnesium oxide (MAG-OX) 400 MG tablet Take 400 mg by mouth daily.      Menthol-Methyl Salicylate (MUSCLE RUB) 10-15 % CREA Apply 1 Application topically as needed for muscle pain.     metoprolol succinate (TOPROL-XL) 100 MG 24 hr tablet Take 100 mg by mouth daily.     Omega-3 Fatty Acids (FISH OIL) 1000 MG CAPS Take 1,000 mg by mouth daily.     OVER THE COUNTER MEDICATION Take 1 capsule by mouth daily as needed (Siatic Nerve pain). Sciatiease     oxyCODONE (OXY IR/ROXICODONE) 5 MG immediate release tablet Take 1 tablet (5 mg total) by mouth every 6 (six) hours as needed for moderate pain. 10 tablet 0   Prenatal Vit-Fe Fumarate-FA (PRENATAL MULTIVITAMIN) TABS tablet Take 1 tablet by mouth daily at 12 noon.     verapamil (VERELAN PM) 120 MG 24 hr capsule Take 120 mg by mouth 2 (two) times daily.     vitamin B-12 (CYANOCOBALAMIN) 1000 MCG tablet Take 1,000 mcg by mouth daily.     No current facility-administered medications for this visit.    Review of Systems   Constitutional:  Constitutional negative. HENT: HENT negative.  Eyes: Eyes negative.  Respiratory: Respiratory negative.  Cardiovascular: Cardiovascular negative.  GI: Gastrointestinal negative.  Musculoskeletal: Musculoskeletal negative.  Skin: Skin negative.  Neurological: Neurological negative. Hematologic: Hematologic/lymphatic negative.  Psychiatric: Psychiatric negative.        Objective:  Objective   Vitals:   10/17/22 0938  BP: (!) 144/77  Pulse: (!) 59  Resp: 20  Temp: 97.8 F (36.6 C)  SpO2: 96%  Weight: 175 lb (79.4 kg)  Height:  (1.854 m)   Body mass  index is 23.09 kg/m.  Physical Exam HENT:     Head: Normocephalic.     Mouth/Throat:     Mouth: Mucous membranes are moist.  Eyes:     Pupils: Pupils are equal, round, and reactive to light.  Cardiovascular:     Rate and Rhythm: Normal rate.     Pulses: Normal pulses.  Pulmonary:     Effort: Pulmonary effort is normal.  Abdominal:     General: Abdomen is flat.     Palpations: Abdomen is soft.  Musculoskeletal:     Right lower leg: No edema.     Left lower leg: No edema.     Comments: Both legs are the same size below the knee and soft above the knee  Skin:    General: Skin is warm.     Capillary Refill: Capillary refill takes less than 2 seconds.  Neurological:     Mental Status: He is alert.  Psychiatric:        Mood and Affect: Mood normal.        Thought Content: Thought content normal.        Judgment: Judgment normal.     Data: IVC/Iliac Findings:  +----------+------+--------+--------+    IVC    PatentThrombusComments  +----------+------+--------+--------+  IVC Mid   patent                  +----------+------+--------+--------+  IVC Distalpatent                  +----------+------+--------+--------+     +-------------------+---------+-----------+---------+-----------+--------+         CIV        RT-PatentRT-ThrombusLT-PatentLT-ThrombusComments   +-------------------+---------+-----------+---------+-----------+--------+  Common Iliac Prox   patent                                           +-------------------+---------+-----------+---------+-----------+--------+  Common Iliac Mid    patent                                           +-------------------+---------+-----------+---------+-----------+--------+  Common Iliac Distal patent                                           +-------------------+---------+-----------+---------+-----------+--------+      +-------------------------+---------+-----------+---------+-----------+----  ----+            EIV            RT-PatentRT-ThrombusLT-PatentLT-ThrombusComments  +-------------------------+---------+-----------+---------+-----------+----  ----+  External Iliac Vein Prox  patent                                            +-------------------------+---------+-----------+---------+-----------+----  ----+  External Iliac Vein Mid   patent                                            +-------------------------+---------+-----------+---------+-----------+----  ----+  External Iliac Vein       patent  Distal                                                                      +-------------------------+---------+-----------+---------+-----------+----  ----+           Summary:  IVC/Iliac: There is no evidence of thrombus involving the right common  iliac vein. There is no evidence of thrombus involving the right external  iliac vein. Widely patent right common iliac vein and external iliac vein  stenting.  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  Should be okay for the patient to take QOD Plavix and we will keep the filter indefinitely given that he is not a candidate for anticoagulation given his ongoing hematuria issues.  Plan will be to see him back in a few months with repeat studies unless he has issues before that time.     Maeola Harman MD [facility]

## 2022-10-29 ENCOUNTER — Other Ambulatory Visit: Payer: Self-pay

## 2022-10-29 DIAGNOSIS — I87001 Postthrombotic syndrome without complications of right lower extremity: Secondary | ICD-10-CM

## 2022-12-10 ENCOUNTER — Other Ambulatory Visit: Payer: Self-pay | Admitting: Physician Assistant

## 2023-02-15 ENCOUNTER — Inpatient Hospital Stay (HOSPITAL_COMMUNITY)
Admission: EM | Admit: 2023-02-15 | Discharge: 2023-02-23 | DRG: 988 | Disposition: A | Discharge status: Home / Self Care | Payer: Medicare HMO | Attending: Family Medicine | Admitting: Family Medicine

## 2023-02-15 ENCOUNTER — Encounter (HOSPITAL_COMMUNITY): Payer: Self-pay

## 2023-02-15 ENCOUNTER — Other Ambulatory Visit: Payer: Self-pay

## 2023-02-15 DIAGNOSIS — D62 Acute posthemorrhagic anemia: Secondary | ICD-10-CM | POA: Diagnosis not present

## 2023-02-15 DIAGNOSIS — R310 Gross hematuria: Secondary | ICD-10-CM | POA: Diagnosis present

## 2023-02-15 DIAGNOSIS — N390 Urinary tract infection, site not specified: Secondary | ICD-10-CM | POA: Diagnosis present

## 2023-02-15 DIAGNOSIS — Z95820 Peripheral vascular angioplasty status with implants and grafts: Secondary | ICD-10-CM

## 2023-02-15 DIAGNOSIS — B965 Pseudomonas (aeruginosa) (mallei) (pseudomallei) as the cause of diseases classified elsewhere: Secondary | ICD-10-CM | POA: Diagnosis present

## 2023-02-15 DIAGNOSIS — Y842 Radiological procedure and radiotherapy as the cause of abnormal reaction of the patient, or of later complication, without mention of misadventure at the time of the procedure: Secondary | ICD-10-CM | POA: Diagnosis present

## 2023-02-15 DIAGNOSIS — I10 Essential (primary) hypertension: Secondary | ICD-10-CM | POA: Diagnosis present

## 2023-02-15 DIAGNOSIS — Z7901 Long term (current) use of anticoagulants: Secondary | ICD-10-CM

## 2023-02-15 DIAGNOSIS — Z9842 Cataract extraction status, left eye: Secondary | ICD-10-CM

## 2023-02-15 DIAGNOSIS — Z79899 Other long term (current) drug therapy: Secondary | ICD-10-CM

## 2023-02-15 DIAGNOSIS — B9689 Other specified bacterial agents as the cause of diseases classified elsewhere: Secondary | ICD-10-CM | POA: Diagnosis present

## 2023-02-15 DIAGNOSIS — Z8546 Personal history of malignant neoplasm of prostate: Secondary | ICD-10-CM

## 2023-02-15 DIAGNOSIS — Z906 Acquired absence of other parts of urinary tract: Secondary | ICD-10-CM

## 2023-02-15 DIAGNOSIS — I480 Paroxysmal atrial fibrillation: Secondary | ICD-10-CM | POA: Diagnosis present

## 2023-02-15 DIAGNOSIS — T83518A Infection and inflammatory reaction due to other urinary catheter, initial encounter: Principal | ICD-10-CM | POA: Diagnosis present

## 2023-02-15 DIAGNOSIS — Z8049 Family history of malignant neoplasm of other genital organs: Secondary | ICD-10-CM

## 2023-02-15 DIAGNOSIS — Z801 Family history of malignant neoplasm of trachea, bronchus and lung: Secondary | ICD-10-CM

## 2023-02-15 DIAGNOSIS — Z923 Personal history of irradiation: Secondary | ICD-10-CM

## 2023-02-15 DIAGNOSIS — Z9841 Cataract extraction status, right eye: Secondary | ICD-10-CM

## 2023-02-15 DIAGNOSIS — D649 Anemia, unspecified: Principal | ICD-10-CM

## 2023-02-15 DIAGNOSIS — Y846 Urinary catheterization as the cause of abnormal reaction of the patient, or of later complication, without mention of misadventure at the time of the procedure: Secondary | ICD-10-CM | POA: Diagnosis present

## 2023-02-15 DIAGNOSIS — Z1619 Resistance to other specified beta lactam antibiotics: Secondary | ICD-10-CM | POA: Diagnosis present

## 2023-02-15 DIAGNOSIS — N400 Enlarged prostate without lower urinary tract symptoms: Secondary | ICD-10-CM | POA: Diagnosis present

## 2023-02-15 DIAGNOSIS — I87001 Postthrombotic syndrome without complications of right lower extremity: Secondary | ICD-10-CM | POA: Diagnosis present

## 2023-02-15 DIAGNOSIS — Z7902 Long term (current) use of antithrombotics/antiplatelets: Secondary | ICD-10-CM

## 2023-02-15 DIAGNOSIS — Z9359 Other cystostomy status: Secondary | ICD-10-CM

## 2023-02-15 DIAGNOSIS — Z87891 Personal history of nicotine dependence: Secondary | ICD-10-CM

## 2023-02-15 DIAGNOSIS — C61 Malignant neoplasm of prostate: Secondary | ICD-10-CM | POA: Diagnosis present

## 2023-02-15 DIAGNOSIS — Z8701 Personal history of pneumonia (recurrent): Secondary | ICD-10-CM

## 2023-02-15 DIAGNOSIS — Z86718 Personal history of other venous thrombosis and embolism: Secondary | ICD-10-CM

## 2023-02-15 DIAGNOSIS — Z9889 Other specified postprocedural states: Secondary | ICD-10-CM

## 2023-02-15 DIAGNOSIS — B9620 Unspecified Escherichia coli [E. coli] as the cause of diseases classified elsewhere: Secondary | ICD-10-CM | POA: Diagnosis present

## 2023-02-15 DIAGNOSIS — E119 Type 2 diabetes mellitus without complications: Secondary | ICD-10-CM | POA: Diagnosis present

## 2023-02-15 DIAGNOSIS — I456 Pre-excitation syndrome: Secondary | ICD-10-CM | POA: Diagnosis present

## 2023-02-15 LAB — COMPREHENSIVE METABOLIC PANEL
ALT: 12 U/L (ref 0–44)
AST: 14 U/L — ABNORMAL LOW (ref 15–41)
Albumin: 3.5 g/dL (ref 3.5–5.0)
Alkaline Phosphatase: 54 U/L (ref 38–126)
Anion gap: 6 (ref 5–15)
BUN: 12 mg/dL (ref 8–23)
CO2: 25 mmol/L (ref 22–32)
Calcium: 8.6 mg/dL — ABNORMAL LOW (ref 8.9–10.3)
Chloride: 107 mmol/L (ref 98–111)
Creatinine, Ser: 1.2 mg/dL (ref 0.61–1.24)
GFR, Estimated: >60 mL/min (ref >60)
Glucose, Bld: 95 mg/dL (ref 70–99)
Potassium: 3.8 mmol/L (ref 3.5–5.1)
Sodium: 138 mmol/L (ref 135–145)
Total Bilirubin: 0.4 mg/dL (ref 0.3–1.2)
Total Protein: 6.6 g/dL (ref 6.5–8.1)

## 2023-02-15 LAB — CBC WITH DIFFERENTIAL/PLATELET
Abs Immature Granulocytes: 0.05 10*3/uL (ref 0.00–0.07)
Basophils Absolute: 0 10*3/uL (ref 0.0–0.1)
Basophils Relative: 0 %
Eosinophils Absolute: 0.2 10*3/uL (ref 0.0–0.5)
Eosinophils Relative: 3 %
HCT: 21.9 % — ABNORMAL LOW (ref 39.0–52.0)
Hemoglobin: 6.5 g/dL — CL (ref 13.0–17.0)
Immature Granulocytes: 1 %
Lymphocytes Relative: 13 %
Lymphs Abs: 0.9 10*3/uL (ref 0.7–4.0)
MCH: 28.8 pg (ref 26.0–34.0)
MCHC: 29.7 g/dL — ABNORMAL LOW (ref 30.0–36.0)
MCV: 96.9 fL (ref 80.0–100.0)
Monocytes Absolute: 0.7 10*3/uL (ref 0.1–1.0)
Monocytes Relative: 11 %
Neutro Abs: 4.6 10*3/uL (ref 1.7–7.7)
Neutrophils Relative %: 72 %
Platelets: 293 10*3/uL (ref 150–400)
RBC: 2.26 MIL/uL — ABNORMAL LOW (ref 4.22–5.81)
RDW: 16 % — ABNORMAL HIGH (ref 11.5–15.5)
WBC: 6.4 10*3/uL (ref 4.0–10.5)
nRBC: 1.4 % — ABNORMAL HIGH (ref 0.0–0.2)

## 2023-02-15 LAB — URINALYSIS, W/ REFLEX TO CULTURE (INFECTION SUSPECTED)
RBC / HPF: >50 RBC/hpf (ref 0–5)
WBC, UA: >50 WBC/hpf (ref 0–5)

## 2023-02-15 LAB — PREPARE RBC (CROSSMATCH)

## 2023-02-15 LAB — PROTIME-INR
INR: 1.1 (ref 0.8–1.2)
Prothrombin Time: 14.7 seconds (ref 11.4–15.2)

## 2023-02-15 LAB — PSA: Prostatic Specific Antigen: 0.21 ng/mL (ref 0.00–4.00)

## 2023-02-15 LAB — HEMOGLOBIN A1C
Hgb A1c MFr Bld: 5 % (ref 4.8–5.6)
Mean Plasma Glucose: 96.8 mg/dL

## 2023-02-15 MED ORDER — FERROUS SULFATE 325 (65 FE) MG PO TABS
325.0000 mg | ORAL_TABLET | Freq: Every day | ORAL | Status: DC
  Administered 2023-02-16 – 2023-02-23 (×8): 325 mg via ORAL
  Filled 2023-02-15 (×8): qty 1

## 2023-02-15 MED ORDER — SODIUM CHLORIDE 0.9% IV SOLUTION: Freq: Once | INTRAVENOUS | Status: AC

## 2023-02-15 MED ORDER — ACETAMINOPHEN 650 MG RE SUPP: 650.0000 mg | Freq: Four times a day (QID) | RECTAL | Status: DC | PRN

## 2023-02-15 MED ORDER — ONDANSETRON HCL 4 MG/2ML IJ SOLN: 4.0000 mg | Freq: Four times a day (QID) | INTRAMUSCULAR | Status: DC | PRN

## 2023-02-15 MED ORDER — ACETAMINOPHEN 325 MG PO TABS
650.0000 mg | ORAL_TABLET | Freq: Four times a day (QID) | ORAL | Status: DC | PRN
  Administered 2023-02-18 – 2023-02-19 (×2): 650 mg via ORAL
  Filled 2023-02-15 (×2): qty 2

## 2023-02-15 MED ORDER — ONDANSETRON HCL 4 MG PO TABS: 4.0000 mg | ORAL_TABLET | Freq: Four times a day (QID) | ORAL | Status: DC | PRN

## 2023-02-15 MED ORDER — OXYCODONE HCL 5 MG PO TABS: 5.0000 mg | ORAL_TABLET | Freq: Four times a day (QID) | ORAL | Status: DC | PRN

## 2023-02-15 MED ORDER — SULFAMETHOXAZOLE-TRIMETHOPRIM 400-80 MG PO TABS
1.0000 | ORAL_TABLET | Freq: Once | ORAL | Status: AC
  Administered 2023-02-16: 1 via ORAL
  Filled 2023-02-15: qty 1

## 2023-02-15 MED ORDER — GABAPENTIN 300 MG PO CAPS
300.0000 mg | ORAL_CAPSULE | Freq: Three times a day (TID) | ORAL | Status: DC
  Administered 2023-02-15 – 2023-02-23 (×24): 300 mg via ORAL
  Filled 2023-02-15 (×24): qty 1

## 2023-02-15 MED ORDER — TRAZODONE HCL 50 MG PO TABS
25.0000 mg | ORAL_TABLET | Freq: Every evening | ORAL | Status: DC | PRN
  Administered 2023-02-19: 25 mg via ORAL
  Filled 2023-02-15: qty 1

## 2023-02-15 MED ORDER — ALBUTEROL SULFATE (2.5 MG/3ML) 0.083% IN NEBU: 2.5000 mg | INHALATION_SOLUTION | RESPIRATORY_TRACT | Status: DC | PRN

## 2023-02-15 NOTE — H&P (Signed)
History and Physical  Nathan [name] ZOX:[id number] DOB: Jun 19, 1946 DOA: 02/15/2023  PCP: Dois Davenport, MD   Chief Complaint: Blood in urine, weakness dizziness  HPI: Nathan [name] is a 77 y.o. male with medical history significant for history of prostate cancer, hypertension, diabetes, history of DVT who underwent right common and external iliac vein stenting due to post-thrombotic syndrome 08/2022 and has since been on every other day Plavix due to issues with gross hematuria.  History is provided by the patient as well as his daughter who takes very close care of him.  They state that for the last 4 months, he has not had any bleeding issues, has been getting his suprapubic catheter exchanged every month.  A couple of weeks ago, it was changed out by an nurse other than the one who typically does the procedure, and he immediately started having gross hematuria thereafter.  There has been some clots, which they have been able to irrigate and keep urine flowing.  Has not had any retention issues.  Has some slight low back pain which is new for him, but otherwise no new symptoms and specifically denies any fevers, chills, nausea, vomiting, abdominal pain.  Came to the ER for evaluation today due to started to feel weak and intermittently dizzy for the last couple of days.  ED Course: In the emergency department, he has been afebrile, initial blood pressure was acceptable, but repeat demonstrates blood pressure 108/70.  Lab work was done in the ER today, shows a hemoglobin of 6.5, otherwise unremarkable.  His baseline hemoglobin is about 12.  Review of Systems: Please see HPI for pertinent positives and negatives. A complete 10 system review of systems are otherwise negative.  Past Medical History:  Diagnosis Date   Anginal pain (HCC)    Atrial fibrillation (HCC) 01/2020   resolved now    Cancer of prostate (HCC) 2020   Chronic anticoagulation    Diabetes mellitus without complication (HCC)     type 2 diet controlled   Dysrhythmia    a fib   Foley catheter in place    will be changed 05-16-20   HTN (hypertension)    Lipoma of chest wall    Pneumonia 01/2020   and uti in hospital for 1 week   WPW (Wolff-Parkinson-White syndrome)    No prior ablation   Past Surgical History:  Procedure Laterality Date   CATARACT EXTRACTION Bilateral 2020   CYSTOSCOPY N/A 06/05/2021   Procedure: CYSTOSCOPY WITH SUPRA PUBIC TUBE PLACEMENT;  Surgeon: Heloise Purpura, MD;  Location: [facility];  Service: Urology;  Laterality: N/A;   CYSTOSCOPY WITH FULGERATION N/A 01/27/2022   Procedure: CYSTOSCOPY WITH FULGERATION WITH CLOT EVACUATION;  Surgeon: Crist Fat, MD;  Location: [facility];  Service: Urology;  Laterality: N/A;   CYSTOSCOPY WITH FULGERATION N/A 03/02/2022   Procedure: CYSTOSCOPY, CLOT EVACUTION WITH TRANSURETHERAL FULGERATION OF BLADDER;  Surgeon: Heloise Purpura, MD;  Location: [facility];  Service: Urology;  Laterality: N/A;   IR 3D INDEPENDENT WKST  01/17/2022   IR ANGIOGRAM PELVIS SELECTIVE OR SUPRASELECTIVE  01/17/2022   IR ANGIOGRAM SELECTIVE EACH ADDITIONAL VESSEL  01/17/2022   IR ANGIOGRAM SELECTIVE EACH ADDITIONAL VESSEL  01/17/2022   IR EMBO ART  VEN HEMORR LYMPH EXTRAV  INC GUIDE ROADMAPPING  01/17/2022   IR US GUIDE VASC ACCESS RIGHT  01/17/2022   LIPOMA EXCISION Left 05/16/2020   Procedure: EXCISION LIPOMA LEFT CHEST WALL;  Surgeon: Sheliah Hatch De Blanch, MD;  Location: [facility];  Service: General;  Laterality: Left;   LOWER EXTREMITY VENOGRAPHY Right 09/17/2022   Procedure: LOWER EXTREMITY VENOGRAPHY;  Surgeon: Maeola Harman, MD;  Location: [facility] INVASIVE CV LAB;  Service: Cardiovascular;  Laterality: Right;   PERIPHERAL VASCULAR INTERVENTION  09/17/2022   Procedure: PERIPHERAL VASCULAR INTERVENTION;  Surgeon: Maeola Harman, MD;  Location: [facility] INVASIVE CV LAB;  Service: Cardiovascular;;   RADIOACTIVE SEED IMPLANT  2020   TRANSURETHRAL RESECTION OF  BLADDER TUMOR N/A 01/17/2022   Procedure: CYSTOSCOPY WITH CLOT EVACUATION, FULGERATION OF PROSTATE;  Surgeon: Jerilee Field, MD;  Location: [facility];  Service: Urology;  Laterality: N/A;   VENA CAVA FILTER PLACEMENT Right 12/10/2021   Procedure: INSERTION VENA-CAVA FILTER Right Femoral;  Surgeon: Maeola Harman, MD;  Location: [facility] OR;  Service: Vascular;  Laterality: Right;    Social History:  reports that he quit smoking about 4 years ago. His smoking use included cigarettes. He started smoking about 64 years ago. He has a 30 pack-year smoking history. He has never used smokeless tobacco. He reports that he does not currently use alcohol. He reports that he does not use drugs.   No Known Allergies  Family History  Problem Relation Age of Onset   Lung cancer Mother    Cervical cancer Sister    Breast cancer Neg Hx    Pancreatic cancer Neg Hx    Colon cancer Neg Hx      Prior to Admission medications   Medication Sig Start Date End Date Taking? Authorizing Provider  clopidogrel (PLAVIX) 75 MG tablet Take 1 tablet by mouth once daily 12/18/22   Maeola Harman, MD  ergocalciferol (VITAMIN D2) 1.25 MG (50000 UT) capsule Take 50,000 Units by mouth once a week.    [provider]  ferrous sulfate 325 (65 FE) MG tablet Take 325 mg by mouth daily with breakfast.    [provider]  finasteride (PROSCAR) 5 MG tablet Take 5 mg by mouth daily.    [provider]  folic acid (FOLVITE) 1 MG tablet Take 1 mg by mouth daily. 03/24/19   [provider]  gabapentin (NEURONTIN) 300 MG capsule Take 300 mg by mouth 3 (three) times daily. 10/27/21   [provider]  lisinopril (ZESTRIL) 40 MG tablet Take 40 mg by mouth daily.    [provider]  magnesium oxide (MAG-OX) 400 MG tablet Take 400 mg by mouth daily.  03/24/19   [provider]  Menthol-Methyl Salicylate (MUSCLE RUB) 10-15 % CREA Apply 1 Application topically as  needed for muscle pain.    [provider]  metoprolol succinate (TOPROL-XL) 100 MG 24 hr tablet Take 100 mg by mouth daily. 11/14/21   [provider]  Omega-3 Fatty Acids (FISH OIL) 1000 MG CAPS Take 1,000 mg by mouth daily.    [provider]  OVER THE COUNTER MEDICATION Take 1 capsule by mouth daily as needed (Siatic Nerve pain). Sciatiease    [provider]  oxyCODONE (OXY IR/ROXICODONE) 5 MG immediate release tablet Take 1 tablet (5 mg total) by mouth every 6 (six) hours as needed for moderate pain. 09/18/22   Emilie Rutter, PA-C  Prenatal Vit-Fe Fumarate-FA (PRENATAL MULTIVITAMIN) TABS tablet Take 1 tablet by mouth daily at 12 noon.    [provider]  verapamil (VERELAN PM) 120 MG 24 hr capsule Take 120 mg by mouth 2 (two) times daily. 08/07/22   [provider]  vitamin B-12 (CYANOCOBALAMIN) 1000 MCG tablet Take 1,000 mcg  by mouth daily.    [provider]    Physical Exam: BP 108/70   Pulse 80   Temp 98.2 F (36.8 C)   Resp 18   Ht 6\' 1"  (1.854 m)   Wt 77.1 kg   SpO2 100%   BMI 22.43 kg/m   General:  Alert, oriented, calm, in no acute distress, daughter at bedside Eyes: EOMI, clear conjuctivae, white sclerea Neck: supple, no masses, trachea mildline  Cardiovascular: RRR, no murmurs or rubs, no peripheral edema  Respiratory: clear to auscultation bilaterally, no wheezes, no crackles  Abdomen: soft, nontender, nondistended, normal bowel tones heard GU: Suprapubic catheter in place, leg bag with frankly dark bloody urine Skin: dry, no rashes  Musculoskeletal: no joint effusions, normal range of motion  Psychiatric: appropriate affect, normal speech  Neurologic: extraocular muscles intact, clear speech, moving all extremities with intact sensorium          Labs on Admission:  Basic Metabolic Panel: Recent Labs  Lab 02/15/23 0919  NA 138  K 3.8  CL 107  CO2 25  GLUCOSE 95  BUN 12  CREATININE 1.20   CALCIUM 8.6*   Liver Function Tests: Recent Labs  Lab 02/15/23 0919  AST 14*  ALT 12  ALKPHOS 54  BILITOT 0.4  PROT 6.6  ALBUMIN 3.5   No results for input(s): "LIPASE", "AMYLASE" in the last 168 hours. No results for input(s): "AMMONIA" in the last 168 hours. CBC: Recent Labs  Lab 02/15/23 0919  WBC 6.4  NEUTROABS 4.6  HGB 6.5*  HCT 21.9*  MCV 96.9  PLT 293   Cardiac Enzymes: No results for input(s): "CKTOTAL", "CKMB", "CKMBINDEX", "TROPONINI" in the last 168 hours.  BNP (last 3 results) No results for input(s): "BNP" in the last 8760 hours.  ProBNP (last 3 results) No results for input(s): "PROBNP" in the last 8760 hours.  CBG: No results for input(s): "GLUCAP" in the last 168 hours.  Radiological Exams on Admission: No results found.  Assessment/Plan This is a pleasant 77 year old gentleman with a history of well-controlled diabetes, hypertension, prior prostate cancer with chronic suprapubic catheter on every other day Plavix due to right iliac stent being admitted to the hospital with continued gross hematuria and resulting acute blood loss anemia.  Acute blood loss anemia-due to gross hematuria after most recent suprapubic catheter exchange.  Note normal INR. -Observation admission -Transfuse 2 units of blood  Gross hematuria-has been a chronic problem for him in the past, but has not recurred in the last 4 months.  Unfortunately recurred after most recent catheter exchange. -Avoid blood thinners as able -Has been seen by urology  Hypertension-currently hypotensive, presumably due to his anemia -Will hold home antihypertensives  Type 2 diabetes-last hemoglobin A1c 5.1 in 2023 -Regular diet -Update A1c  History of post thrombotic syndrome and right iliac stent -Discussed with his vascular surgeon Dr. Randie Heinz, okay to hold Plavix  DVT prophylaxis: SCDs only    Code Status: Full Code  Consults called: Urology has seen  Admission status:  Observation  Time spent: 46 minutes  Yocelin Vanlue Sharlette Dense MD [facility] Pager [phone]  If 7PM-7AM, please contact night-coverage [email or web address] Password [facility]  02/15/2023, 12:39 PM

## 2023-02-15 NOTE — Progress Notes (Signed)
Referring Physician(s): Sattenfield,C/Wrenn,J  Supervising Physician: Malachy Moan  Patient Status:  [name] - In-pt  Chief Complaint: Hematuria, weakness/dizziness   Subjective: Patient known to IR team from left prostatic artery microparticle embolization for refractory hematuria on 01/17/22.  At the time there was vasospasm of the common origin of the right prostatic and obturator arteries precluding embolization.  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  He presented to [facility] long ED today with persistent hematuria along with weakness/dizziness.  Labs included WBC 6.4, hemoglobin 6.5, platelets normal, creatinine 1.2, PT/INR normal, urine culture pending. He is afebrile, BP okay. He is currently receiving blood transfusion. Last plavix dose was yesterday. He currently denies fever,HA,CP,dyspnea, cough, abd pain/back pain,N/V. Request now received from urology for consideration of repeat prostate artery embolization to hopefully include rt lobe and recheck left lobe.       Past Medical History:  Diagnosis Date   Anginal pain (HCC)    Atrial fibrillation (HCC) 01/2020   resolved now    Cancer of prostate (HCC) 2020   Chronic anticoagulation    Diabetes mellitus without complication (HCC)    type 2 diet controlled   Dysrhythmia    a fib   Foley catheter in place    will be changed 05-16-20   HTN (hypertension)    Lipoma of chest wall    Pneumonia 01/2020   and uti in hospital for 1 week   WPW (Wolff-Parkinson-White syndrome)    No prior ablation   Past Surgical History:  Procedure Laterality Date    CATARACT EXTRACTION Bilateral 2020   CYSTOSCOPY N/A 06/05/2021   Procedure: CYSTOSCOPY WITH SUPRA PUBIC TUBE PLACEMENT;  Surgeon: Heloise Purpura, MD;  Location: [facility];  Service: Urology;  Laterality: N/A;   CYSTOSCOPY WITH FULGERATION N/A 01/27/2022   Procedure: CYSTOSCOPY WITH FULGERATION WITH CLOT EVACUATION;  Surgeon: Crist Fat, MD;  Location: [facility];  Service: Urology;  Laterality: N/A;   CYSTOSCOPY WITH FULGERATION N/A 03/02/2022   Procedure: CYSTOSCOPY, CLOT EVACUTION WITH TRANSURETHERAL FULGERATION OF BLADDER;  Surgeon: Heloise Purpura, MD;  Location: [facility];  Service: Urology;  Laterality: N/A;   IR 3D INDEPENDENT WKST  01/17/2022   IR ANGIOGRAM PELVIS SELECTIVE OR SUPRASELECTIVE  01/17/2022   IR ANGIOGRAM SELECTIVE EACH ADDITIONAL VESSEL  01/17/2022   IR ANGIOGRAM SELECTIVE EACH ADDITIONAL VESSEL  01/17/2022   IR EMBO ART  VEN HEMORR LYMPH EXTRAV  INC GUIDE ROADMAPPING  01/17/2022   IR US GUIDE VASC ACCESS RIGHT  01/17/2022   LIPOMA EXCISION Left 05/16/2020   Procedure: EXCISION LIPOMA LEFT CHEST WALL;  Surgeon: Sheliah Hatch De Blanch, MD;  Location[facility];  Service: General;  Laterality: Left;   LOWER EXTREMITY VENOGRAPHY Right 09/17/2022   Procedure: LOWER EXTREMITY VENOGRAPHY;  Surgeon: Maeola Harman, MD;  Location: [facility] INVASIVE CV LAB;  Service: Cardiovascular;  Laterality: Right;   PERIPHERAL VASCULAR INTERVENTION  09/17/2022   Procedure: PERIPHERAL VASCULAR INTERVENTION;  Surgeon: Maeola Harman, MD;  Location: [facility] INVASIVE CV LAB;  Service: Cardiovascular;;   RADIOACTIVE SEED IMPLANT  2020   TRANSURETHRAL RESECTION OF BLADDER TUMOR N/A 01/17/2022   Procedure: CYSTOSCOPY WITH  CLOT EVACUATION, FULGERATION OF PROSTATE;  Surgeon: Jerilee Field, MD;  Location: [facility];  Service: Urology;  Laterality: N/A;   VENA CAVA FILTER PLACEMENT Right 12/10/2021   Procedure: INSERTION VENA-CAVA FILTER Right Femoral;  Surgeon: Maeola Harman,  MD;  Location: [facility] OR;  Service: Vascular;  Laterality: Right;     Allergies: Patient has no known allergies.  Medications: Prior to Admission medications   Medication Sig Start Date End Date Taking? Authorizing Provider  clopidogrel (PLAVIX) 75 MG tablet Take 1 tablet by mouth once daily 12/18/22   Maeola Harman, MD  ergocalciferol (VITAMIN D2) 1.25 MG (50000 UT) capsule Take 50,000 Units by mouth once a week.    [provider]  ferrous sulfate 325 (65 FE) MG tablet Take 325 mg by mouth daily with breakfast.    [provider]  finasteride (PROSCAR) 5 MG tablet Take 5 mg by mouth daily.    [provider]  folic acid (FOLVITE) 1 MG tablet Take 1 mg by mouth daily. 03/24/19   [provider]  gabapentin (NEURONTIN) 300 MG capsule Take 300 mg by mouth 3 (three) times daily. 10/27/21   [provider]  lisinopril (ZESTRIL) 40 MG tablet Take 40 mg by mouth daily.    [provider]  magnesium oxide (MAG-OX) 400 MG tablet Take 400 mg by mouth daily.  03/24/19   [provider]  Menthol-Methyl Salicylate (MUSCLE RUB) 10-15 % CREA Apply 1 Application topically as needed for muscle pain.    [provider]  metoprolol succinate (TOPROL-XL) 100 MG 24 hr tablet Take 100 mg by mouth daily. 11/14/21   [provider]  Omega-3 Fatty Acids (FISH OIL) 1000 MG CAPS Take 1,000 mg by mouth daily.    [provider]  OVER THE COUNTER MEDICATION Take 1 capsule by mouth daily as needed (Siatic Nerve pain). Sciatiease    [provider]  oxyCODONE (OXY IR/ROXICODONE) 5 MG immediate release tablet Take 1 tablet (5 mg total) by mouth every 6 (six) hours as needed for moderate pain. 09/18/22   Emilie Rutter, PA-C  Prenatal Vit-Fe Fumarate-FA (PRENATAL MULTIVITAMIN) TABS tablet Take 1 tablet by mouth daily at 12 noon.    [provider]  verapamil (VERELAN PM) 120 MG 24 hr capsule Take 120 mg by mouth  2 (two) times daily. 08/07/22   [provider]  vitamin B-12 (CYANOCOBALAMIN) 1000 MCG tablet Take 1,000 mcg by mouth daily.    [provider]     Vital Signs: BP 128/72 (BP Location: Left Arm)   Pulse 87   Temp 98.7 F (37.1 C) (Oral)   Resp 18   Ht 6\' 1"  (1.854 m)   Wt 170 lb (77.1 kg)   SpO2 100%   BMI 22.43 kg/m   Physical Exam: awake/alert; chest- CTA bilat; heart- RRR; abd-soft,+BS,NT; no sig LE edema; SP cath in place draining bloody urine, insertion site ok.  Imaging: No results found.  Labs:  CBC: Recent Labs    09/17/22 1618 09/18/22 0219 09/29/22 1800 02/15/23 0919  WBC 5.5 4.8 4.2 6.4  HGB 12.8* 11.6* 11.9* 6.5*  HCT 40.8 35.8* 38.0* 21.9*  PLT 211 189 219 293    COAGS: Recent Labs    02/27/22 1420 09/29/22 1800 02/15/23 0919  INR 1.2 1.2 1.1  APTT 29  --   --     BMP: Recent Labs    09/04/22 1417 09/17/22 1618 09/18/22 0219 09/29/22 1800 02/15/23 0919  NA  138  --  138 136 138  K 3.7  --  3.8 3.3* 3.8  CL 107  --  107 105 107  CO2 27  --  23 24 25   GLUCOSE 91  --  110* 87 95  BUN 13  --  11 13 12   CALCIUM 9.0  --  8.7* 8.7* 8.6*  CREATININE 1.04 1.16 1.05 0.96 1.20  GFRNONAA >60 >60 >60 >60 >60    LIVER FUNCTION TESTS: Recent Labs    02/27/22 1420 09/18/22 0219 09/29/22 1800 02/15/23 0919  BILITOT 0.5 0.7 0.7 0.4  AST 33 17 16 14*  ALT 13 11 20 12   ALKPHOS 51 51 61 54  PROT 6.3* 5.9* 6.9 6.6  ALBUMIN 3.1* 3.1* 3.6 3.5    Assessment and Plan: 77 year old male with past medical history significant for angina, paroxysmal atrial fibrillation, diabetes, ABLA, HTN, WPW, BPH, prostate cancer with prior radiation therapy 2021 , radiation cystitis and chronic suprapubic tube,  DVT with right common and external iliac vein 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  S/p left prostatic artery microparticle embolization for refractory  hematuria on 01/17/22 by Dr. Milford Cage.  At the time there was vasospasm of the common origin of the right prostatic and obturator arteries precluding embolization.  He presented to [facility] ED today with persistent hematuria along with weakness/dizziness.  Labs included WBC 6.4, hemoglobin 6.5, platelets normal, creatinine 1.2, PT/INR normal, urine culture pending.He is afebrile, BP okay. He is currently receiving blood transfusion. Last plavix dose was yesterday. He currently denies fever,HA,CP,dyspnea, cough, abd pain/back pain,N/V. Request now received from urology for consideration of repeat prostate artery embolization to hopefully include rt lobe and recheck left lobe. Case has been reviewed by Drs. Suttle/McCullough. Plans are for PAE on 8/17. Risks and benefits of procedure were discussed with the patient/daughter Lyna Poser including, but not limited to bleeding, infection, vascular injury or contrast induced renal failure.  This interventional procedure involves the use of X-rays and because of the nature of the planned procedure, it is possible that we will have prolonged use of X-ray fluoroscopy.  Potential radiation risks to you include (but are not limited to) the following: - A slightly elevated risk for cancer  several years later in life. This risk is typically less than 0.5% percent. This risk is low in comparison to the normal incidence of human cancer, which is 33% for women and 50% for men according to the [employer] Society. - Radiation induced injury can include skin redness, resembling a rash, tissue breakdown / ulcers and hair loss (which can be temporary or permanent).   The likelihood of either of these occurring depends on the difficulty of the procedure and whether you are sensitive to radiation due to previous procedures, disease, or genetic conditions.   IF your procedure requires a prolonged use of radiation, you will be notified and given written instructions  for further action.  It is your responsibility to monitor the irradiated area for the 2 weeks following the procedure and to notify your physician if you are concerned that you have suffered a radiation induced injury.    All of the patient's questions were answered, patient is agreeable to proceed.  Consent signed and in chart.  Check am labs    Electronically Signed: D. Jeananne Rama, PA-C 02/15/2023, 3:40 PM   I spent a total of 25 minutes at the  the patient's bedside AND on the patient's hospital floor or unit, greater than 50% of which was counseling/coordinating care for arteriogram with prostate artery embolization    Patient ID: [name], male   DOB: Sep 15, 1945, 77 y.o.   MRN: [id number]

## 2023-02-15 NOTE — ED Provider Notes (Signed)
[place] EMERGENCY DEPARTMENT AT [facility] Provider Note   CSN: [id number] Arrival date & time: 02/15/23  0845     History  Chief Complaint  Patient presents with   Fatigue   Dizziness    Bleeding from suprapubic cath    Dilson Mally is a 77 y.o. male.  HPI Multiple daughter who assists with the history. Patient has a history of prostate cancer, now status post therapy.  He has an indwelling suprapubic catheter for about 4 years.  Last changed 2 weeks ago.  Today he presents with concern for dizziness, lightheadedness, no syncope, fall.  In addition, patient has had persistent bleeding through the catheter and spite of irrigation.  He saw his physician 4 days ago, scheduled to follow-up next week.  However, with new dizziness, he presents for evaluation.    Home Medications Prior to Admission medications   Medication Sig Start Date End Date Taking? Authorizing Provider  clopidogrel (PLAVIX) 75 MG tablet Take 1 tablet by mouth once daily 12/18/22   Maeola Harman, MD  ergocalciferol (VITAMIN D2) 1.25 MG (50000 UT) capsule Take 50,000 Units by mouth once a week.    [provider]  ferrous sulfate 325 (65 FE) MG tablet Take 325 mg by mouth daily with breakfast.    [provider]  finasteride (PROSCAR) 5 MG tablet Take 5 mg by mouth daily.    [provider]  folic acid (FOLVITE) 1 MG tablet Take 1 mg by mouth daily. 03/24/19   [provider]  gabapentin (NEURONTIN) 300 MG capsule Take 300 mg by mouth 3 (three) times daily. 10/27/21   [provider]  lisinopril (ZESTRIL) 40 MG tablet Take 40 mg by mouth daily.    [provider]  magnesium oxide (MAG-OX) 400 MG tablet Take 400 mg by mouth daily.  03/24/19   [provider]  Menthol-Methyl Salicylate (MUSCLE RUB) 10-15 % CREA Apply 1 Application topically as needed for muscle pain.    [provider]  metoprolol succinate (TOPROL-XL) 100  MG 24 hr tablet Take 100 mg by mouth daily. 11/14/21   [provider]  Omega-3 Fatty Acids (FISH OIL) 1000 MG CAPS Take 1,000 mg by mouth daily.    [provider]  OVER THE COUNTER MEDICATION Take 1 capsule by mouth daily as needed (Siatic Nerve pain). Sciatiease    [provider]  oxyCODONE (OXY IR/ROXICODONE) 5 MG immediate release tablet Take 1 tablet (5 mg total) by mouth every 6 (six) hours as needed for moderate pain. 09/18/22   Emilie Rutter, PA-C  Prenatal Vit-Fe Fumarate-FA (PRENATAL MULTIVITAMIN) TABS tablet Take 1 tablet by mouth daily at 12 noon.    [provider]  verapamil (VERELAN PM) 120 MG 24 hr capsule Take 120 mg by mouth 2 (two) times daily. 08/07/22   [provider]  vitamin B-12 (CYANOCOBALAMIN) 1000 MCG tablet Take 1,000 mcg by mouth daily.    [provider]      Allergies    Patient has no known allergies.    Review of Systems   Review of Systems  All other systems reviewed and are negative.   Physical Exam Updated Vital Signs BP 108/70   Pulse 80   Temp 98.2 F (36.8 C)   Resp 18   Ht 6\' 1"  (1.854 m)   Wt 77.1 kg   SpO2 100%   BMI 22.43 kg/m  Physical Exam Vitals and nursing note reviewed.  Constitutional:  General: He is not in acute distress.    Appearance: He is well-developed.  HENT:     Head: Normocephalic and atraumatic.  Eyes:     Conjunctiva/sclera: Conjunctivae normal.  Cardiovascular:     Rate and Rhythm: Normal rate and regular rhythm.  Pulmonary:     Effort: Pulmonary effort is normal. No respiratory distress.     Breath sounds: No stridor.  Abdominal:     General: There is no distension.     Tenderness: There is no abdominal tenderness. There is no guarding.     Comments: Suprapubic catheter with clean exit site lower abdomen nontender  Skin:    General: Skin is warm and dry.  Neurological:     Mental Status: He is alert and oriented to person, place, and time.      ED Results / Procedures / Treatments   Labs (all labs ordered are listed, but only abnormal results are displayed) Labs Reviewed  COMPREHENSIVE METABOLIC PANEL - Abnormal; Notable for the following components:      Result Value   Calcium 8.6 (*)    AST 14 (*)    All other components within normal limits  CBC WITH DIFFERENTIAL/PLATELET - Abnormal; Notable for the following components:   RBC 2.26 (*)    Hemoglobin 6.5 (*)    HCT 21.9 (*)    MCHC 29.7 (*)    RDW 16.0 (*)    nRBC 1.4 (*)    All other components within normal limits  URINALYSIS, W/ REFLEX TO CULTURE (INFECTION SUSPECTED) - Abnormal; Notable for the following components:   Color, Urine RED (*)    APPearance TURBID (*)    Glucose, UA   (*)    Value: TEST NOT REPORTED DUE TO COLOR INTERFERENCE OF URINE PIGMENT   Hgb urine dipstick   (*)    Value: TEST NOT REPORTED DUE TO COLOR INTERFERENCE OF URINE PIGMENT   Bilirubin Urine   (*)    Value: TEST NOT REPORTED DUE TO COLOR INTERFERENCE OF URINE PIGMENT   Ketones, ur   (*)    Value: TEST NOT REPORTED DUE TO COLOR INTERFERENCE OF URINE PIGMENT   Protein, ur   (*)    Value: TEST NOT REPORTED DUE TO COLOR INTERFERENCE OF URINE PIGMENT   Nitrite   (*)    Value: TEST NOT REPORTED DUE TO COLOR INTERFERENCE OF URINE PIGMENT   Leukocytes,Ua   (*)    Value: TEST NOT REPORTED DUE TO COLOR INTERFERENCE OF URINE PIGMENT   Bacteria, UA RARE (*)    All other components within normal limits  URINE CULTURE  PROTIME-INR  TYPE AND SCREEN  PREPARE RBC (CROSSMATCH)    EKG None  Radiology No results found.  Procedures Procedures    Medications Ordered in ED Medications  0.9 %  sodium chloride infusion (Manually program via Guardrails IV Fluids) (has no administration in time range)    ED Course/ Medical Decision Making/ A&P                                 Medical Decision Making Elderly male with history of prostate cancer, chronic catheter now presents with  dizziness, lightheadedness, hematuria. Concern for symptomatic anemia with consideration of electrolyte abnormalities, dehydration, infection. Cardiac 90 sinus normal Pulse ox 100% room air normal   Amount and/or Complexity of Data Reviewed Independent Historian: caregiver External Data Reviewed: notes. Labs: ordered. Decision-making details documented  in ED Course.  Risk Prescription drug management. Decision regarding hospitalization.   11:34 AM Patient in no distress, aware of all findings, including hemoglobin 6.5.  I discussed patient's case with urology colleagues, and they have seen, evaluated the patient. With ongoing bleeding, urology as follows and consulting service, with concern for symptomatic anemia given the patient's ongoing bleeding from the suprapubic catheter he will be transfused 2 units, be admitted to the hospital for further monitoring, management. Per urology no emergent intervention, though options are being considered.   Final Clinical Impression(s) / ED Diagnoses Final diagnoses:  Symptomatic anemia   CRITICAL CARE Performed by: Gerhard Munch Total critical care time: 35 minutes Critical care time was exclusive of separately billable procedures and treating other patients. Critical care was necessary to treat or prevent imminent or life-threatening deterioration. Critical care was time spent personally by me on the following activities: development of treatment plan with patient and/or surrogate as well as nursing, discussions with consultants, evaluation of patient's response to treatment, examination of patient, obtaining history from patient or surrogate, ordering and performing treatments and interventions, ordering and review of laboratory studies, ordering and review of radiographic studies, pulse oximetry and re-evaluation of patient's condition.     Gerhard Munch, MD 02/15/23 1136

## 2023-02-15 NOTE — ED Notes (Signed)
ED TO INPATIENT HANDOFF REPORT  ED Nurse Name and Phone #: Crist Infante, RN [phone]  S Name/Age/Gender Nathan [name] 77 y.o. male Room/Bed: WA17/WA17  Code Status   Code Status: Full Code  Home/SNF/Other Home Patient oriented to: self, place, time, and situation Is this baseline? Yes   Triage Complete: Triage complete  Chief Complaint Acute blood loss anemia [D62]  Triage Note Patient presents to ER for bleeding from his suprapubic catheter. Wife endorses blood in catheter bag, she states he has been bleeding for about 2 weeks. Patient now starting to "slow down" and endorses dizziness. Patient takes Plavix every other day. At drs office a week ago hgb was 7.1 (per wife). Patient AAOx4.   Allergies No Known Allergies  Level of Care/Admitting Diagnosis ED Disposition     ED Disposition  Admit   Condition  --   Comment  Hospital Area: [facility] [[id number]]  Level of Care: Med-Surg [16]  May place patient in observation at [facility] or [facility] if equivalent level of care is available:: Yes  Covid Evaluation: Asymptomatic - no recent exposure (last 10 days) testing not required  Diagnosis: Acute blood loss anemia [829562]  Admitting Physician: Maryln Gottron [[id number]]  Attending Physician: Kirby Crigler, MIR M [[id number]]          B Medical/Surgery History Past Medical History:  Diagnosis Date   Anginal pain (HCC)    Atrial fibrillation (HCC) 01/2020   resolved now    Cancer of prostate (HCC) 2020   Chronic anticoagulation    Diabetes mellitus without complication (HCC)    type 2 diet controlled   Dysrhythmia    a fib   Foley catheter in place    will be changed 05-16-20   HTN (hypertension)    Lipoma of chest wall    Pneumonia 01/2020   and uti in hospital for 1 week   WPW (Wolff-Parkinson-White syndrome)    No prior ablation   Past Surgical History:  Procedure Laterality Date   CATARACT EXTRACTION Bilateral 2020   CYSTOSCOPY N/A  06/05/2021   Procedure: CYSTOSCOPY WITH SUPRA PUBIC TUBE PLACEMENT;  Surgeon: Heloise Purpura, MD;  [facility];  Service: Urology;  Laterality: N/A;   CYSTOSCOPY WITH FULGERATION N/A 01/27/2022   Procedure: CYSTOSCOPY WITH FULGERATION WITH CLOT EVACUATION;  Surgeon: Crist Fat, MD;  Location: [facility];  Service: Urology;  Laterality: N/A;   CYSTOSCOPY WITH FULGERATION N/A 03/02/2022   Procedure: CYSTOSCOPY, CLOT EVACUTION WITH TRANSURETHERAL FULGERATION OF BLADDER;  Surgeon: Heloise Purpura, MD;  [facility];  Service: Urology;  Laterality: N/A;   IR 3D INDEPENDENT WKST  01/17/2022   IR ANGIOGRAM PELVIS SELECTIVE OR SUPRASELECTIVE  01/17/2022   IR ANGIOGRAM SELECTIVE EACH ADDITIONAL VESSEL  01/17/2022   IR ANGIOGRAM SELECTIVE EACH ADDITIONAL VESSEL  01/17/2022   IR EMBO ART  VEN HEMORR LYMPH EXTRAV  INC GUIDE ROADMAPPING  01/17/2022   IR US GUIDE VASC ACCESS RIGHT  01/17/2022   LIPOMA EXCISION Left 05/16/2020   Procedure: EXCISION LIPOMA LEFT CHEST WALL;  Surgeon: Sheliah Hatch De Blanch, MD;  Location[facility];  Service: General;  Laterality: Left;   LOWER EXTREMITY VENOGRAPHY Right 09/17/2022   Procedure: LOWER EXTREMITY VENOGRAPHY;  Surgeon: Maeola Harman, MD;  Location: [facility] INVASIVE CV LAB;  Service: Cardiovascular;  Laterality: Right;   PERIPHERAL VASCULAR INTERVENTION  09/17/2022   Procedure: PERIPHERAL VASCULAR INTERVENTION;  Surgeon: Maeola Harman, MD;  Location: [facility] INVASIVE CV LAB;  Service:  Cardiovascular;;   RADIOACTIVE SEED IMPLANT  2020   TRANSURETHRAL RESECTION OF BLADDER TUMOR N/A 01/17/2022   Procedure: CYSTOSCOPY WITH CLOT EVACUATION, FULGERATION OF PROSTATE;  Surgeon: Jerilee Field, MD;  [facility];  Service: Urology;  Laterality: N/A;   VENA CAVA FILTER PLACEMENT Right 12/10/2021   Procedure: INSERTION VENA-CAVA FILTER Right Femoral;  Surgeon: Maeola Harman, MD;  Location: [facility] OR;  Service: Vascular;  Laterality:  Right;     A IV Location/Drains/Wounds Patient Lines/Drains/Airways Status     Active Line/Drains/Airways     Name Placement date Placement time Site Days   Peripheral IV 02/15/23 20 G 1" Anterior;Distal;Right;Upper Arm 02/15/23  0924  Arm  less than 1   Suprapubic Catheter Latex 16 Fr. 01/17/22  0840  Latex  394            Intake/Output Last 24 hours No intake or output data in the 24 hours ending 02/15/23 1328  Labs/Imaging Results for orders placed or performed during the hospital encounter of 02/15/23 (from the past 48 hour(s))  Comprehensive metabolic panel     Status: Abnormal   Collection Time: 02/15/23  9:19 AM  Result Value Ref Range   Sodium 138 135 - 145 mmol/L   Potassium 3.8 3.5 - 5.1 mmol/L   Chloride 107 98 - 111 mmol/L   CO2 25 22 - 32 mmol/L   Glucose, Bld 95 70 - 99 mg/dL    Comment: Glucose reference range applies only to samples taken after fasting for at least 8 hours.   BUN 12 8 - 23 mg/dL   Creatinine, Ser 2.13 0.61 - 1.24 mg/dL   Calcium 8.6 (L) 8.9 - 10.3 mg/dL   Total Protein 6.6 6.5 - 8.1 g/dL   Albumin 3.5 3.5 - 5.0 g/dL   AST 14 (L) 15 - 41 U/L   ALT 12 0 - 44 U/L   Alkaline Phosphatase 54 38 - 126 U/L   Total Bilirubin 0.4 0.3 - 1.2 mg/dL   GFR, Estimated >08 >65 mL/min    Comment: (NOTE) Calculated using the CKD-EPI Creatinine Equation (2021)    Anion gap 6 5 - 15    Comment: Performed at [facility], [address], [place], [place] [address]  CBC with Differential     Status: Abnormal   Collection Time: 02/15/23  9:19 AM  Result Value Ref Range   WBC 6.4 4.0 - 10.5 K/uL   RBC 2.26 (L) 4.22 - 5.81 MIL/uL   Hemoglobin 6.5 (LL) 13.0 - 17.0 g/dL    Comment: REPEATED TO VERIFY THIS CRITICAL RESULT HAS VERIFIED AND BEEN CALLED TO Lillard Bailon,T. BY NICOLE MCCOY ON 08 16 2024 AT 1019, AND HAS BEEN READ BACK.     HCT 21.9 (L) 39.0 - 52.0 %   MCV 96.9 80.0 - 100.0 fL   MCH 28.8 26.0 - 34.0 pg   MCHC 29.7 (L) 30.0 -  36.0 g/dL   RDW 62.9 (H) 52.8 - 41.3 %   Platelets 293 150 - 400 K/uL   nRBC 1.4 (H) 0.0 - 0.2 %   Neutrophils Relative % 72 %   Neutro Abs 4.6 1.7 - 7.7 K/uL   Lymphocytes Relative 13 %   Lymphs Abs 0.9 0.7 - 4.0 K/uL   Monocytes Relative 11 %   Monocytes Absolute 0.7 0.1 - 1.0 K/uL   Eosinophils Relative 3 %   Eosinophils Absolute 0.2 0.0 - 0.5 K/uL   Basophils Relative 0 %   Basophils  Absolute 0.0 0.0 - 0.1 K/uL   Immature Granulocytes 1 %   Abs Immature Granulocytes 0.05 0.00 - 0.07 K/uL    Comment: Performed at [facility], [address] [place], [place] [address]  Protime-INR     Status: None   Collection Time: 02/15/23  9:19 AM  Result Value Ref Range   Prothrombin Time 14.7 11.4 - 15.2 seconds   INR 1.1 0.8 - 1.2    Comment: (NOTE) INR goal varies based on device and disease states. Performed at [facility], [address] [place], [place] [address]   Type and screen [facility]     Status: None   Collection Time: 02/15/23  9:19 AM  Result Value Ref Range   ABO/RH(D) B POS    Antibody Screen NEG    Sample Expiration      02/18/2023,2359 Performed at [facility], [address] [place], [place] [address]   Urinalysis, w/ Reflex to Culture (Infection Suspected) -Urine, Suprapubic     Status: Abnormal   Collection Time: 02/15/23  9:19 AM  Result Value Ref Range   Specimen Source URINE, SUPRAPUBIC    Color, Urine RED (A) YELLOW    Comment: BIOCHEMICALS MAY BE AFFECTED BY COLOR   APPearance TURBID (A) CLEAR   Specific Gravity, Urine  1.005 - 1.030    TEST NOT REPORTED DUE TO COLOR INTERFERENCE OF URINE PIGMENT   pH  5.0 - 8.0    TEST NOT REPORTED DUE TO COLOR INTERFERENCE OF URINE PIGMENT   Glucose, UA (A) NEGATIVE mg/dL    TEST NOT REPORTED DUE TO COLOR INTERFERENCE OF URINE PIGMENT   Hgb urine dipstick (A) NEGATIVE    TEST NOT REPORTED DUE TO COLOR INTERFERENCE OF URINE PIGMENT    Bilirubin Urine (A) NEGATIVE    TEST NOT REPORTED DUE TO COLOR INTERFERENCE OF URINE PIGMENT   Ketones, ur (A) NEGATIVE mg/dL    TEST NOT REPORTED DUE TO COLOR INTERFERENCE OF URINE PIGMENT   Protein, ur (A) NEGATIVE mg/dL    TEST NOT REPORTED DUE TO COLOR INTERFERENCE OF URINE PIGMENT   Nitrite (A) NEGATIVE    TEST NOT REPORTED DUE TO COLOR INTERFERENCE OF URINE PIGMENT   Leukocytes,Ua (A) NEGATIVE    TEST NOT REPORTED DUE TO COLOR INTERFERENCE OF URINE PIGMENT   RBC / HPF >50 0 - 5 RBC/hpf   WBC, UA >50 0 - 5 WBC/hpf    Comment:        Reflex urine culture not performed if WBC <=10, OR if Squamous epithelial cells >5. If Squamous epithelial cells >5 suggest recollection.    Bacteria, UA RARE (A) NONE SEEN   Squamous Epithelial / HPF 0-5 0 - 5 /HPF    Comment: Performed at [facility], [address] [place], [place] [address]   No results found.  Pending Labs Unresulted Labs (From admission, onward)     Start     Ordered   02/16/23 0500  Basic metabolic panel  Tomorrow morning,   R        02/15/23 1238   02/16/23 0500  CBC  Tomorrow morning,   R        02/15/23 1238   02/15/23 1248  Hemoglobin A1c  Add-on,   AD        02/15/23 1247   02/15/23 1201  PSA  Once,   URGENT        02/15/23 1200  02/15/23 1135  Prepare RBC (crossmatch)  (Blood Administration Adult)  Once,   R       Question Answer Comment  # of Units 2 units   Transfusion Indications Hemoglobin < 7 gm/dL and symptomatic   Number of Units to Keep Ahead NO units ahead   If emergent release call blood bank Not emergent release      02/15/23 1134   02/15/23 0919  Urine Culture  Once,   R        02/15/23 0919            Vitals/Pain Today's Vitals   02/15/23 0850 02/15/23 0854 02/15/23 1115 02/15/23 1248  BP: (!) 142/77  108/70   Pulse: 92  80   Resp: 18  18   Temp: 98.2 F (36.8 C)   98.3 F (36.8 C)  TempSrc:    Oral  SpO2: 100%  100%   Weight:  77.1 kg    Height:  6\' 1"   (1.854 m)    PainSc:  0-No pain      Isolation Precautions No active isolations  Medications Medications  0.9 %  sodium chloride infusion (Manually program via Guardrails IV Fluids) (has no administration in time range)  oxyCODONE (Oxy IR/ROXICODONE) immediate release tablet 5 mg (has no administration in time range)  ferrous sulfate tablet 325 mg (has no administration in time range)  gabapentin (NEURONTIN) capsule 300 mg (has no administration in time range)  acetaminophen (TYLENOL) tablet 650 mg (has no administration in time range)    Or  acetaminophen (TYLENOL) suppository 650 mg (has no administration in time range)  traZODone (DESYREL) tablet 25 mg (has no administration in time range)  ondansetron (ZOFRAN) tablet 4 mg (has no administration in time range)    Or  ondansetron (ZOFRAN) injection 4 mg (has no administration in time range)  albuterol (PROVENTIL) (2.5 MG/3ML) 0.083% nebulizer solution 2.5 mg (has no administration in time range)    Mobility walks with device     Focused Assessments Neuro Assessment Handoff:  Swallow screen pass?  N/A         Neuro Assessment: Within Defined Limits Neuro Checks:      Has TPA been given? No If patient is a Neuro Trauma and patient is going to OR before floor call report to 4N Charge nurse: [phone] or [phone]   R Recommendations: See Admitting Provider Note  Report given to:   Additional Notes:

## 2023-02-15 NOTE — Consult Note (Addendum)
Urology Consult Note   Requesting Attending Physician:  Gerhard Munch, MD Service Providing Consult: Urology  Consulting Attending: Dr. Annabell Howells   Reason for Consult: Hematuria, ABLA  HPI: Nathan [name] is seen in consultation for reasons noted above at the request of Gerhard Munch, MD  ------------------  Assessment:  77 y.o. male with hematuria and ABLA.  Nathan [name] is well-known to our practice and has been followed by Dr. Laverle Patter for some time.  He was recently seen in clinic on 8/12 for follow-up of his ongoing hematuria.  PMH significant for prostate cancer-S/P XRT in 2021, left side prostate artery embolization-unable to embolize right side due to vasospasm at time of procedure, radiation cystitis, and SP tube.  His daughter has taken excellent care of him.  His SPT insertion site has healed beautifully with no erythema or purulent drainage.  She had irrigates frequently but states she has little to no improvement in his hematuria.  He remains on finasteride.  He has completed two courses of hyperbaric oxygen treatment with no improvement.    He remains on Plavix, s/p stenting of his right common and external iliac vein and treatment of severe post thrombotic syndrome.  He was followed by Dr. Randie Heinz.  He has had bleeding for some time and Plavix was changed to every other day.   Recommendations: # Hematuria # Radiation cystitis # BPH # Prostate cancer # Chronic SPT  Patient's bleeding issues from his enlarged prostate refractory to embolization and radiation cystitis are refractory to the few treatment options he had.  I worry that he requires Plavix perpetually, but it is unlikely we will be able to see resolution while this medicine is required. Radical treatment would include prostatectomy +/- cystectomy for prostatic bleeding and radiation cystitis respectively.  These are both complicated surgeries with difficult recovery as.  I do not believe the patient is a candidate  for either. Patient will be admitted for transfusion and overnight monitoring.  I will notify his surgeon of his arrival and review what options he may be considered for. Reviewed case with interventional radiology to see if there is a possibility of embolizing the other side of the prostate during this admission. Trend H&H. HgB 6.5 on admission. HgB drop of 0.6 over a week is manageable. Pt may need to check HgB outpt more frequently and transfuse as necessary   Case and plan discussed with Dr. Annabell Howells  Past Medical History: Past Medical History:  Diagnosis Date   Anginal pain (HCC)    Atrial fibrillation (HCC) 01/2020   resolved now    Cancer of prostate (HCC) 2020   Chronic anticoagulation    Diabetes mellitus without complication (HCC)    type 2 diet controlled   Dysrhythmia    a fib   Foley catheter in place    will be changed 05-16-20   HTN (hypertension)    Lipoma of chest wall    Pneumonia 01/2020   and uti in hospital for 1 week   WPW (Wolff-Parkinson-White syndrome)    No prior ablation    Past Surgical History:  Past Surgical History:  Procedure Laterality Date   CATARACT EXTRACTION Bilateral 2020   CYSTOSCOPY N/A 06/05/2021   Procedure: CYSTOSCOPY WITH SUPRA PUBIC TUBE PLACEMENT;  Surgeon: Heloise Purpura, MD;  Location: [facility];  Service: Urology;  Laterality: N/A;   CYSTOSCOPY WITH FULGERATION N/A 01/27/2022   Procedure: CYSTOSCOPY WITH FULGERATION WITH CLOT EVACUATION;  Surgeon: Crist Fat, MD;  Location[facility];  Service: Urology;  Laterality: N/A;   CYSTOSCOPY WITH FULGERATION N/A 03/02/2022   Procedure: CYSTOSCOPY, CLOT EVACUTION WITH TRANSURETHERAL FULGERATION OF BLADDER;  Surgeon: Heloise Purpura, MD;  Location[facility];  Service: Urology;  Laterality: N/A;   IR 3D INDEPENDENT WKST  01/17/2022   IR ANGIOGRAM PELVIS SELECTIVE OR SUPRASELECTIVE  01/17/2022   IR ANGIOGRAM SELECTIVE EACH ADDITIONAL VESSEL  01/17/2022   IR ANGIOGRAM SELECTIVE EACH ADDITIONAL  VESSEL  01/17/2022   IR EMBO ART  VEN HEMORR LYMPH EXTRAV  INC GUIDE ROADMAPPING  01/17/2022   IR US GUIDE VASC ACCESS RIGHT  01/17/2022   LIPOMA EXCISION Left 05/16/2020   Procedure: EXCISION LIPOMA LEFT CHEST WALL;  Surgeon: Sheliah Hatch De Blanch, MD;  Location: [facility];  Service: General;  Laterality: Left;   LOWER EXTREMITY VENOGRAPHY Right 09/17/2022   Procedure: LOWER EXTREMITY VENOGRAPHY;  Surgeon: Maeola Harman, MD;  Location: [facility] INVASIVE CV LAB;  Service: Cardiovascular;  Laterality: Right;   PERIPHERAL VASCULAR INTERVENTION  09/17/2022   Procedure: PERIPHERAL VASCULAR INTERVENTION;  Surgeon: Maeola Harman, MD;  Location: [facility] INVASIVE CV LAB;  Service: Cardiovascular;;   RADIOACTIVE SEED IMPLANT  2020   TRANSURETHRAL RESECTION OF BLADDER TUMOR N/A 01/17/2022   Procedure: CYSTOSCOPY WITH CLOT EVACUATION, FULGERATION OF PROSTATE;  Surgeon: Jerilee Field, MD;  [facility];  Service: Urology;  Laterality: N/A;   VENA CAVA FILTER PLACEMENT Right 12/10/2021   Procedure: INSERTION VENA-CAVA FILTER Right Femoral;  Surgeon: Maeola Harman, MD;  Location: [facility] OR;  Service: Vascular;  Laterality: Right;    Medication: Current Facility-Administered Medications  Medication Dose Route Frequency Provider Last Rate Last Admin   0.9 %  sodium chloride infusion (Manually program via Guardrails IV Fluids)   Intravenous Once Gerhard Munch, MD       Current Outpatient Medications  Medication Sig Dispense Refill   clopidogrel (PLAVIX) 75 MG tablet Take 1 tablet by mouth once daily 30 tablet 11   ergocalciferol (VITAMIN D2) 1.25 MG (50000 UT) capsule Take 50,000 Units by mouth once a week.     ferrous sulfate 325 (65 FE) MG tablet Take 325 mg by mouth daily with breakfast.     finasteride (PROSCAR) 5 MG tablet Take 5 mg by mouth daily.     folic acid (FOLVITE) 1 MG tablet Take 1 mg by mouth daily.     gabapentin (NEURONTIN) 300 MG capsule Take  300 mg by mouth 3 (three) times daily.     lisinopril (ZESTRIL) 40 MG tablet Take 40 mg by mouth daily.     magnesium oxide (MAG-OX) 400 MG tablet Take 400 mg by mouth daily.      Menthol-Methyl Salicylate (MUSCLE RUB) 10-15 % CREA Apply 1 Application topically as needed for muscle pain.     metoprolol succinate (TOPROL-XL) 100 MG 24 hr tablet Take 100 mg by mouth daily.     Omega-3 Fatty Acids (FISH OIL) 1000 MG CAPS Take 1,000 mg by mouth daily.     OVER THE COUNTER MEDICATION Take 1 capsule by mouth daily as needed (Siatic Nerve pain). Sciatiease     oxyCODONE (OXY IR/ROXICODONE) 5 MG immediate release tablet Take 1 tablet (5 mg total) by mouth every 6 (six) hours as needed for moderate pain. 10 tablet 0   Prenatal Vit-Fe Fumarate-FA (PRENATAL MULTIVITAMIN) TABS tablet Take 1 tablet by mouth daily at 12 noon.     verapamil (VERELAN PM) 120 MG 24 hr capsule Take 120 mg by mouth 2 (two)  times daily.     vitamin B-12 (CYANOCOBALAMIN) 1000 MCG tablet Take 1,000 mcg by mouth daily.      Allergies: No Known Allergies  Social History: Social History   Tobacco Use   Smoking status: Former    Current packs/day: 0.00    Average packs/day: 0.5 packs/day for 60.0 years (30.0 ttl pk-yrs)    Types: Cigarettes    Start date: 01/31/1959    Quit date: 01/31/2019    Years since quitting: 4.0   Smokeless tobacco: Never  Vaping Use   Vaping status: Never Used  Substance Use Topics   Alcohol use: Not Currently    Comment: no alochol since 2020   Drug use: Never    Family History Family History  Problem Relation Age of Onset   Lung cancer Mother    Cervical cancer Sister    Breast cancer Neg Hx    Pancreatic cancer Neg Hx    Colon cancer Neg Hx     Review of Systems  Unable to perform ROS: Medical condition     Objective   Vital signs in last 24 hours: BP 108/70   Pulse 80   Temp 98.2 F (36.8 C)   Resp 18   Ht 6\' 1"  (1.854 m)   Wt 77.1 kg   SpO2 100%   BMI 22.43 kg/m    Physical Exam General: NAD, A&O, resting, appropriate HEENT: /AT Pulmonary: Normal work of breathing Cardiovascular: RRR, no cyanosis Abdomen: Soft, NTTP, nondistended GU: SPT in place draining clear dark red urine Neuro: Appropriate, no focal neurological deficits  Most Recent Labs: Lab Results  Component Value Date   WBC 6.4 02/15/2023   HGB 6.5 (LL) 02/15/2023   HCT 21.9 (L) 02/15/2023   PLT 293 02/15/2023    Lab Results  Component Value Date   NA 138 02/15/2023   K 3.8 02/15/2023   CL 107 02/15/2023   CO2 25 02/15/2023   BUN 12 02/15/2023   CREATININE 1.20 02/15/2023   CALCIUM 8.6 (L) 02/15/2023   MG 2.0 02/14/2022   PHOS 5.3 (H) 01/24/2022    Lab Results  Component Value Date   INR 1.1 02/15/2023   APTT 29 02/27/2022     Urine Culture: @[id number] (laburin,org,[id number],[id number])@   IMAGING: No results found.  ------  Elmon Kirschner, NP Pager: [phone]   Please contact the urology consult pager with any further questions/concerns.

## 2023-02-15 NOTE — Plan of Care (Signed)
  Problem: Clinical Measurements: Goal: Will remain free from infection Outcome: Progressing   Problem: Activity: Goal: Risk for activity intolerance will decrease Outcome: Progressing   Problem: Nutrition: Goal: Adequate nutrition will be maintained Outcome: Progressing   Problem: Coping: Goal: Level of anxiety will decrease Outcome: Progressing   

## 2023-02-15 NOTE — ED Triage Notes (Signed)
Patient presents to ER for bleeding from his suprapubic catheter. Wife endorses blood in catheter bag, she states he has been bleeding for about 2 weeks. Patient now starting to "slow down" and endorses dizziness. Patient takes Plavix every other day. At drs office a week ago hgb was 7.1 (per wife). Patient AAOx4.

## 2023-02-16 ENCOUNTER — Observation Stay (HOSPITAL_COMMUNITY): Payer: Medicare HMO

## 2023-02-16 DIAGNOSIS — B9620 Unspecified Escherichia coli [E. coli] as the cause of diseases classified elsewhere: Secondary | ICD-10-CM | POA: Diagnosis present

## 2023-02-16 DIAGNOSIS — B9689 Other specified bacterial agents as the cause of diseases classified elsewhere: Secondary | ICD-10-CM | POA: Diagnosis present

## 2023-02-16 DIAGNOSIS — Z7901 Long term (current) use of anticoagulants: Secondary | ICD-10-CM | POA: Diagnosis not present

## 2023-02-16 DIAGNOSIS — N390 Urinary tract infection, site not specified: Secondary | ICD-10-CM | POA: Diagnosis present

## 2023-02-16 DIAGNOSIS — Y842 Radiological procedure and radiotherapy as the cause of abnormal reaction of the patient, or of later complication, without mention of misadventure at the time of the procedure: Secondary | ICD-10-CM | POA: Diagnosis present

## 2023-02-16 DIAGNOSIS — I456 Pre-excitation syndrome: Secondary | ICD-10-CM | POA: Diagnosis present

## 2023-02-16 DIAGNOSIS — Z1619 Resistance to other specified beta lactam antibiotics: Secondary | ICD-10-CM | POA: Diagnosis present

## 2023-02-16 DIAGNOSIS — D62 Acute posthemorrhagic anemia: Secondary | ICD-10-CM | POA: Diagnosis present

## 2023-02-16 DIAGNOSIS — Z9889 Other specified postprocedural states: Secondary | ICD-10-CM | POA: Diagnosis not present

## 2023-02-16 DIAGNOSIS — Z79899 Other long term (current) drug therapy: Secondary | ICD-10-CM | POA: Diagnosis not present

## 2023-02-16 DIAGNOSIS — R310 Gross hematuria: Secondary | ICD-10-CM | POA: Diagnosis present

## 2023-02-16 DIAGNOSIS — Z95820 Peripheral vascular angioplasty status with implants and grafts: Secondary | ICD-10-CM | POA: Diagnosis not present

## 2023-02-16 DIAGNOSIS — Y846 Urinary catheterization as the cause of abnormal reaction of the patient, or of later complication, without mention of misadventure at the time of the procedure: Secondary | ICD-10-CM | POA: Diagnosis present

## 2023-02-16 DIAGNOSIS — I87001 Postthrombotic syndrome without complications of right lower extremity: Secondary | ICD-10-CM | POA: Diagnosis present

## 2023-02-16 DIAGNOSIS — C61 Malignant neoplasm of prostate: Secondary | ICD-10-CM | POA: Diagnosis present

## 2023-02-16 DIAGNOSIS — Z9359 Other cystostomy status: Secondary | ICD-10-CM | POA: Diagnosis not present

## 2023-02-16 DIAGNOSIS — I10 Essential (primary) hypertension: Secondary | ICD-10-CM | POA: Diagnosis present

## 2023-02-16 DIAGNOSIS — Z9842 Cataract extraction status, left eye: Secondary | ICD-10-CM | POA: Diagnosis not present

## 2023-02-16 DIAGNOSIS — N400 Enlarged prostate without lower urinary tract symptoms: Secondary | ICD-10-CM | POA: Diagnosis present

## 2023-02-16 DIAGNOSIS — Z7902 Long term (current) use of antithrombotics/antiplatelets: Secondary | ICD-10-CM | POA: Diagnosis not present

## 2023-02-16 DIAGNOSIS — T83518A Infection and inflammatory reaction due to other urinary catheter, initial encounter: Secondary | ICD-10-CM | POA: Diagnosis present

## 2023-02-16 DIAGNOSIS — B965 Pseudomonas (aeruginosa) (mallei) (pseudomallei) as the cause of diseases classified elsewhere: Secondary | ICD-10-CM | POA: Diagnosis present

## 2023-02-16 DIAGNOSIS — Z9841 Cataract extraction status, right eye: Secondary | ICD-10-CM | POA: Diagnosis not present

## 2023-02-16 DIAGNOSIS — Z906 Acquired absence of other parts of urinary tract: Secondary | ICD-10-CM | POA: Diagnosis not present

## 2023-02-16 DIAGNOSIS — I480 Paroxysmal atrial fibrillation: Secondary | ICD-10-CM | POA: Diagnosis present

## 2023-02-16 DIAGNOSIS — E119 Type 2 diabetes mellitus without complications: Secondary | ICD-10-CM | POA: Diagnosis present

## 2023-02-16 HISTORY — PX: IR US GUIDE VASC ACCESS RIGHT: IMG2390

## 2023-02-16 HISTORY — PX: IR ANGIOGRAM PELVIS SELECTIVE OR SUPRASELECTIVE: IMG661

## 2023-02-16 HISTORY — PX: IR EMBO ARTERIAL NOT HEMORR HEMANG INC GUIDE ROADMAPPING: IMG5448

## 2023-02-16 LAB — BPAM RBC
Blood Product Expiration Date: 202409142359
Blood Product Expiration Date: 202409152359
ISSUE DATE / TIME: 202408161429
ISSUE DATE / TIME: 202408162237
Unit Type and Rh: 5100
Unit Type and Rh: 5100

## 2023-02-16 LAB — BASIC METABOLIC PANEL
Anion gap: 7 (ref 5–15)
BUN: 11 mg/dL (ref 8–23)
CO2: 26 mmol/L (ref 22–32)
Calcium: 8.3 mg/dL — ABNORMAL LOW (ref 8.9–10.3)
Chloride: 109 mmol/L (ref 98–111)
Creatinine, Ser: 1.15 mg/dL (ref 0.61–1.24)
GFR, Estimated: >60 mL/min (ref >60)
Glucose, Bld: 85 mg/dL (ref 70–99)
Potassium: 4.3 mmol/L (ref 3.5–5.1)
Sodium: 142 mmol/L (ref 135–145)

## 2023-02-16 LAB — TYPE AND SCREEN
ABO/RH(D): B POS
Antibody Screen: NEGATIVE
Unit division: 0
Unit division: 0

## 2023-02-16 LAB — CBC
HCT: 24.9 % — ABNORMAL LOW (ref 39.0–52.0)
Hemoglobin: 7.6 g/dL — ABNORMAL LOW (ref 13.0–17.0)
MCH: 28 pg (ref 26.0–34.0)
MCHC: 30.5 g/dL (ref 30.0–36.0)
MCV: 91.9 fL (ref 80.0–100.0)
Platelets: 256 10*3/uL (ref 150–400)
RBC: 2.71 MIL/uL — ABNORMAL LOW (ref 4.22–5.81)
RDW: 17.7 % — ABNORMAL HIGH (ref 11.5–15.5)
WBC: 5.7 10*3/uL (ref 4.0–10.5)
nRBC: 0.5 % — ABNORMAL HIGH (ref 0.0–0.2)

## 2023-02-16 LAB — HEMOGLOBIN AND HEMATOCRIT, BLOOD
HCT: 31.1 % — ABNORMAL LOW (ref 39.0–52.0)
HCT: 25.7 % — ABNORMAL LOW (ref 39.0–52.0)
Hemoglobin: 9.4 g/dL — ABNORMAL LOW (ref 13.0–17.0)
Hemoglobin: 8 g/dL — ABNORMAL LOW (ref 13.0–17.0)

## 2023-02-16 MED ORDER — IOHEXOL 300 MG/ML  SOLN: 100.0000 mL | Freq: Once | INTRAMUSCULAR | Status: DC | PRN

## 2023-02-16 MED ORDER — MIDAZOLAM HCL 2 MG/2ML IJ SOLN
INTRAMUSCULAR | Status: AC | PRN
  Administered 2023-02-16: 1 mg via INTRAVENOUS
  Administered 2023-02-16: .5 mg via INTRAVENOUS

## 2023-02-16 MED ORDER — LIDOCAINE HCL 1 % IJ SOLN
INTRAMUSCULAR | Status: AC
  Filled 2023-02-16: qty 20

## 2023-02-16 MED ORDER — ENSURE ENLIVE PO LIQD
237.0000 mL | Freq: Two times a day (BID) | ORAL | Status: DC
  Administered 2023-02-17 – 2023-02-23 (×12): 237 mL via ORAL

## 2023-02-16 MED ORDER — CIPROFLOXACIN HCL 500 MG PO TABS
500.0000 mg | ORAL_TABLET | Freq: Two times a day (BID) | ORAL | Status: DC
  Administered 2023-02-16 – 2023-02-19 (×6): 500 mg via ORAL
  Filled 2023-02-16 (×6): qty 1

## 2023-02-16 MED ORDER — FENTANYL CITRATE (PF) 100 MCG/2ML IJ SOLN
INTRAMUSCULAR | Status: AC
  Filled 2023-02-16: qty 2

## 2023-02-16 MED ORDER — SODIUM CHLORIDE (PF) 0.9 % IJ SOLN: INTRAVENOUS | Status: AC | PRN

## 2023-02-16 MED ORDER — IOHEXOL 300 MG/ML  SOLN
100.0000 mL | Freq: Once | INTRAMUSCULAR | Status: AC | PRN
  Administered 2023-02-16: 45 mL via INTRAVENOUS

## 2023-02-16 MED ORDER — MIDAZOLAM HCL 2 MG/2ML IJ SOLN
INTRAMUSCULAR | Status: AC
  Filled 2023-02-16: qty 2

## 2023-02-16 MED ORDER — NITROGLYCERIN 1 MG/10 ML FOR IR/CATH LAB
INTRA_ARTERIAL | Status: AC | PRN
  Administered 2023-02-16 (×2): 100 ug via INTRA_ARTERIAL

## 2023-02-16 MED ORDER — FENTANYL CITRATE (PF) 100 MCG/2ML IJ SOLN
INTRAMUSCULAR | Status: AC | PRN
  Administered 2023-02-16: 25 ug via INTRAVENOUS
  Administered 2023-02-16: 50 ug via INTRAVENOUS

## 2023-02-16 MED ORDER — NITROGLYCERIN IN D5W 100-5 MCG/ML-% IV SOLN
INTRAVENOUS | Status: AC
  Filled 2023-02-16: qty 250

## 2023-02-16 NOTE — Procedures (Signed)
Interventional Radiology Procedure Note  Procedure: Successful prostatic artery embolization.   Complications: None  Estimated Blood Loss: none  Recommendations: - Bedrest x 1 hr - moxifloxacin 400 mg PO every day x 7 days  Signed,  Sterling Big, MD

## 2023-02-16 NOTE — Progress Notes (Signed)
PROGRESS NOTE    Nathan [name]  EXB:[id number] DOB: [birth year] DOA: 02/15/2023 PCP: Dois Davenport, MD   Brief Narrative: Nathan [name] is a 77 y.o. male with a history of prostate cancer, hypertension, diabetes, DVT.  Patient presented secondary to bloody urine, weakness and dizziness with concern for acute hemorrhage leading to severe anemia requiring blood transfusion.  Urology was consulted with recommendation for our consult for prostatic artery embolization.  Prostatic artery embolization performed on 8/17 successfully.   Assessment and Plan:  Acute blood loss anemia Secondary to gross hematuria.  Baseline hemoglobin of around 11-12.  Hemoglobin of 6.5 g/dL on admission.  Patient transfused 2 units of PRBC with posttransfusion hemoglobin of 7.6 g/dL.  Hemoglobin up to 9.4 g/dL this morning. -CBC/H&H while patient has continued hematuria  Gross hematuria In relation to known prostate cancer and complicated by Plavix use. Urology consulted and recommended IR evaluation for embolization of right prostatic artery embolization. IR successfully embolized prostatic artery on 8/17.  Primary hypertension Patient is on metoprolol, verapamil and lisinopril as an outpatient. Antihypertensives held secondary to gross hematuria and resultant blood loss anemia requiring multiple blood transfusion.  Chronic suprapubic catheter Noted.  Positive urine culture Patient is afebrile and without leukocytosis. It does not appear suprapubic catheter was changed prior to obtaining urine culture.  Diabetes mellitus type 2 Well controlled. A1C of 5.0%.  History of right iliac stent Patient is on Plavix as an outpatient which is held on admission secondary to acute hemorrhaging.   DVT prophylaxis: SCDs Code Status:   Code Status: Full Code Family Communication: Daughter via telephone Disposition Plan: Discharge home likely in 2 to 4 days pending improved hematuria and stable  hemoglobin.   Consultants:  Urology Interventional radiology  Procedures:  8/17: Successful prostatic artery embolization  Antimicrobials: None    Subjective: Patient reports no concerns this morning. Feels well. No pain.  Objective: BP 134/61 (BP Location: Left Arm)   Pulse 66   Temp 98.7 F (37.1 C) (Oral)   Resp 18   Ht 6\' 1"  (1.854 m)   Wt 77.1 kg   SpO2 100%   BMI 22.43 kg/m   Examination:  General exam: Appears calm and comfortable Respiratory system: Clear to auscultation. Respiratory effort normal. Cardiovascular system: S1 & S2 heard, RRR. No murmurs, rubs, gallops or clicks. Gastrointestinal system: Abdomen is nondistended, soft and nontender. Normal bowel sounds heard. Genitourinary: catheter bag with bloody urine noted Central nervous system: Alert and oriented. No focal neurological deficits. Musculoskeletal: No edema. No calf tenderness Skin: No cyanosis. No rashes Psychiatry: Judgement and insight appear normal. Mood & affect appropriate.    Data Reviewed: I have personally reviewed following labs and imaging studies  CBC Lab Results  Component Value Date   WBC 5.7 02/16/2023   RBC 2.71 (L) 02/16/2023   HGB 9.4 (L) 02/16/2023   HCT 31.1 (L) 02/16/2023   MCV 91.9 02/16/2023   MCH 28.0 02/16/2023   PLT 256 02/16/2023   MCHC 30.5 02/16/2023   RDW 17.7 (H) 02/16/2023   LYMPHSABS 0.9 02/15/2023   MONOABS 0.7 02/15/2023   EOSABS 0.2 02/15/2023   BASOSABS 0.0 02/15/2023     Last metabolic panel Lab Results  Component Value Date   NA 142 02/16/2023   K 4.3 02/16/2023   CL 109 02/16/2023   CO2 26 02/16/2023   BUN 11 02/16/2023   CREATININE 1.15 02/16/2023   GLUCOSE 85 02/16/2023   GFRNONAA >60 02/16/2023   GFRAA >60 02/28/2020  CALCIUM 8.3 (L) 02/16/2023   PHOS 5.3 (H) 01/24/2022   PROT 6.6 02/15/2023   ALBUMIN 3.5 02/15/2023   BILITOT 0.4 02/15/2023   ALKPHOS 54 02/15/2023   AST 14 (L) 02/15/2023   ALT 12 02/15/2023   ANIONGAP  7 02/16/2023    GFR: Estimated Creatinine Clearance: 56.8 mL/min (by C-G formula based on SCr of 1.15 mg/dL).  Recent Results (from the past 240 hour(s))  Urine Culture     Status: Abnormal (Preliminary result)   Collection Time: 02/15/23  9:19 AM   Specimen: Urine, Random  Result Value Ref Range Status   Specimen Description   Final    URINE, RANDOM Performed at [facility], [address], [place], [place] [address]    Special Requests   Final    NONE Reflexed from [id number] Performed at [facility], [address] [place], [place] [address]    Culture (A)  Final    >=100,000 COLONIES/mL CITROBACTER KOSERI CULTURE REINCUBATED FOR BETTER GROWTH SUSCEPTIBILITIES TO FOLLOW Performed at [facility], [address], [place], [place] [address]    Report Status PENDING  Incomplete      Radiology Studies: IR Angiogram Pelvis Selective Or Supraselective  Result Date: 02/16/2023 INDICATION: 77 year old male with benign prostatic hyperplasia and recurrent hematuria in the setting of a mandatory antiplatelet therapy. He underwent partially successful prostate artery embolization 1 year previously in July of 2023. 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 EXAM: IR EMBO ARTERIAL NOT HEMORR HEMANG INC GUIDE ROADMAPPING; IR ULTRASOUND GUIDANCE VASC ACCESS RIGHT; PELVIC SELECTIVE ARTERIOGRAPHY MEDICATIONS: Bactrim DS was administered p.o. prior to the procedure. ANESTHESIA/SEDATION: Moderate (conscious) sedation was employed during this procedure. A total of Versed 1.5 mg and Fentanyl 75 mcg was administered intravenously. Moderate Sedation Time: 62 minutes. The patient's level of consciousness and vital signs were monitored continuously by radiology nursing throughout the procedure under my direct supervision. CONTRAST:  <See Chart> OMNIPAQUE IOHEXOL 300 MG/ML SOLN, 45mL OMNIPAQUE  IOHEXOL 300 MG/ML SOLN, 45mL OMNIPAQUE IOHEXOL 300 MG/ML SOLN FLUOROSCOPY: Radiation Exposure Index (as provided by the fluoroscopic device): 426 mGy Kerma COMPLICATIONS: None immediate. PROCEDURE: Informed consent was obtained from the patient following explanation of the procedure, risks, benefits and alternatives. The patient understands, agrees and consents for the procedure. All questions were addressed. A time out was performed prior to the initiation of the procedure. Maximal barrier sterile technique utilized including caps, mask, sterile gowns, sterile gloves, large sterile drape, hand hygiene, and Betadine prep. The right common femoral artery was interrogated with ultrasound and found to be widely patent. An image was obtained and stored for the medical record. Local anesthesia was attained by infiltration with 1% lidocaine. A small dermatotomy was made. Under real-time sonographic guidance, the vessel was punctured with a 21 gauge micropuncture needle. Using standard technique, the initial micro needle was exchanged over a 0.018 micro wire for a transitional 4 [place] micro sheath. The micro sheath was then exchanged over a 0.035 wire for a 5 French vascular sheath. A C2 cobra slip catheter was advanced up in over the aortic bifurcation into the left internal iliac artery. In internal iliac arteriogram was performed. Diminutive anterior division. No definite recurrent prostatic artery segments. To ensure no significant arterial supply to the left lobe of the prostate artery, a Cook cantata 2.5 French microcatheter was advanced into the anterior division and additional digital subtraction angiography was performed. No significant  recurrent prostatic arterialization. A the C2 cobra catheter was brought back into the ipsilateral common iliac artery and with the assistance of a Glidewire used to select the right internal iliac artery. A right internal iliac arteriogram was performed. Prostatic artery is  easily visualized arising from the proximal aspect of the obturator artery. To prevent arterial spasm, 100 mcg intra arterial nitroglycerin was administered. The Cook cantata microcatheter was successfully navigated into the prostatic artery. Contrast injection was performed. There is robust filling of the prostate gland. An additional 100 mcg intra arterial nitroglycerin was administered. Repeat contrast injection was performed confirming no significant collateralization or sources of non target embolization. There is atherosclerotic plaque resulting in narrowing of the mid aspect of the prostatic artery. Particle embolization was then performed using 100-300 micron embospheres. Follow-up arteriography demonstrates significant pruning with decreased flow and no significant parenchymal opacification in the prostatic arteries. To prevent recurrence, coil embolization of the prostatic artery was performed using low-profile Ruby detachable microcoils. Final contrast injection demonstrates technical success. The catheters were removed. Hemostasis was attained with the assistance of a Celt arterial closure device. IMPRESSION: 1. Successful combined particle and coil embolization of the previously untreated right prostatic artery. 2. Interrogation of the left prostatic artery demonstrates successful prior embolization without evidence of recurrent vascularity. Signed, Sterling Big, MD, RPVI [facility] Radiology Electronically Signed   By: Malachy Moan M.D.   On: 02/16/2023 11:10   IR Angiogram Pelvis Selective Or Supraselective  Result Date: 02/16/2023 INDICATION: 77 year old male with benign prostatic hyperplasia and recurrent hematuria in the setting of a mandatory antiplatelet therapy. He underwent partially successful prostate artery embolization 1 year previously in July of 2023. Unfortunately, at that time the right prostatic artery could not be  successfully cannulated due to arteriospasm. EXAM: IR EMBO ARTERIAL NOT HEMORR HEMANG INC GUIDE ROADMAPPING; IR ULTRASOUND GUIDANCE VASC ACCESS RIGHT; PELVIC SELECTIVE ARTERIOGRAPHY MEDICATIONS: Bactrim DS was administered p.o. prior to the procedure. ANESTHESIA/SEDATION: Moderate (conscious) sedation was employed during this procedure. A total of Versed 1.5 mg and Fentanyl 75 mcg was administered intravenously. Moderate Sedation Time: 62 minutes. The patient's level of consciousness and vital signs were monitored continuously by radiology nursing throughout the procedure under my direct supervision. CONTRAST:  <See Chart> OMNIPAQUE IOHEXOL 300 MG/ML SOLN, 45mL OMNIPAQUE IOHEXOL 300 MG/ML SOLN, 45mL OMNIPAQUE IOHEXOL 300 MG/ML SOLN FLUOROSCOPY: Radiation Exposure Index (as provided by the fluoroscopic device): 426 mGy Kerma COMPLICATIONS: None immediate. PROCEDURE: Informed consent was obtained from the patient following explanation of the procedure, risks, benefits and alternatives. The patient understands, agrees and consents for the procedure. All questions were addressed. A time out was performed prior to the initiation of the procedure. Maximal barrier sterile technique utilized including caps, mask, sterile gowns, sterile gloves, large sterile drape, hand hygiene, and Betadine prep. The right common femoral artery was interrogated with ultrasound and found to be widely patent. An image was obtained and stored for the medical record. Local anesthesia was attained by infiltration with 1% lidocaine. A small dermatotomy was made. Under real-time sonographic guidance, the vessel was punctured with a 21 gauge micropuncture needle. Using standard technique, the initial micro needle was exchanged over a 0.018 micro wire for a transitional 4 [place] micro sheath. The micro sheath was then exchanged over a 0.035 wire for a 5 French vascular sheath. A C2 cobra slip catheter was advanced up in over the aortic bifurcation  into the left internal iliac artery. In internal iliac arteriogram  was performed. Diminutive anterior division. No definite recurrent prostatic artery segments. To ensure no significant arterial supply to the left lobe of the prostate artery, a Cook cantata 2.5 French microcatheter was advanced into the anterior division and additional digital subtraction angiography was performed. No significant recurrent prostatic arterialization. A the C2 cobra catheter was brought back into the ipsilateral common iliac artery and with the assistance of a Glidewire used to select the right internal iliac artery. A right internal iliac arteriogram was performed. Prostatic artery is easily visualized arising from the proximal aspect of the obturator artery. To prevent arterial spasm, 100 mcg intra arterial nitroglycerin was administered. The Cook cantata microcatheter was successfully navigated into the prostatic artery. Contrast injection was performed. There is robust filling of the prostate gland. An additional 100 mcg intra arterial nitroglycerin was administered. Repeat contrast injection was performed confirming no significant collateralization or sources of non target embolization. There is atherosclerotic plaque resulting in narrowing of the mid aspect of the prostatic artery. Particle embolization was then performed using 100-300 micron embospheres. Follow-up arteriography demonstrates significant pruning with decreased flow and no significant parenchymal opacification in the prostatic arteries. To prevent recurrence, coil embolization of the prostatic artery was performed using low-profile Ruby detachable microcoils. Final contrast injection demonstrates technical success. The catheters were removed. Hemostasis was attained with the assistance of a Celt arterial closure device. IMPRESSION: 1. Successful combined particle and coil embolization of the previously untreated right prostatic artery. 2. Interrogation of the left  prostatic artery demonstrates successful prior embolization without evidence of recurrent vascularity. Signed, Sterling Big, MD, RPVI [facility] Radiology Electronically Signed   By: Malachy Moan M.D.   On: 02/16/2023 11:10   IR EMBO ARTERIAL NOT HEMORR HEMANG INC GUIDE ROADMAPPING  Result Date: 02/16/2023 INDICATION: 77 year old male with benign prostatic hyperplasia and recurrent hematuria in the setting of a mandatory antiplatelet therapy. He underwent partially successful prostate artery embolization 1 year previously in July of 2023. 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 EXAM: IR EMBO ARTERIAL NOT HEMORR HEMANG INC GUIDE ROADMAPPING; IR ULTRASOUND GUIDANCE VASC ACCESS RIGHT; PELVIC SELECTIVE ARTERIOGRAPHY MEDICATIONS: Bactrim DS was administered p.o. prior to the procedure. ANESTHESIA/SEDATION: Moderate (conscious) sedation was employed during this procedure. A total of Versed 1.5 mg and Fentanyl 75 mcg was administered intravenously. Moderate Sedation Time: 62 minutes. The patient's level of consciousness and vital signs were monitored continuously by radiology nursing throughout the procedure under my direct supervision. CONTRAST:  <See Chart> OMNIPAQUE IOHEXOL 300 MG/ML SOLN, 45mL OMNIPAQUE IOHEXOL 300 MG/ML SOLN, 45mL OMNIPAQUE IOHEXOL 300 MG/ML SOLN FLUOROSCOPY: Radiation Exposure Index (as provided by the fluoroscopic device): 426 mGy Kerma COMPLICATIONS: None immediate. PROCEDURE: Informed consent was obtained from the patient following explanation of the procedure, risks, benefits and alternatives. The patient understands, agrees and consents for the procedure. All questions were addressed. A time out was performed prior to the initiation of the procedure. Maximal barrier sterile technique utilized including caps, mask, sterile gowns, sterile gloves, large sterile  drape, hand hygiene, and Betadine prep. The right common femoral artery was interrogated with ultrasound and found to be widely patent. An image was obtained and stored for the medical record. Local anesthesia was attained by infiltration with 1% lidocaine. A small dermatotomy was made. Under real-time sonographic guidance, the vessel was punctured with a 21 gauge micropuncture needle. Using standard technique, the initial micro needle was exchanged over a 0.018  micro wire for a transitional 4 [place] micro sheath. The micro sheath was then exchanged over a 0.035 wire for a 5 French vascular sheath. A C2 cobra slip catheter was advanced up in over the aortic bifurcation into the left internal iliac artery. In internal iliac arteriogram was performed. Diminutive anterior division. No definite recurrent prostatic artery segments. To ensure no significant arterial supply to the left lobe of the prostate artery, a Cook cantata 2.5 French microcatheter was advanced into the anterior division and additional digital subtraction angiography was performed. No significant recurrent prostatic arterialization. A the C2 cobra catheter was brought back into the ipsilateral common iliac artery and with the assistance of a Glidewire used to select the right internal iliac artery. A right internal iliac arteriogram was performed. Prostatic artery is easily visualized arising from the proximal aspect of the obturator artery. To prevent arterial spasm, 100 mcg intra arterial nitroglycerin was administered. The Cook cantata microcatheter was successfully navigated into the prostatic artery. Contrast injection was performed. There is robust filling of the prostate gland. An additional 100 mcg intra arterial nitroglycerin was administered. Repeat contrast injection was performed confirming no significant collateralization or sources of non target embolization. There is atherosclerotic plaque resulting in narrowing of the mid aspect of the  prostatic artery. Particle embolization was then performed using 100-300 micron embospheres. Follow-up arteriography demonstrates significant pruning with decreased flow and no significant parenchymal opacification in the prostatic arteries. To prevent recurrence, coil embolization of the prostatic artery was performed using low-profile Ruby detachable microcoils. Final contrast injection demonstrates technical success. The catheters were removed. Hemostasis was attained with the assistance of a Celt arterial closure device. IMPRESSION: 1. Successful combined particle and coil embolization of the previously untreated right prostatic artery. 2. Interrogation of the left prostatic artery demonstrates successful prior embolization without evidence of recurrent vascularity. Signed, Sterling Big, MD, RPVI [facility] Radiology Electronically Signed   By: Malachy Moan M.D.   On: 02/16/2023 11:10   IR US Guide Vasc Access Right  Result Date: 02/16/2023 INDICATION: 77 year old male with benign prostatic hyperplasia and recurrent hematuria in the setting of a mandatory antiplatelet therapy. He underwent partially successful prostate artery embolization 1 year previously in July of 2023. 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 EXAM: IR EMBO ARTERIAL NOT HEMORR HEMANG INC GUIDE ROADMAPPING; IR ULTRASOUND GUIDANCE VASC ACCESS RIGHT; PELVIC SELECTIVE ARTERIOGRAPHY MEDICATIONS: Bactrim DS was administered p.o. prior to the procedure. ANESTHESIA/SEDATION: Moderate (conscious) sedation was employed during this procedure. A total of Versed 1.5 mg and Fentanyl 75 mcg was administered intravenously. Moderate Sedation Time: 62 minutes. The patient's level of consciousness and vital signs were monitored continuously by radiology nursing throughout the procedure under my direct supervision. CONTRAST:   <See Chart> OMNIPAQUE IOHEXOL 300 MG/ML SOLN, 45mL OMNIPAQUE IOHEXOL 300 MG/ML SOLN, 45mL OMNIPAQUE IOHEXOL 300 MG/ML SOLN FLUOROSCOPY: Radiation Exposure Index (as provided by the fluoroscopic device): 426 mGy Kerma COMPLICATIONS: None immediate. PROCEDURE: Informed consent was obtained from the patient following explanation of the procedure, risks, benefits and alternatives. The patient understands, agrees and consents for the procedure. All questions were addressed. A time out was performed prior to the initiation of the procedure. Maximal barrier sterile technique utilized including caps, mask, sterile gowns, sterile gloves, large sterile drape, hand hygiene, and Betadine prep. The right common femoral artery was interrogated with ultrasound and found to be widely patent. An image was obtained and  stored for the medical record. Local anesthesia was attained by infiltration with 1% lidocaine. A small dermatotomy was made. Under real-time sonographic guidance, the vessel was punctured with a 21 gauge micropuncture needle. Using standard technique, the initial micro needle was exchanged over a 0.018 micro wire for a transitional 4 [place] micro sheath. The micro sheath was then exchanged over a 0.035 wire for a 5 French vascular sheath. A C2 cobra slip catheter was advanced up in over the aortic bifurcation into the left internal iliac artery. In internal iliac arteriogram was performed. Diminutive anterior division. No definite recurrent prostatic artery segments. To ensure no significant arterial supply to the left lobe of the prostate artery, a Cook cantata 2.5 French microcatheter was advanced into the anterior division and additional digital subtraction angiography was performed. No significant recurrent prostatic arterialization. A the C2 cobra catheter was brought back into the ipsilateral common iliac artery and with the assistance of a Glidewire used to select the right internal iliac artery. A right  internal iliac arteriogram was performed. Prostatic artery is easily visualized arising from the proximal aspect of the obturator artery. To prevent arterial spasm, 100 mcg intra arterial nitroglycerin was administered. The Cook cantata microcatheter was successfully navigated into the prostatic artery. Contrast injection was performed. There is robust filling of the prostate gland. An additional 100 mcg intra arterial nitroglycerin was administered. Repeat contrast injection was performed confirming no significant collateralization or sources of non target embolization. There is atherosclerotic plaque resulting in narrowing of the mid aspect of the prostatic artery. Particle embolization was then performed using 100-300 micron embospheres. Follow-up arteriography demonstrates significant pruning with decreased flow and no significant parenchymal opacification in the prostatic arteries. To prevent recurrence, coil embolization of the prostatic artery was performed using low-profile Ruby detachable microcoils. Final contrast injection demonstrates technical success. The catheters were removed. Hemostasis was attained with the assistance of a Celt arterial closure device. IMPRESSION: 1. Successful combined particle and coil embolization of the previously untreated right prostatic artery. 2. Interrogation of the left prostatic artery demonstrates successful prior embolization without evidence of recurrent vascularity. Signed, Sterling Big, MD, RPVI [facility] Radiology Electronically Signed   By: Malachy Moan M.D.   On: 02/16/2023 11:10      LOS: 0 days    Jacquelin Hawking, MD [facility] 02/16/2023, 4:07 PM   If 7PM-7AM, please contact night-coverage [email or web address]

## 2023-02-16 NOTE — Plan of Care (Signed)

## 2023-02-16 NOTE — Hospital Course (Signed)
Nathan [name] is a 77 y.o. male with a history of prostate cancer, hypertension, diabetes, DVT.  Patient presented secondary to bloody urine, weakness and dizziness with concern for acute hemorrhage leading to severe anemia requiring blood transfusion.  Urology was consulted with recommendation for our consult for prostatic artery embolization.  Right prostatic artery embolization performed on 8/17 successfully.

## 2023-02-16 NOTE — Progress Notes (Signed)
Subjective: His urine remains bloody.  He has no complaints.  His Hgb is up to 7.6 after a transfusion.  He is scheduled for an attempt a right prostatic artery embolization.   ROS:  Review of Systems  All other systems reviewed and are negative.   Anti-infectives: Anti-infectives (From admission, onward)    Start     Dose/Rate Route Frequency Ordered Stop   02/16/23 0800  sulfamethoxazole-trimethoprim (BACTRIM) 400-80 MG per tablet 1 tablet        1 tablet Oral  Once 02/15/23 1626         Current Facility-Administered Medications  Medication Dose Route Frequency Provider Last Rate Last Admin   acetaminophen (TYLENOL) tablet 650 mg  650 mg Oral Q6H PRN Kirby Crigler, Mir M, MD       Or   acetaminophen (TYLENOL) suppository 650 mg  650 mg Rectal Q6H PRN Kirby Crigler, Mir M, MD       albuterol (PROVENTIL) (2.5 MG/3ML) 0.083% nebulizer solution 2.5 mg  2.5 mg Nebulization Q2H PRN Kirby Crigler, Mir M, MD       ferrous sulfate tablet 325 mg  325 mg Oral Q breakfast Kirby Crigler, Mir M, MD       gabapentin (NEURONTIN) capsule 300 mg  300 mg Oral TID Kirby Crigler, Mir M, MD   300 mg at 02/15/23 2248   ondansetron (ZOFRAN) tablet 4 mg  4 mg Oral Q6H PRN Kirby Crigler, Mir M, MD       Or   ondansetron [facility]) injection 4 mg  4 mg Intravenous Q6H PRN Kirby Crigler, Mir M, MD       oxyCODONE (Oxy IR/ROXICODONE) immediate release tablet 5 mg  5 mg Oral Q6H PRN Kirby Crigler, Mir M, MD       sulfamethoxazole-trimethoprim (BACTRIM) 400-80 MG per tablet 1 tablet  1 tablet Oral Once Allred, Darrell K, PA-C       traZODone (DESYREL) tablet 25 mg  25 mg Oral QHS PRN Kirby Crigler, Mir M, MD         Objective: Vital signs in last 24 hours: Temp:  [97.3 F (36.3 C)-98.7 F (37.1 C)] 98.2 F (36.8 C) (08/17 0331) Pulse Rate:  [76-92] 76 (08/17 0331) Resp:  [15-18] 16 (08/17 0331) BP: (108-143)/(68-98) 125/76 (08/17 0331) SpO2:  [95 %-100 %] 99 % (08/17 0331) Weight:  [77.1 kg] 77.1 kg (08/16  0854)  Intake/Output from previous day: 08/16 0701 - 08/17 0700 In: 971.3 [I.V.:11.3; Blood:720] Out: -  Intake/Output this shift: No intake/output data recorded.   Physical Exam Vitals reviewed.  Constitutional:      Appearance: Normal appearance.  Genitourinary:    Comments: Urine is dark red without clots in foley tubing.  Neurological:     Mental Status: He is alert.     Lab Results:  Recent Labs    02/15/23 0919 02/16/23 0603  WBC 6.4 5.7  HGB 6.5* 7.6*  HCT 21.9* 24.9*  PLT 293 256   BMET Recent Labs    02/15/23 0919 02/16/23 0603  NA 138 142  K 3.8 4.3  CL 107 109  CO2 25 26  GLUCOSE 95 85  BUN 12 11  CREATININE 1.20 1.15  CALCIUM 8.6* 8.3*   PT/INR Recent Labs    02/15/23 0919  LABPROT 14.7  INR 1.1   ABG No results for input(s): "PHART", "HCO3" in the last 72 hours.  Invalid input(s): "PCO2", "PO2"  Studies/Results: No results found.   Assessment and Plan: Persistent hematuria on plavix with history of prostate cancer  and prior radiation.   He is to have an attempt at a right prostatic artery embolization which was unsuccessful previously.  Chronic blood loss anemia.  Improved with transfusion.       LOS: 0 days    Nathan [name] 8/17/2024Patient ID: Nathan [name], male   DOB: [birth year], 77 y.o.   MRN: [id number]

## 2023-02-17 DIAGNOSIS — D62 Acute posthemorrhagic anemia: Secondary | ICD-10-CM | POA: Diagnosis not present

## 2023-02-17 LAB — CBC
HCT: 25.3 % — ABNORMAL LOW (ref 39.0–52.0)
Hemoglobin: 7.8 g/dL — ABNORMAL LOW (ref 13.0–17.0)
MCH: 28.5 pg (ref 26.0–34.0)
MCHC: 30.8 g/dL (ref 30.0–36.0)
MCV: 92.3 fL (ref 80.0–100.0)
Platelets: 262 10*3/uL (ref 150–400)
RBC: 2.74 MIL/uL — ABNORMAL LOW (ref 4.22–5.81)
RDW: 17.5 % — ABNORMAL HIGH (ref 11.5–15.5)
WBC: 7.1 10*3/uL (ref 4.0–10.5)
nRBC: 0.6 % — ABNORMAL HIGH (ref 0.0–0.2)

## 2023-02-17 LAB — HEMOGLOBIN AND HEMATOCRIT, BLOOD
HCT: 27.6 % — ABNORMAL LOW (ref 39.0–52.0)
HCT: 24.8 % — ABNORMAL LOW (ref 39.0–52.0)
Hemoglobin: 8.5 g/dL — ABNORMAL LOW (ref 13.0–17.0)
Hemoglobin: 7.7 g/dL — ABNORMAL LOW (ref 13.0–17.0)

## 2023-02-17 NOTE — Progress Notes (Signed)
PROGRESS NOTE    Nathan [name]  VHQ:[id number] DOB: Aug 22, 1945 DOA: 02/15/2023 PCP: Dois Davenport, MD   Brief Narrative: Nathan [name] is a 77 y.o. male with a history of prostate cancer, hypertension, diabetes, DVT.  Patient presented secondary to bloody urine, weakness and dizziness with concern for acute hemorrhage leading to severe anemia requiring blood transfusion.  Urology was consulted with recommendation for our consult for prostatic artery embolization.  Right prostatic artery embolization performed on 8/17 successfully.   Assessment and Plan:  Acute blood loss anemia Secondary to gross hematuria.  Baseline hemoglobin of around 11-12.  Hemoglobin of 6.5 g/dL on admission.  Patient transfused 2 units of PRBC with posttransfusion hemoglobin of 7.6 g/dL.  Hemoglobin up to 9.4 g/dL this morning. -CBC/H&H while patient has continued hematuria  Gross hematuria In relation to known prostate cancer and complicated by Plavix use. Urology consulted and recommended IR evaluation for embolization of right prostatic artery embolization. IR successfully embolized right prostatic artery on 8/17.  Primary hypertension Patient is on metoprolol, verapamil and lisinopril as an outpatient. Antihypertensives held secondary to gross hematuria and resultant blood loss anemia requiring multiple blood transfusion.  Chronic suprapubic catheter Noted.  Positive urine culture Patient is afebrile and without leukocytosis. It does not appear suprapubic catheter was changed prior to obtaining urine culture.  Diabetes mellitus type 2 Well controlled. A1C of 5.0%.  History of right iliac stent Patient is on Plavix as an outpatient which is held on admission secondary to acute hemorrhaging.   DVT prophylaxis: SCDs Code Status:   Code Status: Full Code Family Communication: Daughter via telephone Disposition Plan: Discharge home likely in 2 to 4 days pending improved hematuria and stable  hemoglobin.   Consultants:  Urology Interventional radiology  Procedures:  8/17: Successful prostatic artery embolization  Antimicrobials: None    Subjective: Continued hematuria. Daughter is concerned as the current events are mirroring prior presentation.  Objective: BP 124/81 (BP Location: Left Arm)   Pulse (!) 109   Temp (!) 97.2 F (36.2 C) (Oral)   Resp 18   Ht 6\' 1"  (1.854 m)   Wt 77.1 kg   SpO2 100%   BMI 22.43 kg/m   Examination:  General exam: Appears calm and comfortable Respiratory system: Clear to auscultation. Respiratory effort normal. Cardiovascular system: S1 & S2 heard, RRR. Gastrointestinal system: Abdomen is nondistended, soft and nontender. Normal bowel sounds heard. Central nervous system: Alert and oriented. No focal neurological deficits. Musculoskeletal: No edema. No calf tenderness Skin: No cyanosis. No rashes Psychiatry: Judgement and insight appear normal. Mood & affect appropriate.    Data Reviewed: I have personally reviewed following labs and imaging studies  CBC Lab Results  Component Value Date   WBC 7.1 02/17/2023   RBC 2.74 (L) 02/17/2023   HGB 8.5 (L) 02/17/2023   HCT 27.6 (L) 02/17/2023   MCV 92.3 02/17/2023   MCH 28.5 02/17/2023   PLT 262 02/17/2023   MCHC 30.8 02/17/2023   RDW 17.5 (H) 02/17/2023   LYMPHSABS 0.9 02/15/2023   MONOABS 0.7 02/15/2023   EOSABS 0.2 02/15/2023   BASOSABS 0.0 02/15/2023     Last metabolic panel Lab Results  Component Value Date   NA 142 02/16/2023   K 4.3 02/16/2023   CL 109 02/16/2023   CO2 26 02/16/2023   BUN 11 02/16/2023   CREATININE 1.15 02/16/2023   GLUCOSE 85 02/16/2023   GFRNONAA >60 02/16/2023   GFRAA >60 02/28/2020   CALCIUM 8.3 (L) 02/16/2023  PHOS 5.3 (H) 01/24/2022   PROT 6.6 02/15/2023   ALBUMIN 3.5 02/15/2023   BILITOT 0.4 02/15/2023   ALKPHOS 54 02/15/2023   AST 14 (L) 02/15/2023   ALT 12 02/15/2023   ANIONGAP 7 02/16/2023    GFR: Estimated  Creatinine Clearance: 56.8 mL/min (by C-G formula based on SCr of 1.15 mg/dL).  Recent Results (from the past 240 hour(s))  Urine Culture     Status: Abnormal (Preliminary result)   Collection Time: 02/15/23  9:19 AM   Specimen: Urine, Random  Result Value Ref Range Status   Specimen Description   Final    URINE, RANDOM Performed at [facility], [address] [place], [place] [address]    Special Requests   Final    NONE Reflexed from [id number] Performed at [facility], [address] [place], [place] [address]    Culture (A)  Final    >=100,000 COLONIES/mL CITROBACTER KOSERI 50,000 COLONIES/mL PSEUDOMONAS AERUGINOSA 50,000 COLONIES/mL ESCHERICHIA COLI SUSCEPTIBILITIES TO FOLLOW Performed at [facility], [address], [place], [place] [address]    Report Status PENDING  Incomplete   Organism ID, Bacteria CITROBACTER KOSERI (A)  Final      Susceptibility   Citrobacter koseri - MIC*    CEFEPIME <=0.12 SENSITIVE Sensitive     CEFTRIAXONE <=0.25 SENSITIVE Sensitive     CIPROFLOXACIN <=0.25 SENSITIVE Sensitive     GENTAMICIN <=1 SENSITIVE Sensitive     IMIPENEM <=0.25 SENSITIVE Sensitive     NITROFURANTOIN 32 SENSITIVE Sensitive     TRIMETH/SULFA <=20 SENSITIVE Sensitive     PIP/TAZO <=4 SENSITIVE Sensitive     * >=100,000 COLONIES/mL CITROBACTER KOSERI      Radiology Studies: IR Angiogram Pelvis Selective Or Supraselective  Result Date: 02/16/2023 INDICATION: 77 year old male with benign prostatic hyperplasia and recurrent hematuria in the setting of a mandatory antiplatelet therapy. He underwent partially successful prostate artery embolization 1 year previously in July of 2023. 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 EXAM: IR EMBO ARTERIAL NOT HEMORR HEMANG INC GUIDE ROADMAPPING; IR ULTRASOUND GUIDANCE VASC ACCESS RIGHT; PELVIC SELECTIVE ARTERIOGRAPHY  MEDICATIONS: Bactrim DS was administered p.o. prior to the procedure. ANESTHESIA/SEDATION: Moderate (conscious) sedation was employed during this procedure. A total of Versed 1.5 mg and Fentanyl 75 mcg was administered intravenously. Moderate Sedation Time: 62 minutes. The patient's level of consciousness and vital signs were monitored continuously by radiology nursing throughout the procedure under my direct supervision. CONTRAST:  <See Chart> OMNIPAQUE IOHEXOL 300 MG/ML SOLN, 45mL OMNIPAQUE IOHEXOL 300 MG/ML SOLN, 45mL OMNIPAQUE IOHEXOL 300 MG/ML SOLN FLUOROSCOPY: Radiation Exposure Index (as provided by the fluoroscopic device): 426 mGy Kerma COMPLICATIONS: None immediate. PROCEDURE: Informed consent was obtained from the patient following explanation of the procedure, risks, benefits and alternatives. The patient understands, agrees and consents for the procedure. All questions were addressed. A time out was performed prior to the initiation of the procedure. Maximal barrier sterile technique utilized including caps, mask, sterile gowns, sterile gloves, large sterile drape, hand hygiene, and Betadine prep. The right common femoral artery was interrogated with ultrasound and found to be widely patent. An image was obtained and stored for the medical record. Local anesthesia was attained by infiltration with 1% lidocaine. A small dermatotomy was made. Under real-time sonographic guidance, the vessel was punctured with a 21 gauge micropuncture needle. Using standard technique, the initial micro needle was exchanged over a 0.018 micro wire for  a transitional 4 [place] micro sheath. The micro sheath was then exchanged over a 0.035 wire for a 5 French vascular sheath. A C2 cobra slip catheter was advanced up in over the aortic bifurcation into the left internal iliac artery. In internal iliac arteriogram was performed. Diminutive anterior division. No definite recurrent prostatic artery segments. To ensure no  significant arterial supply to the left lobe of the prostate artery, a Cook cantata 2.5 French microcatheter was advanced into the anterior division and additional digital subtraction angiography was performed. No significant recurrent prostatic arterialization. A the C2 cobra catheter was brought back into the ipsilateral common iliac artery and with the assistance of a Glidewire used to select the right internal iliac artery. A right internal iliac arteriogram was performed. Prostatic artery is easily visualized arising from the proximal aspect of the obturator artery. To prevent arterial spasm, 100 mcg intra arterial nitroglycerin was administered. The Cook cantata microcatheter was successfully navigated into the prostatic artery. Contrast injection was performed. There is robust filling of the prostate gland. An additional 100 mcg intra arterial nitroglycerin was administered. Repeat contrast injection was performed confirming no significant collateralization or sources of non target embolization. There is atherosclerotic plaque resulting in narrowing of the mid aspect of the prostatic artery. Particle embolization was then performed using 100-300 micron embospheres. Follow-up arteriography demonstrates significant pruning with decreased flow and no significant parenchymal opacification in the prostatic arteries. To prevent recurrence, coil embolization of the prostatic artery was performed using low-profile Ruby detachable microcoils. Final contrast injection demonstrates technical success. The catheters were removed. Hemostasis was attained with the assistance of a Celt arterial closure device. IMPRESSION: 1. Successful combined particle and coil embolization of the previously untreated right prostatic artery. 2. Interrogation of the left prostatic artery demonstrates successful prior embolization without evidence of recurrent vascularity. Signed, Sterling Big, MD, RPVI [facility] Inter[facility]  [facility] Radiology Electronically Signed   By: Malachy Moan M.D.   On: 02/16/2023 11:10   IR Angiogram Pelvis Selective Or Supraselective  Result Date: 02/16/2023 INDICATION: 77 year old male with benign prostatic hyperplasia and recurrent hematuria in the setting of a mandatory antiplatelet therapy. He underwent partially successful prostate artery embolization 1 year previously in July of 2023. 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 EXAM: IR EMBO ARTERIAL NOT HEMORR HEMANG INC GUIDE ROADMAPPING; IR ULTRASOUND GUIDANCE VASC ACCESS RIGHT; PELVIC SELECTIVE ARTERIOGRAPHY MEDICATIONS: Bactrim DS was administered p.o. prior to the procedure. ANESTHESIA/SEDATION: Moderate (conscious) sedation was employed during this procedure. A total of Versed 1.5 mg and Fentanyl 75 mcg was administered intravenously. Moderate Sedation Time: 62 minutes. The patient's level of consciousness and vital signs were monitored continuously by radiology nursing throughout the procedure under my direct supervision. CONTRAST:  <See Chart> OMNIPAQUE IOHEXOL 300 MG/ML SOLN, 45mL OMNIPAQUE IOHEXOL 300 MG/ML SOLN, 45mL OMNIPAQUE IOHEXOL 300 MG/ML SOLN FLUOROSCOPY: Radiation Exposure Index (as provided by the fluoroscopic device): 426 mGy Kerma COMPLICATIONS: None immediate. PROCEDURE: Informed consent was obtained from the patient following explanation of the procedure, risks, benefits and alternatives. The patient understands, agrees and consents for the procedure. All questions were addressed. A time out was performed prior to the initiation of the procedure. Maximal barrier sterile technique utilized including caps, mask, sterile gowns, sterile gloves, large sterile drape, hand hygiene, and Betadine prep. The right common femoral artery was interrogated with ultrasound and found to be widely patent. An image was obtained and stored for the  medical record. Local anesthesia was attained by infiltration with 1% lidocaine. A small dermatotomy was made. Under real-time sonographic guidance, the vessel was punctured with a 21 gauge micropuncture needle. Using standard technique, the initial micro needle was exchanged over a 0.018 micro wire for a transitional 4 [place] micro sheath. The micro sheath was then exchanged over a 0.035 wire for a 5 French vascular sheath. A C2 cobra slip catheter was advanced up in over the aortic bifurcation into the left internal iliac artery. In internal iliac arteriogram was performed. Diminutive anterior division. No definite recurrent prostatic artery segments. To ensure no significant arterial supply to the left lobe of the prostate artery, a Cook cantata 2.5 French microcatheter was advanced into the anterior division and additional digital subtraction angiography was performed. No significant recurrent prostatic arterialization. A the C2 cobra catheter was brought back into the ipsilateral common iliac artery and with the assistance of a Glidewire used to select the right internal iliac artery. A right internal iliac arteriogram was performed. Prostatic artery is easily visualized arising from the proximal aspect of the obturator artery. To prevent arterial spasm, 100 mcg intra arterial nitroglycerin was administered. The Cook cantata microcatheter was successfully navigated into the prostatic artery. Contrast injection was performed. There is robust filling of the prostate gland. An additional 100 mcg intra arterial nitroglycerin was administered. Repeat contrast injection was performed confirming no significant collateralization or sources of non target embolization. There is atherosclerotic plaque resulting in narrowing of the mid aspect of the prostatic artery. Particle embolization was then performed using 100-300 micron embospheres. Follow-up arteriography demonstrates significant pruning with decreased flow and no  significant parenchymal opacification in the prostatic arteries. To prevent recurrence, coil embolization of the prostatic artery was performed using low-profile Ruby detachable microcoils. Final contrast injection demonstrates technical success. The catheters were removed. Hemostasis was attained with the assistance of a Celt arterial closure device. IMPRESSION: 1. Successful combined particle and coil embolization of the previously untreated right prostatic artery. 2. Interrogation of the left prostatic artery demonstrates successful prior embolization without evidence of recurrent vascularity. Signed, Sterling Big, MD, RPVI [facility] Interventional Radiology Specialists [facility] Radiology Electronically Signed   By: Malachy Moan M.D.   On: 02/16/2023 11:10   IR EMBO ARTERIAL NOT HEMORR HEMANG INC GUIDE ROADMAPPING  Result Date: 02/16/2023 INDICATION: 77 year old male with benign prostatic hyperplasia and recurrent hematuria in the setting of a mandatory antiplatelet therapy. He underwent partially successful prostate artery embolization 1 year previously in July of 2023. 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 EXAM: IR EMBO ARTERIAL NOT HEMORR HEMANG INC GUIDE ROADMAPPING; IR ULTRASOUND GUIDANCE VASC ACCESS RIGHT; PELVIC SELECTIVE ARTERIOGRAPHY MEDICATIONS: Bactrim DS was administered p.o. prior to the procedure. ANESTHESIA/SEDATION: Moderate (conscious) sedation was employed during this procedure. A total of Versed 1.5 mg and Fentanyl 75 mcg was administered intravenously. Moderate Sedation Time: 62 minutes. The patient's level of consciousness and vital signs were monitored continuously by radiology nursing throughout the procedure under my direct supervision. CONTRAST:  <See Chart> OMNIPAQUE IOHEXOL 300 MG/ML SOLN, 45mL OMNIPAQUE IOHEXOL 300 MG/ML SOLN, 45mL OMNIPAQUE IOHEXOL 300 MG/ML SOLN FLUOROSCOPY: Radiation  Exposure Index (as provided by the fluoroscopic device): 426 mGy Kerma COMPLICATIONS: None immediate. PROCEDURE: Informed consent was obtained from the patient following explanation of the procedure, risks, benefits and alternatives. The patient understands, agrees and consents for the procedure. All questions were addressed. A time out was performed prior to  the initiation of the procedure. Maximal barrier sterile technique utilized including caps, mask, sterile gowns, sterile gloves, large sterile drape, hand hygiene, and Betadine prep. The right common femoral artery was interrogated with ultrasound and found to be widely patent. An image was obtained and stored for the medical record. Local anesthesia was attained by infiltration with 1% lidocaine. A small dermatotomy was made. Under real-time sonographic guidance, the vessel was punctured with a 21 gauge micropuncture needle. Using standard technique, the initial micro needle was exchanged over a 0.018 micro wire for a transitional 4 [place] micro sheath. The micro sheath was then exchanged over a 0.035 wire for a 5 French vascular sheath. A C2 cobra slip catheter was advanced up in over the aortic bifurcation into the left internal iliac artery. In internal iliac arteriogram was performed. Diminutive anterior division. No definite recurrent prostatic artery segments. To ensure no significant arterial supply to the left lobe of the prostate artery, a Cook cantata 2.5 French microcatheter was advanced into the anterior division and additional digital subtraction angiography was performed. No significant recurrent prostatic arterialization. A the C2 cobra catheter was brought back into the ipsilateral common iliac artery and with the assistance of a Glidewire used to select the right internal iliac artery. A right internal iliac arteriogram was performed. Prostatic artery is easily visualized arising from the proximal aspect of the obturator artery. To prevent  arterial spasm, 100 mcg intra arterial nitroglycerin was administered. The Cook cantata microcatheter was successfully navigated into the prostatic artery. Contrast injection was performed. There is robust filling of the prostate gland. An additional 100 mcg intra arterial nitroglycerin was administered. Repeat contrast injection was performed confirming no significant collateralization or sources of non target embolization. There is atherosclerotic plaque resulting in narrowing of the mid aspect of the prostatic artery. Particle embolization was then performed using 100-300 micron embospheres. Follow-up arteriography demonstrates significant pruning with decreased flow and no significant parenchymal opacification in the prostatic arteries. To prevent recurrence, coil embolization of the prostatic artery was performed using low-profile Ruby detachable microcoils. Final contrast injection demonstrates technical success. The catheters were removed. Hemostasis was attained with the assistance of a Celt arterial closure device. IMPRESSION: 1. Successful combined particle and coil embolization of the previously untreated right prostatic artery. 2. Interrogation of the left prostatic artery demonstrates successful prior embolization without evidence of recurrent vascularity. Signed, Sterling Big, MD, RPVI [facility] Interventional Radiology Specialists [facility] Radiology Electronically Signed   By: Malachy Moan M.D.   On: 02/16/2023 11:10   IR US Guide Vasc Access Right  Result Date: 02/16/2023 INDICATION: 77 year old male with benign prostatic hyperplasia and recurrent hematuria in the setting of a mandatory antiplatelet therapy. He underwent partially successful prostate artery embolization 1 year previously in July of 2023. 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 EXAM: IR EMBO ARTERIAL NOT HEMORR HEMANG INC GUIDE ROADMAPPING;  IR ULTRASOUND GUIDANCE VASC ACCESS RIGHT; PELVIC SELECTIVE ARTERIOGRAPHY MEDICATIONS: Bactrim DS was administered p.o. prior to the procedure. ANESTHESIA/SEDATION: Moderate (conscious) sedation was employed during this procedure. A total of Versed 1.5 mg and Fentanyl 75 mcg was administered intravenously. Moderate Sedation Time: 62 minutes. The patient's level of consciousness and vital signs were monitored continuously by radiology nursing throughout the procedure under my direct supervision. CONTRAST:  <See Chart> OMNIPAQUE IOHEXOL 300 MG/ML SOLN, 45mL OMNIPAQUE IOHEXOL 300 MG/ML SOLN, 45mL OMNIPAQUE IOHEXOL 300 MG/ML SOLN FLUOROSCOPY: Radiation Exposure Index (as provided by the  fluoroscopic device): 426 mGy Kerma COMPLICATIONS: None immediate. PROCEDURE: Informed consent was obtained from the patient following explanation of the procedure, risks, benefits and alternatives. The patient understands, agrees and consents for the procedure. All questions were addressed. A time out was performed prior to the initiation of the procedure. Maximal barrier sterile technique utilized including caps, mask, sterile gowns, sterile gloves, large sterile drape, hand hygiene, and Betadine prep. The right common femoral artery was interrogated with ultrasound and found to be widely patent. An image was obtained and stored for the medical record. Local anesthesia was attained by infiltration with 1% lidocaine. A small dermatotomy was made. Under real-time sonographic guidance, the vessel was punctured with a 21 gauge micropuncture needle. Using standard technique, the initial micro needle was exchanged over a 0.018 micro wire for a transitional 4 [place] micro sheath. The micro sheath was then exchanged over a 0.035 wire for a 5 French vascular sheath. A C2 cobra slip catheter was advanced up in over the aortic bifurcation into the left internal iliac artery. In internal iliac arteriogram was performed. Diminutive anterior  division. No definite recurrent prostatic artery segments. To ensure no significant arterial supply to the left lobe of the prostate artery, a Cook cantata 2.5 French microcatheter was advanced into the anterior division and additional digital subtraction angiography was performed. No significant recurrent prostatic arterialization. A the C2 cobra catheter was brought back into the ipsilateral common iliac artery and with the assistance of a Glidewire used to select the right internal iliac artery. A right internal iliac arteriogram was performed. Prostatic artery is easily visualized arising from the proximal aspect of the obturator artery. To prevent arterial spasm, 100 mcg intra arterial nitroglycerin was administered. The Cook cantata microcatheter was successfully navigated into the prostatic artery. Contrast injection was performed. There is robust filling of the prostate gland. An additional 100 mcg intra arterial nitroglycerin was administered. Repeat contrast injection was performed confirming no significant collateralization or sources of non target embolization. There is atherosclerotic plaque resulting in narrowing of the mid aspect of the prostatic artery. Particle embolization was then performed using 100-300 micron embospheres. Follow-up arteriography demonstrates significant pruning with decreased flow and no significant parenchymal opacification in the prostatic arteries. To prevent recurrence, coil embolization of the prostatic artery was performed using low-profile Ruby detachable microcoils. Final contrast injection demonstrates technical success. The catheters were removed. Hemostasis was attained with the assistance of a Celt arterial closure device. IMPRESSION: 1. Successful combined particle and coil embolization of the previously untreated right prostatic artery. 2. Interrogation of the left prostatic artery demonstrates successful prior embolization without evidence of recurrent  vascularity. Signed, Sterling Big, MD, RPVI [facility] Inter[facility]iology [facility] Radiology Electronically Signed   By: Malachy Moan M.D.   On: 02/16/2023 11:10      LOS: 1 day    Jacquelin Hawking, MD [facility] 02/17/2023, 3:07 PM   If 7PM-7AM, please contact night-coverage [email or web address]

## 2023-02-17 NOTE — Progress Notes (Signed)
Referring Physician(s): Dr. Annabell Howells  Supervising Physician: Malachy Moan  Patient Status:  Nathan [name]  Chief Complaint: History of prostate cancer with recurrent hematuria in the setting of mandatory antiplatelet therapy s/p left prostatic artery embolization July 2023. Patient admitted with hematuria and is now s/p right prostatic artery embolization 02/16/23 by Dr. Archer Asa.   Subjective: Patient in bed watching TV. He has just finished breakfast and he ate everything on his tray. He denies any pain or discomfort. He is hoping he can go home soon but he was told his urine will need to clear up first.   Allergies: Patient has no known allergies.  Medications: Prior to Admission medications   Medication Sig Start Date End Date Taking? Authorizing Provider  clopidogrel (PLAVIX) 75 MG tablet Take 1 tablet by mouth once daily Patient taking differently: Take 75 mg by mouth every other day. 12/18/22  Yes Maeola Harman, MD  ergocalciferol (VITAMIN D2) 1.25 MG (50000 UT) capsule Take 50,000 Units by mouth once a week.   Yes [provider]  ferrous sulfate 325 (65 FE) MG tablet Take 325 mg by mouth daily with breakfast.   Yes [provider]  finasteride (PROSCAR) 5 MG tablet Take 5 mg by mouth daily.   Yes [provider]  folic acid (FOLVITE) 1 MG tablet Take 1 mg by mouth daily. 03/24/19  Yes [provider]  gabapentin (NEURONTIN) 300 MG capsule Take 300 mg by mouth 3 (three) times daily. 10/27/21  Yes [provider]  lisinopril (ZESTRIL) 40 MG tablet Take 40 mg by mouth daily.   Yes [provider]  magnesium oxide (MAG-OX) 400 MG tablet Take 400 mg by mouth daily.  03/24/19  Yes [provider]  metoprolol succinate (TOPROL-XL) 100 MG 24 hr tablet Take 100 mg by mouth daily. 11/14/21  Yes [provider]  Omega-3 Fatty Acids (FISH OIL) 1000 MG CAPS Take 1,000 mg by mouth daily.   Yes [provider]  OVER THE COUNTER MEDICATION Take 1 capsule by mouth daily as needed (Siatic Nerve pain). Sciatiease   Yes [provider]  OVER THE COUNTER MEDICATION Take 1 capsule by mouth daily. Methylate 1 daily   Yes [provider]  Prenatal Vit-Fe Fumarate-FA (PRENATAL MULTIVITAMIN) TABS tablet Take 1 tablet by mouth daily at 12 noon.   Yes [provider]  verapamil (VERELAN PM) 120 MG 24 hr capsule Take 120 mg by mouth 2 (two) times daily. 08/07/22  Yes [provider]     Vital Signs: BP 114/71 (BP Location: Right Arm)   Pulse 94   Temp 98 F (36.7 C) (Oral)   Resp 18   Ht 6\' 1"  (1.854 m)   Wt 170 lb (77.1 kg)   SpO2 95%   BMI 22.43 kg/m   Physical Exam Constitutional:      General: He is not in acute distress.    Appearance: He is not ill-appearing.  Cardiovascular:     Comments: Right groin vascular site is clean, soft, dry and non-tender.  Pulmonary:     Effort: Pulmonary effort is normal.  Abdominal:     Tenderness: There is no abdominal tenderness.  Genitourinary:    Comments: SPT to gravity bag. Urine is blood-tinged.  Skin:    General: Skin is warm and dry.  Neurological:     Mental Status: He is alert and oriented to person, place, and time.  Psychiatric:  Mood and Affect: Mood normal.        Behavior: Behavior normal.        Thought Content: Thought content normal.        Judgment: Judgment normal.     Imaging: IR Angiogram Pelvis Selective Or Supraselective  Result Date: 02/16/2023 INDICATION: 77 year old male with benign prostatic hyperplasia and recurrent hematuria in the setting of a mandatory antiplatelet therapy. He underwent partially successful prostate artery embolization 1 year previously in July of 2023. 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 EXAM: IR EMBO ARTERIAL NOT HEMORR HEMANG INC GUIDE ROADMAPPING; IR ULTRASOUND GUIDANCE  VASC ACCESS RIGHT; PELVIC SELECTIVE ARTERIOGRAPHY MEDICATIONS: Bactrim DS was administered p.o. prior to the procedure. ANESTHESIA/SEDATION: Moderate (conscious) sedation was employed during this procedure. A total of Versed 1.5 mg and Fentanyl 75 mcg was administered intravenously. Moderate Sedation Time: 62 minutes. The patient's level of consciousness and vital signs were monitored continuously by radiology nursing throughout the procedure under my direct supervision. CONTRAST:  <See Chart> OMNIPAQUE IOHEXOL 300 MG/ML SOLN, 45mL OMNIPAQUE IOHEXOL 300 MG/ML SOLN, 45mL OMNIPAQUE IOHEXOL 300 MG/ML SOLN FLUOROSCOPY: Radiation Exposure Index (as provided by the fluoroscopic device): 426 mGy Kerma COMPLICATIONS: None immediate. PROCEDURE: Informed consent was obtained from the patient following explanation of the procedure, risks, benefits and alternatives. The patient understands, agrees and consents for the procedure. All questions were addressed. A time out was performed prior to the initiation of the procedure. Maximal barrier sterile technique utilized including caps, mask, sterile gowns, sterile gloves, large sterile drape, hand hygiene, and Betadine prep. The right common femoral artery was interrogated with ultrasound and found to be widely patent. An image was obtained and stored for the medical record. Local anesthesia was attained by infiltration with 1% lidocaine. A small dermatotomy was made. Under real-time sonographic guidance, the vessel was punctured with a 21 gauge micropuncture needle. Using standard technique, the initial micro needle was exchanged over a 0.018 micro wire for a transitional 4 [place] micro sheath. The micro sheath was then exchanged over a 0.035 wire for a 5 French vascular sheath. A C2 cobra slip catheter was advanced up in over the aortic bifurcation into the left internal iliac artery. In internal iliac arteriogram was performed. Diminutive anterior division. No definite recurrent  prostatic artery segments. To ensure no significant arterial supply to the left lobe of the prostate artery, a Cook cantata 2.5 French microcatheter was advanced into the anterior division and additional digital subtraction angiography was performed. No significant recurrent prostatic arterialization. A the C2 cobra catheter was brought back into the ipsilateral common iliac artery and with the assistance of a Glidewire used to select the right internal iliac artery. A right internal iliac arteriogram was performed. Prostatic artery is easily visualized arising from the proximal aspect of the obturator artery. To prevent arterial spasm, 100 mcg intra arterial nitroglycerin was administered. The Cook cantata microcatheter was successfully navigated into the prostatic artery. Contrast injection was performed. There is robust filling of the prostate gland. An additional 100 mcg intra arterial nitroglycerin was administered. Repeat contrast injection was performed confirming no significant collateralization or sources of non target embolization. There is atherosclerotic plaque resulting in narrowing of the mid aspect of the prostatic artery. Particle embolization was then performed using 100-300 micron embospheres. Follow-up arteriography demonstrates significant pruning with decreased flow and no significant parenchymal opacification in the prostatic arteries. To prevent recurrence, coil embolization of the prostatic artery was performed  using low-profile Ruby detachable microcoils. Final contrast injection demonstrates technical success. The catheters were removed. Hemostasis was attained with the assistance of a Celt arterial closure device. IMPRESSION: 1. Successful combined particle and coil embolization of the previously untreated right prostatic artery. 2. Interrogation of the left prostatic artery demonstrates successful prior embolization without evidence of recurrent vascularity. Signed, Sterling Big,  MD, RPVI [facility] Radiology Electronically Signed   By: Malachy Moan M.D.   On: 02/16/2023 11:10   IR Angiogram Pelvis Selective Or Supraselective  Result Date: 02/16/2023 INDICATION: 77 year old male with benign prostatic hyperplasia and recurrent hematuria in the setting of a mandatory antiplatelet therapy. He underwent partially successful prostate artery embolization 1 year previously in July of 2023. 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 EXAM: IR EMBO ARTERIAL NOT HEMORR HEMANG INC GUIDE ROADMAPPING; IR ULTRASOUND GUIDANCE VASC ACCESS RIGHT; PELVIC SELECTIVE ARTERIOGRAPHY MEDICATIONS: Bactrim DS was administered p.o. prior to the procedure. ANESTHESIA/SEDATION: Moderate (conscious) sedation was employed during this procedure. A total of Versed 1.5 mg and Fentanyl 75 mcg was administered intravenously. Moderate Sedation Time: 62 minutes. The patient's level of consciousness and vital signs were monitored continuously by radiology nursing throughout the procedure under my direct supervision. CONTRAST:  <See Chart> OMNIPAQUE IOHEXOL 300 MG/ML SOLN, 45mL OMNIPAQUE IOHEXOL 300 MG/ML SOLN, 45mL OMNIPAQUE IOHEXOL 300 MG/ML SOLN FLUOROSCOPY: Radiation Exposure Index (as provided by the fluoroscopic device): 426 mGy Kerma COMPLICATIONS: None immediate. PROCEDURE: Informed consent was obtained from the patient following explanation of the procedure, risks, benefits and alternatives. The patient understands, agrees and consents for the procedure. All questions were addressed. A time out was performed prior to the initiation of the procedure. Maximal barrier sterile technique utilized including caps, mask, sterile gowns, sterile gloves, large sterile drape, hand hygiene, and Betadine prep. The right common femoral artery was interrogated with ultrasound and found to be widely patent. An  image was obtained and stored for the medical record. Local anesthesia was attained by infiltration with 1% lidocaine. A small dermatotomy was made. Under real-time sonographic guidance, the vessel was punctured with a 21 gauge micropuncture needle. Using standard technique, the initial micro needle was exchanged over a 0.018 micro wire for a transitional 4 [place] micro sheath. The micro sheath was then exchanged over a 0.035 wire for a 5 French vascular sheath. A C2 cobra slip catheter was advanced up in over the aortic bifurcation into the left internal iliac artery. In internal iliac arteriogram was performed. Diminutive anterior division. No definite recurrent prostatic artery segments. To ensure no significant arterial supply to the left lobe of the prostate artery, a Cook cantata 2.5 French microcatheter was advanced into the anterior division and additional digital subtraction angiography was performed. No significant recurrent prostatic arterialization. A the C2 cobra catheter was brought back into the ipsilateral common iliac artery and with the assistance of a Glidewire used to select the right internal iliac artery. A right internal iliac arteriogram was performed. Prostatic artery is easily visualized arising from the proximal aspect of the obturator artery. To prevent arterial spasm, 100 mcg intra arterial nitroglycerin was administered. The Cook cantata microcatheter was successfully navigated into the prostatic artery. Contrast injection was performed. There is robust filling of the prostate gland. An additional 100 mcg intra arterial nitroglycerin was administered. Repeat contrast injection was performed confirming no significant collateralization or sources of non target embolization. There is atherosclerotic plaque resulting in narrowing  of the mid aspect of the prostatic artery. Particle embolization was then performed using 100-300 micron embospheres. Follow-up arteriography demonstrates  significant pruning with decreased flow and no significant parenchymal opacification in the prostatic arteries. To prevent recurrence, coil embolization of the prostatic artery was performed using low-profile Ruby detachable microcoils. Final contrast injection demonstrates technical success. The catheters were removed. Hemostasis was attained with the assistance of a Celt arterial closure device. IMPRESSION: 1. Successful combined particle and coil embolization of the previously untreated right prostatic artery. 2. Interrogation of the left prostatic artery demonstrates successful prior embolization without evidence of recurrent vascularity. Signed, Sterling Big, MD, RPVI [facility] Radiology Electronically Signed   By: Malachy Moan M.D.   On: 02/16/2023 11:10   IR EMBO ARTERIAL NOT HEMORR HEMANG INC GUIDE ROADMAPPING  Result Date: 02/16/2023 INDICATION: 77 year old male with benign prostatic hyperplasia and recurrent hematuria in the setting of a mandatory antiplatelet therapy. He underwent partially successful prostate artery embolization 1 year previously in July of 2023. 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 EXAM: IR EMBO ARTERIAL NOT HEMORR HEMANG INC GUIDE ROADMAPPING; IR ULTRASOUND GUIDANCE VASC ACCESS RIGHT; PELVIC SELECTIVE ARTERIOGRAPHY MEDICATIONS: Bactrim DS was administered p.o. prior to the procedure. ANESTHESIA/SEDATION: Moderate (conscious) sedation was employed during this procedure. A total of Versed 1.5 mg and Fentanyl 75 mcg was administered intravenously. Moderate Sedation Time: 62 minutes. The patient's level of consciousness and vital signs were monitored continuously by radiology nursing throughout the procedure under my direct supervision. CONTRAST:  <See Chart> OMNIPAQUE IOHEXOL 300 MG/ML SOLN, 45mL OMNIPAQUE IOHEXOL 300 MG/ML SOLN, 45mL OMNIPAQUE  IOHEXOL 300 MG/ML SOLN FLUOROSCOPY: Radiation Exposure Index (as provided by the fluoroscopic device): 426 mGy Kerma COMPLICATIONS: None immediate. PROCEDURE: Informed consent was obtained from the patient following explanation of the procedure, risks, benefits and alternatives. The patient understands, agrees and consents for the procedure. All questions were addressed. A time out was performed prior to the initiation of the procedure. Maximal barrier sterile technique utilized including caps, mask, sterile gowns, sterile gloves, large sterile drape, hand hygiene, and Betadine prep. The right common femoral artery was interrogated with ultrasound and found to be widely patent. An image was obtained and stored for the medical record. Local anesthesia was attained by infiltration with 1% lidocaine. A small dermatotomy was made. Under real-time sonographic guidance, the vessel was punctured with a 21 gauge micropuncture needle. Using standard technique, the initial micro needle was exchanged over a 0.018 micro wire for a transitional 4 [place] micro sheath. The micro sheath was then exchanged over a 0.035 wire for a 5 French vascular sheath. A C2 cobra slip catheter was advanced up in over the aortic bifurcation into the left internal iliac artery. In internal iliac arteriogram was performed. Diminutive anterior division. No definite recurrent prostatic artery segments. To ensure no significant arterial supply to the left lobe of the prostate artery, a Cook cantata 2.5 French microcatheter was advanced into the anterior division and additional digital subtraction angiography was performed. No significant recurrent prostatic arterialization. A the C2 cobra catheter was brought back into the ipsilateral common iliac artery and with the assistance of a Glidewire used to select the right internal iliac artery. A right internal iliac arteriogram was performed. Prostatic artery is easily visualized arising from the proximal  aspect of the obturator artery. To prevent arterial spasm, 100 mcg intra arterial nitroglycerin was administered. [facility] cantata microcatheter was  successfully navigated into the prostatic artery. Contrast injection was performed. There is robust filling of the prostate gland. An additional 100 mcg intra arterial nitroglycerin was administered. Repeat contrast injection was performed confirming no significant collateralization or sources of non target embolization. There is atherosclerotic plaque resulting in narrowing of the mid aspect of the prostatic artery. Particle embolization was then performed using 100-300 micron embospheres. Follow-up arteriography demonstrates significant pruning with decreased flow and no significant parenchymal opacification in the prostatic arteries. To prevent recurrence, coil embolization of the prostatic artery was performed using low-profile Ruby detachable microcoils. Final contrast injection demonstrates technical success. The catheters were removed. Hemostasis was attained with the assistance of a Celt arterial closure device. IMPRESSION: 1. Successful combined particle and coil embolization of the previously untreated right prostatic artery. 2. Interrogation of the left prostatic artery demonstrates successful prior embolization without evidence of recurrent vascularity. Signed, Sterling Big, MD, RPVI [facility] Radiology Electronically Signed   By: Malachy Moan M.D.   On: 02/16/2023 11:10   IR US Guide Vasc Access Right  Result Date: 02/16/2023 INDICATION: 77 year old male with benign prostatic hyperplasia and recurrent hematuria in the setting of a mandatory antiplatelet therapy. He underwent partially successful prostate artery embolization 1 year previously in July of 2023. 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 EXAM: IR EMBO ARTERIAL  NOT HEMORR HEMANG INC GUIDE ROADMAPPING; IR ULTRASOUND GUIDANCE VASC ACCESS RIGHT; PELVIC SELECTIVE ARTERIOGRAPHY MEDICATIONS: Bactrim DS was administered p.o. prior to the procedure. ANESTHESIA/SEDATION: Moderate (conscious) sedation was employed during this procedure. A total of Versed 1.5 mg and Fentanyl 75 mcg was administered intravenously. Moderate Sedation Time: 62 minutes. The patient's level of consciousness and vital signs were monitored continuously by radiology nursing throughout the procedure under my direct supervision. CONTRAST:  <See Chart> OMNIPAQUE IOHEXOL 300 MG/ML SOLN, 45mL OMNIPAQUE IOHEXOL 300 MG/ML SOLN, 45mL OMNIPAQUE IOHEXOL 300 MG/ML SOLN FLUOROSCOPY: Radiation Exposure Index (as provided by the fluoroscopic device): 426 mGy Kerma COMPLICATIONS: None immediate. PROCEDURE: Informed consent was obtained from the patient following explanation of the procedure, risks, benefits and alternatives. The patient understands, agrees and consents for the procedure. All questions were addressed. A time out was performed prior to the initiation of the procedure. Maximal barrier sterile technique utilized including caps, mask, sterile gowns, sterile gloves, large sterile drape, hand hygiene, and Betadine prep. The right common femoral artery was interrogated with ultrasound and found to be widely patent. An image was obtained and stored for the medical record. Local anesthesia was attained by infiltration with 1% lidocaine. A small dermatotomy was made. Under real-time sonographic guidance, the vessel was punctured with a 21 gauge micropuncture needle. Using standard technique, the initial micro needle was exchanged over a 0.018 micro wire for a transitional 4 [place] micro sheath. The micro sheath was then exchanged over a 0.035 wire for a 5 French vascular sheath. A C2 cobra slip catheter was advanced up in over the aortic bifurcation into the left internal iliac artery. In internal iliac arteriogram  was performed. Diminutive anterior division. No definite recurrent prostatic artery segments. To ensure no significant arterial supply to the left lobe of the prostate artery, a Cook cantata 2.5 French microcatheter was advanced into the anterior division and additional digital subtraction angiography was performed. No significant recurrent prostatic arterialization. A the C2 cobra catheter was brought back into the ipsilateral common iliac artery and with the assistance of a Glidewire  used to select the right internal iliac artery. A right internal iliac arteriogram was performed. Prostatic artery is easily visualized arising from the proximal aspect of the obturator artery. To prevent arterial spasm, 100 mcg intra arterial nitroglycerin was administered. The Cook cantata microcatheter was successfully navigated into the prostatic artery. Contrast injection was performed. There is robust filling of the prostate gland. An additional 100 mcg intra arterial nitroglycerin was administered. Repeat contrast injection was performed confirming no significant collateralization or sources of non target embolization. There is atherosclerotic plaque resulting in narrowing of the mid aspect of the prostatic artery. Particle embolization was then performed using 100-300 micron embospheres. Follow-up arteriography demonstrates significant pruning with decreased flow and no significant parenchymal opacification in the prostatic arteries. To prevent recurrence, coil embolization of the prostatic artery was performed using low-profile Ruby detachable microcoils. Final contrast injection demonstrates technical success. The catheters were removed. Hemostasis was attained with the assistance of a Celt arterial closure device. IMPRESSION: 1. Successful combined particle and coil embolization of the previously untreated right prostatic artery. 2. Interrogation of the left prostatic artery demonstrates successful prior embolization  without evidence of recurrent vascularity. Signed, Sterling Big, MD, RPVI [facility] Radiology Electronically Signed   By: Malachy Moan M.D.   On: 02/16/2023 11:10    Labs:  CBC: Recent Labs    09/29/22 1800 02/15/23 0919 02/16/23 0603 02/16/23 1237 02/16/23 2137 02/17/23 0520  WBC 4.2 6.4 5.7  --   --  7.1  HGB 11.9* 6.5* 7.6* 9.4* 8.0* 7.8*  HCT 38.0* 21.9* 24.9* 31.1* 25.7* 25.3*  PLT 219 293 256  --   --  262    COAGS: Recent Labs    02/27/22 1420 09/29/22 1800 02/15/23 0919  INR 1.2 1.2 1.1  APTT 29  --   --     BMP: Recent Labs    09/18/22 0219 09/29/22 1800 02/15/23 0919 02/16/23 0603  NA 138 136 138 142  K 3.8 3.3* 3.8 4.3  CL 107 105 107 109  CO2 23 24 25 26   GLUCOSE 110* 87 95 85  BUN 11 13 12 11   CALCIUM 8.7* 8.7* 8.6* 8.3*  CREATININE 1.05 0.96 1.20 1.15  GFRNONAA >60 >60 >60 >60    LIVER FUNCTION TESTS: Recent Labs    02/27/22 1420 09/18/22 0219 09/29/22 1800 02/15/23 0919  BILITOT 0.5 0.7 0.7 0.4  AST 33 17 16 14*  ALT 13 11 20 12   ALKPHOS 51 51 61 54  PROT 6.3* 5.9* 6.9 6.6  ALBUMIN 3.1* 3.1* 3.6 3.5    Assessment and Plan:  History of prostate cancer with recurrent hematuria in the setting of mandatory antiplatelet therapy s/p left prostatic artery embolization July 2023. Patient admitted with hematuria and is now s/p right prostatic artery embolization 02/16/23 by Dr. Archer Asa.   Hemoglobin is stable compared to yesterday. Urine is still blood -tinged with some clots/debris but no signs of continued bleeding. Right groin vascular site is clean, soft, dry and non-tender. The dressing may be removed this afternoon.   Please contact IR with any questions/concerns.   Electronically Signed: Alwyn Ren, AGACNP-BC [phone] 02/17/2023, 10:07 AM   I spent a total of 15 Minutes at the the patient's bedside AND on the patient's hospital floor or unit, greater than  50% of which was counseling/coordinating care s/p right prostatic artery embolization.

## 2023-02-17 NOTE — Plan of Care (Signed)
  Problem: Education: Goal: Knowledge of General Education information will improve Description: Including pain rating scale, medication(s)/side effects and non-pharmacologic comfort measures Outcome: Progressing   Problem: Health Behavior/Discharge Planning: Goal: Ability to manage health-related needs will improve Outcome: Progressing   Problem: Clinical Measurements: Goal: Ability to maintain clinical measurements within normal limits will improve Outcome: Progressing Goal: Will remain free from infection Outcome: Progressing Goal: Diagnostic test results will improve Outcome: Progressing Goal: Respiratory complications will improve Outcome: Progressing Goal: Cardiovascular complication will be avoided Outcome: Progressing   Problem: Health Behavior/Discharge Planning: Goal: Ability to safely manage health-related needs after discharge will improve Outcome: Progressing   Problem: Cardiovascular: Goal: Ability to achieve and maintain adequate cardiovascular perfusion will improve Outcome: Progressing Goal: Vascular access site(s) Level 0-1 will be maintained Outcome: Progressing   Problem: Activity: Goal: Ability to return to baseline activity level will improve Outcome: Progressing

## 2023-02-17 NOTE — Progress Notes (Signed)
Subjective: His urine remains bloody.  He has no complaints.  His Hgb is up to 7.6 after a transfusion.  He is scheduled for an attempt a right prostatic artery embolization.   ROS:  Review of Systems  All other systems reviewed and are negative.   Anti-infectives: Anti-infectives (From admission, onward)    Start     Dose/Rate Route Frequency Ordered Stop   02/16/23 2000  ciprofloxacin (CIPRO) tablet 500 mg        500 mg Oral 2 times daily 02/16/23 1713 02/21/23 1959   02/16/23 0800  sulfamethoxazole-trimethoprim (BACTRIM) 400-80 MG per tablet 1 tablet        1 tablet Oral  Once 02/15/23 1626 02/16/23 0909       Current Facility-Administered Medications  Medication Dose Route Frequency Provider Last Rate Last Admin   acetaminophen (TYLENOL) tablet 650 mg  650 mg Oral Q6H PRN Kirby Crigler, Mir M, MD       Or   acetaminophen (TYLENOL) suppository 650 mg  650 mg Rectal Q6H PRN Kirby Crigler, Mir M, MD       albuterol (PROVENTIL) (2.5 MG/3ML) 0.083% nebulizer solution 2.5 mg  2.5 mg Nebulization Q2H PRN Kirby Crigler, Mir M, MD       ciprofloxacin (CIPRO) tablet 500 mg  500 mg Oral BID Narda Bonds, MD   500 mg at 02/16/23 2115   feeding supplement (ENSURE ENLIVE / ENSURE PLUS) liquid 237 mL  237 mL Oral BID BM Narda Bonds, MD       ferrous sulfate tablet 325 mg  325 mg Oral Q breakfast Kirby Crigler, Mir M, MD   325 mg at 02/16/23 0102   gabapentin (NEURONTIN) capsule 300 mg  300 mg Oral TID Kirby Crigler, Mir M, MD   300 mg at 02/16/23 2115   iohexol (OMNIPAQUE) 300 MG/ML solution 100 mL  100 mL Intravenous Once PRN Sterling Big, MD       ondansetron [facility]) tablet 4 mg  4 mg Oral Q6H PRN Kirby Crigler, Mir M, MD       Or   ondansetron [facility]) injection 4 mg  4 mg Intravenous Q6H PRN Kirby Crigler, Mir M, MD       oxyCODONE (Oxy IR/ROXICODONE) immediate release tablet 5 mg  5 mg Oral Q6H PRN Kirby Crigler, Mir M, MD       traZODone (DESYREL) tablet 25 mg  25 mg Oral QHS PRN Kirby Crigler,  Mir M, MD         Objective: Vital signs in last 24 hours: Temp:  [97.8 F (36.6 C)-98.7 F (37.1 C)] 98 F (36.7 C) (08/18 0448) Pulse Rate:  [63-94] 94 (08/18 0448) Resp:  [10-18] 18 (08/18 0448) BP: (114-157)/(61-89) 114/71 (08/18 0448) SpO2:  [95 %-100 %] 95 % (08/18 0448)  Intake/Output from previous day: 08/17 0701 - 08/18 0700 In: -  Out: 3101 [Urine:3100; Stool:1] Intake/Output this shift: No intake/output data recorded.   Physical Exam Vitals reviewed.  Constitutional:      Appearance: Normal appearance.  Genitourinary:    Comments: Urine is dark red without clots in foley tubing.  Neurological:     Mental Status: He is alert.     Lab Results:  Recent Labs    02/16/23 0603 02/16/23 1237 02/16/23 2137 02/17/23 0520  WBC 5.7  --   --  7.1  HGB 7.6*   < > 8.0* 7.8*  HCT 24.9*   < > 25.7* 25.3*  PLT 256  --   --  262   < > =  values in this interval not displayed.   BMET Recent Labs    02/15/23 0919 02/16/23 0603  NA 138 142  K 3.8 4.3  CL 107 109  CO2 25 26  GLUCOSE 95 85  BUN 12 11  CREATININE 1.20 1.15  CALCIUM 8.6* 8.3*   PT/INR Recent Labs    02/15/23 0919  LABPROT 14.7  INR 1.1   ABG No results for input(s): "PHART", "HCO3" in the last 72 hours.  Invalid input(s): "PCO2", "PO2"  Studies/Results: IR Angiogram Pelvis Selective Or Supraselective  Result Date: 02/16/2023 INDICATION: 77 year old male with benign prostatic hyperplasia and recurrent hematuria in the setting of a mandatory antiplatelet therapy. He underwent partially successful prostate artery embolization 1 year previously in July of 2023. 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 EXAM: IR EMBO ARTERIAL NOT HEMORR HEMANG INC GUIDE ROADMAPPING; IR ULTRASOUND GUIDANCE VASC ACCESS RIGHT; PELVIC SELECTIVE ARTERIOGRAPHY MEDICATIONS: Bactrim DS was administered p.o. prior to the procedure.  ANESTHESIA/SEDATION: Moderate (conscious) sedation was employed during this procedure. A total of Versed 1.5 mg and Fentanyl 75 mcg was administered intravenously. Moderate Sedation Time: 62 minutes. The patient's level of consciousness and vital signs were monitored continuously by radiology nursing throughout the procedure under my direct supervision. CONTRAST:  <See Chart> OMNIPAQUE IOHEXOL 300 MG/ML SOLN, 45mL OMNIPAQUE IOHEXOL 300 MG/ML SOLN, 45mL OMNIPAQUE IOHEXOL 300 MG/ML SOLN FLUOROSCOPY: Radiation Exposure Index (as provided by the fluoroscopic device): 426 mGy Kerma COMPLICATIONS: None immediate. PROCEDURE: Informed consent was obtained from the patient following explanation of the procedure, risks, benefits and alternatives. The patient understands, agrees and consents for the procedure. All questions were addressed. A time out was performed prior to the initiation of the procedure. Maximal barrier sterile technique utilized including caps, mask, sterile gowns, sterile gloves, large sterile drape, hand hygiene, and Betadine prep. The right common femoral artery was interrogated with ultrasound and found to be widely patent. An image was obtained and stored for the medical record. Local anesthesia was attained by infiltration with 1% lidocaine. A small dermatotomy was made. Under real-time sonographic guidance, the vessel was punctured with a 21 gauge micropuncture needle. Using standard technique, the initial micro needle was exchanged over a 0.018 micro wire for a transitional 4 [place] micro sheath. The micro sheath was then exchanged over a 0.035 wire for a 5 French vascular sheath. A C2 cobra slip catheter was advanced up in over the aortic bifurcation into the left internal iliac artery. In internal iliac arteriogram was performed. Diminutive anterior division. No definite recurrent prostatic artery segments. To ensure no significant arterial supply to the left lobe of the prostate artery, a Cook  cantata 2.5 French microcatheter was advanced into the anterior division and additional digital subtraction angiography was performed. No significant recurrent prostatic arterialization. A the C2 cobra catheter was brought back into the ipsilateral common iliac artery and with the assistance of a Glidewire used to select the right internal iliac artery. A right internal iliac arteriogram was performed. Prostatic artery is easily visualized arising from the proximal aspect of the obturator artery. To prevent arterial spasm, 100 mcg intra arterial nitroglycerin was administered. The Cook cantata microcatheter was successfully navigated into the prostatic artery. Contrast injection was performed. There is robust filling of the prostate gland. An additional 100 mcg intra arterial nitroglycerin was administered. Repeat contrast injection was performed confirming no significant collateralization or sources of non target embolization. There is atherosclerotic plaque resulting in narrowing  of the mid aspect of the prostatic artery. Particle embolization was then performed using 100-300 micron embospheres. Follow-up arteriography demonstrates significant pruning with decreased flow and no significant parenchymal opacification in the prostatic arteries. To prevent recurrence, coil embolization of the prostatic artery was performed using low-profile Ruby detachable microcoils. Final contrast injection demonstrates technical success. The catheters were removed. Hemostasis was attained with the assistance of a Celt arterial closure device. IMPRESSION: 1. Successful combined particle and coil embolization of the previously untreated right prostatic artery. 2. Interrogation of the left prostatic artery demonstrates successful prior embolization without evidence of recurrent vascularity. Signed, Sterling Big, MD, RPVI [facility] Radiology Electronically Signed   By: Malachy Moan M.D.   On: 02/16/2023 11:10   IR Angiogram Pelvis Selective Or Supraselective  Result Date: 02/16/2023 INDICATION: 77 year old male with benign prostatic hyperplasia and recurrent hematuria in the setting of a mandatory antiplatelet therapy. He underwent partially successful prostate artery embolization 1 year previously in July of 2023. 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 EXAM: IR EMBO ARTERIAL NOT HEMORR HEMANG INC GUIDE ROADMAPPING; IR ULTRASOUND GUIDANCE VASC ACCESS RIGHT; PELVIC SELECTIVE ARTERIOGRAPHY MEDICATIONS: Bactrim DS was administered p.o. prior to the procedure. ANESTHESIA/SEDATION: Moderate (conscious) sedation was employed during this procedure. A total of Versed 1.5 mg and Fentanyl 75 mcg was administered intravenously. Moderate Sedation Time: 62 minutes. The patient's level of consciousness and vital signs were monitored continuously by radiology nursing throughout the procedure under my direct supervision. CONTRAST:  <See Chart> OMNIPAQUE IOHEXOL 300 MG/ML SOLN, 45mL OMNIPAQUE IOHEXOL 300 MG/ML SOLN, 45mL OMNIPAQUE IOHEXOL 300 MG/ML SOLN FLUOROSCOPY: Radiation Exposure Index (as provided by the fluoroscopic device): 426 mGy Kerma COMPLICATIONS: None immediate. PROCEDURE: Informed consent was obtained from the patient following explanation of the procedure, risks, benefits and alternatives. The patient understands, agrees and consents for the procedure. All questions were addressed. A time out was performed prior to the initiation of the procedure. Maximal barrier sterile technique utilized including caps, mask, sterile gowns, sterile gloves, large sterile drape, hand hygiene, and Betadine prep. The right common femoral artery was interrogated with ultrasound and found to be widely patent. An image was obtained and stored for the medical record. Local anesthesia was attained by infiltration with 1%  lidocaine. A small dermatotomy was made. Under real-time sonographic guidance, the vessel was punctured with a 21 gauge micropuncture needle. Using standard technique, the initial micro needle was exchanged over a 0.018 micro wire for a transitional 4 [place] micro sheath. The micro sheath was then exchanged over a 0.035 wire for a 5 French vascular sheath. A C2 cobra slip catheter was advanced up in over the aortic bifurcation into the left internal iliac artery. In internal iliac arteriogram was performed. Diminutive anterior division. No definite recurrent prostatic artery segments. To ensure no significant arterial supply to the left lobe of the prostate artery, a Cook cantata 2.5 French microcatheter was advanced into the anterior division and additional digital subtraction angiography was performed. No significant recurrent prostatic arterialization. A the C2 cobra catheter was brought back into the ipsilateral common iliac artery and with the assistance of a Glidewire used to select the right internal iliac artery. A right internal iliac arteriogram was performed. Prostatic artery is easily visualized arising from the proximal aspect of the obturator artery. To prevent arterial spasm, 100 mcg intra arterial nitroglycerin was administered. The Cook cantata microcatheter was successfully navigated into the  prostatic artery. Contrast injection was performed. There is robust filling of the prostate gland. An additional 100 mcg intra arterial nitroglycerin was administered. Repeat contrast injection was performed confirming no significant collateralization or sources of non target embolization. There is atherosclerotic plaque resulting in narrowing of the mid aspect of the prostatic artery. Particle embolization was then performed using 100-300 micron embospheres. Follow-up arteriography demonstrates significant pruning with decreased flow and no significant parenchymal opacification in the prostatic arteries. To  prevent recurrence, coil embolization of the prostatic artery was performed using low-profile Ruby detachable microcoils. Final contrast injection demonstrates technical success. The catheters were removed. Hemostasis was attained with the assistance of a Celt arterial closure device. IMPRESSION: 1. Successful combined particle and coil embolization of the previously untreated right prostatic artery. 2. Interrogation of the left prostatic artery demonstrates successful prior embolization without evidence of recurrent vascularity. Signed, Sterling Big, MD, RPVI [facility] Inter[facility] Radiology [facility] Radiology Electronically Signed   By: Malachy Moan M.D.   On: 02/16/2023 11:10   IR EMBO ARTERIAL NOT HEMORR HEMANG INC GUIDE ROADMAPPING  Result Date: 02/16/2023 INDICATION: 77 year old male with benign prostatic hyperplasia and recurrent hematuria in the setting of a mandatory antiplatelet therapy. He underwent partially successful prostate artery embolization 1 year previously in July of 2023. 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 EXAM: IR EMBO ARTERIAL NOT HEMORR HEMANG INC GUIDE ROADMAPPING; IR ULTRASOUND GUIDANCE VASC ACCESS RIGHT; PELVIC SELECTIVE ARTERIOGRAPHY MEDICATIONS: Bactrim DS was administered p.o. prior to the procedure. ANESTHESIA/SEDATION: Moderate (conscious) sedation was employed during this procedure. A total of Versed 1.5 mg and Fentanyl 75 mcg was administered intravenously. Moderate Sedation Time: 62 minutes. The patient's level of consciousness and vital signs were monitored continuously by radiology nursing throughout the procedure under my direct supervision. CONTRAST:  <See Chart> OMNIPAQUE IOHEXOL 300 MG/ML SOLN, 45mL OMNIPAQUE IOHEXOL 300 MG/ML SOLN, 45mL OMNIPAQUE IOHEXOL 300 MG/ML SOLN FLUOROSCOPY: Radiation Exposure Index (as provided by the fluoroscopic device): 426 mGy Kerma  COMPLICATIONS: None immediate. PROCEDURE: Informed consent was obtained from the patient following explanation of the procedure, risks, benefits and alternatives. The patient understands, agrees and consents for the procedure. All questions were addressed. A time out was performed prior to the initiation of the procedure. Maximal barrier sterile technique utilized including caps, mask, sterile gowns, sterile gloves, large sterile drape, hand hygiene, and Betadine prep. The right common femoral artery was interrogated with ultrasound and found to be widely patent. An image was obtained and stored for the medical record. Local anesthesia was attained by infiltration with 1% lidocaine. A small dermatotomy was made. Under real-time sonographic guidance, the vessel was punctured with a 21 gauge micropuncture needle. Using standard technique, the initial micro needle was exchanged over a 0.018 micro wire for a transitional 4 [place] micro sheath. The micro sheath was then exchanged over a 0.035 wire for a 5 French vascular sheath. A C2 cobra slip catheter was advanced up in over the aortic bifurcation into the left internal iliac artery. In internal iliac arteriogram was performed. Diminutive anterior division. No definite recurrent prostatic artery segments. To ensure no significant arterial supply to the left lobe of the prostate artery, a Cook cantata 2.5 French microcatheter was advanced into the anterior division and additional digital subtraction angiography was performed. No significant recurrent prostatic arterialization. A the C2 cobra catheter was brought back into the ipsilateral common iliac artery and with the assistance of a Glidewire used  to select the right internal iliac artery. A right internal iliac arteriogram was performed. Prostatic artery is easily visualized arising from the proximal aspect of the obturator artery. To prevent arterial spasm, 100 mcg intra arterial nitroglycerin was administered. The  Cook cantata microcatheter was successfully navigated into the prostatic artery. Contrast injection was performed. There is robust filling of the prostate gland. An additional 100 mcg intra arterial nitroglycerin was administered. Repeat contrast injection was performed confirming no significant collateralization or sources of non target embolization. There is atherosclerotic plaque resulting in narrowing of the mid aspect of the prostatic artery. Particle embolization was then performed using 100-300 micron embospheres. Follow-up arteriography demonstrates significant pruning with decreased flow and no significant parenchymal opacification in the prostatic arteries. To prevent recurrence, coil embolization of the prostatic artery was performed using low-profile Ruby detachable microcoils. Final contrast injection demonstrates technical success. The catheters were removed. Hemostasis was attained with the assistance of a Celt arterial closure device. IMPRESSION: 1. Successful combined particle and coil embolization of the previously untreated right prostatic artery. 2. Interrogation of the left prostatic artery demonstrates successful prior embolization without evidence of recurrent vascularity. Signed, Sterling Big, MD, RPVI [facility] Radiology Electronically Signed   By: Malachy Moan M.D.   On: 02/16/2023 11:10   IR US Guide Vasc Access Right  Result Date: 02/16/2023 INDICATION: 77 year old male with benign prostatic hyperplasia and recurrent hematuria in the setting of a mandatory antiplatelet therapy. He underwent partially successful prostate artery embolization 1 year previously in July of 2023. 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 EXAM: IR EMBO ARTERIAL NOT HEMORR HEMANG INC GUIDE ROADMAPPING; IR ULTRASOUND GUIDANCE VASC ACCESS RIGHT; PELVIC SELECTIVE ARTERIOGRAPHY  MEDICATIONS: Bactrim DS was administered p.o. prior to the procedure. ANESTHESIA/SEDATION: Moderate (conscious) sedation was employed during this procedure. A total of Versed 1.5 mg and Fentanyl 75 mcg was administered intravenously. Moderate Sedation Time: 62 minutes. The patient's level of consciousness and vital signs were monitored continuously by radiology nursing throughout the procedure under my direct supervision. CONTRAST:  <See Chart> OMNIPAQUE IOHEXOL 300 MG/ML SOLN, 45mL OMNIPAQUE IOHEXOL 300 MG/ML SOLN, 45mL OMNIPAQUE IOHEXOL 300 MG/ML SOLN FLUOROSCOPY: Radiation Exposure Index (as provided by the fluoroscopic device): 426 mGy Kerma COMPLICATIONS: None immediate. PROCEDURE: Informed consent was obtained from the patient following explanation of the procedure, risks, benefits and alternatives. The patient understands, agrees and consents for the procedure. All questions were addressed. A time out was performed prior to the initiation of the procedure. Maximal barrier sterile technique utilized including caps, mask, sterile gowns, sterile gloves, large sterile drape, hand hygiene, and Betadine prep. The right common femoral artery was interrogated with ultrasound and found to be widely patent. An image was obtained and stored for the medical record. Local anesthesia was attained by infiltration with 1% lidocaine. A small dermatotomy was made. Under real-time sonographic guidance, the vessel was punctured with a 21 gauge micropuncture needle. Using standard technique, the initial micro needle was exchanged over a 0.018 micro wire for a transitional 4 [place] micro sheath. The micro sheath was then exchanged over a 0.035 wire for a 5 French vascular sheath. A C2 cobra slip catheter was advanced up in over the aortic bifurcation into the left internal iliac artery. In internal iliac arteriogram was performed. Diminutive anterior division. No definite recurrent prostatic artery segments. To ensure no  significant arterial supply to the left lobe of the prostate artery,  a Cook cantata 2.5 [place] microcatheter was advanced into the anterior division and additional digital subtraction angiography was performed. No significant recurrent prostatic arterialization. A the C2 cobra catheter was brought back into the ipsilateral common iliac artery and with the assistance of a Glidewire used to select the right internal iliac artery. A right internal iliac arteriogram was performed. Prostatic artery is easily visualized arising from the proximal aspect of the obturator artery. To prevent arterial spasm, 100 mcg intra arterial nitroglycerin was administered. The Cook cantata microcatheter was successfully navigated into the prostatic artery. Contrast injection was performed. There is robust filling of the prostate gland. An additional 100 mcg intra arterial nitroglycerin was administered. Repeat contrast injection was performed confirming no significant collateralization or sources of non target embolization. There is atherosclerotic plaque resulting in narrowing of the mid aspect of the prostatic artery. Particle embolization was then performed using 100-300 micron embospheres. Follow-up arteriography demonstrates significant pruning with decreased flow and no significant parenchymal opacification in the prostatic arteries. To prevent recurrence, coil embolization of the prostatic artery was performed using low-profile Ruby detachable microcoils. Final contrast injection demonstrates technical success. The catheters were removed. Hemostasis was attained with the assistance of a Celt arterial closure device. IMPRESSION: 1. Successful combined particle and coil embolization of the previously untreated right prostatic artery. 2. Interrogation of the left prostatic artery demonstrates successful prior embolization without evidence of recurrent vascularity. Signed, Sterling Big, MD, RPVI [facility] Inter[facility]  Radiology Specialists [facility] Radiology Electronically Signed   By: Malachy Moan M.D.   On: 02/16/2023 11:10     Assessment and Plan: Gross hematuria.   The urine is light pink with some only clot/debris but bleeding appears to have abated with the PAE and stopping plavix.  I communicated with Dr. Randie Heinz and he felt it was safe to keep him off of the plavix.   I don't think he needs further intervention at this time, but if the bleeding becomes more brisk, he would need cystoscopy and possible fulguration.  Chronic blood loss anemia.  Hgb is back down some to 7.8 but I would just monitor for now.        LOS: 1 day    Bjorn Pippin 8/18/2024Patient ID: Kemper Durie, male   DOB: [birth year], 77 y.o.   MRN: [id number] Patient ID: Middleton Daquino, male   DOB: [birth year], 77 y.o.   MRN: [id number]

## 2023-02-17 NOTE — Plan of Care (Signed)

## 2023-02-18 DIAGNOSIS — D62 Acute posthemorrhagic anemia: Secondary | ICD-10-CM | POA: Diagnosis not present

## 2023-02-18 LAB — CBC
HCT: 25.2 % — ABNORMAL LOW (ref 39.0–52.0)
Hemoglobin: 7.7 g/dL — ABNORMAL LOW (ref 13.0–17.0)
MCH: 28.4 pg (ref 26.0–34.0)
MCHC: 30.6 g/dL (ref 30.0–36.0)
MCV: 93 fL (ref 80.0–100.0)
Platelets: 304 10*3/uL (ref 150–400)
RBC: 2.71 MIL/uL — ABNORMAL LOW (ref 4.22–5.81)
RDW: 17.4 % — ABNORMAL HIGH (ref 11.5–15.5)
WBC: 9.7 10*3/uL (ref 4.0–10.5)
nRBC: 0.4 % — ABNORMAL HIGH (ref 0.0–0.2)

## 2023-02-18 LAB — URINE CULTURE: Culture: >=100000 — AB

## 2023-02-18 LAB — HEMOGLOBIN AND HEMATOCRIT, BLOOD
HCT: 26.3 % — ABNORMAL LOW (ref 39.0–52.0)
Hemoglobin: 8 g/dL — ABNORMAL LOW (ref 13.0–17.0)

## 2023-02-18 NOTE — Progress Notes (Signed)
PROGRESS NOTE    Nathan [name]  MWU:[id number] DOB: [birth year] DOA: 02/15/2023 PCP: Dois Davenport, MD   Brief Narrative: Nathan [name] is a 77 y.o. male with a history of prostate cancer, hypertension, diabetes, DVT.  Patient presented secondary to bloody urine, weakness and dizziness with concern for acute hemorrhage leading to severe anemia requiring blood transfusion.  Urology was consulted with recommendation for our consult for prostatic artery embolization.  Right prostatic artery embolization performed on 8/17 successfully.   Assessment and Plan:  Acute blood loss anemia Secondary to gross hematuria.  Baseline hemoglobin of around 11-12.  Hemoglobin of 6.5 g/dL on admission.  Patient transfused 2 units of PRBC with posttransfusion hemoglobin of 7.6 g/dL.  Hemoglobin up to 9.4 g/dL with recurrent downtrend. Hemoglobin of 7.7 g/dL this morning -CBC daily  Gross hematuria In relation to known prostate cancer and complicated by Plavix use. Urology consulted and recommended IR evaluation for embolization of right prostatic artery embolization. IR successfully embolized right prostatic artery on 8/17.  Primary hypertension Patient is on metoprolol, verapamil and lisinopril as an outpatient. Antihypertensives held secondary to gross hematuria and resultant blood loss anemia requiring multiple blood transfusion.  Chronic suprapubic catheter Noted.  Positive urine culture Patient is afebrile and without leukocytosis. It does not appear suprapubic catheter was changed prior to obtaining urine culture.  Diabetes mellitus type 2 Well controlled. A1C of 5.0%.  History of right iliac stent Patient is on Plavix as an outpatient which is held on admission secondary to acute hemorrhaging.   DVT prophylaxis: SCDs Code Status:   Code Status: Full Code Family Communication: None at bedside Disposition Plan: Discharge home likely in 1 day pending improved hematuria and stable  hemoglobin.   Consultants:  Urology Interventional radiology  Procedures:  8/17: Successful prostatic artery embolization  Antimicrobials: None    Subjective: Patient reports no concerns this morning. Saw urology earlier today.  Objective: BP (!) 107/57 (BP Location: Right Arm)   Pulse 80   Temp 98.6 F (37 C) (Oral)   Resp 18   Ht 6\' 1"  (1.854 m)   Wt 77.1 kg   SpO2 100%   BMI 22.43 kg/m   Examination:  General exam: Appears calm and comfortable Respiratory system: Clear to auscultation. Respiratory effort normal. Cardiovascular system: S1 & S2 heard, RRR. Gastrointestinal system: Abdomen is nondistended, soft and nontender. Normal bowel sounds heard. Genitourinary: foley bag with dark red bloody urine Central nervous system: Alert and oriented. No focal neurological deficits. Psychiatry: Judgement and insight appear normal. Mood & affect appropriate.    Data Reviewed: I have personally reviewed following labs and imaging studies  CBC Lab Results  Component Value Date   WBC 9.7 02/18/2023   RBC 2.71 (L) 02/18/2023   HGB 8.0 (L) 02/18/2023   HCT 26.3 (L) 02/18/2023   MCV 93.0 02/18/2023   MCH 28.4 02/18/2023   PLT 304 02/18/2023   MCHC 30.6 02/18/2023   RDW 17.4 (H) 02/18/2023   LYMPHSABS 0.9 02/15/2023   MONOABS 0.7 02/15/2023   EOSABS 0.2 02/15/2023   BASOSABS 0.0 02/15/2023     Last metabolic panel Lab Results  Component Value Date   NA 142 02/16/2023   K 4.3 02/16/2023   CL 109 02/16/2023   CO2 26 02/16/2023   BUN 11 02/16/2023   CREATININE 1.15 02/16/2023   GLUCOSE 85 02/16/2023   GFRNONAA >60 02/16/2023   GFRAA >60 02/28/2020   CALCIUM 8.3 (L) 02/16/2023   PHOS 5.3 (H) 01/24/2022  PROT 6.6 02/15/2023   ALBUMIN 3.5 02/15/2023   BILITOT 0.4 02/15/2023   ALKPHOS 54 02/15/2023   AST 14 (L) 02/15/2023   ALT 12 02/15/2023   ANIONGAP 7 02/16/2023    GFR: Estimated Creatinine Clearance: 56.8 mL/min (by C-G formula based on SCr of  1.15 mg/dL).  Recent Results (from the past 240 hour(s))  Urine Culture     Status: Abnormal   Collection Time: 02/15/23  9:19 AM   Specimen: Urine, Random  Result Value Ref Range Status   Specimen Description   Final    URINE, RANDOM Performed at [facility], [address] [place], [place] [address]    Special Requests   Final    NONE Reflexed from [id number] Performed at [facility], [address], [place], [place] [address]    Culture (A)  Final    >=100,000 COLONIES/mL CITROBACTER KOSERI 50,000 COLONIES/mL PSEUDOMONAS AERUGINOSA 50,000 COLONIES/mL ESCHERICHIA COLI    Report Status 02/18/2023 FINAL  Final   Organism ID, Bacteria CITROBACTER KOSERI (A)  Final   Organism ID, Bacteria PSEUDOMONAS AERUGINOSA (A)  Final   Organism ID, Bacteria ESCHERICHIA COLI (A)  Final      Susceptibility   Citrobacter koseri - MIC*    CEFEPIME <=0.12 SENSITIVE Sensitive     CEFTRIAXONE <=0.25 SENSITIVE Sensitive     CIPROFLOXACIN <=0.25 SENSITIVE Sensitive     GENTAMICIN <=1 SENSITIVE Sensitive     IMIPENEM <=0.25 SENSITIVE Sensitive     NITROFURANTOIN 32 SENSITIVE Sensitive     TRIMETH/SULFA <=20 SENSITIVE Sensitive     PIP/TAZO <=4 SENSITIVE Sensitive     * >=100,000 COLONIES/mL CITROBACTER KOSERI   Escherichia coli - MIC*    AMPICILLIN 8 SENSITIVE Sensitive     CEFAZOLIN <=4 SENSITIVE Sensitive     CEFEPIME <=0.12 SENSITIVE Sensitive     CEFTRIAXONE <=0.25 SENSITIVE Sensitive     CIPROFLOXACIN <=0.25 SENSITIVE Sensitive     GENTAMICIN <=1 SENSITIVE Sensitive     IMIPENEM <=0.25 SENSITIVE Sensitive     NITROFURANTOIN <=16 SENSITIVE Sensitive     TRIMETH/SULFA <=20 SENSITIVE Sensitive     AMPICILLIN/SULBACTAM 4 SENSITIVE Sensitive     PIP/TAZO <=4 SENSITIVE Sensitive     * 50,000 COLONIES/mL ESCHERICHIA COLI   Pseudomonas aeruginosa - MIC*    CEFTAZIDIME 32 RESISTANT Resistant     CIPROFLOXACIN <=0.25 SENSITIVE Sensitive     GENTAMICIN <=1  SENSITIVE Sensitive     IMIPENEM 1 SENSITIVE Sensitive     * 50,000 COLONIES/mL PSEUDOMONAS AERUGINOSA      Radiology Studies: No results found.    LOS: 2 days    Jacquelin Hawking, MD [facility] 02/18/2023, 2:06 PM   If 7PM-7AM, please contact night-coverage [email or web address]

## 2023-02-18 NOTE — Plan of Care (Signed)

## 2023-02-18 NOTE — Progress Notes (Signed)
Patient ID: Nathan [name], male   DOB: [birth year], 77 y.o.   MRN: [id number]    Subjective: S/P completion right sided prostatic arterial embolization yesterday.  Now off antiplatelet therapy.  Objective: Vital signs in last 24 hours: Temp:  [97.2 F (36.2 C)-98.8 F (37.1 C)] 98.8 F (37.1 C) (08/19 0507) Pulse Rate:  [88-109] 88 (08/19 0507) Resp:  [17-18] 17 (08/19 0507) BP: (124-136)/(65-81) 134/65 (08/19 0507) SpO2:  [99 %-100 %] 99 % (08/19 0507)  Intake/Output from previous day: 08/18 0701 - 08/19 0700 In: 840 [P.O.:840] Out: 2750 [Urine:2750] Intake/Output this shift: No intake/output data recorded.  Physical Exam:  General: Alert and oriented GU: SP tube with clear drainage with a few small old clots  Lab Results: Recent Labs    02/17/23 1340 02/17/23 2055 02/18/23 0504  HGB 8.5* 7.7* 7.7*  HCT 27.6* 24.8* 25.2*   BMET Recent Labs    02/15/23 0919 02/16/23 0603  NA 138 142  K 3.8 4.3  CL 107 109  CO2 25 26  GLUCOSE 95 85  BUN 12 11  CREATININE 1.20 1.15  CALCIUM 8.6* 8.3*     Studies/Results:   Assessment/Plan: 1) Hematuria: S/P completion prostatic arterial embolization.  Currently, no active bleeding and he appears stable for discharge home from a urologic standpoint.  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  No indication for further urologic intervention at this time.  He has follow up already scheduled in our office for ongoing SP tube changes and cancer surveillance.   LOS: 2 days   Crecencio Mc 02/18/2023, 7:28 AM

## 2023-02-18 NOTE — Progress Notes (Signed)
   02/18/23 0913  TOC Brief Assessment  Insurance and Status Reviewed  Patient has primary care physician Yes  Home environment has been reviewed Home w/ spouse  Prior level of function: Modified independent  Prior/Current Home Services No current home services  Social Determinants of Health Reivew SDOH reviewed no interventions necessary  Readmission risk has been reviewed Yes  Transition of care needs no transition of care needs at this time

## 2023-02-19 DIAGNOSIS — D62 Acute posthemorrhagic anemia: Secondary | ICD-10-CM | POA: Diagnosis not present

## 2023-02-19 LAB — CBC
HCT: 26 % — ABNORMAL LOW (ref 39.0–52.0)
Hemoglobin: 7.8 g/dL — ABNORMAL LOW (ref 13.0–17.0)
MCH: 28 pg (ref 26.0–34.0)
MCHC: 30 g/dL (ref 30.0–36.0)
MCV: 93.2 fL (ref 80.0–100.0)
Platelets: 259 10*3/uL (ref 150–400)
RBC: 2.79 MIL/uL — ABNORMAL LOW (ref 4.22–5.81)
RDW: 17.3 % — ABNORMAL HIGH (ref 11.5–15.5)
WBC: 11.8 10*3/uL — ABNORMAL HIGH (ref 4.0–10.5)
nRBC: 0.2 % (ref 0.0–0.2)

## 2023-02-19 MED ORDER — CIPROFLOXACIN HCL 500 MG PO TABS
500.0000 mg | ORAL_TABLET | Freq: Two times a day (BID) | ORAL | Status: AC
  Administered 2023-02-19 – 2023-02-23 (×8): 500 mg via ORAL
  Filled 2023-02-19 (×8): qty 1

## 2023-02-19 NOTE — Progress Notes (Signed)
   02/19/23 2032  Assess: MEWS Score  Temp (!) 102.2 F (39 C)  BP (!) 113/57  Pulse Rate (!) 102  Resp 18  Level of Consciousness Alert  SpO2 97 %  O2 Device Room Air  Assess: MEWS Score  MEWS Temp 2  MEWS Systolic 0  MEWS Pulse 1  MEWS RR 0  MEWS LOC 0  MEWS Score 3  MEWS Score Color Yellow  Assess: if the MEWS score is Yellow or Red  Were vital signs accurate and taken at a resting state? Yes  Does the patient meet 2 or more of the SIRS criteria? Yes  Does the patient have a confirmed or suspected source of infection? Yes  MEWS guidelines implemented  Yes, yellow  Treat  MEWS Interventions Considered administering scheduled or prn medications/treatments as ordered  Take Vital Signs  Increase Vital Sign Frequency  Yellow: Q2hr x1, continue Q4hrs until patient remains green for 12hrs  Escalate  MEWS: Escalate Yellow: Discuss with charge nurse and consider notifying provider and/or RRT  Notify: Charge Nurse/RN  Name of Charge Nurse/RN Notified T Varshini Arrants  Provider Notification  Provider Name/Title Garner Nash  Date Provider Notified 02/19/23  Time Provider Notified 2305  Method of Notification Page  Notification Reason Change in status  Provider response Other (Comment) (ice pt)  Date of Provider Response 02/19/23  Time of Provider Response 2045  Assess: SIRS CRITERIA  SIRS Temperature  1  SIRS Pulse 1  SIRS Respirations  0  SIRS WBC 0  SIRS Score Sum  2

## 2023-02-19 NOTE — Plan of Care (Signed)
?  Problem: Clinical Measurements: ?Goal: Will remain free from infection ?Outcome: Progressing ?Goal: Diagnostic test results will improve ?Outcome: Progressing ?Goal: Respiratory complications will improve ?Outcome: Progressing ?  ?

## 2023-02-19 NOTE — Care Management Important Message (Signed)
Important Message  Patient Details IM Letter given. Name: Nathan [name] MRN: [id number] Date of Birth: 08-12-1945   Medicare Important Message Given:  Yes     Caren Macadam 02/19/2023, 12:21 PM

## 2023-02-19 NOTE — Progress Notes (Signed)
Pt. Daughter requests for an update re treatment plan. MD was notified. RN reached out to Urology team as per daughter request to relay message. Will continue to monitor.

## 2023-02-19 NOTE — Progress Notes (Signed)
Mobility Specialist - Progress Note   02/19/23 1603  Mobility  Activity Ambulated with assistance in hallway  Level of Assistance Standby assist, set-up cues, supervision of patient - no hands on  Assistive Device Front wheel walker  Mobility Referral Yes  $Mobility charge 1 Mobility  Mobility Specialist Start Time (ACUTE ONLY) 0350  Mobility Specialist Stop Time (ACUTE ONLY) 0404  Mobility Specialist Time Calculation (min) (ACUTE ONLY) 14 min   Pt received in bed and agreeable to mobility. No complaints during session. Pt to bed after session with all needs met.    [facility]

## 2023-02-19 NOTE — Plan of Care (Signed)
  Problem: Clinical Measurements: Goal: Ability to maintain clinical measurements within normal limits will improve Outcome: Progressing Goal: Will remain free from infection Outcome: Progressing   Problem: Activity: Goal: Risk for activity intolerance will decrease Outcome: Progressing   Problem: Nutrition: Goal: Adequate nutrition will be maintained Outcome: Progressing   Problem: Pain Managment: Goal: General experience of comfort will improve Outcome: Progressing   Problem: Activity: Goal: Ability to return to baseline activity level will improve Outcome: Progressing

## 2023-02-19 NOTE — Progress Notes (Signed)
PROGRESS NOTE    Shai Freudenthal  GMW:[id number] DOB: [birth year] DOA: 02/15/2023 PCP: Dois Davenport, MD   Brief Narrative: Nathan [name] is a 77 y.o. male with a history of prostate cancer, hypertension, diabetes, DVT.  Patient presented secondary to bloody urine, weakness and dizziness with concern for acute hemorrhage leading to severe anemia requiring blood transfusion.  Urology was consulted with recommendation for our consult for prostatic artery embolization.  Right prostatic artery embolization performed on 8/17 successfully.   Assessment and Plan:  Acute blood loss anemia Secondary to gross hematuria.  Baseline hemoglobin of around 11-12.  Hemoglobin of 6.5 g/dL on admission.  Patient transfused 2 units of PRBC with posttransfusion hemoglobin of 7.6 g/dL.  Hemoglobin up to 9.4 g/dL with recurrent downtrend and stabilization Hemoglobin of 7.8 g/dL this morning -CBC daily  Gross hematuria In relation to known prostate cancer and complicated by Plavix use. Urology consulted and recommended IR evaluation for embolization of right prostatic artery embolization. IR successfully embolized right prostatic artery on 8/17. Hematuria continues.  Primary hypertension Patient is on metoprolol, verapamil and lisinopril as an outpatient. Antihypertensives held secondary to gross hematuria and resultant blood loss anemia requiring multiple blood transfusion.  Catheter associated UTI Likely present on admission. Patient with fever. Urine culture significant for Citrobacter koseri, Pseudomonas aeruginosa and E. Coli bacteria. Leukocytosis slightly elevated. No other symptoms per patient. Patient already started on Ciprofloxacin for prophylaxis.  -Continue Ciprofloxacin with plan for 7 days of treatment.  Chronic suprapubic catheter Noted.  Positive urine culture Patient is afebrile and without leukocytosis. It does not appear suprapubic catheter was changed prior to obtaining urine  culture.  Diabetes mellitus type 2 Well controlled. A1C of 5.0%.  History of right iliac stent Patient is on Plavix as an outpatient which is held on admission secondary to acute hemorrhaging.   DVT prophylaxis: SCDs Code Status:   Code Status: Full Code Family Communication: None at bedside Disposition Plan: Discharge home likely in 1-2 day pending improved hematuria and stable hemoglobin and urology recommendations   Consultants:  Urology Interventional radiology  Procedures:  8/17: Successful prostatic artery embolization  Antimicrobials: None    Subjective: Patient with continued hematuria. Urine bag with fresh bloody urine.   Objective: BP 113/64 (BP Location: Right Arm)   Pulse 88   Temp 98.5 F (36.9 C) (Oral)   Resp 18   Ht 6\' 1"  (1.854 m)   Wt 77.1 kg   SpO2 99%   BMI 22.43 kg/m   Examination:  General exam: Appears calm and comfortable Respiratory system: Clear to auscultation. Respiratory effort normal. Cardiovascular system: S1 & S2 heard, RRR. Gastrointestinal system: Abdomen is nondistended, soft and nontender. Normal bowel sounds heard. Central nervous system: Alert and oriented. No focal neurological deficits. Musculoskeletal: No edema. No calf tenderness Skin: No cyanosis. No rashes Psychiatry: Judgement and insight appear normal. Mood & affect appropriate.    Data Reviewed: I have personally reviewed following labs and imaging studies  CBC Lab Results  Component Value Date   WBC 11.8 (H) 02/19/2023   RBC 2.79 (L) 02/19/2023   HGB 7.8 (L) 02/19/2023   HCT 26.0 (L) 02/19/2023   MCV 93.2 02/19/2023   MCH 28.0 02/19/2023   PLT 259 02/19/2023   MCHC 30.0 02/19/2023   RDW 17.3 (H) 02/19/2023   LYMPHSABS 0.9 02/15/2023   MONOABS 0.7 02/15/2023   EOSABS 0.2 02/15/2023   BASOSABS 0.0 02/15/2023     Last metabolic panel Lab Results  Component  Value Date   NA 142 02/16/2023   K 4.3 02/16/2023   CL 109 02/16/2023   CO2 26  02/16/2023   BUN 11 02/16/2023   CREATININE 1.15 02/16/2023   GLUCOSE 85 02/16/2023   GFRNONAA >60 02/16/2023   GFRAA >60 02/28/2020   CALCIUM 8.3 (L) 02/16/2023   PHOS 5.3 (H) 01/24/2022   PROT 6.6 02/15/2023   ALBUMIN 3.5 02/15/2023   BILITOT 0.4 02/15/2023   ALKPHOS 54 02/15/2023   AST 14 (L) 02/15/2023   ALT 12 02/15/2023   ANIONGAP 7 02/16/2023    GFR: Estimated Creatinine Clearance: 56.8 mL/min (by C-G formula based on SCr of 1.15 mg/dL).  Recent Results (from the past 240 hour(s))  Urine Culture     Status: Abnormal   Collection Time: 02/15/23  9:19 AM   Specimen: Urine, Random  Result Value Ref Range Status   Specimen Description   Final    URINE, RANDOM Performed at [facility], [address] [place], [place] [address]    Special Requests   Final    NONE Reflexed from [id number] Performed at [facility], [address], [place], [place] [address]    Culture (A)  Final    >=100,000 COLONIES/mL CITROBACTER KOSERI 50,000 COLONIES/mL PSEUDOMONAS AERUGINOSA 50,000 COLONIES/mL ESCHERICHIA COLI    Report Status 02/18/2023 FINAL  Final   Organism ID, Bacteria CITROBACTER KOSERI (A)  Final   Organism ID, Bacteria PSEUDOMONAS AERUGINOSA (A)  Final   Organism ID, Bacteria ESCHERICHIA COLI (A)  Final      Susceptibility   Citrobacter koseri - MIC*    CEFEPIME <=0.12 SENSITIVE Sensitive     CEFTRIAXONE <=0.25 SENSITIVE Sensitive     CIPROFLOXACIN <=0.25 SENSITIVE Sensitive     GENTAMICIN <=1 SENSITIVE Sensitive     IMIPENEM <=0.25 SENSITIVE Sensitive     NITROFURANTOIN 32 SENSITIVE Sensitive     TRIMETH/SULFA <=20 SENSITIVE Sensitive     PIP/TAZO <=4 SENSITIVE Sensitive     * >=100,000 COLONIES/mL CITROBACTER KOSERI   Escherichia coli - MIC*    AMPICILLIN 8 SENSITIVE Sensitive     CEFAZOLIN <=4 SENSITIVE Sensitive     CEFEPIME <=0.12 SENSITIVE Sensitive     CEFTRIAXONE <=0.25 SENSITIVE Sensitive     CIPROFLOXACIN <=0.25  SENSITIVE Sensitive     GENTAMICIN <=1 SENSITIVE Sensitive     IMIPENEM <=0.25 SENSITIVE Sensitive     NITROFURANTOIN <=16 SENSITIVE Sensitive     TRIMETH/SULFA <=20 SENSITIVE Sensitive     AMPICILLIN/SULBACTAM 4 SENSITIVE Sensitive     PIP/TAZO <=4 SENSITIVE Sensitive     * 50,000 COLONIES/mL ESCHERICHIA COLI   Pseudomonas aeruginosa - MIC*    CEFTAZIDIME 32 RESISTANT Resistant     CIPROFLOXACIN <=0.25 SENSITIVE Sensitive     GENTAMICIN <=1 SENSITIVE Sensitive     IMIPENEM 1 SENSITIVE Sensitive     * 50,000 COLONIES/mL PSEUDOMONAS AERUGINOSA      Radiology Studies: No results found.    LOS: 3 days    Jacquelin Hawking, MD [facility] 02/19/2023, 10:24 AM   If 7PM-7AM, please contact night-coverage [email or web address]

## 2023-02-19 NOTE — Progress Notes (Signed)
   02/19/23 1928  Assess: MEWS Score  Temp (!) 101.6 F (38.7 C)  BP 112/63  MAP (mmHg) 78  Pulse Rate 99  Resp 18  SpO2 98 %  O2 Device Room Air  Assess: MEWS Score  MEWS Temp 2  MEWS Systolic 0  MEWS Pulse 0  MEWS RR 0  MEWS LOC 0  MEWS Score 2  MEWS Score Color Yellow  Assess: if the MEWS score is Yellow or Red  Were vital signs accurate and taken at a resting state? Yes  Does the patient meet 2 or more of the SIRS criteria? Yes  Does the patient have a confirmed or suspected source of infection? Yes  MEWS guidelines implemented  Yes, yellow  Treat  MEWS Interventions Considered administering scheduled or prn medications/treatments as ordered  Take Vital Signs  Increase Vital Sign Frequency  Yellow: Q2hr x1, continue Q4hrs until patient remains green for 12hrs  Escalate  MEWS: Escalate Yellow: Discuss with charge nurse and consider notifying provider and/or RRT  Notify: Charge Nurse/RN  Name of Charge Nurse/RN Notified T Riordan Walle  Provider Notification  Provider Name/Title Garner Nash  Date Provider Notified 02/19/23  Time Provider Notified 1939  Method of Notification Page  Notification Reason Change in status  Provider response No new orders  Date of Provider Response 02/19/23  Time of Provider Response 2044  Assess: SIRS CRITERIA  SIRS Temperature  1  SIRS Pulse 1  SIRS Respirations  0  SIRS WBC 0  SIRS Score Sum  2

## 2023-02-20 DIAGNOSIS — D62 Acute posthemorrhagic anemia: Secondary | ICD-10-CM | POA: Diagnosis not present

## 2023-02-20 LAB — CBC
HCT: 24.7 % — ABNORMAL LOW (ref 39.0–52.0)
Hemoglobin: 7.5 g/dL — ABNORMAL LOW (ref 13.0–17.0)
MCH: 28.6 pg (ref 26.0–34.0)
MCHC: 30.4 g/dL (ref 30.0–36.0)
MCV: 94.3 fL (ref 80.0–100.0)
Platelets: 275 10*3/uL (ref 150–400)
RBC: 2.62 MIL/uL — ABNORMAL LOW (ref 4.22–5.81)
RDW: 17.5 % — ABNORMAL HIGH (ref 11.5–15.5)
WBC: 12.2 10*3/uL — ABNORMAL HIGH (ref 4.0–10.5)
nRBC: 0 % (ref 0.0–0.2)

## 2023-02-20 NOTE — Progress Notes (Signed)
PROGRESS NOTE    Nathan [name]  WUJ:[id number] DOB: June 22, 1946 DOA: 02/15/2023 PCP: Dois Davenport, MD  Chief Complaint  Patient presents with   Fatigue   Dizziness    Bleeding from suprapubic cath    Brief Narrative:   Nathan [name] is Nathan [name] 77 y.o. male with Dotsie Gillette history of prostate cancer, hypertension, diabetes, DVT. Patient presented secondary to bloody urine, weakness and dizziness with concern for acute hemorrhage leading to severe anemia requiring blood transfusion. Urology was consulted with recommendation for our consult for prostatic artery embolization. Right prostatic artery embolization performed on 8/17 successfully.   Assessment & Plan:   Principal Problem:   Acute blood loss anemia  Catheter Associated Urinary Tract Infection Systemic Inflammatory Response Syndrome Urine culture with citrobacter koseri, pseudomonas aeruginosa, e. Coli -- all sensitive to cipro Recurrent fevers noted 8/19 and 8/20 with gradually worsening leukocytosis - despite antibiotic therapy If has recurrent fever, will need blood cultures and to repeat urine culture  Acute blood loss anemia S/p 2 units pRBC S/p combined particle and coil embolization of previously untreated R prostatic artery (8/17) Hb relatively stable today, will continue to trend    Gross hematuria In relation to known prostate cancer and complicated by Plavix use. Urology consulted and recommended IR evaluation for embolization of right prostatic artery embolization. IR successfully embolized right prostatic artery on 8/17. Hematuria continues.   Primary hypertension Patient is on metoprolol, verapamil and lisinopril as an outpatient. Antihypertensives held secondary to gross hematuria and resultant blood loss anemia requiring multiple blood transfusion.   Chronic suprapubic catheter Noted.   Diabetes mellitus type 2 Well controlled. A1C of 5.0%.   History of right iliac stent Patient is on Plavix as an outpatient  which is held on admission secondary to acute hemorrhaging. Will discuss with vascular     DVT prophylaxis: SCD Code Status: full Family Communication: daughter over phone Disposition:   Status is: Inpatient Remains inpatient appropriate because: need for continued inpatient care   Consultants:  urology  Procedures:  none  Antimicrobials:  Anti-infectives (From admission, onward)    Start     Dose/Rate Route Frequency Ordered Stop   02/19/23 2000  ciprofloxacin (CIPRO) tablet 500 mg        500 mg Oral 2 times daily 02/19/23 0832 02/23/23 1959   02/16/23 2000  ciprofloxacin (CIPRO) tablet 500 mg  Status:  Discontinued        500 mg Oral 2 times daily 02/16/23 1713 02/19/23 0832   02/16/23 0800  sulfamethoxazole-trimethoprim (BACTRIM) 400-80 MG per tablet 1 tablet        1 tablet Oral  Once 02/15/23 1626 02/16/23 0909       Subjective: No pain  Objective: Vitals:   02/20/23 0121 02/20/23 0542 02/20/23 0936 02/20/23 1425  BP: 113/74 111/66 127/68 112/61  Pulse: 93 79 93 96  Resp: 17 17 20 18   Temp: 97.9 F (36.6 C) 98.4 F (36.9 C) 98.1 F (36.7 C)   TempSrc:  Oral Oral   SpO2: 99% 100% 100% 100%  Weight:      Height:        Intake/Output Summary (Last 24 hours) at 02/20/2023 1455 Last data filed at 02/20/2023 0900 Gross per 24 hour  Intake 118 ml  Output 2301 ml  Net -2183 ml   Filed Weights   02/15/23 0854  Weight: 77.1 kg    Examination:  General exam: Appears calm and comfortable  Respiratory system: unlabored Cardiovascular system:RRR Gastrointestinal system:  Abdomen is nondistended, soft and nontender. Central nervous system: Alert and oriented. No focal neurological deficits. Extremities: no LEE    Data Reviewed: I have personally reviewed following labs and imaging studies  CBC: Recent Labs  Lab 02/15/23 0919 02/16/23 0603 02/16/23 1237 02/17/23 0520 02/17/23 1340 02/17/23 2055 02/18/23 0504 02/18/23 1234 02/19/23 0543  02/20/23 0529  WBC 6.4 5.7  --  7.1  --   --  9.7  --  11.8* 12.2*  NEUTROABS 4.6  --   --   --   --   --   --   --   --   --   HGB 6.5* 7.6*   < > 7.8*   < > 7.7* 7.7* 8.0* 7.8* 7.5*  HCT 21.9* 24.9*   < > 25.3*   < > 24.8* 25.2* 26.3* 26.0* 24.7*  MCV 96.9 91.9  --  92.3  --   --  93.0  --  93.2 94.3  PLT 293 256  --  262  --   --  304  --  259 275   < > = values in this interval not displayed.    Basic Metabolic Panel: Recent Labs  Lab 02/15/23 0919 02/16/23 0603  NA 138 142  K 3.8 4.3  CL 107 109  CO2 25 26  GLUCOSE 95 85  BUN 12 11  CREATININE 1.20 1.15  CALCIUM 8.6* 8.3*    GFR: Estimated Creatinine Clearance: 56.8 mL/min (by C-G formula based on SCr of 1.15 mg/dL).  Liver Function Tests: Recent Labs  Lab 02/15/23 0919  AST 14*  ALT 12  ALKPHOS 54  BILITOT 0.4  PROT 6.6  ALBUMIN 3.5    CBG: No results for input(s): "GLUCAP" in the last 168 hours.   Recent Results (from the past 240 hour(s))  Urine Culture     Status: Abnormal   Collection Time: 02/15/23  9:19 AM   Specimen: Urine, Random  Result Value Ref Range Status   Specimen Description   Final    URINE, RANDOM Performed at [facility], [address] [place], [place] [address]    Special Requests   Final    NONE Reflexed from [id number] Performed at [facility], [address], [place], [place] [address]    Culture (Asja Frommer)  Final    >=100,000 COLONIES/mL CITROBACTER KOSERI 50,000 COLONIES/mL PSEUDOMONAS AERUGINOSA 50,000 COLONIES/mL ESCHERICHIA COLI    Report Status 02/18/2023 FINAL  Final   Organism ID, Bacteria CITROBACTER KOSERI (Malky Rudzinski)  Final   Organism ID, Bacteria PSEUDOMONAS AERUGINOSA (Camren Henthorn)  Final   Organism ID, Bacteria ESCHERICHIA COLI (Niana Martorana)  Final      Susceptibility   Citrobacter koseri - MIC*    CEFEPIME <=0.12 SENSITIVE Sensitive     CEFTRIAXONE <=0.25 SENSITIVE Sensitive     CIPROFLOXACIN <=0.25 SENSITIVE Sensitive     GENTAMICIN <=1 SENSITIVE  Sensitive     IMIPENEM <=0.25 SENSITIVE Sensitive     NITROFURANTOIN 32 SENSITIVE Sensitive     TRIMETH/SULFA <=20 SENSITIVE Sensitive     PIP/TAZO <=4 SENSITIVE Sensitive     * >=100,000 COLONIES/mL CITROBACTER KOSERI   Escherichia coli - MIC*    AMPICILLIN 8 SENSITIVE Sensitive     CEFAZOLIN <=4 SENSITIVE Sensitive     CEFEPIME <=0.12 SENSITIVE Sensitive     CEFTRIAXONE <=0.25 SENSITIVE Sensitive     CIPROFLOXACIN <=0.25 SENSITIVE Sensitive     GENTAMICIN <=1 SENSITIVE Sensitive     IMIPENEM <=0.25 SENSITIVE Sensitive  NITROFURANTOIN <=16 SENSITIVE Sensitive     TRIMETH/SULFA <=20 SENSITIVE Sensitive     AMPICILLIN/SULBACTAM 4 SENSITIVE Sensitive     PIP/TAZO <=4 SENSITIVE Sensitive     * 50,000 COLONIES/mL ESCHERICHIA COLI   Pseudomonas aeruginosa - MIC*    CEFTAZIDIME 32 RESISTANT Resistant     CIPROFLOXACIN <=0.25 SENSITIVE Sensitive     GENTAMICIN <=1 SENSITIVE Sensitive     IMIPENEM 1 SENSITIVE Sensitive     * 50,000 COLONIES/mL PSEUDOMONAS AERUGINOSA         Radiology Studies: No results found.      Scheduled Meds:  ciprofloxacin  500 mg Oral BID   feeding supplement  237 mL Oral BID BM   ferrous sulfate  325 mg Oral Q breakfast   gabapentin  300 mg Oral TID   Continuous Infusions:   LOS: 4 days    Time spent: over 30 min    Lacretia Nicks, MD [facility]   To contact the attending provider between 7A-7P or the covering provider during after hours 7P-7A, please log into the web site [email or web address] and access using universal [place] password for that web site. If you do not have the password, please call the hospital operator.  02/20/2023, 2:55 PM

## 2023-02-20 NOTE — Evaluation (Signed)
Physical Therapy Evaluation Patient Details Name: Nathan [name] MRN: [id number] DOB: Jan 27, 1946 Today's Date: 02/20/2023  History of Present Illness  Pt is 77 yo male admitted 02/15/23 with bloody urine, weakness and dizziness with concern for acute hemorrhage leading to severe anemia requiring 2 units PRBC.  Urology consulted with R prostatic artery embolization on 8/17. Pt with hx of prostate CA, HTN, DM, DVT  Clinical Impression  Pt admitted with above diagnosis. He lives with family and typically ambulates with rollator or cane for short community distances.  He denies falls at home.  Pt performs ADLs but family assist with IADLs.  Today, pt supervision level for transfers and ambulated 300' with RW.  He declined stair training but demonstrates the balance, strength, and ROM necessary for stairs.  Pt appears to be near baseline and no further acute PT needs.         If plan is discharge home, recommend the following:     Can travel by private vehicle        Equipment Recommendations None recommended by PT  Recommendations for Other Services       Functional Status Assessment Patient has not had a recent decline in their functional status     Precautions / Restrictions Precautions Precautions: None      Mobility  Bed Mobility Overal bed mobility: Needs Assistance Bed Mobility: Supine to Sit, Sit to Supine     Supine to sit: Modified independent (Device/Increase time) Sit to supine: Modified independent (Device/Increase time)        Transfers Overall transfer level: Needs assistance Equipment used: Rolling walker (2 wheels), None Transfers: Sit to/from Stand Sit to Stand: Supervision           General transfer comment: Performed with and without RW    Ambulation/Gait Ambulation/Gait assistance: Supervision, Contact guard assist Gait Distance (Feet): 300 Feet Assistive device: Rolling walker (2 wheels) Gait Pattern/deviations: WFL(Within Functional  Limits) Gait velocity: normal     General Gait Details: CGA progressed to supervision; good stability  Stairs Stairs:  (declined stairs but demonstrates ROM, strength, and balance necessary for stairs)          Wheelchair Mobility     Tilt Bed    Modified Rankin (Stroke Patients Only)       Balance Overall balance assessment: Needs assistance Sitting-balance support: No upper extremity supported Sitting balance-Leahy Scale: Normal     Standing balance support: Bilateral upper extremity supported, No upper extremity supported Standing balance-Leahy Scale: Fair Standing balance comment: RW to ambulate ; static and weight shifting without support                             Pertinent Vitals/Pain Pain Assessment Pain Assessment: No/denies pain    Home Living Family/patient expects to be discharged to:: Private residence Living Arrangements: Children Available Help at Discharge: Family;Available 24 hours/day Type of Home: House Home Access: Stairs to enter Entrance Stairs-Rails: Right Entrance Stairs-Number of Steps: 2 Alternate Level Stairs-Number of Steps: flight Home Layout: Two level;Bed/bath upstairs Home Equipment: Cane - single point;Rollator (4 wheels);Grab bars - tub/shower;Grab bars - toilet;Shower seat      Prior Function Prior Level of Function : Independent/Modified Independent             Mobility Comments: Could ambulate used RW or cane depending on the day; only ambulates short community distances; denies falls ADLs Comments: Reports independent adls and family assist with IADLs; does not  drive     Extremity/Trunk Assessment   Upper Extremity Assessment Upper Extremity Assessment: Overall WFL for tasks assessed    Lower Extremity Assessment Lower Extremity Assessment: Overall WFL for tasks assessed    Cervical / Trunk Assessment Cervical / Trunk Assessment: Normal  Communication      Cognition Arousal: Alert Behavior  During Therapy: WFL for tasks assessed/performed Overall Cognitive Status: Within Functional Limits for tasks assessed                                          General Comments General comments (skin integrity, edema, etc.): VSS    Exercises     Assessment/Plan    PT Assessment Patient does not need any further PT services  PT Problem List         PT Treatment Interventions      PT Goals (Current goals can be found in the Care Plan section)  Acute Rehab PT Goals Patient Stated Goal: return home once he finds out why he keeps bleeding PT Goal Formulation: All assessment and education complete, DC therapy    Frequency       Co-evaluation               AM-PAC PT "6 Clicks" Mobility  Outcome Measure Help needed turning from your back to your side while in a flat bed without using bedrails?: None Help needed moving from lying on your back to sitting on the side of a flat bed without using bedrails?: None Help needed moving to and from a bed to a chair (including a wheelchair)?: A Little Help needed standing up from a chair using your arms (e.g., wheelchair or bedside chair)?: A Little Help needed to walk in hospital room?: A Little Help needed climbing 3-5 steps with a railing? : A Little 6 Click Score: 20    End of Session Equipment Utilized During Treatment: Gait belt Activity Tolerance: Patient tolerated treatment well Patient left: in bed;with call bell/phone within reach;with bed alarm set Nurse Communication: Mobility status PT Visit Diagnosis: Other abnormalities of gait and mobility (R26.89)    Time: 1610-9604 PT Time Calculation (min) (ACUTE ONLY): 15 min   Charges:   PT Evaluation $PT Eval Low Complexity: 1 Low   PT General Charges $$ ACUTE PT VISIT: 1 Visit         Anise Salvo, PT Acute Rehab Services [facility] [phone]   Rayetta Humphrey 02/20/2023, 1:54 PM

## 2023-02-20 NOTE — Progress Notes (Addendum)
Patient ID: Nathan [name], male   DOB: February 29, 1946, 77 y.o.   MRN: [id number]    Subjective: Pt doing well.  Fever to 102 yesterday.  Objective: Vital signs in last 24 hours: Temp:  [97.9 F (36.6 C)-102.2 F (39 C)] 98.4 F (36.9 C) (08/21 0542) Pulse Rate:  [79-102] 79 (08/21 0542) Resp:  [17-18] 17 (08/21 0542) BP: (107-120)/(55-74) 111/66 (08/21 0542) SpO2:  [97 %-100 %] 100 % (08/21 0542)  Intake/Output from previous day: 08/20 0701 - 08/21 0700 In: 120 [P.O.:120] Out: 3601 [Urine:3600; Stool:1] Intake/Output this shift: No intake/output data recorded.  Physical Exam:  General: Alert and oriented GU: Urine tea colored.  Hand irrigated and a few small old clots removed.  No active bleeding.  Lab Results: Recent Labs    02/18/23 1234 02/19/23 0543 02/20/23 0529  HGB 8.0* 7.8* 7.5*  HCT 26.3* 26.0* 24.7*   BMET   Studies/Results: No results found.  Assessment/Plan: 1) Hematuria: Stabilized s/p prostatic arterial embolization 8/19 and stopping Plavix.  Hgb stable.  Ok for discharge from a urologic standpoint.  Will arrange f/u for SP tube change.   2) UTI: On ciprofloxacin.  Complete treatment.   LOS: 4 days   Crecencio Mc 02/20/2023, 8:22 AM

## 2023-02-21 DIAGNOSIS — D62 Acute posthemorrhagic anemia: Secondary | ICD-10-CM | POA: Diagnosis not present

## 2023-02-21 LAB — BASIC METABOLIC PANEL
Anion gap: 7 (ref 5–15)
BUN: 17 mg/dL (ref 8–23)
CO2: 25 mmol/L (ref 22–32)
Calcium: 8.4 mg/dL — ABNORMAL LOW (ref 8.9–10.3)
Chloride: 102 mmol/L (ref 98–111)
Creatinine, Ser: 1.17 mg/dL (ref 0.61–1.24)
GFR, Estimated: >60 mL/min (ref >60)
Glucose, Bld: 99 mg/dL (ref 70–99)
Potassium: 4.2 mmol/L (ref 3.5–5.1)
Sodium: 134 mmol/L — ABNORMAL LOW (ref 135–145)

## 2023-02-21 LAB — CBC
HCT: 22.9 % — ABNORMAL LOW (ref 39.0–52.0)
Hemoglobin: 7.1 g/dL — ABNORMAL LOW (ref 13.0–17.0)
MCH: 29 pg (ref 26.0–34.0)
MCHC: 31 g/dL (ref 30.0–36.0)
MCV: 93.5 fL (ref 80.0–100.0)
Platelets: 270 10*3/uL (ref 150–400)
RBC: 2.45 MIL/uL — ABNORMAL LOW (ref 4.22–5.81)
RDW: 16.9 % — ABNORMAL HIGH (ref 11.5–15.5)
WBC: 9.5 10*3/uL (ref 4.0–10.5)
nRBC: 0 % (ref 0.0–0.2)

## 2023-02-21 LAB — HEMOGLOBIN AND HEMATOCRIT, BLOOD
HCT: 22.1 % — ABNORMAL LOW (ref 39.0–52.0)
Hemoglobin: 6.8 g/dL — CL (ref 13.0–17.0)

## 2023-02-21 LAB — PHOSPHORUS: Phosphorus: 3.8 mg/dL (ref 2.5–4.6)

## 2023-02-21 LAB — PREPARE RBC (CROSSMATCH)

## 2023-02-21 LAB — MAGNESIUM: Magnesium: 2.1 mg/dL (ref 1.7–2.4)

## 2023-02-21 MED ORDER — SODIUM CHLORIDE 0.9% IV SOLUTION: Freq: Once | INTRAVENOUS | Status: AC

## 2023-02-21 NOTE — Progress Notes (Signed)
Mobility Specialist Cancellation/Refusal Note:   Pt declined mobility x2 times today. Will check back as schedule permits.     [facility]

## 2023-02-21 NOTE — Progress Notes (Signed)
PROGRESS NOTE    Nathan [name]  YNW:[id number] DOB: 1945-10-23 DOA: 02/15/2023 PCP: Dois Davenport, MD  Chief Complaint  Patient presents with   Fatigue   Dizziness    Bleeding from suprapubic cath    Brief Narrative:   Nathan [name] is Nathan [name] 77 y.o. male with Else Habermann history of prostate cancer, hypertension, diabetes, DVT. Patient presented secondary to bloody urine, weakness and dizziness with concern for acute hemorrhage leading to severe anemia requiring blood transfusion. Urology was consulted with recommendation for our consult for prostatic artery embolization. Right prostatic artery embolization performed on 8/17 successfully.   Assessment & Plan:   Principal Problem:   Acute blood loss anemia  Catheter Associated Urinary Tract Infection Systemic Inflammatory Response Syndrome Urine culture with citrobacter koseri, pseudomonas aeruginosa, e. Coli -- all sensitive to cipro Recurrent fevers noted 8/19 and 8/20 with gradually worsening leukocytosis - despite antibiotic therapy -> improved today, will continue and complete course of cipro.   If has recurrent fever, will need blood cultures and to repeat urine culture  Acute blood loss anemia S/p 2 units pRBC S/p combined particle and coil embolization of previously untreated R prostatic artery (8/17) Worsened anemia today, will transfuse additional unit pRBC   Gross hematuria In relation to known prostate cancer and complicated by Plavix use. Urology consulted and recommended IR evaluation for embolization of right prostatic artery embolization. IR successfully embolized right prostatic artery on 8/17. Hematuria continues.   Primary hypertension Patient is on metoprolol, verapamil and lisinopril as an outpatient. Antihypertensives held secondary to gross hematuria and resultant blood loss anemia requiring multiple blood transfusion.   Chronic suprapubic catheter Noted.   Diabetes mellitus type 2 Well controlled. A1C of 5.0%.    History of right iliac stent Patient is on Plavix as an outpatient which is held on admission secondary to acute hemorrhaging. Will discuss with vascular prior to discharge, will continue to hold while H/H unstable    DVT prophylaxis: SCD Code Status: full Family Communication: daughter over phone Disposition:   Status is: Inpatient Remains inpatient appropriate because: need for continued inpatient care   Consultants:  urology  Procedures:  none  Antimicrobials:  Anti-infectives (From admission, onward)    Start     Dose/Rate Route Frequency Ordered Stop   02/19/23 2000  ciprofloxacin (CIPRO) tablet 500 mg        500 mg Oral 2 times daily 02/19/23 0832 02/23/23 1959   02/16/23 2000  ciprofloxacin (CIPRO) tablet 500 mg  Status:  Discontinued        500 mg Oral 2 times daily 02/16/23 1713 02/19/23 0832   02/16/23 0800  sulfamethoxazole-trimethoprim (BACTRIM) 400-80 MG per tablet 1 tablet        1 tablet Oral  Once 02/15/23 1626 02/16/23 0909       Subjective: Denies new complaints  Objective: Vitals:   02/20/23 2007 02/21/23 0521 02/21/23 1122 02/21/23 1457  BP: 111/68 100/60 118/73 126/62  Pulse: (!) 103 83 95 87  Resp: 17 17 16 17   Temp: 99.6 F (37.6 C) 99 F (37.2 C) 98.4 F (36.9 C) 98.3 F (36.8 C)  TempSrc: Oral Oral Oral Oral  SpO2: 99% 98% 98% 100%  Weight:      Height:        Intake/Output Summary (Last 24 hours) at 02/21/2023 1509 Last data filed at 02/21/2023 1500 Gross per 24 hour  Intake 610.67 ml  Output 550 ml  Net 60.67 ml   [employer]  02/15/23 0854  Weight: 77.1 kg    Examination:  General: No acute distress. Cardiovascular: RRR Lungs: Clear to auscultation bilaterally with good air movement. No rales, rhonchi or wheezes. Abdomen: Soft, nontender, nondistended - suprapubic catheter Neurological: Alert and oriented 3. Moves all extremities 4 with equal strength. Cranial nerves II through XII grossly intact. Extremities:  No clubbing or cyanosis. No edema.    Data Reviewed: I have personally reviewed following labs and imaging studies  CBC: Recent Labs  Lab 02/15/23 0919 02/16/23 0603 02/17/23 0520 02/17/23 1340 02/18/23 0504 02/18/23 1234 02/19/23 0543 02/20/23 0529 02/21/23 0515 02/21/23 1112  WBC 6.4   < > 7.1  --  9.7  --  11.8* 12.2* 9.5  --   NEUTROABS 4.6  --   --   --   --   --   --   --   --   --   HGB 6.5*   < > 7.8*   < > 7.7* 8.0* 7.8* 7.5* 7.1* 6.8*  HCT 21.9*   < > 25.3*   < > 25.2* 26.3* 26.0* 24.7* 22.9* 22.1*  MCV 96.9   < > 92.3  --  93.0  --  93.2 94.3 93.5  --   PLT 293   < > 262  --  304  --  259 275 270  --    < > = values in this interval not displayed.    Basic Metabolic Panel: Recent Labs  Lab 02/15/23 0919 02/16/23 0603 02/21/23 0515  NA 138 142 134*  K 3.8 4.3 4.2  CL 107 109 102  CO2 25 26 25   GLUCOSE 95 85 99  BUN 12 11 17   CREATININE 1.20 1.15 1.17  CALCIUM 8.6* 8.3* 8.4*  MG  --   --  2.1  PHOS  --   --  3.8    GFR: Estimated Creatinine Clearance: 55.8 mL/min (by C-G formula based on SCr of 1.17 mg/dL).  Liver Function Tests: Recent Labs  Lab 02/15/23 0919  AST 14*  ALT 12  ALKPHOS 54  BILITOT 0.4  PROT 6.6  ALBUMIN 3.5    CBG: No results for input(s): "GLUCAP" in the last 168 hours.   Recent Results (from the past 240 hour(s))  Urine Culture     Status: Abnormal   Collection Time: 02/15/23  9:19 AM   Specimen: Urine, Random  Result Value Ref Range Status   Specimen Description   Final    URINE, RANDOM Performed at [facility], [address] [place], [place] [address]    Special Requests   Final    NONE Reflexed from [id number] Performed at [facility], [address] [place], [place] [address]    Culture (Nachman Sundt)  Final    >=100,000 COLONIES/mL CITROBACTER KOSERI 50,000 COLONIES/mL PSEUDOMONAS AERUGINOSA 50,000 COLONIES/mL ESCHERICHIA COLI    Report Status 02/18/2023 FINAL  Final    Organism ID, Bacteria CITROBACTER KOSERI (Kamee Bobst)  Final   Organism ID, Bacteria PSEUDOMONAS AERUGINOSA (Manly Nestle)  Final   Organism ID, Bacteria ESCHERICHIA COLI (Laney Bagshaw)  Final      Susceptibility   Citrobacter koseri - MIC*    CEFEPIME <=0.12 SENSITIVE Sensitive     CEFTRIAXONE <=0.25 SENSITIVE Sensitive     CIPROFLOXACIN <=0.25 SENSITIVE Sensitive     GENTAMICIN <=1 SENSITIVE Sensitive     IMIPENEM <=0.25 SENSITIVE Sensitive     NITROFURANTOIN 32 SENSITIVE Sensitive     TRIMETH/SULFA <=20 SENSITIVE Sensitive  PIP/TAZO <=4 SENSITIVE Sensitive     * >=100,000 COLONIES/mL CITROBACTER KOSERI   Escherichia coli - MIC*    AMPICILLIN 8 SENSITIVE Sensitive     CEFAZOLIN <=4 SENSITIVE Sensitive     CEFEPIME <=0.12 SENSITIVE Sensitive     CEFTRIAXONE <=0.25 SENSITIVE Sensitive     CIPROFLOXACIN <=0.25 SENSITIVE Sensitive     GENTAMICIN <=1 SENSITIVE Sensitive     IMIPENEM <=0.25 SENSITIVE Sensitive     NITROFURANTOIN <=16 SENSITIVE Sensitive     TRIMETH/SULFA <=20 SENSITIVE Sensitive     AMPICILLIN/SULBACTAM 4 SENSITIVE Sensitive     PIP/TAZO <=4 SENSITIVE Sensitive     * 50,000 COLONIES/mL ESCHERICHIA COLI   Pseudomonas aeruginosa - MIC*    CEFTAZIDIME 32 RESISTANT Resistant     CIPROFLOXACIN <=0.25 SENSITIVE Sensitive     GENTAMICIN <=1 SENSITIVE Sensitive     IMIPENEM 1 SENSITIVE Sensitive     * 50,000 COLONIES/mL PSEUDOMONAS AERUGINOSA         Radiology Studies: No results found.      Scheduled Meds:  ciprofloxacin  500 mg Oral BID   feeding supplement  237 mL Oral BID BM   ferrous sulfate  325 mg Oral Q breakfast   gabapentin  300 mg Oral TID   Continuous Infusions:   LOS: 5 days    Time spent: over 30 min    Lacretia Nicks, MD [facility]   To contact the attending provider between 7A-7P or the covering provider during after hours 7P-7A, please log into the web site [email or web address] and access using universal [place] password for that web site. If you do  not have the password, please call the hospital operator.  02/21/2023, 3:09 PM

## 2023-02-21 NOTE — Procedures (Signed)
   Urology Procedure Note:   Mr. Vanderwood had an appointment for suprapubic tube exchange tomorrow in clinic.  Additionally he has struggled with clot obstruction overnight.  We have elected to replace it this morning.  Patient agrees and has provided consent.  Patient was prepped and draped in the usual sterile fashion.  Previous suprapubic tube was removed.  All identifying markings had been scrubbed off of the hub but it appeared to be around 18-38f.  A 11f catheter was replaced without difficulty.  By then hand irrigated around 250 cc, pulling out a significant amount of old clot material.  Catheter was placed to drainage with no dependent loops.  Retention device was replaced higher up on patient's thigh, so as to take tension out of the Foley.  Discussed with daughter over the phone during the procedure.  Follow-up will be rescheduled for 3-4 weeks from now for routine SPT exchange.

## 2023-02-22 DIAGNOSIS — D62 Acute posthemorrhagic anemia: Secondary | ICD-10-CM | POA: Diagnosis not present

## 2023-02-22 LAB — BASIC METABOLIC PANEL
Anion gap: 9 (ref 5–15)
BUN: 15 mg/dL (ref 8–23)
CO2: 25 mmol/L (ref 22–32)
Calcium: 8.7 mg/dL — ABNORMAL LOW (ref 8.9–10.3)
Chloride: 101 mmol/L (ref 98–111)
Creatinine, Ser: 1.03 mg/dL (ref 0.61–1.24)
GFR, Estimated: >60 mL/min (ref >60)
Glucose, Bld: 100 mg/dL — ABNORMAL HIGH (ref 70–99)
Potassium: 4.8 mmol/L (ref 3.5–5.1)
Sodium: 135 mmol/L (ref 135–145)

## 2023-02-22 LAB — CBC
HCT: 26.9 % — ABNORMAL LOW (ref 39.0–52.0)
Hemoglobin: 8.3 g/dL — ABNORMAL LOW (ref 13.0–17.0)
MCH: 28.5 pg (ref 26.0–34.0)
MCHC: 30.9 g/dL (ref 30.0–36.0)
MCV: 92.4 fL (ref 80.0–100.0)
Platelets: 281 10*3/uL (ref 150–400)
RBC: 2.91 MIL/uL — ABNORMAL LOW (ref 4.22–5.81)
RDW: 15.9 % — ABNORMAL HIGH (ref 11.5–15.5)
WBC: 9.4 10*3/uL (ref 4.0–10.5)
nRBC: 0 % (ref 0.0–0.2)

## 2023-02-22 LAB — TYPE AND SCREEN
ABO/RH(D): B POS
Antibody Screen: NEGATIVE
Unit division: 0

## 2023-02-22 LAB — MAGNESIUM: Magnesium: 1.9 mg/dL (ref 1.7–2.4)

## 2023-02-22 LAB — BPAM RBC
Blood Product Expiration Date: 202409232359
ISSUE DATE / TIME: 202408221450
Unit Type and Rh: 5100

## 2023-02-22 LAB — PHOSPHORUS: Phosphorus: 4.2 mg/dL (ref 2.5–4.6)

## 2023-02-22 NOTE — Plan of Care (Signed)
  Problem: Education: Goal: Knowledge of General Education information will improve Description: Including pain rating scale, medication(s)/side effects and non-pharmacologic comfort measures Outcome: Progressing   Problem: Health Behavior/Discharge Planning: Goal: Ability to manage health-related needs will improve Outcome: Progressing   Problem: Clinical Measurements: Goal: Ability to maintain clinical measurements within normal limits will improve Outcome: Progressing Goal: Diagnostic test results will improve Outcome: Progressing   Problem: Activity: Goal: Risk for activity intolerance will decrease Outcome: Progressing   Problem: Safety: Goal: Ability to remain free from injury will improve Outcome: Progressing   Problem: Activity: Goal: Ability to return to baseline activity level will improve Outcome: Progressing   Problem: Health Behavior/Discharge Planning: Goal: Ability to safely manage health-related needs after discharge will improve Outcome: Progressing

## 2023-02-22 NOTE — Progress Notes (Addendum)
PROGRESS NOTE    Nathan [name]  ZDG:[id number] DOB: April 03, 1946 DOA: 02/15/2023 PCP: Dois Davenport, MD  Chief Complaint  Patient presents with   Fatigue   Dizziness    Bleeding from suprapubic cath    Brief Narrative:   Nathan [name] is Nathan [name] 77 y.o. male with Nathan [name] history of prostate cancer, hypertension, diabetes, DVT. Patient presented secondary to bloody urine, weakness and dizziness with concern for acute hemorrhage leading to severe anemia requiring blood transfusion. Urology was consulted with recommendation for our consult for prostatic artery embolization. Right prostatic artery embolization performed on 8/17 successfully.   Assessment & Plan:   Principal Problem:   Acute blood loss anemia  Catheter Associated Urinary Tract Infection Systemic Inflammatory Response Syndrome Urine culture with citrobacter koseri, pseudomonas aeruginosa, e. Coli -- all sensitive to cipro Recurrent fevers noted 8/19 and 8/20 with gradually worsening leukocytosis - despite antibiotic therapy -> improved today, will continue and complete course of cipro.   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 Urology consulted and recommended IR evaluation for embolization of right prostatic artery embolization. IR successfully embolized right prostatic artery on 8/17. Hematuria continues, will discuss with urology.   Primary hypertension Patient is on metoprolol, verapamil and lisinopril as an outpatient. Antihypertensives held secondary to gross hematuria and resultant blood loss anemia requiring multiple blood transfusion.   Chronic suprapubic catheter Noted.   Diabetes mellitus type 2 Well controlled. A1C of 5.0%.   History of right  iliac stent Patient is on Plavix as an outpatient which is held on admission secondary to acute hemorrhaging. Will discuss with vascular prior to discharge, will continue to hold while H/H unstable    DVT prophylaxis: SCD Code Status: full Family Communication: daughter over phone 8/23 Disposition:   Status is: Inpatient Remains inpatient appropriate because: need for continued inpatient care   Consultants:  urology  Procedures:  none  Antimicrobials:  Anti-infectives (From admission, onward)    Start     Dose/Rate Route Frequency Ordered Stop   02/19/23 2000  ciprofloxacin (CIPRO) tablet 500 mg        500 mg Oral 2 times daily 02/19/23 0832 02/23/23 1959   02/16/23 2000  ciprofloxacin (CIPRO) tablet 500 mg  Status:  Discontinued        500 mg Oral 2 times daily 02/16/23 1713 02/19/23 0832   02/16/23 0800  sulfamethoxazole-trimethoprim (BACTRIM) 400-80 MG per tablet 1 tablet        1 tablet Oral  Once 02/15/23 1626 02/16/23 0909       Subjective: Denies new complaints  Objective: Vitals:   02/21/23 1514 02/21/23 1801 02/21/23 1916 02/22/23 0546  BP: 125/68 134/71 137/68 (!) 112/57  Pulse: 91 84 98 81  Resp: 18 19 17 16   Temp: 98.2 F (36.8 C) 98.4 F (36.9 C) 99.7 F (37.6 C) 99 F (37.2 C)  TempSrc: Oral Oral Oral   SpO2: 98% 99% 100% 97%  Weight:      Height:        Intake/Output Summary (Last 24 hours) at 02/22/2023 1213 Last data filed at 02/21/2023 1800 Gross per 24 hour  Intake 722.67 ml  Output 800 ml  Net -77.33 ml   Filed Weights   02/15/23  0854  Weight: 77.1 kg    Examination:  General: No acute distress. Sitting up in chair. Cardiovascular: RRR Lungs: unlabored Dark red urine in foley bag Neurological: Alert and oriented 3. Moves all extremities 4 with equal strength. Cranial nerves II through XII grossly intact. Extremities: No clubbing or cyanosis. No edema.     Data Reviewed: I have personally reviewed following labs and  imaging studies  CBC: Recent Labs  Lab 02/18/23 0504 02/18/23 1234 02/19/23 0543 02/20/23 0529 02/21/23 0515 02/21/23 1112 02/22/23 0456  WBC 9.7  --  11.8* 12.2* 9.5  --  9.4  HGB 7.7*   < > 7.8* 7.5* 7.1* 6.8* 8.3*  HCT 25.2*   < > 26.0* 24.7* 22.9* 22.1* 26.9*  MCV 93.0  --  93.2 94.3 93.5  --  92.4  PLT 304  --  259 275 270  --  281   < > = values in this interval not displayed.    Basic Metabolic Panel: Recent Labs  Lab 02/16/23 0603 02/21/23 0515 02/22/23 0456  NA 142 134* 135  K 4.3 4.2 4.8  CL 109 102 101  CO2 26 25 25   GLUCOSE 85 99 100*  BUN 11 17 15   CREATININE 1.15 1.17 1.03  CALCIUM 8.3* 8.4* 8.7*  MG  --  2.1 1.9  PHOS  --  3.8 4.2    GFR: Estimated Creatinine Clearance: 63.4 mL/min (by C-G formula based on SCr of 1.03 mg/dL).  Liver Function Tests: No results for input(s): "AST", "ALT", "ALKPHOS", "BILITOT", "PROT", "ALBUMIN" in the last 168 hours.   CBG: No results for input(s): "GLUCAP" in the last 168 hours.   Recent Results (from the past 240 hour(s))  Urine Culture     Status: Abnormal   Collection Time: 02/15/23  9:19 AM   Specimen: Urine, Random  Result Value Ref Range Status   Specimen Description   Final    URINE, RANDOM Performed at [facility], [address] [place], [place] [address]    Special Requests   Final    NONE Reflexed from [id number] Performed at [facility], [address] [place], [place] [address]    Culture (Brett Darko)  Final    >=100,000 COLONIES/mL CITROBACTER KOSERI 50,000 COLONIES/mL PSEUDOMONAS AERUGINOSA 50,000 COLONIES/mL ESCHERICHIA COLI    Report Status 02/18/2023 FINAL  Final   Organism ID, Bacteria CITROBACTER KOSERI (Jrue Jarriel)  Final   Organism ID, Bacteria PSEUDOMONAS AERUGINOSA (Asaiah Hunnicutt)  Final   Organism ID, Bacteria ESCHERICHIA COLI (Sofhia Ulibarri)  Final      Susceptibility   Citrobacter koseri - MIC*    CEFEPIME <=0.12 SENSITIVE Sensitive     CEFTRIAXONE <=0.25 SENSITIVE  Sensitive     CIPROFLOXACIN <=0.25 SENSITIVE Sensitive     GENTAMICIN <=1 SENSITIVE Sensitive     IMIPENEM <=0.25 SENSITIVE Sensitive     NITROFURANTOIN 32 SENSITIVE Sensitive     TRIMETH/SULFA <=20 SENSITIVE Sensitive     PIP/TAZO <=4 SENSITIVE Sensitive     * >=100,000 COLONIES/mL CITROBACTER KOSERI   Escherichia coli - MIC*    AMPICILLIN 8 SENSITIVE Sensitive     CEFAZOLIN <=4 SENSITIVE Sensitive     CEFEPIME <=0.12 SENSITIVE Sensitive     CEFTRIAXONE <=0.25 SENSITIVE Sensitive     CIPROFLOXACIN <=0.25 SENSITIVE Sensitive     GENTAMICIN <=1 SENSITIVE Sensitive     IMIPENEM <=0.25 SENSITIVE Sensitive     NITROFURANTOIN <=16 SENSITIVE Sensitive     TRIMETH/SULFA <=20 SENSITIVE Sensitive  AMPICILLIN/SULBACTAM 4 SENSITIVE Sensitive     PIP/TAZO <=4 SENSITIVE Sensitive     * 50,000 COLONIES/mL ESCHERICHIA COLI   Pseudomonas aeruginosa - MIC*    CEFTAZIDIME 32 RESISTANT Resistant     CIPROFLOXACIN <=0.25 SENSITIVE Sensitive     GENTAMICIN <=1 SENSITIVE Sensitive     IMIPENEM 1 SENSITIVE Sensitive     * 50,000 COLONIES/mL PSEUDOMONAS AERUGINOSA         Radiology Studies: No results found.      Scheduled Meds:  ciprofloxacin  500 mg Oral BID   feeding supplement  237 mL Oral BID BM   ferrous sulfate  325 mg Oral Q breakfast   gabapentin  300 mg Oral TID   Continuous Infusions:   LOS: 6 days    Time spent: over 30 min    Nathan Nicks, MD [facility]   To contact the attending provider between 7A-7P or the covering provider during after hours 7P-7A, please log into the web site [email or web address] and access using universal [place] password for that web site. If you do not have the password, please call the hospital operator.  02/22/2023, 12:13 PM

## 2023-02-22 NOTE — Care Management Important Message (Signed)
Important Message  Patient Details IM Letter given. Name: Nathan [name] MRN: [id number] Date of Birth: 01/12/1946   Medicare Important Message Given:  Yes     Caren Macadam 02/22/2023, 12:07 PM

## 2023-02-23 DIAGNOSIS — D62 Acute posthemorrhagic anemia: Secondary | ICD-10-CM | POA: Diagnosis not present

## 2023-02-23 LAB — BASIC METABOLIC PANEL
Anion gap: 6 (ref 5–15)
BUN: 18 mg/dL (ref 8–23)
CO2: 25 mmol/L (ref 22–32)
Calcium: 8.4 mg/dL — ABNORMAL LOW (ref 8.9–10.3)
Chloride: 103 mmol/L (ref 98–111)
Creatinine, Ser: 1.06 mg/dL (ref 0.61–1.24)
GFR, Estimated: >60 mL/min (ref >60)
Glucose, Bld: 93 mg/dL (ref 70–99)
Potassium: 4.3 mmol/L (ref 3.5–5.1)
Sodium: 134 mmol/L — ABNORMAL LOW (ref 135–145)

## 2023-02-23 LAB — CBC
HCT: 26 % — ABNORMAL LOW (ref 39.0–52.0)
Hemoglobin: 7.9 g/dL — ABNORMAL LOW (ref 13.0–17.0)
MCH: 28.2 pg (ref 26.0–34.0)
MCHC: 30.4 g/dL (ref 30.0–36.0)
MCV: 92.9 fL (ref 80.0–100.0)
Platelets: 304 10*3/uL (ref 150–400)
RBC: 2.8 MIL/uL — ABNORMAL LOW (ref 4.22–5.81)
RDW: 15.8 % — ABNORMAL HIGH (ref 11.5–15.5)
WBC: 7.6 10*3/uL (ref 4.0–10.5)
nRBC: 0 % (ref 0.0–0.2)

## 2023-02-23 LAB — PHOSPHORUS: Phosphorus: 3.3 mg/dL (ref 2.5–4.6)

## 2023-02-23 LAB — HEMOGLOBIN AND HEMATOCRIT, BLOOD
HCT: 25.6 % — ABNORMAL LOW (ref 39.0–52.0)
Hemoglobin: 7.9 g/dL — ABNORMAL LOW (ref 13.0–17.0)

## 2023-02-23 LAB — MAGNESIUM: Magnesium: 1.9 mg/dL (ref 1.7–2.4)

## 2023-02-23 MED ORDER — FERROUS SULFATE 325 (65 FE) MG PO TABS: 325.0000 mg | ORAL_TABLET | ORAL | 1 refills | Status: AC

## 2023-02-23 NOTE — Plan of Care (Signed)

## 2023-02-23 NOTE — Plan of Care (Signed)

## 2023-02-23 NOTE — Discharge Summary (Signed)
Physician Discharge Summary  [name]:[id number] DOB: 03-01-1946 DOA: 02/15/2023  PCP: Dois Davenport, MD  Admit date: 02/15/2023 Discharge date: 02/23/2023  Time spent: 40 minutes  Recommendations for Outpatient Follow-up:  Follow outpatient CBC/CMP  Follow with urology outpatient Holding plavix due to hematuria, defer to urology for when to resume - needs urology follow up  BP meds on hold, follow outpatient   Discharge Diagnoses:  Principal Problem:   Acute blood loss anemia   Discharge Condition: stable  Diet recommendation: heart healthy   Filed Weights   02/15/23 0854  Weight: 77.1 kg    History of present illness:  Nathan [name] is Nathan [name] 77 y.o. male with Danikah Budzik history of prostate cancer, hypertension, diabetes, DVT.  Patient presented secondary to bloody urine, weakness and dizziness with concern for acute hemorrhage leading to severe anemia requiring blood transfusion.  Urology was consulted with recommendation for our consult for prostatic artery embolization.  Right prostatic artery embolization performed on 8/17 successfully.  Has required 3 units pRBC during admission.  Hb stable 8/24.  Plan for discharge with outpatient urology follow up.  See below for additional details   Hospital Course:  Assessment and Plan:  Catheter Associated Urinary Tract Infection Systemic Inflammatory Response Syndrome Urine culture with citrobacter koseri, pseudomonas aeruginosa, e. Coli -- all sensitive to cipro Completed course of cipro    Acute blood loss anemia S/p 3 units pRBC S/p combined particle and coil embolization of previously untreated R prostatic artery (8/17) Continue iron Follow with urology outpatient for repeat labs  Gross hematuria In relation to known prostate cancer and complicated by Plavix use. Urology consulted and recommended IR evaluation for embolization of right prostatic artery embolization. IR successfully embolized right prostatic artery on  8/17.  Blood in foley bag suspected old clot per urology, plan to follow outpatient with urology for repeat labs   Primary hypertension Patient is on metoprolol, verapamil and lisinopril as an outpatient. Continue to hold at discharge as BP reasonable   Chronic suprapubic catheter Noted.   Diabetes mellitus type 2 Well controlled. A1C of 5.0%.   History of right iliac stent Patient is on Plavix as an outpatient which is held on admission secondary to acute hemorrhaging. Will continue to hold on discharge, defer to urology when safe to resume I let vascular know about situation at discharge and of need for follow up     Procedures: combined particle and coil embolization of previously untreated R prostatic artery (8/17)    Consultations: urology  Discharge Exam: Vitals:   02/23/23 0449 02/23/23 1316  BP: 134/76 123/76  Pulse: 92 91  Resp: 18 16  Temp: 97.8 F (36.6 C) 98.4 F (36.9 C)  SpO2: 99% 100%   No complaints Discussed discharge plans with patient and daughter.  Daughter is somewhat uncomfortable with discharge, we repeated Hb prior to discharge.  They'll follow with urology this week.  General: No acute distress. Cardiovascular: RRR Lungs: unlabored Abdomen: Soft, nontender, nondistended  Neurological: Alert and oriented 3. Moves all extremities 4 with equal strength. Cranial nerves II through XII grossly intact. Extremities: No clubbing or cyanosis. No edema.   Discharge Instructions   Discharge Instructions     Call MD for:  difficulty breathing, headache or visual disturbances   Complete by: As directed    Call MD for:  extreme fatigue   Complete by: As directed    Call MD for:  hives   Complete by: As directed    Call  MD for:  persistant dizziness or light-headedness   Complete by: As directed    Call MD for:  persistant nausea and vomiting   Complete by: As directed    Call MD for:  redness, tenderness, or signs of infection (pain,  swelling, redness, odor or green/yellow discharge around incision site)   Complete by: As directed    Call MD for:  severe uncontrolled pain   Complete by: As directed    Call MD for:  temperature >100.4   Complete by: As directed    Diet - low sodium heart healthy   Complete by: As directed    Discharge instructions   Complete by: As directed    You were seen for hematuria (blood in your urine).  You were treated with embolization of the right prostatic artery.  Your blood counts are relatively stable today.  We'll send you home with [name] plan to continue holding plavix and follow up with urology early next week for repeat labs.  If you have lightheadedness, dizziness, or worsening bleeding, return to the hospital.    Stop your blood pressure medicines for now.  Your blood pressures have been reasonable here without them.  Stop your plavix until you get clearance to resume this medicine from urology.    Return for new, recurrent, or worsening symptoms.  Please ask your PCP to request records from this hospitalization so they know what was done and what the next steps will be.   Increase activity slowly   Complete by: As directed    No wound care   Complete by: As directed       Allergies as of 02/23/2023   No Known Allergies      Medication List     STOP taking these medications    clopidogrel 75 MG tablet Commonly known as: PLAVIX   lisinopril 40 MG tablet Commonly known as: ZESTRIL   metoprolol succinate 100 MG 24 hr tablet Commonly known as: TOPROL-XL   verapamil 120 MG 24 hr capsule Commonly known as: VERELAN       TAKE these medications    ergocalciferol 1.25 MG (50000 UT) capsule Commonly known as: VITAMIN D2 Take 50,000 Units by mouth once Vonna Brabson week.   ferrous sulfate 325 (65 FE) MG tablet Take 1 tablet (325 mg total) by mouth every other day. What changed: when to take this   finasteride 5 MG tablet Commonly known as: PROSCAR Take 5 mg by mouth  daily.   Fish Oil 1000 MG Caps Take 1,000 mg by mouth daily.   folic acid 1 MG tablet Commonly known as: FOLVITE Take 1 mg by mouth daily.   gabapentin 300 MG capsule Commonly known as: NEURONTIN Take 300 mg by mouth 3 (three) times daily.   magnesium oxide 400 MG tablet Commonly known as: MAG-OX Take 400 mg by mouth daily.   OVER THE COUNTER MEDICATION Take 1 capsule by mouth daily as needed (Siatic Nerve pain). Sciatiease   OVER THE COUNTER MEDICATION Take 1 capsule by mouth daily. Methylate 1 daily   prenatal multivitamin Tabs tablet Take 1 tablet by mouth daily at 12 noon.       No Known Allergies    The results of significant diagnostics from this hospitalization (including imaging, microbiology, ancillary and laboratory) are listed below for reference.    Significant Diagnostic Studies: IR Angiogram Pelvis Selective Or Supraselective  Result Date: 02/16/2023 INDICATION: 77 year old male with benign prostatic hyperplasia and recurrent hematuria in the setting of  Keegan Bensch mandatory antiplatelet therapy. He underwent partially successful prostate artery embolization 1 year previously in July of 2023. 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 EXAM: IR EMBO ARTERIAL NOT HEMORR HEMANG INC GUIDE ROADMAPPING; IR ULTRASOUND GUIDANCE VASC ACCESS RIGHT; PELVIC SELECTIVE ARTERIOGRAPHY MEDICATIONS: Bactrim DS was administered p.o. prior to the procedure. ANESTHESIA/SEDATION: Moderate (conscious) sedation was employed during this procedure. Jimia Gentles total of Versed 1.5 mg and Fentanyl 75 mcg was administered intravenously. Moderate Sedation Time: 62 minutes. The patient's level of consciousness and vital signs were monitored continuously by radiology nursing throughout the procedure under my direct supervision. CONTRAST:  <See Chart> OMNIPAQUE IOHEXOL 300 MG/ML SOLN, 45mL OMNIPAQUE IOHEXOL 300 MG/ML SOLN, 45mL OMNIPAQUE IOHEXOL  300 MG/ML SOLN FLUOROSCOPY: Radiation Exposure Index (as provided by the fluoroscopic device): 426 mGy Kerma COMPLICATIONS: None immediate. PROCEDURE: Informed consent was obtained from the patient following explanation of the procedure, risks, benefits and alternatives. The patient understands, agrees and consents for the procedure. All questions were addressed. Girlie Veltri time out was performed prior to the initiation of the procedure. Maximal barrier sterile technique utilized including caps, mask, sterile gowns, sterile gloves, large sterile drape, hand hygiene, and Betadine prep. The right common femoral artery was interrogated with ultrasound and found to be widely patent. An image was obtained and stored for the medical record. Local anesthesia was attained by infiltration with 1% lidocaine. Zackeriah Kissler small dermatotomy was made. Under real-time sonographic guidance, the vessel was punctured with Lizandra Zakrzewski 21 gauge micropuncture needle. Using standard technique, the initial micro needle was exchanged over Riku Buttery 0.018 micro wire for Jeydi Klingel transitional 4 [place] micro sheath. The micro sheath was then exchanged over Velda Wendt 0.035 wire for Shoshannah Faubert 5 French vascular sheath. Granite Godman C2 cobra slip catheter was advanced up in over the aortic bifurcation into the left internal iliac artery. In internal iliac arteriogram was performed. Diminutive anterior division. No definite recurrent prostatic artery segments. To ensure no significant arterial supply to the left lobe of the prostate artery, Cynthea Zachman Cook cantata 2.5 French microcatheter was advanced into the anterior division and additional digital subtraction angiography was performed. No significant recurrent prostatic arterialization. Lennyx Verdell the C2 cobra catheter was brought back into the ipsilateral common iliac artery and with the assistance of Yuuki Skeens Glidewire used to select the right internal iliac artery. Rony Ratz right internal iliac arteriogram was performed. Prostatic artery is easily visualized arising from the proximal aspect  of the obturator artery. To prevent arterial spasm, 100 mcg intra arterial nitroglycerin was administered. The Cook cantata microcatheter was successfully navigated into the prostatic artery. Contrast injection was performed. There is robust filling of the prostate gland. An additional 100 mcg intra arterial nitroglycerin was administered. Repeat contrast injection was performed confirming no significant collateralization or sources of non target embolization. There is atherosclerotic plaque resulting in narrowing of the mid aspect of the prostatic artery. Particle embolization was then performed using 100-300 micron embospheres. Follow-up arteriography demonstrates significant pruning with decreased flow and no significant parenchymal opacification in the prostatic arteries. To prevent recurrence, coil embolization of the prostatic artery was performed using low-profile Ruby detachable microcoils. Final contrast injection demonstrates technical success. The catheters were removed. Hemostasis was attained with the assistance of Eufemio Strahm Celt arterial closure device. IMPRESSION: 1. Successful combined particle and coil embolization of the previously untreated right prostatic artery. 2. Interrogation of the left prostatic artery demonstrates successful prior embolization without evidence of recurrent vascularity. Signed, Sterling Big, MD, RPVI [facility] Radiology Electronically Signed   By: Malachy Moan M.D.   On: 02/16/2023 11:10   IR Angiogram Pelvis Selective Or Supraselective  Result Date: 02/16/2023 INDICATION: 77 year old male with benign prostatic hyperplasia and recurrent hematuria in the setting of Gaylen Pereira mandatory antiplatelet therapy. He underwent partially successful prostate artery embolization 1 year previously in July of 2023. 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 EXAM: IR  EMBO ARTERIAL NOT HEMORR HEMANG INC GUIDE ROADMAPPING; IR ULTRASOUND GUIDANCE VASC ACCESS RIGHT; PELVIC SELECTIVE ARTERIOGRAPHY MEDICATIONS: Bactrim DS was administered p.o. prior to the procedure. ANESTHESIA/SEDATION: Moderate (conscious) sedation was employed during this procedure. Anddy Wingert total of Versed 1.5 mg and Fentanyl 75 mcg was administered intravenously. Moderate Sedation Time: 62 minutes. The patient's level of consciousness and vital signs were monitored continuously by radiology nursing throughout the procedure under my direct supervision. CONTRAST:  <See Chart> OMNIPAQUE IOHEXOL 300 MG/ML SOLN, 45mL OMNIPAQUE IOHEXOL 300 MG/ML SOLN, 45mL OMNIPAQUE IOHEXOL 300 MG/ML SOLN FLUOROSCOPY: Radiation Exposure Index (as provided by the fluoroscopic device): 426 mGy Kerma COMPLICATIONS: None immediate. PROCEDURE: Informed consent was obtained from the patient following explanation of the procedure, risks, benefits and alternatives. The patient understands, agrees and consents for the procedure. All questions were addressed. Randale Carvalho time out was performed prior to the initiation of the procedure. Maximal barrier sterile technique utilized including caps, mask, sterile gowns, sterile gloves, large sterile drape, hand hygiene, and Betadine prep. The right common femoral artery was interrogated with ultrasound and found to be widely patent. An image was obtained and stored for the medical record. Local anesthesia was attained by infiltration with 1% lidocaine. Madellyn Denio small dermatotomy was made. Under real-time sonographic guidance, the vessel was punctured with Brookelin Felber 21 gauge micropuncture needle. Using standard technique, the initial micro needle was exchanged over Kol Consuegra 0.018 micro wire for Maylen Waltermire transitional 4 [place] micro sheath. The micro sheath was then exchanged over Carlton Buskey 0.035 wire for Yasmyn Bellisario 5 French vascular sheath. Breshay Ilg C2 cobra slip catheter was advanced up in over the aortic bifurcation into the left internal iliac artery. In internal iliac  arteriogram was performed. Diminutive anterior division. No definite recurrent prostatic artery segments. To ensure no significant arterial supply to the left lobe of the prostate artery, Gaylia Kassel Cook cantata 2.5 French microcatheter was advanced into the anterior division and additional digital subtraction angiography was performed. No significant recurrent prostatic arterialization. Xzavior Reinig the C2 cobra catheter was brought back into the ipsilateral common iliac artery and with the assistance of Brandyn Thien Glidewire used to select the right internal iliac artery. Jonny Longino right internal iliac arteriogram was performed. Prostatic artery is easily visualized arising from the proximal aspect of the obturator artery. To prevent arterial spasm, 100 mcg intra arterial nitroglycerin was administered. The Cook cantata microcatheter was successfully navigated into the prostatic artery. Contrast injection was performed. There is robust filling of the prostate gland. An additional 100 mcg intra arterial nitroglycerin was administered. Repeat contrast injection was performed confirming no significant collateralization or sources of non target embolization. There is atherosclerotic plaque resulting in narrowing of the mid aspect of the prostatic artery. Particle embolization was then performed using 100-300 micron embospheres. Follow-up arteriography demonstrates significant pruning with decreased flow and no significant parenchymal opacification in the prostatic arteries. To prevent recurrence, coil embolization of the prostatic artery was performed using low-profile Ruby detachable microcoils. Final contrast injection demonstrates technical success. The catheters were removed. Hemostasis was attained with the assistance  of Mileydi Milsap Celt arterial closure device. IMPRESSION: 1. Successful combined particle and coil embolization of the previously untreated right prostatic artery. 2. Interrogation of the left prostatic artery demonstrates successful prior  embolization without evidence of recurrent vascularity. Signed, Sterling Big, MD, RPVI [facility] Radiology Electronically Signed   By: Malachy Moan M.D.   On: 02/16/2023 11:10   IR EMBO ARTERIAL NOT HEMORR HEMANG INC GUIDE ROADMAPPING  Result Date: 02/16/2023 INDICATION: 77 year old male with benign prostatic hyperplasia and recurrent hematuria in the setting of Avital Dancy mandatory antiplatelet therapy. He underwent partially successful prostate artery embolization 1 year previously in July of 2023. 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 EXAM: IR EMBO ARTERIAL NOT HEMORR HEMANG INC GUIDE ROADMAPPING; IR ULTRASOUND GUIDANCE VASC ACCESS RIGHT; PELVIC SELECTIVE ARTERIOGRAPHY MEDICATIONS: Bactrim DS was administered p.o. prior to the procedure. ANESTHESIA/SEDATION: Moderate (conscious) sedation was employed during this procedure. Aisley Whan total of Versed 1.5 mg and Fentanyl 75 mcg was administered intravenously. Moderate Sedation Time: 62 minutes. The patient's level of consciousness and vital signs were monitored continuously by radiology nursing throughout the procedure under my direct supervision. CONTRAST:  <See Chart> OMNIPAQUE IOHEXOL 300 MG/ML SOLN, 45mL OMNIPAQUE IOHEXOL 300 MG/ML SOLN, 45mL OMNIPAQUE IOHEXOL 300 MG/ML SOLN FLUOROSCOPY: Radiation Exposure Index (as provided by the fluoroscopic device): 426 mGy Kerma COMPLICATIONS: None immediate. PROCEDURE: Informed consent was obtained from the patient following explanation of the procedure, risks, benefits and alternatives. The patient understands, agrees and consents for the procedure. All questions were addressed. Magdelene Ruark time out was performed prior to the initiation of the procedure. Maximal barrier sterile technique utilized including caps, mask, sterile gowns, sterile gloves, large sterile drape, hand hygiene, and Betadine prep. The  right common femoral artery was interrogated with ultrasound and found to be widely patent. An image was obtained and stored for the medical record. Local anesthesia was attained by infiltration with 1% lidocaine. Emmitt Matthews small dermatotomy was made. Under real-time sonographic guidance, the vessel was punctured with Larken Urias 21 gauge micropuncture needle. Using standard technique, the initial micro needle was exchanged over Izayiah Tibbitts 0.018 micro wire for Chinenye Katzenberger transitional 4 [place] micro sheath. The micro sheath was then exchanged over Bird Swetz 0.035 wire for Lailynn Southgate 5 French vascular sheath. Kyen Taite C2 cobra slip catheter was advanced up in over the aortic bifurcation into the left internal iliac artery. In internal iliac arteriogram was performed. Diminutive anterior division. No definite recurrent prostatic artery segments. To ensure no significant arterial supply to the left lobe of the prostate artery, Ami Mally Cook cantata 2.5 French microcatheter was advanced into the anterior division and additional digital subtraction angiography was performed. No significant recurrent prostatic arterialization. Chyna Kneece the C2 cobra catheter was brought back into the ipsilateral common iliac artery and with the assistance of Aniyia Rane Glidewire used to select the right internal iliac artery. Nakiya Rallis right internal iliac arteriogram was performed. Prostatic artery is easily visualized arising from the proximal aspect of the obturator artery. To prevent arterial spasm, 100 mcg intra arterial nitroglycerin was administered. The Cook cantata microcatheter was successfully navigated into the prostatic artery. Contrast injection was performed. There is robust filling of the prostate gland. An additional 100 mcg intra arterial nitroglycerin was administered. Repeat contrast injection was performed confirming no significant collateralization or sources of non target embolization. There is atherosclerotic plaque resulting in narrowing of the mid aspect of the prostatic artery. Particle embolization was  then performed using 100-300 micron embospheres.  Follow-up arteriography demonstrates significant pruning with decreased flow and no significant parenchymal opacification in the prostatic arteries. To prevent recurrence, coil embolization of the prostatic artery was performed using low-profile Ruby detachable microcoils. Final contrast injection demonstrates technical success. The catheters were removed. Hemostasis was attained with the assistance of Paxton Binns Celt arterial closure device. IMPRESSION: 1. Successful combined particle and coil embolization of the previously untreated right prostatic artery. 2. Interrogation of the left prostatic artery demonstrates successful prior embolization without evidence of recurrent vascularity. Signed, Sterling Big, MD, RPVI [facility] Radiology Electronically Signed   By: Malachy Moan M.D.   On: 02/16/2023 11:10   IR US Guide Vasc Access Right  Result Date: 02/16/2023 INDICATION: 77 year old male with benign prostatic hyperplasia and recurrent hematuria in the setting of Daion Ginsberg mandatory antiplatelet therapy. He underwent partially successful prostate artery embolization 1 year previously in July of 2023. 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 EXAM: IR EMBO ARTERIAL NOT HEMORR HEMANG INC GUIDE ROADMAPPING; IR ULTRASOUND GUIDANCE VASC ACCESS RIGHT; PELVIC SELECTIVE ARTERIOGRAPHY MEDICATIONS: Bactrim DS was administered p.o. prior to the procedure. ANESTHESIA/SEDATION: Moderate (conscious) sedation was employed during this procedure. Lucillie Kiesel total of Versed 1.5 mg and Fentanyl 75 mcg was administered intravenously. Moderate Sedation Time: 62 minutes. The patient's level of consciousness and vital signs were monitored continuously by radiology nursing throughout the procedure under my direct supervision. CONTRAST:  <See Chart> OMNIPAQUE IOHEXOL 300 MG/ML  SOLN, 45mL OMNIPAQUE IOHEXOL 300 MG/ML SOLN, 45mL OMNIPAQUE IOHEXOL 300 MG/ML SOLN FLUOROSCOPY: Radiation Exposure Index (as provided by the fluoroscopic device): 426 mGy Kerma COMPLICATIONS: None immediate. PROCEDURE: Informed consent was obtained from the patient following explanation of the procedure, risks, benefits and alternatives. The patient understands, agrees and consents for the procedure. All questions were addressed. Tinia Oravec time out was performed prior to the initiation of the procedure. Maximal barrier sterile technique utilized including caps, mask, sterile gowns, sterile gloves, large sterile drape, hand hygiene, and Betadine prep. The right common femoral artery was interrogated with ultrasound and found to be widely patent. An image was obtained and stored for the medical record. Local anesthesia was attained by infiltration with 1% lidocaine. Eulalia Ellerman small dermatotomy was made. Under real-time sonographic guidance, the vessel was punctured with Ashtin Rosner 21 gauge micropuncture needle. Using standard technique, the initial micro needle was exchanged over Nini Cavan 0.018 micro wire for Mariana Goytia transitional 4 [place] micro sheath. The micro sheath was then exchanged over Rhyan Wolters 0.035 wire for Reality Dejonge 5 French vascular sheath. Yue Glasheen C2 cobra slip catheter was advanced up in over the aortic bifurcation into the left internal iliac artery. In internal iliac arteriogram was performed. Diminutive anterior division. No definite recurrent prostatic artery segments. To ensure no significant arterial supply to the left lobe of the prostate artery, Tiwanda Threats Cook cantata 2.5 French microcatheter was advanced into the anterior division and additional digital subtraction angiography was performed. No significant recurrent prostatic arterialization. Herny Scurlock the C2 cobra catheter was brought back into the ipsilateral common iliac artery and with the assistance of Bergen Magner Glidewire used to select the right internal iliac artery. Linton Stolp right internal iliac arteriogram was performed.  Prostatic artery is easily visualized arising from the proximal aspect of the obturator artery. To prevent arterial spasm, 100 mcg intra arterial nitroglycerin was administered. The Cook cantata microcatheter was successfully navigated into the prostatic artery. Contrast injection was performed. There is robust filling of the prostate gland. An additional 100  mcg intra arterial nitroglycerin was administered. Repeat contrast injection was performed confirming no significant collateralization or sources of non target embolization. There is atherosclerotic plaque resulting in narrowing of the mid aspect of the prostatic artery. Particle embolization was then performed using 100-300 micron embospheres. Follow-up arteriography demonstrates significant pruning with decreased flow and no significant parenchymal opacification in the prostatic arteries. To prevent recurrence, coil embolization of the prostatic artery was performed using low-profile Ruby detachable microcoils. Final contrast injection demonstrates technical success. The catheters were removed. Hemostasis was attained with the assistance of Lissie Hinesley Celt arterial closure device. IMPRESSION: 1. Successful combined particle and coil embolization of the previously untreated right prostatic artery. 2. Interrogation of the left prostatic artery demonstrates successful prior embolization without evidence of recurrent vascularity. Signed, Sterling Big, MD, RPVI [facility] Radiology Electronically Signed   By: Malachy Moan M.D.   On: 02/16/2023 11:10    Microbiology: Recent Results (from the past 240 hour(s))  Urine Culture     Status: Abnormal   Collection Time: 02/15/23  9:19 AM   Specimen: Urine, Random  Result Value Ref Range Status   Specimen Description   Final    URINE, RANDOM Performed at [facility], [address], [place], [place] [address]    Special Requests   Final     NONE Reflexed from [id number] Performed at [facility], [address], [place], [place] [address]    Culture (Jeniece Hannis)  Final    >=100,000 COLONIES/mL CITROBACTER KOSERI 50,000 COLONIES/mL PSEUDOMONAS AERUGINOSA 50,000 COLONIES/mL ESCHERICHIA COLI    Report Status 02/18/2023 FINAL  Final   Organism ID, Bacteria CITROBACTER KOSERI (Koven Belinsky)  Final   Organism ID, Bacteria PSEUDOMONAS AERUGINOSA (Theodosia Bahena)  Final   Organism ID, Bacteria ESCHERICHIA COLI (Merary Garguilo)  Final      Susceptibility   Citrobacter koseri - MIC*    CEFEPIME <=0.12 SENSITIVE Sensitive     CEFTRIAXONE <=0.25 SENSITIVE Sensitive     CIPROFLOXACIN <=0.25 SENSITIVE Sensitive     GENTAMICIN <=1 SENSITIVE Sensitive     IMIPENEM <=0.25 SENSITIVE Sensitive     NITROFURANTOIN 32 SENSITIVE Sensitive     TRIMETH/SULFA <=20 SENSITIVE Sensitive     PIP/TAZO <=4 SENSITIVE Sensitive     * >=100,000 COLONIES/mL CITROBACTER KOSERI   Escherichia coli - MIC*    AMPICILLIN 8 SENSITIVE Sensitive     CEFAZOLIN <=4 SENSITIVE Sensitive     CEFEPIME <=0.12 SENSITIVE Sensitive     CEFTRIAXONE <=0.25 SENSITIVE Sensitive     CIPROFLOXACIN <=0.25 SENSITIVE Sensitive     GENTAMICIN <=1 SENSITIVE Sensitive     IMIPENEM <=0.25 SENSITIVE Sensitive     NITROFURANTOIN <=16 SENSITIVE Sensitive     TRIMETH/SULFA <=20 SENSITIVE Sensitive     AMPICILLIN/SULBACTAM 4 SENSITIVE Sensitive     PIP/TAZO <=4 SENSITIVE Sensitive     * 50,000 COLONIES/mL ESCHERICHIA COLI   Pseudomonas aeruginosa - MIC*    CEFTAZIDIME 32 RESISTANT Resistant     CIPROFLOXACIN <=0.25 SENSITIVE Sensitive     GENTAMICIN <=1 SENSITIVE Sensitive     IMIPENEM 1 SENSITIVE Sensitive     * 50,000 COLONIES/mL PSEUDOMONAS AERUGINOSA     Labs: Basic Metabolic Panel: Recent Labs  Lab 02/21/23 0515 02/22/23 0456 02/23/23 0642  NA 134* 135 134*  K 4.2 4.8 4.3  CL 102 101 103  CO2 25 25 25   GLUCOSE 99 100* 93  BUN 17 15 18   CREATININE 1.17 1.03 1.06  CALCIUM 8.4*  8.7* 8.4*  MG  2.1 1.9 1.9  PHOS 3.8 4.2 3.3   Liver Function Tests: No results for input(s): "AST", "ALT", "ALKPHOS", "BILITOT", "PROT", "ALBUMIN" in the last 168 hours. No results for input(s): "LIPASE", "AMYLASE" in the last 168 hours. No results for input(s): "AMMONIA" in the last 168 hours. CBC: Recent Labs  Lab 02/19/23 0543 02/20/23 0529 02/21/23 0515 02/21/23 1112 02/22/23 0456 02/23/23 0642 02/23/23 1229  WBC 11.8* 12.2* 9.5  --  9.4 7.6  --   HGB 7.8* 7.5* 7.1* 6.8* 8.3* 7.9* 7.9*  HCT 26.0* 24.7* 22.9* 22.1* 26.9* 26.0* 25.6*  MCV 93.2 94.3 93.5  --  92.4 92.9  --   PLT 259 275 270  --  281 304  --    Cardiac Enzymes: No results for input(s): "CKTOTAL", "CKMB", "CKMBINDEX", "TROPONINI" in the last 168 hours. BNP: BNP (last 3 results) No results for input(s): "BNP" in the last 8760 hours.  ProBNP (last 3 results) No results for input(s): "PROBNP" in the last 8760 hours.  CBG: No results for input(s): "GLUCAP" in the last 168 hours.     Signed:  Lacretia Nicks MD.  [facility] 02/23/2023, 1:18 PM

## 2023-03-27 ENCOUNTER — Ambulatory Visit (HOSPITAL_COMMUNITY): Admission: RE | Admit: 2023-03-27 | Payer: Medicare HMO | Source: Ambulatory Visit

## 2023-03-27 ENCOUNTER — Ambulatory Visit: Payer: Managed Care, Other (non HMO) | Admitting: Vascular Surgery

## 2023-10-08 ENCOUNTER — Emergency Department (HOSPITAL_COMMUNITY)

## 2023-10-08 ENCOUNTER — Inpatient Hospital Stay (HOSPITAL_COMMUNITY)
Admission: EM | Admit: 2023-10-08 | Discharge: 2023-10-11 | DRG: 669 | Disposition: A | Discharge status: Home / Self Care | Attending: Internal Medicine | Admitting: Internal Medicine

## 2023-10-08 ENCOUNTER — Encounter (HOSPITAL_COMMUNITY): Payer: Self-pay

## 2023-10-08 ENCOUNTER — Other Ambulatory Visit: Payer: Self-pay

## 2023-10-08 DIAGNOSIS — I2510 Atherosclerotic heart disease of native coronary artery without angina pectoris: Secondary | ICD-10-CM | POA: Diagnosis present

## 2023-10-08 DIAGNOSIS — I480 Paroxysmal atrial fibrillation: Secondary | ICD-10-CM | POA: Diagnosis present

## 2023-10-08 DIAGNOSIS — I10 Essential (primary) hypertension: Secondary | ICD-10-CM | POA: Diagnosis present

## 2023-10-08 DIAGNOSIS — D62 Acute posthemorrhagic anemia: Secondary | ICD-10-CM | POA: Diagnosis present

## 2023-10-08 DIAGNOSIS — Z801 Family history of malignant neoplasm of trachea, bronchus and lung: Secondary | ICD-10-CM

## 2023-10-08 DIAGNOSIS — N3041 Irradiation cystitis with hematuria: Secondary | ICD-10-CM | POA: Diagnosis not present

## 2023-10-08 DIAGNOSIS — I700 Atherosclerosis of aorta: Secondary | ICD-10-CM | POA: Diagnosis present

## 2023-10-08 DIAGNOSIS — C61 Malignant neoplasm of prostate: Secondary | ICD-10-CM | POA: Diagnosis present

## 2023-10-08 DIAGNOSIS — R310 Gross hematuria: Secondary | ICD-10-CM | POA: Diagnosis not present

## 2023-10-08 DIAGNOSIS — Z7901 Long term (current) use of anticoagulants: Secondary | ICD-10-CM

## 2023-10-08 DIAGNOSIS — R319 Hematuria, unspecified: Secondary | ICD-10-CM | POA: Diagnosis not present

## 2023-10-08 DIAGNOSIS — E119 Type 2 diabetes mellitus without complications: Secondary | ICD-10-CM | POA: Diagnosis present

## 2023-10-08 DIAGNOSIS — Z9842 Cataract extraction status, left eye: Secondary | ICD-10-CM

## 2023-10-08 DIAGNOSIS — Z9841 Cataract extraction status, right eye: Secondary | ICD-10-CM

## 2023-10-08 DIAGNOSIS — Z87891 Personal history of nicotine dependence: Secondary | ICD-10-CM

## 2023-10-08 DIAGNOSIS — N400 Enlarged prostate without lower urinary tract symptoms: Secondary | ICD-10-CM | POA: Diagnosis present

## 2023-10-08 DIAGNOSIS — Z79899 Other long term (current) drug therapy: Secondary | ICD-10-CM

## 2023-10-08 DIAGNOSIS — Z95820 Peripheral vascular angioplasty status with implants and grafts: Secondary | ICD-10-CM

## 2023-10-08 LAB — CBG MONITORING, ED
Glucose-Capillary: 83 mg/dL (ref 70–99)
Glucose-Capillary: 82 mg/dL (ref 70–99)
Glucose-Capillary: 80 mg/dL (ref 70–99)

## 2023-10-08 LAB — CBC
HCT: 25.1 % — ABNORMAL LOW (ref 39.0–52.0)
Hemoglobin: 7.5 g/dL — ABNORMAL LOW (ref 13.0–17.0)
MCH: 29 pg (ref 26.0–34.0)
MCHC: 29.9 g/dL — ABNORMAL LOW (ref 30.0–36.0)
MCV: 96.9 fL (ref 80.0–100.0)
Platelets: 305 10*3/uL (ref 150–400)
RBC: 2.59 MIL/uL — ABNORMAL LOW (ref 4.22–5.81)
RDW: 16.9 % — ABNORMAL HIGH (ref 11.5–15.5)
WBC: 6.1 10*3/uL (ref 4.0–10.5)
nRBC: 0 % (ref 0.0–0.2)

## 2023-10-08 LAB — BASIC METABOLIC PANEL WITH GFR
Anion gap: 7 (ref 5–15)
BUN: 14 mg/dL (ref 8–23)
CO2: 24 mmol/L (ref 22–32)
Calcium: 8.7 mg/dL — ABNORMAL LOW (ref 8.9–10.3)
Chloride: 107 mmol/L (ref 98–111)
Creatinine, Ser: 1.09 mg/dL (ref 0.61–1.24)
GFR, Estimated: >60 mL/min (ref >60)
Glucose, Bld: 112 mg/dL — ABNORMAL HIGH (ref 70–99)
Potassium: 4 mmol/L (ref 3.5–5.1)
Sodium: 138 mmol/L (ref 135–145)

## 2023-10-08 LAB — URINALYSIS, ROUTINE W REFLEX MICROSCOPIC
RBC / HPF: >50 RBC/hpf (ref 0–5)
WBC, UA: >50 WBC/hpf (ref 0–5)

## 2023-10-08 LAB — HEMOGLOBIN AND HEMATOCRIT, BLOOD
HCT: 31.2 % — ABNORMAL LOW (ref 39.0–52.0)
Hemoglobin: 9.2 g/dL — ABNORMAL LOW (ref 13.0–17.0)

## 2023-10-08 LAB — PREPARE RBC (CROSSMATCH)

## 2023-10-08 MED ORDER — INSULIN ASPART 100 UNIT/ML IJ SOLN
0.0000 [IU] | Freq: Three times a day (TID) | INTRAMUSCULAR | Status: DC
  Administered 2023-10-10 – 2023-10-11 (×2): 1 [IU] via SUBCUTANEOUS
  Filled 2023-10-08: qty 0.09

## 2023-10-08 MED ORDER — SODIUM CHLORIDE 0.9% IV SOLUTION: Freq: Once | INTRAVENOUS | Status: AC

## 2023-10-08 MED ORDER — FINASTERIDE 5 MG PO TABS
5.0000 mg | ORAL_TABLET | Freq: Every day | ORAL | Status: DC
  Administered 2023-10-08 – 2023-10-11 (×3): 5 mg via ORAL
  Filled 2023-10-08 (×3): qty 1

## 2023-10-08 MED ORDER — ACETAMINOPHEN 650 MG RE SUPP: 650.0000 mg | Freq: Four times a day (QID) | RECTAL | Status: DC | PRN

## 2023-10-08 MED ORDER — LACTATED RINGERS IV SOLN: INTRAVENOUS | Status: AC

## 2023-10-08 MED ORDER — FERROUS SULFATE 325 (65 FE) MG PO TABS
325.0000 mg | ORAL_TABLET | ORAL | Status: DC
  Administered 2023-10-09 – 2023-10-11 (×2): 325 mg via ORAL
  Filled 2023-10-08 (×2): qty 1

## 2023-10-08 MED ORDER — ONDANSETRON HCL 4 MG PO TABS: 4.0000 mg | ORAL_TABLET | Freq: Four times a day (QID) | ORAL | Status: DC | PRN

## 2023-10-08 MED ORDER — SODIUM CHLORIDE (PF) 0.9 % IJ SOLN
INTRAMUSCULAR | Status: AC
  Filled 2023-10-08: qty 50

## 2023-10-08 MED ORDER — ALBUTEROL SULFATE (2.5 MG/3ML) 0.083% IN NEBU: 2.5000 mg | INHALATION_SOLUTION | Freq: Four times a day (QID) | RESPIRATORY_TRACT | Status: DC | PRN

## 2023-10-08 MED ORDER — ONDANSETRON HCL 4 MG/2ML IJ SOLN: 4.0000 mg | Freq: Four times a day (QID) | INTRAMUSCULAR | Status: DC | PRN

## 2023-10-08 MED ORDER — SODIUM CHLORIDE 0.9 % IV SOLN
1.0000 g | INTRAVENOUS | Status: DC
  Administered 2023-10-08 – 2023-10-10 (×3): 1 g via INTRAVENOUS
  Filled 2023-10-08 (×4): qty 10

## 2023-10-08 MED ORDER — MAGNESIUM OXIDE -MG SUPPLEMENT 400 (240 MG) MG PO TABS
400.0000 mg | ORAL_TABLET | Freq: Every day | ORAL | Status: DC
  Administered 2023-10-08 – 2023-10-11 (×3): 400 mg via ORAL
  Filled 2023-10-08 (×3): qty 1

## 2023-10-08 MED ORDER — GABAPENTIN 300 MG PO CAPS
300.0000 mg | ORAL_CAPSULE | Freq: Three times a day (TID) | ORAL | Status: DC
  Administered 2023-10-08 – 2023-10-11 (×9): 300 mg via ORAL
  Filled 2023-10-08 (×9): qty 1

## 2023-10-08 MED ORDER — IOHEXOL 300 MG/ML  SOLN
100.0000 mL | Freq: Once | INTRAMUSCULAR | Status: AC | PRN
  Administered 2023-10-08: 100 mL via INTRAVENOUS

## 2023-10-08 MED ORDER — ACETAMINOPHEN 325 MG PO TABS: 650.0000 mg | ORAL_TABLET | Freq: Four times a day (QID) | ORAL | Status: DC | PRN

## 2023-10-08 MED ORDER — FOLIC ACID 1 MG PO TABS
1.0000 mg | ORAL_TABLET | Freq: Every day | ORAL | Status: DC
  Administered 2023-10-08 – 2023-10-11 (×3): 1 mg via ORAL
  Filled 2023-10-08 (×3): qty 1

## 2023-10-08 MED ORDER — TRANEXAMIC ACID-NACL 1000-0.7 MG/100ML-% IV SOLN
1000.0000 mg | Freq: Once | INTRAVENOUS | Status: AC
  Administered 2023-10-08: 1000 mg via INTRAVENOUS
  Filled 2023-10-08: qty 100

## 2023-10-08 NOTE — ED Triage Notes (Signed)
 Pt arrived form home with wife after getting a phone call around 1am stating his labs indicated that his hemoglobin had drop 3 points and was at a 7. Pt was seen at urology for his ongoing prostate issues, has been having hematuria for at least 2 weeks and they changed out his foley catheter today, which is draining well, but noted to be blood tinge urine. Pt denies abd pain, no weakness, SOB or CP. VSS, NAD note, A&O x4, ambulatory w/o difficulty to triage.

## 2023-10-08 NOTE — Hospital Course (Addendum)
 78 y.o.m history of prostate cancer-S/P XRT in 2021 , hypertension, diabetes, history of A-fib, DVT no longer on anticoagulation, chronic SP catheter, history of right iliac stent last admission 02/23/23 for UTI with catheter, hematuria/ABLA needing 3 units PRBC and coil embolization right prostatic artery.  History of CAD with drop in hemoglobin 3.8 and hematuria for at least 2 weeks.  Foley catheter changed by urology on 4/7 and blood work obtained. In the ED-hemodynamically stable afebrile.  Labs hemoglobin down to 7.5g b/l ~7.9 on 02/23/2023,UA WBC >50 RBC >50 bacteria few Urology consulted and admission requested under [facility].  Significant testing/procedure: 4/8>CT abdomen pelvis with contrast>> enlarged prostate extending into the urinary bladder consistent with prostate cancer SP catheter in place aortic atherosclerosis  Consultation: Urology  Subjective Seen and examined Resting comfortably Suprapubic catheter with hematuria history of present Overnight afebrile BP stable hemoglobin stable at 8.7  N.p.o. 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 S/p Catheter irrigated by urology.  Tentative plan for OR  4/10 for cystoscopy clot evacuation and fulguration Continue empiric antibiotics.  ABLA on chronic anemia: Due to hematuria S/P 1 unit PRBC,  hb is stable  Recent Labs  Lab 10/09/23 0639 10/09/23 1252 10/09/23 1823 10/10/23 0202 10/10/23 0809  HGB 8.0* 9.0* 9.0* 8.7* 8.1*  HCT 26.9* 30.3* 29.7* 28.4* 26.6*    Hypertension: BP stable. hold Aldactone - seems not taking it.  T2DM: Blood sugar controlled on ssi, monitor. Recent Labs  Lab 10/08/23 2302 10/09/23 0741 10/09/23 1147 10/09/23 1650 10/09/23 2052  10/10/23 0748  GLUCAP  --  88 132* 108* 94 95  HGBA1C 5.3  --   --   --   --   --     History of A-fib DVT history of right iliac stent: no longer on anticoagulation

## 2023-10-08 NOTE — Consult Note (Signed)
 Urology Consult Note   Requesting Attending Physician:  Maia Plan, MD Service Providing Consult: Urology  Consulting Attending: Dr. Margo Aye   Reason for Consult:  hematuria  HPI: Nathan [name] is seen in consultation for reasons noted above at the request of Nathan, [name], MD. 78 y.o. male with hematuria and ABLA.  Nathan. [name] is well-known to our practice and has been followed by Dr. Laverle Patter for some time. He has a Nathan hx of hematuria, but has been well controlled for the last 8 months following the embolization of his other prostatic artery.    PMH significant for prostate cancer-S/P XRT in 2021, hematuria, SPT, and PAE.  His daughter has taken excellent care of him.  His SPT insertion site has healed beautifully with no erythema or purulent drainage.  She had irrigates frequently but states she has little to no improvement in his hematuria.  He remains on finasteride.  He has completed  hyperbaric oxygen treatment.  He is thankfully off of Plavix.  My arrival today patient had medium clear red urine with some clot material in his suprapubic tube.  I hand irrigated during assessment and removed several clots and a couple 100 cc of drainage.  He was hand irrigated to clear with quick return of hematuria.  He has had significant blood loss, now requiring transfusion.  He is alert, oriented, and in no distress.      ------------------  Assessment:  78 y.o. male with hematuria   Recommendations: # Prostate cancer s/p XRT # S/p prostatic artery embolization  CT A/P shows significant encroachment of the prostate into the bladder, similar to imaging last year.  SPT intermittently clot obstructing.  Hand irrigate as needed.  I am not sure where patient may be bleeding from at this time considering his primary source, his prostate, has been embolized.  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   His bleeding is mild to moderate, but has  unfortunately been going on for some time.  He has not seen much benefit from cystoscopy with fulguration in the past, though this will be considered if he fails conservative treatment.  At this stage we will encourage IV hydration, empiric UTI treatment, and have provided a dose of TXA.   Case and plan discussed with Dr. Laverle Patter  Past Medical History: Past Medical History:  Diagnosis Date   Anginal pain (HCC)    Atrial fibrillation (HCC) 01/2020   resolved now    Cancer of prostate (HCC) 2020   Chronic anticoagulation    Diabetes mellitus without complication (HCC)    type 2 diet controlled   Dysrhythmia    a fib   Foley catheter in place    will be changed 05-16-20   HTN (hypertension)    Lipoma of chest wall    Pneumonia 01/2020   and uti in hospital for 1 week   WPW (Wolff-Parkinson-White syndrome)    No prior ablation    Past Surgical History:  Past Surgical History:  Procedure Laterality Date   CATARACT EXTRACTION Bilateral 2020   CYSTOSCOPY N/A 06/05/2021   Procedure: CYSTOSCOPY WITH SUPRA PUBIC TUBE PLACEMENT;  Surgeon: Heloise Purpura, MD;  Location: [facility];  Service: Urology;  Laterality: N/A;   CYSTOSCOPY WITH FULGERATION N/A 01/27/2022   Procedure: CYSTOSCOPY WITH FULGERATION WITH CLOT EVACUATION;  Surgeon: Crist Fat, MD;  Location: [facility];  Service: Urology;  Laterality: N/A;   CYSTOSCOPY WITH FULGERATION N/A 03/02/2022  Procedure: CYSTOSCOPY, CLOT EVACUTION WITH TRANSURETHERAL FULGERATION OF BLADDER;  Surgeon: Heloise Purpura, MD;  Location: [facility];  Service: Urology;  Laterality: N/A;   IR 3D INDEPENDENT WKST  01/17/2022   IR ANGIOGRAM PELVIS SELECTIVE OR SUPRASELECTIVE  01/17/2022   IR ANGIOGRAM PELVIS SELECTIVE OR SUPRASELECTIVE  02/16/2023   IR ANGIOGRAM PELVIS SELECTIVE OR SUPRASELECTIVE  02/16/2023   IR ANGIOGRAM SELECTIVE EACH ADDITIONAL VESSEL  01/17/2022   IR ANGIOGRAM SELECTIVE EACH ADDITIONAL VESSEL  01/17/2022   IR EMBO ART  VEN HEMORR LYMPH EXTRAV   INC GUIDE ROADMAPPING  01/17/2022   IR EMBO ARTERIAL NOT HEMORR HEMANG INC GUIDE ROADMAPPING  02/16/2023   IR US GUIDE VASC ACCESS RIGHT  01/17/2022   IR US GUIDE VASC ACCESS RIGHT  02/16/2023   LIPOMA EXCISION Left 05/16/2020   Procedure: EXCISION LIPOMA LEFT CHEST WALL;  Surgeon: Sheliah Hatch De Blanch, MD;  Location: [facility];  Service: General;  Laterality: Left;   LOWER EXTREMITY VENOGRAPHY Right 09/17/2022   Procedure: LOWER EXTREMITY VENOGRAPHY;  Surgeon: Maeola Harman, MD;  Location: [facility] INVASIVE CV LAB;  Service: Cardiovascular;  Laterality: Right;   PERIPHERAL VASCULAR INTERVENTION  09/17/2022   Procedure: PERIPHERAL VASCULAR INTERVENTION;  Surgeon: Maeola Harman, MD;  Location: [facility] CV LAB;  Service: Cardiovascular;;   RADIOACTIVE SEED IMPLANT  2020   TRANSURETHRAL RESECTION OF BLADDER TUMOR N/A 01/17/2022   Procedure: CYSTOSCOPY WITH CLOT EVACUATION, FULGERATION OF PROSTATE;  Surgeon: Jerilee Field, MD;  Location: [facility];  Service: Urology;  Laterality: N/A;   VENA CAVA FILTER PLACEMENT Right 12/10/2021   Procedure: INSERTION VENA-CAVA FILTER Right Femoral;  Surgeon: Maeola Harman, MD;  Location: [facility];  Service: Vascular;  Laterality: Right;    Medication: Current Facility-Administered Medications  Medication Dose Route Frequency Provider Last Rate Last Admin   0.9 %  sodium chloride infusion (Manually program via Guardrails IV Fluids)   Intravenous Once Cardama, Amadeo Garnet, MD       Current Outpatient Medications  Medication Sig Dispense Refill   albuterol (VENTOLIN HFA) 108 (90 Base) MCG/ACT inhaler Inhale into the lungs.     ergocalciferol (VITAMIN D2) 1.25 MG (50000 UT) capsule Take 50,000 Units by mouth once a week. Patient usually takes this Sundays     finasteride (PROSCAR) 5 MG tablet Take 5 mg by mouth daily.     folic acid (FOLVITE) 1 MG tablet Take 1 mg by mouth daily.     gabapentin (NEURONTIN) 300 MG  capsule Take 300 mg by mouth 3 (three) times daily.     magnesium oxide (MAG-OX) 400 MG tablet Take 400 mg by mouth daily.      OVER THE COUNTER MEDICATION Take 1 capsule by mouth daily as needed (Siatic Nerve pain). Sciatiease     OVER THE COUNTER MEDICATION Take 1 capsule by mouth daily. Methylate 1 daily     Prenatal Vit-Fe Fumarate-FA (PRENATAL MULTIVITAMIN) TABS tablet Take 1 tablet by mouth daily at 12 noon.     spironolactone (ALDACTONE) 25 MG tablet Take 25 mg by mouth daily.     ferrous sulfate 325 (65 FE) MG tablet Take 1 tablet (325 mg total) by mouth every other day. 15 tablet 1   Omega-3 Fatty Acids (FISH OIL) 1000 MG CAPS Take 1,000 mg by mouth daily. (Patient not taking: Reported on 10/08/2023)      Allergies: No Known Allergies  Social History: Social History   Tobacco Use   Smoking status: Former  Current packs/day: 0.00    Average packs/day: 0.5 packs/day for 60.0 years (30.0 ttl pk-yrs)    Types: Cigarettes    Start date: 01/31/1959    Quit date: 01/31/2019    Years since quitting: 4.6   Smokeless tobacco: Never  Vaping Use   Vaping status: Never Used  Substance Use Topics   Alcohol use: Not Currently    Comment: no alochol since 2020   Drug use: Never    Family History Family History  Problem Relation Age of Onset   Lung cancer Mother    Cervical cancer Sister    Breast cancer Neg Hx    Pancreatic cancer Neg Hx    Colon cancer Neg Hx     ROS   Objective   Vital signs in last 24 hours: BP 131/69 (BP Location: Right Arm)   Pulse 73   Temp 98 F (36.7 C) (Oral)   Resp 20   Ht 6\' 1"  (1.854 m)   Wt 77.2 kg   SpO2 99%   BMI 22.45 kg/m   Physical Exam General: A&O, resting, appropriate HEENT: [place]/AT Pulmonary: Normal work of breathing Cardiovascular: no cyanosis Abdomen: Soft, NTTP, nondistended GU: SPT- medium clear red urine with some clot material Neuro: Appropriate, no focal neurological deficits  Most Recent Labs: Lab Results   Component Value Date   WBC 6.1 10/08/2023   HGB 7.5 (L) 10/08/2023   HCT 25.1 (L) 10/08/2023   PLT 305 10/08/2023    Lab Results  Component Value Date   NA 138 10/08/2023   K 4.0 10/08/2023   CL 107 10/08/2023   CO2 24 10/08/2023   BUN 14 10/08/2023   CREATININE 1.09 10/08/2023   CALCIUM 8.7 (L) 10/08/2023   MG 1.9 02/23/2023   PHOS 3.3 02/23/2023    Lab Results  Component Value Date   INR 1.1 02/15/2023   APTT 29 02/27/2022     Urine Culture: @[id number] (laburin,org,[id number])@   IMAGING: CT ABDOMEN PELVIS W CONTRAST Result Date: 10/08/2023 CLINICAL DATA:  Gross hematuria.  History of prostate cancer. EXAM: CT ABDOMEN AND PELVIS WITH CONTRAST TECHNIQUE: Multidetector CT imaging of the abdomen and pelvis was performed using the standard protocol following bolus administration of intravenous contrast. RADIATION DOSE REDUCTION: This exam was performed according to the departmental dose-optimization program which includes automated exposure control, adjustment of the mA and/or kV according to patient size and/or use of iterative reconstruction technique. CONTRAST:  OMNIPAQUE IOHEXOL 300 MG/ML  SOLN COMPARISON:  None Available. FINDINGS: Lower chest: No acute abnormality. Hepatobiliary: No focal liver abnormality is seen. No gallstones, gallbladder wall thickening, or biliary dilatation. Pancreas: Unremarkable. No pancreatic ductal dilatation or surrounding inflammatory changes. Spleen: Normal in size without focal abnormality. Adrenals/Urinary Tract: Adrenal glands appear normal. Bilateral renal cysts are noted for which no further follow-up is required. No hydronephrosis or renal obstruction is noted. Suprapubic catheter is noted in urinary bladder. Enlarged prostate is seen extending into urinary bladder consistent with history of prostate cancer. Stomach/Bowel: Stomach is within normal limits. Appendix appears normal. No evidence of bowel wall thickening, distention, or  inflammatory changes. Vascular/Lymphatic: Aortic atherosclerosis. No enlarged abdominal or pelvic lymph nodes. IVC filter is noted in infrarenal position. Reproductive: As noted above, enlarged prostate gland is noted with extension into inferior portion of urinary bladder consistent with history of prostatic carcinoma. Other: No ascites or hernia is noted. Musculoskeletal: No acute or significant osseous findings. IMPRESSION: Enlarged prostate gland is noted which extends into urinary bladder consistent with  history of prostate cancer. Suprapubic bladder catheter is noted. Aortic Atherosclerosis (ICD10-I70.0). Electronically Signed   By: Lupita Raider M.D.   On: 10/08/2023 09:47    ------  Elmon Kirschner, NP Pager: [phone]   Please contact the urology consult pager with any further questions/concerns.

## 2023-10-08 NOTE — ED Provider Notes (Signed)
 [place] [facility] AT [facility] Provider Note  CSN: [id number] Arrival date & time: 10/08/23 0301  Chief Complaint(s) Hematuria  HPI Nathan [name] is a 77 y.o. male h/o prostate cancer in remission now with suprapubic foley catheter, here for low Hb after 2 weeks of gross hematuria. Hb check yesterday and reported to be 3 g lower. Patient denies abdominal pain/flank pain. No bleeding from site. No redness. Patient denies lightheadedness, CP or SOB. He is no longer on AC.   HPI  Past Medical History Past Medical History:  Diagnosis Date   Anginal pain (HCC)    Atrial fibrillation (HCC) 01/2020   resolved now    Cancer of prostate (HCC) 2020   Chronic anticoagulation    Diabetes mellitus without complication (HCC)    type 2 diet controlled   Dysrhythmia    a fib   Foley catheter in place    will be changed 05-16-20   HTN (hypertension)    Lipoma of chest wall    Pneumonia 01/2020   and uti in hospital for 1 week   WPW (Wolff-Parkinson-White syndrome)    No prior ablation   Patient Active Problem List   Diagnosis Date Noted   Acute blood loss anemia 02/15/2023   Chronic deep vein thrombosis (DVT) (HCC) 09/17/2022   ABLA (acute blood loss anemia) 02/27/2022   Fever 01/24/2022   Acute blood loss anemia (ABLA) 01/24/2022   History of DVT (deep vein thrombosis) 01/12/2022   Gross hematuria 01/12/2022   Symptomatic anemia 01/11/2022   Acute DVT (deep venous thrombosis) (HCC) 12/09/2021   UTI (urinary tract infection) 12/09/2021   Essential hypertension 12/09/2021   Type 2 diabetes mellitus without complication, without long-term current use of insulin (HCC) 12/09/2021   Normocytic anemia 12/09/2021   Malignant neoplasm of prostate (HCC) 07/07/2019   Paroxysmal atrial fibrillation (HCC) 06/05/2019   Home Medication(s) Prior to Admission medications   Medication Sig Start Date End Date Taking? Authorizing Provider  albuterol (VENTOLIN HFA) 108  (90 Base) MCG/ACT inhaler Inhale into the lungs. 10/01/23  Yes [provider]  ergocalciferol (VITAMIN D2) 1.25 MG (50000 UT) capsule Take 50,000 Units by mouth once a week. Patient usually takes this Sundays   Yes [provider]  finasteride (PROSCAR) 5 MG tablet Take 5 mg by mouth daily.   Yes [provider]  folic acid (FOLVITE) 1 MG tablet Take 1 mg by mouth daily. 03/24/19  Yes [provider]  gabapentin (NEURONTIN) 300 MG capsule Take 300 mg by mouth 3 (three) times daily. 10/27/21  Yes [provider]  magnesium oxide (MAG-OX) 400 MG tablet Take 400 mg by mouth daily.  03/24/19  Yes [provider]  OVER THE COUNTER MEDICATION Take 1 capsule by mouth daily as needed (Siatic Nerve pain). Sciatiease   Yes [provider]  OVER THE COUNTER MEDICATION Take 1 capsule by mouth daily. Methylate 1 daily   Yes [provider]  Prenatal Vit-Fe Fumarate-FA (PRENATAL MULTIVITAMIN) TABS tablet Take 1 tablet by mouth daily at 12 noon.   Yes [provider]  spironolactone (ALDACTONE) 25 MG tablet Take 25 mg by mouth daily. 10/01/23  Yes [provider]  ferrous sulfate 325 (65 FE) MG tablet Take 1 tablet (325 mg total) by mouth every other day. 02/23/23 04/24/23  Zigmund Daniel., MD  Omega-3 Fatty Acids (FISH OIL) 1000 MG CAPS Take 1,000 mg by mouth daily. Patient not taking: Reported on 10/08/2023  [provider]                                                                                                                                    Allergies Patient has no known allergies.  Review of Systems Review of Systems As noted in HPI  Physical Exam Vital Signs  I have reviewed the triage vital signs BP (!) 156/79 (BP Location: Right Arm)   Pulse 85   Temp 97.8 F (36.6 C) (Oral)   Resp 17   Ht 6\' 1"  (1.854 m)   Wt 77.2 kg   SpO2 98%   BMI 22.45 kg/m   Physical Exam Vitals reviewed.   Constitutional:      General: He is not in acute distress.    Appearance: He is well-developed. He is not diaphoretic.  HENT:     Head: Normocephalic and atraumatic.     Right Ear: External ear normal.     Left Ear: External ear normal.     Nose: Nose normal.     Mouth/Throat:     Mouth: Mucous membranes are moist.  Eyes:     General: No scleral icterus.    Conjunctiva/sclera: Conjunctivae normal.  Neck:     Trachea: Phonation normal.  Cardiovascular:     Rate and Rhythm: Normal rate and regular rhythm.  Pulmonary:     Effort: Pulmonary effort is normal. No respiratory distress.     Breath sounds: No stridor.  Abdominal:     General: There is no distension.    Musculoskeletal:        General: Normal range of motion.     Cervical back: Normal range of motion.  Neurological:     Mental Status: He is alert and oriented to person, place, and time.  Psychiatric:        Behavior: Behavior normal.     ED Results and Treatments Labs (all labs ordered are listed, but only abnormal results are displayed) Labs Reviewed  URINALYSIS, ROUTINE W REFLEX MICROSCOPIC - Abnormal; Notable for the following components:      Result Value   Color, Urine RED (*)    APPearance CLOUDY (*)    Glucose, UA   (*)    Value: TEST NOT REPORTED DUE TO COLOR INTERFERENCE OF URINE PIGMENT   Hgb urine dipstick   (*)    Value: TEST NOT REPORTED DUE TO COLOR INTERFERENCE OF URINE PIGMENT   Bilirubin Urine   (*)    Value: TEST NOT REPORTED DUE TO COLOR INTERFERENCE OF URINE PIGMENT   Ketones, ur   (*)    Value: TEST NOT REPORTED DUE TO COLOR INTERFERENCE OF URINE PIGMENT   Protein, ur   (*)    Value: TEST NOT REPORTED DUE TO COLOR INTERFERENCE OF URINE PIGMENT   Nitrite   (*)    Value: TEST NOT REPORTED DUE TO COLOR INTERFERENCE OF URINE PIGMENT  Leukocytes,Ua   (*)    Value: TEST NOT REPORTED DUE TO COLOR INTERFERENCE OF URINE PIGMENT   Bacteria, UA FEW (*)    All other components within normal  limits  CBC - Abnormal; Notable for the following components:   RBC 2.59 (*)    Hemoglobin 7.5 (*)    HCT 25.1 (*)    MCHC 29.9 (*)    RDW 16.9 (*)    All other components within normal limits  BASIC METABOLIC PANEL WITH GFR - Abnormal; Notable for the following components:   Glucose, Bld 112 (*)    Calcium 8.7 (*)    All other components within normal limits  TYPE AND SCREEN                                                                                                                         EKG  EKG Interpretation Date/Time:    Ventricular Rate:    PR Interval:    QRS Duration:    QT Interval:    QTC Calculation:   R Axis:      Text Interpretation:         Radiology No results found.  Medications Ordered in ED Medications - No data to display Procedures Procedures  (including critical care time) Medical Decision Making / ED Course   Medical Decision Making Amount and/or Complexity of Data Reviewed Labs: ordered. Decision-making details documented in ED Course. Radiology: ordered. Decision-making details documented in ED Course.  Risk Prescription drug management. Decision regarding hospitalization.    Patient presents with acute blood loss anemia.  Hemoglobin down at 7.5 on our CBC.  Patient's daughter called the PCP who confirmed hemoglobin at the clinic 2 weeks ago was 12.9.  Patient had an admission last year for similar presentation and noted to have prostate bleeding requiring embolization.  Will order CT scan.  Consulting urology.  Clinical Course as of 10/08/23 0835  Tue Oct 08, 2023  0655 Spoke with Dr. Coralee Pesa who will arrange evaluation in the ER for recs [PC]    Clinical Course User Index [PC] Keaghan Bowens, Amadeo Garnet, MD   Patient care turned over to oncoming provider. Patient case and results discussed in detail; please see their note for further ED managment.    Final Clinical Impression(s) / ED Diagnoses Final diagnoses:  None     This chart was dictated using voice recognition software.  Despite best efforts to proofread,  errors can occur which can change the documentation meaning.    Nira Conn, MD 10/08/23 [phone]

## 2023-10-08 NOTE — ED Provider Notes (Signed)
 Blood pressure (!) 145/73, pulse 80, temperature 98 F (36.7 C), temperature source Oral, resp. rate 15, height 6\' 1"  (1.854 m), weight 77.2 kg, SpO2 99%.  Assuming care from Dr. Eudelia Bunch.  In short, Nathan [name] is a 78 y.o. male with a chief complaint of Hematuria .  Refer to the original H&P for additional details.  The current plan of care is to follow with Urology after CT.  Discussed case with Urology. Plan for obs admit. Urology to follow.   Discussed patient's case with [facility] to request admission. Patient and family (if present) updated with plan.   I reviewed all nursing notes, vitals, pertinent old records, EKGs, labs, imaging (as available).     Maia Plan, MD 10/08/23 1255

## 2023-10-08 NOTE — H&P (Signed)
 History and Physical    Jennifer Holland BMW:[id number] DOB: 06-28-1946 DOA: 10/08/2023  PCP: Dois Davenport, MD   Patient coming from:Home lives with her daughter Chief Complaint  Patient presents with   Hematuria   78 y.o. male with a history of prostate cancer-S/P XRT in 2021 , hypertension, diabetes, history of A-fib, DVT no longer on anticoagulation, chronic SP catheter, history of right iliac stent last admission 02/23/23 for UTI with catheter, hematuria/ABLA needing 3 units PRBC and coil embolization right prostatic artery.  History of CAD with drop in hemoglobin 3.8 and hematuria for at least 2 weeks.  Foley catheter changed by urology on 4/7 and blood work obtained. In the ED-hemodynamically stable afebrile.  Labs hemoglobin down to 7.5g b/l ~7.9 on 02/23/2023,UA WBC >50 RBC >50 bacteria few CT abdomen pelvis with contrast>> enlarged prostate extending into the urinary bladder consistent with prostate cancer SP catheter in place aortic atherosclerosis. Urology consulted and admission requested under [facility]. Patient otherwise denies any nausea, vomiting, chest pain, shortness of breath, fever, chills, headache, focal weakness, numbness tingling, speech difficulties   Assessment and plan:  Prostate Cancer s/p XRT Hx if Prostatic artery embolization Hematuria ABLA on chronic anemia: CT Abd- shows significant encroachment of the prostate into the bladder, similar to imaging last year. Having intermittent clot in SP catheter, irrigatated in ED, 1 unit prbc ordered in ED and about ot be given. Monitor hh and observation,?IR eval if rebleeding. Cont emperic antibiotics. Iv hydration.  Continue finasteride and iron supplementation.  Hypertension: BP stable. hold Aldactone seem not taking it.  T2DM: Blood sugar controlled.  Add sliding scale insulin.  history of A-fib DVT history of right iliac stent: no longer on anticoagulation   Body mass index is 22.45 kg/m.   Severity of  Illness: The appropriate patient status for this patient is OBSERVATION. Observation status is judged to be reasonable and necessary in order to provide the required intensity of service to ensure the patient's safety. The patient's presenting symptoms, physical exam findings, and initial radiographic and laboratory data in the context of their medical condition is felt to place them at decreased risk for further clinical deterioration. Furthermore, it is anticipated that the patient will be medically stable for discharge from the hospital within 2 midnights of admission.    DVT prophylaxis: SCDs Start: 10/08/23 1153 Code Status:   Code Status: Full Code  Family Communication: Admission, patients condition and plan of care including tests being ordered have been discussed with the patient and his daughter who indicate understanding and agree with the plan and Code Status.  Consults called:    Review of Systems: All systems were reviewed and were negative except as mentioned in HPI above. Negative for fever Negative for chest pain Negative for shortness of breath  Past Medical History:  Diagnosis Date   Anginal pain (HCC)    Atrial fibrillation (HCC) 01/2020   resolved now    Cancer of prostate (HCC) 2020   Chronic anticoagulation    Diabetes mellitus without complication (HCC)    type 2 diet controlled   Dysrhythmia    a fib   Foley catheter in place    will be changed 05-16-20   HTN (hypertension)    Lipoma of chest wall    Pneumonia 01/2020   and uti in hospital for 1 week   WPW (Wolff-Parkinson-White syndrome)    No prior ablation    Past Surgical History:  Procedure Laterality Date   CATARACT EXTRACTION Bilateral  2020   CYSTOSCOPY N/A 06/05/2021   Procedure: CYSTOSCOPY WITH SUPRA PUBIC TUBE PLACEMENT;  Surgeon: Heloise Purpura, MD;  Location: [facility];  Service: Urology;  Laterality: N/A;   CYSTOSCOPY WITH FULGERATION N/A 01/27/2022   Procedure: CYSTOSCOPY WITH FULGERATION  WITH CLOT EVACUATION;  Surgeon: Crist Fat, MD;  Location: [facility];  Service: Urology;  Laterality: N/A;   CYSTOSCOPY WITH FULGERATION N/A 03/02/2022   Procedure: CYSTOSCOPY, CLOT EVACUTION WITH TRANSURETHERAL FULGERATION OF BLADDER;  Surgeon: Heloise Purpura, MD;  Location: [facility];  Service: Urology;  Laterality: N/A;   IR 3D INDEPENDENT WKST  01/17/2022   IR ANGIOGRAM PELVIS SELECTIVE OR SUPRASELECTIVE  01/17/2022   IR ANGIOGRAM PELVIS SELECTIVE OR SUPRASELECTIVE  02/16/2023   IR ANGIOGRAM PELVIS SELECTIVE OR SUPRASELECTIVE  02/16/2023   IR ANGIOGRAM SELECTIVE EACH ADDITIONAL VESSEL  01/17/2022   IR ANGIOGRAM SELECTIVE EACH ADDITIONAL VESSEL  01/17/2022   IR EMBO ART  VEN HEMORR LYMPH EXTRAV  INC GUIDE ROADMAPPING  01/17/2022   IR EMBO ARTERIAL NOT HEMORR HEMANG INC GUIDE ROADMAPPING  02/16/2023   IR US GUIDE VASC ACCESS RIGHT  01/17/2022   IR US GUIDE VASC ACCESS RIGHT  02/16/2023   LIPOMA EXCISION Left 05/16/2020   Procedure: EXCISION LIPOMA LEFT CHEST WALL;  Surgeon: Sheliah Hatch De Blanch, MD;  Location: [facility];  Service: General;  Laterality: Left;   LOWER EXTREMITY VENOGRAPHY Right 09/17/2022   Procedure: LOWER EXTREMITY VENOGRAPHY;  Surgeon: Maeola Harman, MD;  Location: [facility] INVASIVE CV LAB;  Service: Cardiovascular;  Laterality: Right;   PERIPHERAL VASCULAR INTERVENTION  09/17/2022   Procedure: PERIPHERAL VASCULAR INTERVENTION;  Surgeon: Maeola Harman, MD;  Location: [facility] INVASIVE CV LAB;  Service: Cardiovascular;;   RADIOACTIVE SEED IMPLANT  2020   TRANSURETHRAL RESECTION OF BLADDER TUMOR N/A 01/17/2022   Procedure: CYSTOSCOPY WITH CLOT EVACUATION, FULGERATION OF PROSTATE;  Surgeon: Jerilee Field, MD;  Location: [facility];  Service: Urology;  Laterality: N/A;   VENA CAVA FILTER PLACEMENT Right 12/10/2021   Procedure: INSERTION VENA-CAVA FILTER Right Femoral;  Surgeon: Maeola Harman, MD;  Location[facility];  Service: Vascular;  Laterality:  Right;     reports that he quit smoking about 4 years ago. His smoking use included cigarettes. He started smoking about 64 years ago. He has a 30 pack-year smoking history. He has never used smokeless tobacco. He reports that he does not currently use alcohol. He reports that he does not use drugs.  No Known Allergies  Family History  Problem Relation Age of Onset   Lung cancer Mother    Cervical cancer Sister    Breast cancer Neg Hx    Pancreatic cancer Neg Hx    Colon cancer Neg Hx      Prior to Admission medications   Medication Sig Start Date End Date Taking? Authorizing Provider  albuterol (VENTOLIN HFA) 108 (90 Base) MCG/ACT inhaler Inhale into the lungs. 10/01/23  Yes [provider]  ergocalciferol (VITAMIN D2) 1.25 MG (50000 UT) capsule Take 50,000 Units by mouth once a week. Patient usually takes this Sundays   Yes [provider]  finasteride (PROSCAR) 5 MG tablet Take 5 mg by mouth daily.   Yes [provider]  folic acid (FOLVITE) 1 MG tablet Take 1 mg by mouth daily. 03/24/19  Yes [provider]  gabapentin (NEURONTIN) 300 MG capsule Take 300 mg by mouth 3 (three) times daily. 10/27/21  Yes [provider]  magnesium oxide (MAG-OX) 400 MG tablet  Take 400 mg by mouth daily.  03/24/19  Yes [provider]  OVER THE COUNTER MEDICATION Take 1 capsule by mouth daily as needed (Siatic Nerve pain). Sciatiease   Yes [provider]  OVER THE COUNTER MEDICATION Take 1 capsule by mouth daily. Methylate 1 daily   Yes [provider]  Prenatal Vit-Fe Fumarate-FA (PRENATAL MULTIVITAMIN) TABS tablet Take 1 tablet by mouth daily at 12 noon.   Yes [provider]  spironolactone (ALDACTONE) 25 MG tablet Take 25 mg by mouth daily. 10/01/23  Yes [provider]  ferrous sulfate 325 (65 FE) MG tablet Take 1 tablet (325 mg total) by mouth every other day. 02/23/23 04/24/23  Zigmund Daniel., MD     Physical Exam: Vitals:   10/08/23 0320 10/08/23 0650 10/08/23 0730 10/08/23 1108  BP:  (!) 145/73 131/69   Pulse:  80 73   Resp:  15 20   Temp:  98 F (36.7 C)  97.9 F (36.6 C)  TempSrc:  Oral  Oral  SpO2:  99% 99%   Weight: 77.2 kg     Height: 6\' 1"  (1.854 m)       General exam: AAOx3 , NAD, weak appearing. HEENT:Oral mucosa moist, Ear/Nose WNL grossly, dentition normal. Respiratory system: bilaterally clear,no wheezing or crackles,no use of accessory muscle Cardiovascular system: S1 & S2 +, No JVD,. Gastrointestinal system: Abdomen soft, NT,ND, BS+ Nervous System:Alert, awake, moving extremities and grossly nonfocal Extremities: No edema, distal peripheral pulses palpable.  Skin: No rashes,no icterus. MSK: Normal muscle bulk,tone, power  Sp catheter+ foley bag w/ hematuria  Labs on Admission: I have personally reviewed following labs and imaging studies  CBC: Recent Labs  Lab 10/08/23 0322  WBC 6.1  HGB 7.5*  HCT 25.1*  MCV 96.9  PLT 305   Basic Metabolic Panel: Recent Labs  Lab 10/08/23 0322  NA 138  K 4.0  CL 107  CO2 24  GLUCOSE 112*  BUN 14  CREATININE 1.09  CALCIUM 8.7*  No results for input(s): "TSH", "T4TOTAL", "FREET4", "T3FREE", "THYROIDAB" in the last 72 hours. Urine analysis:    Component Value Date/Time   COLORURINE RED (A) 10/08/2023 0322   APPEARANCEUR CLOUDY (A) 10/08/2023 0322   LABSPEC  10/08/2023 0322    TEST NOT REPORTED DUE TO COLOR INTERFERENCE OF URINE PIGMENT   PHURINE  10/08/2023 0322    TEST NOT REPORTED DUE TO COLOR INTERFERENCE OF URINE PIGMENT   GLUCOSEU (A) 10/08/2023 0322    TEST NOT REPORTED DUE TO COLOR INTERFERENCE OF URINE PIGMENT   HGBUR (A) 10/08/2023 0322    TEST NOT REPORTED DUE TO COLOR INTERFERENCE OF URINE PIGMENT   BILIRUBINUR (A) 10/08/2023 0322    TEST NOT REPORTED DUE TO COLOR INTERFERENCE OF URINE PIGMENT   KETONESUR (A) 10/08/2023 0322    TEST NOT REPORTED DUE TO COLOR INTERFERENCE OF URINE  PIGMENT   PROTEINUR (A) 10/08/2023 0322    TEST NOT REPORTED DUE TO COLOR INTERFERENCE OF URINE PIGMENT   NITRITE (A) 10/08/2023 0322    TEST NOT REPORTED DUE TO COLOR INTERFERENCE OF URINE PIGMENT   LEUKOCYTESUR (A) 10/08/2023 0322    TEST NOT REPORTED DUE TO COLOR INTERFERENCE OF URINE PIGMENT    Radiological Exams on Admission: CT ABDOMEN PELVIS W CONTRAST Result Date: 10/08/2023 CLINICAL DATA:  Gross hematuria.  History of prostate cancer. EXAM: CT ABDOMEN AND PELVIS WITH CONTRAST TECHNIQUE: Multidetector CT imaging of the abdomen and pelvis was performed using the  standard protocol following bolus administration of intravenous contrast. RADIATION DOSE REDUCTION: This exam was performed according to the departmental dose-optimization program which includes automated exposure control, adjustment of the mA and/or kV according to patient size and/or use of iterative reconstruction technique. CONTRAST:  OMNIPAQUE IOHEXOL 300 MG/ML  SOLN COMPARISON:  None Available. FINDINGS: Lower chest: No acute abnormality. Hepatobiliary: No focal liver abnormality is seen. No gallstones, gallbladder wall thickening, or biliary dilatation. Pancreas: Unremarkable. No pancreatic ductal dilatation or surrounding inflammatory changes. Spleen: Normal in size without focal abnormality. Adrenals/Urinary Tract: Adrenal glands appear normal. Bilateral renal cysts are noted for which no further follow-up is required. No hydronephrosis or renal obstruction is noted. Suprapubic catheter is noted in urinary bladder. Enlarged prostate is seen extending into urinary bladder consistent with history of prostate cancer. Stomach/Bowel: Stomach is within normal limits. Appendix appears normal. No evidence of bowel wall thickening, distention, or inflammatory changes. Vascular/Lymphatic: Aortic atherosclerosis. No enlarged abdominal or pelvic lymph nodes. IVC filter is noted in infrarenal position. Reproductive: As noted above,  enlarged prostate gland is noted with extension into inferior portion of urinary bladder consistent with history of prostatic carcinoma. Other: No ascites or hernia is noted. Musculoskeletal: No acute or significant osseous findings. IMPRESSION: Enlarged prostate gland is noted which extends into urinary bladder consistent with history of prostate cancer. Suprapubic bladder catheter is noted. Aortic Atherosclerosis (ICD10-I70.0). Electronically Signed   By: Lupita Raider M.D.   On: 10/08/2023 09:47    Lanae Boast MD [facility]  If 7PM-7AM, please contact night-coverage [email or web address]  10/08/2023, 11:53 AM

## 2023-10-08 NOTE — ED Notes (Signed)
 ED TO INPATIENT HANDOFF REPORT  Name/Age/Gender Nathan [name] 78 y.o. male  Code Status    Code Status Orders  (From admission, onward)           Start     Ordered   10/08/23 1153  Full code  Continuous       Question:  By:  Answer:  Consent: discussion documented in EHR   10/08/23 1152           Code Status History     Date Active Date Inactive Code Status Order ID Comments User Context   02/15/2023 1238 02/23/2023 2037 Full Code [id number]  Maryln Gottron, MD ED   09/17/2022 1540 09/18/2022 1714 Full Code [id number]  Maeola Harman, MD Inpatient   02/27/2022 1602 03/03/2022 1527 Full Code [id number]  [name], MD ED   02/13/2022 0625 02/16/2022 1949 Full Code [id number]  Marinda Elk, MD ED   01/12/2022 0049 01/28/2022 2225 Full Code [id number]  Anselm Jungling, DO ED   12/09/2021 1710 12/11/2021 2253 Full Code [id number]  Teddy Spike, DO Inpatient   02/22/2020 1822 02/28/2020 1641 Full Code [id number]  Cheri Fowler, MD ED       Home/SNF/Other Home  Chief Complaint Hematuria [R31.9]  Level of Care/Admitting Diagnosis ED Disposition     ED Disposition  Admit   Condition  --   Comment  Hospital Area: [facility] [[id number]]  Level of Care: Telemetry [5]  Admit to tele based on following criteria: Complex arrhythmia (Bradycardia/Tachycardia)  May place patient in observation at [facility] or [name] if equivalent level of care is available:: No  Covid Evaluation: Asymptomatic - no recent exposure (last 10 days) testing not required  Diagnosis: Hematuria [741758]  Admitting Physician: Lanae Boast [[id number]]  Attending Physician: Lanae Boast [[id number]]          Medical History Past Medical History:  Diagnosis Date   Anginal pain (HCC)    Atrial fibrillation (HCC) 01/2020   resolved now    Cancer of prostate (HCC) 2020   Chronic anticoagulation    Diabetes mellitus without complication (HCC)    type 2 diet controlled    Dysrhythmia    a fib   Foley catheter in place    will be changed 05-16-20   HTN (hypertension)    Lipoma of chest wall    Pneumonia 01/2020   and uti in hospital for 1 week   WPW (Wolff-Parkinson-White syndrome)    No prior ablation    Allergies No Known Allergies  IV Location/Drains/Wounds Patient Lines/Drains/Airways Status     Active Line/Drains/Airways     Name Placement date Placement time Site Days   Peripheral IV 10/08/23 18 G Anterior;Left Forearm 10/08/23  0356  Forearm  less than 1   Peripheral IV 10/08/23 22 G 1" Anterior;Right Forearm 10/08/23  1603  Forearm  less than 1   Suprapubic Catheter Latex 16 Fr. 01/17/22  0840  Latex  629            Labs/Imaging Results for orders placed or performed during the hospital encounter of 10/08/23 (from the past 48 hours)  Urinalysis, Routine w reflex microscopic -Urine, Catheterized     Status: Abnormal   Collection Time: 10/08/23  3:22 AM  Result Value Ref Range   Color, Urine RED (A) YELLOW   APPearance CLOUDY (A) CLEAR   Specific Gravity, Urine  1.005 - 1.030    TEST NOT REPORTED DUE TO COLOR INTERFERENCE  OF URINE PIGMENT   pH  5.0 - 8.0    TEST NOT REPORTED DUE TO COLOR INTERFERENCE OF URINE PIGMENT   Glucose, UA (A) NEGATIVE mg/dL    TEST NOT REPORTED DUE TO COLOR INTERFERENCE OF URINE PIGMENT   Hgb urine dipstick (A) NEGATIVE    TEST NOT REPORTED DUE TO COLOR INTERFERENCE OF URINE PIGMENT   Bilirubin Urine (A) NEGATIVE    TEST NOT REPORTED DUE TO COLOR INTERFERENCE OF URINE PIGMENT   Ketones, ur (A) NEGATIVE mg/dL    TEST NOT REPORTED DUE TO COLOR INTERFERENCE OF URINE PIGMENT   Protein, ur (A) NEGATIVE mg/dL    TEST NOT REPORTED DUE TO COLOR INTERFERENCE OF URINE PIGMENT   Nitrite (A) NEGATIVE    TEST NOT REPORTED DUE TO COLOR INTERFERENCE OF URINE PIGMENT   Leukocytes,Ua (A) NEGATIVE    TEST NOT REPORTED DUE TO COLOR INTERFERENCE OF URINE PIGMENT   RBC / HPF >50 0 - 5 RBC/hpf   WBC, UA >50 0 - 5  WBC/hpf   Bacteria, UA FEW (A) NONE SEEN   Squamous Epithelial / HPF 0-5 0 - 5 /HPF   WBC Clumps PRESENT     Comment: Performed at [facility], [address], [place], [place] [address]  CBC     Status: Abnormal   Collection Time: 10/08/23  3:22 AM  Result Value Ref Range   WBC 6.1 4.0 - 10.5 K/uL   RBC 2.59 (L) 4.22 - 5.81 MIL/uL   Hemoglobin 7.5 (L) 13.0 - 17.0 g/dL   HCT 13.2 (L) 44.0 - 10.2 %   MCV 96.9 80.0 - 100.0 fL   MCH 29.0 26.0 - 34.0 pg   MCHC 29.9 (L) 30.0 - 36.0 g/dL   RDW 72.5 (H) 36.6 - 44.0 %   Platelets 305 150 - 400 K/uL   nRBC 0.0 0.0 - 0.2 %    Comment: Performed at [facility], [address], [place], [place] [address]  Basic metabolic panel     Status: Abnormal   Collection Time: 10/08/23  3:22 AM  Result Value Ref Range   Sodium 138 135 - 145 mmol/L   Potassium 4.0 3.5 - 5.1 mmol/L   Chloride 107 98 - 111 mmol/L   CO2 24 22 - 32 mmol/L   Glucose, Bld 112 (H) 70 - 99 mg/dL    Comment: Glucose reference range applies only to samples taken after fasting for at least 8 hours.   BUN 14 8 - 23 mg/dL   Creatinine, Ser 5.95 0.61 - 1.24 mg/dL   Calcium 8.7 (L) 8.9 - 10.3 mg/dL   GFR, Estimated >63 >87 mL/min    Comment: (NOTE) Calculated using the CKD-EPI Creatinine Equation (2021)    Anion gap 7 5 - 15    Comment: Performed at [facility], [address] [place], [place] [address]  Type and screen [facility]     Status: None (Preliminary result)   Collection Time: 10/08/23  3:55 AM  Result Value Ref Range   ABO/RH(D) B POS    Antibody Screen NEG    Sample Expiration 10/11/2023,2359    Unit Number [id number]    Blood Component Type RED CELLS,LR    Unit division 00    Status of Unit ISSUED    Transfusion Status OK TO TRANSFUSE    Crossmatch Result      Compatible Performed at [facility], [address]  [place], [place] [address]   Prepare RBC      Status: None   Collection Time: 10/08/23  6:33 AM  Result Value Ref Range   Order Confirmation      ORDER PROCESSED BY BLOOD BANK Performed at [facility], [address], [place], [place] [address]   CBG monitoring, ED     Status: None   Collection Time: 10/08/23 12:52 PM  Result Value Ref Range   Glucose-Capillary 83 70 - 99 mg/dL    Comment: Glucose reference range applies only to samples taken after fasting for at least 8 hours.  CBG monitoring, ED     Status: None   Collection Time: 10/08/23  4:54 PM  Result Value Ref Range   Glucose-Capillary 82 70 - 99 mg/dL    Comment: Glucose reference range applies only to samples taken after fasting for at least 8 hours.   Comment 1 Notify RN    Comment 2 Document in Chart   CBG monitoring, ED     Status: None   Collection Time: 10/08/23 10:07 PM  Result Value Ref Range   Glucose-Capillary 80 70 - 99 mg/dL    Comment: Glucose reference range applies only to samples taken after fasting for at least 8 hours.   CT ABDOMEN PELVIS W CONTRAST Result Date: 10/08/2023 CLINICAL DATA:  Gross hematuria.  History of prostate cancer. EXAM: CT ABDOMEN AND PELVIS WITH CONTRAST TECHNIQUE: Multidetector CT imaging of the abdomen and pelvis was performed using the standard protocol following bolus administration of intravenous contrast. RADIATION DOSE REDUCTION: This exam was performed according to the departmental dose-optimization program which includes automated exposure control, adjustment of the mA and/or kV according to patient size and/or use of iterative reconstruction technique. CONTRAST:  OMNIPAQUE IOHEXOL 300 MG/ML  SOLN COMPARISON:  None Available. FINDINGS: Lower chest: No acute abnormality. Hepatobiliary: No focal liver abnormality is seen. No gallstones, gallbladder wall thickening, or biliary dilatation. Pancreas: Unremarkable. No pancreatic ductal dilatation or surrounding inflammatory changes. Spleen: Normal in size  without focal abnormality. Adrenals/Urinary Tract: Adrenal glands appear normal. Bilateral renal cysts are noted for which no further follow-up is required. No hydronephrosis or renal obstruction is noted. Suprapubic catheter is noted in urinary bladder. Enlarged prostate is seen extending into urinary bladder consistent with history of prostate cancer. Stomach/Bowel: Stomach is within normal limits. Appendix appears normal. No evidence of bowel wall thickening, distention, or inflammatory changes. Vascular/Lymphatic: Aortic atherosclerosis. No enlarged abdominal or pelvic lymph nodes. IVC filter is noted in infrarenal position. Reproductive: As noted above, enlarged prostate gland is noted with extension into inferior portion of urinary bladder consistent with history of prostatic carcinoma. Other: No ascites or hernia is noted. Musculoskeletal: No acute or significant osseous findings. IMPRESSION: Enlarged prostate gland is noted which extends into urinary bladder consistent with history of prostate cancer. Suprapubic bladder catheter is noted. Aortic Atherosclerosis (ICD10-I70.0). Electronically Signed   By: Lupita Raider M.D.   On: 10/08/2023 09:47    Pending Labs Unresulted Labs (From admission, onward)     Start     Ordered   10/08/23 2005  Hemoglobin and hematocrit, blood  Once,   R        10/08/23 2005   10/08/23 2005  Hemoglobin A1c  Once,   R        10/08/23 2005   10/08/23 1300  Hemoglobin and hematocrit, blood  Now then every 6 hours,   R (with TIMED occurrences)  10/08/23 1152            Vitals/Pain Today's Vitals   10/08/23 1239 10/08/23 1315 10/08/23 1617 10/08/23 2014  BP: (!) 143/70 (!) 148/77 137/76 (!) 145/73  Pulse: 75 76 78 82  Resp: 18 20 18 17   Temp: 98.1 F (36.7 C)  98 F (36.7 C) 98.1 F (36.7 C)  TempSrc: Oral  Oral   SpO2: 100% 100% 100% 99%  Weight:      Height:      PainSc:        Isolation Precautions No active  isolations  Medications Medications  insulin aspart (novoLOG) injection 0-9 Units ( Subcutaneous Not Given 10/08/23 1700)  acetaminophen (TYLENOL) tablet 650 mg (has no administration in time range)    Or  acetaminophen (TYLENOL) suppository 650 mg (has no administration in time range)  ondansetron (ZOFRAN) tablet 4 mg (has no administration in time range)    Or  ondansetron (ZOFRAN) injection 4 mg (has no administration in time range)  cefTRIAXone (ROCEPHIN) 1 g in sodium chloride 0.9 % 100 mL IVPB (0 g Intravenous Stopped 10/08/23 1656)  lactated ringers infusion ( Intravenous New Bag/Given 10/08/23 1657)  ferrous sulfate tablet 325 mg (has no administration in time range)  albuterol (PROVENTIL) (2.5 MG/3ML) 0.083% nebulizer solution 2.5 mg (has no administration in time range)  magnesium oxide (MAG-OX) tablet 400 mg (400 mg Oral Given 10/08/23 1252)  gabapentin (NEURONTIN) capsule 300 mg (300 mg Oral Given 10/08/23 1647)  folic acid (FOLVITE) tablet 1 mg (1 mg Oral Given 10/08/23 1252)  finasteride (PROSCAR) tablet 5 mg (5 mg Oral Given 10/08/23 1252)  0.9 %  sodium chloride infusion (Manually program via Guardrails IV Fluids) (0 mLs Intravenous Stopped 10/08/23 1640)  iohexol (OMNIPAQUE) 300 MG/ML solution 100 mL (100 mLs Intravenous Contrast Given 10/08/23 0742)  tranexamic acid (CYKLOKAPRON) IVPB 1,000 mg (0 mg Intravenous Stopped 10/08/23 1656)    Mobility walks with person assist

## 2023-10-09 DIAGNOSIS — I700 Atherosclerosis of aorta: Secondary | ICD-10-CM | POA: Diagnosis present

## 2023-10-09 DIAGNOSIS — N400 Enlarged prostate without lower urinary tract symptoms: Secondary | ICD-10-CM | POA: Diagnosis present

## 2023-10-09 DIAGNOSIS — D62 Acute posthemorrhagic anemia: Secondary | ICD-10-CM | POA: Diagnosis present

## 2023-10-09 DIAGNOSIS — Z9841 Cataract extraction status, right eye: Secondary | ICD-10-CM | POA: Diagnosis not present

## 2023-10-09 DIAGNOSIS — N3041 Irradiation cystitis with hematuria: Secondary | ICD-10-CM | POA: Diagnosis present

## 2023-10-09 DIAGNOSIS — I10 Essential (primary) hypertension: Secondary | ICD-10-CM | POA: Diagnosis present

## 2023-10-09 DIAGNOSIS — Z87891 Personal history of nicotine dependence: Secondary | ICD-10-CM | POA: Diagnosis not present

## 2023-10-09 DIAGNOSIS — Z9842 Cataract extraction status, left eye: Secondary | ICD-10-CM | POA: Diagnosis not present

## 2023-10-09 DIAGNOSIS — Z79899 Other long term (current) drug therapy: Secondary | ICD-10-CM | POA: Diagnosis not present

## 2023-10-09 DIAGNOSIS — R310 Gross hematuria: Secondary | ICD-10-CM | POA: Diagnosis not present

## 2023-10-09 DIAGNOSIS — I480 Paroxysmal atrial fibrillation: Secondary | ICD-10-CM | POA: Diagnosis present

## 2023-10-09 DIAGNOSIS — Z7901 Long term (current) use of anticoagulants: Secondary | ICD-10-CM | POA: Diagnosis not present

## 2023-10-09 DIAGNOSIS — I4891 Unspecified atrial fibrillation: Secondary | ICD-10-CM | POA: Diagnosis not present

## 2023-10-09 DIAGNOSIS — Z95820 Peripheral vascular angioplasty status with implants and grafts: Secondary | ICD-10-CM | POA: Diagnosis not present

## 2023-10-09 DIAGNOSIS — R319 Hematuria, unspecified: Secondary | ICD-10-CM | POA: Diagnosis present

## 2023-10-09 DIAGNOSIS — E119 Type 2 diabetes mellitus without complications: Secondary | ICD-10-CM | POA: Diagnosis present

## 2023-10-09 DIAGNOSIS — C61 Malignant neoplasm of prostate: Secondary | ICD-10-CM | POA: Diagnosis present

## 2023-10-09 DIAGNOSIS — I2510 Atherosclerotic heart disease of native coronary artery without angina pectoris: Secondary | ICD-10-CM | POA: Diagnosis present

## 2023-10-09 DIAGNOSIS — Z801 Family history of malignant neoplasm of trachea, bronchus and lung: Secondary | ICD-10-CM | POA: Diagnosis not present

## 2023-10-09 LAB — BPAM RBC
Blood Product Expiration Date: 202505062359
ISSUE DATE / TIME: 202504081156
Unit Type and Rh: 7300

## 2023-10-09 LAB — GLUCOSE, CAPILLARY
Glucose-Capillary: 88 mg/dL (ref 70–99)
Glucose-Capillary: 132 mg/dL — ABNORMAL HIGH (ref 70–99)
Glucose-Capillary: 108 mg/dL — ABNORMAL HIGH (ref 70–99)
Glucose-Capillary: 94 mg/dL (ref 70–99)

## 2023-10-09 LAB — TYPE AND SCREEN
ABO/RH(D): B POS
Antibody Screen: NEGATIVE
Unit division: 0

## 2023-10-09 LAB — HEMOGLOBIN AND HEMATOCRIT, BLOOD
HCT: 26.3 % — ABNORMAL LOW (ref 39.0–52.0)
HCT: 26.9 % — ABNORMAL LOW (ref 39.0–52.0)
HCT: 30.3 % — ABNORMAL LOW (ref 39.0–52.0)
HCT: 29.7 % — ABNORMAL LOW (ref 39.0–52.0)
Hemoglobin: 8.1 g/dL — ABNORMAL LOW (ref 13.0–17.0)
Hemoglobin: 8 g/dL — ABNORMAL LOW (ref 13.0–17.0)
Hemoglobin: 9 g/dL — ABNORMAL LOW (ref 13.0–17.0)
Hemoglobin: 9 g/dL — ABNORMAL LOW (ref 13.0–17.0)

## 2023-10-09 LAB — HEMOGLOBIN A1C
Hgb A1c MFr Bld: 5.3 % (ref 4.8–5.6)
Mean Plasma Glucose: 105 mg/dL

## 2023-10-09 NOTE — Progress Notes (Signed)
 Patient ID: Nathan [name], male   DOB: 01/21/1946, 78 y.o.   MRN: [id number]    Subjective: Pt without complaints this morning.  SP tube drained well overnight.  Objective: Vital signs in last 24 hours: Temp:  [97.6 F (36.4 C)-98.3 F (36.8 C)] 98.3 F (36.8 C) (04/09 0643) Pulse Rate:  [73-87] 87 (04/09 0643) Resp:  [17-20] 19 (04/09 0643) BP: (131-163)/(69-83) 137/83 (04/09 0643) SpO2:  [98 %-100 %] 100 % (04/09 0643) Weight:  [82.2 kg] 82.2 kg (04/08 2246)  Intake/Output from previous day: 04/08 0701 - 04/09 0700 In: 597.8 [I.V.:521.1; IV Piggyback:76.7] Out: 1350 [Urine:1350] Intake/Output this shift: Total I/O In: 597.8 [I.V.:521.1; IV Piggyback:76.7] Out: 1350 [Urine:1350]  Physical Exam:  General: Alert and oriented GU: Urine red tinged but draining well  Lab Results: Recent Labs    10/08/23 0322 10/08/23 2302 10/09/23 0448  HGB 7.5* 9.2* 8.1*  HCT 25.1* 31.2* 26.3*   BMET Recent Labs    10/08/23 0322  NA 138  K 4.0  CL 107  CO2 24  GLUCOSE 112*  BUN 14  CREATININE 1.09  CALCIUM 8.7*     Studies/Results: CT ABDOMEN PELVIS W CONTRAST Result Date: 10/08/2023 CLINICAL DATA:  Gross hematuria.  History of prostate cancer. EXAM: CT ABDOMEN AND PELVIS WITH CONTRAST TECHNIQUE: Multidetector CT imaging of the abdomen and pelvis was performed using the standard protocol following bolus administration of intravenous contrast. RADIATION DOSE REDUCTION: This exam was performed according to the departmental dose-optimization program which includes automated exposure control, adjustment of the mA and/or kV according to patient size and/or use of iterative reconstruction technique. CONTRAST:  OMNIPAQUE IOHEXOL 300 MG/ML  SOLN COMPARISON:  None Available. FINDINGS: Lower chest: No acute abnormality. Hepatobiliary: No focal liver abnormality is seen. No gallstones, gallbladder wall thickening, or biliary dilatation. Pancreas: Unremarkable. No pancreatic ductal  dilatation or surrounding inflammatory changes. Spleen: Normal in size without focal abnormality. Adrenals/Urinary Tract: Adrenal glands appear normal. Bilateral renal cysts are noted for which no further follow-up is required. No hydronephrosis or renal obstruction is noted. Suprapubic catheter is noted in urinary bladder. Enlarged prostate is seen extending into urinary bladder consistent with history of prostate cancer. Stomach/Bowel: Stomach is within normal limits. Appendix appears normal. No evidence of bowel wall thickening, distention, or inflammatory changes. Vascular/Lymphatic: Aortic atherosclerosis. No enlarged abdominal or pelvic lymph nodes. IVC filter is noted in infrarenal position. Reproductive: As noted above, enlarged prostate gland is noted with extension into inferior portion of urinary bladder consistent with history of prostatic carcinoma. Other: No ascites or hernia is noted. Musculoskeletal: No acute or significant osseous findings. IMPRESSION: Enlarged prostate gland is noted which extends into urinary bladder consistent with history of prostate cancer. Suprapubic bladder catheter is noted. Aortic Atherosclerosis (ICD10-I70.0). Electronically Signed   By: Lupita Raider M.D.   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  I irrigated his catheter this morning and removed moderate clot.  Will monitor over next 24 hrs but will tentatively plan to take him to the OR tomorrow for cystoscopy, clot evacuation, and fulguration.  Keep NPO after MN.   LOS: 0 days   Crecencio Mc 10/09/2023, 6:57 AM

## 2023-10-09 NOTE — Plan of Care (Signed)
 Pt has suprapubic catheter in place, draining red urine Ambulated in hallway with walker and assistance Pt educated about NPO order at midnight for surgery tomorrow No pain management   Problem: Education: Goal: Ability to describe self-care measures that may prevent or decrease complications (Diabetes Survival Skills Education) will improve Outcome: Progressing

## 2023-10-09 NOTE — Evaluation (Signed)
 Physical Therapy Evaluation Patient Details Name: Nathan [name] MRN: [id number] DOB: [birth year] Today's Date: 10/09/2023  History of Present Illness  78 y.o. male  admiited with hematuria, anemia. CT abdomen pelvis with contrast>> enlarged prostate extending into the urinary bladder consistent with prostate cancer. PMH:  prostate cancer-S/P XRT in 2021 , hypertension, diabetes, history of A-fib, DVT no longer on anticoagulation, chronic SP catheter, recent admission for right iliac stent 02/23/23 for UTI with catheter, hematuria/ABLA needing 3 units PRBC and coil embolization right prostatic artery.  Clinical Impression  Pt admitted with above diagnosis. Pt ambulated 400' with a RW without loss of balance. In unsupported standing pt was mildly unsteady but had no overt loss of balance. Noted plan for surgery tomorrow, will re-assess following surgery. Pt ambulates with a cane independently at baseline, he denies falls in the past 6 months. Pt currently with functional limitations due to the deficits listed below (see PT Problem List). Pt will benefit from acute skilled PT to increase their independence and safety with mobility to allow discharge.           If plan is discharge home, recommend the following: Assistance with cooking/housework;Assist for transportation;Help with stairs or ramp for entrance   Can travel by private vehicle        Equipment Recommendations None recommended by PT  Recommendations for Other Services       Functional Status Assessment Patient has had a recent decline in their functional status and demonstrates the ability to make significant improvements in function in a reasonable and predictable amount of time.     Precautions / Restrictions Precautions Precautions: Fall Recall of Precautions/Restrictions: Intact Precaution/Restrictions Comments: denies falls in past 6 months Restrictions Weight Bearing Restrictions Per Provider Order: No      Mobility   Bed Mobility Overal bed mobility: Modified Independent             General bed mobility comments: HOB up, used rail    Transfers Overall transfer level: Needs assistance Equipment used: None Transfers: Sit to/from Stand Sit to Stand: Contact guard assist           General transfer comment: CGA for safety, mildly unsteady initially upon standing    Ambulation/Gait Ambulation/Gait assistance: Supervision Gait Distance (Feet): 400 Feet Assistive device: Rolling walker (2 wheels) Gait Pattern/deviations: Step-through pattern, Decreased stride length Gait velocity: WFL     General Gait Details: steady with RW, no loss of balance  Stairs            Wheelchair Mobility     Tilt Bed    Modified Rankin (Stroke Patients Only)       Balance Overall balance assessment: Needs assistance   Sitting balance-Leahy Scale: Normal     Standing balance support: Single extremity supported, During functional activity Standing balance-Leahy Scale: Fair Standing balance comment: mildly unsteady in standing without UE support, but no loss of balance                             Pertinent Vitals/Pain Pain Assessment Pain Assessment: No/denies pain    Home Living Family/patient expects to be discharged to:: Private residence Living Arrangements: Children;Spouse/significant other   Type of Home: House Home Access: Stairs to enter   Secretary/administrator of Steps: 2 Alternate Level Stairs-Number of Steps: flight Home Layout: Two level;Bed/bath upstairs Home Equipment: Cane - single point;Rollator (4 wheels);Grab bars - tub/shower;Grab bars - toilet;Shower seat Additional Comments: lives with  wife at daughter's home, wife available 24/7    Prior Function Prior Level of Function : Independent/Modified Independent             Mobility Comments: Could ambulate used RW or cane depending on the day; only ambulates short community distances; denies  falls ADLs Comments: Reports independent adls and family assist with IADLs; does not drive     Extremity/Trunk Assessment        Lower Extremity Assessment Lower Extremity Assessment: Overall WFL for tasks assessed    Cervical / Trunk Assessment Cervical / Trunk Assessment: Normal  Communication   Communication Communication: No apparent difficulties    Cognition Arousal: Alert Behavior During Therapy: WFL for tasks assessed/performed   PT - Cognitive impairments: No apparent impairments                         Following commands: Intact       Cueing       General Comments      Exercises     Assessment/Plan    PT Assessment Patient needs continued PT services  PT Problem List Decreased balance       PT Treatment Interventions Gait training;DME instruction    PT Goals (Current goals can be found in the Care Plan section)  Acute Rehab PT Goals Patient Stated Goal: bowling PT Goal Formulation: With patient Time For Goal Achievement: 10/23/23 Potential to Achieve Goals: Good    Frequency Min 3X/week     Co-evaluation               AM-PAC PT "6 Clicks" Mobility  Outcome Measure Help needed turning from your back to your side while in a flat bed without using bedrails?: None Help needed moving from lying on your back to sitting on the side of a flat bed without using bedrails?: A Little Help needed moving to and from a bed to a chair (including a wheelchair)?: A Little Help needed standing up from a chair using your arms (e.g., wheelchair or bedside chair)?: None Help needed to walk in hospital room?: A Little Help needed climbing 3-5 steps with a railing? : A Little 6 Click Score: 20    End of Session   Activity Tolerance: Patient tolerated treatment well Patient left: in chair;with call bell/phone within reach Nurse Communication: Mobility status PT Visit Diagnosis: Unsteadiness on feet (R26.81)    Time: 7829-5621 PT Time  Calculation (min) (ACUTE ONLY): 23 min   Charges:   PT Evaluation $PT Eval Low Complexity: 1 Low PT Treatments $Gait Training: 8-22 mins PT General Charges $$ ACUTE PT VISIT: 1 Visit         Tamala Ser PT 10/09/2023  Acute Rehabilitation Services  Office [phone]

## 2023-10-09 NOTE — Progress Notes (Signed)
 PROGRESS NOTE Nathan [name] Comes  ZOX:[id number] DOB: Nov 06, 1945 DOA: 10/08/2023 PCP: Dois Davenport, MD  Brief Narrative/Hospital Course: 78 y.o.m history of prostate cancer-S/P XRT in 2021 , hypertension, diabetes, history of A-fib, DVT no longer on anticoagulation, chronic SP catheter, history of right iliac stent last admission 02/23/23 for UTI with catheter, hematuria/ABLA needing 3 units PRBC and coil embolization right prostatic artery.  History of CAD with drop in hemoglobin 3.8 and hematuria for at least 2 weeks.  Foley catheter changed by urology on 4/7 and blood work obtained. In the ED-hemodynamically stable afebrile.  Labs hemoglobin down to 7.5g b/l ~7.9 on 02/23/2023,UA WBC >50 RBC >50 bacteria few Urology consulted and admission requested under TRH.  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 Catheter irrigated this morning with moderate clot removal, monitor next 24 hours continue n.p.o. after midnight continue pain control Noted tentative plan for OR tomorrow for cystoscopy clot evacuation and fulguration Continue empiric antibiotics, trend h/h  ABLA on chronic anemia: Due to hematuria S/P 1 unit PRBC, trend H&H Recent Labs  Lab 10/08/23 0322 10/08/23 2302 10/09/23 0448 10/09/23 0639  HGB 7.5* 9.2* 8.1* 8.0*  HCT 25.1* 31.2* 26.3* 26.9*     Hypertension: BP stable. hold Aldactone seem not taking it.  T2DM: Blood sugar controlled. Cont ssi Recent Labs  Lab 10/08/23 1252 10/08/23 1654 10/08/23 2207 10/09/23 0741  GLUCAP 83 82 80 88    history of A-fib DVT history of right iliac stent: no longer on anticoagulation    Subjective: Seen and examined, Overnight vitals/labs/events reviewed>    Assessment and Plan:  DVT prophylaxis: SCDs Start: 10/08/23 1153 Code Status:   Code Status: Full Code Family Communication: plan of care discussed with patient at bedside. Patient status is: Remains hospitalized because of severity of illness Level of care: Telemetry   Dispo: The patient is from: home w/ daughter            Anticipated disposition: TBD  Objective: Vitals last 24 hrs: Vitals:   10/08/23 2014 10/08/23 2246 10/09/23 0312 10/09/23 0643  BP: (!) 145/73 (!) 163/78 133/81 137/83  Pulse: 82 86 82 87  Resp: 17 18 19 19   Temp: 98.1 F (36.7 C) 97.6 F (36.4 C) 98.1 F (36.7 C) 98.3 F (36.8 C)  TempSrc:  Oral Oral Oral  SpO2: 99% 100% 98% 100%  Weight:  82.2 kg    Height:  6\' 1"  (1.854 m)     Weight change: 5 kg  Physical Examination: General exam: alert awake, older than stated age HEENT:Oral mucosa moist, Ear/Nose WNL grossly Respiratory system: Bilaterally diminished BS, no use of accessory muscle Cardiovascular system: S1 & S2 +. Gastrointestinal system: Abdomen soft, NT,ND,BS+ Nervous System: Alert, awake,following commands. Extremities: LE edema neg, moving arms legs, warm legs Skin: No rashes,warm. MSK: Normal muscle bulk/tone.  Suprapubic catheter in place w/ pinkish urine  Medications reviewed:  Scheduled Meds:  ferrous sulfate  325 mg Oral QODAY   finasteride  5 mg Oral  Daily   folic acid  1 mg Oral Daily   gabapentin  300 mg Oral TID   insulin aspart  0-9 Units Subcutaneous TID WC   magnesium oxide  400 mg Oral Daily   Continuous Infusions:  cefTRIAXone (ROCEPHIN)  IV Stopped  (10/08/23 1656)   lactated ringers 50 mL/hr at 10/08/23 1657    Diet Order             Diet NPO time specified  Diet effective midnight           Diet clear liquid Room service appropriate? Yes; Fluid consistency: Thin  Diet effective now                   Intake/Output Summary (Last 24 hours) at 10/09/2023 1122 Last data filed at 10/09/2023 0655 Gross per 24 hour  Intake 597.77 ml  Output 1350 ml  Net -752.23 ml   Net IO Since Admission: -752.23 mL [10/09/23 1122]  Wt Readings from Last 3 Encounters:  10/08/23 82.2 kg  02/15/23 77.1 kg  10/17/22 79.4 kg     Unresulted Labs (From admission, onward)     Start     Ordered   10/08/23 2005  Hemoglobin A1c  Once,   R        10/08/23 2005   10/08/23 1300  Hemoglobin and hematocrit, blood  Now then every 6 hours,   R (with TIMED occurrences)      10/08/23 1152          Data Reviewed: I have personally reviewed following labs and imaging studies ( see epic result tab) CBC: Recent Labs  Lab 10/08/23 0322 10/08/23 2302 10/09/23 0448 10/09/23 0639  WBC 6.1  --   --   --   HGB 7.5* 9.2* 8.1* 8.0*  HCT 25.1* 31.2* 26.3* 26.9*  MCV 96.9  --   --   --   PLT 305  --   --   --    CMP: Recent Labs  Lab 10/08/23 0322  NA 138  K 4.0  CL 107  CO2 24  GLUCOSE 112*  BUN 14  CREATININE 1.09  CALCIUM 8.7*  No results for input(s): "HGBA1C" in the last 72 hours. Recent Labs  Lab 10/08/23 1252 10/08/23 1654 10/08/23 2207 10/09/23 0741  GLUCAP 83 82 80 88   No results for input(s): "CHOL", "HDL", "LDLCALC", "TRIG", "CHOLHDL", "LDLDIRECT" in the last 72 hours. No results for input(s): "TSH", "T4TOTAL", "FREET4", "T3FREE", "THYROIDAB" in the last 72 hours. Sepsis Labs:No results for input(s): "PROCALCITON", "LATICACIDVEN" in the last 168 hours. No results found for this or any previous visit (from the past 240 hours).   Antimicrobials/Microbiology: Anti-infectives (From admission, onward)    Start     Dose/Rate  Route Frequency Ordered Stop   10/08/23 1200  cefTRIAXone (ROCEPHIN) 1 g in sodium chloride 0.9 % 100 mL IVPB        1 g 200 mL/hr over 30 Minutes Intravenous Every 24 hours 10/08/23 1152 10/13/23 1159         Component Value Date/Time   SDES  02/15/2023 0919    URINE, RANDOM Performed at [facility], [address] [place], [place] [address]    SPECREQUEST  02/15/2023 0919    NONE Reflexed from [id number] Performed at [facility], [address] [place], [place] [address]    CULT (A) 02/15/2023 0919    >=100,000 COLONIES/mL CITROBACTER KOSERI 50,000 COLONIES/mL PSEUDOMONAS AERUGINOSA 50,000 COLONIES/mL  ESCHERICHIA COLI    REPTSTATUS 02/18/2023 FINAL 02/15/2023 0919     Radiology Studies: CT ABDOMEN PELVIS W CONTRAST Result Date: 10/08/2023 CLINICAL DATA:  Gross hematuria.  History of prostate cancer. EXAM: CT ABDOMEN AND PELVIS WITH CONTRAST TECHNIQUE: Multidetector CT imaging of the abdomen and pelvis was performed using the standard protocol following bolus administration of intravenous contrast. RADIATION DOSE REDUCTION: This exam was performed according to the departmental dose-optimization program which includes automated exposure control, adjustment of the mA and/or kV according to patient size and/or use of iterative reconstruction technique. CONTRAST:  OMNIPAQUE IOHEXOL 300 MG/ML  SOLN COMPARISON:  None Available. FINDINGS: Lower chest: No acute abnormality. Hepatobiliary: No focal liver abnormality is seen. No gallstones, gallbladder wall thickening, or biliary dilatation. Pancreas: Unremarkable. No pancreatic ductal dilatation or surrounding inflammatory changes. Spleen: Normal in size without focal abnormality. Adrenals/Urinary Tract: Adrenal glands appear normal. Bilateral renal cysts are noted for which no further follow-up is required. No hydronephrosis or renal obstruction is noted. Suprapubic catheter is noted in urinary bladder.  Enlarged prostate is seen extending into urinary bladder consistent with history of prostate cancer. Stomach/Bowel: Stomach is within normal limits. Appendix appears normal. No evidence of bowel wall thickening, distention, or inflammatory changes. Vascular/Lymphatic: Aortic atherosclerosis. No enlarged abdominal or pelvic lymph nodes. IVC filter is noted in infrarenal position. Reproductive: As noted above, enlarged prostate gland is noted with extension into inferior portion of urinary bladder consistent with history of prostatic carcinoma. Other: No ascites or hernia is noted. Musculoskeletal: No acute or significant osseous findings. IMPRESSION: Enlarged prostate gland is noted which extends into urinary bladder consistent with history of prostate cancer. Suprapubic bladder catheter is noted. Aortic Atherosclerosis (ICD10-I70.0). Electronically Signed   By: Lupita Raider M.D.   On: 10/08/2023 09:47    LOS: 0 days  Total time spent in review of labs and imaging, patient evaluation, formulation of plan, documentation and communication with patient/family: 35 minutes  Lanae Boast, MD [facility] 10/09/2023, 11:22 AM

## 2023-10-09 NOTE — TOC Initial Note (Signed)
 Transition of Care [facility]) - Initial/Assessment Note    Patient Details  Name: Nathan [name] MRN: [id number] Date of Birth: [birth year]  Transition of Care [facility]) CM/SW Contact:    Lanier Clam, RN Phone Number: 10/09/2023, 11:01 AM  Clinical Narrative: d/c plan home. Used [facility] in past will use again if needed. PT cons-await recc.                  Expected Discharge Plan: Home/Self Care Barriers to Discharge: Continued Medical Work up   Patient Goals and CMS Choice Patient states their goals for this hospitalization and ongoing recovery are:: Home CMS Medicare.gov Compare Post Acute Care list provided to:: Patient Choice offered to / list presented to : Patient [place] ownership interest in [facility] [place] [facility].provided to:: Patient    Expected Discharge Plan and Services   Discharge Planning Services: CM Consult   Living arrangements for the past 2 months: Single Family Home                                      Prior Living Arrangements/Services Living arrangements for the past 2 months: Single Family Home Lives with:: Spouse, Adult Children              Current home services: DME (cane, rollator,shower chair)    Activities of Daily Living   ADL Screening (condition at time of admission) Independently performs ADLs?: Yes (appropriate for developmental age) Is the patient deaf or have difficulty hearing?: No Does the patient have difficulty seeing, even when wearing glasses/contacts?: No Does the patient have difficulty concentrating, remembering, or making decisions?: No  Permission Sought/Granted                  Emotional Assessment              Admission diagnosis:  Hematuria [R31.9] Hematuria, unspecified type [R31.9] Patient Active Problem List   Diagnosis Date Noted   Hematuria 10/08/2023   Acute blood loss anemia 02/15/2023   Chronic deep vein thrombosis (DVT) (HCC) 09/17/2022   ABLA (acute blood loss anemia) 02/27/2022    Fever 01/24/2022   Acute blood loss anemia (ABLA) 01/24/2022   History of DVT (deep vein thrombosis) 01/12/2022   Gross hematuria 01/12/2022   Symptomatic anemia 01/11/2022   Acute DVT (deep venous thrombosis) (HCC) 12/09/2021   UTI (urinary tract infection) 12/09/2021   Essential hypertension 12/09/2021   Type 2 diabetes mellitus without complication, without long-term current use of insulin (HCC) 12/09/2021   Normocytic anemia 12/09/2021   Malignant neoplasm of prostate (HCC) 07/07/2019   Paroxysmal atrial fibrillation (HCC) 06/05/2019   PCP:  Dois Davenport, MD Pharmacy:   [facility] [address] [place], [place] - [address] [place] [address] Phone: [phone] Fax: [phone]     Social Drivers of Health (SDOH) Social History: SDOH Screenings   Food Insecurity: No Food Insecurity (10/08/2023)  Housing: Low Risk  (10/08/2023)  Transportation Needs: No Transportation Needs (10/08/2023)  Utilities: Not At Risk (10/08/2023)  Social Connections: Patient Declined (10/08/2023)  Tobacco Use: Medium Risk (10/08/2023)   SDOH Interventions:     Readmission Risk Interventions    02/18/2023    9:13 AM 02/28/2022    1:23 PM 02/14/2022    9:26 AM  Readmission Risk Prevention Plan  Transportation Screening Complete Complete Complete  PCP or Specialist Appt within 5-7 Days Complete  Complete  PCP or Specialist Appt within 3-5 Days  Complete   Home Care Screening Complete  Complete  Medication Review (RN CM) Complete  Complete  HRI or Home Care Consult  Complete   Social Work Consult for Recovery Care Planning/Counseling  Complete   Palliative Care Screening  Not Applicable   Medication Review Oceanographer)  Complete

## 2023-10-09 NOTE — Care Management Obs Status (Signed)
 MEDICARE OBSERVATION STATUS NOTIFICATION   Patient Details  Name: Nathan [name] MRN: [id number] Date of Birth: 1945/11/02   Medicare Observation Status Notification Given:  Yes    MahabirOlegario Messier, RN 10/09/2023, 1:42 PM

## 2023-10-10 ENCOUNTER — Encounter (HOSPITAL_COMMUNITY)
Admission: EM | Disposition: A | Discharge status: Home / Self Care | Payer: Self-pay | Source: Home / Self Care | Attending: Internal Medicine

## 2023-10-10 ENCOUNTER — Inpatient Hospital Stay (HOSPITAL_COMMUNITY): Admitting: Anesthesiology

## 2023-10-10 ENCOUNTER — Encounter (HOSPITAL_COMMUNITY): Payer: Self-pay | Admitting: Internal Medicine

## 2023-10-10 DIAGNOSIS — N3041 Irradiation cystitis with hematuria: Secondary | ICD-10-CM

## 2023-10-10 DIAGNOSIS — I10 Essential (primary) hypertension: Secondary | ICD-10-CM

## 2023-10-10 DIAGNOSIS — E119 Type 2 diabetes mellitus without complications: Secondary | ICD-10-CM

## 2023-10-10 DIAGNOSIS — I4891 Unspecified atrial fibrillation: Secondary | ICD-10-CM

## 2023-10-10 DIAGNOSIS — R310 Gross hematuria: Secondary | ICD-10-CM | POA: Diagnosis not present

## 2023-10-10 HISTORY — PX: CYSTOSCOPY WITH FULGERATION: SHX6638

## 2023-10-10 LAB — HEMOGLOBIN AND HEMATOCRIT, BLOOD
HCT: 28.4 % — ABNORMAL LOW (ref 39.0–52.0)
HCT: 26.6 % — ABNORMAL LOW (ref 39.0–52.0)
Hemoglobin: 8.7 g/dL — ABNORMAL LOW (ref 13.0–17.0)
Hemoglobin: 8.1 g/dL — ABNORMAL LOW (ref 13.0–17.0)

## 2023-10-10 LAB — GLUCOSE, CAPILLARY
Glucose-Capillary: 95 mg/dL (ref 70–99)
Glucose-Capillary: 84 mg/dL (ref 70–99)
Glucose-Capillary: 86 mg/dL (ref 70–99)
Glucose-Capillary: 124 mg/dL — ABNORMAL HIGH (ref 70–99)
Glucose-Capillary: 325 mg/dL — ABNORMAL HIGH (ref 70–99)

## 2023-10-10 SURGERY — CYSTOSCOPY, WITH BLADDER FULGURATION
Anesthesia: General

## 2023-10-10 MED ORDER — 0.9 % SODIUM CHLORIDE (POUR BTL) OPTIME
TOPICAL | Status: DC | PRN
  Administered 2023-10-10: 1000 mL

## 2023-10-10 MED ORDER — INSULIN ASPART 100 UNIT/ML IJ SOLN: 0.0000 [IU] | INTRAMUSCULAR | Status: DC | PRN

## 2023-10-10 MED ORDER — PHENYLEPHRINE 80 MCG/ML (10ML) SYRINGE FOR IV PUSH (FOR BLOOD PRESSURE SUPPORT)
PREFILLED_SYRINGE | INTRAVENOUS | Status: DC | PRN
  Administered 2023-10-10: 80 ug via INTRAVENOUS

## 2023-10-10 MED ORDER — ONDANSETRON HCL 4 MG/2ML IJ SOLN
INTRAMUSCULAR | Status: DC | PRN
  Administered 2023-10-10: 4 mg via INTRAVENOUS

## 2023-10-10 MED ORDER — FENTANYL CITRATE (PF) 100 MCG/2ML IJ SOLN
INTRAMUSCULAR | Status: DC | PRN
  Administered 2023-10-10 (×2): 50 ug via INTRAVENOUS

## 2023-10-10 MED ORDER — DEXAMETHASONE SODIUM PHOSPHATE 10 MG/ML IJ SOLN
INTRAMUSCULAR | Status: DC | PRN
  Administered 2023-10-10: 8 mg via INTRAVENOUS

## 2023-10-10 MED ORDER — PROPOFOL 10 MG/ML IV BOLUS
INTRAVENOUS | Status: AC
  Filled 2023-10-10: qty 20

## 2023-10-10 MED ORDER — PROPOFOL 500 MG/50ML IV EMUL
INTRAVENOUS | Status: AC
  Filled 2023-10-10: qty 50

## 2023-10-10 MED ORDER — PROPOFOL 10 MG/ML IV BOLUS
INTRAVENOUS | Status: DC | PRN
  Administered 2023-10-10: 50 mg via INTRAVENOUS
  Administered 2023-10-10: 150 mg via INTRAVENOUS

## 2023-10-10 MED ORDER — LACTATED RINGERS IV SOLN: INTRAVENOUS | Status: DC

## 2023-10-10 MED ORDER — SODIUM CHLORIDE 0.9 % IR SOLN
Status: DC | PRN
  Administered 2023-10-10: 3000 mL

## 2023-10-10 MED ORDER — LIDOCAINE HCL (CARDIAC) PF 100 MG/5ML IV SOSY
PREFILLED_SYRINGE | INTRAVENOUS | Status: DC | PRN
Start: 2023-10-10 — End: 2023-10-10
  Administered 2023-10-10: 100 mg via INTRATRACHEAL

## 2023-10-10 MED ORDER — CHLORHEXIDINE GLUCONATE 0.12 % MT SOLN
15.0000 mL | Freq: Once | OROMUCOSAL | Status: AC
  Administered 2023-10-10: 15 mL via OROMUCOSAL

## 2023-10-10 MED ORDER — FENTANYL CITRATE (PF) 100 MCG/2ML IJ SOLN
INTRAMUSCULAR | Status: AC
  Filled 2023-10-10: qty 2

## 2023-10-10 MED ORDER — LIDOCAINE HCL (PF) 2 % IJ SOLN
INTRAMUSCULAR | Status: AC
  Filled 2023-10-10: qty 5

## 2023-10-10 MED ORDER — LACTATED RINGERS IV SOLN: INTRAVENOUS | Status: DC | PRN

## 2023-10-10 SURGICAL SUPPLY — 14 items
BAG URINE DRAIN 2000ML AR STRL (UROLOGICAL SUPPLIES) IMPLANT
BAG URO CATCHER STRL LF (MISCELLANEOUS) ×1 IMPLANT
DRAPE FOOT SWITCH (DRAPES) ×1 IMPLANT
ELECT REM PT RETURN 15FT ADLT (MISCELLANEOUS) ×1 IMPLANT
GLOVE SURG LX STRL 7.5 STRW (GLOVE) ×1 IMPLANT
GOWN STRL REUS W/ TWL XL LVL3 (GOWN DISPOSABLE) ×1 IMPLANT
KIT TURNOVER KIT A (KITS) IMPLANT
LOOP CUT BIPOLAR 24F LRG (ELECTROSURGICAL) IMPLANT
MANIFOLD NEPTUNE II (INSTRUMENTS) ×1 IMPLANT
PACK CYSTO (CUSTOM PROCEDURE TRAY) ×1 IMPLANT
SYR TOOMEY IRRIG 70ML (MISCELLANEOUS) IMPLANT
SYRINGE TOOMEY IRRIG 70ML (MISCELLANEOUS) IMPLANT
TUBING CONNECTING 10 (TUBING) ×1 IMPLANT
TUBING UROLOGY SET (TUBING) ×1 IMPLANT

## 2023-10-10 NOTE — Anesthesia Procedure Notes (Signed)
 Procedure Name: LMA Insertion Date/Time: 10/10/2023 1:35 PM  Performed by: Micki Riley, CRNAPre-anesthesia Checklist: Patient identified, Emergency Drugs available, Suction available and Patient being monitored Patient Re-evaluated:Patient Re-evaluated prior to induction Oxygen Delivery Method: Circle System Utilized Preoxygenation: Pre-oxygenation with 100% oxygen Induction Type: IV induction Ventilation: Mask ventilation without difficulty LMA: LMA inserted LMA Size: 5.0 Number of attempts: 1 Airway Equipment and Method: Bite block Placement Confirmation: positive ETCO2 and breath sounds checked- equal and bilateral Tube secured with: Tape Dental Injury: Teeth and Oropharynx as per pre-operative assessment

## 2023-10-10 NOTE — Progress Notes (Signed)
 Mobility Specialist - Progress Note   10/10/23 1129  Mobility  Activity Ambulated with assistance in hallway  Level of Assistance Modified independent, requires aide device or extra time  Assistive Device Cane  Distance Ambulated (ft) 500 ft  Activity Response Tolerated well  Mobility Referral Yes  Mobility visit 1 Mobility  Mobility Specialist Start Time (ACUTE ONLY) 1119  Mobility Specialist Stop Time (ACUTE ONLY) 1129  Mobility Specialist Time Calculation (min) (ACUTE ONLY) 10 min   Pt received in bed and agreeable to mobility. No complaints during session. Pt to recliner after session with all needs met.   [facility]

## 2023-10-10 NOTE — Transfer of Care (Signed)
 Immediate Anesthesia Transfer of Care Note  Patient: Nathan [name]  Procedure(s) Performed: CYSTOSCOPY, WITH BLADDER FULGURATION  Patient Location: PACU  Anesthesia Type:General  Level of Consciousness: sedated and responds to stimulation  Airway & Oxygen Therapy: Patient Spontanous Breathing and Patient connected to nasal cannula oxygen  Post-op Assessment: Report given to RN and Post -op Vital signs reviewed and stable  Post vital signs: Reviewed and stable  Last Vitals:  Vitals Value Taken Time  BP 127/70 10/10/23 1409  Temp 36.8   Pulse 69 10/10/23 1413  Resp 15 10/10/23 1413  SpO2 100 % 10/10/23 1413  Vitals shown include unfiled device data.  Last Pain:  Vitals:   10/10/23 1219  TempSrc:   PainSc: 0-No pain      Patients Stated Pain Goal: 5 (10/10/23 1219)  Complications: No notable events documented.

## 2023-10-10 NOTE — Anesthesia Preprocedure Evaluation (Signed)
 Anesthesia Evaluation  Patient identified by MRN, date of birth, ID band Patient awake    Reviewed: Allergy & Precautions, NPO status , Patient's Chart, lab work & pertinent test results  Airway Mallampati: II  TM Distance: >3 FB Neck ROM: Full    Dental  (+) Dental Advisory Given   Pulmonary former smoker   breath sounds clear to auscultation       Cardiovascular hypertension, + dysrhythmias Atrial Fibrillation  Rhythm:Regular Rate:Normal     Neuro/Psych negative neurological ROS     GI/Hepatic   Endo/Other  diabetes, Type 2    Renal/GU      Musculoskeletal   Abdominal   Peds  Hematology  (+) Blood dyscrasia, anemia   Anesthesia Other Findings   Reproductive/Obstetrics                             Anesthesia Physical Anesthesia Plan  ASA: 3  Anesthesia Plan: General   Post-op Pain Management: Ofirmev IV (intra-op)* and Minimal or no pain anticipated   Induction: Intravenous  PONV Risk Score and Plan: 2 and Dexamethasone, Ondansetron and Treatment may vary due to age or medical condition  Airway Management Planned: LMA  Additional Equipment:   Intra-op Plan:   Post-operative Plan: Extubation in OR  Informed Consent: I have reviewed the patients History and Physical, chart, labs and discussed the procedure including the risks, benefits and alternatives for the proposed anesthesia with the patient or authorized representative who has indicated his/her understanding and acceptance.     Dental advisory given  Plan Discussed with: CRNA  Anesthesia Plan Comments:        Anesthesia Quick Evaluation

## 2023-10-10 NOTE — Progress Notes (Signed)
 PROGRESS NOTE Nathan [name]  ZOX:[id number] DOB: April 28, 1946 DOA: 10/08/2023 PCP: Dois Davenport, MD  Brief Narrative/Hospital Course: 78 y.o.m history of prostate cancer-S/P XRT in 2021 , hypertension, diabetes, history of A-fib, DVT no longer on anticoagulation, chronic SP catheter, history of right iliac stent last admission 02/23/23 for UTI with catheter, hematuria/ABLA needing 3 units PRBC and coil embolization right prostatic artery.  History of CAD with drop in hemoglobin 3.8 and hematuria for at least 2 weeks.  Foley catheter changed by urology on 4/7 and blood work obtained. In the ED-hemodynamically stable afebrile.  Labs hemoglobin down to 7.5g b/l ~7.9 on 02/23/2023,UA WBC >50 RBC >50 bacteria few Urology consulted and admission requested under TRH.  Significant testing/procedure: 4/8>CT abdomen pelvis with contrast>> enlarged prostate extending into the urinary bladder consistent with prostate cancer SP catheter in place aortic atherosclerosis  Consultation: Urology  Subjective Seen and examined Resting comfortably Suprapubic catheter with hematuria history of present Overnight afebrile BP stable hemoglobin stable at 8.7  N.p.o. 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 S/p Catheter irrigated by urology.  Tentative plan for OR  4/10 for cystoscopy clot evacuation and fulguration Continue empiric antibiotics.  ABLA on chronic anemia: Due to hematuria S/P 1 unit PRBC,  hb is stable   Hypertension: BP stable. hold Aldactone - seems not taking it.  T2DM: Blood sugar controlled on ssi, monitor.   History of A-fib DVT history of right iliac stent: no longer on anticoagulation    DVT prophylaxis: SCDs Start: 10/08/23  1153 Code Status:   Code Status: Full Code Family Communication: plan of care discussed with patient at bedside. Patient status is: Remains hospitalized because of severity of illness Level of care: Telemetry   Dispo: The patient is from: home w/ daughter            Anticipated disposition: TBD  Objective: Vitals last 24 hrs: Vitals:   10/09/23 0643 10/09/23 1316 10/09/23 2049 10/10/23 0439  BP: 137/83 (!) 155/71 (!) 169/75 124/66  Pulse: 87 77 76 79  Resp: 19 16 16 14   Temp: 98.3 F (36.8 C) (!) 97.4 F (36.3 C) 99.1 F (37.3 C) 98.3 F (36.8 C)  TempSrc: Oral  Oral Oral  SpO2: 100% 100% 100% 97%  Weight:      Height:       Weight change:   Physical Examination: General exam: alert awake, oriented at baseline, older than stated age HEENT:Oral mucosa moist, Ear/Nose WNL grossly Respiratory system: Bilaterally clear BS,no use of accessory muscle Cardiovascular system: S1 & S2 +, No JVD. Gastrointestinal system: Abdomen soft,NT,ND, BS+ Nervous System: Alert, awake, moving all extremities,and following commands. Extremities: LE edema neg,distal peripheral pulses palpable and warm.  Skin: No rashes,no icterus. MSK: Normal muscle bulk,tone, power  Suprapubic catheter in place w/ red urine  Medications reviewed:  Scheduled Meds:  ferrous sulfate  325 mg Oral QODAY   finasteride  5 mg Oral Daily   folic acid  1 mg Oral Daily   gabapentin  300 mg Oral TID   insulin aspart  0-9 Units Subcutaneous TID WC   magnesium oxide  400 mg Oral Daily   Continuous Infusions:  cefTRIAXone (ROCEPHIN)  IV 1 g (10/09/23 1414)    Diet Order  Diet NPO time specified  Diet effective midnight                   Intake/Output Summary (Last 24 hours) at 10/10/2023 1114 Last data filed at 10/10/2023 1016 Gross per 24 hour  Intake 360 ml  Output 1900 ml  Net -1540 ml   Net IO Since Admission: -1,932.23 mL [10/10/23 1114]  Wt Readings from Last 3 Encounters:  10/08/23  82.2 kg  02/15/23 77.1 kg  10/17/22 79.4 kg     Unresulted Labs (From admission, onward)    None     Data Reviewed: I have personally reviewed following labs and imaging studies ( see epic result tab) CBC: Recent Labs  Lab 10/08/23 0322 10/08/23 2302 10/09/23 0639 10/09/23 1252 10/09/23 1823 10/10/23 0202 10/10/23 0809  WBC 6.1  --   --   --   --   --   --   HGB 7.5*   < > 8.0* 9.0* 9.0* 8.7* 8.1*  HCT 25.1*   < > 26.9* 30.3* 29.7* 28.4* 26.6*  MCV 96.9  --   --   --   --   --   --   PLT 305  --   --   --   --   --   --    < > = values in this interval not displayed.   CMP: Recent Labs  Lab 10/08/23 0322  NA 138  K 4.0  CL 107  CO2 24  GLUCOSE 112*  BUN 14  CREATININE 1.09  CALCIUM 8.7*   Recent Labs    10/08/23 2302  HGBA1C 5.3   Recent Labs  Lab 10/09/23 0741 10/09/23 1147 10/09/23 1650 10/09/23 2052 10/10/23 0748  GLUCAP 88 132* 108* 94 95  Antimicrobials/Microbiology: Anti-infectives (From admission, onward)    Start     Dose/Rate Route Frequency Ordered Stop   10/08/23 1200  cefTRIAXone (ROCEPHIN) 1 g in sodium chloride 0.9 % 100 mL IVPB        1 g 200 mL/hr over 30 Minutes Intravenous Every 24 hours 10/08/23 1152 10/13/23 1159         Component Value Date/Time   SDES  02/15/2023 0919    URINE, RANDOM Performed at [facility], [address], [place], [place] [address]    SPECREQUEST  02/15/2023 0919    NONE Reflexed from [id number] Performed at [facility], [address], [place], [place] [address]    CULT (A) 02/15/2023 0919    >=100,000 COLONIES/mL CITROBACTER KOSERI 50,000 COLONIES/mL PSEUDOMONAS AERUGINOSA 50,000 COLONIES/mL ESCHERICHIA COLI    REPTSTATUS 02/18/2023 FINAL 02/15/2023 0919     Radiology Studies: No results found.   LOS: 1 day  Total time spent in review of labs and imaging, patient evaluation, formulation of plan, documentation and communication with patient/family: 35  minutes  Lanae Boast, MD [facility] 10/10/2023, 11:14 AM

## 2023-10-10 NOTE — Progress Notes (Signed)
 Patient ID: Nathan [name], male   DOB: 05-Mar-1946, 78 y.o.   MRN: [id number]    Subjective: No new complaints.  Objective: Vital signs in last 24 hours: Temp:  [97.4 F (36.3 C)-99.1 F (37.3 C)] 98.3 F (36.8 C) (04/10 0439) Pulse Rate:  [76-79] 79 (04/10 0439) Resp:  [14-16] 14 (04/10 0439) BP: (124-169)/(66-75) 124/66 (04/10 0439) SpO2:  [97 %-100 %] 97 % (04/10 0439)  Intake/Output from previous day: 04/09 0701 - 04/10 0700 In: 720 [P.O.:720] Out: 1600 [Urine:1600] Intake/Output this shift: No intake/output data recorded.  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  Spoke with patient's daughter, Quenten Raven. Will plan to proceed with cystoscopy, clot evacuation, and possible fulguration this afternoon. I discussed the potential benefits and risks of the procedure, side effects of the proposed treatment, the likelihood of the patient achieving the goals of the procedure, and any potential problems that might occur during the procedure or recuperation.     LOS: 1 day   Crecencio Mc 10/10/2023, 7:18 AM

## 2023-10-10 NOTE — Op Note (Signed)
 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  Cystoscopy 2.  Clot evacuation 3.  Fulguration of prostate and bladder  Surgeon: Moody Bruins MD  Anesthesia: General  Complications: None  EBL: Minimal  Specimens: None  Indication: Mr. Nathan [name] is an 78 year old gentleman with a history of radiation therapy for prostate cancer.  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  He presented again to the hospital with significant anemia requiring transfusion.  He presents today for the above procedures.  The potential risks, complications, and expected recovery process have been discussed in detail.  Informed consent was obtained.  Description of procedure: The patient was taken to the operating room and general anesthetic was administered.  He was given preoperative antibiotics, placed in the dorsolithotomy position, and prepped and draped in usual sterile fashion.  Next, preoperative timeout was performed.  Cystourethroscopy was performed after the suprapubic tube was clamped.  This did reveal a large amount of clot within the bladder with some areas of oozing bleeding around the bladder neck.  The cystoscope was removed and replaced with a 26 French resectoscope.  The clot was then evacuated from the bladder.  There was at least a moderate to large amount of clot removed.  Reinspection with the resectoscope revealed ongoing bleeding from the bladder neck as well as 1 area within the bladder.  All of these areas were fulgurated with bipolar cautery.  The resectoscope was then carefully removed and the suprapubic tube was placed back to drainage.  The patient was able to be awakened and transferred to recovery in satisfactory condition.

## 2023-10-10 NOTE — Anesthesia Postprocedure Evaluation (Signed)
 Anesthesia Post Note  Patient: Nathan [name]  Procedure(s) Performed: CYSTOSCOPY, WITH BLADDER FULGURATION     Patient location during evaluation: PACU Anesthesia Type: General Level of consciousness: awake and alert Pain management: pain level controlled Vital Signs Assessment: post-procedure vital signs reviewed and stable Respiratory status: spontaneous breathing, nonlabored ventilation, respiratory function stable and patient connected to nasal cannula oxygen Cardiovascular status: blood pressure returned to baseline and stable Postop Assessment: no apparent nausea or vomiting Anesthetic complications: no  No notable events documented.  Last Vitals:  Vitals:   10/10/23 1445 10/10/23 1522  BP: (!) 140/67 139/72  Pulse: 70 75  Resp: 12 19  Temp: (!) 36.4 C (!) 36.4 C  SpO2: 98% 98%    Last Pain:  Vitals:   10/10/23 1522  TempSrc: Oral  PainSc:                  Kennieth Rad

## 2023-10-11 ENCOUNTER — Encounter (HOSPITAL_COMMUNITY): Payer: Self-pay | Admitting: Urology

## 2023-10-11 DIAGNOSIS — R310 Gross hematuria: Secondary | ICD-10-CM | POA: Diagnosis not present

## 2023-10-11 LAB — CBC
HCT: 27.8 % — ABNORMAL LOW (ref 39.0–52.0)
Hemoglobin: 8.4 g/dL — ABNORMAL LOW (ref 13.0–17.0)
MCH: 28.8 pg (ref 26.0–34.0)
MCHC: 30.2 g/dL (ref 30.0–36.0)
MCV: 95.2 fL (ref 80.0–100.0)
Platelets: 304 10*3/uL (ref 150–400)
RBC: 2.92 MIL/uL — ABNORMAL LOW (ref 4.22–5.81)
RDW: 16.6 % — ABNORMAL HIGH (ref 11.5–15.5)
WBC: 5.6 10*3/uL (ref 4.0–10.5)
nRBC: 0 % (ref 0.0–0.2)

## 2023-10-11 LAB — BASIC METABOLIC PANEL WITH GFR
Anion gap: 9 (ref 5–15)
BUN: 11 mg/dL (ref 8–23)
CO2: 23 mmol/L (ref 22–32)
Calcium: 8.8 mg/dL — ABNORMAL LOW (ref 8.9–10.3)
Chloride: 103 mmol/L (ref 98–111)
Creatinine, Ser: 0.98 mg/dL (ref 0.61–1.24)
GFR, Estimated: >60 mL/min (ref >60)
Glucose, Bld: 152 mg/dL — ABNORMAL HIGH (ref 70–99)
Potassium: 4.3 mmol/L (ref 3.5–5.1)
Sodium: 135 mmol/L (ref 135–145)

## 2023-10-11 LAB — GLUCOSE, CAPILLARY: Glucose-Capillary: 139 mg/dL — ABNORMAL HIGH (ref 70–99)

## 2023-10-11 NOTE — Progress Notes (Signed)
 Patient ID: Nathan [name], male   DOB: 12-18-1945, 78 y.o.   MRN: [id number]  1 Day Post-Op Subjective: Doing well s/p cystoscopy, clot evacuation, and fulguration of bladder neck and bladder oozing sites.  Objective: Vital signs in last 24 hours: Temp:  [97.1 F (36.2 C)-98.8 F (37.1 C)] 97.9 F (36.6 C) (04/11 0418) Pulse Rate:  [69-92] 82 (04/11 0418) Resp:  [12-19] 14 (04/11 0418) BP: (107-151)/(65-85) 107/65 (04/11 0418) SpO2:  [98 %-100 %] 100 % (04/11 0418) Weight:  [82.2 kg] 82.2 kg (04/10 1219)  Intake/Output from previous day: 04/10 0701 - 04/11 0700 In: 637 [I.V.:450.2; IV Piggyback:186.8] Out: 1400 [Urine:1400] Intake/Output this shift: No intake/output data recorded.  Physical Exam:  General: Alert and oriented GU: Urine clear   Lab Results: Recent Labs    10/10/23 0202 10/10/23 0809 10/11/23 0406  HGB 8.7* 8.1* 8.4*  HCT 28.4* 26.6* 27.8*   BMET Recent Labs    10/11/23 0406  NA 135  K 4.3  CL 103  CO2 23  GLUCOSE 152*  BUN 11  CREATININE 0.98  CALCIUM 8.8*     Studies/Results: No results found.  Assessment/Plan: 1) Hematuria due to radiation cystitis/prostatitis: Urine clear now.  Ok for discharge from urologic perspective.   LOS: 2 days   Crecencio Mc 10/11/2023, 7:10 AM

## 2023-10-11 NOTE — Discharge Summary (Signed)
 Physician Discharge Summary  Nathan [name] ION:[id number] DOB: [birth year] DOA: 10/08/2023  PCP: Dois Davenport, MD  Admit date: 10/08/2023 Discharge date: 10/11/2023 Recommendations for Outpatient Follow-up:  Follow up with PCP and urology in 1 weeks-call for appointment Please obtain BMP/CBC in one week  Discharge Dispo: Home Discharge Condition: Stable Code Status:   Code Status: Full Code Diet recommendation:  Diet Order             DIET SOFT Room service appropriate? Yes; Fluid consistency: Thin  Diet effective now                    Brief/Interim Summary: 77 y.o.m history of prostate cancer-S/P XRT in 2021 , hypertension, diabetes, history of A-fib, DVT no longer on anticoagulation, chronic SP catheter, history of right iliac stent last admission 02/23/23 for UTI with catheter, hematuria/ABLA needing 3 units PRBC and coil embolization right prostatic artery.  History of CAD with drop in hemoglobin 3.8 and hematuria for at least 2 weeks.  Foley catheter changed by urology on 4/7 and blood work obtained. In the ED-hemodynamically stable afebrile.  Labs hemoglobin down to 7.5g b/l ~7.9 on 02/23/2023,UA WBC >50 RBC >50 bacteria few Urology consulted and admission requested under [facility].  Underwent flushing of the suprapubic catheter, 4/10 Cystoscopy with clot evacuation/fulguration of bladder neck and bladder oozing site.  Postop doing well patient has been cleared for discharge by urology hematuria has resolved.  Significant testing/procedure: 4/8>CT abdomen pelvis with contrast>> enlarged prostate extending into the urinary bladder consistent with prostate cancer SP catheter in place aortic atherosclerosis 4/10 Cystoscopy with clot evacuation/fulguration of bladder neck and bladder oozing site.   Consultation: Urology  Subjective Seen and examined Hematuria resolved.  No complaints   Discharge diagnoses:  Prostate Cancer s/p XRT Hx if Prostatic artery  embolization Hematuria Hematuria likely related to radiation cystitis or prostatitis, urology following CT Abd- shows significant encroachment of the prostate into the bladder, similar to imaging last year. S/p Catheter irrigated by urology.>/10 Cystoscopy with clot evacuation/fulguration of bladder neck and bladder oozing site. Hematuria resolved.  Okay for discharge today  ABLA on chronic anemia: Due to hematuria S/P 1 unit PRBC,  hb is stable and trending up Recent Labs  Lab 10/09/23 1252 10/09/23 1823 10/10/23 0202 10/10/23 0809 10/11/23 0406  HGB 9.0* 9.0* 8.7* 8.1* 8.4*  HCT 30.3* 29.7* 28.4* 26.6* 27.8*    Hypertension: BP stable. hold Aldactone - seems not taking it.  T2DM: Blood sugar poorly controlled > likely postop,    History of A-fib DVT history of right iliac stent: no longer on anticoagulation   Discharge Exam: Vitals:   10/10/23 1951 10/11/23 0418  BP: 129/67 107/65  Pulse: 91 82  Resp: 15 14  Temp: 98.1 F (36.7 C) 97.9 F (36.6 C)  SpO2: 99% 100%   General: Pt is alert, awake, not in acute distress Cardiovascular: RRR, S1/S2 +, no rubs, no gallops Respiratory: CTA bilaterally, no wheezing, no rhonchi Abdominal: Soft, NT, ND, bowel sounds + Extremities: no edema, no cyanosis  Discharge Instructions  Discharge Instructions     Ambulatory Referral for Lung Cancer Scre   Complete by: As directed    Discharge instructions   Complete by: As directed    Please call call MD or return to ER for similar or worsening recurring problem that brought you to hospital or if any fever,nausea/vomiting,abdominal pain, uncontrolled pain, chest pain,  shortness of breath or any other alarming symptoms.  Please follow-up  your doctor as instructed in a week time and call the office for appointment.  Please avoid alcohol, smoking, or any other illicit substance and maintain healthy habits including taking your regular medications as prescribed.  You were cared  for by a hospitalist during your hospital stay. If you have any questions about your discharge medications or the care you received while you were in the hospital after you are discharged, you can call the unit and ask to speak with the hospitalist on call if the hospitalist that took care of you is not available.  Once you are discharged, your primary care physician will handle any further medical issues. Please note that NO REFILLS for any discharge medications will be authorized once you are discharged, as it is imperative that you return to your primary care physician (or establish a relationship with a primary care physician if you do not have one) for your aftercare needs so that they can reassess your need for medications and monitor your lab values   Increase activity slowly   Complete by: As directed       Allergies as of 10/11/2023   No Known Allergies      Medication List     TAKE these medications    albuterol 108 (90 Base) MCG/ACT inhaler Commonly known as: VENTOLIN HFA Inhale into the lungs.   ergocalciferol 1.25 MG (50000 UT) capsule Commonly known as: VITAMIN D2 Take 50,000 Units by mouth once a week. Patient usually takes this Sundays   ferrous sulfate 325 (65 FE) MG tablet Take 1 tablet (325 mg total) by mouth every other day.   finasteride 5 MG tablet Commonly known as: PROSCAR Take 5 mg by mouth daily.   folic acid 1 MG tablet Commonly known as: FOLVITE Take 1 mg by mouth daily.   gabapentin 300 MG capsule Commonly known as: NEURONTIN Take 300 mg by mouth 3 (three) times daily.   magnesium oxide 400 MG tablet Commonly known as: MAG-OX Take 400 mg by mouth daily.   OVER THE COUNTER MEDICATION Take 1 capsule by mouth daily as needed (Siatic Nerve pain). Sciatiease   OVER THE COUNTER MEDICATION Take 1 capsule by mouth daily. Methylate 1 daily   prenatal multivitamin Tabs tablet Take 1 tablet by mouth daily at 12 noon.   spironolactone 25 MG  tablet Commonly known as: ALDACTONE Take 25 mg by mouth daily.        Follow-up Information     Dois Davenport, MD Follow up in 1 week(s).   Specialty: Family Medicine Contact information: [address] [place] [address] [phone]         Heloise Purpura, MD Follow up in 1 week(s).   Specialty: Urology Contact information: [address] [place] [address] [phone]                No Known Allergies  The results of significant diagnostics from this hospitalization (including imaging, microbiology, ancillary and laboratory) are listed below for reference.    Microbiology: No results found for this or any previous visit (from the past 240 hours).  Procedures/Studies: CT ABDOMEN PELVIS W CONTRAST Result Date: 10/08/2023 CLINICAL DATA:  Gross hematuria.  History of prostate cancer. EXAM: CT ABDOMEN AND PELVIS WITH CONTRAST TECHNIQUE: Multidetector CT imaging of the abdomen and pelvis was performed using the standard protocol following bolus administration of intravenous contrast. RADIATION DOSE REDUCTION: This exam was performed according to the departmental dose-optimization program which includes automated  exposure control, adjustment of the mA and/or kV according to patient size and/or use of iterative reconstruction technique. CONTRAST:  OMNIPAQUE IOHEXOL 300 MG/ML  SOLN COMPARISON:  None Available. FINDINGS: Lower chest: No acute abnormality. Hepatobiliary: No focal liver abnormality is seen. No gallstones, gallbladder wall thickening, or biliary dilatation. Pancreas: Unremarkable. No pancreatic ductal dilatation or surrounding inflammatory changes. Spleen: Normal in size without focal abnormality. Adrenals/Urinary Tract: Adrenal glands appear normal. Bilateral renal cysts are noted for which no further follow-up is required. No hydronephrosis or renal obstruction is noted. Suprapubic catheter is noted in urinary bladder. Enlarged prostate is  seen extending into urinary bladder consistent with history of prostate cancer. Stomach/Bowel: Stomach is within normal limits. Appendix appears normal. No evidence of bowel wall thickening, distention, or inflammatory changes. Vascular/Lymphatic: Aortic atherosclerosis. No enlarged abdominal or pelvic lymph nodes. IVC filter is noted in infrarenal position. Reproductive: As noted above, enlarged prostate gland is noted with extension into inferior portion of urinary bladder consistent with history of prostatic carcinoma. Other: No ascites or hernia is noted. Musculoskeletal: No acute or significant osseous findings. IMPRESSION: Enlarged prostate gland is noted which extends into urinary bladder consistent with history of prostate cancer. Suprapubic bladder catheter is noted. Aortic Atherosclerosis (ICD10-I70.0). Electronically Signed   By: Lupita Raider M.D.   On: 10/08/2023 09:47  Labs: BNP (last 3 results) No results for input(s): "BNP" in the last 8760 hours. Basic Metabolic Panel: Recent Labs  Lab 10/08/23 0322 10/11/23 0406  NA 138 135  K 4.0 4.3  CL 107 103  CO2 24 23  GLUCOSE 112* 152*  BUN 14 11  CREATININE 1.09 0.98  CALCIUM 8.7* 8.8*   Recent Labs  Lab 10/08/23 0322 10/08/23 2302 10/09/23 1252 10/09/23 1823 10/10/23 0202 10/10/23 0809 10/11/23 0406  WBC 6.1  --   --   --   --   --  5.6  HGB 7.5*   < > 9.0* 9.0* 8.7* 8.1* 8.4*  HCT 25.1*   < > 30.3* 29.7* 28.4* 26.6* 27.8*  MCV 96.9  --   --   --   --   --  95.2  PLT 305  --   --   --   --   --  304   < > = values in this interval not displayed.   Recent Labs  Lab 10/10/23 1131 10/10/23 1229 10/10/23 1757 10/10/23 2106 10/11/23 0732  GLUCAP 84 86 124* 325* 139*   Recent Labs    10/08/23 2302  HGBA1C 5.3  Urinalysis    Component Value Date/Time   COLORURINE RED (A) 10/08/2023 0322   APPEARANCEUR CLOUDY (A) 10/08/2023 0322   LABSPEC  10/08/2023 0322    TEST NOT REPORTED DUE TO COLOR INTERFERENCE OF URINE  PIGMENT   PHURINE  10/08/2023 0322    TEST NOT REPORTED DUE TO COLOR INTERFERENCE OF URINE PIGMENT   GLUCOSEU (A) 10/08/2023 0322    TEST NOT REPORTED DUE TO COLOR INTERFERENCE OF URINE PIGMENT   HGBUR (A) 10/08/2023 0322    TEST NOT REPORTED DUE TO COLOR INTERFERENCE OF URINE PIGMENT   BILIRUBINUR (A) 10/08/2023 0322    TEST NOT REPORTED DUE TO COLOR INTERFERENCE OF URINE PIGMENT   KETONESUR (A) 10/08/2023 0322    TEST NOT REPORTED DUE TO COLOR INTERFERENCE OF URINE PIGMENT   PROTEINUR (A) 10/08/2023 0322    TEST NOT REPORTED DUE TO COLOR INTERFERENCE OF URINE PIGMENT   NITRITE (A) 10/08/2023 0322  TEST NOT REPORTED DUE TO COLOR INTERFERENCE OF URINE PIGMENT   LEUKOCYTESUR (A) 10/08/2023 0322    TEST NOT REPORTED DUE TO COLOR INTERFERENCE OF URINE PIGMENT   Sepsis Labs Recent Labs  Lab 10/08/23 0322 10/11/23 0406  WBC 6.1 5.6   Microbiology No results found for this or any previous visit (from the past 240 hours).  Time coordinating discharge: 35 minutes  SIGNED: Lanae Boast, MD  [facility] 10/11/2023, 9:59 AM  If 7PM-7AM, please contact night-coverage [email or web address]

## 2023-10-11 NOTE — Progress Notes (Signed)
 Pt iv's removed.  Discharge instructions given.  Reminded of follow up appointments.  Pt requested 2 leg bags for home use with suprapubic catheter.  Refused a standard bag.  Transportation provided by daughter.

## 2023-11-10 IMAGING — CT CT ABD-PELV W/ CM
2 of 5 series · 15 of 46 positions shown, 17 images · IV contrast (agent unspecified)
Comparison: 02/22/2020.

CLINICAL DATA: Macroscopic hematuria on urinalysis. Possible
bladder mass.

EXAM:
CT ABDOMEN AND PELVIS WITH CONTRAST
TECHNIQUE: Multidetector CT imaging of the abdomen and pelvis was performed
using the standard protocol following bolus administration of
intravenous contrast.

[Series 2: axial st · axial · 0.75mm/px · z∈[-690,-310]mm · 12 of 88 slices shown, 14 images]
[im 6/88  soft-tissue]
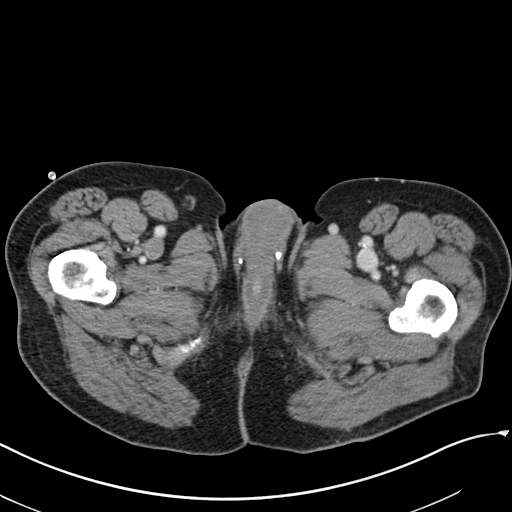
[im 6/88  bone]
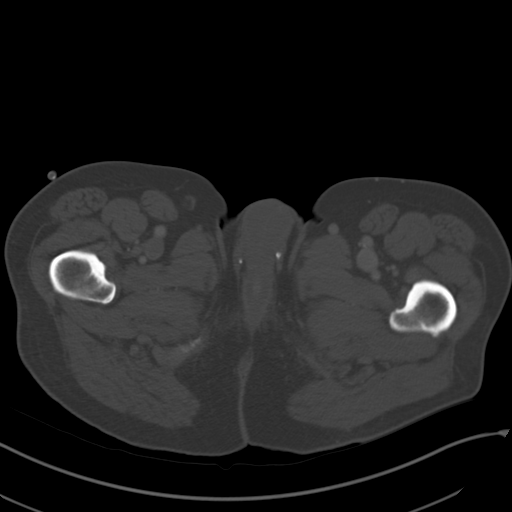
[im 12/88  soft-tissue]
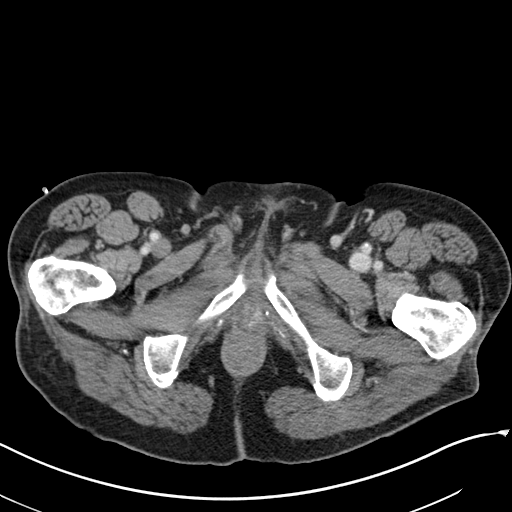
[im 18/88  soft-tissue]
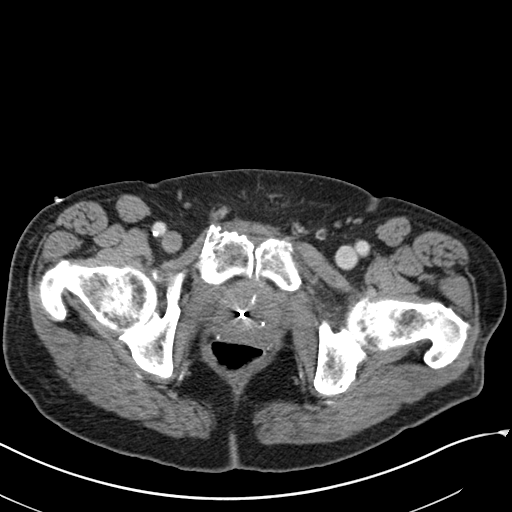
[im 30/88  soft-tissue]
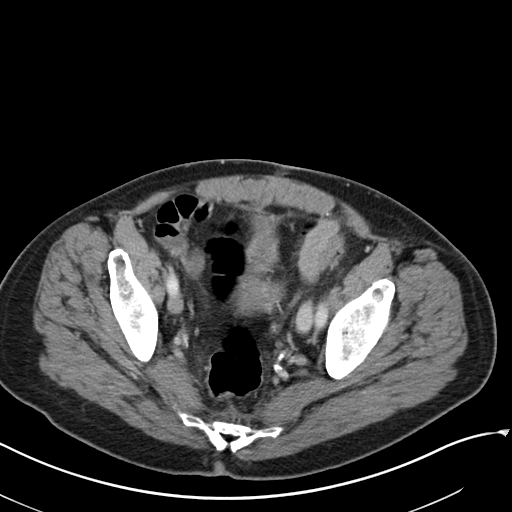
[im 35/88  soft-tissue]
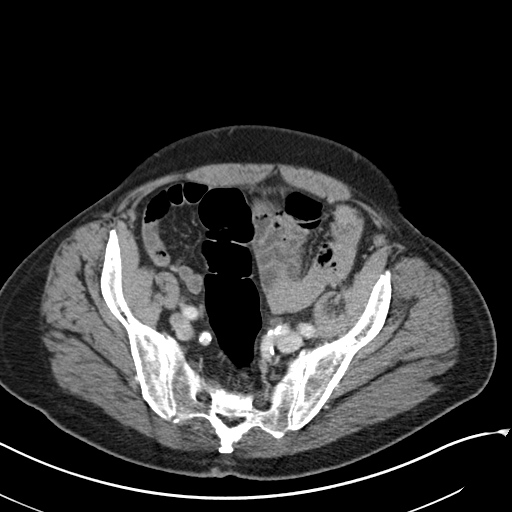
[im 41/88  soft-tissue]
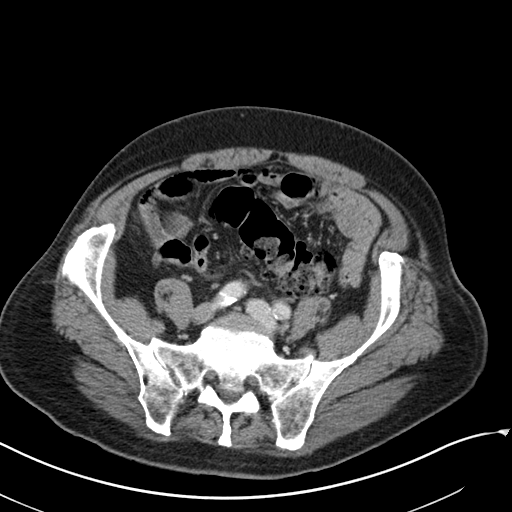
[im 47/88  soft-tissue]
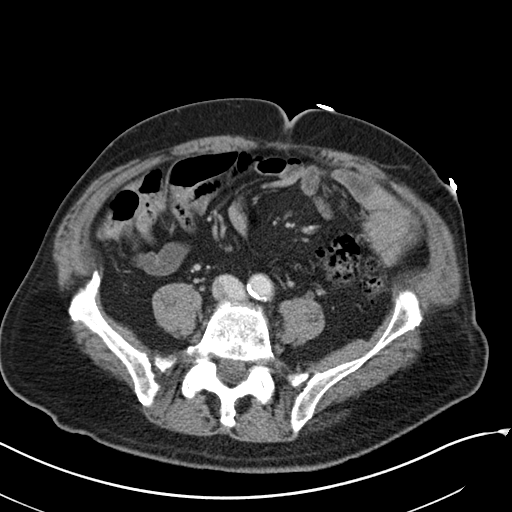
[im 53/88  soft-tissue]
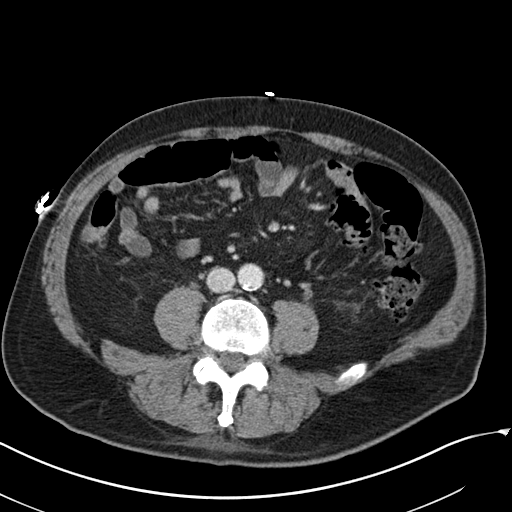
[im 59/88  soft-tissue]
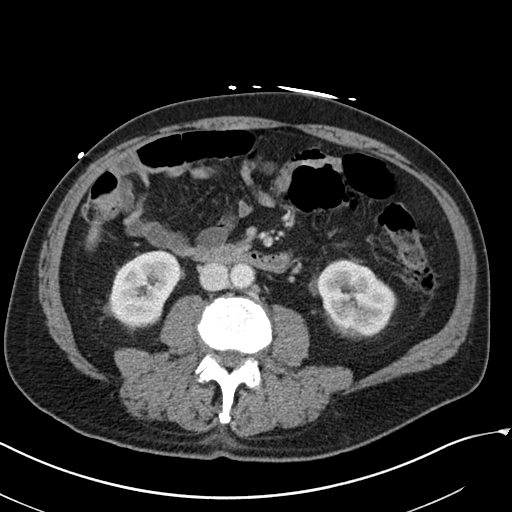
[im 59/88  bone]
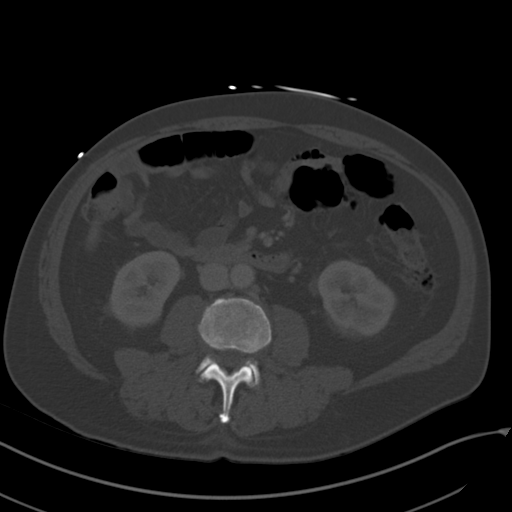
[im 70/88  soft-tissue]
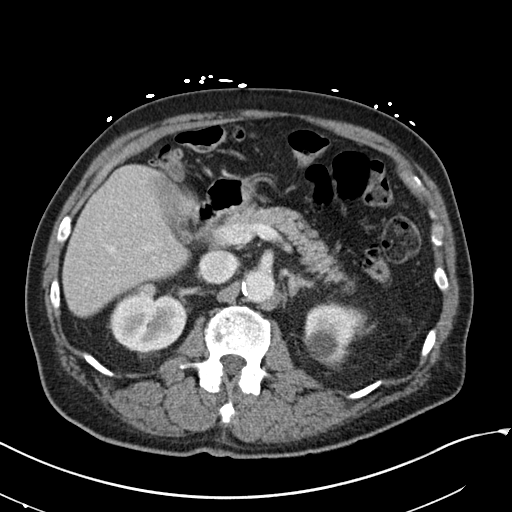
[im 76/88  soft-tissue]
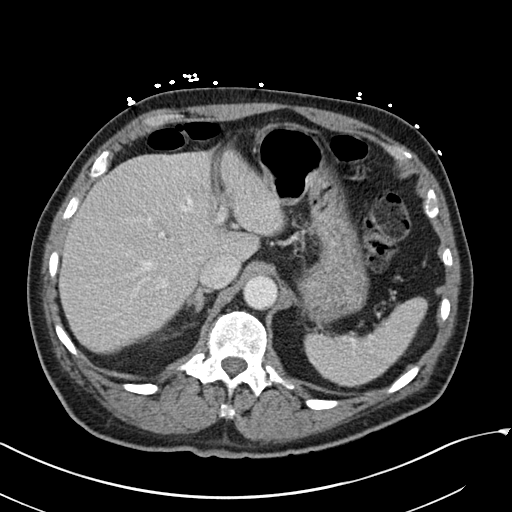
[im 82/88  soft-tissue]
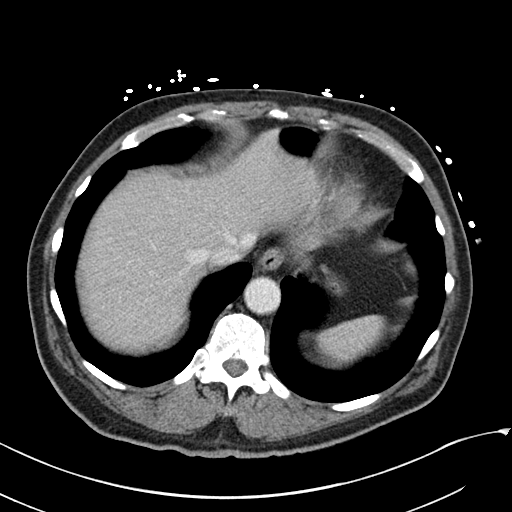

[Series 5: coronal st · coronal · 0.72mm/px · 3 of 151 slices shown]
[im 51/151  soft-tissue]
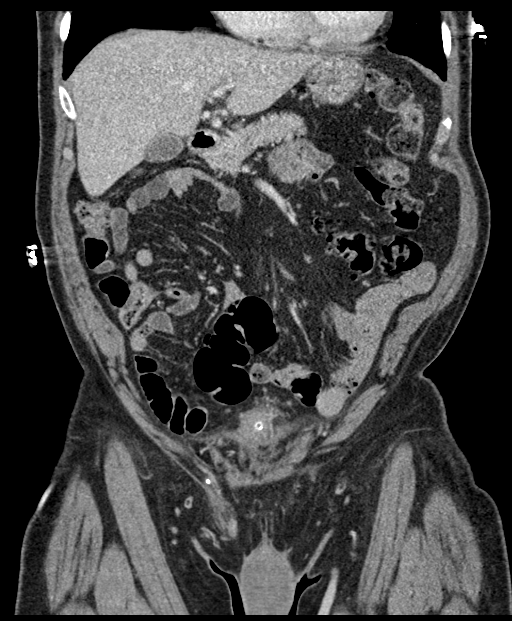
[im 67/151  soft-tissue]
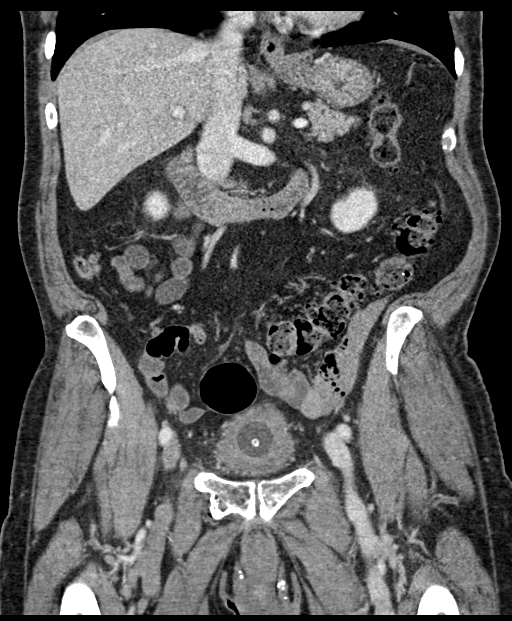
[im 84/151  soft-tissue]
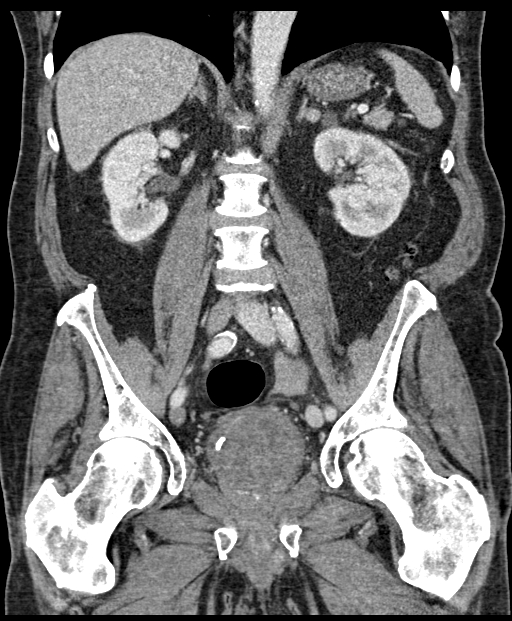

[15 of 46 positions shown; findings below may reference images not displayed]

RADIATION DOSE REDUCTION: This exam was performed according to the
departmental dose-optimization program which includes automated
exposure control, adjustment of the mA and/or kV according to
patient size and/or use of iterative reconstruction technique.

CONTRAST:  100mL OMNIPAQUE IOHEXOL 300 MG/ML  SOLN
FINDINGS: Lower chest: Clear lung bases.

Hepatobiliary: No focal liver abnormality is seen. No gallstones,
gallbladder wall thickening, or biliary dilatation.

Pancreas: Unremarkable. No pancreatic ductal dilatation or
surrounding inflammatory changes.

Spleen: Normal in size without focal abnormality.

Adrenals/Urinary Tract: Small left adrenal nodule, 1.2 cm,
unchanged. Normal right adrenal gland.

Kidneys normal in size, orientation and position with symmetric
enhancement and excretion. Several bilateral low-attenuation renal
masses, largest on the right, posterior midpole, 3 cm, and largest
on the left, posteromedial mid to upper pole, 2.6 cm. Masses are
consistent with cysts. No intrarenal stones. No hydronephrosis. Mild
dilation of the left ureter. No ureteral stone. Right ureter normal
in course and in caliber.

Suprapubic Foley catheter lies within the bladder. Surgical vascular
clips lie along the right aspect of the decompressed bladder.
Possible ill-defined mass along the posterior aspect of the bladder,
approximately 5.3 cm transversely.

Stomach/Bowel: Normal stomach. Small bowel and colon are normal in
caliber. No wall thickening. No inflammation.

Vascular/Lymphatic: Aortic atherosclerosis. No aneurysm. No enlarged
lymph nodes.

Reproductive: Enlarged prostate, upper margin poorly defined.
Prostate bulging into the bladder base may account for the apparent
bladder mass. Prostate measures 5.3 cm transversely, containing
fiducial markers, unchanged from the prior CT.

Other: No abdominal wall hernia or abnormality. No abdominopelvic
ascites.

Musculoskeletal: No acute fracture.  No bone lesion.
IMPRESSION: 1. Bladder decompressed with a suprapubic Foley catheter, limiting
its assessment. Possible bladder mass versus enlarged prostate
bulging into the bladder base.
2. Mild dilation of the left ureter, without a ureteral stone. No
hydronephrosis.
3. Low-attenuation renal masses consistent with cysts. Small left
adrenal nodule stable from prior CT consistent with an adenoma.
4. Aortic atherosclerosis.

## 2023-11-11 IMAGING — DX DG ABDOMEN 1V
1 series · 2 of 2 positions shown · non-contrast
Comparison: Abdomen pelvis CT 12/09/2021

CLINICAL DATA: DVT.

EXAM:
ABDOMEN - 1 VIEW

[Series 1: abdomen · 0.14mm/px · 2 of 2 slices shown]
[im 1/2]
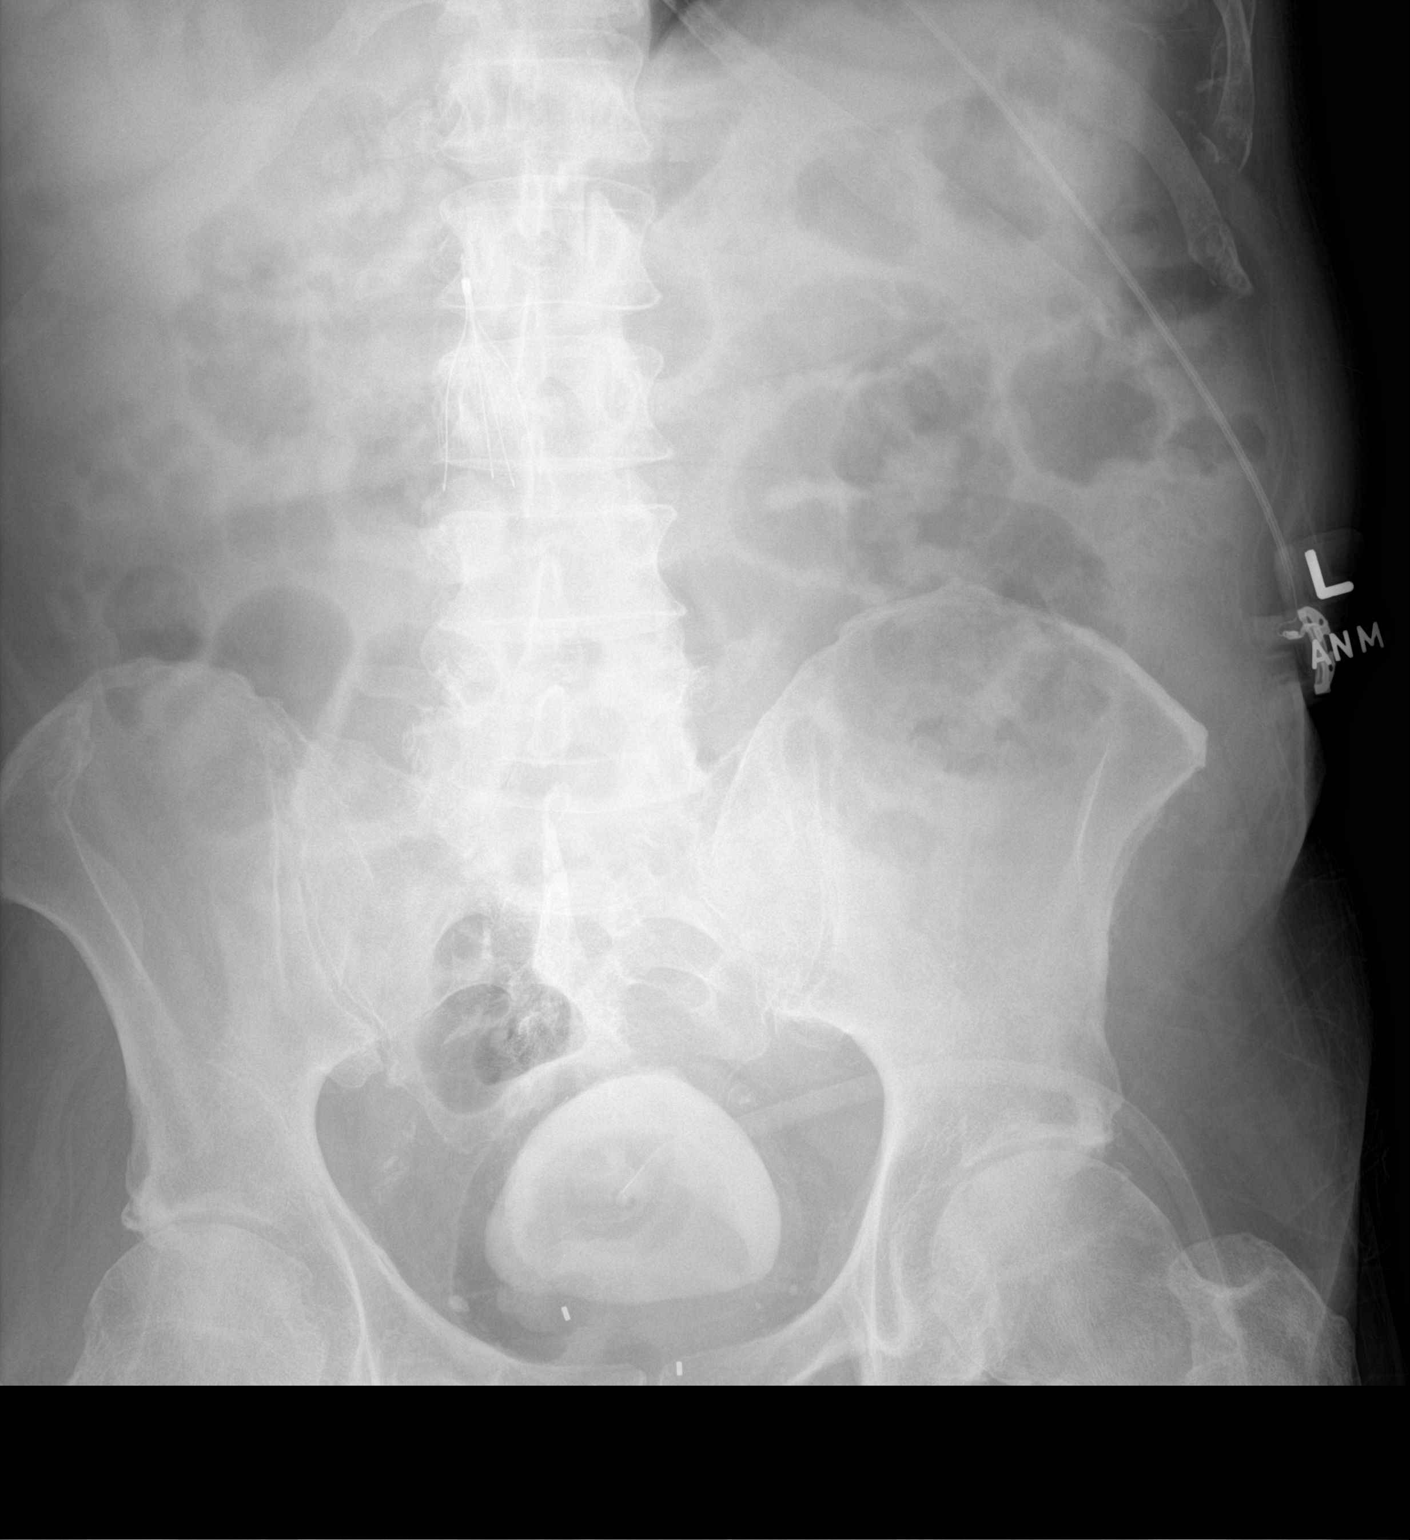
[im 2/2]
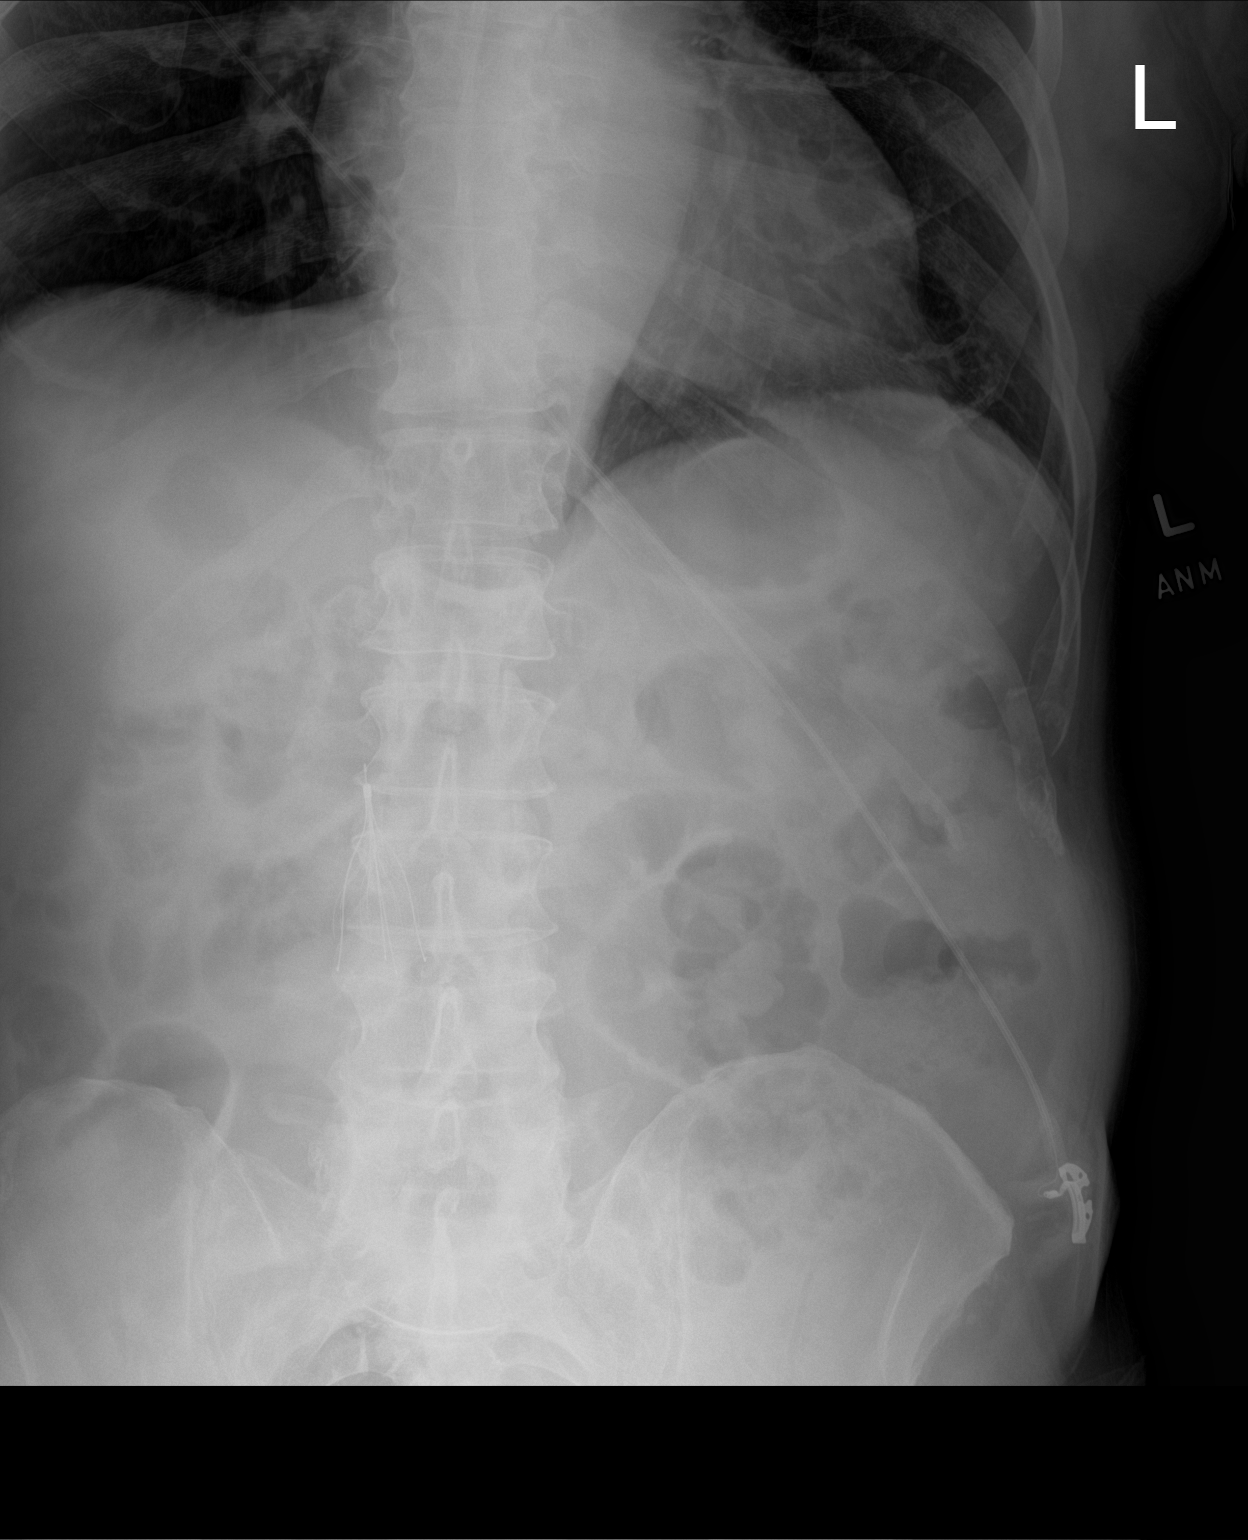

[2 of 2 positions shown; findings below may reference images not displayed]

FINDINGS: Nonspecific bowel gas pattern. IVC filter visualized in situ.
Suprapubic catheter noted with contrast material in the bladder
lumen.
IMPRESSION: Nonspecific bowel gas pattern.

## 2023-11-11 IMAGING — RF DG ABDOMEN 1V
1 series · 6 of 6 positions shown · non-contrast
Comparison: CT abdomen and pelvis 12/09/2021

FLUOROSCOPY:
Fluoroscopy Time: 37 seconds

Radiation Exposure Index: 8.3 mGy

CLINICAL DATA: IVC filter insertion.

EXAM:
ABDOMEN - 1 VIEW

[Series 1: unknown protocol · 0.20mm/px · 3 acquisitions, 6 frames shown]
[im 1/3]
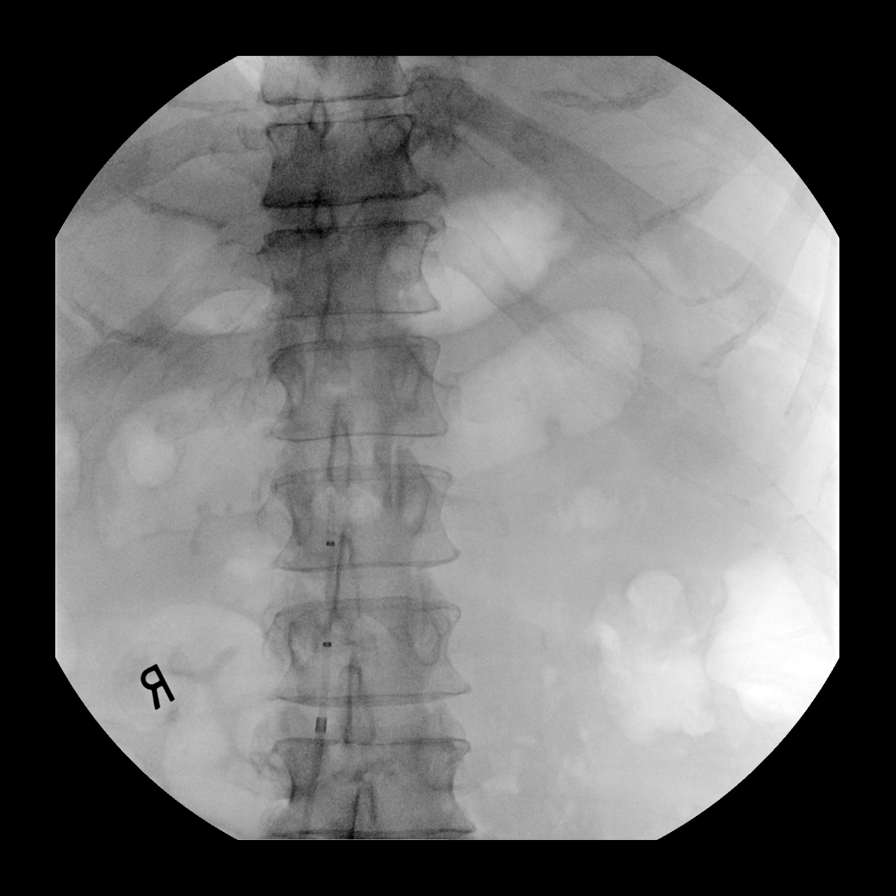
[im 1/3]
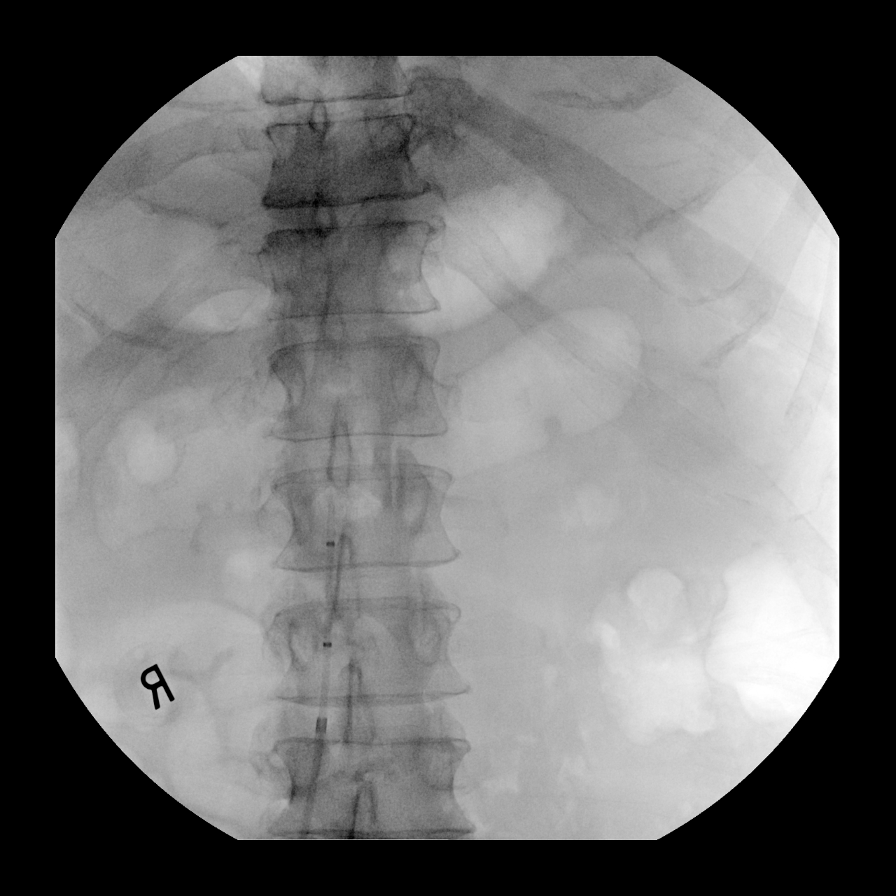
[im 1/3]
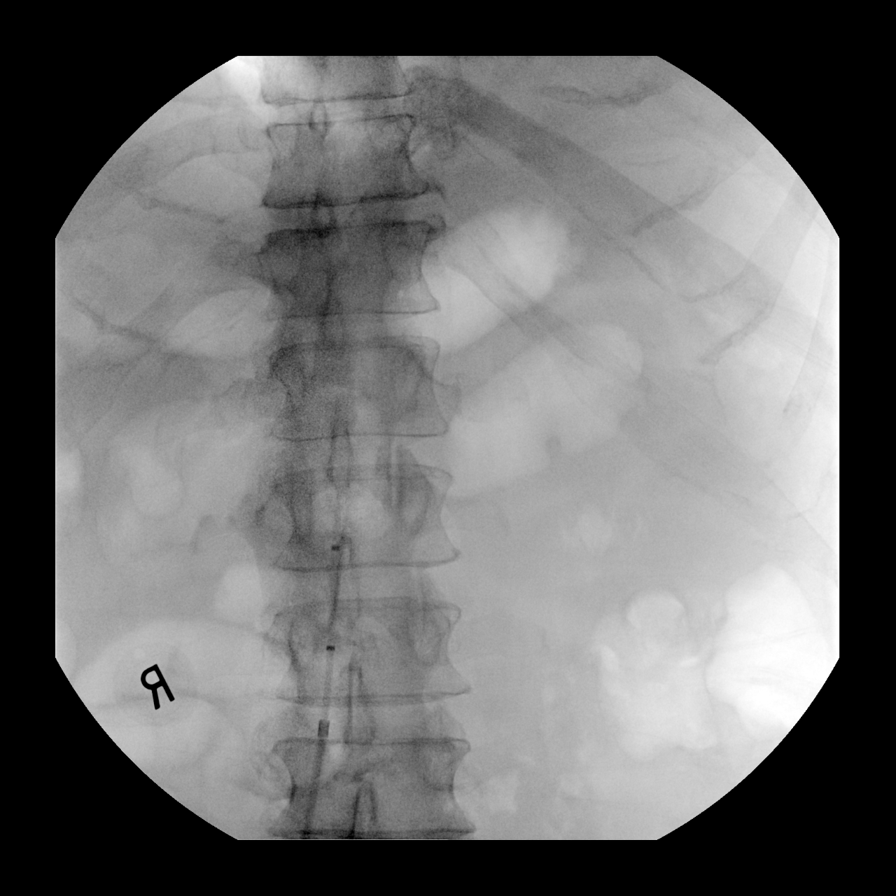
[im 1/3]
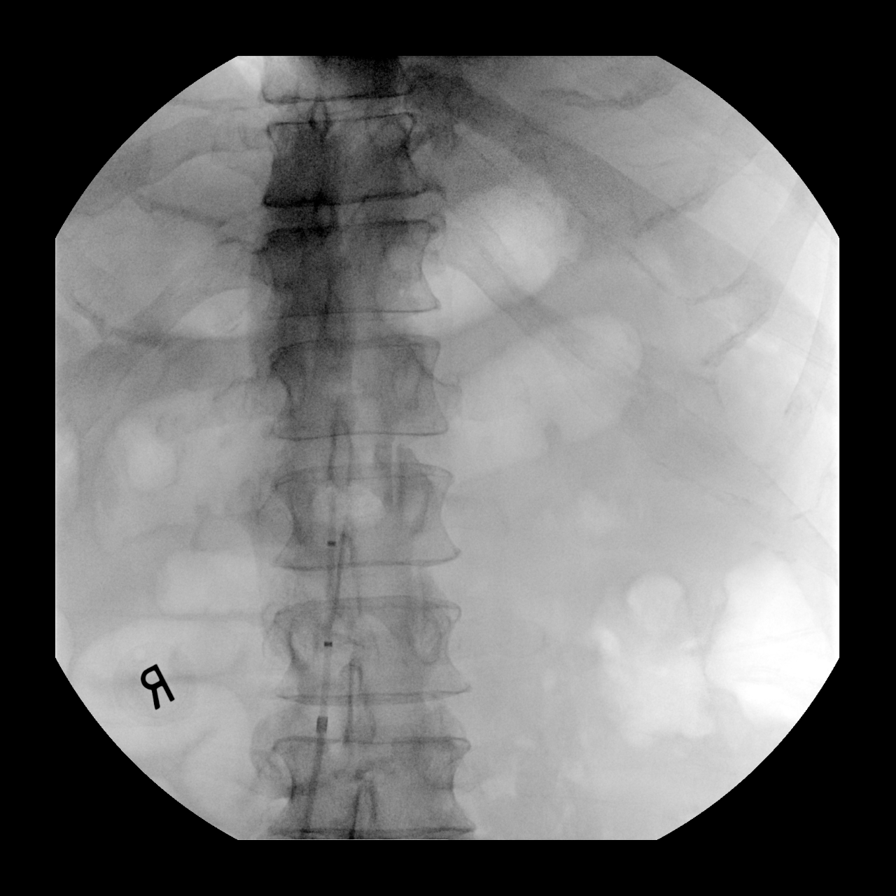
[im 2/3]
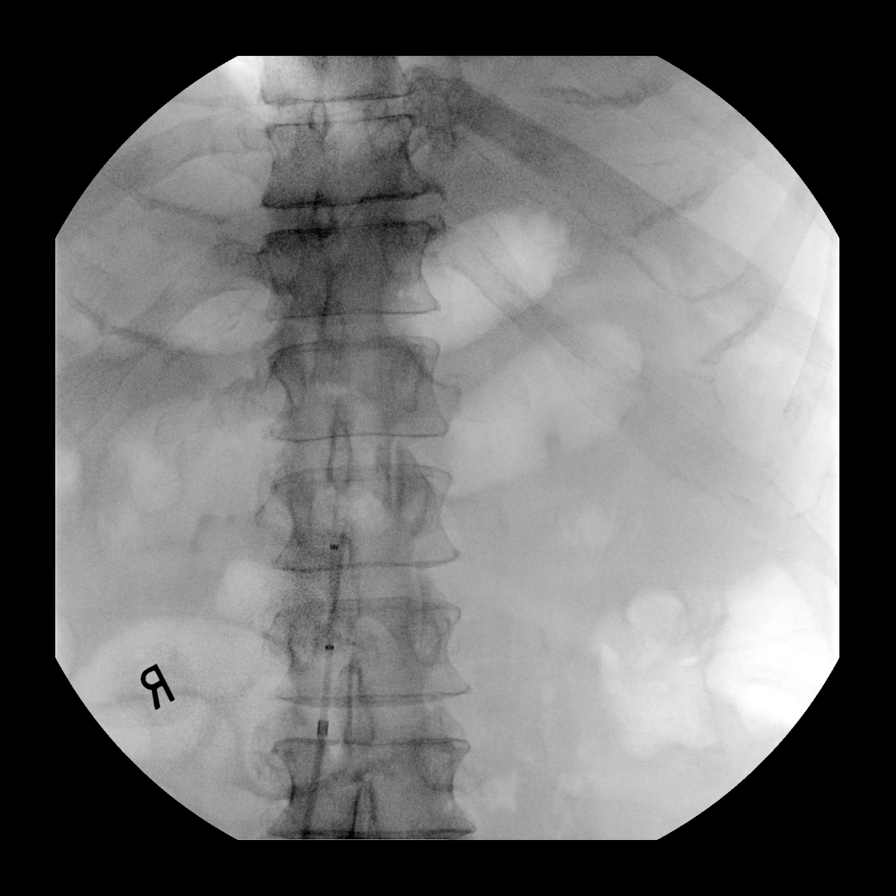
[im 3/3]
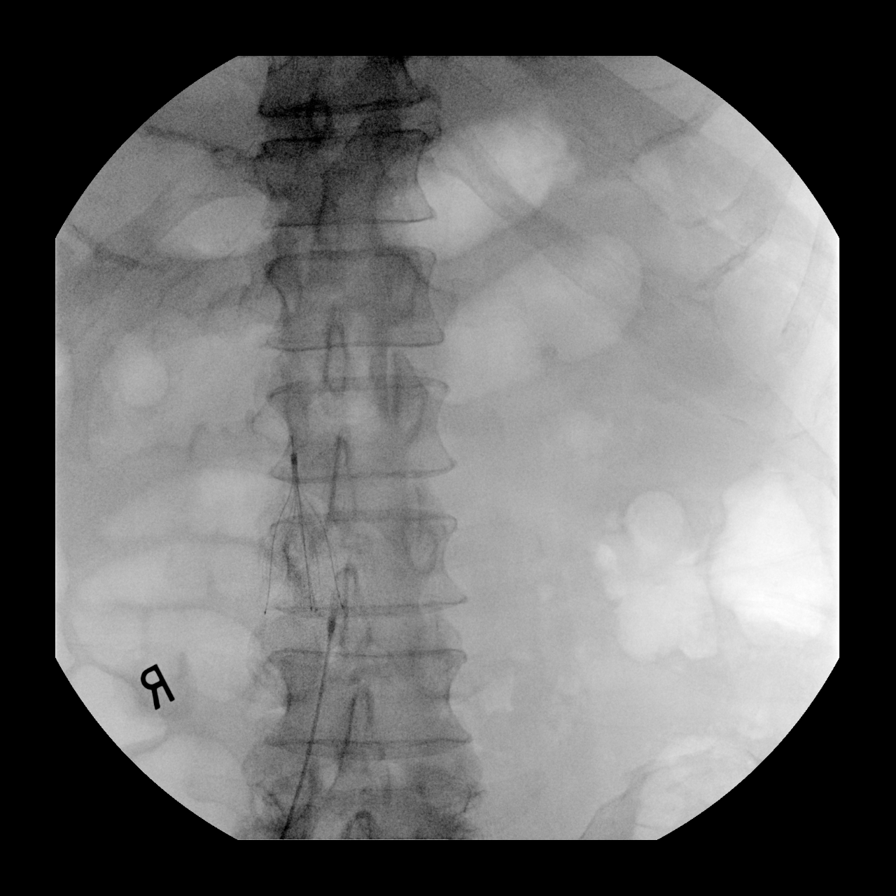

[6 of 6 positions shown; findings below may reference images not displayed]

FINDINGS: Intraoperative fluoroscopic images are submitted during IVC filter
insertion. The final image demonstrates the superior aspect of the
filter to be at the mid to lower L2 vertebral body level.
IMPRESSION: Intraoperative images during IVC filter placement.

## 2024-05-09 LAB — COLOGUARD: COLOGUARD: POSITIVE — AB
